# Patient Record
Sex: Female | Born: 1935
Health system: Southern US, Community
[De-identification: ages and names within clinical notes are randomized; demographics above are authoritative.]

## PROBLEM LIST (undated history)

## (undated) DIAGNOSIS — Z923 Personal history of irradiation: Secondary | ICD-10-CM

## (undated) DIAGNOSIS — E7800 Pure hypercholesterolemia, unspecified: Secondary | ICD-10-CM

## (undated) DIAGNOSIS — I10 Essential (primary) hypertension: Secondary | ICD-10-CM

## (undated) DIAGNOSIS — D649 Anemia, unspecified: Secondary | ICD-10-CM

## (undated) DIAGNOSIS — I5022 Chronic systolic (congestive) heart failure: Secondary | ICD-10-CM

## (undated) DIAGNOSIS — N200 Calculus of kidney: Secondary | ICD-10-CM

## (undated) DIAGNOSIS — K219 Gastro-esophageal reflux disease without esophagitis: Secondary | ICD-10-CM

## (undated) DIAGNOSIS — F419 Anxiety disorder, unspecified: Secondary | ICD-10-CM

## (undated) DIAGNOSIS — I340 Nonrheumatic mitral (valve) insufficiency: Secondary | ICD-10-CM

## (undated) DIAGNOSIS — M810 Age-related osteoporosis without current pathological fracture: Secondary | ICD-10-CM

## (undated) DIAGNOSIS — Z9889 Other specified postprocedural states: Secondary | ICD-10-CM

## (undated) DIAGNOSIS — R112 Nausea with vomiting, unspecified: Secondary | ICD-10-CM

## (undated) DIAGNOSIS — I219 Acute myocardial infarction, unspecified: Secondary | ICD-10-CM

## (undated) DIAGNOSIS — C50512 Malignant neoplasm of lower-outer quadrant of left female breast: Secondary | ICD-10-CM

## (undated) DIAGNOSIS — K279 Peptic ulcer, site unspecified, unspecified as acute or chronic, without hemorrhage or perforation: Secondary | ICD-10-CM

## (undated) DIAGNOSIS — N189 Chronic kidney disease, unspecified: Secondary | ICD-10-CM

## (undated) DIAGNOSIS — I2510 Atherosclerotic heart disease of native coronary artery without angina pectoris: Secondary | ICD-10-CM

## (undated) DIAGNOSIS — I4891 Unspecified atrial fibrillation: Secondary | ICD-10-CM

## (undated) HISTORY — DX: Essential (primary) hypertension: I10

## (undated) HISTORY — DX: Acute myocardial infarction, unspecified: I21.9

## (undated) HISTORY — DX: Pure hypercholesterolemia, unspecified: E78.00

## (undated) HISTORY — DX: Atherosclerotic heart disease of native coronary artery without angina pectoris: I25.10

## (undated) HISTORY — DX: Malignant neoplasm of lower-outer quadrant of left female breast: C50.512

## (undated) HISTORY — PX: CARDIAC CATHETERIZATION: SHX172

## (undated) HISTORY — DX: Age-related osteoporosis without current pathological fracture: M81.0

## (undated) HISTORY — DX: Anxiety disorder, unspecified: F41.9

## (undated) HISTORY — DX: Peptic ulcer, site unspecified, unspecified as acute or chronic, without hemorrhage or perforation: K27.9

## (undated) HISTORY — DX: Gastro-esophageal reflux disease without esophagitis: K21.9

---

## 2001-06-19 ENCOUNTER — Inpatient Hospital Stay (HOSPITAL_COMMUNITY): Admission: EM | Admit: 2001-06-19 | Discharge: 2001-06-21 | Payer: Self-pay | Admitting: Emergency Medicine

## 2001-06-19 ENCOUNTER — Encounter (INDEPENDENT_AMBULATORY_CARE_PROVIDER_SITE_OTHER): Payer: Self-pay | Admitting: Specialist

## 2001-06-19 ENCOUNTER — Encounter: Payer: Self-pay | Admitting: Emergency Medicine

## 2001-07-28 ENCOUNTER — Encounter: Payer: Self-pay | Admitting: Family Medicine

## 2001-07-28 ENCOUNTER — Encounter: Admission: RE | Admit: 2001-07-28 | Discharge: 2001-07-28 | Payer: Self-pay | Admitting: Family Medicine

## 2007-03-12 ENCOUNTER — Encounter: Admission: RE | Admit: 2007-03-12 | Discharge: 2007-03-12 | Payer: Medicare Other | Admitting: Family Medicine

## 2007-08-21 ENCOUNTER — Encounter: Payer: Self-pay | Admitting: Family Medicine

## 2007-08-21 ENCOUNTER — Ambulatory Visit (HOSPITAL_COMMUNITY): Admission: RE | Admit: 2007-08-21 | Discharge: 2007-08-21 | Payer: Self-pay | Admitting: Family Medicine

## 2007-08-21 ENCOUNTER — Ambulatory Visit: Payer: Self-pay | Admitting: Surgery

## 2008-04-19 ENCOUNTER — Encounter: Admission: RE | Admit: 2008-04-19 | Discharge: 2008-04-19 | Payer: Self-pay | Admitting: Gastroenterology

## 2010-10-20 NOTE — Procedures (Signed)
[place]. [facility]  Patient:    [name], [name] Visit Number: [id number] MRN: [id number]          Service Type: MED Location: [phone] Attending Physician:  Suszanne Conners Dictated by:   Jeryl Columbia, M.D. Proc. Date: 06/20/01 Admit Date:  06/19/2001 Discharge Date: 06/21/2001   CC:         Suszanne Conners, M.D.                           Procedure Report  PROCEDURE PERFORMED:  Esophagogastroduodenoscopy with biopsies.  ENDOSCOPIST:  Jeryl Columbia, M.D.  INDICATIONS FOR PROCEDURE:  Gastrointestinal bleeding, nondiagnostic colonoscopy.  Consent was signed prior to any premeds given after risks, benefits, methods, and options were thoroughly discussed with the patient and multiple family members.  ADDITIONAL MEDICINES FOR THIS PROCEDURE:  2 mg of Versed only.  DESCRIPTION OF PROCEDURE:  Video endoscope inserted by direct vision.  The esophagus was normal.  The esophagus was normal.  In the distal esophagus was a small hiatal hernia.  The scope passed into the stomach and advanced through a normal pylorus into the duodenum, all of which was deformed.  There was scarring from previous ulcer diseases and one linear active ulcer and possibly a small shallow one as well.  The scope passed easily around the C-loop to a normal second portion of the duodenum.  No blood was seen distally.  The scope was withdrawn back to the bulb and a good look there confirmed the above findings.  The scope was withdrawn back to the stomach and retroflexed.  High in the cardia, a hiatal hernia was confirmed.  The fundus, angularis, lesser and greater curve were all normal except for some mild gastritis.  The scope was straightened and straight visualization of the stomach did not reveal any additional findings except for some minimal antritis as well.  One biopsy of the antrum or the CLO test was obtained to rule out Helicobacter pylori.  The scope was then  slowly withdrawn again confirming the normal esophagus.  The scope was removed, the patient tolerated the procedure well.  There was no obvious immediate complication.  ENDOSCOPIC DIAGNOSIS: 1. Small hiatal hernia. 2. Minimal antritis, status post CLO biopsy, rule out Helicobacter pylori. 3. Deformed pylorus and bulb from previous ulcer disease with questionable    shallow active and linear and ulcers currently. 4. No blood seen on exam to a normal second portion of the duodenum.  PLAN: 1. Treat CLO if positive. 2. No aspirin or nonsteroidals long term. 3. Three months of pump inhibitors.  Happy to see back p.r.n. to recheck    CBCs, guaiacs and determine if small bowel follow-through is next which    I would do.  Would slowly advance diet.  Dr. Wynetta Emery is on call for me    this week.  Call for any questions or problems but hopefully the patient    can be discharged soon if CBC is stable and no signs of active bleeding. Dictated by:   Jeryl Columbia, M.D. Attending Physician:  Suszanne Conners DD:  06/20/01 TD:  06/23/01 Job: [id number]

## 2010-10-20 NOTE — Consult Note (Signed)
[place]. [facility]  Patient:    [name], [name] Visit Number: [id number] MRN: [id number]          Service Type: MED Location: [phone] Attending Physician:  Suszanne Conners Dictated by:   Jeryl Columbia, M.D. Proc. Date: 06/19/01 Admit Date:  06/19/2001 Discharge Date: 06/21/2001   CC:         Jeryl Columbia, M.D.  Suszanne Conners, M.D.   Consultation Report  REASON FOR CONSULTATION:  The patient was seen at the request of Dr. Suszanne Conners for guaiac positivity anemia.  She has seen some dark stools for about a week and has had increased weakness and dizziness.  She presented to the emergency room and found not only the blood but to have a hemoglobin of 6.7 and was admitted for transfusion and further workup and plan.  Other than some chronic constipation, she has really no GI symptoms.  She has not had any previous ulcers or any previous GI workup.  She will take an occasional aspirin but no other nonsteroidals.  PAST MEDICAL HISTORY:  Pertinent for some history of increased blood pressure in the past but no abdominal surgeries or previous problems.  MEDICATIONS:  None.  SOCIAL HISTORY:  Does not smoke or drink.  Does use some over-the-counter natural products as well as some periodic aspirins.  FAMILY HISTORY:  Negative for any ulcers, colon cancer, colon polyps.  ALLERGIES:  No medical allergies.  REVIEW OF SYSTEMS:  Pertinent for her having a long history of anemia.  No sickle cel or other causes of anemia in the family.  Initially thought possibly due to heavy periods thought possibly she had before but no recent blood donations or CBCs.  PHYSICAL EXAMINATION:  GENERAL:  No acute distress.  ABDOMEN:  Exam pertinent for abdomen being nontender and stool guaiac positive.  LABORATORY DATA:  BUN of 20.  Coagulases were okay.  MCV was normal.  Albumin 3.0.  Liver tests normal.  ASSESSMENT:  Gastrointestinal bleed and  anemia of questionable etiology.  PLAN:  Risks, benefits, methods, and options of colonoscopy and endoscopy versus upper GI and lower GI were discussed with the patient and the family. We will proceed tomorrow unless needed sooner p.r.n.  In the meantime, we will continue clear liquids.  Prep as written.  Agree with Protonix in the meantime.  Obviously, if nothing is found on these exams, we will need a small-bowel follow-through to be sure.Dictated by:   Jeryl Columbia, M.D.  Attending Physician:  Suszanne Conners DD:  06/19/01 TD:  06/21/01 Job: [id number]

## 2010-10-20 NOTE — Procedures (Signed)
[place]. [facility]  Patient:    DANETTA, ROXBURGH Visit Number: [id number] MRN: [id number]          Service Type: MED Location: [phone] Attending Physician:  Suszanne Conners Dictated by:   Jeryl Columbia, M.D. Proc. Date: 06/20/01 Admit Date:  06/19/2001 Discharge Date: 06/21/2001   CC:         Suszanne Conners, M.D.   Procedure Report  PROCEDURE: Colonoscopy with biopsy.  INDICATION:  Anemia, guaiac positivity.  Consent was signed after risks, benefits, methods, and options thoroughly discussed with the patient and most of the family members.  MEDICATIONS:  Demerol 60 mg, Versed 6 mg.  DESCRIPTION OF PROCEDURE:  Rectal inspection was pertinent for external hemorrhoids, small.  Digital exam was negative.  The pediatric video adjustable colonoscope was inserted and with moderate difficulty due to a tortuous, looping colon was able to be advanced to the cecum.  This required rolling her on her back and multiple abdominal pressures.  The cecum was identified by the appendiceal orifice and the ileocecal valve.  No signs of bleeding were seen on insertion.  The scope was inserted a short way into the terminal ileum, which was normal.  Photo documentation was obtained, and no blood was seen coming from above.  On insertion no abnormality was seen.  The scope was slowly withdrawn.  The prep was adequate.  There was some liquid stool that required washing and suctioning.  On slow withdrawal through the colon, the cecum, ascending, and transverse were normal.  In the mid-descending, a tiny questionable polyp was seen and was cold biopsied x 2. The scope was slowly withdrawn.  There were some rare early left-sided diverticula.  In the distal sigmoid, two tiny hyperplastic-appearing polyps were seen and were also cold biopsied x 1 and put in the same container.  The scope was withdrawn back to the rectum and retroflexed, pertinent for some small  internal hemorrhoids.  The scope was straightened, air was suctioned, and the scope removed.  The patient tolerated the procedure adequately.  There was no obvious immediate complication.  ENDOSCOPIC DIAGNOSES: 1. Internal-external hemorrhoids. 2. Rare early left-sided diverticula. 3. Questionable hyperplastic-appearing distal sigmoid polyps, cold biopsied. 4. Tiny to small mid-descending polyp, cold biopsied. 5. Otherwise within normal limits to the cecum, a quick look in the terminal    ileum without any blood seen.  PLAN:  Await pathology to determine future colonic screening and otherwise continue workup with an EGD. Dictated by:   Jeryl Columbia, M.D. Attending Physician:  Suszanne Conners DD:  06/20/01 TD:  06/23/01 Job: [id number]

## 2010-10-20 NOTE — Discharge Summary (Signed)
[place]. [facility]  Patient:    [name], [name] Visit Number: [id number] MRN: [id number]          Service Type: MED Location: [phone] Attending Physician:  Suszanne Conners Dictated by:   Sharene Butters, M.D. Admit Date:  06/19/2001 Discharge Date: 06/21/2001   CC:         Jeryl Columbia, M.D.  Suszanne Conners, M.D.  Don Broach. Carlis Abbott, M.D.   Discharge Summary  DISCHARGE DIAGNOSES: 1. Gastrointestinal bleed. 2. Endoscopy demonstrated old peptic ulcer disease. 3. Colonic polyps. 4. History of high blood pressure. 5. History of hormone replacement therapy.  HISTORY OF PRESENT ILLNESS:  [name] is a 76 year old black married female former patient of Dr. Jeanann Lewandowsky admitted for spells of rapid heartbeat and weakness in the chest for four days occurring with movement and chest sensation with lifting.  Denied shortness of breath, dizziness, headache. Stools were darker on the week of admission.  Patient attributing to restart of multivitamins.  Has been more constipated for months but no melena until the week of admission.  No nausea, reflux symptoms.  Seen in Urgent Care January 14 with allegedly normal EKG.  No other work-up done.  PAST MEDICAL HISTORY: 1. Hypertension in the past.  Briefly on ______ through Dr. Carlis Abbott.  Not seen    by Dr. Carlis Abbott in three years. 2. G4, P4, menopausal x eight years.  Took HRT briefly.  Discontinued five    years prior to admission. 3. Benign breast biopsy.  MEDICATIONS:  Low dose ASA recommended by Urgent Care.  Multiple natural products.  Denies NSAIDs.  MEDICATION ALLERGIES:  None.  FAMILY HISTORY:  Father deceased of CVA age 72.  Mother died of cancer of the pancreas.  SOCIAL HISTORY:  Former [employer].  Does not drink alcohol or smoke cigarettes.  Lives with husband.  Grown children.  Minimal caffeine.  REVIEW OF SYSTEMS:  No recent weight or appetite change, night  sweats, abdominal or chest pain.  PHYSICAL EXAMINATION:  See admission History and Physical.  LABORATORY DATA:  Admission hemoglobin 6.7, white count 7.  ABG stable.  TSH 5, then 1.9.  H. pylori pending.  CPK with MB negative x 3.  Glucose 134, then 120.  Clotting function normal.  Folic 13 which is normal.  B-12 normal, 367.  RADIOGRAPHIC DATA:  Chest x-ray suggested COPD.  EKG:  Normal sinus rhythm.  T wave abnormality consistent with ischemia (later thought secondary to anemia).  HOSPITAL COURSE:  [name] was admitted for evaluation and treatment of her cardiac symptoms and anemia thought secondary to a GI bleed.  She received two units of packed cells on admission and was begun on Protonix.  She underwent two procedures:  EGD, showing old bulb ulcer by Dr. Watt Climes, and colonoscopy, demonstrating polyps.  Diet was advanced after procedures and she was able to eat and ambulate without dizziness.  PROCEDURES: 1. Endoscopy. 2. Colonoscopy.  DISPOSITION:  She was discharged in satisfactory condition to the care of her husband and grown children on Protonix 40 and Chromagen.  Pending at the time of discharge was H. pylori.  If positive, ATB would be added for the last two weeks of her Protonix therapy.  FOLLOW-UP:  With Dr. Shelbie Proctor seven to 10 days after admission, with Dr. Watt Climes one month after admission.  The family was also concerned about findings on chest x-ray with relatively minimal symptoms.  Will need repeat.  DISCHARGE MEDICATIONS:  Protonix  and Chromagen. Dictated by:   Sharene Butters, M.D. Attending Physician:  Suszanne Conners DD:  06/21/01 TD:  06/23/01 Job: [id number]

## 2011-02-01 ENCOUNTER — Emergency Department (HOSPITAL_COMMUNITY): Payer: Medicare Other

## 2011-02-01 ENCOUNTER — Encounter: Payer: Self-pay | Admitting: Family Medicine

## 2011-02-01 ENCOUNTER — Observation Stay (HOSPITAL_COMMUNITY)
Admission: EM | Admit: 2011-02-01 | Discharge: 2011-02-02 | Disposition: A | Discharge status: Home / Self Care | Payer: Medicare Other | Source: Ambulatory Visit | Attending: Family Medicine | Admitting: Family Medicine

## 2011-02-01 DIAGNOSIS — K219 Gastro-esophageal reflux disease without esophagitis: Secondary | ICD-10-CM | POA: Insufficient documentation

## 2011-02-01 DIAGNOSIS — R0789 Other chest pain: Secondary | ICD-10-CM

## 2011-02-01 DIAGNOSIS — I10 Essential (primary) hypertension: Secondary | ICD-10-CM | POA: Insufficient documentation

## 2011-02-01 DIAGNOSIS — D649 Anemia, unspecified: Secondary | ICD-10-CM | POA: Insufficient documentation

## 2011-02-01 LAB — LIPID PANEL
Cholesterol: 251 mg/dL — ABNORMAL HIGH (ref 0–200)
HDL: 65 mg/dL (ref >39)
Total CHOL/HDL Ratio: 3.9 RATIO
Triglycerides: 94 mg/dL (ref <150)

## 2011-02-01 LAB — POCT I-STAT TROPONIN I: Troponin i, poc: 0.01 ng/mL (ref 0.00–0.08)

## 2011-02-01 LAB — CARDIAC PANEL(CRET KIN+CKTOT+MB+TROPI)
CK, MB: 3.2 ng/mL (ref 0.3–4.0)
Relative Index: 1.9 (ref 0.0–2.5)
Total CK: 171 U/L (ref 7–177)

## 2011-02-01 LAB — CBC
HCT: 34.2 % — ABNORMAL LOW (ref 36.0–46.0)
MCH: 29.2 pg (ref 26.0–34.0)
MCV: 88.4 fL (ref 78.0–100.0)
Platelets: 190 10*3/uL (ref 150–400)
RBC: 3.87 MIL/uL (ref 3.87–5.11)
WBC: 9.5 10*3/uL (ref 4.0–10.5)

## 2011-02-01 LAB — URINALYSIS, ROUTINE W REFLEX MICROSCOPIC
Bilirubin Urine: NEGATIVE
Glucose, UA: NEGATIVE mg/dL
Hgb urine dipstick: NEGATIVE
Specific Gravity, Urine: 1.008 (ref 1.005–1.030)
Urobilinogen, UA: 0.2 mg/dL (ref 0.0–1.0)
pH: 8 (ref 5.0–8.0)

## 2011-02-01 LAB — BASIC METABOLIC PANEL
BUN: 11 mg/dL (ref 6–23)
CO2: 26 mEq/L (ref 19–32)
Glucose, Bld: 93 mg/dL (ref 70–99)
Potassium: 3.5 mEq/L (ref 3.5–5.1)
Sodium: 142 mEq/L (ref 135–145)

## 2011-02-01 LAB — DIFFERENTIAL
Eosinophils Absolute: 0.1 10*3/uL (ref 0.0–0.7)
Eosinophils Relative: 1 % (ref 0–5)
Lymphocytes Relative: 16 % (ref 12–46)
Lymphs Abs: 1.6 10*3/uL (ref 0.7–4.0)
Monocytes Relative: 10 % (ref 3–12)
Neutrophils Relative %: 72 % (ref 43–77)

## 2011-02-01 LAB — CK TOTAL AND CKMB (NOT AT ARMC): Total CK: 161 U/L (ref 7–177)

## 2011-02-01 LAB — TROPONIN I: Troponin I: <0.3 ng/mL (ref <0.30)

## 2011-02-01 NOTE — H&P (Signed)
[facility] Admission History and Physical  Patient name: [name] record number: [id number] Date of birth: [birth year] Age: 76 y.o. Gender: female  Primary Care Provider: Hayden Rasmussen., MD  Chief Complaint: chest pain History of Present Illness: [name] is a 76 y.o. year old female with h/o hypertension who presents with complaint of chest pain that occurred yesterday at [facility]. She experienced some "gas pain" in the epigastric region after eating. Pain radiated to her throat. In addition to this, she also describes having some chest pain with breathing. This all lasted until 11pm. She took four aspirins (81mg ), gas X and prilosec, with relief of symptoms. She describes waking up this morning and feeling soreness in the chest area. She went to [place] to see Dr. Darron Doom, where an EKG showed T wave depression, new compared to an old EKG.  She denies any nausea or vomiting associated with the chest pain. No shortness of breath or difficulty breathing. Denies any diaphoresis, fevers or chills. Denies any trauma to the chest area.   Past Medical History: - Hypertension - hyperlipidemia Past Surgical History: - none Social History: Pt is retired. Has four children who live in the area.  Denies any tobacco use, alcohol or illicit drug use  Family History: Father: died of stroke Hypertension in family Allergies: NKDA  Medications:  - fish oil - garlic tabs - multivitamins  Review Of Systems: Per HPI with the following additions: none Otherwise 12 point review of systems was performed and was unremarkable.  Physical Exam: Pulse: 74  Blood Pressure: 177/76 RR: 16   O2:100  on RA Temp: 98.6  General: alert, cooperative and no distress HEENT: PERRLA, extra ocular movement intact and oropharynx clear, no lesions Heart: S1, S2 normal, no murmur, rub or gallop, regular rate and rhythm, no tenderness to palpation of chest. Lungs: clear to  auscultation, no wheezes or rales and unlabored breathing Abdomen: abdomen is soft without significant tenderness, masses, organomegaly or guarding. No epigastric tenderness to palpation.  Extremities: extremities normal, atraumatic, no cyanosis or edema Skin:no rashes Neurology: normal without focal findings, mental status, speech normal, alert and oriented x3 and PERLA  Labs and Imaging: Lab Results  Component Value Date/Time   NA 142 02/01/2011 12:15 PM   K 3.5 02/01/2011 12:15 PM   CL 107 02/01/2011 12:15 PM   CO2 26 02/01/2011 12:15 PM   BUN 11 02/01/2011 12:15 PM   CREATININE 0.86 02/01/2011 12:15 PM   GLUCOSE 93 02/01/2011 12:15 PM  Ca: 10.3 Lab Results  Component Value Date   WBC 9.5 02/01/2011   HGB 11.3* 02/01/2011   HCT 34.2* 02/01/2011   MCV 88.4 02/01/2011   PLT 190 02/01/2011   EKG: LVH, T wave depression in V3, V4, V5 CXR: no acute pulmonary process. Coarse calcifications over liver unchanged from previous CT scan.  POC troponin: 0.03 and 0.01 2 hours apart. UA: sg: 1.008, wnl.    Assessment and Plan: [name] is a 76 y.o. year old female presenting with chest pain. 1. Chest pain: atypical in nature. Will rule out ACS with three sets of cardiac enzymes and a repeat EKG in the morning. Start aspirin 81mg  daily. Pleuritic nature of chest pain could have suggested pneumonia or pericarditis, but EKG changes are not consistent with pericarditis and chest X-ray did not show any acute process such as pneumonia. There could also be a component of GERD with the chest pain and we will start protonix.  Will get risk stratification labs: lipid profile, HbA1C and TSH. 2. Hypertension: patient denies taking any hypertensive medications. BP's in the [id number] systolic. Will put patient on metoprolol 2.5mg  IV [id number] for a systolic above [id number] for now.  2. FEN/GI: low sodium diet 3. Prophylaxis: heparin 5000u tid 4. Disposition: will admit to tele floor and await further improvement.

## 2011-02-01 NOTE — H&P (Signed)
[name] is an 76 y.o. female.   Chief Complaint: chest pain HPI: 76 yo F with PMH of hypertension and questionable hyperlipidemia who presents with 1 day history of chest pain.  Patient states she began having midsternal burning the evening prior to admission.  This lasted until she went to bed, was able to sleep well.  Took 4 ASA, Gax-X, and Prilosec prior to retiring for the night.  Awoke this AM without any further pain, went to PCP to be evaluated where EKG showed new ST depression and flipped T waves in several leads.  Sent to ED for further workup and possible admission.  She denies any chest pain since yesterday evening.  No shortness of breath, nausea, vomiting, dyspnea, fevers or chills.  Does not smoke nor have diabetes.  No family history of cardiac issues.  For PMH, Social, Family, Medications please see excellent R1 note.       No current facility-administered medications on file as of 02/01/2011.   No current outpatient prescriptions on file as of 02/01/2011.    Results for orders placed during the hospital encounter of 02/01/11 (from the past 48 hour(s))  URINALYSIS, ROUTINE W REFLEX MICROSCOPIC     Status: Normal   Collection Time   02/01/11 11:58 AM      Component Value Range Comment   Color, Urine YELLOW  YELLOW     Appearance CLEAR  CLEAR     Specific Gravity, Urine 1.008  1.005 - 1.030     pH 8.0  5.0 - 8.0     Glucose, UA NEGATIVE  NEGATIVE (mg/dL)    Hgb urine dipstick NEGATIVE  NEGATIVE     Bilirubin Urine NEGATIVE  NEGATIVE     Ketones, ur NEGATIVE  NEGATIVE (mg/dL)    Protein, ur NEGATIVE  NEGATIVE (mg/dL)    Urobilinogen, UA 0.2  0.0 - 1.0 (mg/dL)    Nitrite NEGATIVE  NEGATIVE     Leukocytes, UA NEGATIVE  NEGATIVE  MICROSCOPIC NOT DONE ON URINES WITH NEGATIVE PROTEIN, BLOOD, LEUKOCYTES, NITRITE, OR GLUCOSE <1000 mg/dL.  DIFFERENTIAL     Status: Normal   Collection Time   02/01/11 12:15 PM      Component Value Range Comment   Neutrophils Relative 72  43 -  77 (%)    Neutro Abs 6.8  1.7 - 7.7 (K/uL)    Lymphocytes Relative 16  12 - 46 (%)    Lymphs Abs 1.6  0.7 - 4.0 (K/uL)    Monocytes Relative 10  3 - 12 (%)    Monocytes Absolute 1.0  0.1 - 1.0 (K/uL)    Eosinophils Relative 1  0 - 5 (%)    Eosinophils Absolute 0.1  0.0 - 0.7 (K/uL)    Basophils Relative 0  0 - 1 (%)    Basophils Absolute 0.0  0.0 - 0.1 (K/uL)   CBC     Status: Abnormal   Collection Time   02/01/11 12:15 PM      Component Value Range Comment   WBC 9.5  4.0 - 10.5 (K/uL)    RBC 3.87  3.87 - 5.11 (MIL/uL)    Hemoglobin 11.3 (*) 12.0 - 15.0 (g/dL)    HCT 34.2 (*) 36.0 - 46.0 (%)    MCV 88.4  78.0 - 100.0 (fL)    MCH 29.2  26.0 - 34.0 (pg)    MCHC 33.0  30.0 - 36.0 (g/dL)    RDW 13.0  11.5 - 15.5 (%)  Platelets 190  150 - 400 (K/uL)   BASIC METABOLIC PANEL     Status: Normal   Collection Time   02/01/11 12:15 PM      Component Value Range Comment   Sodium 142  135 - 145 (mEq/L)    Potassium 3.5  3.5 - 5.1 (mEq/L)    Chloride 107  96 - 112 (mEq/L)    CO2 26  19 - 32 (mEq/L)    Glucose, Bld 93  70 - 99 (mg/dL)    BUN 11  6 - 23 (mg/dL)    Creatinine, Ser 0.86  0.50 - 1.10 (mg/dL)    Calcium 10.3  8.4 - 10.5 (mg/dL)    GFR calc non Af Amer >60  >60 (mL/min)    GFR calc Af Amer >60  >60 (mL/min)   POCT I-STAT TROPONIN I     Status: Normal   Collection Time   02/01/11 12:25 PM      Component Value Range Comment   Troponin i, poc 0.03  0.00 - 0.08 (ng/mL)    Comment 3            POCT I-STAT TROPONIN I     Status: Normal   Collection Time   02/01/11  2:34 PM      Component Value Range Comment   Troponin i, poc 0.01  0.00 - 0.08 (ng/mL)    Comment 3            CK TOTAL AND CKMB     Status: Normal   Collection Time   02/01/11  4:04 PM      Component Value Range Comment   Total CK 161  7 - 177 (U/L)    CK, MB 3.6  0.3 - 4.0 (ng/mL)    Relative Index 2.2  0.0 - 2.5    TROPONIN I     Status: Normal   Collection Time   02/01/11  4:04 PM      Component Value Range  Comment   Troponin I <0.30  <0.30 (ng/mL)   LIPID PANEL     Status: Abnormal   Collection Time   02/01/11  4:09 PM      Component Value Range Comment   Cholesterol 251 (*) 0 - 200 (mg/dL)    Triglycerides 94  <150 (mg/dL)    HDL 65  >39 (mg/dL)    Total CHOL/HDL Ratio 3.9      VLDL 19  0 - 40 (mg/dL)    LDL Cholesterol 167 (*) 0 - 99 (mg/dL)     POC troponin 0.03 and 0.01  ROS 12 point ROS completed and negative except per HPI  There were no vitals taken for this visit. Physical Exam  T98.6, HR 74, BP 177/76, RR 16, O2 100% on RA Gen:  Alert, cooperative patient who appears younger than stated age in no acute distress.  Vital signs reviewed. HEENT:  [place]/AT.  EOMI, PERRL.  MMM, tonsils non-erythematous, non-edematous.  External ears WNL, Bilateral TM's normal without retraction, redness or bulging.  Neck: No masses or thyromegaly or limitation in range of motion.  No cervical lymphadenopathy. Cardiac:  Regular rate and rhythm without murmur auscultated.  Good S1/S2. Pulm:  Clear to auscultation bilaterally with good air movement.  No wheezes or rales noted.   Abd:  Soft/nondistended/nontender.  Good bowel sounds throughout all four quadrants.  No masses noted.  Ext:  No clubbing/cyanosis/erythema.  No edema noted bilateral lower extremities.   Skin - no sores  or suspicious lesions or rashes or color changes Neuro Alert and oriented to person, place, and date.  CN II-XII intact.  No focal deficits noted.     Assessment/Plan 76 yo F with chest pain, admitted to rule out ACS: 1.  Chest pain:  Atypical.   Less likely cardiac chest pain, though with history of HTN and questionable HLD (she states she takes red yeast rice) will obtain several sets of cardiac enzymes.  Will repeat EKG in AM.  CXR completely within normal limits, less likely PNA (no fevers, WBC WNL).  Plan to obtain fasting lipid panel to fully risk stratify patient. Obtain A1C.  Start Protonix for suspected GERD. 2.  HTN:   Start IV metoprolol currently for any SPB > 170.  Plan to switch to oral agent in AM after getting better feel for baseline blood pressure. 3.  HLD:  Questionable history.  Will obtain fasting lipid panel. 4.  FEN/GI:  Heart healthy diet 5.  PPX:  Heparin 5000 units 6.  Dispo:  Hopeful for short hospital stay  Annabell Sabal MD [phone] 02/01/2011, 5:33 PM

## 2011-02-02 LAB — COMPREHENSIVE METABOLIC PANEL
AST: 15 U/L (ref 0–37)
Albumin: 3.3 g/dL — ABNORMAL LOW (ref 3.5–5.2)
Alkaline Phosphatase: 56 U/L (ref 39–117)
BUN: 14 mg/dL (ref 6–23)
CO2: 22 mEq/L (ref 19–32)
Chloride: 106 mEq/L (ref 96–112)
Potassium: 3.7 mEq/L (ref 3.5–5.1)
Total Bilirubin: 0.5 mg/dL (ref 0.3–1.2)

## 2011-02-02 LAB — CBC
HCT: 35.3 % — ABNORMAL LOW (ref 36.0–46.0)
MCH: 29.7 pg (ref 26.0–34.0)
MCV: 88.9 fL (ref 78.0–100.0)
RDW: 12.9 % (ref 11.5–15.5)
WBC: 9.9 10*3/uL (ref 4.0–10.5)

## 2011-02-02 LAB — CARDIAC PANEL(CRET KIN+CKTOT+MB+TROPI)
Relative Index: 2.2 (ref 0.0–2.5)
Troponin I: <0.3 ng/mL (ref <0.30)

## 2011-02-03 LAB — URINE CULTURE: Colony Count: 25000

## 2011-02-08 NOTE — Discharge Summary (Signed)
NAMEJAYMES, [name] NO.:  [id number]  MEDICAL RECORD NO.:  [id number]  LOCATION:  [facility]                         FACILITY:  [facility]  PHYSICIAN:  Talbert Cage, M.D.DATE OF BIRTH:  19-Oct-1934  DATE OF ADMISSION:  02/01/2011 DATE OF DISCHARGE:  02/02/2011                              DISCHARGE SUMMARY   PRIMARY CARE PROVIDER:  Santiago Glad L. Darron Doom, MD at [facility].  DISCHARGE DIAGNOSES: 1. Chest pain atypical. 2. Hypertension. 3. Anemia. 4. Gastroesophageal reflux disease.  DISCHARGE MEDICATIONS: 1. Aspirin 81 mg tab by mouth daily. 2. Hydrochlorothiazide 25 mg by mouth daily. 3. Nitroglycerin 0.4 mg sublingual to use in case of chest pain. 4. Omeprazole 20 mg daily. 5. Fish oil 1 capsule by mouth daily. 6. Multivitamin 1 tablet by mouth daily.  CONSULTS:  None.  PROCEDURES: 1. EKG showing LVH and T-wave depression in leads V3, V4, V5. 2. Chest x-ray, no acute findings.  LABORATORY DATA:  Lipid profile, cholesterol 251, triglyceride 94, HDL 65, LDL 167, VLDL 19.  Cardiac enzymes and troponins negative x3.  CK-MB #1 is 3.6, #2 is 3.2, and #3 is 3.3.  Urinalysis within normal limits. HbA1c 6.0.  LABORATORY DATA ON DISCHARGE:  CBC, WBC 9.9, hemoglobin 11.8, hematocrit 35.3, platelet count 218.  Complete metabolic panel, sodium [id number], potassium 3.7, chloride 106, CO2 of 22, glucose 100, BUN 14, creatinine 0.78, total bilirubin 0.5, alk phos 56, AST 15, ALT 17, total protein 7.1, albumin 3.3, calcium 10.3.  BRIEF HOSPITAL COURSE:  A 77 year old female with history of hypertension who presented with chest pain and high blood pressure. Chest pain started the night prior to admission and was epigastric relieved by a combination of aspirin, Gas-X, and Prilosec.  There was also a component of pleuritic chest pain. 1. Chest pain.  The patient was sent to hospital by primary care     provider due to T-wave inversion in leads V3, V4, V5, which were     not present  on previous EKG.  The patient had three sets of     negative cardiac enzymes.  T-wave inversions were still present on     repeat EKG.  The patient did not have any pain during     hospitalization.  The patient's chest x-ray did not show any acute     process, such as pneumonia.  The patient did not complain of any     shortness of breath.  The patient's vitals were stable during     hospitalization except for elevated blood pressures in the A999333     systolic.  Given persistent T-wave inversions and presence of left     ventricular hypertrophy on EKG, the patient was scheduled to have     outpatient nuclear stress test and echo with [facility].  The patient was discharged with aspirin and     nitroglycerin. 2. Hypertension.  The patient's blood pressure was elevated to 203/84.     Given hydralazine and metoprolol 2.5 mg IV, was decreased to 99991111     systolic.  The patient was discharged on hydrochlorothiazide 25 mg     p.o. daily, and close  followup with primary care provider. 3. Anemia.  The patient was found to have hemoglobin at 11.3 on     admission.  The patient to follow up with PCP. 4. GERD.  Chest pain could have a component of gastric reflux given     description of pain after eating and relieved with Prilosec.  The     patient was discharged on omeprazole 20 mg daily.  DISCHARGE INSTRUCTIONS: 1. Heart healthy, low-sodium diet. 2. Return to hospital or clinic if any more chest pain, difficulty     breathing.  FOLLOWUP APPOINTMENTS: 1. [place] [facility] consult for     nuclear stress test and 2D echo. 2. Maebelle Munroe. Darron Doom, MD at [facility]. 3. To followup HbA1c. 4. Hypertension. 5. EKG.  DISCHARGE CONDITION:  The patient was discharged home in stable medical condition.    ______________________________ Liam Graham, MD   ______________________________ Talbert Cage, M.D.    SL/MEDQ  D:   02/03/2011  T:  02/03/2011  Job:  [id number]  cc:   [facility] Maebelle Munroe. Darron Doom, M.D.  Electronically Signed by Liam Graham MD on 02/04/2011 04:23:49 PM Electronically Signed by Talbert Cage M.D. on 02/08/2011 02:58:46 PM

## 2011-02-08 NOTE — H&P (Signed)
NAMENIEVE, STITELY NO.:  [id number]  MEDICAL RECORD NO.:  [id number]  LOCATION:  [facility]                         FACILITY:  [facility]  PHYSICIAN:  [name], M.D.DATE OF BIRTH:  [birth year]  DATE OF ADMISSION:  02/01/2011 DATE OF DISCHARGE:                             HISTORY & PHYSICAL   PRIMARY CARE PROVIDER:  Santiago Glad L. Darron Doom, MD  CHIEF COMPLAINT:  Chest pain.  HISTORY OF PRESENT ILLNESS:  [name] is 76 year old female with history of hypertension who presents with complaint of chest pain that occurred yesterday at 7 p.m.  She experienced some gas pains in the epigastric region after eating, pain radiated to her throat.  In addition to this, she also describes having some chest pain with breathing.  This all lasted until about 11 p.m.  She took four aspirin 81 mg each, Gas-X, and Prilosec with relief of symptoms.  She describes waking up this morning and feeling soreness in the chest area.  She went to [place] to see Dr. Darron [name] where an EKG showed T-wave depression, new compared to an old EKG.  She denies any nausea or vomiting associated with the chest pain.  No shortness of breath or difficulty breathing. Denies any diaphoresis, fevers, or chills.  Denies any trauma to the chest area.  PAST MEDICAL HISTORY: 1. Hypertension. 2. Hyperlipidemia.  PAST SURGICAL HISTORY:  None.  SOCIAL HISTORY:  The patient is retired, has 4 children who live in the area.  Denies any tobacco use, alcohol, or illicit drug use.  FAMILY HISTORY:  Father died of stroke.  Hypertension in the family.  ALLERGIES:  No known drug allergies.  MEDICATIONS: 1. Fish oil. 2. Garlic tabs. 3. Multivitamins.  REVIEW OF SYSTEMS:  Negative except per HPI.  PHYSICAL EXAMINATION:  VITAL SIGNS:  Heart rate 74, blood pressure 177/76, respiratory rate 16, O2 100% on room air, temperature 98.6. GENERAL:  Alert, cooperative and in no distress. HEENT:  Pupils equal, round, and  reactive to light and accommodation. Extraocular movement intact and oropharynx clear.  No lesions. HEART:  S1, S2 normal.  No murmur, rub, or gallop.  Regular rate and rhythm.  No tenderness to palpation of the chest wall. LUNGS:  Clear to auscultation.  No wheezes or rales and unlabored breathing. ABDOMEN:  Positive bowel sounds.  Abdomen, soft and nontender.  No masses, no organomegaly or guarding.  No epigastric tenderness to palpation. EXTREMITIES:  Normal, atraumatic.  No cyanosis or edema. SKIN:  No rashes. NEUROLOGY:  Normal without focal findings. MENTAL STATUS:  Speech normal and alert and oriented x3.  LABS AND IMAGING:  Sodium 142, potassium 3.5, chloride 107, CO2 of 26, BUN 11, creatinine 0.86, glucose 93, calcium 10.3.  WBC 9.5, hemoglobin 11.3, hematocrit 34.2, MCV 88.4, platelets 190.  EKG showed LVH, T-wave depression in V3, V4, V5.  Chest x-ray, no acute pulmonary process, coarse calcifications over liver unchanged from previous CT scan.  Point of care troponin 0.03 and 0.01 two hours apart.  Urinalysis, specific gravity 1.008 and within normal limits.  ASSESSMENT AND PLAN:  [name] is a 76 year old female presenting with chest pain. 1. Chest pain, atypical in nature, we will  rule out acute coronary     syndrome with three sets of cardiac enzymes and a repeat EKG in the     morning.  Start aspirin 81 mg daily, pleuritic nature of chest pain     could have suggested pneumonia or pericarditis, but EKG changes     were not consistent with pericarditis and  the chest x-ray did not     show any acute process such as pneumonia.  There could also be a     component of gastroesophageal reflux disease with the chest pain     and we will start Protonix.  We will get risk stratification labs     such as a lipid profile, hemoglobin A1c, and TSH.  We will also     check CBC and CMET in the morning. 2. Hypertension.  The patient denies taking any hypertensive      medications.  Blood pressure is in the 123XX123 systolic in the     emergency department.  We will put patient on metoprolol 2.5 mg IV     to titrate for systolic above 123XX123 for now. 3. Fluids, electrolytes, and nutrition and Gastrointestinal.  Low-     sodium diet. 4. Prophylaxis.  Heparin 5000 units t.i.d.  DISPOSITION:  We will admit to the tele floor and await further improvement.    ______________________________ Mary Graham, MD   ______________________________ [name], M.D.    SL/MEDQ  D:  02/01/2011  T:  02/01/2011  Job:  [id number]  Electronically Signed by Mary Graham MD on 02/03/2011 02:07:10 PM Electronically Signed by [name] M.D. on 02/08/2011 02:37:43 PM

## 2011-06-29 ENCOUNTER — Encounter: Payer: Self-pay | Admitting: Family Medicine

## 2011-06-29 DIAGNOSIS — K219 Gastro-esophageal reflux disease without esophagitis: Secondary | ICD-10-CM

## 2011-06-29 DIAGNOSIS — I10 Essential (primary) hypertension: Secondary | ICD-10-CM | POA: Insufficient documentation

## 2011-07-06 ENCOUNTER — Telehealth: Payer: Self-pay

## 2011-07-06 NOTE — Telephone Encounter (Signed)
Advised pt that no phone message of why she was called and did know why.  She does have an appt on Monday and that's what it probably a reminder call.

## 2011-07-06 NOTE — Telephone Encounter (Signed)
.  [facility] PT RECEIVED A CALL YESTERDAY AND SHE ISN'T SURE WHY HAVE AN APPT WITH DR Darron Doom ON Monday AND NOT SURE IF IT HAD ANYTHING TO DO WITH IT PLEASE CALL PT AT [phone]

## 2011-07-09 ENCOUNTER — Encounter: Payer: Self-pay | Admitting: Family Medicine

## 2011-07-09 ENCOUNTER — Ambulatory Visit (INDEPENDENT_AMBULATORY_CARE_PROVIDER_SITE_OTHER): Payer: Medicare Other | Admitting: Family Medicine

## 2011-07-09 VITALS — BP 187/78 | HR 64 | Temp 97.0°F | Resp 18 | Ht 64.0 in | Wt 184.0 lb

## 2011-07-09 DIAGNOSIS — F411 Generalized anxiety disorder: Secondary | ICD-10-CM

## 2011-07-09 DIAGNOSIS — F419 Anxiety disorder, unspecified: Secondary | ICD-10-CM | POA: Insufficient documentation

## 2011-07-09 DIAGNOSIS — I10 Essential (primary) hypertension: Secondary | ICD-10-CM

## 2011-07-09 DIAGNOSIS — H6120 Impacted cerumen, unspecified ear: Secondary | ICD-10-CM

## 2011-07-09 DIAGNOSIS — K279 Peptic ulcer, site unspecified, unspecified as acute or chronic, without hemorrhage or perforation: Secondary | ICD-10-CM | POA: Insufficient documentation

## 2011-07-09 LAB — LIPID PANEL
HDL: 59 mg/dL (ref >39)
Triglycerides: 113 mg/dL (ref <150)

## 2011-07-09 LAB — COMPREHENSIVE METABOLIC PANEL
BUN: 15 mg/dL (ref 6–23)
CO2: 25 mEq/L (ref 19–32)
Creat: 0.92 mg/dL (ref 0.50–1.10)
Glucose, Bld: 78 mg/dL (ref 70–99)
Total Bilirubin: 0.7 mg/dL (ref 0.3–1.2)

## 2011-07-09 LAB — TSH: TSH: 1.615 u[IU]/mL (ref 0.350–4.500)

## 2011-07-09 MED ORDER — NEOMYCIN-POLYMYXIN-HC 3.5-10000-1 OT SOLN: 10.0000 [drp] | Freq: Two times a day (BID) | OTIC | Status: DC

## 2011-07-09 MED ORDER — HYDROCHLOROTHIAZIDE 25 MG PO TABS: 25.0000 mg | ORAL_TABLET | Freq: Every day | ORAL | Status: DC

## 2011-07-09 NOTE — Progress Notes (Signed)
  Subjective:    Patient ID: [name], female    DOB: 04/14/1935, 77 y.o.   MRN: [id number]  HPI Presents for follow up of hypertension.  Ran out of medications last Wenesday and needs refills.  Patient denies concerns with her anxiety.  Also requests for ears to be rinsed out.  Review of Systems  Constitutional: Negative.   HENT:       Ear fullness  Eyes: Negative.   Respiratory: Negative.   Cardiovascular: Negative.        Objective:   Physical Exam  Constitutional: She appears well-developed and well-nourished. Distressed: anxious appearing.  HENT:  Right Ear: Right ear foreign body: IAC with cerumen impaction.  Left Ear: Left ear foreign body: IAC with cerumen impaction.  Cardiovascular: Normal rate, regular rhythm and normal heart sounds.   Pulmonary/Chest: Effort normal and breath sounds normal.  Neurological: She is alert.  Skin: Skin is warm.          Assessment & Plan:   1. HTN (hypertension)  Lipid panel, Comprehensive metabolic panel, TSH; refill HCTZ 25mg  daily.  Follow up 6 weeks for BP check  2. Anxiety  Pt declines medication at this time  3. Cerumen impaction  neomycin-polymyxin-hydrocortisone (CORTISPORIN) otic solution; s/p partial H2) and manual disimpaction   Follow up 6 weeks.

## 2011-07-09 NOTE — Patient Instructions (Signed)
I'll call you when your lab work is back.  Take Hydrochorathiazide1 tablet daily.  I would like to see you back in 1 month to recheck your blood pressure.  Try to check your blood pressure once weekly at a local pharmacy.  Please call with any questions.  Thank you!

## 2011-07-10 ENCOUNTER — Telehealth: Payer: Self-pay | Admitting: *Deleted

## 2011-07-10 DIAGNOSIS — H6120 Impacted cerumen, unspecified ear: Secondary | ICD-10-CM

## 2011-07-10 MED ORDER — NEOMYCIN-POLYMYXIN-HC 3.5-10000-1 OT SOLN: 10.0000 [drp] | Freq: Two times a day (BID) | OTIC | Status: AC

## 2011-07-10 NOTE — Telephone Encounter (Signed)
.  [facility]     PT ASKING IF DR RICHTER MEANT TO CALL HER IN AN EAR DROP AT PHARMACY OR IS SHE SUPPOSE TO DO OVER THE COUNTER DROP,SHE WAS GIVEN INSTRUCTIONS OF HOW TO USE DROP BUT NO RX.  BEST PHONE [phone]

## 2011-07-11 NOTE — Telephone Encounter (Signed)
Pt called back questioning whether Dr Darron Doom had meant to call in Rx drops for ears or something OTC bc pharmacy did not have it. Told pt per OV notes that was Rx and that I will call in Rx now since pharm did not receive. Pt verbalized understanding. Called in Rx drops.

## 2011-07-13 ENCOUNTER — Telehealth: Payer: Self-pay

## 2011-07-13 NOTE — Telephone Encounter (Signed)
WOULD LIKE CALL BACK FROM [facility] RE: LAB RESULTS

## 2011-07-13 NOTE — Telephone Encounter (Signed)
Spoke with patient about labs.  Patient states understanding and I will fax results to patient.

## 2011-08-20 ENCOUNTER — Ambulatory Visit: Payer: Medicare Other | Admitting: Family Medicine

## 2011-08-27 ENCOUNTER — Encounter: Payer: Self-pay | Admitting: Family Medicine

## 2011-08-27 ENCOUNTER — Ambulatory Visit (INDEPENDENT_AMBULATORY_CARE_PROVIDER_SITE_OTHER): Payer: Medicare Other | Admitting: Family Medicine

## 2011-08-27 VITALS — BP 178/81 | HR 66 | Temp 97.3°F | Resp 16 | Ht 67.5 in | Wt 186.2 lb

## 2011-08-27 DIAGNOSIS — I10 Essential (primary) hypertension: Secondary | ICD-10-CM

## 2011-08-27 DIAGNOSIS — K219 Gastro-esophageal reflux disease without esophagitis: Secondary | ICD-10-CM

## 2011-08-27 DIAGNOSIS — F419 Anxiety disorder, unspecified: Secondary | ICD-10-CM

## 2011-08-27 MED ORDER — HYDROCHLOROTHIAZIDE 25 MG PO TABS: 25.0000 mg | ORAL_TABLET | Freq: Every day | ORAL | Status: DC

## 2011-08-27 MED ORDER — OMEPRAZOLE 20 MG PO CPDR: 20.0000 mg | DELAYED_RELEASE_CAPSULE | Freq: Every day | ORAL | Status: DC | PRN

## 2011-08-27 NOTE — Progress Notes (Signed)
  Subjective:    Patient ID: [name], female    DOB: 09-24-1934, 77 y.o.   MRN: [id number]  HPI Patient presents in follow up of hypertension.  Has had extensive evaluation at [facility] with a negative work up for secondary causes. Compliant with medications accept for an occasional missed pill.  Denies side effects.  Significant anxiety- patient states if she's stays busy less of an issue. Currently caring for 64 yo granddaughter. Has not been open to taking medications in the past.  Review of Systems  Respiratory: Negative for chest tightness and shortness of breath.   Cardiovascular: Negative for chest pain and leg swelling.       Objective:   Physical Exam  Constitutional: She appears well-developed and well-nourished.  Neck: Neck supple. No JVD present. No thyromegaly present.  Cardiovascular: Normal rate, regular rhythm and normal heart sounds.   Pulmonary/Chest: Effort normal and breath sounds normal.  Abdominal: Soft. Bowel sounds are normal. There is no hepatomegaly.  Neurological: She is alert.  Skin: Skin is warm.  Psychiatric: She has a normal mood and affect.          Assessment & Plan:   1. HTN (hypertension), benign   2. GERD (gastroesophageal reflux disease)   3. Anxiety    Given degree of  patient's anxiety she is to uncomfortable to change antihypertensive regimen. Will track BP and call in 3 weeks Elevated Ca+ likely secondary to diuretic and will recheck on follow up.

## 2012-07-16 ENCOUNTER — Telehealth: Payer: Self-pay

## 2012-07-16 NOTE — Telephone Encounter (Signed)
PT NEEDS A REFILL OF HER BP BILLS. SHE HAS SCHEDULED AN APPT 4/3 FOR HER CPE. SHE USES THE [facility] [address] # [id number]

## 2012-07-16 NOTE — Telephone Encounter (Signed)
Pended 30 day with refill, please advise if okay.

## 2012-07-17 MED ORDER — HYDROCHLOROTHIAZIDE 25 MG PO TABS: 25.0000 mg | ORAL_TABLET | Freq: Every day | ORAL | Status: DC

## 2012-07-17 NOTE — Telephone Encounter (Signed)
Rx sent 

## 2012-07-18 ENCOUNTER — Ambulatory Visit: Payer: Medicare Other | Admitting: Family Medicine

## 2012-07-18 ENCOUNTER — Other Ambulatory Visit: Payer: Self-pay | Admitting: *Deleted

## 2012-07-18 MED ORDER — HYDROCHLOROTHIAZIDE 25 MG PO TABS: 25.0000 mg | ORAL_TABLET | Freq: Every day | ORAL | Status: DC

## 2012-09-04 ENCOUNTER — Ambulatory Visit (INDEPENDENT_AMBULATORY_CARE_PROVIDER_SITE_OTHER): Payer: Medicare Other | Admitting: Family Medicine

## 2012-09-04 ENCOUNTER — Encounter: Payer: Self-pay | Admitting: Family Medicine

## 2012-09-04 VITALS — BP 168/72 | HR 63 | Temp 96.6°F | Resp 16 | Ht 68.0 in | Wt 182.0 lb

## 2012-09-04 DIAGNOSIS — E7800 Pure hypercholesterolemia, unspecified: Secondary | ICD-10-CM

## 2012-09-04 DIAGNOSIS — Z1329 Encounter for screening for other suspected endocrine disorder: Secondary | ICD-10-CM

## 2012-09-04 DIAGNOSIS — H6120 Impacted cerumen, unspecified ear: Secondary | ICD-10-CM

## 2012-09-04 DIAGNOSIS — E785 Hyperlipidemia, unspecified: Secondary | ICD-10-CM

## 2012-09-04 DIAGNOSIS — I10 Essential (primary) hypertension: Secondary | ICD-10-CM

## 2012-09-04 DIAGNOSIS — Z130 Encounter for screening for diseases of the blood and blood-forming organs and certain disorders involving the immune mechanism: Secondary | ICD-10-CM

## 2012-09-04 DIAGNOSIS — Z0000 Encounter for general adult medical examination without abnormal findings: Secondary | ICD-10-CM

## 2012-09-04 DIAGNOSIS — L989 Disorder of the skin and subcutaneous tissue, unspecified: Secondary | ICD-10-CM

## 2012-09-04 DIAGNOSIS — H6121 Impacted cerumen, right ear: Secondary | ICD-10-CM

## 2012-09-04 LAB — COMPREHENSIVE METABOLIC PANEL
AST: 23 U/L (ref 0–37)
Alkaline Phosphatase: 56 U/L (ref 39–117)
BUN: 23 mg/dL (ref 6–23)
Creat: 0.94 mg/dL (ref 0.50–1.10)
Glucose, Bld: 91 mg/dL (ref 70–99)
Potassium: 3.5 mEq/L (ref 3.5–5.3)
Total Bilirubin: 0.6 mg/dL (ref 0.3–1.2)

## 2012-09-04 LAB — LIPID PANEL
HDL: 59 mg/dL (ref >39)
LDL Cholesterol: 140 mg/dL — ABNORMAL HIGH (ref 0–99)
Total CHOL/HDL Ratio: 3.7 Ratio
Triglycerides: 109 mg/dL (ref <150)
VLDL: 22 mg/dL (ref 0–40)

## 2012-09-04 LAB — CBC WITH DIFFERENTIAL/PLATELET
Basophils Absolute: 0 10*3/uL (ref 0.0–0.1)
Basophils Relative: 0 % (ref 0–1)
Lymphocytes Relative: 24 % (ref 12–46)
MCHC: 33.4 g/dL (ref 30.0–36.0)
Neutro Abs: 3.4 10*3/uL (ref 1.7–7.7)
Platelets: 269 10*3/uL (ref 150–400)
RDW: 13.1 % (ref 11.5–15.5)
WBC: 5.2 10*3/uL (ref 4.0–10.5)

## 2012-09-04 LAB — IFOBT (OCCULT BLOOD): IFOBT: NEGATIVE

## 2012-09-04 MED ORDER — OMEPRAZOLE 20 MG PO CPDR: 20.0000 mg | DELAYED_RELEASE_CAPSULE | Freq: Every day | ORAL | Status: DC | PRN

## 2012-09-04 MED ORDER — HYDROCHLOROTHIAZIDE 25 MG PO TABS: 25.0000 mg | ORAL_TABLET | Freq: Every day | ORAL | Status: DC

## 2012-09-04 NOTE — Patient Instructions (Addendum)
Keeping You Healthy  Get These Tests  Blood Pressure- Have your blood pressure checked by your healthcare provider at least once a year.  Normal blood pressure is 120/80.  Weight- Have your body mass index (BMI) calculated to screen for obesity.  BMI is a measure of body fat based on height and weight.  You can calculate your own BMI at [employer]  Cholesterol- Have your cholesterol checked every year.  Diabetes- Have your blood sugar checked every year if you have high blood pressure, high cholesterol, a family history of diabetes or if you are overweight.  Pap Smear- Have a pap smear every 1 to 3 years if you have been sexually active.  If you are older than 65 and recent pap smears have been normal you may not need additional pap smears.  In addition, if you have had a hysterectomy  For benign disease additional pap smears are not necessary.  Mammogram-Yearly mammograms are essential for early detection of breast cancer  Screening for Colon Cancer- Colonoscopy starting at age 52. Screening may begin sooner depending on your family history and other health conditions.  Follow up colonoscopy as directed by your Gastroenterologist.  Screening for Osteoporosis- Screening begins at age 46 with bone density scanning, sooner if you are at higher risk for developing Osteoporosis.  Get these medicines  Calcium with Vitamin D- Your body requires 1200-1500 mg of Calcium a day and [phone] IU of Vitamin D a day.  You can only absorb 500 mg of Calcium at a time therefore Calcium must be taken in 2 or 3 separate doses throughout the day.  Hormones- Hormone therapy has been associated with increased risk for certain cancers and heart disease.  Talk to your healthcare provider about if you need relief from menopausal symptoms.  Aspirin- Ask your healthcare provider about taking Aspirin to prevent Heart Disease and Stroke.  Get these Immuniztions- You declined these vaccines.  Flu  shot- Every fall  Pneumonia shot- Once after the age of 39; if you are younger ask your healthcare provider if you need a pneumonia shot.  Tetanus- Every ten years.  Zostavax- Once after the age of 25 to prevent shingles.  Take these steps  Don't smoke- Your healthcare provider can help you quit. For tips on how to quit, ask your healthcare provider or go to www.smokefree.gov or call 1-800 QUIT-NOW.  Be physically active- Exercise 5 days a week for a minimum of 30 minutes.  If you are not already physically active, start slow and gradually work up to 30 minutes of moderate physical activity.  Try walking, dancing, bike riding, swimming, etc.  Eat a healthy diet- Eat a variety of healthy foods such as fruits, vegetables, whole grains, low fat milk, low fat cheeses, yogurt, lean meats, chicken, fish, eggs, dried beans, tofu, etc.  For more information go to www.thenutritionsource.org  Dental visit- Brush and floss teeth twice daily; visit your dentist twice a year.  Eye exam- Visit your Optometrist or Ophthalmologist yearly.  Drink alcohol in moderation- Limit alcohol intake to one drink or less a day.  Never drink and drive.  Depression- Your emotional health is as important as your physical health.  If you're feeling down or losing interest in things you normally enjoy, please talk to your healthcare provider.  Seat Belts- can save your life; always wear one  Smoke/Carbon Monoxide detectors- These detectors need to be installed on the appropriate level of your home.  Replace batteries at least once a year.  Violence- If anyone is threatening or hurting you, please tell your healthcare provider.  Living Will/ Health care power of attorney- Discuss with your healthcare provider and family.  You have taken care of this issue.   You  Have ear wax blockage in right ear; get Debrox or urine ear wax removal kit at your pharmacy and use it for 4-5 days. Return to the office on Tuesday for  right ear irrigation.

## 2012-09-04 NOTE — Progress Notes (Signed)
Subjective:    Patient ID: [name], female    DOB: [birth year], 78 y.o.   MRN: [id number]  HPI  This 78 y.o. AA female is here for [facility] annual subsequent physical. She has HTN and   GERD, effectively treated with HCTZ and Omperazole respectively. Pt does not report adverse  medication reactions.   Review of Systems  HENT: Positive for tinnitus. Negative for hearing loss, congestion, postnasal drip and sinus pressure.   Eyes: Negative.   Respiratory: Negative for cough, chest tightness and shortness of breath.   Cardiovascular: Negative for chest pain, palpitations and leg swelling.  Gastrointestinal: Negative for nausea, vomiting, abdominal pain, constipation and blood in stool.  Skin:       Multiple skin lesions/ moles and skin tags.  Neurological: Negative for dizziness, syncope, weakness, light-headedness, numbness and headaches.  Psychiatric/Behavioral: Negative.   All other systems reviewed and are negative.       Objective:   Physical Exam  Vitals reviewed. Constitutional: She is oriented to person, place, and time. Vital signs are normal. She appears well-developed and well-nourished. No distress.  HENT:  Head: Normocephalic and atraumatic.  Right Ear: Hearing and external ear normal.  Left Ear: Hearing, tympanic membrane, external ear and ear canal normal.  Nose: Nose normal. No nasal deformity or septal deviation.  Mouth/Throat: Uvula is midline, oropharynx is clear and moist and mucous membranes are normal. No oral lesions.  R EAC occluded w/ cerumen.  Eyes: Conjunctivae, EOM and lids are normal. Pupils are equal, round, and reactive to light. No scleral icterus.  Pt has regular vision evaluation by  eyecare specialist.  Neck: Normal range of motion. Neck supple. No JVD present. No thyromegaly present.  Cardiovascular: Normal rate, regular rhythm, normal heart sounds and intact distal pulses.  Exam reveals no gallop and no friction rub.   No murmur  heard. Pulmonary/Chest: Effort normal and breath sounds normal. No respiratory distress. She has no rales. Right breast exhibits no inverted nipple, no mass, no nipple discharge, no skin change and no tenderness. Left breast exhibits no inverted nipple, no mass, no nipple discharge, no skin change and no tenderness.  Abdominal: Soft. Normal appearance and bowel sounds are normal. She exhibits no distension, no abdominal bruit, no pulsatile midline mass and no mass. There is no hepatosplenomegaly. There is no tenderness. There is no guarding and no CVA tenderness. No hernia.  Genitourinary: Rectum normal and vagina normal. Rectal exam shows no fissure, no mass, no tenderness and anal tone normal. Guaiac negative stool.  NEFG; no GYN exam performed.  Musculoskeletal: Normal range of motion. She exhibits no edema and no tenderness.  Lymphadenopathy:    She has no cervical adenopathy.  Neurological: She is alert and oriented to person, place, and time. She has normal reflexes. No cranial nerve deficit. She exhibits normal muscle tone. Coordination normal.  Skin: Skin is warm and dry.  Face- L cheek lesion is < 1 cm, flat and has mildly homogeneous pigmentation. Multiple hyperpigmented skin tags and nevi on trunk and neck- appearance is benign and c/w Seb Ks.  Psychiatric: She has a normal mood and affect. Her behavior is normal. Judgment and thought content normal.        Assessment & Plan:  Routine general medical examination at a health care facility - Plan: IFOBT POC (occult bld, rslt in office)  HTN, goal below 140/90 - stable; continue current medication.  Plan: Comprehensive metabolic panel  Cerumen impaction, right- use  OTC wax  removal drops; RTC on Tuesday for irrigation.   Elevated LDL cholesterol level - Plan: Lipid panel  Screening for other and unspecified endocrine, nutritional, metabolic, and immunity disorders - Plan: Vitamin D, 25-hydroxy, CBC with Differential  Skin lesion of  back - Plan: Ambulatory referral to Dermatology  Skin lesion of face - Plan: Ambulatory referral to Dermatology

## 2012-09-07 NOTE — Progress Notes (Signed)
Quick Note:  Please contact pt and advise that the following labs are abnormal... Vitamin D is in the low normal range; get an OTC supplement 1000 IU and take 1 capsule daily. Blood counts are normal. Chemistries/ kidney and liver tests are normal. Lipid panel shows Total cholesterol and LDL ("bad") cholesterol are the same as 1 year ago. HDL ("good") cholesterol is high which is good. Try to eat as healthy as possible and maintain an active lifestyle. Continue Fish Oil supplement. Heart disease risk is about average based on these numbers.  Copy to pt. ______

## 2012-09-08 ENCOUNTER — Telehealth: Payer: Self-pay

## 2012-09-08 NOTE — Telephone Encounter (Signed)
PATIENT OF [facility]. CALLING TO GET THE NAME AND ADDRESS OF THE DERMATOLOGIST THAT SHE WAS REFERRED TO

## 2012-09-09 ENCOUNTER — Ambulatory Visit (INDEPENDENT_AMBULATORY_CARE_PROVIDER_SITE_OTHER): Payer: Medicare Other | Admitting: Family Medicine

## 2012-09-09 VITALS — BP 190/90 | HR 58 | Temp 98.0°F | Resp 16 | Ht 68.0 in | Wt 186.0 lb

## 2012-09-09 DIAGNOSIS — H6121 Impacted cerumen, right ear: Secondary | ICD-10-CM

## 2012-09-09 DIAGNOSIS — H6120 Impacted cerumen, unspecified ear: Secondary | ICD-10-CM

## 2012-09-09 NOTE — Progress Notes (Signed)
S:  This 79 y.o. AA female returns for R ear irrigation after using cerumen removal drops; pt states R ear feels better and hearing is a little better.  O:  Filed Vitals:   09/09/12 1141  BP: 190/90  Pulse: 58  Temp: 98 F (36.7 C)  Resp: 16   GEN: In NAD; WN,WD. HENT: [place]/AT; EOMI w/ clear conj/ sclerae. Oroph clear and moist.  R EAC is occluded- procedure note: irrigation partially effective. Remainder of cerumen removed w/ curette.  After removal: EAC is mildly erythematous.  A/P: Cerumen impaction, right- impaction removed.

## 2013-01-07 ENCOUNTER — Other Ambulatory Visit: Payer: Self-pay

## 2013-04-09 ENCOUNTER — Other Ambulatory Visit: Payer: Self-pay

## 2013-09-04 ENCOUNTER — Ambulatory Visit (INDEPENDENT_AMBULATORY_CARE_PROVIDER_SITE_OTHER): Payer: Medicare Other | Admitting: Family Medicine

## 2013-09-04 ENCOUNTER — Encounter: Payer: Self-pay | Admitting: Family Medicine

## 2013-09-04 VITALS — BP 174/94 | HR 73 | Temp 97.8°F | Resp 16 | Ht 67.5 in | Wt 183.0 lb

## 2013-09-04 DIAGNOSIS — N6489 Other specified disorders of breast: Secondary | ICD-10-CM

## 2013-09-04 DIAGNOSIS — N63 Unspecified lump in unspecified breast: Secondary | ICD-10-CM

## 2013-09-04 DIAGNOSIS — N6310 Unspecified lump in the right breast, unspecified quadrant: Secondary | ICD-10-CM

## 2013-09-04 DIAGNOSIS — E785 Hyperlipidemia, unspecified: Secondary | ICD-10-CM

## 2013-09-04 DIAGNOSIS — Z139 Encounter for screening, unspecified: Secondary | ICD-10-CM

## 2013-09-04 DIAGNOSIS — Z0000 Encounter for general adult medical examination without abnormal findings: Secondary | ICD-10-CM

## 2013-09-04 DIAGNOSIS — I10 Essential (primary) hypertension: Secondary | ICD-10-CM

## 2013-09-04 LAB — COMPLETE METABOLIC PANEL WITH GFR
ALT: 17 U/L (ref 0–35)
AST: 21 U/L (ref 0–37)
Albumin: 4.1 g/dL (ref 3.5–5.2)
Alkaline Phosphatase: 49 U/L (ref 39–117)
BILIRUBIN TOTAL: 0.8 mg/dL (ref 0.2–1.2)
BUN: 16 mg/dL (ref 6–23)
CO2: 25 mEq/L (ref 19–32)
Calcium: 10.4 mg/dL (ref 8.4–10.5)
Chloride: 106 mEq/L (ref 96–112)
Creat: 0.9 mg/dL (ref 0.50–1.10)
GFR, EST AFRICAN AMERICAN: 72 mL/min
GFR, Est Non African American: 62 mL/min
Glucose, Bld: 95 mg/dL (ref 70–99)
Potassium: 3.8 mEq/L (ref 3.5–5.3)
Sodium: 141 mEq/L (ref 135–145)
Total Protein: 6.8 g/dL (ref 6.0–8.3)

## 2013-09-04 LAB — POCT GLYCOSYLATED HEMOGLOBIN (HGB A1C): Hemoglobin A1C: 5.7

## 2013-09-04 LAB — THYROID PANEL WITH TSH
Free Thyroxine Index: 2.3 (ref 1.0–3.9)
T3 Uptake: 33.1 % (ref 22.5–37.0)
T4, Total: 7 ug/dL (ref 5.0–12.5)
TSH: 1.783 u[IU]/mL (ref 0.350–4.500)

## 2013-09-04 LAB — LIPID PANEL
CHOLESTEROL: 249 mg/dL — AB (ref 0–200)
HDL: 54 mg/dL (ref >39)
LDL CALC: 172 mg/dL — AB (ref 0–99)
TRIGLYCERIDES: 116 mg/dL (ref <150)
Total CHOL/HDL Ratio: 4.6 Ratio
VLDL: 23 mg/dL (ref 0–40)

## 2013-09-04 LAB — IFOBT (OCCULT BLOOD): IMMUNOLOGICAL FECAL OCCULT BLOOD TEST: NEGATIVE

## 2013-09-04 MED ORDER — HYDROCHLOROTHIAZIDE 25 MG PO TABS: 25.0000 mg | ORAL_TABLET | Freq: Every day | ORAL | Status: DC

## 2013-09-04 MED ORDER — OMEPRAZOLE 20 MG PO CPDR: 20.0000 mg | DELAYED_RELEASE_CAPSULE | Freq: Every day | ORAL | Status: DC | PRN

## 2013-09-04 NOTE — Patient Instructions (Signed)

## 2013-09-04 NOTE — Progress Notes (Signed)
Subjective:    Patient ID: [name], female    DOB: 1934-11-03, 79 y.o.   MRN: [id number]  HPI  This 79 y.o. AA female is here for [facility] Subsequent Annual CPE. She has HTN and has high readings at times of stress. She is asymptomatic w/ these elevations; current medication is well tolerated. Her son is taking a BP medication that the pt is interested in trying.  Pt is married and retired. She stays active w/ church and community activities.   HCM: MMG- 2008 (negative).           DEXA- ?           CRS- Current (negative).           IMM- Pt declines all recommended vaccines.           Vision- Every 1-2 years; wears corrective lenses.  Patient Active Problem List   Diagnosis Date Noted  . Anxiety   . PUD (peptic ulcer disease)   . HTN (hypertension), benign 06/29/2011  . GERD (gastroesophageal reflux disease) 06/29/2011   Prior to Admission medications   Medication Sig Start Date End Date Taking? Authorizing Provider  aspirin 81 MG tablet Take 81 mg by mouth daily.   Yes Historical Provider, MD  fish oil-omega-3 fatty acids 1000 MG capsule Take 1 g by mouth daily.   Yes Historical Provider, MD  hydrochlorothiazide (HYDRODIURIL) 25 MG tablet Take 1 tablet (25 mg total) by mouth daily.   Yes Barton Fanny, MD  Multiple Vitamin (MULTIVITAMIN) tablet Take 1 tablet by mouth daily.   Yes Historical Provider, MD  nitroGLYCERIN (NITROSTAT) 0.4 MG SL tablet Place 0.4 mg under the tongue as needed.   Yes Historical Provider, MD  omeprazole (PRILOSEC) 20 MG capsule Take 1 capsule (20 mg total) by mouth daily as needed.    Barton Fanny, MD   PMHx, Surg Hx, Soc and Fam Hx reviewed.   Review of Systems  Constitutional: Negative.   HENT: Positive for tinnitus.   Eyes: Negative.   Respiratory: Negative.   Cardiovascular: Negative.   Gastrointestinal: Negative.   Endocrine: Negative.   Genitourinary: Negative.   Musculoskeletal: Negative.   Skin: Negative.     Allergic/Immunologic: Negative.   Neurological: Negative.   Hematological: Negative.   Psychiatric/Behavioral: Negative.       Objective:   Physical Exam  Nursing note and vitals reviewed. Constitutional: She is oriented to person, place, and time. She appears well-developed and well-nourished. No distress.  BP elevated.  HENT:  Head: Normocephalic and atraumatic.  Right Ear: Hearing, tympanic membrane, external ear and ear canal normal.  Left Ear: Hearing, tympanic membrane, external ear and ear canal normal.  Nose: Nose normal. No nasal deformity or septal deviation.  Mouth/Throat: Uvula is midline, oropharynx is clear and moist and mucous membranes are normal. No oral lesions. Normal dentition.  Eyes: Conjunctivae, EOM and lids are normal. Pupils are equal, round, and reactive to light. No scleral icterus.  Wearing corrective lenses.  Neck: Trachea normal, normal range of motion and full passive range of motion without pain. Neck supple. No JVD present. No spinous process tenderness and no muscular tenderness present. Carotid bruit is not present. No mass and no thyromegaly present.  Cardiovascular: Normal rate, regular rhythm, S1 normal, S2 normal, normal heart sounds and normal pulses.   No extrasystoles are present. PMI is not displaced.  Exam reveals no gallop and no friction rub.   No murmur heard. Pulmonary/Chest: Effort  normal and breath sounds normal. No respiratory distress. Right breast exhibits mass. Right breast exhibits no inverted nipple, no nipple discharge, no skin change and no tenderness. Left breast exhibits mass. Left breast exhibits no inverted nipple, no nipple discharge, no skin change and no tenderness. Breasts are asymmetrical.    Abdominal: Soft. Normal appearance and bowel sounds are normal. She exhibits no distension, no pulsatile midline mass and no mass. There is no hepatosplenomegaly. There is no tenderness. There is no guarding and no CVA tenderness.   Genitourinary: Rectal exam shows anal tone abnormal. Rectal exam shows no external hemorrhoid, no fissure, no mass and no tenderness.  NEFG; pelvic deferred (pt asymptomatic).  Musculoskeletal:       Right shoulder: Normal.       Left shoulder: Normal.       Right hip: Normal.       Left hip: Normal.       Right knee: Normal.       Left knee: Normal.       Cervical back: Normal.       Thoracic back: Normal.       Lumbar back: Normal.  Remainder of exam unremarkable.  Lymphadenopathy:       Head (right side): No submental, no submandibular, no tonsillar, no preauricular, no posterior auricular and no occipital adenopathy present.       Head (left side): No submental, no submandibular, no tonsillar, no preauricular, no posterior auricular and no occipital adenopathy present.    She has no cervical adenopathy.    She has no axillary adenopathy.       Right: No inguinal and no supraclavicular adenopathy present.       Left: No inguinal and no supraclavicular adenopathy present.  Neurological: She is alert and oriented to person, place, and time. She has normal strength and normal reflexes. She displays no atrophy and no tremor. No cranial nerve deficit or sensory deficit. She exhibits normal muscle tone. She displays a negative Romberg sign. Coordination and gait normal.  Skin: Skin is warm, dry and intact. No bruising, no lesion and no rash noted. She is not diaphoretic. No cyanosis or erythema. Nails show no clubbing.  Psychiatric: She has a normal mood and affect. Her speech is normal and behavior is normal. Judgment and thought content normal. Cognition and memory are normal.    Results for orders placed in visit on 09/04/13  IFOBT (OCCULT BLOOD)      Result Value Ref Range   IFOBT Negative    POCT GLYCOSYLATED HEMOGLOBIN (HGB A1C)      Result Value Ref Range   Hemoglobin A1C 5.7        Assessment & Plan:  Routine general medical examination at a health care facility - Plan:  IFOBT POC (occult bld, rslt in office), POCT glycosylated hemoglobin (Hb A1C), Thyroid Panel With TSH, Lipid panel, COMPLETE METABOLIC PANEL WITH GFR, MM Digital Diagnostic Bilat  HTN (hypertension), benign -  Continue HCTZ; pt interested on taking medication recommended by her son. She will call back to clinic with name of medication. Plan: Thyroid Panel With TSH, COMPLETE METABOLIC PANEL WITH GFR  Other and unspecified hyperlipidemia - Recommend TLCs pending results. Plan: Thyroid Panel With TSH, Lipid panel  Mass of breast, right - Plan: MM Digital Diagnostic Bilat  Meds ordered this encounter  Medications  . DISCONTD: hydrochlorothiazide (HYDRODIURIL) 25 MG tablet    Sig: Take 1 tablet (25 mg total) by mouth daily.    Dispense:  90 tablet    Refill:  3  . omeprazole (PRILOSEC) 20 MG capsule    Sig: Take 1 capsule (20 mg total) by mouth daily as needed.    Dispense:  90 capsule    Refill:  1

## 2013-09-05 ENCOUNTER — Telehealth: Payer: Self-pay

## 2013-09-05 MED ORDER — LISINOPRIL-HYDROCHLOROTHIAZIDE 10-12.5 MG PO TABS: 1.0000 | ORAL_TABLET | Freq: Every day | ORAL | Status: DC

## 2013-09-05 NOTE — Telephone Encounter (Signed)
I spoke w/ pt about BP medication. She would prefer to try Lisinopril- HCTZ combination pill. I have e-prescribed 10-12.5 mg tablet  1 tab daily. Pt aware and will pick it up tomorrow from [facility].

## 2013-09-05 NOTE — Telephone Encounter (Signed)
Pt was told to call back to let dr Leward Quan know what bp med she was taking   Lisinopril hctz   Best number [phone]

## 2013-09-06 DIAGNOSIS — N6489 Other specified disorders of breast: Secondary | ICD-10-CM | POA: Insufficient documentation

## 2013-09-08 ENCOUNTER — Encounter: Payer: Self-pay | Admitting: Family Medicine

## 2013-09-09 ENCOUNTER — Telehealth: Payer: Self-pay

## 2013-09-09 NOTE — Telephone Encounter (Signed)
Patient called, wanted copy of labs mailed.

## 2013-11-10 ENCOUNTER — Telehealth: Payer: Self-pay

## 2013-11-10 MED ORDER — LOSARTAN POTASSIUM-HCTZ 50-12.5 MG PO TABS: 1.0000 | ORAL_TABLET | Freq: Every day | ORAL | Status: DC

## 2013-11-10 NOTE — Telephone Encounter (Signed)
Butch Penny ,will you check on the mammogram, order in , what is the hold up?  Dr Leward Quan, please advise on the BP meds.

## 2013-11-10 NOTE — Telephone Encounter (Signed)
PT STATES THE LISINOPRIL DR [place]HERSON PUT HER ON IS CAUSING HER TO COUGH ALL THE TIME. WOULD LIKE TO GO BACK TO THE OLD BP MEDS SHE USE TO TAKE PLEASE CALL PT AT [phone] ALSO SHE WAS TO BE REFERRED TO HAVE A MAMMOGRAM AND STILL HASN'T HEARD FROM ANYONE AND IT'S BEEN OVER 2 MONTHS NOW       [place] ON [address]

## 2013-11-10 NOTE — Telephone Encounter (Signed)
Pt previously on HCTZ. I can switch her to an ARB (Hyzaar- generic) and see if cough subsides.

## 2013-11-11 NOTE — Telephone Encounter (Signed)
Thanks,

## 2013-11-11 NOTE — Telephone Encounter (Signed)
I have called patient

## 2014-01-06 ENCOUNTER — Encounter: Payer: Self-pay | Admitting: Family Medicine

## 2014-01-06 ENCOUNTER — Ambulatory Visit (INDEPENDENT_AMBULATORY_CARE_PROVIDER_SITE_OTHER): Payer: Medicare Other | Admitting: Family Medicine

## 2014-01-06 VITALS — BP 210/88 | HR 80 | Temp 98.5°F | Resp 16 | Ht 67.75 in | Wt 178.4 lb

## 2014-01-06 DIAGNOSIS — I10 Essential (primary) hypertension: Secondary | ICD-10-CM

## 2014-01-06 MED ORDER — LISINOPRIL-HYDROCHLOROTHIAZIDE 10-12.5 MG PO TABS: 1.0000 | ORAL_TABLET | Freq: Every day | ORAL | Status: DC

## 2014-01-06 NOTE — Patient Instructions (Signed)
Mediterranean Diet  Why follow it? Research shows.   Those who follow the Mediterranean diet have a reduced risk of heart disease    The diet is associated with a reduced incidence of Parkinson's and Alzheimer's diseases   People following the diet may have longer life expectancies and lower rates of chronic diseases    The Dietary Guidelines for Americans recommends the Mediterranean diet as an eating plan to promote health and prevent disease  What Is the Mediterranean Diet?    Healthy eating plan based on typical foods and recipes of Mediterranean-style cooking   The diet is primarily a plant based diet; these foods should make up a majority of meals   Starches - Plant based foods should make up a majority of meals - They are an important sources of vitamins, minerals, energy, antioxidants, and fiber - Choose whole grains, foods high in fiber and minimally processed items  - Typical grain sources include wheat, oats, barley, corn, brown rice, bulgar, farro, millet, polenta, couscous  - Various types of beans include chickpeas, lentils, fava beans, black beans, white beans   Fruits  Veggies - Large quantities of antioxidant rich fruits & veggies; 6 or more servings  - Vegetables can be eaten raw or lightly drizzled with oil and cooked  - Vegetables common to the traditional Mediterranean Diet include: artichokes, arugula, beets, broccoli, brussel sprouts, cabbage, carrots, celery, collard greens, cucumbers, eggplant, kale, leeks, lemons, lettuce, mushrooms, okra, onions, peas, peppers, potatoes, pumpkin, radishes, rutabaga, shallots, spinach, sweet potatoes, turnips, zucchini - Fruits common to the Mediterranean Diet include: apples, apricots, avocados, cherries, clementines, dates, figs, grapefruits, grapes, melons, nectarines, oranges, peaches, pears, pomegranates, strawberries, tangerines  Fats - Replace butter and margarine with healthy oils, such as olive oil, canola oil, and tahini   - Limit nuts to no more than a handful a day  - Nuts include walnuts, almonds, pecans, pistachios, pine nuts  - Limit or avoid candied, honey roasted or heavily salted nuts - Olives are central to the Mediterranean diet - can be eaten whole or used in a variety of dishes   Meats Protein - Limiting red meat: no more than a few times a month - When eating red meat: choose lean cuts and keep the portion to the size of deck of cards - Eggs: approx. 0 to 4 times a week  - Fish and lean poultry: at least 2 a week  - Healthy protein sources include, chicken, turkey, lean beef, lamb - Increase intake of seafood such as tuna, salmon, trout, mackerel, shrimp, scallops - Avoid or limit high fat processed meats such as sausage and bacon  Dairy - Include moderate amounts of low fat dairy products  - Focus on healthy dairy such as fat free yogurt, skim milk, low or reduced fat cheese - Limit dairy products higher in fat such as whole or 2% milk, cheese, ice cream  Alcohol - Moderate amounts of red wine is ok  - No more than 5 oz daily for women (all ages) and men older than age 65  - No more than 10 oz of wine daily for men younger than 65  Other - Limit sweets and other desserts  - Use herbs and spices instead of salt to flavor foods  - Herbs and spices common to the traditional Mediterranean Diet include: basil, bay leaves, chives, cloves, cumin, fennel, garlic, lavender, marjoram, mint, oregano, parsley, pepper, rosemary, sage, savory, sumac, tarragon, thyme   It's not just a diet,   it's a lifestyle:    The Mediterranean diet includes lifestyle factors typical of those in the region    Foods, drinks and meals are best eaten with others and savored   Daily physical activity is important for overall good health   This could be strenuous exercise like running and aerobics   This could also be more leisurely activities such as walking, housework, yard-work, or taking the stairs   Moderation is the key;  a balanced and healthy diet accommodates most foods and drinks   Consider portion sizes and frequency of consumption of certain foods   Meal Ideas & Options:    Breakfast:  o Whole wheat toast or whole wheat English muffins with peanut butter & hard boiled egg o Steel cut oats topped with apples & cinnamon and skim milk  o Fresh fruit: banana, strawberries, melon, berries, peaches  o Smoothies: strawberries, bananas, greek yogurt, peanut butter o Low fat greek yogurt with blueberries and granola  o Egg white omelet with spinach and mushrooms o Breakfast couscous: whole wheat couscous, apricots, skim milk, cranberries    Sandwiches:  o Hummus and grilled vegetables (peppers, zucchini, squash) on whole wheat bread   o Grilled chicken on whole wheat pita with lettuce, tomatoes, cucumbers or tzatziki  o Tuna salad on whole wheat bread: tuna salad made with greek yogurt, olives, red peppers, capers, green onions o Garlic rosemary lamb pita: lamb sauted with garlic, rosemary, salt & pepper; add lettuce, cucumber, greek yogurt to pita - flavor with lemon juice and black pepper    Seafood:  o Mediterranean grilled salmon, seasoned with garlic, basil, parsley, lemon juice and black pepper o Shrimp, lemon, and spinach whole-grain pasta salad made with low fat greek yogurt  o Seared scallops with lemon orzo  o Seared tuna steaks seasoned salt, pepper, coriander topped with tomato mixture of olives, tomatoes, olive oil, minced garlic, parsley, green onions and cappers    Meats:  o Herbed greek chicken salad with kalamata olives, cucumber, feta  o Red bell peppers stuffed with spinach, bulgur, lean ground beef (or lentils) & topped with feta   o Kebabs: skewers of chicken, tomatoes, onions, zucchini, squash  o Turkey burgers: made with red onions, mint, dill, lemon juice, feta cheese topped with roasted red peppers   Vegetarian o Cucumber salad: cucumbers, artichoke hearts, celery, red onion, feta  cheese, tossed in olive oil & lemon juice  o Hummus and whole grain pita points with a greek salad (lettuce, tomato, feta, olives, cucumbers, red onion) o Lentil soup with celery, carrots made with vegetable broth, garlic, salt and pepper  o Tabouli salad: parsley, bulgur, mint, scallions, cucumbers, tomato, radishes, lemon juice, olive oil, salt and pepper. o  

## 2014-01-06 NOTE — Progress Notes (Signed)
S:  This 79 y.o. AA female has HTN; she checks BP at home w/ readings 140-150/70-85. She denies vision disturbances, CP or tightness, palpitations, dizziness, HA, numbness or weakness. Nocturnal cough was attributed to Lisinopril; pt takes medication in the mornings and cough has disappeared. She would prefer to continue this medication.   Patient Active Problem List   Diagnosis Date Noted  . Breast asymmetry 09/06/2013  . Anxiety   . PUD (peptic ulcer disease)   . HTN (hypertension), benign 06/29/2011  . GERD (gastroesophageal reflux disease) 06/29/2011    Outpatient Encounter Prescriptions as of 01/06/2014  Medication Sig  . aspirin 81 MG tablet Take 81 mg by mouth daily.  . fish oil-omega-3 fatty acids 1000 MG capsule Take 1 g by mouth daily.  Marland Kitchen lisinopril-hydrochlorothiazide (PRINZIDE,ZESTORETIC) 10-12.5 MG per tablet Take 1 tablet by mouth daily.  . Multiple Vitamin (MULTIVITAMIN) tablet Take 1 tablet by mouth daily.  . nitroGLYCERIN (NITROSTAT) 0.4 MG SL tablet Place 0.4 mg under the tongue as needed.  Marland Kitchen omeprazole (PRILOSEC) 20 MG capsule Take 1 capsule (20 mg total) by mouth daily as needed.        No Active Allergies   History   Social History  . Marital Status: Married    Spouse Name: N/A    Number of Children: N/A  . Years of Education: N/A   Occupational History  . retired    Social History Main Topics  . Smoking status: Never Smoker   . Smokeless tobacco: Not on file  . Alcohol Use: No  . Drug Use: No  . Sexual Activity: Not on file   Other Topics Concern  . Not on file   ROS: As per HPI.   O: Filed Vitals:   01/06/14 0850  BP: 210/88             BP recheck by MD: 160/90  Pulse: 80  Temp: 98.5 F (36.9 C)  Resp: 16   GEN: In NAD: WN,WD. HENT: [place]/AT. EOMI w/ clear conj/sclerae. Otherwise unremarkable. COR: RRR w/ occasional extra-systole. No edema. LUNGS: CTA. SKIN: W&D; intact. NEURO: A&O x 3; CNs intact. Nonfocal.  A/P: HTN (hypertension),  benign- Pt may have [place] HTN; continue current medication. RTC in 3 months.  Meds ordered this encounter  Medications  . lisinopril-hydrochlorothiazide (PRINZIDE,ZESTORETIC) 10-12.5 MG per tablet    Sig: Take 1 tablet by mouth daily.    Dispense:  90 tablet    Refill:  1

## 2014-04-08 ENCOUNTER — Encounter: Payer: Self-pay | Admitting: Family Medicine

## 2014-04-08 ENCOUNTER — Ambulatory Visit (INDEPENDENT_AMBULATORY_CARE_PROVIDER_SITE_OTHER): Payer: Medicare Other | Admitting: Family Medicine

## 2014-04-08 VITALS — BP 184/80 | HR 65 | Temp 98.0°F | Resp 16 | Ht 68.0 in | Wt 180.0 lb

## 2014-04-08 DIAGNOSIS — E7800 Pure hypercholesterolemia, unspecified: Secondary | ICD-10-CM

## 2014-04-08 DIAGNOSIS — I10 Essential (primary) hypertension: Secondary | ICD-10-CM

## 2014-04-08 HISTORY — DX: Pure hypercholesterolemia, unspecified: E78.00

## 2014-04-08 MED ORDER — LISINOPRIL-HYDROCHLOROTHIAZIDE 10-12.5 MG PO TABS: 1.0000 | ORAL_TABLET | Freq: Every day | ORAL | Status: DC

## 2014-04-08 NOTE — Patient Instructions (Signed)
Mediterranean Diet  Why follow it? Research shows.   Those who follow the Mediterranean diet have a reduced risk of heart disease    The diet is associated with a reduced incidence of Parkinson's and Alzheimer's diseases   People following the diet may have longer life expectancies and lower rates of chronic diseases    The Dietary Guidelines for Americans recommends the Mediterranean diet as an eating plan to promote health and prevent disease  What Is the Mediterranean Diet?    Healthy eating plan based on typical foods and recipes of Mediterranean-style cooking   The diet is primarily a plant based diet; these foods should make up a majority of meals   Starches - Plant based foods should make up a majority of meals - They are an important sources of vitamins, minerals, energy, antioxidants, and fiber - Choose whole grains, foods high in fiber and minimally processed items  - Typical grain sources include wheat, oats, barley, corn, brown rice, bulgar, farro, millet, polenta, couscous  - Various types of beans include chickpeas, lentils, fava beans, black beans, white beans   Fruits  Veggies - Large quantities of antioxidant rich fruits & veggies; 6 or more servings  - Vegetables can be eaten raw or lightly drizzled with oil and cooked  - Vegetables common to the traditional Mediterranean Diet include: artichokes, arugula, beets, broccoli, brussel sprouts, cabbage, carrots, celery, collard greens, cucumbers, eggplant, kale, leeks, lemons, lettuce, mushrooms, okra, onions, peas, peppers, potatoes, pumpkin, radishes, rutabaga, shallots, spinach, sweet potatoes, turnips, zucchini - Fruits common to the Mediterranean Diet include: apples, apricots, avocados, cherries, clementines, dates, figs, grapefruits, grapes, melons, nectarines, oranges, peaches, pears, pomegranates, strawberries, tangerines  Fats - Replace butter and margarine with healthy oils, such as olive oil, canola oil, and tahini   - Limit nuts to no more than a handful a day  - Nuts include walnuts, almonds, pecans, pistachios, pine nuts  - Limit or avoid candied, honey roasted or heavily salted nuts - Olives are central to the Mediterranean diet - can be eaten whole or used in a variety of dishes   Meats Protein - Limiting red meat: no more than a few times a month - When eating red meat: choose lean cuts and keep the portion to the size of deck of cards - Eggs: approx. 0 to 4 times a week  - Fish and lean poultry: at least 2 a week  - Healthy protein sources include, chicken, turkey, lean beef, lamb - Increase intake of seafood such as tuna, salmon, trout, mackerel, shrimp, scallops - Avoid or limit high fat processed meats such as sausage and bacon  Dairy - Include moderate amounts of low fat dairy products  - Focus on healthy dairy such as fat free yogurt, skim milk, low or reduced fat cheese - Limit dairy products higher in fat such as whole or 2% milk, cheese, ice cream  Alcohol - Moderate amounts of red wine is ok  - No more than 5 oz daily for women (all ages) and men older than age 65  - No more than 10 oz of wine daily for men younger than 65  Other - Limit sweets and other desserts  - Use herbs and spices instead of salt to flavor foods  - Herbs and spices common to the traditional Mediterranean Diet include: basil, bay leaves, chives, cloves, cumin, fennel, garlic, lavender, marjoram, mint, oregano, parsley, pepper, rosemary, sage, savory, sumac, tarragon, thyme   It's not just a diet,   it's a lifestyle:    The Mediterranean diet includes lifestyle factors typical of those in the region    Foods, drinks and meals are best eaten with others and savored   Daily physical activity is important for overall good health   This could be strenuous exercise like running and aerobics   This could also be more leisurely activities such as walking, housework, yard-work, or taking the stairs   Moderation is the key;  a balanced and healthy diet accommodates most foods and drinks   Consider portion sizes and frequency of consumption of certain foods   Meal Ideas & Options:    Breakfast:  o Whole wheat toast or whole wheat English muffins with peanut butter & hard boiled egg o Steel cut oats topped with apples & cinnamon and skim milk  o Fresh fruit: banana, strawberries, melon, berries, peaches  o Smoothies: strawberries, bananas, greek yogurt, peanut butter o Low fat greek yogurt with blueberries and granola  o Egg white omelet with spinach and mushrooms o Breakfast couscous: whole wheat couscous, apricots, skim milk, cranberries    Sandwiches:  o Hummus and grilled vegetables (peppers, zucchini, squash) on whole wheat bread   o Grilled chicken on whole wheat pita with lettuce, tomatoes, cucumbers or tzatziki  o Tuna salad on whole wheat bread: tuna salad made with greek yogurt, olives, red peppers, capers, green onions o Garlic rosemary lamb pita: lamb sauted with garlic, rosemary, salt & pepper; add lettuce, cucumber, greek yogurt to pita - flavor with lemon juice and black pepper    Seafood:  o Mediterranean grilled salmon, seasoned with garlic, basil, parsley, lemon juice and black pepper o Shrimp, lemon, and spinach whole-grain pasta salad made with low fat greek yogurt  o Seared scallops with lemon orzo  o Seared tuna steaks seasoned salt, pepper, coriander topped with tomato mixture of olives, tomatoes, olive oil, minced garlic, parsley, green onions and cappers    Meats:  o Herbed greek chicken salad with kalamata olives, cucumber, feta  o Red bell peppers stuffed with spinach, bulgur, lean ground beef (or lentils) & topped with feta   o Kebabs: skewers of chicken, tomatoes, onions, zucchini, squash  o Turkey burgers: made with red onions, mint, dill, lemon juice, feta cheese topped with roasted red peppers   Vegetarian o Cucumber salad: cucumbers, artichoke hearts, celery, red onion, feta  cheese, tossed in olive oil & lemon juice  o Hummus and whole grain pita points with a greek salad (lettuce, tomato, feta, olives, cucumbers, red onion) o Lentil soup with celery, carrots made with vegetable broth, garlic, salt and pepper  o Tabouli salad: parsley, bulgur, mint, scallions, cucumbers, tomato, radishes, lemon juice, olive oil, salt and pepper. o  

## 2014-04-08 NOTE — Progress Notes (Addendum)
S:  This 79 y.o. AA female has HTN and is compliant w/ medications and practices a healthy lifestyle. BP readings at home= 150-160/ 70-90. She also has elevated total and LDL cholesterol and declines use of statins. She is taking fish oil 1 capsule daily; she is considering resuming Red Yeast Rice Extract. She denies fatigue, vision changes, CP or tightness, palpitations, SOB or DOE, cough, edema, HA, dizziness, lightheadedness, numbness, weakness or syncope.  She declines pneumonia, influenza and shingles vaccines.  Patient Active Problem List   Diagnosis Date Noted  . Pure hypercholesterolemia 04/08/2014  . Breast asymmetry 09/06/2013  . Anxiety   . PUD (peptic ulcer disease)   . HTN (hypertension), benign 06/29/2011  . GERD (gastroesophageal reflux disease) 06/29/2011    Prior to Admission medications   Medication Sig Start Date End Date Taking? Authorizing Provider  aspirin 81 MG tablet Take 81 mg by mouth daily.   Yes Historical Provider, MD  fish oil-omega-3 fatty acids 1000 MG capsule Take 1 g by mouth daily.   Yes Historical Provider, MD  lisinopril-hydrochlorothiazide (PRINZIDE,ZESTORETIC) 10-12.5 MG per tablet Take 1 tablet by mouth daily. 04/08/14  Yes Barton Fanny, MD  Multiple Vitamin (MULTIVITAMIN) tablet Take 1 tablet by mouth daily.   Yes Historical Provider, MD  nitroGLYCERIN (NITROSTAT) 0.4 MG SL tablet Place 0.4 mg under the tongue as needed.    Historical Provider, MD  omeprazole (PRILOSEC) 20 MG capsule Take 1 capsule (20 mg total) by mouth daily as needed. 09/04/13   Barton Fanny, MD    ROS: As per HPI.  Jenetta DownerDanley Danker Vitals:   04/08/14 0837  BP: 184/80                BP recheck: 156/80  Pulse: 65  Temp: 98 F (36.7 C)  Resp: 16    GEN: In NAD; WN,WD. Appears much younger than stated age. HENT: [place]/AT; EOMI w/ clear conj/sclerae. Wears corrective lenses. Otherwise unremarkable. COR: RRR. No edema. LUNGS: Unlabored resp. SKIN: W&D; intact w/o  diaphoresis or erythema. NEURO: A&O x 3; CNs intact. Nonfocal.  A/P: HTN (hypertension), benign- Stable and controlled. Pt asymptomatic. No medication change at this time.  Pure hypercholesterolemia- Advised increase Fsih Oil to 2 caps daily. Mediterranean Diet suggested.  Meds ordered this encounter  Medications  . lisinopril-hydrochlorothiazide (PRINZIDE,ZESTORETIC) 10-12.5 MG per tablet    Sig: Take 1 tablet by mouth daily.    Dispense:  90 tablet    Refill:  3

## 2014-06-04 DIAGNOSIS — Z923 Personal history of irradiation: Secondary | ICD-10-CM

## 2014-06-04 HISTORY — DX: Personal history of irradiation: Z92.3

## 2014-06-04 HISTORY — PX: BREAST LUMPECTOMY: SHX2

## 2014-09-09 ENCOUNTER — Ambulatory Visit (INDEPENDENT_AMBULATORY_CARE_PROVIDER_SITE_OTHER): Payer: Medicare Other | Admitting: Family Medicine

## 2014-09-09 ENCOUNTER — Encounter: Payer: Self-pay | Admitting: Family Medicine

## 2014-09-09 VITALS — BP 160/88 | HR 62 | Temp 98.0°F | Resp 16 | Ht 68.0 in | Wt 184.2 lb

## 2014-09-09 DIAGNOSIS — Z23 Encounter for immunization: Secondary | ICD-10-CM

## 2014-09-09 DIAGNOSIS — Z780 Asymptomatic menopausal state: Secondary | ICD-10-CM | POA: Diagnosis not present

## 2014-09-09 DIAGNOSIS — Z0000 Encounter for general adult medical examination without abnormal findings: Secondary | ICD-10-CM | POA: Diagnosis not present

## 2014-09-09 DIAGNOSIS — E7800 Pure hypercholesterolemia, unspecified: Secondary | ICD-10-CM

## 2014-09-09 DIAGNOSIS — R6889 Other general symptoms and signs: Secondary | ICD-10-CM

## 2014-09-09 DIAGNOSIS — I10 Essential (primary) hypertension: Secondary | ICD-10-CM

## 2014-09-09 DIAGNOSIS — Z1211 Encounter for screening for malignant neoplasm of colon: Secondary | ICD-10-CM | POA: Diagnosis not present

## 2014-09-09 DIAGNOSIS — R7309 Other abnormal glucose: Secondary | ICD-10-CM | POA: Diagnosis not present

## 2014-09-09 LAB — LIPID PANEL
CHOL/HDL RATIO: 4.3 ratio
Cholesterol: 257 mg/dL — ABNORMAL HIGH (ref 0–200)
HDL: 60 mg/dL (ref >=46)
LDL Cholesterol: 172 mg/dL — ABNORMAL HIGH (ref 0–99)
Triglycerides: 124 mg/dL (ref <150)
VLDL: 25 mg/dL (ref 0–40)

## 2014-09-09 LAB — CBC WITH DIFFERENTIAL/PLATELET
BASOS PCT: 1 % (ref 0–1)
Basophils Absolute: 0 10*3/uL (ref 0.0–0.1)
EOS ABS: 0.1 10*3/uL (ref 0.0–0.7)
Eosinophils Relative: 2 % (ref 0–5)
HEMATOCRIT: 39.1 % (ref 36.0–46.0)
Hemoglobin: 12.9 g/dL (ref 12.0–15.0)
Lymphocytes Relative: 20 % (ref 12–46)
Lymphs Abs: 1 10*3/uL (ref 0.7–4.0)
MCH: 29.4 pg (ref 26.0–34.0)
MCHC: 33 g/dL (ref 30.0–36.0)
MCV: 89.1 fL (ref 78.0–100.0)
MONOS PCT: 7 % (ref 3–12)
MPV: 12.2 fL (ref 8.6–12.4)
Monocytes Absolute: 0.3 10*3/uL (ref 0.1–1.0)
Neutro Abs: 3.4 10*3/uL (ref 1.7–7.7)
Neutrophils Relative %: 70 % (ref 43–77)
Platelets: 260 10*3/uL (ref 150–400)
RBC: 4.39 MIL/uL (ref 3.87–5.11)
RDW: 13.2 % (ref 11.5–15.5)
WBC: 4.8 10*3/uL (ref 4.0–10.5)

## 2014-09-09 LAB — COMPLETE METABOLIC PANEL WITH GFR
ALK PHOS: 55 U/L (ref 39–117)
ALT: 18 U/L (ref 0–35)
AST: 18 U/L (ref 0–37)
Albumin: 4.3 g/dL (ref 3.5–5.2)
BUN: 15 mg/dL (ref 6–23)
CALCIUM: 10.5 mg/dL (ref 8.4–10.5)
CO2: 25 meq/L (ref 19–32)
CREATININE: 0.89 mg/dL (ref 0.50–1.10)
Chloride: 105 mEq/L (ref 96–112)
GFR, Est African American: 72 mL/min
GFR, Est Non African American: 63 mL/min
GLUCOSE: 98 mg/dL (ref 70–99)
POTASSIUM: 4.2 meq/L (ref 3.5–5.3)
Sodium: 140 mEq/L (ref 135–145)
Total Bilirubin: 0.8 mg/dL (ref 0.2–1.2)
Total Protein: 7.3 g/dL (ref 6.0–8.3)

## 2014-09-09 LAB — HEMOGLOBIN A1C
Hgb A1c MFr Bld: 6.2 % — ABNORMAL HIGH (ref <5.7)
Mean Plasma Glucose: 131 mg/dL — ABNORMAL HIGH (ref <117)

## 2014-09-09 LAB — TSH: TSH: 1.636 u[IU]/mL (ref 0.350–4.500)

## 2014-09-09 MED ORDER — LOSARTAN POTASSIUM 25 MG PO TABS: 25.0000 mg | ORAL_TABLET | Freq: Every day | ORAL | Status: DC

## 2014-09-09 NOTE — Patient Instructions (Signed)
Keeping You Healthy  Get These Tests  Blood Pressure- Have your blood pressure checked by your healthcare provider at least once a year.  Normal blood pressure is 120/80.  Weight- Have your body mass index (BMI) calculated to screen for obesity.  BMI is a measure of body fat based on height and weight.  You can calculate your own BMI at www.nhlbisupport.com/bmi/  Cholesterol- Have your cholesterol checked every year.  Diabetes- Have your blood sugar checked every year if you have high blood pressure, high cholesterol, a family history of diabetes or if you are overweight.  Pap Smear- Have a pap smear every 1 to 3 years if you have been sexually active.  If you are older than 65 and recent pap smears have been normal you may not need additional pap smears.  In addition, if you have had a hysterectomy  For benign disease additional pap smears are not necessary.  Mammogram-Yearly mammograms are essential for early detection of breast cancer  Screening for Colon Cancer- Colonoscopy starting at age 50. Screening may begin sooner depending on your family history and other health conditions.  Follow up colonoscopy as directed by your Gastroenterologist.  Screening for Osteoporosis- Screening begins at age 65 with bone density scanning, sooner if you are at higher risk for developing Osteoporosis.  Get these medicines  Calcium with Vitamin D- Your body requires 1200-1500 mg of Calcium a day and 800-1000 IU of Vitamin D a day.  You can only absorb 500 mg of Calcium at a time therefore Calcium must be taken in 2 or 3 separate doses throughout the day.  Hormones- Hormone therapy has been associated with increased risk for certain cancers and heart disease.  Talk to your healthcare provider about if you need relief from menopausal symptoms.  Aspirin- Ask your healthcare provider about taking Aspirin to prevent Heart Disease and Stroke.  Get these Immuniztions  Flu shot- Every fall  Pneumonia  shot- Once after the age of 65; if you are younger ask your healthcare provider if you need a pneumonia shot.  Tetanus- Every ten years.  Zostavax- Once after the age of 60 to prevent shingles.  Take these steps  Don't smoke- Your healthcare provider can help you quit. For tips on how to quit, ask your healthcare provider or go to www.smokefree.gov or call 1-800 QUIT-NOW.  Be physically active- Exercise 5 days a week for a minimum of 30 minutes.  If you are not already physically active, start slow and gradually work up to 30 minutes of moderate physical activity.  Try walking, dancing, bike riding, swimming, etc.  Eat a healthy diet- Eat a variety of healthy foods such as fruits, vegetables, whole grains, low fat milk, low fat cheeses, yogurt, lean meats, chicken, fish, eggs, dried beans, tofu, etc.  For more information go to www.thenutritionsource.org  Dental visit- Brush and floss teeth twice daily; visit your dentist twice a year.  Eye exam- Visit your Optometrist or Ophthalmologist yearly.  Drink alcohol in moderation- Limit alcohol intake to one drink or less a day.  Never drink and drive.  Depression- Your emotional health is as important as your physical health.  If you're feeling down or losing interest in things you normally enjoy, please talk to your healthcare provider.  Seat Belts- can save your life; always wear one  Smoke/Carbon Monoxide detectors- These detectors need to be installed on the appropriate level of your home.  Replace batteries at least once a year.  Violence- If anyone   is threatening or hurting you, please tell your healthcare provider.  Living Will/ Health care power of attorney- Discuss with your healthcare provider and family.       Mediterranean Diet  Why follow it? Research shows.   Those who follow the Mediterranean diet have a reduced risk of heart disease    The diet is associated with a reduced incidence of Parkinson's and Alzheimer's  diseases   People following the diet may have longer life expectancies and lower rates of chronic diseases    The Dietary Guidelines for Americans recommends the Mediterranean diet as an eating plan to promote health and prevent disease  What Is the Mediterranean Diet?    Healthy eating plan based on typical foods and recipes of Mediterranean-style cooking   The diet is primarily a plant based diet; these foods should make up a majority of meals   Starches - Plant based foods should make up a majority of meals - They are an important sources of vitamins, minerals, energy, antioxidants, and fiber - Choose whole grains, foods high in fiber and minimally processed items  - Typical grain sources include wheat, oats, barley, corn, brown rice, bulgar, farro, millet, polenta, couscous  - Various types of beans include chickpeas, lentils, fava beans, black beans, white beans   Fruits  Veggies - Large quantities of antioxidant rich fruits & veggies; 6 or more servings  - Vegetables can be eaten raw or lightly drizzled with oil and cooked  - Vegetables common to the traditional Mediterranean Diet include: artichokes, arugula, beets, broccoli, brussel sprouts, cabbage, carrots, celery, collard greens, cucumbers, eggplant, kale, leeks, lemons, lettuce, mushrooms, okra, onions, peas, peppers, potatoes, pumpkin, radishes, rutabaga, shallots, spinach, sweet potatoes, turnips, zucchini - Fruits common to the Mediterranean Diet include: apples, apricots, avocados, cherries, clementines, dates, figs, grapefruits, grapes, melons, nectarines, oranges, peaches, pears, pomegranates, strawberries, tangerines  Fats - Replace butter and margarine with healthy oils, such as olive oil, canola oil, and tahini  - Limit nuts to no more than a handful a day  - Nuts include walnuts, almonds, pecans, pistachios, pine nuts  - Limit or avoid candied, honey roasted or heavily salted nuts - Olives are central to the Mediterranean  diet - can be eaten whole or used in a variety of dishes   Meats Protein - Limiting red meat: no more than a few times a month - When eating red meat: choose lean cuts and keep the portion to the size of deck of cards - Eggs: approx. 0 to 4 times a week  - Fish and lean poultry: at least 2 a week  - Healthy protein sources include, chicken, turkey, lean beef, lamb - Increase intake of seafood such as tuna, salmon, trout, mackerel, shrimp, scallops - Avoid or limit high fat processed meats such as sausage and bacon  Dairy - Include moderate amounts of low fat dairy products  - Focus on healthy dairy such as fat free yogurt, skim milk, low or reduced fat cheese - Limit dairy products higher in fat such as whole or 2% milk, cheese, ice cream  Alcohol - Moderate amounts of red wine is ok  - No more than 5 oz daily for women (all ages) and men older than age 65  - No more than 10 oz of wine daily for men younger than 65  Other - Limit sweets and other desserts  - Use herbs and spices instead of salt to flavor foods  - Herbs and spices common to   the traditional Mediterranean Diet include: basil, bay leaves, chives, cloves, cumin, fennel, garlic, lavender, marjoram, mint, oregano, parsley, pepper, rosemary, sage, savory, sumac, tarragon, thyme   It's not just a diet, it's a lifestyle:    The Mediterranean diet includes lifestyle factors typical of those in the region    Foods, drinks and meals are best eaten with others and savored   Daily physical activity is important for overall good health   This could be strenuous exercise like running and aerobics   This could also be more leisurely activities such as walking, housework, yard-work, or taking the stairs   Moderation is the key; a balanced and healthy diet accommodates most foods and drinks   Consider portion sizes and frequency of consumption of certain foods   Meal Ideas & Options:    Breakfast:  o Whole wheat toast or whole wheat English  muffins with peanut butter & hard boiled egg o Steel cut oats topped with apples & cinnamon and skim milk  o Fresh fruit: banana, strawberries, melon, berries, peaches  o Smoothies: strawberries, bananas, greek yogurt, peanut butter o Low fat greek yogurt with blueberries and granola  o Egg white omelet with spinach and mushrooms o Breakfast couscous: whole wheat couscous, apricots, skim milk, cranberries    Sandwiches:  o Hummus and grilled vegetables (peppers, zucchini, squash) on whole wheat bread   o Grilled chicken on whole wheat pita with lettuce, tomatoes, cucumbers or tzatziki  o Tuna salad on whole wheat bread: tuna salad made with greek yogurt, olives, red peppers, capers, green onions o Garlic rosemary lamb pita: lamb sauted with garlic, rosemary, salt & pepper; add lettuce, cucumber, greek yogurt to pita - flavor with lemon juice and black pepper    Seafood:  o Mediterranean grilled salmon, seasoned with garlic, basil, parsley, lemon juice and black pepper o Shrimp, lemon, and spinach whole-grain pasta salad made with low fat greek yogurt  o Seared scallops with lemon orzo  o Seared tuna steaks seasoned salt, pepper, coriander topped with tomato mixture of olives, tomatoes, olive oil, minced garlic, parsley, green onions and cappers    Meats:  o Herbed greek chicken salad with kalamata olives, cucumber, feta  o Red bell peppers stuffed with spinach, bulgur, lean ground beef (or lentils) & topped with feta   o Kebabs: skewers of chicken, tomatoes, onions, zucchini, squash  o Turkey burgers: made with red onions, mint, dill, lemon juice, feta cheese topped with roasted red peppers   Vegetarian o Cucumber salad: cucumbers, artichoke hearts, celery, red onion, feta cheese, tossed in olive oil & lemon juice  o Hummus and whole grain pita points with a greek salad (lettuce, tomato, feta, olives, cucumbers, red onion) o Lentil soup with celery, carrots made with vegetable broth,  garlic, salt and pepper  o Tabouli salad: parsley, bulgur, mint, scallions, cucumbers, tomato, radishes, lemon juice, olive oil, salt and pepper. o   

## 2014-09-09 NOTE — Progress Notes (Signed)
Subjective:    Patient ID: [name], female    DOB: 1935-04-26, 80 y.o.   MRN: [id number]  HPI  This 80 y.o. Female is here for [facility] Subsequent CPE w/ labs. She has HTN but thinks ACEI is causing nocturnal NP cough. Otherwise , pt feels well. She is retired and she and her husband garden; pt cans vegetables. Pt c/o cough, thought to be due to ACEI BP medication.  HCM: Current except Immnunizations- pt declines Flu and pneumonia vaccines.  Patient Active Problem List   Diagnosis Date Noted  . Pure hypercholesterolemia 04/08/2014  . Breast asymmetry 09/06/2013  . Anxiety   . PUD (peptic ulcer disease)   . HTN (hypertension), benign 06/29/2011  . GERD (gastroesophageal reflux disease) 06/29/2011    Prior to Admission medications   Medication Sig Start Date End Date Taking? Authorizing Provider  Ascorbic Acid (VITAMIN C) 1000 MG tablet Take 1,000 mg by mouth daily.   Yes Historical Provider, MD  aspirin 81 MG tablet Take 81 mg by mouth daily.   Yes Historical Provider, MD  fish oil-omega-3 fatty acids 1000 MG capsule Take 1 g by mouth daily.   Yes Historical Provider, MD  GARLIC PO Take by mouth daily.   Yes Historical Provider, MD  Multiple Vitamin (MULTIVITAMIN) tablet Take 1 tablet by mouth daily.   Yes Historical Provider, MD  Lisinopril- hydrochlorothiazide (PRINIZIDE, ZESTORETIC) 10- 12.5 MG tablet Take 1 tablet (25 mg total) by mouth daily. 09/05/2013  Yes Barton Fanny, MD    History reviewed. No pertinent past surgical history.  History   Social History  . Marital Status: Married    Spouse Name: N/A  . Number of Children: N/A  . Years of Education: N/A   Occupational History  . retired    Social History Main Topics  . Smoking status: Never Smoker   . Smokeless tobacco: Not on file  . Alcohol Use: No  . Drug Use: No  . Sexual Activity: Not on file   Other Topics Concern  . Not on file   Social History Narrative    Family History  Problem  Relation Age of Onset  . Cancer Mother     pancreastic  . Stroke Father   . Hypertension Brother   . Hypertension Sister   . Hypertension Brother   . Heart disease Brother   . Hypertension Sister     Review of Systems  Constitutional: Negative.   HENT: Positive for tinnitus.   Eyes: Negative.   Respiratory: Positive for cough.        NP cough, increased at night w/ scratchy throat.  Cardiovascular: Negative.   Gastrointestinal: Negative.   Endocrine: Positive for cold intolerance.  Genitourinary: Negative.   Musculoskeletal: Negative.   Skin: Negative.   Allergic/Immunologic: Negative.   Neurological: Negative.   Hematological: Negative.   Psychiatric/Behavioral: Negative.       Objective:   Physical Exam  Constitutional: She is oriented to person, place, and time. Vital signs are normal. She appears well-developed and well-nourished. No distress.  Blood pressure 160/88, pulse 62, temperature 98 F (36.7 C), temperature source Oral, resp. rate 16, height 5\' 8"  (1.727 m), weight 184 lb 3.2 oz (83.553 kg), SpO2 99 %.   HENT:  Head: Normocephalic and atraumatic.  Right Ear: Hearing, tympanic membrane, external ear and ear canal normal.  Left Ear: Hearing, tympanic membrane, external ear and ear canal normal.  Nose: Nose normal. No nasal deformity or septal deviation.  Mouth/Throat: Uvula is midline, oropharynx is clear and moist and mucous membranes are normal. No oral lesions. Normal dentition.  Eyes: Conjunctivae, EOM and lids are normal. Pupils are equal, round, and reactive to light. No scleral icterus.  Wears corrective lenses.  Neck: Trachea normal, normal range of motion, full passive range of motion without pain and phonation normal. Neck supple. No JVD present. No spinous process tenderness and no muscular tenderness present. Carotid bruit is not present. No thyroid mass and no thyromegaly present.  Cardiovascular: Normal rate, regular rhythm, S2 normal, normal heart  sounds, intact distal pulses and normal pulses.   No extrasystoles are present. PMI is not displaced.  Exam reveals no gallop and no friction rub.   No murmur heard. Pulmonary/Chest: Effort normal and breath sounds normal. No respiratory distress. Right breast exhibits mass. Right breast exhibits no inverted nipple, no nipple discharge, no skin change and no tenderness. Left breast exhibits mass. Left breast exhibits no inverted nipple, no nipple discharge, no skin change and no tenderness. Breasts are asymmetrical.  R breast larger than L (this is stable according to pt); dense tissue in R breast and small 1 cm mobile cystic lesion in medial L breast.  Abdominal: Soft. Normal appearance and bowel sounds are normal. She exhibits no distension, no abdominal bruit, no pulsatile midline mass and no mass. There is no hepatosplenomegaly. There is no tenderness. There is no guarding and no CVA tenderness.  Genitourinary:  Deferred.  Musculoskeletal:       Cervical back: She exhibits deformity. She exhibits normal range of motion, no tenderness, no bony tenderness, no pain and no spasm.       Thoracic back: Normal.       Lumbar back: Normal.  Mild kyphosis noted. Remainder of exam unremarkable; mild DJD changes in hands and feet.  Lymphadenopathy:       Head (right side): No submental, no submandibular, no tonsillar, no preauricular, no posterior auricular and no occipital adenopathy present.       Head (left side): No submental, no submandibular, no tonsillar, no preauricular, no posterior auricular and no occipital adenopathy present.    She has no cervical adenopathy.    She has no axillary adenopathy.       Right: No inguinal and no supraclavicular adenopathy present.       Left: No inguinal and no supraclavicular adenopathy present.  Neurological: She is alert and oriented to person, place, and time. She has normal strength and normal reflexes. She displays no atrophy and no tremor. No cranial nerve  deficit or sensory deficit. She exhibits normal muscle tone. She displays a negative Romberg sign. Coordination and gait normal.  GET UP and GO test: time= 7 seconds.  Skin: Skin is warm, dry and intact. No ecchymosis and no rash noted. She is not diaphoretic. No cyanosis or erythema. Nails show no clubbing.  Psychiatric: She has a normal mood and affect. Her speech is normal and behavior is normal. Judgment and thought content normal. Cognition and memory are normal.  Nursing note and vitals reviewed.   A1c= 6.2%.     Assessment & Plan:  Medicare annual wellness visit, subsequent - Plan: POCT urinalysis dipstick, CBC with Differential/Platelet  HTN (hypertension), benign -  Change medication to losartan 50 mg 1 tablet daily to eradicate cough.  Plan: POCT urinalysis dipstick, COMPLETE METABOLIC PANEL WITH GFR, CBC with Differential/Platelet  Pure hypercholesterolemia - Plan: Hemoglobin A1c, Lipid panel, CBC with Differential/Platelet  Cold intolerance - Plan: TSH,  CBC with Differential/Platelet  Screening for colon cancer - Plan: IFOBT POC (occult bld, rslt in office)  Need for TD vaccine - Plan: Td vaccine greater than or equal to 7yo preservative free IM  Postmenopausal estrogen deficiency - Plan: DG Bone Density   Meds ordered this encounter  Medications  . losartan (COZAAR) 25 MG tablet    Sig: Take 1 tablet (25 mg total) by mouth daily.    Dispense:  30 tablet    Refill:  3

## 2014-09-12 ENCOUNTER — Encounter: Payer: Self-pay | Admitting: Family Medicine

## 2014-09-13 ENCOUNTER — Other Ambulatory Visit: Payer: Self-pay | Admitting: Family Medicine

## 2014-09-13 DIAGNOSIS — E2839 Other primary ovarian failure: Secondary | ICD-10-CM

## 2014-09-13 LAB — IFOBT (OCCULT BLOOD): IMMUNOLOGICAL FECAL OCCULT BLOOD TEST: NEGATIVE

## 2014-09-14 ENCOUNTER — Encounter: Payer: Self-pay | Admitting: Family Medicine

## 2014-09-14 ENCOUNTER — Telehealth: Payer: Self-pay | Admitting: *Deleted

## 2014-09-14 ENCOUNTER — Telehealth: Payer: Self-pay

## 2014-09-14 DIAGNOSIS — C50512 Malignant neoplasm of lower-outer quadrant of left female breast: Secondary | ICD-10-CM

## 2014-09-14 LAB — POCT UA - MICROSCOPIC ONLY
Casts, Ur, LPF, POC: NEGATIVE
Crystals, Ur, HPF, POC: NEGATIVE
Mucus, UA: POSITIVE
Yeast, UA: NEGATIVE

## 2014-09-14 LAB — POCT URINALYSIS DIPSTICK
BILIRUBIN UA: NEGATIVE
Blood, UA: NEGATIVE
Glucose, UA: NEGATIVE
KETONES UA: NEGATIVE
Nitrite, UA: NEGATIVE
PH UA: 8.5
Protein, UA: NEGATIVE
Spec Grav, UA: 1.015
Urobilinogen, UA: 0.2

## 2014-09-14 NOTE — Telephone Encounter (Signed)
Pt calling about labs.  She is on MyChart

## 2014-09-14 NOTE — Addendum Note (Signed)
Addended by: Kem Boroughs D on: 09/14/2014 01:17 PM   Modules accepted: Orders

## 2014-09-14 NOTE — Telephone Encounter (Signed)
Pts urine was not put in computer the day she was here.  It was on paper and results were put into computer today.

## 2014-09-14 NOTE — Telephone Encounter (Signed)
Pt is on MyChart so I will send her a message that she needs to come in and give [place] another CC urine for repeat UA , micro and culture.

## 2014-09-17 ENCOUNTER — Telehealth: Payer: Self-pay

## 2014-09-17 DIAGNOSIS — N6002 Solitary cyst of left breast: Secondary | ICD-10-CM

## 2014-09-17 NOTE — Telephone Encounter (Signed)
Patient says she has a lump in her left breast, but the order says right breast. Please advise

## 2014-09-18 NOTE — Telephone Encounter (Signed)
Changed order.

## 2014-09-20 ENCOUNTER — Telehealth: Payer: Self-pay

## 2014-09-20 DIAGNOSIS — N6002 Solitary cyst of left breast: Secondary | ICD-10-CM

## 2014-09-20 NOTE — Telephone Encounter (Signed)
Roberta at [facility] requesting patients order for a mmg to be changed to a 3D Diagnostic ([facility] [phone]). She also request the order to indicate lump on left breast and th exact location. Her call back number is [phone]

## 2014-09-20 NOTE — Telephone Encounter (Signed)
Done

## 2014-09-21 ENCOUNTER — Other Ambulatory Visit: Payer: Self-pay | Admitting: Family Medicine

## 2014-09-21 DIAGNOSIS — N6002 Solitary cyst of left breast: Secondary | ICD-10-CM

## 2014-09-24 ENCOUNTER — Other Ambulatory Visit: Payer: Self-pay

## 2014-09-24 ENCOUNTER — Other Ambulatory Visit: Payer: Self-pay | Admitting: Family Medicine

## 2014-09-24 DIAGNOSIS — N6002 Solitary cyst of left breast: Secondary | ICD-10-CM

## 2014-09-27 ENCOUNTER — Ambulatory Visit
Admission: RE | Admit: 2014-09-27 | Discharge: 2014-09-27 | Disposition: A | Discharge status: Home / Self Care | Payer: Medicare Other | Source: Ambulatory Visit | Attending: Family Medicine | Admitting: Family Medicine

## 2014-09-27 ENCOUNTER — Other Ambulatory Visit: Payer: Self-pay | Admitting: Family Medicine

## 2014-09-27 DIAGNOSIS — N6002 Solitary cyst of left breast: Secondary | ICD-10-CM

## 2014-09-27 DIAGNOSIS — M810 Age-related osteoporosis without current pathological fracture: Secondary | ICD-10-CM | POA: Diagnosis not present

## 2014-09-27 DIAGNOSIS — E2839 Other primary ovarian failure: Secondary | ICD-10-CM

## 2014-09-27 DIAGNOSIS — N63 Unspecified lump in breast: Secondary | ICD-10-CM | POA: Diagnosis not present

## 2014-09-27 DIAGNOSIS — N6320 Unspecified lump in the left breast, unspecified quadrant: Secondary | ICD-10-CM

## 2014-10-05 ENCOUNTER — Ambulatory Visit
Admission: RE | Admit: 2014-10-05 | Discharge: 2014-10-05 | Disposition: A | Discharge status: Home / Self Care | Payer: Medicare Other | Source: Ambulatory Visit | Attending: Family Medicine | Admitting: Family Medicine

## 2014-10-05 DIAGNOSIS — D0512 Intraductal carcinoma in situ of left breast: Secondary | ICD-10-CM | POA: Diagnosis not present

## 2014-10-05 DIAGNOSIS — N6320 Unspecified lump in the left breast, unspecified quadrant: Secondary | ICD-10-CM

## 2014-10-05 DIAGNOSIS — N6002 Solitary cyst of left breast: Secondary | ICD-10-CM

## 2014-10-05 DIAGNOSIS — C50412 Malignant neoplasm of upper-outer quadrant of left female breast: Secondary | ICD-10-CM | POA: Diagnosis not present

## 2014-10-05 DIAGNOSIS — N63 Unspecified lump in breast: Secondary | ICD-10-CM | POA: Diagnosis not present

## 2014-10-11 ENCOUNTER — Telehealth: Payer: Self-pay | Admitting: *Deleted

## 2014-10-11 ENCOUNTER — Encounter: Payer: Self-pay | Admitting: Family Medicine

## 2014-10-11 DIAGNOSIS — C50512 Malignant neoplasm of lower-outer quadrant of left female breast: Secondary | ICD-10-CM

## 2014-10-11 DIAGNOSIS — Z170 Estrogen receptor positive status [ER+]: Secondary | ICD-10-CM

## 2014-10-11 HISTORY — DX: Malignant neoplasm of lower-outer quadrant of left female breast: C50.512

## 2014-10-11 NOTE — Telephone Encounter (Signed)
Confirmed [facility] for 10/13/14 at 1200 .  Instructions and contact information given.

## 2014-10-13 ENCOUNTER — Encounter: Payer: Self-pay | Admitting: Physical Therapy

## 2014-10-13 ENCOUNTER — Other Ambulatory Visit (HOSPITAL_BASED_OUTPATIENT_CLINIC_OR_DEPARTMENT_OTHER): Payer: Medicare Other

## 2014-10-13 ENCOUNTER — Ambulatory Visit: Payer: Medicare Other

## 2014-10-13 ENCOUNTER — Encounter: Payer: Self-pay | Admitting: Oncology

## 2014-10-13 ENCOUNTER — Ambulatory Visit: Payer: Medicare Other | Attending: General Surgery | Admitting: Physical Therapy

## 2014-10-13 ENCOUNTER — Telehealth: Payer: Self-pay | Admitting: Oncology

## 2014-10-13 ENCOUNTER — Ambulatory Visit
Admission: RE | Admit: 2014-10-13 | Discharge: 2014-10-13 | Disposition: A | Discharge status: Home / Self Care | Payer: Medicare Other | Source: Ambulatory Visit | Attending: Radiation Oncology | Admitting: Radiation Oncology

## 2014-10-13 ENCOUNTER — Other Ambulatory Visit: Payer: Self-pay | Admitting: General Surgery

## 2014-10-13 ENCOUNTER — Other Ambulatory Visit: Payer: Self-pay | Admitting: *Deleted

## 2014-10-13 ENCOUNTER — Ambulatory Visit (HOSPITAL_BASED_OUTPATIENT_CLINIC_OR_DEPARTMENT_OTHER): Payer: Medicare Other | Admitting: Oncology

## 2014-10-13 VITALS — BP 200/71 | HR 72 | Temp 97.8°F | Resp 18 | Ht 68.0 in | Wt 182.2 lb

## 2014-10-13 DIAGNOSIS — Z808 Family history of malignant neoplasm of other organs or systems: Secondary | ICD-10-CM | POA: Diagnosis not present

## 2014-10-13 DIAGNOSIS — C50412 Malignant neoplasm of upper-outer quadrant of left female breast: Secondary | ICD-10-CM

## 2014-10-13 DIAGNOSIS — C50512 Malignant neoplasm of lower-outer quadrant of left female breast: Secondary | ICD-10-CM

## 2014-10-13 DIAGNOSIS — G4700 Insomnia, unspecified: Secondary | ICD-10-CM

## 2014-10-13 DIAGNOSIS — Z170 Estrogen receptor positive status [ER+]: Secondary | ICD-10-CM

## 2014-10-13 DIAGNOSIS — C50912 Malignant neoplasm of unspecified site of left female breast: Secondary | ICD-10-CM | POA: Insufficient documentation

## 2014-10-13 DIAGNOSIS — M4004 Postural kyphosis, thoracic region: Secondary | ICD-10-CM | POA: Diagnosis not present

## 2014-10-13 LAB — CBC WITH DIFFERENTIAL/PLATELET
BASO%: 0.7 % (ref 0.0–2.0)
Basophils Absolute: 0 10*3/uL (ref 0.0–0.1)
EOS%: 2.4 % (ref 0.0–7.0)
Eosinophils Absolute: 0.1 10*3/uL (ref 0.0–0.5)
HEMATOCRIT: 37 % (ref 34.8–46.6)
HGB: 12 g/dL (ref 11.6–15.9)
LYMPH%: 21.7 % (ref 14.0–49.7)
MCH: 29.6 pg (ref 25.1–34.0)
MCHC: 32.4 g/dL (ref 31.5–36.0)
MCV: 91.4 fL (ref 79.5–101.0)
MONO#: 0.4 10*3/uL (ref 0.1–0.9)
MONO%: 7.6 % (ref 0.0–14.0)
NEUT#: 3.6 10*3/uL (ref 1.5–6.5)
NEUT%: 67.6 % (ref 38.4–76.8)
PLATELETS: 218 10*3/uL (ref 145–400)
RBC: 4.05 10*6/uL (ref 3.70–5.45)
RDW: 12.9 % (ref 11.2–14.5)
WBC: 5.4 10*3/uL (ref 3.9–10.3)
lymph#: 1.2 10*3/uL (ref 0.9–3.3)

## 2014-10-13 LAB — COMPREHENSIVE METABOLIC PANEL (CC13)
ALT: 15 U/L (ref 0–55)
ANION GAP: 11 meq/L (ref 3–11)
AST: 19 U/L (ref 5–34)
Albumin: 3.8 g/dL (ref 3.5–5.0)
Alkaline Phosphatase: 64 U/L (ref 40–150)
BUN: 21.8 mg/dL (ref 7.0–26.0)
CALCIUM: 10.6 mg/dL — AB (ref 8.4–10.4)
CHLORIDE: 109 meq/L (ref 98–109)
CO2: 24 meq/L (ref 22–29)
CREATININE: 1.2 mg/dL — AB (ref 0.6–1.1)
EGFR: 51 mL/min/{1.73_m2} — ABNORMAL LOW (ref >90)
Glucose: 111 mg/dl (ref 70–140)
POTASSIUM: 3.9 meq/L (ref 3.5–5.1)
Sodium: 144 mEq/L (ref 136–145)
Total Bilirubin: 0.49 mg/dL (ref 0.20–1.20)
Total Protein: 7.1 g/dL (ref 6.4–8.3)

## 2014-10-13 NOTE — Progress Notes (Signed)
[place]  Telephone:(336) [phone] Fax:([phone]) [phone]     ID: JONAI WEYLAND DOB: 1934-09-27  MR#: [id number]  FVC#:[id number]  Patient Care Team: Barton Fanny, MD as PCP - General (Family Medicine) Clarene Essex, MD as Consulting Physician (Gastroenterology) Autumn Messing III, MD as Consulting Physician (General Surgery) Chauncey Cruel, MD as Consulting Physician (Oncology) Eppie Gibson, MD as Attending Physician (Radiation Oncology) Mauro Kaufmann, RN as Registered Nurse Rockwell Germany, RN as Registered Nurse Holley Bouche, NP as Nurse Practitioner (Nurse Practitioner) PCP: Ellsworth Lennox, MD OTHER MD:  CHIEF COMPLAINT: Estrogen receptor positive breast cancer  CURRENT TREATMENT: Awaiting definitive surgery   BREAST CANCER HISTORY: [place] noted a palpable mass in the left breast and brought it to her primary care physician's attention. On 09/27/2014 the patient underwent bilateral diagnostic mammography with tomosynthesis and left breast ultrasonography. The breast density was category B. In the outer left breast area there was an irregularly shaped mass not associated with suspicious microcalcifications or distortion. The previously described large of fibroadenolipoma in the right breast was unchanged. On physical exam there was a firm palpable mass at the 3:30 position in the left breast 2 cm prone the nipple which, on targeted ultrasound, was irregular, hypoechoic, and measured 2.0 cm. Ultrasound of the left axilla was negative.  Biopsy of this mass 10/05/2014 showed ([id number]) an invasive ductal carcinoma him a grade 1, estrogen receptor 90% positive, progesterone receptor 90% positive, both with strong staining intensity, with an MIB-1 of 5%, and no HER-2 amplification, the signals ratio being 1.06 and the number per cell 1.70.  The patient's subsequent history is as detailed below  INTERVAL HISTORY: [name] was evaluated in the multidisciplinary  breast cancer clinic 10/13/2014 accompanied by her son [name]. Her case was also presented at the multidisciplinary breast cancer conference that same morning. At that time a preliminary plan was suggested including breast conserving surgery followed by radiation and then hormones. It was felt no chemotherapy would be called for.  REVIEW OF SYSTEMS: Aside from the mass itself, there were no specific symptoms leading to the original mammogram, which was routinely scheduled. The patient denies unusual headaches, visual changes, nausea, vomiting, stiff neck, dizziness, or gait imbalance. There has been no cough, phlegm production, or pleurisy, no chest pain or pressure, and no change in bowel or bladder habits. The patient denies fever, rash, bleeding, unexplained fatigue or unexplained weight loss. The patient does complain of mild insomnia and some ringing in her ears at times. A detailed review of systems was otherwise entirely negative.     Marland Kitchen PAST MEDICAL HISTORY: Past Medical History  Diagnosis Date  . Hypertension   . Anxiety   . PUD (peptic ulcer disease)   . GERD (gastroesophageal reflux disease)   . Breast cancer of lower-outer quadrant of left female breast 10/11/2014  . Breast cancer     PAST SURGICAL HISTORY: History reviewed. No pertinent past surgical history.  FAMILY HISTORY Family History  Problem Relation Age of Onset  . Cancer Mother     pancreastic  . Stroke Father   . Hypertension Brother   . Hypertension Sister   . Hypertension Brother   . Heart disease Brother   . Hypertension Sister    the patient's father died following a stroke at age 30. The patient's mother died from pancreatic cancer the age of 44. The patient had 5 brothers, 2 sisters. There is no history of breast or ovarian cancer in the  family to the patient's knowledge   GYNECOLOGIC HISTORY:  No LMP recorded. Patient is postmenopausal.  menarche age 17, first live birth age 35, the patient is [employer] P4. She  went through the change of life in her late 17s. She took hormone replacement for approximately 3 years. She also took oral contraceptives for a few years remotely, with no complications.   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 Her husband [name] is a Horticulturist, commercial. Daughter [name] lives in  [place], near [place], where she Biomedical engineer. Son [name] lives in [place] and works in Building control surveyor. Son [name] also lives in [place]. He is a Environmental education officer with [employer]. Daughter [name] lives in [place] and teaches in [place] ([place] The patient has 11 grandchildren. She attends a local [place] DIRECTIVES:  the patient has named her son [name] as healthcare power of attorney. He can be reached at [phone].    HEALTH MAINTENANCE: History  Substance Use Topics  . Smoking status: Never Smoker   . Smokeless tobacco: Not on file  . Alcohol Use: No     Colonoscopy:  PAP:  Bone density:  Lipid panel:  No Known Allergies  Current Outpatient Prescriptions  Medication Sig Dispense Refill  . Ascorbic Acid (VITAMIN C) 1000 MG tablet Take 1,000 mg by mouth daily.    Marland Kitchen aspirin 81 MG tablet Take 81 mg by mouth daily.    . fish oil-omega-3 fatty acids 1000 MG capsule Take 1 g by mouth daily.    Marland Kitchen GARLIC PO Take by mouth daily.    Marland Kitchen losartan (COZAAR) 25 MG tablet Take 1 tablet (25 mg total) by mouth daily. 30 tablet 3  . Multiple Vitamin (MULTIVITAMIN) tablet Take 1 tablet by mouth daily.     No current facility-administered medications for this visit.    OBJECTIVE: older African-American woman who appears well  Filed Vitals:   10/13/14 1255  BP: 200/71  Pulse:   Temp:   Resp:      Body mass index is 27.71 kg/(m^2).    ECOG FS:0 - Asymptomatic  Ocular: Sclerae unicterEOMs intac Oropharynx cleand moist  No cervical or  supraclavicular adenopathy Lungs no rales or rhonchi, good excursion bilaterally Heart regular rate and rhythm, no murmur appreciated Abd soft, nontender, positive bowel sounds MSKmild kyphosis but no focal spinal tenderness, no joint edema Neuro: non-focal, well-oriented, appropriate affect Breasts:  the right breast is unremarkable. The left breast is status post recent biopsy. There is a mass associated with the biopsy measuring perhaps 1/2 cm. There are no other skin or nipple changes of concern. The left axilla is benign.    LAB RESULTS:  CMP     Component Value Date/Time   NA 144 10/13/2014 1231   NA 140 09/09/2014 1150   K 3.9 10/13/2014 1231   K 4.2 09/09/2014 1150   CL 105 09/09/2014 1150   CO2 24 10/13/2014 1231   CO2 25 09/09/2014 1150   GLUCOSE 111 10/13/2014 1231   GLUCOSE 98 09/09/2014 1150   BUN 21.8 10/13/2014 1231   BUN 15 09/09/2014 1150   CREATININE 1.2* 10/13/2014 1231   CREATININE 0.89 09/09/2014 1150   CREATININE 0.78 02/02/2011 0700   CALCIUM 10.6* 10/13/2014 1231   CALCIUM 10.5 09/09/2014 1150   PROT 7.1 10/13/2014 1231   PROT 7.3 09/09/2014 1150   ALBUMIN 3.8 10/13/2014  1231   ALBUMIN 4.3 09/09/2014 1150   AST 19 10/13/2014 1231   AST 18 09/09/2014 1150   ALT 15 10/13/2014 1231   ALT 18 09/09/2014 1150   ALKPHOS 64 10/13/2014 1231   ALKPHOS 55 09/09/2014 1150   BILITOT 0.49 10/13/2014 1231   BILITOT 0.8 09/09/2014 1150   GFRNONAA 63 09/09/2014 1150   GFRNONAA >60 02/02/2011 0700   GFRAA 72 09/09/2014 1150   GFRAA >60 02/02/2011 0700    INo results found for: SPEP, UPEP  Lab Results  Component Value Date   WBC 5.4 10/13/2014   NEUTROABS 3.6 10/13/2014   HGB 12.0 10/13/2014   HCT 37.0 10/13/2014   MCV 91.4 10/13/2014   PLT 218 10/13/2014      Chemistry      Component Value Date/Time   NA 144 10/13/2014 1231   NA 140 09/09/2014 1150   K 3.9 10/13/2014 1231   K 4.2 09/09/2014 1150   CL 105 09/09/2014 1150   CO2 24 10/13/2014  1231   CO2 25 09/09/2014 1150   BUN 21.8 10/13/2014 1231   BUN 15 09/09/2014 1150   CREATININE 1.2* 10/13/2014 1231   CREATININE 0.89 09/09/2014 1150   CREATININE 0.78 02/02/2011 0700      Component Value Date/Time   CALCIUM 10.6* 10/13/2014 1231   CALCIUM 10.5 09/09/2014 1150   ALKPHOS 64 10/13/2014 1231   ALKPHOS 55 09/09/2014 1150   AST 19 10/13/2014 1231   AST 18 09/09/2014 1150   ALT 15 10/13/2014 1231   ALT 18 09/09/2014 1150   BILITOT 0.49 10/13/2014 1231   BILITOT 0.8 09/09/2014 1150       No results found for: LABCA2  No components found for: LABCA125  No results for input(s): INR in the last 168 hours.  Urinalysis    Component Value Date/Time   COLORURINE YELLOW 02/01/2011 1158   APPEARANCEUR CLEAR 02/01/2011 1158   LABSPEC 1.008 02/01/2011 1158   PHURINE 8.0 02/01/2011 1158   GLUCOSEU NEGATIVE 02/01/2011 1158   HGBUR NEGATIVE 02/01/2011 1158   BILIRUBINUR neg 09/14/2014 1308   BILIRUBINUR NEGATIVE 02/01/2011 1158   KETONESUR NEGATIVE 02/01/2011 1158   PROTEINUR neg 09/14/2014 1308   PROTEINUR NEGATIVE 02/01/2011 1158   UROBILINOGEN 0.2 09/14/2014 1308   UROBILINOGEN 0.2 02/01/2011 1158   NITRITE neg 09/14/2014 1308   NITRITE NEGATIVE 02/01/2011 1158   LEUKOCYTESUR moderate (2+) 09/14/2014 1308    STUDIES: Dg Bone Density  09/27/2014   CLINICAL DATA:  Postmenopausal. Baseline exam. The patient takes vitamin-D and calcium supplements. History of estrogen replacement therapy in the past.  EXAM: DUAL X-RAY ABSORPTIOMETRY (DXA) FOR BONE MINERAL DENSITY  FINDINGS: AP LUMBAR SPINE L3, L4  Bone Mineral Density (BMD):  0.541 g/cm2  Young Adult T-Score:  -5.1  Z-Score:  -3.1  Left FEMUR neck  Bone Mineral Density (BMD):  0.569 g/cm2  Young Adult T-Score: -2.5  Z-Score:  -1.0  ASSESSMENT: Patient's diagnostic category is osteoporosis by WHO Criteria.  FRACTURE RISK: Increased  FRAX: World Health Organization FRAX assessment of absolute fracture risk is not  calculated for this patient because the patient has T-scores below -2.5.  COMPARISON: None  Effective therapies are available in the form of bisphosphonates, selective estrogen receptor modulators, biologic agents, and hormone replacement therapy (for women). All patients should ensure an adequate intake of dietary calcium (1200 mg daily) and vitamin D (800 IU daily) unless contraindicated.  All treatment decisions require clinical judgment and consideration of individual patient factors, including  patient preferences, co-morbidities, previous drug use, risk factors not captured in the FRAX model (e.g., frailty, falls, vitamin D deficiency, increased bone turnover, interval significant decline in bone density) and possible under- or over-estimation of fracture risk by FRAX.  The National Osteoporosis Foundation recommends that FDA-approved medical therapies be considered in postmenopausal women and men age 26 or older with a:  1. Hip or vertebral (clinical or morphometric) fracture.  2. T-score of -2.5 or lower at the spine or hip.  3. Ten-year fracture probability by FRAX of 3% or greater for hip fracture or 20% or greater for major osteoporotic fracture.  People with diagnosed cases of osteoporosis or at high risk for fracture should have regular bone mineral density tests. For patients eligible for Medicare, routine testing is allowed once every 2 years. The testing frequency can be increased to one year for patients who have rapidly progressing disease, those who are receiving or discontinuing medical therapy to restore bone mass, or have additional risk factors.  World Pharmacologist [facility]) Criteria:  Normal: T-scores from +1.0 to -1.0  Low Bone Mass (Osteopenia): T-scores between -1.0 and -2.5  Osteoporosis: T-scores -2.5 and below  Comparison to Reference Population:  T-score is the key measure used in the diagnosis of osteoporosis and relative risk determination for fracture. It provides a value for bone  mass relative to the mean bone mass of a young adult reference population expressed in terms of standard deviation (SD).  Z-score is the age-matched score showing the patient's values compared to a population matched for age, sex, and race. This is also expressed in terms of standard deviation. The patient may have values that compare favorably to the age-matched values and still be at increased risk for fracture.   Electronically Signed   By: Nolon Nations M.D.   On: 09/27/2014 17:16   Mm Digital Diagnostic Unilat L  10/05/2014   CLINICAL DATA:  Status post ultrasound-guided core biopsy of left breast mass.  EXAM: DIAGNOSTIC LEFT MAMMOGRAM POST ULTRASOUND BIOPSY  COMPARISON:  Previous exam(s).  FINDINGS: Mammographic images were obtained following ultrasound guided biopsy of mass in the 2:30 o'clock location of the left breast. A coil shaped clip is identified in the upper outer quadrant of the left breast as expected.  IMPRESSION: Tissue marker clip is in expected location following biopsy.  Final Assessment: Post Procedure Mammograms for Marker Placement   Electronically Signed   By: Nolon Nations M.D.   On: 10/05/2014 16:31   US Breast Ltd Uni Left Inc Axilla  09/27/2014   CLINICAL DATA:  80 year old with palpable mass in the left breast.  EXAM: DIGITAL DIAGNOSTIC BILATERAL MAMMOGRAM WITH 3D TOMOSYNTHESIS WITH CAD  ULTRASOUND LEFT BREAST  COMPARISON:  With prior  ACR Breast Density Category b: There are scattered areas of fibroglandular density.  FINDINGS: There is a new irregularly-shaped mass in the outer left breast that corresponds to the palpable area of concern. No suspicious microcalcification or distortion is identified in the left breast.  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 There are no suspicious findings on the right.  Mammographic images were processed with CAD.  On physical exam, there is a firm palpable mass in the 3:30 position of the left  breast approximately 2 cm from the nipple.  Targeted ultrasound is performed, showing an irregular hypoechoic mass with prominent internal vascular flow at 3:30 position 2 cm from the nipple that measures 2.0 x 1.9 x 1.2 cm. Ultrasound of the left  axilla demonstrates no enlarged lymph nodes. There is an equivocal 4 mm lymph node without a discrete fatty hilum.  IMPRESSION: Suspicious 2.0 cm mass 3:30 position left breast.  RECOMMENDATION: Ultrasound-guided core needle biopsy of the left breast is recommended and is being scheduled for the patient.  I have discussed the findings and recommendations with the patient. Results were also provided in writing at the conclusion of the visit. If applicable, a reminder letter will be sent to the patient regarding the next appointment.  BI-RADS CATEGORY  5: Highly suggestive of malignancy.   Electronically Signed   By: Curlene Dolphin M.D.   On: 09/27/2014 17:18   Mm Diag Breast Tomo Bilateral  09/27/2014   CLINICAL DATA:  80 year old with palpable mass in the left breast.  EXAM: DIGITAL DIAGNOSTIC BILATERAL MAMMOGRAM WITH 3D TOMOSYNTHESIS WITH CAD  ULTRASOUND LEFT BREAST  COMPARISON:  With prior  ACR Breast Density Category b: There are scattered areas of fibroglandular density.  FINDINGS: There is a new irregularly-shaped mass in the outer left breast that corresponds to the palpable area of concern. No suspicious microcalcification or distortion is identified in the left breast.  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 There are no suspicious findings on the right.  Mammographic images were processed with CAD.  On physical exam, there is a firm palpable mass in the 3:30 position of the left breast approximately 2 cm from the nipple.  Targeted ultrasound is performed, showing an irregular hypoechoic mass with prominent internal vascular flow at 3:30 position 2 cm from the nipple that measures 2.0 x 1.9 x 1.2 cm. Ultrasound of the left  axilla demonstrates no enlarged lymph nodes. There is an equivocal 4 mm lymph node without a discrete fatty hilum.  IMPRESSION: Suspicious 2.0 cm mass 3:30 position left breast.  RECOMMENDATION: Ultrasound-guided core needle biopsy of the left breast is recommended and is being scheduled for the patient.  I have discussed the findings and recommendations with the patient. Results were also provided in writing at the conclusion of the visit. If applicable, a reminder letter will be sent to the patient regarding the next appointment.  BI-RADS CATEGORY  5: Highly suggestive of malignancy.   Electronically Signed   By: Curlene Dolphin M.D.   On: 09/27/2014 17:18   [place] Lt Breast Bx W Loc Dev 1st Lesion Img Bx Spec US Guide  10/07/2014   ADDENDUM REPORT: 10/06/2014 14:48  ADDENDUM: Pathology reveals Left Breast invasive mammary carcinoma with extravasated mucin and ductal carcinoma in situ. This was found to be concordant by Dr. Nolon Nations. Pathology results were discussed with the patient via telephone. The patient reported tenderness at the biopsy site. Post biopsy instructions were reviewed and questions were answered. The patient was encouraged to call [facility] with any additional questions and or concerns. The patient was referred to the [facility] on Oct 13, 2014.  Pathology results reported by Terie Purser RN on Oct 06, 2014.   Electronically Signed   By: Nolon Nations M.D.   On: 10/06/2014 14:48   10/07/2014   CLINICAL DATA:  Patient presents for ultrasound-guided core biopsy of left breast mass.  EXAM: ULTRASOUND GUIDED LEFT BREAST CORE NEEDLE BIOPSY  COMPARISON:  09/27/2014  PROCEDURE: I met with the patient and we discussed the procedure of ultrasound-guided biopsy, including benefits and alternatives. We discussed the high likelihood of a successful procedure. We discussed the risks of the procedure  including infection, bleeding, tissue  injury, clip migration, and inadequate sampling. Informed written consent was given. The usual time-out protocol was performed immediately prior to the procedure.  Using sterile technique and 2% Lidocaine as local anesthetic, under direct ultrasound visualization, a 12 gauge vacuum-assisted device was used to perform biopsy of mass in the 2:30 o'clock location of the left breastusing a lateral approach. At the conclusion of the procedure, a coil shaped tissue marker clip was deployed into the biopsy cavity. Follow-up 2-view mammogram was performed and dictated separately.  IMPRESSION: Ultrasound-guided biopsy of left breast mass. No apparent complications.  Electronically Signed: By: Nolon Nations M.D. On: 10/05/2014 16:31    ASSESSMENT: 80 y.o.  [place] woman status post left breast biopsy 10/05/2014 for a clinical T1 N0, stage IA invasive ductal breast cancer, grade 1, estrogen and progesterone receptor positive, HER-2 not amplified, with an MIB-1 of 5%   (1) breast conserving surgery pending  (2) radiation to follow definitive surgery  (3) anti-estrogens to follow radiation  PLAN: We spent the better part of today's 50 minute-long appointment discussing the biology of breast cancer in general, and the specifics of the patient's tumor in particular. This weaning understands she has a slow growing tumor on the borderline between stages 1 and 2 of breast cancer. The cancer does not appear aggressive on microscopy. For those reasons and because of her age, I do not believe she would benefit enough from chemotherapy to warrant that treatment  On the other hand she will clearly benefit from breast conserving surgery. Radiation can be omitted in women over 64 who have estrogen receptor positive tumors that are node-negative and not high-grade and who taken antiestrogen for 5 years. The decision whether or not to proceed with adjuvant radiation in this setting has a lot to do with the patient's  overall health, which appears to be quite good in Mrs. Flahive's case. At this point it appears likely that she would receive radiation treatments.  Accordingly I will see her again in approximately 2 months. By then she should be done with her postoperative radiation and we can discuss anti-estrogens. Today I explained that the choice chiefly will be between tamoxifen and anastrozole and that I will probably recommend anastrozole to her when we do meet next time.  She has some questions regarding Essiac tea and some other supplements. I do not see any obvious contraindication in these agents. Of course these are not considered "medicines" so we do not have a clear sense of possible interactions. Nevertheless alone as the ages do not include estrogen I am not uncomfortable with her proceeding with these at her own discretion.  The patient has a good understanding of the overall plan. She agrees with it. She knows the goal of treatment in her case is cure. She will call with any problems that may develop before her next visit here.  Chauncey Cruel, MD   10/13/2014 3:55 PM Medical Oncology and Hematology [facility] [address] [place], [place] [address] Tel. [phone]    Fax. [phone]

## 2014-10-13 NOTE — Progress Notes (Signed)
[name] is a very pleasant 80 y.o. female from [place], [place] with newly diagnosed grade 1-2 invasive ductal carcinoma & DCIS of the left breast.  Biopsy results revealed the tumor's prognostic profile is ER positive, PR positive, and HER2/neu negative. Ki67 is 5%.  She presents today with her son to the [facility]) for treatment consideration and recommendations from the breast surgeon, radiation oncologist, and medical oncologist.     I briefly met with [name] and her son during her [facility] visit today. We discussed the purpose of the Survivorship Clinic, which will include monitoring for recurrence, coordinating completion of age and gender-appropriate cancer screenings, promotion of overall wellness, as well as managing potential late/long-term side effects of anti-cancer treatments.    The treatment plan for [name] will likely include surgery, radiation therapy, and anti-estrogen therapy.  As of today, the intent of treatment for [name] is cure, therefore she will be eligible for the Survivorship Clinic upon her completion of treatment.  Her survivorship care plan (SCP) document will be drafted and updated throughout the course of her treatment trajectory. She will receive the SCP in an office visit with myself in the Survivorship Clinic once she has completed treatment.   [name] was encouraged to ask questions and all questions were answered to her satisfaction.  She was given my business card and encouraged to contact me with any concerns regarding survivorship.  I look forward to participating in her care.   Mike Craze, NP [place] [phone]

## 2014-10-13 NOTE — Therapy (Signed)
[place], [place], [address] Phone: [phone]   Fax:  [phone]  Physical Therapy Evaluation  Patient Details  Name: [name] MRN: [id number] Date of Birth: 04/16/1935 Referring Provider:  Jovita Kussmaul, MD  Encounter Date: 10/13/2014      PT End of Session - 10/13/14 1555    Visit Number 1   Number of Visits 1   PT Start Time 1320   PT Stop Time 1344   PT Time Calculation (min) 24 min   Activity Tolerance Patient tolerated treatment well   Behavior During Therapy [facility] for tasks assessed/performed      Past Medical History  Diagnosis Date  . Hypertension   . Anxiety   . PUD (peptic ulcer disease)   . GERD (gastroesophageal reflux disease)   . Breast cancer of lower-outer quadrant of left female breast 10/11/2014  . Breast cancer     History reviewed. No pertinent past surgical history.  There were no vitals filed for this visit.  Visit Diagnosis:  Breast cancer, left breast - Plan: PT plan of care cert/re-cert  Postural kyphosis of thoracic region - Plan: PT plan of care cert/re-cert      Subjective Assessment - 10/13/14 1547    Subjective Patient was seen today for a baseline assessment related to her newly diagnosed left breast cancer.   Patient is accompained by: Family member   Pertinent History Patient was diagnosed on 10/05/14 with left ER/PR positive, HER2 negative breast cancer.  It has a Ki67 of 5% and measures 2 cm in size on ultrasound.  It is invasive ductal carcinoma and is Grade 1-2.   Patient Stated Goals Learn post op shoulder ROM HEP and lymphedema risk reduction   Currently in Pain? No/denies            [facility] PT Assessment - 10/13/14 0001    Assessment   Medical Diagnosis Left breast cancer   Onset Date 10/05/14   Precautions   Precautions Other (comment)  Active breast cancer   Restrictions   Weight Bearing Restrictions No   Balance Screen   Has  the patient fallen in the past 6 months No   Has the patient had a decrease in activity level because of a fear of falling?  No   Is the patient reluctant to leave their home because of a fear of falling?  No   Home Environment   Living Enviornment Private residence   Living Arrangements Spouse/significant other;Children  Husband and adult brother   Available Help at Discharge Family   Prior Function   Level of Independence Independent with basic ADLs   Vocation Retired   Leisure She walks 30 minutes once a week and does gardening and housework.   Cognition   Overall Cognitive Status Within Functional Limits for tasks assessed   Posture/Postural Control   Posture/Postural Control Postural limitations   Postural Limitations Rounded Shoulders;Forward head;Increased thoracic kyphosis   ROM / Strength   AROM / PROM / Strength AROM;Strength   AROM   AROM Assessment Site Shoulder   Right/Left Shoulder Right;Left   Right Shoulder Extension 55 Degrees   Right Shoulder Flexion 125 Degrees   Right Shoulder ABduction 151 Degrees   Right Shoulder Internal Rotation 50 Degrees   Right Shoulder External Rotation 88 Degrees   Left Shoulder Extension 59 Degrees   Left Shoulder Flexion 124 Degrees   Left Shoulder ABduction 125 Degrees   Left Shoulder Internal Rotation  75 Degrees   Left Shoulder External Rotation 83 Degrees   Strength   Overall Strength Within functional limits for tasks performed           LYMPHEDEMA/ONCOLOGY QUESTIONNAIRE - 10/13/14 1553    Type   Cancer Type Left breast   Lymphedema Assessments   Lymphedema Assessments Upper extremities   Right Upper Extremity Lymphedema   10 cm Proximal to Olecranon Process 28.6 cm   Olecranon Process 26.6 cm   10 cm Proximal to Ulnar Styloid Process 21.5 cm   Just Proximal to Ulnar Styloid Process 15.7 cm   Across Hand at [employer] 20.8 cm   At [place] of 2nd Digit 6 cm   Left Upper Extremity Lymphedema   10 cm Proximal to  Olecranon Process 28.5 cm   Olecranon Process 25.7 cm   10 cm Proximal to Ulnar Styloid Process 19.8 cm   Just Proximal to Ulnar Styloid Process 15.1 cm   Across Hand at [employer] 20.3 cm   At [place] of 2nd Digit 5.9 cm       Patient was instructed today in a home exercise program today for post op shoulder range of motion. These included active assist shoulder flexion in sitting, scapular retraction, wall walking with shoulder abduction, and hands behind head external rotation.  She was encouraged to do these twice a day, holding 3 seconds and repeating 5 times when permitted by her physician.         PT Education - 10/13/14 1555    Education provided Yes   Education Details Post op shoulder ROM HEP and lymphedema risk reduction   Person(s) Educated Patient;Spouse;Child(ren)   Methods Explanation;Demonstration;Handout   Comprehension Verbalized understanding;Returned demonstration              Breast Clinic Goals - 10/13/14 1601    Patient will be able to verbalize understanding of pertinent lymphedema risk reduction practices relevant to her diagnosis specifically related to skin care.   Time 1   Period Days   Status Achieved   Patient will be able to return demonstrate and/or verbalize understanding of the post-op home exercise program related to regaining shoulder range of motion.   Time 1   Period Days   Status Achieved   Patient will be able to verbalize understanding of the importance of attending the postoperative After Breast Cancer Class for further lymphedema risk reduction education and therapeutic exercise.   Time 1   Period Days   Status Achieved              Plan - 10/13/14 1556    Clinical Impression Statement Patient was diagnosed on 10/05/14 with left ER/PR positive, HER2 negative breast cancer.  It has a Ki67 of 5% and measures 2 cm in size on ultrasound.  It is invasive ductal carcinoma and is Grade 1-2.  She is planning to have a left  lumpectomy with a sentinel node biopsy followed by radiation and anti-estrogen therapy.  She will likely benefit from post op PT to regain shoulder ROM and strength and reduce lymphedema risk.   Pt will benefit from skilled therapeutic intervention in order to improve on the following deficits Decreased range of motion;Increased edema;Pain;Impaired UE functional use;Decreased strength;Decreased knowledge of precautions   Rehab Potential Excellent   Clinical Impairments Affecting Rehab Potential none   PT Frequency One time visit   PT Treatment/Interventions Patient/family education;Therapeutic exercise   Consulted and Agree with Plan of Care Patient;Family member/caregiver  Family Member Consulted Husband and son     Patient will follow up at outpatient cancer rehab if needed following surgery.  If the patient requires physical therapy at that time, a specific plan will be dictated and sent to the referring physician for approval. The patient was educated today on appropriate basic range of motion exercises to begin post operatively and the importance of attending the After Breast Cancer class following surgery.  Patient was educated today on lymphedema risk reduction practices as it pertains to recommendations that will benefit the patient immediately following surgery.  She verbalized good understanding.  No additional physical therapy is indicated at this time.         G-Codes - 08-Nov-2014 1601    Functional Assessment Tool Used Clinical Judgement   Functional Limitation Self care   Self Care Current Status [phone]) At least 1 percent but less than 20 percent impaired, limited or restricted   Self Care Goal Status (H4035) At least 1 percent but less than 20 percent impaired, limited or restricted   Self Care Discharge Status [phone]) At least 1 percent but less than 20 percent impaired, limited or restricted       Problem List Patient Active Problem List   Diagnosis Date Noted  . Breast  cancer of lower-outer quadrant of left female breast 10/11/2014  . Pure hypercholesterolemia 04/08/2014  . Breast asymmetry 09/06/2013  . Anxiety   . PUD (peptic ulcer disease)   . HTN (hypertension), benign 06/29/2011  . GERD (gastroesophageal reflux disease) 06/29/2011    Annia Friendly, PT 08-Nov-2014, 4:05 PM  [place], [place], [address] Phone: [phone]   Fax:  [phone]

## 2014-10-13 NOTE — Progress Notes (Signed)
Radiation Oncology         [phone] ________________________________  Initial outpatient Consultation  Name: [name] MRN: [id number]  Date: 10/13/2014  DOB: 04/19/1935  OJ:[id number],XHBZJIR, MD  Jovita Kussmaul, MD   REFERRING PHYSICIAN: Jovita Kussmaul, MD  DIAGNOSIS:    ICD-9-CM ICD-10-CM   1. Breast cancer of lower-outer quadrant of left female breast 174.5 C50.512    Stage I, T1c N0 M0, left breast Grade 1-2 invasive mammary carcinoma with extravasated mucin, ER PR positive, HER-2 negative, there was also DCIS in the specimen   HISTORY OF PRESENT ILLNESS::[name] is a 80 y.o. female who presented with a palpable left breast mass. Mammography and ultrasound were performed this was in the 3:30 o'clock position, measuring 2.0 cm. No MRI has been done. Biopsy revealed a Grade 1-2 invasive mammary carcinoma with extravasated mucin. ER PR positive, HER-2 negative, there was also DCIS in the specimen. She was on hormone replacement therapy for 3 years and at age 68. The hormone replacement therapy was given to help with menopause.  She went to have a physical and her doctor recommended she have a mammogram, this is where the cancer was first discovered. Her primary doctor is Dr. Ellsworth Lennox. She reports that she has never had cancer before. She lives in [place]. Her previous mammogram, before this past one, was about 10 years ago. Found she had high blood pressure. She denies headaches or blurry vision. Says she is experiencing some stress currently from taking care of her grandchildren.   PREVIOUS RADIATION THERAPY: No  PAST MEDICAL HISTORY:  has a past medical history of Hypertension; Anxiety; PUD (peptic ulcer disease); GERD (gastroesophageal reflux disease); Breast cancer of lower-outer quadrant of left female breast (10/11/2014); and Breast cancer.    PAST SURGICAL HISTORY:No past surgical history on file.  FAMILY HISTORY: family history includes Cancer in  her mother; Heart disease in her brother; Hypertension in her brother, brother, sister, and sister; Stroke in her father.  SOCIAL HISTORY:  reports that she has never smoked. She does not have any smokeless tobacco history on file. She reports that she does not drink alcohol or use illicit drugs.  ALLERGIES: Review of patient's allergies indicates no known allergies.  MEDICATIONS:  Current Outpatient Prescriptions  Medication Sig Dispense Refill  . Ascorbic Acid (VITAMIN C) 1000 MG tablet Take 1,000 mg by mouth daily.    Marland Kitchen aspirin 81 MG tablet Take 81 mg by mouth daily.    . fish oil-omega-3 fatty acids 1000 MG capsule Take 1 g by mouth daily.    Marland Kitchen GARLIC PO Take by mouth daily.    Marland Kitchen losartan (COZAAR) 25 MG tablet Take 1 tablet (25 mg total) by mouth daily. 30 tablet 3  . Multiple Vitamin (MULTIVITAMIN) tablet Take 1 tablet by mouth daily.     No current facility-administered medications for this encounter.    REVIEW OF SYSTEMS:  Notable for that above.   PHYSICAL EXAM:   Vitals - 1 value per visit 6/78/9381  SYSTOLIC 017  DIASTOLIC 71  Pulse 72  Temperature 97.8  Respirations 18  Weight (lb) 182.2  Height _0   BMI 27.71  VISIT REPORT    General: Alert and oriented, in no acute distress HEENT: Head is normocephalic. Extraocular movements are intact. Oropharynx is clear. Neck: Neck is supple, no palpable cervical or supraclavicular lymphadenopathy. Heart: Regular in rate and rhythm with no murmurs, rubs, or gallops. //High blood pressure. Chest: Clear to auscultation  bilaterally, with no rhonchi, wheezes, or rales. Abdomen: Soft, nontender, nondistended, with no rigidity or guarding. Extremities: No cyanosis or edema. Lymphatics: see Neck Exam Skin: No concerning lesions. Musculoskeletal: symmetric strength and muscle tone throughout. Neurologic: Cranial nerves II through XII are grossly intact. No obvious focalities. Speech is fluent. Coordination is intact. Psychiatric:  Judgment and insight are intact. Affect is appropriate. Breast: Nothing of concern on the right, no palpable lumps or axillary nodes. Left breast is smaller than the right. No palpable left axillary lymph nodes. Left breast lower outer quadrant bruising with post biopsy firmness around the 3 o'clock position. Post biopsy firmness is 3-4 cm in circumference.    ECOG = 0  0 - Asymptomatic (Fully active, able to carry on all predisease activities without restriction)  1 - Symptomatic but completely ambulatory (Restricted in physically strenuous activity but ambulatory and able to carry out work of a light or sedentary nature. For example, light housework, office work)  2 - Symptomatic, <50% in bed during the day (Ambulatory and capable of all self care but unable to carry out any work activities. Up and about more than 50% of waking hours)  3 - Symptomatic, >50% in bed, but not bedbound (Capable of only limited self-care, confined to bed or chair 50% or more of waking hours)  4 - Bedbound (Completely disabled. Cannot carry on any self-care. Totally confined to bed or chair)  5 - Death   Eustace Pen MM, Creech RH, Tormey DC, et al. [phone]). "Toxicity and response criteria of the [facility]". [place] Oncol. 5 (6): 649-55   LABORATORY DATA:  Lab Results  Component Value Date   WBC 5.4 10/13/2014   HGB 12.0 10/13/2014   HCT 37.0 10/13/2014   MCV 91.4 10/13/2014   PLT 218 10/13/2014   CMP     Component Value Date/Time   NA 144 10/13/2014 1231   NA 140 09/09/2014 1150   K 3.9 10/13/2014 1231   K 4.2 09/09/2014 1150   CL 105 09/09/2014 1150   CO2 24 10/13/2014 1231   CO2 25 09/09/2014 1150   GLUCOSE 111 10/13/2014 1231   GLUCOSE 98 09/09/2014 1150   BUN 21.8 10/13/2014 1231   BUN 15 09/09/2014 1150   CREATININE 1.2* 10/13/2014 1231   CREATININE 0.89 09/09/2014 1150   CREATININE 0.78 02/02/2011 0700   CALCIUM 10.6* 10/13/2014 1231   CALCIUM 10.5 09/09/2014  1150   PROT 7.1 10/13/2014 1231   PROT 7.3 09/09/2014 1150   ALBUMIN 3.8 10/13/2014 1231   ALBUMIN 4.3 09/09/2014 1150   AST 19 10/13/2014 1231   AST 18 09/09/2014 1150   ALT 15 10/13/2014 1231   ALT 18 09/09/2014 1150   ALKPHOS 64 10/13/2014 1231   ALKPHOS 55 09/09/2014 1150   BILITOT 0.49 10/13/2014 1231   BILITOT 0.8 09/09/2014 1150   GFRNONAA 63 09/09/2014 1150   GFRNONAA >60 02/02/2011 0700   GFRAA 72 09/09/2014 1150   GFRAA >60 02/02/2011 0700         RADIOGRAPHY: Dg Bone Density  09/27/2014   CLINICAL DATA:  Postmenopausal. Baseline exam. The patient takes vitamin-D and calcium supplements. History of estrogen replacement therapy in the past.  EXAM: DUAL X-RAY ABSORPTIOMETRY (DXA) FOR BONE MINERAL DENSITY  FINDINGS: AP LUMBAR SPINE L3, L4  Bone Mineral Density (BMD):  0.541 g/cm2  Young Adult T-Score:  -5.1  Z-Score:  -3.1  Left FEMUR neck  Bone Mineral Density (BMD):  0.569 g/cm2  Annamaria Boots  Adult T-Score: -2.5  Z-Score:  -1.0  ASSESSMENT: Patient's diagnostic category is osteoporosis by WHO Criteria.  FRACTURE RISK: Increased  FRAX: World Health Organization FRAX assessment of absolute fracture risk is not calculated for this patient because the patient has T-scores below -2.5.  COMPARISON: None  Effective therapies are available in the form of bisphosphonates, selective estrogen receptor modulators, biologic agents, and hormone replacement therapy (for women). All patients should ensure an adequate intake of dietary calcium (1200 mg daily) and vitamin D (800 IU daily) unless contraindicated.  All treatment decisions require clinical judgment and consideration of individual patient factors, including patient preferences, co-morbidities, previous drug use, risk factors not captured in the FRAX model (e.g., frailty, falls, vitamin D deficiency, increased bone turnover, interval significant decline in bone density) and possible under- or over-estimation of fracture risk by FRAX.  The National  Osteoporosis Foundation recommends that FDA-approved medical therapies be considered in postmenopausal women and men age 62 or older with a:  1. Hip or vertebral (clinical or morphometric) fracture.  2. T-score of -2.5 or lower at the spine or hip.  3. Ten-year fracture probability by FRAX of 3% or greater for hip fracture or 20% or greater for major osteoporotic fracture.  People with diagnosed cases of osteoporosis or at high risk for fracture should have regular bone mineral density tests. For patients eligible for Medicare, routine testing is allowed once every 2 years. The testing frequency can be increased to one year for patients who have rapidly progressing disease, those who are receiving or discontinuing medical therapy to restore bone mass, or have additional risk factors.  World Pharmacologist [facility]) Criteria:  Normal: T-scores from +1.0 to -1.0  Low Bone Mass (Osteopenia): T-scores between -1.0 and -2.5  Osteoporosis: T-scores -2.5 and below  Comparison to Reference Population:  T-score is the key measure used in the diagnosis of osteoporosis and relative risk determination for fracture. It provides a value for bone mass relative to the mean bone mass of a young adult reference population expressed in terms of standard deviation (SD).  Z-score is the age-matched score showing the patient's values compared to a population matched for age, sex, and race. This is also expressed in terms of standard deviation. The patient may have values that compare favorably to the age-matched values and still be at increased risk for fracture.   Electronically Signed   By: Nolon Nations M.D.   On: 09/27/2014 17:16   Mm Digital Diagnostic Unilat L  10/05/2014   CLINICAL DATA:  Status post ultrasound-guided core biopsy of left breast mass.  EXAM: DIAGNOSTIC LEFT MAMMOGRAM POST ULTRASOUND BIOPSY  COMPARISON:  Previous exam(s).  FINDINGS: Mammographic images were obtained following ultrasound guided biopsy of mass  in the 2:30 o'clock location of the left breast. A coil shaped clip is identified in the upper outer quadrant of the left breast as expected.  IMPRESSION: Tissue marker clip is in expected location following biopsy.  Final Assessment: Post Procedure Mammograms for Marker Placement   Electronically Signed   By: Nolon Nations M.D.   On: 10/05/2014 16:31   US Breast Ltd Uni Left Inc Axilla  09/27/2014   CLINICAL DATA:  80 year old with palpable mass in the left breast.  EXAM: DIGITAL DIAGNOSTIC BILATERAL MAMMOGRAM WITH 3D TOMOSYNTHESIS WITH CAD  ULTRASOUND LEFT BREAST  COMPARISON:  With prior  ACR Breast Density Category b: There are scattered areas of fibroglandular density.  FINDINGS: There is a new irregularly-shaped mass in the outer left  breast that corresponds to the palpable area of concern. No suspicious microcalcification or distortion is identified in the left breast.  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 There are no suspicious findings on the right.  Mammographic images were processed with CAD.  On physical exam, there is a firm palpable mass in the 3:30 position of the left breast approximately 2 cm from the nipple.  Targeted ultrasound is performed, showing an irregular hypoechoic mass with prominent internal vascular flow at 3:30 position 2 cm from the nipple that measures 2.0 x 1.9 x 1.2 cm. Ultrasound of the left axilla demonstrates no enlarged lymph nodes. There is an equivocal 4 mm lymph node without a discrete fatty hilum.  IMPRESSION: Suspicious 2.0 cm mass 3:30 position left breast.  RECOMMENDATION: Ultrasound-guided core needle biopsy of the left breast is recommended and is being scheduled for the patient.  I have discussed the findings and recommendations with the patient. Results were also provided in writing at the conclusion of the visit. If applicable, a reminder letter will be sent to the patient regarding the next appointment.  BI-RADS  CATEGORY  5: Highly suggestive of malignancy.   Electronically Signed   By: Curlene Dolphin M.D.   On: 09/27/2014 17:18   Mm Diag Breast Tomo Bilateral  09/27/2014   CLINICAL DATA:  80 year old with palpable mass in the left breast.  EXAM: DIGITAL DIAGNOSTIC BILATERAL MAMMOGRAM WITH 3D TOMOSYNTHESIS WITH CAD  ULTRASOUND LEFT BREAST  COMPARISON:  With prior  ACR Breast Density Category b: There are scattered areas of fibroglandular density.  FINDINGS: There is a new irregularly-shaped mass in the outer left breast that corresponds to the palpable area of concern. No suspicious microcalcification or distortion is identified in the left breast.  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 There are no suspicious findings on the right.  Mammographic images were processed with CAD.  On physical exam, there is a firm palpable mass in the 3:30 position of the left breast approximately 2 cm from the nipple.  Targeted ultrasound is performed, showing an irregular hypoechoic mass with prominent internal vascular flow at 3:30 position 2 cm from the nipple that measures 2.0 x 1.9 x 1.2 cm. Ultrasound of the left axilla demonstrates no enlarged lymph nodes. There is an equivocal 4 mm lymph node without a discrete fatty hilum.  IMPRESSION: Suspicious 2.0 cm mass 3:30 position left breast.  RECOMMENDATION: Ultrasound-guided core needle biopsy of the left breast is recommended and is being scheduled for the patient.  I have discussed the findings and recommendations with the patient. Results were also provided in writing at the conclusion of the visit. If applicable, a reminder letter will be sent to the patient regarding the next appointment.  BI-RADS CATEGORY  5: Highly suggestive of malignancy.   Electronically Signed   By: Curlene Dolphin M.D.   On: 09/27/2014 17:18   [place] Lt Breast Bx W Loc Dev 1st Lesion Img Bx Spec US Guide  10/07/2014   ADDENDUM REPORT: 10/06/2014 14:48  ADDENDUM: Pathology  reveals Left Breast invasive mammary carcinoma with extravasated mucin and ductal carcinoma in situ. This was found to be concordant by Dr. Nolon Nations. Pathology results were discussed with the patient via telephone. The patient reported tenderness at the biopsy site. Post biopsy instructions were reviewed and questions were answered. The patient was encouraged to call [facility] with any additional questions and or concerns. The patient was  referred to the [facility] on Oct 13, 2014.  Pathology results reported by Terie Purser RN on Oct 06, 2014.   Electronically Signed   By: Nolon Nations M.D.   On: 10/06/2014 14:48   10/07/2014   CLINICAL DATA:  Patient presents for ultrasound-guided core biopsy of left breast mass.  EXAM: ULTRASOUND GUIDED LEFT BREAST CORE NEEDLE BIOPSY  COMPARISON:  09/27/2014  PROCEDURE: I met with the patient and we discussed the procedure of ultrasound-guided biopsy, including benefits and alternatives. We discussed the high likelihood of a successful procedure. We discussed the risks of the procedure including infection, bleeding, tissue injury, clip migration, and inadequate sampling. Informed written consent was given. The usual time-out protocol was performed immediately prior to the procedure.  Using sterile technique and 2% Lidocaine as local anesthetic, under direct ultrasound visualization, a 12 gauge vacuum-assisted device was used to perform biopsy of mass in the 2:30 o'clock location of the left breastusing a lateral approach. At the conclusion of the procedure, a coil shaped tissue marker clip was deployed into the biopsy cavity. Follow-up 2-view mammogram was performed and dictated separately.  IMPRESSION: Ultrasound-guided biopsy of left breast mass. No apparent complications.  Electronically Signed: By: Nolon Nations M.D. On: 10/05/2014 16:31      IMPRESSION/PLAN:  She has been discussed at our  multidisciplinary tumor board.  The consensus is that she would be a good candidate for breast conservation. I talked to her about the option of a mastectomy and informed her that her expected overall survival would be equivalent between mastectomy and breast conservation, based upon randomized controlled data. She is enthusiastic about breast conservation.  Multidisciplinary plan is for lumpectomy and sentinel lymph node biopsy. Medical oncology recommends hormonal therapy, probably not chemotherapy.  Radiation therapy would probably take 4 weeks rather than 6, by her chest measurement.   Advised to stay hydrated and keep her weight stable through treatment.   Patient appears to have a borderline stage I/II tumor and is in good health for her age. For these reasons, she will likely benefit from RT for local control. It was a pleasure meeting the patient today. We discussed the risks, benefits, and side effects of radiotherapy. We discussed that radiation would take approximately 4 weeks to complete and that I would give the patient a few weeks to heal following surgery before starting treatment planning. We spoke about acute effects including skin irritation and fatigue as well as much less common late effects including lung and heart irritation. We spoke about the latest technology that is used to minimize the risk of late effects for breast cancer patients undergoing radiotherapy. No guarantees of treatment were given. The patient is enthusiastic about proceeding with treatment. I look forward to participating in the patient's care.     This document serves as a record of services personally performed by Eppie Gibson, MD. It was created on her behalf by Arlyce Harman, a trained medical scribe. The creation of this record is based on the scribe's personal observations and the provider's statements to them. This document has been checked and approved by the attending  provider.  __________________________________________   Eppie Gibson, MD

## 2014-10-13 NOTE — Patient Instructions (Signed)

## 2014-10-13 NOTE — Progress Notes (Signed)
Checked in new pt with no financial concerns prior to seeing the dr.  Informed pt if chemo is part of her treatment we will call her ins to see if Josem Kaufmann is required and will obtain it if it is as well as contact foundations that offer copay assistance for chemo if needed.  She has my card for any billing questions or concerns.

## 2014-10-13 NOTE — Telephone Encounter (Signed)
Left message to confirm appointment for July. Mailed calendar.

## 2014-10-15 ENCOUNTER — Encounter: Payer: Self-pay | Admitting: General Practice

## 2014-10-15 NOTE — Progress Notes (Signed)
[facility] Psychosocial Distress Screening Clinical Social Work  Met with [name] and her son [name] at [facility] to introduce [facility] team/resources, reviewing distress screen per protocol.  The patient scored a 8 on the Psychosocial Distress Thermometer which indicates severe distress. Also assessed for distress and other psychosocial needs.   ONCBCN DISTRESS SCREENING 10/15/2014  Screening Type Initial Screening  Distress experienced in past week (1-10) 8  Practical problem type Housing;Work/school;Transportation;Food;Childcare  Family Problem type Other (comment)  Referral to support programs Yes  Other [name] clarified that her "practical problems" list is short-term:  She is staying with and caring for her grandkids (12, 10, 6) while her daughter and son-in-law are out of town.  She describes herself as a private person with strong faith and prayer practice, both of which are significant coping tools and sources of meaning.  We discussed how being able to rest after such a busy schedule and to share/debrief dx/tx details with her daughter will help reduce her distress.    Follow up needed: No.  Pt has Support Center team/resource info packet, including contact info, and is aware of ongoing chaplain availability for support.  Please also page as needs arise.  Thank you.  [place], [place]

## 2014-10-18 ENCOUNTER — Telehealth: Payer: Self-pay | Admitting: *Deleted

## 2014-10-18 NOTE — Telephone Encounter (Signed)
Spoke to pt concerning [name] from 10/13/14. Pt denies questions or concerns at this time. Confirmed surgery date and f/u appt with Dr. [name]. Informed pt she will be scheduled to see Dr. Isidore Moos 2 wks after surgery. Encourage pt to call with needs. Received verbal understanding. Referral placed for Dr. Isidore Moos.

## 2014-10-20 ENCOUNTER — Encounter: Payer: Self-pay | Admitting: Family Medicine

## 2014-10-20 ENCOUNTER — Encounter (HOSPITAL_BASED_OUTPATIENT_CLINIC_OR_DEPARTMENT_OTHER): Payer: Self-pay | Admitting: *Deleted

## 2014-10-20 ENCOUNTER — Ambulatory Visit (INDEPENDENT_AMBULATORY_CARE_PROVIDER_SITE_OTHER): Payer: Medicare Other | Admitting: Family Medicine

## 2014-10-20 VITALS — BP 174/82 | HR 72 | Temp 98.1°F | Resp 16 | Ht 68.0 in | Wt 185.2 lb

## 2014-10-20 DIAGNOSIS — M810 Age-related osteoporosis without current pathological fracture: Secondary | ICD-10-CM | POA: Insufficient documentation

## 2014-10-20 DIAGNOSIS — I10 Essential (primary) hypertension: Secondary | ICD-10-CM

## 2014-10-20 HISTORY — DX: Age-related osteoporosis without current pathological fracture: M81.0

## 2014-10-20 MED ORDER — LOSARTAN POTASSIUM 50 MG PO TABS: 50.0000 mg | ORAL_TABLET | Freq: Every day | ORAL | Status: DC

## 2014-10-20 NOTE — Patient Instructions (Signed)
Osteoporosis Osteoporosis happens when your bones become weak because of bone loss. Weak bones can break (fracture) more easily with slips or falls. You are more likely to develop osteoporosis if:  You are a woman.  You are older than 50 years.  You are white or Asian.  You are very thin.  Someone in your family has had osteoporosis.  You smoke or use nicotine. CAUSES   Smoking.  Too much drinking.  Being a weight below normal.  Not   being active.  Not going outside in the sun enough.  Certain medical conditions, such as diabetes or Crohn disease.  Certain medicines, such as steroids or antiseizure medicines. TREATMENT  The goal of treatment is to strengthen bones. There are different types of medicines that help your bones. Some medicines make your bones more solid. Some medicines help to slow down how much bone you lose. Your doctor may check to see if you are getting enough calcium and vitamin D in your diet. PREVENTION   Make sure you get enough calcium and vitamin D.  Make sure you exercise often.  If you smoke, quit. MAKE SURE YOU:  Understand these instructions.  Will watch your condition.  Will get help right away if you are not doing well or get worse. Document Released: 08/13/2011 Document Reviewed: 08/13/2011 [facility] Patient Information 2015 [place]. This information is not intended to replace advice given to you by your health care provider. Make sure you discuss any questions you have with your health care provider.   There are medications that are specific for osteoporosis; I want to consult Dr. [name] and ask about using one of these medication along with the medication that will be prescribed after your breast surgery.

## 2014-10-20 NOTE — Progress Notes (Signed)
Pt may come tomorrow for EKG. Bring all medications.

## 2014-10-21 ENCOUNTER — Encounter (HOSPITAL_BASED_OUTPATIENT_CLINIC_OR_DEPARTMENT_OTHER)
Admission: RE | Admit: 2014-10-21 | Discharge: 2014-10-21 | Disposition: A | Discharge status: Home / Self Care | Payer: Medicare Other | Source: Ambulatory Visit | Attending: General Surgery | Admitting: General Surgery

## 2014-10-21 DIAGNOSIS — C50912 Malignant neoplasm of unspecified site of left female breast: Secondary | ICD-10-CM | POA: Diagnosis not present

## 2014-10-21 DIAGNOSIS — Z87442 Personal history of urinary calculi: Secondary | ICD-10-CM | POA: Diagnosis not present

## 2014-10-21 DIAGNOSIS — F419 Anxiety disorder, unspecified: Secondary | ICD-10-CM | POA: Diagnosis not present

## 2014-10-21 DIAGNOSIS — E780 Pure hypercholesterolemia: Secondary | ICD-10-CM | POA: Diagnosis not present

## 2014-10-21 DIAGNOSIS — K219 Gastro-esophageal reflux disease without esophagitis: Secondary | ICD-10-CM | POA: Diagnosis not present

## 2014-10-21 DIAGNOSIS — I10 Essential (primary) hypertension: Secondary | ICD-10-CM | POA: Diagnosis not present

## 2014-10-21 NOTE — Progress Notes (Signed)
Dr. Al Corpus reviewed EKG and compared with 2013 EKG- ok for surgery.

## 2014-10-22 ENCOUNTER — Encounter (HOSPITAL_BASED_OUTPATIENT_CLINIC_OR_DEPARTMENT_OTHER): Payer: Self-pay | Admitting: *Deleted

## 2014-10-22 ENCOUNTER — Encounter (HOSPITAL_COMMUNITY)
Admission: RE | Admit: 2014-10-22 | Discharge: 2014-10-22 | Disposition: A | Discharge status: Home / Self Care | Payer: Medicare Other | Source: Ambulatory Visit | Attending: General Surgery | Admitting: General Surgery

## 2014-10-22 ENCOUNTER — Ambulatory Visit (HOSPITAL_BASED_OUTPATIENT_CLINIC_OR_DEPARTMENT_OTHER): Payer: Medicare Other | Admitting: Certified Registered"

## 2014-10-22 ENCOUNTER — Ambulatory Visit (HOSPITAL_BASED_OUTPATIENT_CLINIC_OR_DEPARTMENT_OTHER)
Admission: RE | Admit: 2014-10-22 | Discharge: 2014-10-22 | Disposition: A | Discharge status: Home / Self Care | Payer: Medicare Other | Source: Ambulatory Visit | Attending: General Surgery | Admitting: General Surgery

## 2014-10-22 ENCOUNTER — Encounter (HOSPITAL_BASED_OUTPATIENT_CLINIC_OR_DEPARTMENT_OTHER)
Admission: RE | Disposition: A | Discharge status: Home / Self Care | Payer: Self-pay | Source: Ambulatory Visit | Attending: General Surgery

## 2014-10-22 DIAGNOSIS — F419 Anxiety disorder, unspecified: Secondary | ICD-10-CM | POA: Insufficient documentation

## 2014-10-22 DIAGNOSIS — I10 Essential (primary) hypertension: Secondary | ICD-10-CM | POA: Diagnosis not present

## 2014-10-22 DIAGNOSIS — Z87442 Personal history of urinary calculi: Secondary | ICD-10-CM | POA: Insufficient documentation

## 2014-10-22 DIAGNOSIS — K219 Gastro-esophageal reflux disease without esophagitis: Secondary | ICD-10-CM | POA: Diagnosis not present

## 2014-10-22 DIAGNOSIS — R079 Chest pain, unspecified: Secondary | ICD-10-CM | POA: Diagnosis not present

## 2014-10-22 DIAGNOSIS — E780 Pure hypercholesterolemia: Secondary | ICD-10-CM | POA: Diagnosis not present

## 2014-10-22 DIAGNOSIS — G8918 Other acute postprocedural pain: Secondary | ICD-10-CM | POA: Diagnosis not present

## 2014-10-22 DIAGNOSIS — C50912 Malignant neoplasm of unspecified site of left female breast: Secondary | ICD-10-CM | POA: Insufficient documentation

## 2014-10-22 DIAGNOSIS — C50412 Malignant neoplasm of upper-outer quadrant of left female breast: Secondary | ICD-10-CM

## 2014-10-22 HISTORY — PX: BREAST LUMPECTOMY WITH SENTINEL LYMPH NODE BIOPSY: SHX5597

## 2014-10-22 SURGERY — BREAST LUMPECTOMY WITH SENTINEL LYMPH NODE BX
Anesthesia: General | Site: Breast | Laterality: Left

## 2014-10-22 MED ORDER — METHYLENE BLUE 1 % INJ SOLN
INTRAMUSCULAR | Status: AC
  Filled 2014-10-22: qty 10

## 2014-10-22 MED ORDER — ONDANSETRON HCL 4 MG/2ML IJ SOLN
INTRAMUSCULAR | Status: DC | PRN
  Administered 2014-10-22: 4 mg via INTRAVENOUS

## 2014-10-22 MED ORDER — FENTANYL CITRATE (PF) 100 MCG/2ML IJ SOLN
INTRAMUSCULAR | Status: AC
  Filled 2014-10-22: qty 2

## 2014-10-22 MED ORDER — HYDROMORPHONE HCL 1 MG/ML IJ SOLN
INTRAMUSCULAR | Status: AC
Start: 2014-10-22 — End: 2014-10-22
  Filled 2014-10-22: qty 1

## 2014-10-22 MED ORDER — ONDANSETRON HCL 4 MG/2ML IJ SOLN: 4.0000 mg | Freq: Once | INTRAMUSCULAR | Status: DC | PRN

## 2014-10-22 MED ORDER — MEPERIDINE HCL 25 MG/ML IJ SOLN: 6.2500 mg | INTRAMUSCULAR | Status: DC | PRN

## 2014-10-22 MED ORDER — OXYCODONE-ACETAMINOPHEN 5-325 MG PO TABS: 1.0000 | ORAL_TABLET | ORAL | Status: DC | PRN

## 2014-10-22 MED ORDER — BUPIVACAINE-EPINEPHRINE (PF) 0.5% -1:200000 IJ SOLN
INTRAMUSCULAR | Status: DC | PRN
  Administered 2014-10-22: 30 mL

## 2014-10-22 MED ORDER — PROPOFOL 10 MG/ML IV BOLUS
INTRAVENOUS | Status: DC | PRN
  Administered 2014-10-22: 150 mg via INTRAVENOUS

## 2014-10-22 MED ORDER — LIDOCAINE HCL (CARDIAC) 20 MG/ML IV SOLN
INTRAVENOUS | Status: DC | PRN
  Administered 2014-10-22: 30 mg via INTRAVENOUS

## 2014-10-22 MED ORDER — MIDAZOLAM HCL 2 MG/2ML IJ SOLN
INTRAMUSCULAR | Status: AC
  Filled 2014-10-22: qty 2

## 2014-10-22 MED ORDER — CHLORHEXIDINE GLUCONATE 4 % EX LIQD: 1.0000 "application " | Freq: Once | CUTANEOUS | Status: DC

## 2014-10-22 MED ORDER — CEFAZOLIN SODIUM-DEXTROSE 2-3 GM-% IV SOLR
INTRAVENOUS | Status: AC
  Filled 2014-10-22: qty 50

## 2014-10-22 MED ORDER — FENTANYL CITRATE (PF) 100 MCG/2ML IJ SOLN
INTRAMUSCULAR | Status: DC | PRN
  Administered 2014-10-22: 25 ug via INTRAVENOUS
  Administered 2014-10-22: 50 ug via INTRAVENOUS

## 2014-10-22 MED ORDER — OXYCODONE HCL 5 MG PO TABS
ORAL_TABLET | ORAL | Status: AC
Start: 2014-10-22 — End: 2014-10-22
  Filled 2014-10-22: qty 1

## 2014-10-22 MED ORDER — MIDAZOLAM HCL 2 MG/2ML IJ SOLN
1.0000 mg | INTRAMUSCULAR | Status: DC | PRN
  Administered 2014-10-22: 2 mg via INTRAVENOUS

## 2014-10-22 MED ORDER — HYDROMORPHONE HCL 1 MG/ML IJ SOLN
0.2500 mg | INTRAMUSCULAR | Status: DC | PRN
  Administered 2014-10-22 (×3): 0.5 mg via INTRAVENOUS

## 2014-10-22 MED ORDER — BUPIVACAINE HCL (PF) 0.25 % IJ SOLN
INTRAMUSCULAR | Status: DC | PRN
  Administered 2014-10-22: 20 mL

## 2014-10-22 MED ORDER — HYDROMORPHONE HCL 1 MG/ML IJ SOLN
INTRAMUSCULAR | Status: AC
  Filled 2014-10-22: qty 1

## 2014-10-22 MED ORDER — TECHNETIUM TC 99M SULFUR COLLOID FILTERED
1.0000 | Freq: Once | INTRAVENOUS | Status: AC | PRN
  Administered 2014-10-22: 1 via INTRADERMAL

## 2014-10-22 MED ORDER — FENTANYL CITRATE (PF) 100 MCG/2ML IJ SOLN
50.0000 ug | INTRAMUSCULAR | Status: DC | PRN
  Administered 2014-10-22: 100 ug via INTRAVENOUS

## 2014-10-22 MED ORDER — CEFAZOLIN SODIUM-DEXTROSE 2-3 GM-% IV SOLR
2.0000 g | INTRAVENOUS | Status: AC
  Administered 2014-10-22: 2 g via INTRAVENOUS

## 2014-10-22 MED ORDER — LACTATED RINGERS IV SOLN
INTRAVENOUS | Status: DC
  Administered 2014-10-22 (×2): via INTRAVENOUS

## 2014-10-22 MED ORDER — DEXAMETHASONE SODIUM PHOSPHATE 4 MG/ML IJ SOLN
INTRAMUSCULAR | Status: DC | PRN
  Administered 2014-10-22: 10 mg via INTRAVENOUS

## 2014-10-22 MED ORDER — OXYCODONE HCL 5 MG PO TABS
5.0000 mg | ORAL_TABLET | Freq: Once | ORAL | Status: AC | PRN
  Administered 2014-10-22: 5 mg via ORAL

## 2014-10-22 MED ORDER — GLYCOPYRROLATE 0.2 MG/ML IJ SOLN: 0.2000 mg | Freq: Once | INTRAMUSCULAR | Status: DC | PRN

## 2014-10-22 MED ORDER — EPHEDRINE SULFATE 50 MG/ML IJ SOLN
INTRAMUSCULAR | Status: DC | PRN
  Administered 2014-10-22: 12.5 mg via INTRAVENOUS

## 2014-10-22 MED ORDER — BUPIVACAINE HCL (PF) 0.25 % IJ SOLN
INTRAMUSCULAR | Status: AC
  Filled 2014-10-22: qty 30

## 2014-10-22 SURGICAL SUPPLY — 48 items
APPLIER CLIP 11 MED OPEN (CLIP) ×3
APR CLP MED 11 20 MLT OPN (CLIP) ×1
BLADE SURG 15 STRL LF DISP TIS (BLADE) ×1 IMPLANT
BLADE SURG 15 STRL SS (BLADE) ×3
CANISTER SUCT 1200ML W/VALVE (MISCELLANEOUS) ×3 IMPLANT
CHLORAPREP W/TINT 26ML (MISCELLANEOUS) ×3 IMPLANT
CLIP APPLIE 11 MED OPEN (CLIP) IMPLANT
COVER BACK TABLE 60X90IN (DRAPES) ×3 IMPLANT
COVER MAYO STAND STRL (DRAPES) ×3 IMPLANT
COVER PROBE W GEL 5X96 (DRAPES) ×3 IMPLANT
DECANTER SPIKE VIAL GLASS SM (MISCELLANEOUS) IMPLANT
DEVICE DUBIN W/COMP PLATE 8390 (MISCELLANEOUS) ×2 IMPLANT
DRAPE LAPAROSCOPIC ABDOMINAL (DRAPES) ×3 IMPLANT
DRAPE UTILITY XL STRL (DRAPES) ×3 IMPLANT
ELECT COATED BLADE 2.86 ST (ELECTRODE) ×3 IMPLANT
ELECT REM PT RETURN 9FT ADLT (ELECTROSURGICAL) ×3
ELECTRODE REM PT RTRN 9FT ADLT (ELECTROSURGICAL) ×1 IMPLANT
GLOVE BIO SURGEON STRL SZ7.5 (GLOVE) ×5 IMPLANT
GLOVE BIOGEL PI IND STRL 7.0 (GLOVE) IMPLANT
GLOVE BIOGEL PI IND STRL 7.5 (GLOVE) IMPLANT
GLOVE BIOGEL PI INDICATOR 7.0 (GLOVE) ×4
GLOVE BIOGEL PI INDICATOR 7.5 (GLOVE) ×2
GLOVE ECLIPSE 6.5 STRL STRAW (GLOVE) ×2 IMPLANT
GLOVE EXAM NITRILE MD LF STRL (GLOVE) ×2 IMPLANT
GLOVE SURG SS PI 7.0 STRL IVOR (GLOVE) ×2 IMPLANT
GLOVE SURG SS PI 7.5 STRL IVOR (GLOVE) ×2 IMPLANT
GOWN STRL REUS W/ TWL LRG LVL3 (GOWN DISPOSABLE) ×2 IMPLANT
GOWN STRL REUS W/TWL LRG LVL3 (GOWN DISPOSABLE) ×12
KIT MARKER MARGIN INK (KITS) ×2 IMPLANT
LIQUID BAND (GAUZE/BANDAGES/DRESSINGS) ×3 IMPLANT
NDL HYPO 25X1 1.5 SAFETY (NEEDLE) ×1 IMPLANT
NDL SAFETY ECLIPSE 18X1.5 (NEEDLE) IMPLANT
NEEDLE HYPO 18GX1.5 SHARP (NEEDLE)
NEEDLE HYPO 25X1 1.5 SAFETY (NEEDLE) ×3 IMPLANT
NS IRRIG 1000ML POUR BTL (IV SOLUTION) ×3 IMPLANT
PACK BASIN DAY SURGERY FS (CUSTOM PROCEDURE TRAY) ×3 IMPLANT
PENCIL BUTTON HOLSTER BLD 10FT (ELECTRODE) ×3 IMPLANT
SLEEVE SCD COMPRESS KNEE MED (MISCELLANEOUS) ×3 IMPLANT
SPONGE LAP 18X18 X RAY DECT (DISPOSABLE) ×3 IMPLANT
SUT MON AB 4-0 PC3 18 (SUTURE) ×5 IMPLANT
SUT SILK 3 0 PS 1 (SUTURE) IMPLANT
SUT VICRYL 3-0 CR8 SH (SUTURE) ×5 IMPLANT
SYR CONTROL 10ML LL (SYRINGE) ×3 IMPLANT
TOWEL OR 17X24 6PK STRL BLUE (TOWEL DISPOSABLE) ×3 IMPLANT
TOWEL OR NON WOVEN STRL DISP B (DISPOSABLE) ×3 IMPLANT
TUBE CONNECTING 20'X1/4 (TUBING) ×1
TUBE CONNECTING 20X1/4 (TUBING) ×2 IMPLANT
YANKAUER SUCT BULB TIP NO VENT (SUCTIONS) ×3 IMPLANT

## 2014-10-22 NOTE — Op Note (Signed)
10/22/2014  12:27 PM  PATIENT:  [name]  80 y.o. female  PRE-OPERATIVE DIAGNOSIS:  Left Breast Cancer  POST-OPERATIVE DIAGNOSIS:  Left Breast Cancer  PROCEDURE:  Procedure(s): BREAST LUMPECTOMY WITH SENTINEL LYMPH NODE BIOPSY (Left)  SURGEON:  Surgeon(s) and Role:    * Jovita Kussmaul, MD - Primary  PHYSICIAN ASSISTANT:   ASSISTANTS: none   ANESTHESIA:   general  EBL:  Total I/O In: 1100 [I.V.:1100] Out: -   BLOOD ADMINISTERED:none  DRAINS: none   LOCAL MEDICATIONS USED:  MARCAINE     SPECIMEN:  Source of Specimen:  left breast tissue with additional superior and medial margins and sentinel node  DISPOSITION OF SPECIMEN:  PATHOLOGY  COUNTS:  YES  TOURNIQUET:  * No tourniquets in log *  DICTATION: .Dragon Dictation  After informed consent was obtained the patient was brought to the operating room and placed in the supine position on the operating room table. After adequate induction of general anesthesia the patient's left chest, breasts, and axilla were prepped with ChloraPrep, allowed to dry, and draped in usual sterile manner. Earlier in the day the patient underwent injection of 1 mCi of technetium sulfur colloid in the subareolar position on the left. The neoprobe was used to identify a hot spot in the left axilla. A small transversely oriented incision was made overlying the hot spot in the left axilla. This incision was carried through the skin and subcutaneous tissue sharply with the electrocautery until the axilla was entered. A [place] retractor was deployed. Using the neoprobe to direct blunt hemostat dissection we were able to identify a hot lymph node. The lymph node was excised by sharp dissection with the electrocautery and the lymphatics were controlled with clips. Ex vivo counts on sentinel node #1 were approximately 100. No other hot or palpable lymph nodes were identified in the left axilla. The area was infiltrated with quarter percent Marcaine. The  deep layer of the wound was closed with interrupted 3-0 Vicryl stitches. The skin was then closed with a running 4-0 Monocryl subcuticular stitch. Attention was then turned to the left breast. There was a palpable mass in the lateral aspect of the left breast. An elliptical incision was made in the skin overlying the palpable mass with a 15 blade knife. This incision was carried through the skin and subcutaneous tissue sharply with the electrocautery. While palpating the mass a circular portion of breast tissue was excised sharply around the mass. The dissection was carried all the way to the chest wall. Once the mass was removed it was oriented with the appropriate paint colors. A specimen radiograph showed that the clip was in the specimen. On palpating the specimen felt like we were close on the superior and medial margins and additional margins in these 2 areas were taken sharply with the electrocautery. Each specimen was marked with a stitch on the new margin. These were sent separately to pathology. The wound was then infiltrated with quarter percent Marcaine and irrigated with saline. Hemostasis was achieved using the Bovie electrocautery. The wound was then closed with deep layers of interrupted 3-0 Vicryl stitches and the skin was closed with interrupted 4-0 Monocryl subcuticular stitches. Dermabond dressings were applied. The patient tolerated the procedure well. At the end of the case all needle sponge and instrument counts were correct. The patient was then awakened and taken to recovery in stable condition.  PLAN OF CARE: Discharge to home after PACU  PATIENT DISPOSITION:  PACU - hemodynamically stable.  Delay start of Pharmacological VTE agent (>24hrs) due to surgical blood loss or risk of bleeding: not applicable  

## 2014-10-22 NOTE — Progress Notes (Signed)
Emotional support during breast injections °

## 2014-10-22 NOTE — H&P (Signed)
Coleen T. Gravelle 10/13/2014 10:16 AM Location: [facility] Patient #: [id number] DOB: [birth year] Undefined / Language: Suszanne Conners / Race: Undefined Female  History of Present Illness Sammuel Hines. Marlou Starks MD; 10/13/2014 4:23 PM) The patient is a 80 year old female who presents with breast cancer. We're asked to see the patient in consultation by Dr. Luberta Robertson to evaluate her for a left-sided breast cancer. The patient is a 80 year old black female who recently went for a annual physical. At that time her medical doctor found a lump in the lateral aspect of her left breast. She denies any breast pain or discharge from the nipple. She states that it has been 8-10 years since her last mammogram. The mass measured 2 cm by ultrasound. It was biopsied and came back as an invasive ductal cancer. It was ER and PR positive and HER-2 negative with a Ki-67 of 5%. She does not take any hormone replacement.   Other Problems Anderson Malta [place], RMA; 10/13/2014 10:16 AM) Breast Cancer Gastric Ulcer Gastroesophageal Reflux Disease Hemorrhoids High blood pressure Hypercholesterolemia Kidney Stone Lump In Breast Transfusion history  Past Surgical History Anderson Malta [place], RMA; 10/13/2014 10:16 AM) Breast Biopsy Bilateral. Oral Surgery  Diagnostic Studies History Anderson Malta [place], [place]; 10/13/2014 10:16 AM) Colonoscopy 5-10 years ago Mammogram within last year Pap Smear >5 years ago  Social History Anderson Malta [place], RMA; 10/13/2014 10:16 AM) Caffeine use Tea. No alcohol use No drug use Tobacco use Never smoker.  Family History Anderson Malta [place], [place]; 10/13/2014 10:16 AM) Cerebrovascular Accident Father. Diabetes Mellitus Sister. Heart Disease Father. Hypertension Mother. Malignant Neoplasm Of Pancreas Mother. Migraine Headache Mother.  Pregnancy / Birth History Anderson Malta [place], [place]; 10/13/2014 10:16 AM) Age at menarche 31 years. Age of menopause 6-50 Contraceptive  History Oral contraceptives. Gravida 5 Irregular periods Maternal age 34-25 Para 4  Review of Systems Anderson Malta [place] RMA; 10/13/2014 10:16 AM) General Not Present- Appetite Loss, Chills, Fatigue, Fever, Night Sweats, Weight Gain and Weight Loss. Skin Not Present- Change in Wart/Mole, Dryness, Hives, Jaundice, New Lesions, Non-Healing Wounds, Rash and Ulcer. HEENT Present- Ringing in the Ears and Wears glasses/contact lenses. Not Present- Earache, Hearing Loss, Hoarseness, Nose Bleed, Oral Ulcers, Seasonal Allergies, Sinus Pain, Sore Throat, Visual Disturbances and Yellow Eyes. Respiratory Not Present- Bloody sputum, Chronic Cough, Difficulty Breathing, Snoring and Wheezing. Breast Present- Breast Mass. Not Present- Breast Pain, Nipple Discharge and Skin Changes. Cardiovascular Not Present- Chest Pain, Difficulty Breathing Lying Down, Leg Cramps, Palpitations, Rapid Heart Rate, Shortness of Breath and Swelling of Extremities. Gastrointestinal Not Present- Abdominal Pain, Bloating, Bloody Stool, Change in Bowel Habits, Chronic diarrhea, Constipation, Difficulty Swallowing, Excessive gas, Gets full quickly at meals, Hemorrhoids, Indigestion, Nausea, Rectal Pain and Vomiting. Female Genitourinary Not Present- Frequency, Nocturia, Painful Urination, Pelvic Pain and Urgency. Musculoskeletal Not Present- Back Pain, Joint Pain, Joint Stiffness, Muscle Pain, Muscle Weakness and Swelling of Extremities. Neurological Not Present- Decreased Memory, Fainting, Headaches, Numbness, Seizures, Tingling, Tremor, Trouble walking and Weakness. Psychiatric Not Present- Anxiety, Bipolar, Change in Sleep Pattern, Depression, Fearful and Frequent crying. Endocrine Not Present- Cold Intolerance, Excessive Hunger, Hair Changes, Heat Intolerance, Hot flashes and New Diabetes. Hematology Not Present- Easy Bruising, Excessive bleeding, Gland problems, HIV and Persistent Infections.   Physical Exam Eddie Dibbles S. Marlou Starks MD;  10/13/2014 4:23 PM) General Mental Status-Alert. General Appearance-Consistent with stated age. Hydration-Well hydrated. Voice-Normal.  Head and Neck Head-normocephalic, atraumatic with no lesions or palpable masses. Trachea-midline. Thyroid Gland Characteristics - normal size and consistency.  Eye Eyeball - Bilateral-Extraocular movements intact. Sclera/Conjunctiva -  Bilateral-No scleral icterus.  Chest and Lung Exam Chest and lung exam reveals -quiet, even and easy respiratory effort with no use of accessory muscles and on auscultation, normal breath sounds, no adventitious sounds and normal vocal resonance. Inspection Chest Wall - Normal. Back - normal.  Breast Note: There is a palpable 3 cm mass in the 3:00 position of the left breast. There is no palpable mass in the right breast. There is no palpable axillary, supraclavicular, or cervical lymphadenopathy.   Cardiovascular Cardiovascular examination reveals -normal heart sounds, regular rate and rhythm with no murmurs and normal pedal pulses bilaterally.  Abdomen Inspection Inspection of the abdomen reveals - No Hernias. Skin - Scar - no surgical scars. Palpation/Percussion Palpation and Percussion of the abdomen reveal - Soft, Non Tender, No Rebound tenderness, No Rigidity (guarding) and No hepatosplenomegaly. Auscultation Auscultation of the abdomen reveals - Bowel sounds normal.  Neurologic Neurologic evaluation reveals -alert and oriented x 3 with no impairment of recent or remote memory. Mental Status-Normal.  Musculoskeletal Normal Exam - Left-Upper Extremity Strength Normal and Lower Extremity Strength Normal. Normal Exam - Right-Upper Extremity Strength Normal and Lower Extremity Strength Normal.  Lymphatic Head & Neck  General Head & Neck Lymphatics: Bilateral - Description - Normal. Axillary  General Axillary Region: Bilateral - Description - Normal. Tenderness - Non  Tender. Femoral & Inguinal  Generalized Femoral & Inguinal Lymphatics: Bilateral - Description - Normal. Tenderness - Non Tender.    Assessment & Plan Eddie Dibbles S. Marlou Starks MD; 10/13/2014 4:25 PM) PRIMARY CANCER OF UPPER OUTER QUADRANT OF LEFT FEMALE BREAST (174.4  C50.412) Impression: The patient appears to have a 2 cm cancer in the outer aspect of the left breast. I have talked to her in detail about the different options for treatment and at this point she favors breast conservation. I think this is a very reasonable way to treat her cancer. Since her nodes are clinically negative she is also a good candidate for sentinel node mapping. I have discussed with her in detail the risks and benefits of the operation to do this as well as some of the technical aspects and she understands and wishes to proceed     Signed by Luella Cook, MD (10/13/2014 4:25 PM)

## 2014-10-22 NOTE — Anesthesia Preprocedure Evaluation (Signed)
Anesthesia Evaluation  Patient identified by MRN, date of birth, ID band Patient awake    Reviewed: Allergy & Precautions, NPO status , Patient's Chart, lab work & pertinent test results  Airway Mallampati: I  TM Distance: >3 FB Neck ROM: Full    Dental   Pulmonary    Pulmonary exam normal       Cardiovascular hypertension, Pt. on medications Normal cardiovascular exam    Neuro/Psych Anxiety    GI/Hepatic GERD-  Medicated and Controlled,  Endo/Other    Renal/GU      Musculoskeletal   Abdominal   Peds  Hematology   Anesthesia Other Findings   Reproductive/Obstetrics                             Anesthesia Physical Anesthesia Plan  ASA: II  Anesthesia Plan: General   Post-op Pain Management:    Induction: Intravenous  Airway Management Planned: LMA  Additional Equipment:   Intra-op Plan:   Post-operative Plan: Extubation in OR  Informed Consent: I have reviewed the patients History and Physical, chart, labs and discussed the procedure including the risks, benefits and alternatives for the proposed anesthesia with the patient or authorized representative who has indicated his/her understanding and acceptance.     Plan Discussed with: CRNA and Surgeon  Anesthesia Plan Comments:         Anesthesia Quick Evaluation

## 2014-10-22 NOTE — Interval H&P Note (Signed)
History and Physical Interval Note:  10/22/2014 10:59 AM  [name]  has presented today for surgery, with the diagnosis of Left Breat Cancer  The various methods of treatment have been discussed with the patient and family. After consideration of risks, benefits and other options for treatment, the patient has consented to  Procedure(s): BREAST LUMPECTOMY WITH SENTINEL LYMPH NODE BX (Left) as a surgical intervention .  The patient's history has been reviewed, patient examined, no change in status, stable for surgery.  I have reviewed the patient's chart and labs.  Questions were answered to the patient's satisfaction.     TOTH III,PAUL S

## 2014-10-22 NOTE — Transfer of Care (Signed)
Immediate Anesthesia Transfer of Care Note  Patient: [name]  Procedure(s) Performed: Procedure(s): BREAST LUMPECTOMY WITH SENTINEL LYMPH NODE BIOPSY (Left)  Patient Location: PACU  Anesthesia Type:GA combined with regional for post-op pain  Level of Consciousness: awake and patient cooperative  Airway & Oxygen Therapy: Patient Spontanous Breathing and Patient connected to face mask oxygen  Post-op Assessment: Report given to RN and Post -op Vital signs reviewed and stable  Post vital signs: Reviewed and stable  Last Vitals:  Filed Vitals:   10/22/14 1234  BP:   Pulse: 84  Temp:   Resp: 26    Complications: No apparent anesthesia complications

## 2014-10-22 NOTE — Discharge Instructions (Signed)

## 2014-10-22 NOTE — Anesthesia Postprocedure Evaluation (Signed)
Anesthesia Post Note  Patient: [name]  Procedure(s) Performed: Procedure(s) (LRB): BREAST LUMPECTOMY WITH SENTINEL LYMPH NODE BIOPSY (Left)  Anesthesia type: general  Patient location: PACU  Post pain: Pain level controlled  Post assessment: Patient's Cardiovascular Status Stable  Last Vitals:  Filed Vitals:   10/22/14 1530  BP: 175/72  Pulse: 55  Temp: 36.6 C  Resp: 16    Post vital signs: Reviewed and stable  Level of consciousness: sedated  Complications: No apparent anesthesia complications

## 2014-10-22 NOTE — Progress Notes (Signed)
Assisted Dr. Ossey with left, ultrasound guided, pectoralis block. Side rails up, monitors on throughout procedure. See vital signs in flow sheet. Tolerated Procedure well. 

## 2014-10-22 NOTE — Anesthesia Procedure Notes (Addendum)
Anesthesia Regional Block:  Pectoralis block  Pre-Anesthetic Checklist: ,, timeout performed, Correct Patient, Correct Site, Correct Laterality, Correct Procedure, Correct Position, site marked, Risks and benefits discussed,  Surgical consent,  Pre-op evaluation,  At surgeon's request and post-op pain management  Laterality: Left  Prep: chloraprep       Needles:  Injection technique: Single-shot     Needle Length: 9cm 9 cm Needle Gauge: 21 and 21 G    Additional Needles:  Procedures: ultrasound guided (picture in chart) Pectoralis block Narrative:  Start time: 10/22/2014 10:10 AM End time: 10/22/2014 10:20 AM Injection made incrementally with aspirations every 5 mL.  Performed by: Personally  Anesthesiologist: Lillia Abed  Additional Notes: Monitors applied. Patient sedated. Sterile prep and drape,hand hygiene and sterile gloves were used. Relevant anatomy identified.Needle position confirmed.Local anesthetic injected incrementally after negative aspiration. Local anesthetic spread visualized. Vascular puncture avoided. No complications. Image printed for medical record.The patient tolerated the procedure well.       Procedure Name: LMA Insertion Date/Time: 10/22/2014 11:14 AM Performed by: Allis Quirarte D Pre-anesthesia Checklist: Patient identified, Emergency Drugs available, Suction available and Patient being monitored Patient Re-evaluated:Patient Re-evaluated prior to inductionOxygen Delivery Method: Circle System Utilized Preoxygenation: Pre-oxygenation with 100% oxygen Intubation Type: IV induction Ventilation: Mask ventilation without difficulty LMA: LMA inserted LMA Size: 4.0 Number of attempts: 1 Airway Equipment and Method: Bite block Placement Confirmation: positive ETCO2 Tube secured with: Tape Dental Injury: Teeth and Oropharynx as per pre-operative assessment

## 2014-10-24 NOTE — Progress Notes (Signed)
Subjective:    Patient ID: [name], female    DOB: 04-28-1935, 80 y.o.   MRN: [id number]  HPI  This 80 y.o female has HTN, stable on current medications but having elevated readings at recent encounters elsewhere in the system (pre-op for breast biopsy procedure). BP on 10/22/14 range from 148-169/63-84. Pt reports home readings- 150-160/75-90.  Medication compliance is excellent. Pt denies fatigue, diaphoresis, vision disturbances, CP or tightness, palpitations, edema, SOB or DOE, cough, HA, dizziness, numbness or syncope. Her weight is stable.  Pt was recently diagnosed w/ L breast cancer (in staging process). She has meet w/ her oncology team and mentions that after scheduled procedure, she will be taking a medication that starts with "A". (DEXA imaging performed September 27, 2014 demonstrated osteoporosis; T-scores below -2.5. Her fracture risk is high). Currently takes OTC Vitamin D and a multivitamin. Pt took HRT in the past but not in the last 5 years.   Patient Active Problem List   Diagnosis Date Noted  . Osteoporosis 10/20/2014  . Breast cancer of lower-outer quadrant of left female breast 10/11/2014  . Pure hypercholesterolemia 04/08/2014  . Anxiety   . PUD (peptic ulcer disease)   . HTN (hypertension), benign 06/29/2011  . GERD (gastroesophageal reflux disease) 06/29/2011    Prior to Admission medications   Medication Sig Start Date End Date Taking? Authorizing Provider  Ascorbic Acid (VITAMIN C) 1000 MG tablet Take 1,000 mg by mouth daily.   Yes Historical Provider, MD  aspirin 81 MG tablet Take 81 mg by mouth daily.   Yes Historical Provider, MD  fish oil-omega-3 fatty acids 1000 MG capsule Take 1 g by mouth daily.   Yes Historical Provider, MD  GARLIC PO Take by mouth daily.   Yes Historical Provider, MD  Multiple Vitamin (MULTIVITAMIN) tablet Take 1 tablet by mouth daily.   Yes Historical Provider, MD  OVER THE COUNTER MEDICATION OTC Vitamin D 1000 units taking  everyday   Yes Historical Provider, MD  co-enzyme Q-10 30 MG capsule Take 30 mg by mouth 3 (three) times daily.    Historical Provider, MD  losartan (COZAAR) 50 MG tablet Take 1 tablet (50 mg total) by mouth daily.    Barton Fanny, MD  oxyCODONE-acetaminophen (ROXICET) 5-325 MG per tablet Take 1-2 tablets by mouth every 4 (four) hours as needed. 10/22/14   Jovita Kussmaul, MD   History   Social History  . Marital Status: Married    Spouse Name: N/A  . Number of Children: N/A  . Years of Education: N/A   Occupational History  . retired    Social History Main Topics  . Smoking status: Never Smoker   . Smokeless tobacco: Not on file  . Alcohol Use: No  . Drug Use: No  . Sexual Activity: Not on file   Other Topics Concern  . Not on file   Social History Narrative    Family History  Problem Relation Age of Onset  . Cancer Mother     pancreastic  . Stroke Father   . Hypertension Brother   . Hypertension Sister   . Hypertension Brother   . Heart disease Brother   . Hypertension Sister     Review of Systems  Constitutional: Negative for diaphoresis, appetite change, fatigue and unexpected weight change.  Eyes: Negative for visual disturbance.  Respiratory: Negative for cough, chest tightness and shortness of breath.   Cardiovascular: Negative for chest pain, palpitations and leg swelling.  Skin:  Negative.   Neurological: Negative for dizziness, syncope, speech difficulty, weakness, light-headedness, numbness and headaches.  Psychiatric/Behavioral: Negative.       Objective:   Physical Exam  Constitutional: She is oriented to person, place, and time. She appears well-developed and well-nourished. No distress.  Blood pressure 174/82, pulse 72, temperature 98.1 F (36.7 C), temperature source Oral, resp. rate 16, height 5\' 8"  (1.727 m), weight 185 lb 3.2 oz (84.006 kg), SpO2 98 %.   HENT:  Head: Normocephalic and atraumatic.  Right Ear: External ear normal.  Left  Ear: External ear normal.  Nose: Nose normal.  Mouth/Throat: Oropharynx is clear and moist.  Eyes: Conjunctivae and EOM are normal. No scleral icterus.  Neck: Normal range of motion. Neck supple.  Cardiovascular: Normal rate, regular rhythm and normal heart sounds.  Exam reveals no gallop.   No murmur heard. Pulmonary/Chest: Effort normal and breath sounds normal.  Musculoskeletal: Normal range of motion. She exhibits no edema or tenderness.  Neurological: She is alert and oriented to person, place, and time. No cranial nerve deficit. Coordination normal.  Skin: Skin is warm and dry. No rash noted. She is not diaphoretic. No erythema.  Psychiatric: She has a normal mood and affect. Her behavior is normal. Judgment and thought content normal.  Nursing note and vitals reviewed.     Assessment & Plan:  HTN (hypertension), benign-  Stable on current medication; pt will continue to monitor and report any new symptoms. RTC in 3 months to f/u w/ Dr. Lorelei Pont.  Osteoporosis- Pt currently in treatment for breast cancer and is having staging procedure in 2 days. I will send a message to her oncologist regarding recommendation for treatment of this condition.

## 2014-10-25 ENCOUNTER — Encounter (HOSPITAL_BASED_OUTPATIENT_CLINIC_OR_DEPARTMENT_OTHER): Payer: Self-pay | Admitting: General Surgery

## 2014-10-27 ENCOUNTER — Other Ambulatory Visit: Payer: Self-pay | Admitting: General Surgery

## 2014-10-29 ENCOUNTER — Other Ambulatory Visit: Payer: Self-pay | Admitting: Oncology

## 2014-10-29 NOTE — Progress Notes (Unsigned)
Discussed osteoporosis w Dr Leward Quan, will address in July visit    Patient Demographics     Patient Name Sex DOB SSN Address Phone    [name], [name] Female Apr 10, 1935 [id number] [place] [address] [phone] (Home) *Preferred* [phone] (Mobile)      RE: Osteoporosis medication  Due: 3 days ago  Received: 3 days ago    Barton Fanny, MD  Chauncey Cruel, MD            Dear Dr. [name];   Thank you so much for responding so promptly. It would be such a help if you would take care of this issue. I am optimistic that Ms. Richards will have a great outcome; she is so healthy for her age.   I will be retiring as of November 03, 2014 so it is helpful that this issue will not "fall through the cracks"!   Again, many thanks for all the wonderful care you have provided to our mutual patients.   Take care...  Ellsworth Lennox       Previous Messages     ----- Message -----   From: Chauncey Cruel, MD   Sent: 10/25/2014  9:13 PM    To: Barton Fanny, MD  Subject: RE: Osteoporosis medication            This is a common problem w our patients, as you may imagine. We usually offer them a choice of  (a) oral bisphosphonates (faslodex, boniva)-- they don't like all the restrictions on taking it on empty stomach, etc, or the reflux problem, so most opt for  (b) zometa (Reclast) IV yearly or  (c) Prolia sQ twice a year.   I always discuss the rare occurrence of osteonecrosis of the jaw and the possibility of bone pain 1-3 days after a treatment and transient hypocalcemia   If you would prefer we take care of these at our end, that would not be a problem! Sometimes I think we're more an osteopenia clinic than anything else!   gus  ----- Message -----   From: Barton Fanny, MD   Sent: 10/20/2014  1:37 PM    To: Chauncey Cruel, MD  Subject: Osteoporosis medication              Good  Afternoon, Dr. [name];   I am writing about our mutual patient, [name]. You are treating her for DCIS breast cancer. She was in today for HTN follow-up and reports that she will be taking a medication (she says " it starts with an A") after her lumpectomy on Oct 22, 2014. I am thinking she will be taking Arimidex. Recent DEXA shows osteoporosis; I was seeking your thoughts regarding medication choice to treat this problem. Ms. Comi is taking low dose Vitamin D but not calcium, if I understand her correctly.   I would really appreciate your input for a recommendation about what medication she should take to treat the bone disease.   Thank you and your team for your excellent care of our mutual patients.

## 2014-11-03 ENCOUNTER — Encounter (HOSPITAL_COMMUNITY): Payer: Self-pay | Admitting: *Deleted

## 2014-11-03 MED ORDER — CEFAZOLIN SODIUM-DEXTROSE 2-3 GM-% IV SOLR
2.0000 g | INTRAVENOUS | Status: AC
  Administered 2014-11-04: 2 g via INTRAVENOUS
  Filled 2014-11-03: qty 50

## 2014-11-03 NOTE — Progress Notes (Signed)
Pt denies SOB, chest pain, and being under the care of a cardiologist. Pt stated that a stress test was completed more than 6 years ago and denies having a cardiac cath. Pt made aware to stop taking Aspirin, otc vitamins and herbal medications ( Q 10, fish oil and garlic). Do not take any NSAIDs ie: Ibuprofen, Advil, Naproxen or any medication containing Aspirin. Pt verbalized understanding of all pre-op instructions.

## 2014-11-04 ENCOUNTER — Encounter (HOSPITAL_COMMUNITY)
Admission: RE | Disposition: A | Discharge status: Home / Self Care | Payer: Self-pay | Source: Ambulatory Visit | Attending: General Surgery

## 2014-11-04 ENCOUNTER — Ambulatory Visit (HOSPITAL_COMMUNITY): Payer: Medicare Other | Admitting: Anesthesiology

## 2014-11-04 ENCOUNTER — Ambulatory Visit (HOSPITAL_COMMUNITY)
Admission: RE | Admit: 2014-11-04 | Discharge: 2014-11-04 | Disposition: A | Discharge status: Home / Self Care | Payer: Medicare Other | Source: Ambulatory Visit | Attending: General Surgery | Admitting: General Surgery

## 2014-11-04 DIAGNOSIS — I10 Essential (primary) hypertension: Secondary | ICD-10-CM | POA: Diagnosis not present

## 2014-11-04 DIAGNOSIS — Z87442 Personal history of urinary calculi: Secondary | ICD-10-CM | POA: Diagnosis not present

## 2014-11-04 DIAGNOSIS — E780 Pure hypercholesterolemia: Secondary | ICD-10-CM | POA: Insufficient documentation

## 2014-11-04 DIAGNOSIS — K219 Gastro-esophageal reflux disease without esophagitis: Secondary | ICD-10-CM | POA: Insufficient documentation

## 2014-11-04 DIAGNOSIS — Z7982 Long term (current) use of aspirin: Secondary | ICD-10-CM | POA: Diagnosis not present

## 2014-11-04 DIAGNOSIS — C50912 Malignant neoplasm of unspecified site of left female breast: Secondary | ICD-10-CM | POA: Diagnosis not present

## 2014-11-04 DIAGNOSIS — K649 Unspecified hemorrhoids: Secondary | ICD-10-CM | POA: Insufficient documentation

## 2014-11-04 DIAGNOSIS — Z8711 Personal history of peptic ulcer disease: Secondary | ICD-10-CM | POA: Insufficient documentation

## 2014-11-04 HISTORY — PX: RE-EXCISION OF BREAST CANCER,SUPERIOR MARGINS: SHX6047

## 2014-11-04 HISTORY — DX: Other specified postprocedural states: R11.2

## 2014-11-04 HISTORY — DX: Calculus of kidney: N20.0

## 2014-11-04 HISTORY — DX: Other specified postprocedural states: Z98.890

## 2014-11-04 LAB — CBC
HCT: 36.2 % (ref 36.0–46.0)
HEMOGLOBIN: 11.6 g/dL — AB (ref 12.0–15.0)
MCH: 28.8 pg (ref 26.0–34.0)
MCHC: 32 g/dL (ref 30.0–36.0)
MCV: 89.8 fL (ref 78.0–100.0)
Platelets: 220 10*3/uL (ref 150–400)
RBC: 4.03 MIL/uL (ref 3.87–5.11)
RDW: 12.7 % (ref 11.5–15.5)
WBC: 5.6 10*3/uL (ref 4.0–10.5)

## 2014-11-04 LAB — BASIC METABOLIC PANEL
ANION GAP: 9 (ref 5–15)
BUN: 15 mg/dL (ref 6–20)
CHLORIDE: 108 mmol/L (ref 101–111)
CO2: 24 mmol/L (ref 22–32)
Calcium: 9.9 mg/dL (ref 8.9–10.3)
Creatinine, Ser: 0.97 mg/dL (ref 0.44–1.00)
GFR, EST NON AFRICAN AMERICAN: 55 mL/min — AB (ref >60)
GLUCOSE: 103 mg/dL — AB (ref 65–99)
POTASSIUM: 3.5 mmol/L (ref 3.5–5.1)
Sodium: 141 mmol/L (ref 135–145)

## 2014-11-04 SURGERY — RE-EXCISION OF BREAST CANCER,SUPERIOR MARGINS
Anesthesia: General | Site: Breast | Laterality: Left

## 2014-11-04 MED ORDER — PROMETHAZINE HCL 25 MG/ML IJ SOLN: 6.2500 mg | INTRAMUSCULAR | Status: DC | PRN

## 2014-11-04 MED ORDER — ONDANSETRON HCL 4 MG/2ML IJ SOLN
INTRAMUSCULAR | Status: DC | PRN
  Administered 2014-11-04: 4 mg via INTRAVENOUS

## 2014-11-04 MED ORDER — LACTATED RINGERS IV SOLN
INTRAVENOUS | Status: DC | PRN
  Administered 2014-11-04 (×2): via INTRAVENOUS

## 2014-11-04 MED ORDER — FENTANYL CITRATE (PF) 250 MCG/5ML IJ SOLN
INTRAMUSCULAR | Status: AC
  Filled 2014-11-04: qty 5

## 2014-11-04 MED ORDER — FENTANYL CITRATE (PF) 100 MCG/2ML IJ SOLN
INTRAMUSCULAR | Status: AC
  Filled 2014-11-04: qty 2

## 2014-11-04 MED ORDER — LIDOCAINE HCL (CARDIAC) 20 MG/ML IV SOLN
INTRAVENOUS | Status: AC
  Filled 2014-11-04: qty 25

## 2014-11-04 MED ORDER — CHLORHEXIDINE GLUCONATE 4 % EX LIQD: 1.0000 "application " | Freq: Once | CUTANEOUS | Status: DC

## 2014-11-04 MED ORDER — KETOROLAC TROMETHAMINE 30 MG/ML IJ SOLN
INTRAMUSCULAR | Status: AC
  Filled 2014-11-04: qty 1

## 2014-11-04 MED ORDER — BUPIVACAINE HCL (PF) 0.25 % IJ SOLN
INTRAMUSCULAR | Status: DC | PRN
  Administered 2014-11-04: 10 mL

## 2014-11-04 MED ORDER — 0.9 % SODIUM CHLORIDE (POUR BTL) OPTIME
TOPICAL | Status: DC | PRN
  Administered 2014-11-04: 1000 mL

## 2014-11-04 MED ORDER — DEXAMETHASONE SODIUM PHOSPHATE 4 MG/ML IJ SOLN
INTRAMUSCULAR | Status: DC | PRN
  Administered 2014-11-04: 4 mg via INTRAVENOUS

## 2014-11-04 MED ORDER — ARTIFICIAL TEARS OP OINT
TOPICAL_OINTMENT | OPHTHALMIC | Status: AC
  Filled 2014-11-04: qty 3.5

## 2014-11-04 MED ORDER — DEXAMETHASONE SODIUM PHOSPHATE 4 MG/ML IJ SOLN
INTRAMUSCULAR | Status: AC
  Filled 2014-11-04: qty 1

## 2014-11-04 MED ORDER — KETOROLAC TROMETHAMINE 30 MG/ML IJ SOLN
30.0000 mg | Freq: Once | INTRAMUSCULAR | Status: AC | PRN
  Administered 2014-11-04: 30 mg via INTRAVENOUS

## 2014-11-04 MED ORDER — ROCURONIUM BROMIDE 50 MG/5ML IV SOLN
INTRAVENOUS | Status: AC
  Filled 2014-11-04: qty 1

## 2014-11-04 MED ORDER — BUPIVACAINE HCL (PF) 0.25 % IJ SOLN
INTRAMUSCULAR | Status: AC
  Filled 2014-11-04: qty 30

## 2014-11-04 MED ORDER — LIDOCAINE HCL (CARDIAC) 20 MG/ML IV SOLN
INTRAVENOUS | Status: AC
  Filled 2014-11-04: qty 5

## 2014-11-04 MED ORDER — FENTANYL CITRATE (PF) 100 MCG/2ML IJ SOLN
INTRAMUSCULAR | Status: DC | PRN
  Administered 2014-11-04 (×2): 50 ug via INTRAVENOUS
  Administered 2014-11-04: 25 ug via INTRAVENOUS

## 2014-11-04 MED ORDER — ARTIFICIAL TEARS OP OINT
TOPICAL_OINTMENT | OPHTHALMIC | Status: DC | PRN
  Administered 2014-11-04: 1 via OPHTHALMIC

## 2014-11-04 MED ORDER — MIDAZOLAM HCL 2 MG/2ML IJ SOLN
INTRAMUSCULAR | Status: AC
  Filled 2014-11-04: qty 2

## 2014-11-04 MED ORDER — PHENYLEPHRINE 40 MCG/ML (10ML) SYRINGE FOR IV PUSH (FOR BLOOD PRESSURE SUPPORT)
PREFILLED_SYRINGE | INTRAVENOUS | Status: AC
  Filled 2014-11-04: qty 20

## 2014-11-04 MED ORDER — PROPOFOL 10 MG/ML IV BOLUS
INTRAVENOUS | Status: DC | PRN
  Administered 2014-11-04: 180 mg via INTRAVENOUS

## 2014-11-04 MED ORDER — GLYCOPYRROLATE 0.2 MG/ML IJ SOLN
INTRAMUSCULAR | Status: AC
Start: 2014-11-04 — End: 2014-11-04
  Filled 2014-11-04: qty 3

## 2014-11-04 MED ORDER — ONDANSETRON HCL 4 MG/2ML IJ SOLN
INTRAMUSCULAR | Status: AC
  Filled 2014-11-04: qty 2

## 2014-11-04 MED ORDER — LIDOCAINE HCL 1 % IJ SOLN
INTRAMUSCULAR | Status: DC | PRN
  Administered 2014-11-04: 80 mg via INTRADERMAL

## 2014-11-04 MED ORDER — NEOSTIGMINE METHYLSULFATE 10 MG/10ML IV SOLN
INTRAVENOUS | Status: AC
  Filled 2014-11-04: qty 1

## 2014-11-04 MED ORDER — PROPOFOL 10 MG/ML IV BOLUS
INTRAVENOUS | Status: AC
  Filled 2014-11-04: qty 20

## 2014-11-04 MED ORDER — FENTANYL CITRATE (PF) 100 MCG/2ML IJ SOLN
25.0000 ug | INTRAMUSCULAR | Status: DC | PRN
  Administered 2014-11-04: 25 ug via INTRAVENOUS

## 2014-11-04 MED ORDER — PHENYLEPHRINE HCL 10 MG/ML IJ SOLN
INTRAMUSCULAR | Status: DC | PRN
  Administered 2014-11-04 (×2): 80 ug via INTRAVENOUS

## 2014-11-04 SURGICAL SUPPLY — 41 items
ADH SKN CLS APL DERMABOND .7 (GAUZE/BANDAGES/DRESSINGS) ×1
APPLIER CLIP 9.375 MED OPEN (MISCELLANEOUS) ×3
APR CLP MED 9.3 20 MLT OPN (MISCELLANEOUS) ×1
BINDER BREAST LRG (GAUZE/BANDAGES/DRESSINGS) IMPLANT
BINDER BREAST XLRG (GAUZE/BANDAGES/DRESSINGS) IMPLANT
BLADE SURG 15 STRL LF DISP TIS (BLADE) ×1 IMPLANT
BLADE SURG 15 STRL SS (BLADE) ×3
CHLORAPREP W/TINT 26ML (MISCELLANEOUS) ×3 IMPLANT
CLIP APPLIE 9.375 MED OPEN (MISCELLANEOUS) IMPLANT
CONT SPEC 4OZ CLIKSEAL STRL BL (MISCELLANEOUS) ×2 IMPLANT
COVER SURGICAL LIGHT HANDLE (MISCELLANEOUS) ×3 IMPLANT
DERMABOND ADVANCED (GAUZE/BANDAGES/DRESSINGS) ×2
DERMABOND ADVANCED .7 DNX12 (GAUZE/BANDAGES/DRESSINGS) IMPLANT
DRAPE CHEST BREAST 15X10 FENES (DRAPES) ×3 IMPLANT
DRAPE UTILITY XL STRL (DRAPES) ×3 IMPLANT
ELECT COATED BLADE 2.86 ST (ELECTRODE) ×3 IMPLANT
ELECT REM PT RETURN 9FT ADLT (ELECTROSURGICAL) ×3
ELECTRODE REM PT RTRN 9FT ADLT (ELECTROSURGICAL) ×1 IMPLANT
GLOVE BIO SURGEON STRL SZ7.5 (GLOVE) ×3 IMPLANT
GOWN STRL REUS W/ TWL LRG LVL3 (GOWN DISPOSABLE) ×2 IMPLANT
GOWN STRL REUS W/TWL LRG LVL3 (GOWN DISPOSABLE) ×6
KIT BASIN OR (CUSTOM PROCEDURE TRAY) ×3 IMPLANT
KIT ROOM TURNOVER OR (KITS) ×3 IMPLANT
LIQUID BAND (GAUZE/BANDAGES/DRESSINGS) ×1 IMPLANT
NDL HYPO 25GX1X1/2 BEV (NEEDLE) ×1 IMPLANT
NEEDLE HYPO 25GX1X1/2 BEV (NEEDLE) ×3 IMPLANT
NS IRRIG 1000ML POUR BTL (IV SOLUTION) ×3 IMPLANT
PACK SURGICAL SETUP 50X90 (CUSTOM PROCEDURE TRAY) ×3 IMPLANT
PAD ARMBOARD 7.5X6 YLW CONV (MISCELLANEOUS) ×3 IMPLANT
PENCIL BUTTON HOLSTER BLD 10FT (ELECTRODE) ×3 IMPLANT
SOL PREP POV-IOD 4OZ 10% (MISCELLANEOUS) ×2 IMPLANT
SPONGE LAP 18X18 X RAY DECT (DISPOSABLE) ×3 IMPLANT
SUT MNCRL AB 4-0 PS2 18 (SUTURE) ×3 IMPLANT
SUT VIC AB 3-0 SH 18 (SUTURE) ×3 IMPLANT
SYR BULB 3OZ (MISCELLANEOUS) ×3 IMPLANT
SYR CONTROL 10ML LL (SYRINGE) ×3 IMPLANT
TOWEL OR 17X24 6PK STRL BLUE (TOWEL DISPOSABLE) ×3 IMPLANT
TOWEL OR 17X26 10 PK STRL BLUE (TOWEL DISPOSABLE) ×3 IMPLANT
TUBE CONNECTING 12'X1/4 (SUCTIONS) ×1
TUBE CONNECTING 12X1/4 (SUCTIONS) ×2 IMPLANT
YANKAUER SUCT BULB TIP NO VENT (SUCTIONS) ×3 IMPLANT

## 2014-11-04 NOTE — Op Note (Signed)
11/04/2014  8:29 AM  PATIENT:  [name]  80 y.o. female  PRE-OPERATIVE DIAGNOSIS:  Left Breast Cancer  POST-OPERATIVE DIAGNOSIS:  Left Breast Cancer  PROCEDURE:  Procedure(s): RE-EXCISION OF LEFT BREAST INFERIOR MARGINS (Left)  SURGEON:  Surgeon(s) and Role:    * Jovita Kussmaul, MD - Primary  PHYSICIAN ASSISTANT:   ASSISTANTS: none   ANESTHESIA:   general  EBL:  Total I/O In: 1000 [I.V.:1000] Out: -   BLOOD ADMINISTERED:none  DRAINS: none   LOCAL MEDICATIONS USED:  MARCAINE     SPECIMEN:  Source of Specimen:  left breast inferior margin  DISPOSITION OF SPECIMEN:  PATHOLOGY  COUNTS:  YES  TOURNIQUET:  * No tourniquets in log *  DICTATION: .Dragon Dictation  After informed consent was obtained the patient was brought to the operating room and placed in the supine position on the operating room table. After adequate induction of general anesthesia the patient's left breast was prepped with ChloraPrep, allowed to dry, and draped in usual sterile manner. The area around the old incision was infiltrated with quarter percent Marcaine. The incision was then reopened with a 15 blade knife until the seroma cavity was encountered. There was minimal fluid. This was evacuated. The inferior margin was excised sharply with electrocautery. The margin was then marked with a short double stitch on the new true inferior margin and a long double stitch on the lateral edge of the specimen. The specimen was then sent to pathology for further evaluation. Hemostasis was achieved using the Bovie electrocautery. The inferior margin was remarked with a clip in the cavity. The deep layer of the wound was then closed with interrupted 3-0 Vicryl stitches. The skin was then closed with interrupted 4-0 Monocryl subcuticular stitches. Dermabond dressings were applied. The patient tolerated the procedure well. The end of the case all needle sponge and instrument counts were correct. The patient was then  awakened and taken to recovery in stable condition.  PLAN OF CARE: Discharge to home after PACU  PATIENT DISPOSITION:  PACU - hemodynamically stable.   Delay start of Pharmacological VTE agent (>24hrs) due to surgical blood loss or risk of bleeding: not applicable

## 2014-11-04 NOTE — Transfer of Care (Signed)
Immediate Anesthesia Transfer of Care Note  Patient: [name]  Procedure(s) Performed: Procedure(s): RE-EXCISION OF LEFT BREAST INFERIOR MARGINS (Left)  Patient Location: PACU  Anesthesia Type:General  Level of Consciousness: awake, patient cooperative and lethargic  Airway & Oxygen Therapy: Patient Spontanous Breathing and Patient connected to nasal cannula oxygen  Post-op Assessment: Report given to RN, Post -op Vital signs reviewed and stable and Patient moving all extremities  Post vital signs: Reviewed and stable  Complications: No apparent anesthesia complications

## 2014-11-04 NOTE — H&P (View-Only) (Signed)
Coleen T. Gravelle 10/13/2014 10:16 AM Location: [facility] Patient #: [id number] DOB: [birth year] Undefined / Language: Suszanne Conners / Race: Undefined Female  History of Present Illness Sammuel Hines. Marlou Starks MD; 10/13/2014 4:23 PM) The patient is a 80 year old female who presents with breast cancer. We're asked to see the patient in consultation by Dr. Luberta Robertson to evaluate her for a left-sided breast cancer. The patient is a 80 year old black female who recently went for a annual physical. At that time her medical doctor found a lump in the lateral aspect of her left breast. She denies any breast pain or discharge from the nipple. She states that it has been 8-10 years since her last mammogram. The mass measured 2 cm by ultrasound. It was biopsied and came back as an invasive ductal cancer. It was ER and PR positive and HER-2 negative with a Ki-67 of 5%. She does not take any hormone replacement.   Other Problems Anderson Malta [place], RMA; 10/13/2014 10:16 AM) Breast Cancer Gastric Ulcer Gastroesophageal Reflux Disease Hemorrhoids High blood pressure Hypercholesterolemia Kidney Stone Lump In Breast Transfusion history  Past Surgical History Anderson Malta [place], RMA; 10/13/2014 10:16 AM) Breast Biopsy Bilateral. Oral Surgery  Diagnostic Studies History Anderson Malta [place], [place]; 10/13/2014 10:16 AM) Colonoscopy 5-10 years ago Mammogram within last year Pap Smear >5 years ago  Social History Anderson Malta [place], RMA; 10/13/2014 10:16 AM) Caffeine use Tea. No alcohol use No drug use Tobacco use Never smoker.  Family History Anderson Malta [place], [place]; 10/13/2014 10:16 AM) Cerebrovascular Accident Father. Diabetes Mellitus Sister. Heart Disease Father. Hypertension Mother. Malignant Neoplasm Of Pancreas Mother. Migraine Headache Mother.  Pregnancy / Birth History Anderson Malta [place], [place]; 10/13/2014 10:16 AM) Age at menarche 31 years. Age of menopause 6-50 Contraceptive  History Oral contraceptives. Gravida 5 Irregular periods Maternal age 34-25 Para 4  Review of Systems Anderson Malta [place] RMA; 10/13/2014 10:16 AM) General Not Present- Appetite Loss, Chills, Fatigue, Fever, Night Sweats, Weight Gain and Weight Loss. Skin Not Present- Change in Wart/Mole, Dryness, Hives, Jaundice, New Lesions, Non-Healing Wounds, Rash and Ulcer. HEENT Present- Ringing in the Ears and Wears glasses/contact lenses. Not Present- Earache, Hearing Loss, Hoarseness, Nose Bleed, Oral Ulcers, Seasonal Allergies, Sinus Pain, Sore Throat, Visual Disturbances and Yellow Eyes. Respiratory Not Present- Bloody sputum, Chronic Cough, Difficulty Breathing, Snoring and Wheezing. Breast Present- Breast Mass. Not Present- Breast Pain, Nipple Discharge and Skin Changes. Cardiovascular Not Present- Chest Pain, Difficulty Breathing Lying Down, Leg Cramps, Palpitations, Rapid Heart Rate, Shortness of Breath and Swelling of Extremities. Gastrointestinal Not Present- Abdominal Pain, Bloating, Bloody Stool, Change in Bowel Habits, Chronic diarrhea, Constipation, Difficulty Swallowing, Excessive gas, Gets full quickly at meals, Hemorrhoids, Indigestion, Nausea, Rectal Pain and Vomiting. Female Genitourinary Not Present- Frequency, Nocturia, Painful Urination, Pelvic Pain and Urgency. Musculoskeletal Not Present- Back Pain, Joint Pain, Joint Stiffness, Muscle Pain, Muscle Weakness and Swelling of Extremities. Neurological Not Present- Decreased Memory, Fainting, Headaches, Numbness, Seizures, Tingling, Tremor, Trouble walking and Weakness. Psychiatric Not Present- Anxiety, Bipolar, Change in Sleep Pattern, Depression, Fearful and Frequent crying. Endocrine Not Present- Cold Intolerance, Excessive Hunger, Hair Changes, Heat Intolerance, Hot flashes and New Diabetes. Hematology Not Present- Easy Bruising, Excessive bleeding, Gland problems, HIV and Persistent Infections.   Physical Exam Eddie Dibbles S. Marlou Starks MD;  10/13/2014 4:23 PM) General Mental Status-Alert. General Appearance-Consistent with stated age. Hydration-Well hydrated. Voice-Normal.  Head and Neck Head-normocephalic, atraumatic with no lesions or palpable masses. Trachea-midline. Thyroid Gland Characteristics - normal size and consistency.  Eye Eyeball - Bilateral-Extraocular movements intact. Sclera/Conjunctiva -  Bilateral-No scleral icterus.  Chest and Lung Exam Chest and lung exam reveals -quiet, even and easy respiratory effort with no use of accessory muscles and on auscultation, normal breath sounds, no adventitious sounds and normal vocal resonance. Inspection Chest Wall - Normal. Back - normal.  Breast Note: There is a palpable 3 cm mass in the 3:00 position of the left breast. There is no palpable mass in the right breast. There is no palpable axillary, supraclavicular, or cervical lymphadenopathy.   Cardiovascular Cardiovascular examination reveals -normal heart sounds, regular rate and rhythm with no murmurs and normal pedal pulses bilaterally.  Abdomen Inspection Inspection of the abdomen reveals - No Hernias. Skin - Scar - no surgical scars. Palpation/Percussion Palpation and Percussion of the abdomen reveal - Soft, Non Tender, No Rebound tenderness, No Rigidity (guarding) and No hepatosplenomegaly. Auscultation Auscultation of the abdomen reveals - Bowel sounds normal.  Neurologic Neurologic evaluation reveals -alert and oriented x 3 with no impairment of recent or remote memory. Mental Status-Normal.  Musculoskeletal Normal Exam - Left-Upper Extremity Strength Normal and Lower Extremity Strength Normal. Normal Exam - Right-Upper Extremity Strength Normal and Lower Extremity Strength Normal.  Lymphatic Head & Neck  General Head & Neck Lymphatics: Bilateral - Description - Normal. Axillary  General Axillary Region: Bilateral - Description - Normal. Tenderness - Non  Tender. Femoral & Inguinal  Generalized Femoral & Inguinal Lymphatics: Bilateral - Description - Normal. Tenderness - Non Tender.    Assessment & Plan Eddie Dibbles S. Marlou Starks MD; 10/13/2014 4:25 PM) PRIMARY CANCER OF UPPER OUTER QUADRANT OF LEFT FEMALE BREAST (174.4  C50.412) Impression: The patient appears to have a 2 cm cancer in the outer aspect of the left breast. I have talked to her in detail about the different options for treatment and at this point she favors breast conservation. I think this is a very reasonable way to treat her cancer. Since her nodes are clinically negative she is also a good candidate for sentinel node mapping. I have discussed with her in detail the risks and benefits of the operation to do this as well as some of the technical aspects and she understands and wishes to proceed     Signed by Luella Cook, MD (10/13/2014 4:25 PM)

## 2014-11-04 NOTE — Progress Notes (Signed)
Spoke with Dr Kalman Shan and informed him of pts elevated blood pressure

## 2014-11-04 NOTE — Anesthesia Procedure Notes (Signed)
Procedure Name: LMA Insertion Date/Time: 11/04/2014 7:40 AM Performed by: Willeen Cass P Pre-anesthesia Checklist: Patient identified, Timeout performed, Emergency Drugs available, Suction available and Patient being monitored Patient Re-evaluated:Patient Re-evaluated prior to inductionOxygen Delivery Method: Circle system utilized Preoxygenation: Pre-oxygenation with 100% oxygen Intubation Type: IV induction Ventilation: Mask ventilation without difficulty LMA: LMA inserted LMA Size: 4.0 Number of attempts: 1 Placement Confirmation: breath sounds checked- equal and bilateral and positive ETCO2 Tube secured with: Tape Dental Injury: Teeth and Oropharynx as per pre-operative assessment

## 2014-11-04 NOTE — Anesthesia Preprocedure Evaluation (Signed)
Anesthesia Evaluation  Patient identified by MRN, date of birth, ID band Patient awake    Reviewed: Allergy & Precautions, NPO status , Patient's Chart, lab work & pertinent test results  History of Anesthesia Complications (+) PONV  Airway Mallampati: II  TM Distance: >3 FB Neck ROM: Full    Dental no notable dental hx.    Pulmonary neg pulmonary ROS,  breath sounds clear to auscultation  Pulmonary exam normal       Cardiovascular hypertension, Pt. on medications Normal cardiovascular examRhythm:Regular Rate:Normal     Neuro/Psych negative neurological ROS  negative psych ROS   GI/Hepatic Neg liver ROS, PUD, GERD-  Medicated,  Endo/Other  negative endocrine ROS  Renal/GU negative Renal ROS  negative genitourinary   Musculoskeletal negative musculoskeletal ROS (+)   Abdominal   Peds negative pediatric ROS (+)  Hematology negative hematology ROS (+)   Anesthesia Other Findings   Reproductive/Obstetrics negative OB ROS                             Anesthesia Physical Anesthesia Plan  ASA: III  Anesthesia Plan: General   Post-op Pain Management:    Induction: Intravenous  Airway Management Planned: LMA  Additional Equipment:   Intra-op Plan:   Post-operative Plan: Extubation in OR  Informed Consent: I have reviewed the patients History and Physical, chart, labs and discussed the procedure including the risks, benefits and alternatives for the proposed anesthesia with the patient or authorized representative who has indicated his/her understanding and acceptance.   Dental advisory given  Plan Discussed with: CRNA and Surgeon  Anesthesia Plan Comments:         Anesthesia Quick Evaluation

## 2014-11-04 NOTE — Anesthesia Postprocedure Evaluation (Signed)
  Anesthesia Post-op Note  Patient: [name]  Procedure(s) Performed: Procedure(s) (LRB): RE-EXCISION OF LEFT BREAST INFERIOR MARGINS (Left)  Patient Location: PACU  Anesthesia Type: General  Level of Consciousness: awake and alert   Airway and Oxygen Therapy: Patient Spontanous Breathing  Post-op Pain: mild  Post-op Assessment: Post-op Vital signs reviewed, Patient's Cardiovascular Status Stable, Respiratory Function Stable, Patent Airway and No signs of Nausea or vomiting  Last Vitals:  Filed Vitals:   11/04/14 0909  BP: 168/77  Pulse: 57  Temp:   Resp: 11    Post-op Vital Signs: stable   Complications: No apparent anesthesia complications

## 2014-11-04 NOTE — Interval H&P Note (Signed)
History and Physical Interval Note:  11/04/2014 7:01 AM  [name]  has presented today for surgery, with the diagnosis of Left Breast Cancer  The various methods of treatment have been discussed with the patient and family. After consideration of risks, benefits and other options for treatment, the patient has consented to  Procedure(s): RE-EXCISION OF LEFT BREAST INFERIOR MARGINS (Left) as a surgical intervention .  The patient's history has been reviewed, patient examined, no change in status, stable for surgery.  I have reviewed the patient's chart and labs.  Questions were answered to the patient's satisfaction.     TOTH III,Billyjack Trompeter S

## 2014-11-05 ENCOUNTER — Encounter (HOSPITAL_COMMUNITY): Payer: Self-pay | Admitting: General Surgery

## 2014-11-10 ENCOUNTER — Institutional Professional Consult (permissible substitution): Payer: Self-pay | Admitting: Radiation Oncology

## 2014-11-29 ENCOUNTER — Other Ambulatory Visit: Payer: Self-pay

## 2014-11-29 NOTE — Progress Notes (Addendum)
Location of Breast Cancer:left Breast  Histology per Pathology Report:10/22/14 Diagnosis 1. Lymph node, sentinel, biopsy, left axillary - ONE LYMPH NODE, NEGATIVE FOR TUMOR (0/1). 2. Breast, lumpectomy, left - INVASIVE MAMMARY CARCINOMA, MIXED MUCINOUS AND DUCTAL VARIANT, SEE COMMENT. - NEGATIVE FOR LYMPH VASCULAR INVASION. - TUMOR BROADLY INVOLVES THE SUPERIOR MARGIN (FINAL MARGIN PART 3). - TUMOR BROADLY INVOLVES THE MEDIAL MARGIN (FINAL MARGIN PART 4). - TUMOR BROADLY INVOLVES THE INFERIOR MARGIN - PREVIOUS BIOPSY SITE. - SEE TUMOR SYNOPTIC TEMPLATE BELOW. 3. Breast, excision, left superior margin - BENIGN BREAST TISSUE, SEE COMMENT. - NEGATIVE FOR ATYPIA OR MALIGNANCY. - SURGICAL MARGIN, NEGATIVE FOR ATYPIA OR MALIGNANCY. 4. Breast, excision, left additional medial margin - BENIGN BREAST TISSUE. - NEGATIVE FOR ATYPIA OR MALIGNANCY. - SURGICAL MARGIN, NEGATIVE FOR ATYPIA OR MALIGNANCY. - SEE COMMENT.  Receptor Status: ER(90%), PR (90%), Her2-neu (-), Ki-67(5%)  Did patient present with symptoms (if so, please note symptoms) or was this found on screening mammography?:   Past/Anticipated interventions by surgeon, if any:11/04/14 RE-EXCISION OF BREAST CANCER,SUPERIOR MARGINS  Past/Anticipated interventions by medical oncology, if any: Chemotherapy   Lymphedema issues, if any: None  Pain issues, if any: None   SAFETY ISSUES:  Prior radiation? No  Pacemaker/ICD? No  Possible current pregnancy? No  Is the patient on methotrexate? NO  Current Complaints / other details:   Menses age 6, G5, P4,BC x 2-3 years, Menopause age 27 or 80 years old, HRT~ 3years    [name], Mary Drown, RN 11/29/2014,10:14 AM

## 2014-12-01 ENCOUNTER — Encounter: Payer: Self-pay | Admitting: Radiation Oncology

## 2014-12-01 ENCOUNTER — Ambulatory Visit
Admission: RE | Admit: 2014-12-01 | Discharge: 2014-12-01 | Disposition: A | Discharge status: Home / Self Care | Payer: Medicare Other | Source: Ambulatory Visit | Attending: Radiation Oncology | Admitting: Radiation Oncology

## 2014-12-01 VITALS — BP 177/79 | HR 74 | Temp 97.9°F | Ht 68.0 in | Wt 184.3 lb

## 2014-12-01 DIAGNOSIS — C50512 Malignant neoplasm of lower-outer quadrant of left female breast: Secondary | ICD-10-CM | POA: Diagnosis present

## 2014-12-01 NOTE — Progress Notes (Addendum)
Radiation Oncology         (336) [phone] ________________________________  Name: [name] MRN: [id number]  Date: 12/01/2014  DOB: [birth year]  Follow-Up Visit Note  Outpatient  CC: Dow Adolph, MD  Griselda Miner, MD  Diagnosis and Prior Radiotherapy:    ICD-9-CM ICD-10-CM   1. Breast cancer of lower-outer quadrant of left female breast 174.5 C50.512     Left breast stage I, pT1c N0 clinical M0 invasive mammary carcinoma ER 90% PR 90% Her-2 (-)  Narrative:  The patient returns today for routine follow-up.  She underwent lumpectomy on 10/04/14 with sentinel lymph node biopsy. This revealed a grade 2 1.8 cm tumor and one negative node. Her margins were positive so she underwent re-excision on 11/04/14 and no residual tumor was identified. She denies any pain or lymphedema. She has an appointment on July 6th with medical oncology, Dr. Darnelle Catalan.   Discussed radiotherapy benefits and potential side effects.   She was curious whether her insurance, Medicare with [employer], would cover the cost. She would like to speak with a Artist.                          ALLERGIES:  has No Known Allergies.  Meds: Current Outpatient Prescriptions  Medication Sig Dispense Refill  . Ascorbic Acid (VITAMIN C) 1000 MG tablet Take 1,000 mg by mouth daily.    Marland Kitchen aspirin 81 MG tablet Take 81 mg by mouth 3 (three) times a week.     . cholecalciferol (VITAMIN D) 1000 UNITS tablet Take 1,000 Units by mouth daily. Over the counter    . co-enzyme Q-10 30 MG capsule Take 30 mg by mouth 3 (three) times daily.    . fish oil-omega-3 fatty acids 1000 MG capsule Take 1 g by mouth daily.    Marland Kitchen GARLIC PO Take by mouth daily.    Marland Kitchen losartan (COZAAR) 50 MG tablet Take 1 tablet (50 mg total) by mouth daily. 30 tablet 5  . Multiple Vitamin (MULTIVITAMIN) tablet Take 1 tablet by mouth daily.     No current facility-administered medications for this encounter.    Physical Findings: The patient is in no  acute distress. Patient is alert and oriented.  height is 5\' 8"  (1.727 m) and weight is 184 lb 4.8 oz (83.598 kg). Her temperature is 97.9 F (36.6 C). Her blood pressure is 177/79 and her pulse is 74. .    Skin: see Breast Exam Musculoskeletal: good range of motion in left arm Breast: Lateral left breast scar with significant tissue deficit from partial mastectomy with no active bleeding or drainage.   Lab Findings: Lab Results  Component Value Date   WBC 5.6 11/04/2014   HGB 11.6* 11/04/2014   HCT 36.2 11/04/2014   MCV 89.8 11/04/2014   PLT 220 11/04/2014    Radiographic Findings: No results found.  Impression/Plan:  80 yo with ER+ Stage T1cN0 breast cancer.  We discussed the 78 al NEJM data re: RT vs RT+ antiestrogen therapy for adjuvant treatment in patients similar to herself.  I recommended radiotherapy for her given her good performance status, good life expectancy, and tumor size close to 2cm. Radiotherapy would not improve her life expectancy, which should not be compromised by her cancer.  RT would reduce her risk of local recurrence in the breast from about 10 to 2% in the next decade.  Discussed simulation planning day and radiation treatment, with 16 treatments total.  She would like some time to decide whether she'd like to begin radiotherapy.   Given contact information to call back with her decision on whether she'd like to receive radiotherapy or not. She plans to call back by Friday, 7/1.  Referral to a financial counselor to discuss insurance questions.   _____________________________________   Eppie Gibson, MD

## 2014-12-02 ENCOUNTER — Telehealth: Payer: Self-pay | Admitting: *Deleted

## 2014-12-02 NOTE — Telephone Encounter (Signed)
Called pt to make her aware of Dr. Virgie Dad schedule change and due to her not needing chemo that Dr. Doris Cheadle would wait to see her after radiation.  She has more questions about Radiation and did not want to move the appt w/ Dr. [name] yet.  She requested for me to call her tomorrow about it.  I transferred her to [place] so the questions could be addressed.

## 2014-12-02 NOTE — Telephone Encounter (Signed)
Patient called and asked to speak with Dr. Isidore Moos  , her question is "is the radiation supposed to cover  The rest of the cancer 100% "she hasn't decided yet whether to go ahead with radiation, informed her Dr. Isidore Moos was out of the office but I would in basket her ashe will be back in the office tomorrow, patient stated she would call back tomorrow and speak with her then 4:17 PM'

## 2014-12-03 ENCOUNTER — Encounter: Payer: Self-pay | Admitting: *Deleted

## 2014-12-03 ENCOUNTER — Telehealth: Payer: Self-pay | Admitting: Oncology

## 2014-12-03 NOTE — Telephone Encounter (Signed)
Confirmed appointment for September. Mailed calendar

## 2014-12-03 NOTE — Progress Notes (Signed)
Spoke to pt concerning Dr. [name] appt and that he will see her after xrt has completed Pt wishes to have xrt. Dr Isidore Moos has been notified. Denies further questions or concerns. Encourage pt to call with needs. Received verbal understanding.

## 2014-12-08 ENCOUNTER — Ambulatory Visit: Payer: Medicare Other | Admitting: Oncology

## 2014-12-13 ENCOUNTER — Ambulatory Visit
Admission: RE | Admit: 2014-12-13 | Discharge: 2014-12-13 | Disposition: A | Discharge status: Home / Self Care | Payer: Medicare Other | Source: Ambulatory Visit | Attending: Radiation Oncology | Admitting: Radiation Oncology

## 2014-12-13 DIAGNOSIS — C50512 Malignant neoplasm of lower-outer quadrant of left female breast: Secondary | ICD-10-CM

## 2014-12-13 NOTE — Progress Notes (Signed)
  Radiation Oncology         [phone]) [phone] ________________________________  Name: [name] MRN: [id number]  Date: 12/13/2014  DOB: 1935/01/08  SIMULATION AND TREATMENT PLANNING NOTE    outpatient  DIAGNOSIS:     ICD-9-CM ICD-10-CM   1. Breast cancer of lower-outer quadrant of left female breast 174.5 C50.512     NARRATIVE:  The patient was brought to the [facility].  Identity was confirmed.  All relevant records and images related to the planned course of therapy were reviewed.  The patient freely provided informed written consent to proceed with treatment after reviewing the details related to the planned course of therapy. The consent form was witnessed and verified by the simulation staff.    Then, the patient was set-up in a stable reproducible supine position for radiation therapy with her ipsilateral arm over her head, and her upper body secured in a custom-made Vac-lok device.  CT images were obtained.  Surface markings were placed.  The CT images were loaded into the planning software.    TREATMENT PLANNING NOTE: Treatment planning then occurred.  The radiation prescription was entered and confirmed.     A total of 3 medically necessary complex treatment devices were fabricated and supervised by me: 2 fields with MLCs for custom blocks to protect heart, and lungs;  and, a Vac-lok. I have requested : 3D Simulation  I have requested a DVH of the following structures: lungs, heart, lumpectomy cavity.    The patient will receive 42.56 Gy in 16 fractions to the Left breast with 2 tangential fields.    This will not be followed by a boost.  Optical Surface Tracking Plan:  Since intensity modulated radiotherapy (IMRT) and 3D conformal radiation treatment methods are predicated on accurate and precise positioning for treatment, intrafraction motion monitoring is medically necessary to ensure accurate and safe treatment delivery. The ability to quantify intrafraction  motion without excessive ionizing radiation dose can only be performed with optical surface tracking. Accordingly, surface imaging offers the opportunity to obtain 3D measurements of patient position throughout IMRT and 3D treatments without excessive radiation exposure. I am ordering optical surface tracking for this patient's upcoming course of radiotherapy.  ________________________________   Reference:  Ursula Alert, J, et al. Surface imaging-based analysis of intrafraction motion for breast radiotherapy patients.Journal of [facility], n. 6, nov. 2014. ISSN [id number].  Available at: <http://www.jacmp.org/index.php/jacmp/article/view/4957>.    -----------------------------------  Eppie Gibson, MD

## 2014-12-17 DIAGNOSIS — C50512 Malignant neoplasm of lower-outer quadrant of left female breast: Secondary | ICD-10-CM | POA: Diagnosis not present

## 2014-12-20 ENCOUNTER — Ambulatory Visit
Admission: RE | Admit: 2014-12-20 | Discharge: 2014-12-20 | Disposition: A | Discharge status: Home / Self Care | Payer: Medicare Other | Source: Ambulatory Visit | Attending: Radiation Oncology | Admitting: Radiation Oncology

## 2014-12-20 DIAGNOSIS — C50512 Malignant neoplasm of lower-outer quadrant of left female breast: Secondary | ICD-10-CM | POA: Diagnosis not present

## 2014-12-21 ENCOUNTER — Ambulatory Visit
Admission: RE | Admit: 2014-12-21 | Discharge: 2014-12-21 | Disposition: A | Discharge status: Home / Self Care | Payer: Medicare Other | Source: Ambulatory Visit | Attending: Radiation Oncology | Admitting: Radiation Oncology

## 2014-12-21 ENCOUNTER — Encounter: Payer: Self-pay | Admitting: *Deleted

## 2014-12-21 DIAGNOSIS — C50512 Malignant neoplasm of lower-outer quadrant of left female breast: Secondary | ICD-10-CM | POA: Diagnosis not present

## 2014-12-22 ENCOUNTER — Ambulatory Visit
Admission: RE | Admit: 2014-12-22 | Discharge: 2014-12-22 | Disposition: A | Discharge status: Home / Self Care | Payer: Medicare Other | Source: Ambulatory Visit | Attending: Radiation Oncology | Admitting: Radiation Oncology

## 2014-12-22 DIAGNOSIS — C50512 Malignant neoplasm of lower-outer quadrant of left female breast: Secondary | ICD-10-CM | POA: Diagnosis not present

## 2014-12-23 ENCOUNTER — Ambulatory Visit
Admission: RE | Admit: 2014-12-23 | Discharge: 2014-12-23 | Disposition: A | Discharge status: Home / Self Care | Payer: Medicare Other | Source: Ambulatory Visit | Attending: Radiation Oncology | Admitting: Radiation Oncology

## 2014-12-23 DIAGNOSIS — C50512 Malignant neoplasm of lower-outer quadrant of left female breast: Secondary | ICD-10-CM | POA: Diagnosis not present

## 2014-12-24 ENCOUNTER — Ambulatory Visit
Admission: RE | Admit: 2014-12-24 | Discharge: 2014-12-24 | Disposition: A | Discharge status: Home / Self Care | Payer: Medicare Other | Source: Ambulatory Visit | Attending: Radiation Oncology | Admitting: Radiation Oncology

## 2014-12-24 DIAGNOSIS — C50512 Malignant neoplasm of lower-outer quadrant of left female breast: Secondary | ICD-10-CM | POA: Diagnosis not present

## 2014-12-27 ENCOUNTER — Ambulatory Visit
Admission: RE | Admit: 2014-12-27 | Discharge: 2014-12-27 | Disposition: A | Discharge status: Home / Self Care | Payer: Medicare Other | Source: Ambulatory Visit | Attending: Radiation Oncology | Admitting: Radiation Oncology

## 2014-12-27 VITALS — BP 197/72 | HR 76 | Temp 97.6°F | Resp 12 | Wt 187.9 lb

## 2014-12-27 DIAGNOSIS — C50512 Malignant neoplasm of lower-outer quadrant of left female breast: Secondary | ICD-10-CM | POA: Diagnosis present

## 2014-12-27 MED ORDER — ALRA NON-METALLIC DEODORANT (RAD-ONC)
1.0000 "application " | Freq: Once | TOPICAL | Status: AC
  Administered 2014-12-27: 1 via TOPICAL

## 2014-12-27 MED ORDER — RADIAPLEXRX EX GEL
Freq: Once | CUTANEOUS | Status: AC
  Administered 2014-12-27: 17:00:00 via TOPICAL

## 2014-12-27 NOTE — Progress Notes (Signed)
Weekly Management Note:  Site: left breast Current Dose:   1330  cGy Projected Dose:  4256  cGy  Narrative: The patient is seen today for routine under treatment assessment. CBCT/MVCT images/port films were reviewed. The chart was reviewed.    She is without complaints today. She will undergo patient education today and begin her Radioplex gel.  Physical Examination:  Filed Vitals:   12/27/14 1605  BP: 197/72  Pulse: 76  Temp: 97.6 F (36.4 C)  Resp: 12  .  Weight: 187 lb 14.4 oz (85.231 kg).  No significant skin changes.  Impression: Tolerating radiation therapy well.  Plan: Continue radiation therapy as planned.

## 2014-12-27 NOTE — Progress Notes (Signed)
PAIN: She is currently in no pain.  SKIN: Pt left breast- positive for Dryness along surgical line.  Pt denies edema.   OTHER: Pt complains of fatigue. Pt for education following PUT with DR.  BP 197/72 mmHg  Pulse 76  Temp(Src) 97.6 F (36.4 C) (Oral)  Resp 12  Wt 187 lb 14.4 oz (85.231 kg)  SpO2 100% Wt Readings from Last 3 Encounters:  12/27/14 187 lb 14.4 oz (85.231 kg)  12/01/14 184 lb 4.8 oz (83.598 kg)  11/04/14 183 lb 4 oz (83.122 kg)

## 2014-12-27 NOTE — Progress Notes (Signed)
Pt here for patient teaching.  Pt given Radiation and You booklet, skin care instructions, Alra deodorant and Radiaplex gel. Reviewed areas of pertinence such as fatigue, hair loss, skin changes, breast tenderness and breast swelling . Pt able to give teach back of to pat skin and use unscented/gentle soap,apply Radiaplex bid, avoid applying anything to skin within 4 hours of treatment, avoid wearing an under wire bra and to use an electric razor if they must shave. Pt demonstrated understanding of information given and will contact nursing with any questions or concerns.

## 2014-12-27 NOTE — Progress Notes (Signed)
RADIATION TREATMENT  SKIN CARE-BREAST    RECOMMENDATIONS: ? Use unscented soap (Dove) ? When showering it is fine for water to touch the area, but please avoid direct spray on the treatment field if skin becomes irritated.  Also, wash inside and around the marked area ? When drying gently blot the area  ? PLEASE DO NOT APPLY ANY OTHER CREAMS, LOTIONS, POWDERS, PERFUMES, OILS OR ALCOHOL PRODUCTS (OTHER THAN WHAT IS GIVEN TO YOU BY THE RADIATION TEAM) TO THE TREATMENT AREA DURING RADIATION THERAPY   SKIN CARE: ? Moisturizer o You will be given (Radiaplex Gel) to use. Apply twice daily, once after treatment and then again prior to bedtime o Your Radiation Oncologist may suggest other skin care products as needed  DEODORANT:  ? Nursing will provide deodorant Jethro Poling) to be used during your radiation treatment  Deodorant Alternatives: ? A combination of equal amounts of baking soda and corn starch.  Mix together and powder puff on ? [place] of [place]-available at [employer] ([facility] [place]) ? [place] Crystal Deodorant Mist-100% Natural Mineral Salts and Purified water ([facility])   PLEASE DO NOT APPLY THE RADIAPLEX GEL OR ALRA DEODORANT WITHIN 4 HOURS PRIOR TO RADIATION TREATMENT

## 2014-12-28 ENCOUNTER — Ambulatory Visit
Admission: RE | Admit: 2014-12-28 | Discharge: 2014-12-28 | Disposition: A | Discharge status: Home / Self Care | Payer: Medicare Other | Source: Ambulatory Visit | Attending: Radiation Oncology | Admitting: Radiation Oncology

## 2014-12-28 DIAGNOSIS — C50512 Malignant neoplasm of lower-outer quadrant of left female breast: Secondary | ICD-10-CM | POA: Diagnosis not present

## 2014-12-29 ENCOUNTER — Ambulatory Visit
Admission: RE | Admit: 2014-12-29 | Discharge: 2014-12-29 | Disposition: A | Discharge status: Home / Self Care | Payer: Medicare Other | Source: Ambulatory Visit | Attending: Radiation Oncology | Admitting: Radiation Oncology

## 2014-12-29 DIAGNOSIS — C50512 Malignant neoplasm of lower-outer quadrant of left female breast: Secondary | ICD-10-CM | POA: Diagnosis not present

## 2014-12-29 NOTE — Addendum Note (Signed)
Encounter addended by: Jenene Slicker, RN on: 12/29/2014 11:18 AM<BR>     Documentation filed: Inpatient Patient Education

## 2014-12-30 ENCOUNTER — Ambulatory Visit
Admission: RE | Admit: 2014-12-30 | Discharge: 2014-12-30 | Disposition: A | Discharge status: Home / Self Care | Payer: Medicare Other | Source: Ambulatory Visit | Attending: Radiation Oncology | Admitting: Radiation Oncology

## 2014-12-30 DIAGNOSIS — C50512 Malignant neoplasm of lower-outer quadrant of left female breast: Secondary | ICD-10-CM | POA: Diagnosis not present

## 2014-12-31 ENCOUNTER — Ambulatory Visit
Admission: RE | Admit: 2014-12-31 | Discharge: 2014-12-31 | Disposition: A | Discharge status: Home / Self Care | Payer: Medicare Other | Source: Ambulatory Visit | Attending: Radiation Oncology | Admitting: Radiation Oncology

## 2014-12-31 DIAGNOSIS — C50512 Malignant neoplasm of lower-outer quadrant of left female breast: Secondary | ICD-10-CM | POA: Diagnosis not present

## 2015-01-03 ENCOUNTER — Encounter: Payer: Self-pay | Admitting: *Deleted

## 2015-01-03 ENCOUNTER — Ambulatory Visit
Admission: RE | Admit: 2015-01-03 | Discharge: 2015-01-03 | Disposition: A | Discharge status: Home / Self Care | Payer: Medicare Other | Source: Ambulatory Visit | Attending: Radiation Oncology | Admitting: Radiation Oncology

## 2015-01-03 DIAGNOSIS — C50512 Malignant neoplasm of lower-outer quadrant of left female breast: Secondary | ICD-10-CM | POA: Diagnosis not present

## 2015-01-03 NOTE — Progress Notes (Signed)
Spoke to patient to ask if they could come in at 9:30am to have a PUT appointment with Dr. Valere Dross prior to radiation treatment.  Pt will be here.

## 2015-01-04 ENCOUNTER — Encounter: Payer: Self-pay | Admitting: Radiation Oncology

## 2015-01-04 ENCOUNTER — Ambulatory Visit
Admission: RE | Admit: 2015-01-04 | Discharge: 2015-01-04 | Disposition: A | Discharge status: Home / Self Care | Payer: Medicare Other | Source: Ambulatory Visit | Attending: Radiation Oncology | Admitting: Radiation Oncology

## 2015-01-04 VITALS — BP 173/82 | HR 71 | Resp 16 | Wt 186.8 lb

## 2015-01-04 DIAGNOSIS — C50512 Malignant neoplasm of lower-outer quadrant of left female breast: Secondary | ICD-10-CM

## 2015-01-04 NOTE — Progress Notes (Signed)
Weight stable. BP elevated. Denies pain. Denies skin changes to left/treated breast. No lymphedema noted. Denies fatigue.  BP 173/82 mmHg  Pulse 71  Resp 16  Wt 186 lb 12.8 oz (84.732 kg) Wt Readings from Last 3 Encounters:  01/04/15 186 lb 12.8 oz (84.732 kg)  12/27/14 187 lb 14.4 oz (85.231 kg)  12/01/14 184 lb 4.8 oz (83.598 kg)

## 2015-01-04 NOTE — Progress Notes (Signed)
Weekly Management Note:  Site: Left breast Current Dose:  2600 in 10 sessions cGy Projected Dose: 4256 in 16 sessions  cGy  Narrative: The patient is seen today for routine under treatment assessment. CBCT/MVCT images/port films were reviewed. The chart was reviewed.   She seen today before beginning her third week of radiation therapy for a weekly management visit.  She is without complaints today.  She uses Radioplex gel.  Physical Examination:  Filed Vitals:   01/04/15 0944  BP: 173/82  Pulse: 71  Resp: 16  .  Weight: 186 lb 12.8 oz (84.732 kg).  There is mild hyperpigmentation the skin along the left breast with no areas of desquamation.  Impression: Tolerating radiation therapy well.  Plan: Continue radiation therapy as planned.

## 2015-01-05 ENCOUNTER — Ambulatory Visit
Admission: RE | Admit: 2015-01-05 | Discharge: 2015-01-05 | Disposition: A | Discharge status: Home / Self Care | Payer: Medicare Other | Source: Ambulatory Visit | Attending: Radiation Oncology | Admitting: Radiation Oncology

## 2015-01-05 ENCOUNTER — Ambulatory Visit: Payer: Medicare Other | Admitting: Radiation Oncology

## 2015-01-05 DIAGNOSIS — C50512 Malignant neoplasm of lower-outer quadrant of left female breast: Secondary | ICD-10-CM | POA: Diagnosis not present

## 2015-01-06 ENCOUNTER — Ambulatory Visit
Admission: RE | Admit: 2015-01-06 | Discharge: 2015-01-06 | Disposition: A | Discharge status: Home / Self Care | Payer: Medicare Other | Source: Ambulatory Visit | Attending: Radiation Oncology | Admitting: Radiation Oncology

## 2015-01-06 DIAGNOSIS — C50512 Malignant neoplasm of lower-outer quadrant of left female breast: Secondary | ICD-10-CM | POA: Diagnosis not present

## 2015-01-07 ENCOUNTER — Ambulatory Visit
Admission: RE | Admit: 2015-01-07 | Discharge: 2015-01-07 | Disposition: A | Discharge status: Home / Self Care | Payer: Medicare Other | Source: Ambulatory Visit | Attending: Radiation Oncology | Admitting: Radiation Oncology

## 2015-01-07 DIAGNOSIS — C50512 Malignant neoplasm of lower-outer quadrant of left female breast: Secondary | ICD-10-CM | POA: Diagnosis not present

## 2015-01-10 ENCOUNTER — Encounter: Payer: Self-pay | Admitting: Radiation Oncology

## 2015-01-10 ENCOUNTER — Ambulatory Visit
Admission: RE | Admit: 2015-01-10 | Discharge: 2015-01-10 | Disposition: A | Discharge status: Home / Self Care | Payer: Medicare Other | Source: Ambulatory Visit | Attending: Radiation Oncology | Admitting: Radiation Oncology

## 2015-01-10 VITALS — BP 183/77 | HR 66 | Temp 98.0°F | Ht 68.0 in | Wt 185.6 lb

## 2015-01-10 DIAGNOSIS — C50512 Malignant neoplasm of lower-outer quadrant of left female breast: Secondary | ICD-10-CM | POA: Diagnosis not present

## 2015-01-10 NOTE — Progress Notes (Signed)
   Weekly Management Note:  outpatient    ICD-9-CM ICD-10-CM   1. Breast cancer of lower-outer quadrant of left female breast 174.5 C50.512     Current Dose:  39.9 Gy  Projected Dose: 42.56 Gy   Narrative:  The patient presents for routine under treatment assessment.  CBCT/MVCT images/Port film x-rays were reviewed.  The chart was checked. Doing well  Physical Findings:  height is 5\' 8"  (1.727 m) and weight is 185 lb 9.6 oz (84.188 kg). Her temperature is 98 F (36.7 C). Her blood pressure is 183/77 and her pulse is 66.   Wt Readings from Last 3 Encounters:  01/10/15 185 lb 9.6 oz (84.188 kg)  01/04/15 186 lb 12.8 oz (84.732 kg)  12/27/14 187 lb 14.4 oz (85.231 kg)   NAD, mild hyperpigmentation of left breast  Impression:  The patient is tolerating radiotherapy.  Plan:  Continue radiotherapy as planned. F/u 1 mo  ________________________________   Eppie Gibson, M.D.

## 2015-01-10 NOTE — Progress Notes (Signed)
[name] has reveived 15 fractions to her left breast.  Reports "occassional" shooting pains in her treated breast. Mild hyperpigmentation.  Note elevated BP today.

## 2015-01-11 ENCOUNTER — Encounter: Payer: Self-pay | Admitting: Radiation Oncology

## 2015-01-11 ENCOUNTER — Ambulatory Visit
Admission: RE | Admit: 2015-01-11 | Discharge: 2015-01-11 | Disposition: A | Discharge status: Home / Self Care | Payer: Medicare Other | Source: Ambulatory Visit | Attending: Radiation Oncology | Admitting: Radiation Oncology

## 2015-01-11 DIAGNOSIS — C50512 Malignant neoplasm of lower-outer quadrant of left female breast: Secondary | ICD-10-CM | POA: Diagnosis not present

## 2015-01-16 NOTE — Progress Notes (Signed)
  Radiation Oncology         [phone] ________________________________  Name: [name] MRN: [id number]  Date: 01/11/2015  DOB: 1934-08-04  End of Treatment Note  Diagnosis:    Left breast stage I, pT1c N0 clinical M0 invasive mammary carcinoma ER 90% PR 90% Her-2 (-)  Indication for treatment:  curative      Radiation treatment dates:   12/21/2014-01/11/2015  Site/dose:   Left Breast / 42.56 Gy in 16 fractions  Beams/energy:   3D-Conformal / 6X  Narrative: The patient tolerated radiation treatment relatively well.      Plan: The patient has completed radiation treatment. The patient will return to radiation oncology clinic for routine followup in one month. I advised them to call or return sooner if they have any questions or concerns related to their recovery or treatment.  -----------------------------------  Eppie Gibson, MD

## 2015-01-24 ENCOUNTER — Ambulatory Visit (INDEPENDENT_AMBULATORY_CARE_PROVIDER_SITE_OTHER): Payer: Medicare Other | Admitting: Family Medicine

## 2015-01-24 ENCOUNTER — Telehealth: Payer: Self-pay | Admitting: *Deleted

## 2015-01-24 ENCOUNTER — Encounter: Payer: Self-pay | Admitting: Family Medicine

## 2015-01-24 VITALS — BP 169/71 | HR 69 | Temp 98.3°F | Resp 16 | Ht 68.0 in | Wt 182.0 lb

## 2015-01-24 DIAGNOSIS — I10 Essential (primary) hypertension: Secondary | ICD-10-CM

## 2015-01-24 MED ORDER — LOSARTAN POTASSIUM 50 MG PO TABS: ORAL_TABLET | ORAL | Status: DC

## 2015-01-24 NOTE — Progress Notes (Signed)
[facility] [address], [place] [address] [phone]  Date:  01/24/2015   Name:  [name]   DOB:  Jul 02, 1934   MRN:  [id number]  PCP:  Ellsworth Lennox, MD    Chief Complaint: Follow-up and Hypertension   History of Present Illness:  [name] is a 80 y.o. very pleasant female patient who presents with the following:  Here today to follow-up on her HTN.  She just finished up radiation treatment for left breast cancer, last seen here in May of this year.    She will take an oral chemo she thinks, but she is not quite sure what she will be taking.   She is compliant with her losartan at home, and gets numbers like what we have gotten here. There is a family history of HTN.   She was noted to have osteopenia on a dexa scan.  She is taking magnesium and calcium  BP Readings from Last 3 Encounters:  01/24/15 169/71  01/10/15 183/77  01/04/15 173/82     Patient Active Problem List   Diagnosis Date Noted  . Osteoporosis 10/20/2014  . Breast cancer of lower-outer quadrant of left female breast 10/11/2014  . Pure hypercholesterolemia 04/08/2014  . Anxiety   . PUD (peptic ulcer disease)   . HTN (hypertension), benign 06/29/2011  . GERD (gastroesophageal reflux disease) 06/29/2011    Past Medical History  Diagnosis Date  . Hypertension   . Anxiety   . PUD (peptic ulcer disease)   . GERD (gastroesophageal reflux disease)   . Breast cancer of lower-outer quadrant of left female breast 10/11/2014  . Breast cancer   . PONV (postoperative nausea and vomiting)   . Stone, kidney     Past Surgical History  Procedure Laterality Date  . Breast lumpectomy with sentinel lymph node biopsy Left 10/22/2014    Procedure: BREAST LUMPECTOMY WITH SENTINEL LYMPH NODE BIOPSY;  Surgeon: Autumn Messing III, MD;  Location: [place];  Service: General;  Laterality: Left;  . Re-excision of breast cancer,superior margins Left 11/04/2014   Procedure: RE-EXCISION OF LEFT BREAST INFERIOR MARGINS;  Surgeon: Autumn Messing III, MD;  Location: [facility];  Service: General;  Laterality: Left;    Social History  Substance Use Topics  . Smoking status: Never Smoker   . Smokeless tobacco: Never Used  . Alcohol Use: No    Family History  Problem Relation Age of Onset  . Pancreatic cancer Mother     pancreastic  . Stroke Father   . Hypertension Brother   . Hypertension Sister   . Hypertension Brother   . Heart disease Brother   . Hypertension Sister     Allergies  Allergen Reactions  . Lisinopril     D/c lisinopril due to cough    Medication list has been reviewed and updated.  Current Outpatient Prescriptions on File Prior to Visit  Medication Sig Dispense Refill  . Ascorbic Acid (VITAMIN C) 1000 MG tablet Take 1,000 mg by mouth daily.    Marland Kitchen aspirin 81 MG tablet Take 81 mg by mouth 3 (three) times a week.     . cholecalciferol (VITAMIN D) 1000 UNITS tablet Take 1,000 Units by mouth daily. Over the counter    . co-enzyme Q-10 30 MG capsule Take 30 mg by mouth 3 (three) times daily.    . fish oil-omega-3 fatty acids 1000 MG capsule Take 1 g by mouth daily.    Marland Kitchen GARLIC PO  Take by mouth daily.    Marland Kitchen losartan (COZAAR) 50 MG tablet Take 1 tablet (50 mg total) by mouth daily. 30 tablet 5  . Multiple Vitamin (MULTIVITAMIN) tablet Take 1 tablet by mouth daily.     No current facility-administered medications on file prior to visit.    Review of Systems:  As per HPI- otherwise negative.   Physical Examination: Filed Vitals:   01/24/15 0903  BP: 169/71  Pulse: 69  Temp: 98.3 F (36.8 C)  Resp: 16   Filed Vitals:   01/24/15 0903  Height: 5\' 8"  (1.727 m)  Weight: 182 lb (82.555 kg)   Body mass index is 27.68 kg/(m^2). Ideal Body Weight: Weight in (lb) to have BMI = 25: 164.1  GEN: WDWN, NAD, Non-toxic, A & O x 3, looks well HEENT: Atraumatic, Normocephalic. Neck supple. No masses, No LAD. Ears and Nose: No external  deformity. CV: RRR, No M/G/R. No JVD. No thrill. No extra heart sounds. PULM: CTA B, no wheezes, crackles, rhonchi. No retractions. No resp. distress. No accessory muscle use. EXTR: No c/c/e NEURO Normal gait.  PSYCH: Normally interactive. Conversant. Not depressed or anxious appearing.  Calm demeanor.    Assessment and Plan: Essential hypertension - Plan: losartan (COZAAR) 50 MG tablet, DISCONTINUED: losartan (COZAAR) 50 MG tablet  SBP is uncontrolled; however her diastolic numbers are good. Will increase her losartan to 75- go slowly so as not to drop her DBP She is able to check her BP at home and will be in touch with her with readings- may increase to 100 mg if needed   Signed Lamar Blinks, MD

## 2015-01-24 NOTE — Telephone Encounter (Signed)
Spoke to pt concerning needs post-xrt. Relate doing well and without complaints. Encourage pt to call with needs or questions. Received verbal understanding. Contact information given.

## 2015-01-24 NOTE — Patient Instructions (Signed)
We are going to increase your losartan to 75 mg (1.5 tablets) daily to better control your BP Please check your BP at home a few times a week and send me an email with some readings.   We can go up to 100 mg if needed Let me know if you have any concerns such as dizziness.

## 2015-02-01 ENCOUNTER — Other Ambulatory Visit: Payer: Self-pay | Admitting: Adult Health

## 2015-02-01 DIAGNOSIS — C50512 Malignant neoplasm of lower-outer quadrant of left female breast: Secondary | ICD-10-CM

## 2015-02-04 ENCOUNTER — Telehealth: Payer: Self-pay | Admitting: Oncology

## 2015-02-04 NOTE — Telephone Encounter (Signed)
Called patient to schedule survivorship and she declines at this time

## 2015-02-10 ENCOUNTER — Ambulatory Visit (HOSPITAL_BASED_OUTPATIENT_CLINIC_OR_DEPARTMENT_OTHER): Payer: Medicare Other | Admitting: Oncology

## 2015-02-10 ENCOUNTER — Telehealth: Payer: Self-pay | Admitting: Oncology

## 2015-02-10 VITALS — BP 177/77 | HR 74 | Temp 98.2°F | Resp 18 | Ht 68.0 in | Wt 185.2 lb

## 2015-02-10 DIAGNOSIS — M810 Age-related osteoporosis without current pathological fracture: Secondary | ICD-10-CM | POA: Diagnosis not present

## 2015-02-10 DIAGNOSIS — C50412 Malignant neoplasm of upper-outer quadrant of left female breast: Secondary | ICD-10-CM | POA: Diagnosis not present

## 2015-02-10 DIAGNOSIS — C50512 Malignant neoplasm of lower-outer quadrant of left female breast: Secondary | ICD-10-CM

## 2015-02-10 MED ORDER — ANASTROZOLE 1 MG PO TABS: 1.0000 mg | ORAL_TABLET | Freq: Every day | ORAL | Status: DC

## 2015-02-10 NOTE — Progress Notes (Signed)
[facility]  [phone] [phone]     ID: [name] DOB: Aug 28, 1934  MR#: [id number]  RCB#:[id number]  Patient Care Team: Barton Fanny, MD as PCP - General (Family Medicine) Clarene Essex, MD as Consulting Physician (Gastroenterology) Autumn Messing III, MD as Consulting Physician (General Surgery) Chauncey Cruel, MD as Consulting Physician (Oncology) Eppie Gibson, MD as Attending Physician (Radiation Oncology) Mauro Kaufmann, RN as Registered Nurse Rockwell Germany, RN as Registered Nurse Holley Bouche, NP as Nurse Practitioner (Nurse Practitioner) PCP: Ellsworth Lennox, MD OTHER MD:  CHIEF COMPLAINT: Estrogen receptor positive breast cancer  CURRENT TREATMENT:    BREAST CANCER HISTORY: From the original intake note:  [name] noted a palpable mass in the left breast and brought it to her primary care physician's attention. On 09/27/2014 the patient underwent bilateral diagnostic mammography with tomosynthesis and left breast ultrasonography. The breast density was category B. In the outer left breast area there was an irregularly shaped mass not associated with suspicious microcalcifications or distortion. The previously described large of fibroadenolipoma in the right breast was unchanged. On physical exam there was a firm palpable mass at the 3:30 position in the left breast 2 cm prone the nipple which, on targeted ultrasound, was irregular, hypoechoic, and measured 2.0 cm. Ultrasound of the left axilla was negative.  Biopsy of this mass 10/05/2014 showed ([id number]) an invasive ductal carcinoma him a grade 1, estrogen receptor 90% positive, progesterone receptor 90% positive, both with strong staining intensity, with an MIB-1 of 5%, and no HER-2 amplification, the signals ratio being 1.06 and the number per cell 1.70.  The patient's subsequent history is as detailed below  INTERVAL HISTORY: [name] returns today for follow-up of her  breast cancer. Since her last visit here she underwent left lumpectomy and sentinel lymph node sampling 10/22/2014. The final pathology ([id number]) showed a mixed mucinous and ductal invasive ductal carcinoma measuring 1.8 cm. This was grade 2. The single sentinel lymph node was clear. Margins were positive, but cleared with subsequent surgery 11/04/2014 ([id number]). Subsequently she received adjuvant radiation, which she completed in early August. She is here now to discuss antiestrogen therapy.   REVIEW OF SYSTEMS: She did well with her surgery and radiation. Nothing "slowed her down". She feels she is right back to her original functional status. Aside from ringing in her ears, which is a chronic problem, a detailed review of systems today was noncontributory    . PAST MEDICAL HISTORY: Past Medical History  Diagnosis Date  . Hypertension   . Anxiety   . PUD (peptic ulcer disease)   . GERD (gastroesophageal reflux disease)   . Breast cancer of lower-outer quadrant of left female breast 10/11/2014  . Breast cancer   . PONV (postoperative nausea and vomiting)   . Stone, kidney     PAST SURGICAL HISTORY: Past Surgical History  Procedure Laterality Date  . Breast lumpectomy with sentinel lymph node biopsy Left 10/22/2014    Procedure: BREAST LUMPECTOMY WITH SENTINEL LYMPH NODE BIOPSY;  Surgeon: Autumn Messing III, MD;  Location: [facility];  Service: General;  Laterality: Left;  . Re-excision of breast cancer,superior margins Left 11/04/2014    Procedure: RE-EXCISION OF LEFT BREAST INFERIOR MARGINS;  Surgeon: Autumn Messing III, MD;  Location: [facility];  Service: General;  Laterality: Left;    FAMILY HISTORY Family History  Problem Relation Age of Onset  . Pancreatic cancer Mother     pancreastic  .  Stroke Father   . Hypertension Brother   . Hypertension Sister   . Hypertension Brother   . Heart disease Brother   . Hypertension Sister    the patient's father died following a  stroke at age 41. The patient's mother died from pancreatic cancer the age of 42. The patient had 5 brothers, 2 sisters. There is no history of breast or ovarian cancer in the family to the patient's knowledge   GYNECOLOGIC HISTORY:  No LMP recorded. Patient is postmenopausal.  menarche age 60, first live birth age 89, the patient is [place] P4. She went through the change of life in her late 51s. She took hormone replacement for approximately 3 years. She also took oral contraceptives for a few years remotely, with no complications.   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 Her husband [name] is a Horticulturist, commercial. Daughter [name] lives in  [place], near [place], where she Biomedical engineer. Son [name] lives in [place] and works in Building control surveyor. Son [name] also lives in [place]. He is a Environmental education officer with [employer]. Daughter [name] lives in [place] and teaches in [place] The patient has 11 grandchildren. She attends a local [place] DIRECTIVES:  the patient has named her son [name] as healthcare power of attorney. He can be reached at [phone].    HEALTH MAINTENANCE: Social History  Substance Use Topics  . Smoking status: Never Smoker   . Smokeless tobacco: Never Used  . Alcohol Use: No     Colonoscopy:  PAP:  Bone density:  Lipid panel:  Allergies  Allergen Reactions  . Lisinopril     D/c lisinopril due to cough    Current Outpatient Prescriptions  Medication Sig Dispense Refill  . Ascorbic Acid (VITAMIN C) 1000 MG tablet Take 1,000 mg by mouth daily.    Marland Kitchen aspirin 81 MG tablet Take 81 mg by mouth 3 (three) times a week.     Marland Kitchen CALCIUM & MAGNESIUM CARBONATES PO Take by mouth.    . cholecalciferol (VITAMIN D) 1000 UNITS tablet Take 1,000 Units by mouth daily. Over the counter    . co-enzyme Q-10 30 MG capsule Take 30 mg by  mouth 3 (three) times daily.    . fish oil-omega-3 fatty acids 1000 MG capsule Take 1 g by mouth daily.    Marland Kitchen GARLIC PO Take by mouth daily.    Marland Kitchen losartan (COZAAR) 50 MG tablet Take 75 mg (1.5 tablets) daily. Increase to 100 mg as needed 180 tablet 3  . Multiple Vitamin (MULTIVITAMIN) tablet Take 1 tablet by mouth daily.     No current facility-administered medications for this visit.    OBJECTIVE: older African-American woman in no acute distress Filed Vitals:   02/10/15 1039  BP: 177/77  Pulse: 74  Temp: 98.2 F (36.8 C)  Resp: 18     Body mass index is 28.17 kg/(m^2).    ECOG FS:0 - Asymptomatic  Sclerae unicteric, EOMs intact Oropharynx clear, dentition in good repair No cervical or supraclavicular adenopathy Lungs no rales or rhonchi Heart regular rate and rhythm Abd soft, nontender, positive bowel sounds MSK no focal spinal tenderness, no upper extremity lymphedema Neuro: nonfocal, well oriented, appropriate affect Breasts: The right breast is unremarkable. The left breast is status post lumpectomy and radiation. There is residual hyperpigmentation over the radiation port. The left breast is slightly  smaller, perhaps 5%, than the right. There is no evidence of recurrent disease. The left axilla is benign.   LAB RESULTS:  CMP     Component Value Date/Time   NA 141 11/04/2014 0652   NA 144 10/13/2014 1231   K 3.5 11/04/2014 0652   K 3.9 10/13/2014 1231   CL 108 11/04/2014 0652   CO2 24 11/04/2014 0652   CO2 24 10/13/2014 1231   GLUCOSE 103* 11/04/2014 0652   GLUCOSE 111 10/13/2014 1231   BUN 15 11/04/2014 0652   BUN 21.8 10/13/2014 1231   CREATININE 0.97 11/04/2014 0652   CREATININE 1.2* 10/13/2014 1231   CREATININE 0.89 09/09/2014 1150   CALCIUM 9.9 11/04/2014 0652   CALCIUM 10.6* 10/13/2014 1231   PROT 7.1 10/13/2014 1231   PROT 7.3 09/09/2014 1150   ALBUMIN 3.8 10/13/2014 1231   ALBUMIN 4.3 09/09/2014 1150   AST 19 10/13/2014 1231   AST 18 09/09/2014 1150    ALT 15 10/13/2014 1231   ALT 18 09/09/2014 1150   ALKPHOS 64 10/13/2014 1231   ALKPHOS 55 09/09/2014 1150   BILITOT 0.49 10/13/2014 1231   BILITOT 0.8 09/09/2014 1150   GFRNONAA 55* 11/04/2014 0652   GFRNONAA 63 09/09/2014 1150   GFRAA >60 11/04/2014 0652   GFRAA 72 09/09/2014 1150    INo results found for: SPEP, UPEP  Lab Results  Component Value Date   WBC 5.6 11/04/2014   NEUTROABS 3.6 10/13/2014   HGB 11.6* 11/04/2014   HCT 36.2 11/04/2014   MCV 89.8 11/04/2014   PLT 220 11/04/2014      Chemistry      Component Value Date/Time   NA 141 11/04/2014 0652   NA 144 10/13/2014 1231   K 3.5 11/04/2014 0652   K 3.9 10/13/2014 1231   CL 108 11/04/2014 0652   CO2 24 11/04/2014 0652   CO2 24 10/13/2014 1231   BUN 15 11/04/2014 0652   BUN 21.8 10/13/2014 1231   CREATININE 0.97 11/04/2014 0652   CREATININE 1.2* 10/13/2014 1231   CREATININE 0.89 09/09/2014 1150      Component Value Date/Time   CALCIUM 9.9 11/04/2014 0652   CALCIUM 10.6* 10/13/2014 1231   ALKPHOS 64 10/13/2014 1231   ALKPHOS 55 09/09/2014 1150   AST 19 10/13/2014 1231   AST 18 09/09/2014 1150   ALT 15 10/13/2014 1231   ALT 18 09/09/2014 1150   BILITOT 0.49 10/13/2014 1231   BILITOT 0.8 09/09/2014 1150       No results found for: LABCA2  No components found for: LABCA125  No results for input(s): INR in the last 168 hours.  Urinalysis    Component Value Date/Time   COLORURINE YELLOW 02/01/2011 1158   APPEARANCEUR CLEAR 02/01/2011 1158   LABSPEC 1.008 02/01/2011 1158   PHURINE 8.0 02/01/2011 1158   GLUCOSEU NEGATIVE 02/01/2011 1158   HGBUR NEGATIVE 02/01/2011 1158   BILIRUBINUR neg 09/14/2014 1308   BILIRUBINUR NEGATIVE 02/01/2011 1158   KETONESUR NEGATIVE 02/01/2011 1158   PROTEINUR neg 09/14/2014 1308   PROTEINUR NEGATIVE 02/01/2011 1158   UROBILINOGEN 0.2 09/14/2014 1308   UROBILINOGEN 0.2 02/01/2011 1158   NITRITE neg 09/14/2014 1308   NITRITE NEGATIVE 02/01/2011 1158    LEUKOCYTESUR moderate (2+) 09/14/2014 1308    STUDIES: No results found.  ASSESSMENT: 80 y.o.  [place] woman status post left breast biopsy 10/05/2014 for a clinical T1 N0, stage IA invasive ductal breast cancer, grade 1, estrogen and progesterone receptor positive, HER-2 not amplified, with  an MIB-1 of 5%   (1) left lumpectomy and sentinel lymph node sampling 10/22/2014 showed a pT1c pN0, stage IA mixed ductal and mucinous invasive carcinoma, grade 2, repeat HER-2 again negative  (a) positive margins were cleared with subsequent surgery 11/04/2014.  (2) radiation 12/21/2014-01/11/2015  Site/dose: Left Breast / 42.56 Gy in 16 fractions   (3) anti-estrogens to follow radiation  PLAN: Nicha has now completed her local treatment for breast cancer. She has a very good chance of this cancer not recurring in her breast. She is now ready to start systemic therapy.  She understands that if some cancer cells escaped the breast before she had surgery they are still somewhere in her body and they will eventually cause problems on this. They'll with. We have 2 choices for systemic therapy, one of them is chemotherapy, which we generally prefer not to use in cases like hers, and anti-estrogens.  As far as the anti-estrogens go we have a big choice between tamoxifen and anastrozole. We discussed the possible toxicities, side effects and complications of these agents in detail today and she was given this information in writing.  Because she already has osteoporosis she understands that if we go with the anastrozole she will need something to counteract that. That would be in her case denosumab given as Prolia twice a year for 5 years, while she is on the anastrozole.  After much discussion despite the osteoporosis concern she would prefer to start the anastrozole and I have put that prescription in for her. I have discussed cost issues as well and if the cost is going to be excessive she will let us  know. She will also call with any side effects that she experiences between now and her return visit here which will be in December.  At the December visit, if she is tolerating the tamoxifen well, we will also start her per Denny Peon which will be repeated every 6 months.  She has a good understanding of this plan. She agrees with it appears she knows a goal of treatment in her case is cure.  Chauncey Cruel, MD   02/10/2015 10:58 AM Medical Oncology and Hematology [facility] [address] [place], [place] [address] Tel. [phone]    Fax. [phone]

## 2015-02-10 NOTE — Telephone Encounter (Signed)
Gave avs & calendar for December. °

## 2015-02-11 ENCOUNTER — Ambulatory Visit: Payer: Medicare Other | Admitting: Radiation Oncology

## 2015-03-01 DIAGNOSIS — C50412 Malignant neoplasm of upper-outer quadrant of left female breast: Secondary | ICD-10-CM | POA: Diagnosis not present

## 2015-03-11 ENCOUNTER — Encounter: Payer: Self-pay | Admitting: Radiation Oncology

## 2015-03-11 ENCOUNTER — Ambulatory Visit
Admission: RE | Admit: 2015-03-11 | Discharge: 2015-03-11 | Disposition: A | Discharge status: Home / Self Care | Payer: Medicare Other | Source: Ambulatory Visit | Attending: Radiation Oncology | Admitting: Radiation Oncology

## 2015-03-11 VITALS — BP 170/90 | HR 65 | Temp 97.5°F | Ht 68.0 in | Wt 184.9 lb

## 2015-03-11 DIAGNOSIS — C50512 Malignant neoplasm of lower-outer quadrant of left female breast: Secondary | ICD-10-CM

## 2015-03-11 NOTE — Progress Notes (Signed)
Radiation Oncology         [phone] ________________________________  Name: [name] MRN: [id number]  Date: 03/11/2015  DOB: 24-Nov-1934  Follow-Up Visit Note  Outpatient  CC: Ellsworth Lennox, MD  Autumn Messing III, MD  Diagnosis:  Left breast stage I, pT1c N0 clinical M0 invasive mammary carcinoma ER 90% PR 90% Her-2 (-)   ICD-9-CM ICD-10-CM   1. Breast cancer of lower-outer quadrant of left female breast ([place]) 174.5 C50.512     Indication for treatment:  curative    Radiation treatment dates:   12/21/2014-01/11/2015 Site/dose:   Left Breast / 42.56 Gy in 16 fractions Beams/energy:   3D-Conformal / 6X  Narrative: Ms. Maleny Candy presents for follow-up of radiation to her left breast completed on 01/11/2015. She denies any pain at this time. She does report fatigue "when she is doing a lot", but it able to complete her normal activities. Her skin is intact, with a dark discoloration to her lateral left breast. She denies any tenderness or itching to her left breast and is still using the radiaplex cream as needed.                              She is on a anti-estrogen pill taking under the prescription of Dr.Magrinant   ALLERGIES:  is allergic to lisinopril.  Meds: Current Outpatient Prescriptions  Medication Sig Dispense Refill  . anastrozole (ARIMIDEX) 1 MG tablet Take 1 tablet (1 mg total) by mouth daily. 90 tablet 4  . Ascorbic Acid (VITAMIN C) 1000 MG tablet Take 1,000 mg by mouth daily.    Marland Kitchen aspirin 81 MG tablet Take 81 mg by mouth 3 (three) times a week.     . cholecalciferol (VITAMIN D) 1000 UNITS tablet Take 1,000 Units by mouth daily. Over the counter    . co-enzyme Q-10 30 MG capsule Take 30 mg by mouth 3 (three) times daily.    . fish oil-omega-3 fatty acids 1000 MG capsule Take 1 g by mouth daily.    Marland Kitchen GARLIC PO Take by mouth daily.    Marland Kitchen losartan (COZAAR) 50 MG tablet Take 75 mg (1.5 tablets) daily. Increase to 100 mg as needed 180 tablet 3  . Multiple  Vitamin (MULTIVITAMIN) tablet Take 1 tablet by mouth daily.    Marland Kitchen CALCIUM & MAGNESIUM CARBONATES PO Take by mouth.     No current facility-administered medications for this encounter.    Physical Findings: The patient is in no acute distress. Patient is alert and oriented.  height is $RemoveB'5\' 8"'hKyXxlju$  (1.727 m) and weight is 184 lb 14.4 oz (83.87 kg). Her temperature is 97.5 F (36.4 C). Her blood pressure is 170/90 and her pulse is 65. Marland Kitchen    Residual hyperpigmentation over the left breast particularly in the areola.   Lab Findings: Lab Results  Component Value Date   WBC 5.6 11/04/2014   HGB 11.6* 11/04/2014   HCT 36.2 11/04/2014   MCV 89.8 11/04/2014   PLT 220 11/04/2014    Radiographic Findings: No results found.  Impression/Plan:  Healing well from RT I advised to use Vitamin E cream/oil on skin for further healing. I encouraged her to continue with yearly mammography and followup with medical oncology. I will see her back on an as-needed basis. I have encouraged her to call if she has any issues or concerns in the future. I wished her the very best.  _____________________________________   Judson Roch  Isidore Moos, MD  This document serves as a record of services personally performed by Eppie Gibson, MD. It was created on her behalf by Derek Mound, a trained medical scribe. The creation of this record is based on the scribe's personal observations and the provider's statements to them. This document has been checked and approved by the attending provider.

## 2015-03-11 NOTE — Progress Notes (Signed)
Ms. Omaira Kohut presents for follow-up of radiation to her left breast completed on 01/11/2015. She denies any pain at this time. She does report fatigue "when she is doing a lot", but it able to complete her normal activities. Her skin is intact, with a dark discoloration to her lateral left breast. She denies any tenderness or itching to her left breast and is still using the radiaplex cream as needed.  BP 170/90 mmHg  Pulse 65  Temp(Src) 97.5 F (36.4 C)  Ht 5\' 8"  (1.727 m)  Wt 184 lb 14.4 oz (83.87 kg)  BMI 28.12 kg/m2  Wt Readings from Last 3 Encounters:  03/11/15 184 lb 14.4 oz (83.87 kg)  02/10/15 185 lb 3.2 oz (84.006 kg)  01/24/15 182 lb (82.555 kg)

## 2015-03-24 ENCOUNTER — Encounter: Payer: Self-pay | Admitting: Nurse Practitioner

## 2015-03-24 NOTE — Progress Notes (Signed)
The Survivorship Care Plan was mailed to [name] as she reported not being able to come in to the Survivorship Clinic for an in-person visit at this time. A letter was mailed to her outlining the purpose of the content of the care plan, as well as encouraging her to reach out to me with any questions or concerns.  My business card was included in the correspondence to the patient as well.  A copy of the care plan was also routed/faxed/mailed to [facility] [place], MD, the patient's PCP.  I will not be placing any follow-up appointments to the Survivorship Clinic for [name], but I am happy to see her at any time in the future for any survivorship concerns that may arise. Thank you for allowing me to participate in her care!  Kenn File, [place] [phone]

## 2015-05-04 ENCOUNTER — Encounter: Payer: Self-pay | Admitting: Adult Health

## 2015-05-04 NOTE — Progress Notes (Signed)
A birthday card was mailed to the patient today on behalf of the Survivorship Program at [facility].   Gretchen Dawson, NP Survivorship Program [facility] [phone]  

## 2015-05-16 ENCOUNTER — Ambulatory Visit: Payer: Medicare Other

## 2015-05-16 ENCOUNTER — Ambulatory Visit (HOSPITAL_BASED_OUTPATIENT_CLINIC_OR_DEPARTMENT_OTHER): Payer: Medicare Other | Admitting: Oncology

## 2015-05-16 ENCOUNTER — Other Ambulatory Visit (HOSPITAL_BASED_OUTPATIENT_CLINIC_OR_DEPARTMENT_OTHER): Payer: Medicare Other

## 2015-05-16 ENCOUNTER — Telehealth: Payer: Self-pay | Admitting: Oncology

## 2015-05-16 VITALS — BP 175/79 | HR 74 | Temp 97.7°F | Resp 18 | Ht 68.0 in | Wt 180.2 lb

## 2015-05-16 DIAGNOSIS — Z170 Estrogen receptor positive status [ER+]: Secondary | ICD-10-CM | POA: Diagnosis not present

## 2015-05-16 DIAGNOSIS — M810 Age-related osteoporosis without current pathological fracture: Secondary | ICD-10-CM | POA: Diagnosis not present

## 2015-05-16 DIAGNOSIS — C50911 Malignant neoplasm of unspecified site of right female breast: Secondary | ICD-10-CM

## 2015-05-16 DIAGNOSIS — C50912 Malignant neoplasm of unspecified site of left female breast: Secondary | ICD-10-CM

## 2015-05-16 DIAGNOSIS — C50512 Malignant neoplasm of lower-outer quadrant of left female breast: Secondary | ICD-10-CM

## 2015-05-16 LAB — CBC WITH DIFFERENTIAL/PLATELET
BASO%: 1.2 % (ref 0.0–2.0)
BASOS ABS: 0.1 10*3/uL (ref 0.0–0.1)
EOS%: 2 % (ref 0.0–7.0)
Eosinophils Absolute: 0.1 10*3/uL (ref 0.0–0.5)
HEMATOCRIT: 38.4 % (ref 34.8–46.6)
HGB: 12.1 g/dL (ref 11.6–15.9)
LYMPH#: 0.7 10*3/uL — AB (ref 0.9–3.3)
LYMPH%: 14.9 % (ref 14.0–49.7)
MCH: 29 pg (ref 25.1–34.0)
MCHC: 31.5 g/dL (ref 31.5–36.0)
MCV: 91.8 fL (ref 79.5–101.0)
MONO#: 0.4 10*3/uL (ref 0.1–0.9)
MONO%: 7.8 % (ref 0.0–14.0)
NEUT#: 3.6 10*3/uL (ref 1.5–6.5)
NEUT%: 74.1 % (ref 38.4–76.8)
Platelets: 207 10*3/uL (ref 145–400)
RBC: 4.18 10*6/uL (ref 3.70–5.45)
RDW: 13.3 % (ref 11.2–14.5)
WBC: 4.9 10*3/uL (ref 3.9–10.3)

## 2015-05-16 LAB — COMPREHENSIVE METABOLIC PANEL
ALT: 14 U/L (ref 0–55)
ANION GAP: 7 meq/L (ref 3–11)
AST: 17 U/L (ref 5–34)
Albumin: 3.8 g/dL (ref 3.5–5.0)
Alkaline Phosphatase: 62 U/L (ref 40–150)
BUN: 18.1 mg/dL (ref 7.0–26.0)
CALCIUM: 10.7 mg/dL — AB (ref 8.4–10.4)
CHLORIDE: 112 meq/L — AB (ref 98–109)
CO2: 23 mEq/L (ref 22–29)
Creatinine: 1 mg/dL (ref 0.6–1.1)
EGFR: 61 mL/min/{1.73_m2} — AB (ref >90)
Glucose: 106 mg/dl (ref 70–140)
POTASSIUM: 4.1 meq/L (ref 3.5–5.1)
Sodium: 142 mEq/L (ref 136–145)
Total Bilirubin: 0.7 mg/dL (ref 0.20–1.20)
Total Protein: 7.2 g/dL (ref 6.4–8.3)

## 2015-05-16 NOTE — Telephone Encounter (Signed)
Called patient and she is aware of her appointments,she states that she did not get her injection today as her ins had not been precertified.  Dr [name] advised her to come in on thursday

## 2015-05-16 NOTE — Progress Notes (Signed)
[name]  [phone] Fax:(336) [phone]     ID: [name] DOB: June 14, 1934  MR#: [id number]  VQX#:[id number]  Patient Care Team: Barton Fanny, MD as PCP - General (Family Medicine) Clarene Essex, MD as Consulting Physician (Gastroenterology) Autumn Messing III, MD as Consulting Physician (General Surgery) Chauncey Cruel, MD as Consulting Physician (Oncology) Eppie Gibson, MD as Attending Physician (Radiation Oncology) Mauro Kaufmann, RN as Registered Nurse Rockwell Germany, RN as Registered Nurse Holley Bouche, NP as Nurse Practitioner (Nurse Practitioner) Sylvan Cheese, NP as Nurse Practitioner (Hematology and Oncology) PCP: Ellsworth Lennox, MD OTHER MD:  CHIEF COMPLAINT: Estrogen receptor positive breast cancer  CURRENT TREATMENT: Anastrozole   BREAST CANCER HISTORY: From the original intake note:  [name] noted a palpable mass in the left breast and brought it to her primary care physician's attention. On 09/27/2014 the patient underwent bilateral diagnostic mammography with tomosynthesis and left breast ultrasonography. The breast density was category B. In the outer left breast area there was an irregularly shaped mass not associated with suspicious microcalcifications or distortion. The previously described large of fibroadenolipoma in the right breast was unchanged. On physical exam there was a firm palpable mass at the 3:30 position in the left breast 2 cm prone the nipple which, on targeted ultrasound, was irregular, hypoechoic, and measured 2.0 cm. Ultrasound of the left axilla was negative.  Biopsy of this mass 10/05/2014 showed ([id number]) an invasive ductal carcinoma him a grade 1, estrogen receptor 90% positive, progesterone receptor 90% positive, both with strong staining intensity, with an MIB-1 of 5%, and no HER-2 amplification, the signals ratio being 1.06 and the number per cell 1.70.  The patient's subsequent  history is as detailed below  INTERVAL HISTORY: [name] returns today for follow-up of her estrogen receptor positive breast cancer. She continues on anastrozole, which she is tolerating well. Hot flashes are not a significant problem, they are "rare". She does not have vaginal dryness issues. She does not report any arthralgias or myalgias. She is obtaining the drug at $4 per month.  REVIEW OF SYSTEMS: She is not exercising regularly except by walking. [facility]. She has some seasonal allergies and some ringing in her years but otherwise a detailed review of systems today was entirely stable    . PAST MEDICAL HISTORY: Past Medical History  Diagnosis Date  . Hypertension   . Anxiety   . PUD (peptic ulcer disease)   . GERD (gastroesophageal reflux disease)   . Breast cancer of lower-outer quadrant of left female breast ([place]) 10/11/2014  . Breast cancer ([place])   . PONV (postoperative nausea and vomiting)   . Stone, kidney     PAST SURGICAL HISTORY: Past Surgical History  Procedure Laterality Date  . Breast lumpectomy with sentinel lymph node biopsy Left 10/22/2014    Procedure: BREAST LUMPECTOMY WITH SENTINEL LYMPH NODE BIOPSY;  Surgeon: Autumn Messing III, MD;  Location: [facility];  Service: General;  Laterality: Left;  . Re-excision of breast cancer,superior margins Left 11/04/2014    Procedure: RE-EXCISION OF LEFT BREAST INFERIOR MARGINS;  Surgeon: Autumn Messing III, MD;  Location: [facility];  Service: General;  Laterality: Left;    FAMILY HISTORY Family History  Problem Relation Age of Onset  . Pancreatic cancer Mother     pancreastic  . Stroke Father   . Hypertension Brother   . Hypertension Sister   .  Hypertension Brother   . Heart disease Brother   . Hypertension Sister    the patient's father died following a stroke at age 52. The patient's mother died from pancreatic cancer the age of 46. The patient had 5 brothers, 2  sisters. There is no history of breast or ovarian cancer in the family to the patient's knowledge   GYNECOLOGIC HISTORY:  No LMP recorded. Patient is postmenopausal.  menarche age 31, first live birth age 89, the patient is [place] P4. She went through the change of life in her late 61s. She took hormone replacement for approximately 3 years. She also took oral contraceptives for a few years remotely, with no complications.   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 Her husband [name] is a Horticulturist, commercial. Daughter [name] lives in  [place], near [place], where she Biomedical engineer. Son [name] lives in [place] and works in Building control surveyor. Son [name] also lives in [place]. He is a Environmental education officer with [employer]. Daughter [name] lives in [place] and teaches in [place] ([place] The patient has 11 grandchildren. She attends a local [employer] DIRECTIVES:  the patient has named her son [name] as healthcare power of attorney. He can be reached at [phone].    HEALTH MAINTENANCE: Social History  Substance Use Topics  . Smoking status: Never Smoker   . Smokeless tobacco: Never Used  . Alcohol Use: No     Colonoscopy:  PAP:  Bone density:  Lipid panel:  Allergies  Allergen Reactions  . Lisinopril     D/c lisinopril due to cough    Current Outpatient Prescriptions  Medication Sig Dispense Refill  . anastrozole (ARIMIDEX) 1 MG tablet Take 1 tablet (1 mg total) by mouth daily. 90 tablet 4  . Ascorbic Acid (VITAMIN C) 1000 MG tablet Take 1,000 mg by mouth daily.    Marland Kitchen aspirin 81 MG tablet Take 81 mg by mouth 3 (three) times a week.     Marland Kitchen CALCIUM & MAGNESIUM CARBONATES PO Take by mouth.    . cholecalciferol (VITAMIN D) 1000 UNITS tablet Take 1,000 Units by mouth daily. Over the counter    . co-enzyme Q-10 30 MG capsule Take 30 mg by mouth 3 (three)  times daily.    . fish oil-omega-3 fatty acids 1000 MG capsule Take 1 g by mouth daily.    Marland Kitchen GARLIC PO Take by mouth daily.    Marland Kitchen losartan (COZAAR) 50 MG tablet Take 75 mg (1.5 tablets) daily. Increase to 100 mg as needed 180 tablet 3  . Multiple Vitamin (MULTIVITAMIN) tablet Take 1 tablet by mouth daily.     No current facility-administered medications for this visit.    OBJECTIVE: older African-American woman who appears stated age 80 Vitals:   05/16/15 1104  BP: 175/79  Pulse: 74  Temp: 97.7 F (36.5 C)  Resp: 18     Body mass index is 27.41 kg/(m^2).    ECOG FS:0 - Asymptomatic  Sclerae unicteric, pupils round and equal Oropharynx clear, slightly dry No cervical or supraclavicular adenopathy Lungs no rales or rhonchi Heart regular rate and rhythm Abd soft, nontender, positive bowel sounds MSK mild kyphosis but no focal spinal tenderness, no upper extremity lymphedema Neuro: nonfocal, well oriented, appropriate affect Breasts: Deferred    LAB RESULTS:  CMP     Component Value Date/Time   NA 141 11/04/2014  0652   NA 144 10/13/2014 1231   K 3.5 11/04/2014 0652   K 3.9 10/13/2014 1231   CL 108 11/04/2014 0652   CO2 24 11/04/2014 0652   CO2 24 10/13/2014 1231   GLUCOSE 103* 11/04/2014 0652   GLUCOSE 111 10/13/2014 1231   BUN 15 11/04/2014 0652   BUN 21.8 10/13/2014 1231   CREATININE 0.97 11/04/2014 0652   CREATININE 1.2* 10/13/2014 1231   CREATININE 0.89 09/09/2014 1150   CALCIUM 9.9 11/04/2014 0652   CALCIUM 10.6* 10/13/2014 1231   PROT 7.1 10/13/2014 1231   PROT 7.3 09/09/2014 1150   ALBUMIN 3.8 10/13/2014 1231   ALBUMIN 4.3 09/09/2014 1150   AST 19 10/13/2014 1231   AST 18 09/09/2014 1150   ALT 15 10/13/2014 1231   ALT 18 09/09/2014 1150   ALKPHOS 64 10/13/2014 1231   ALKPHOS 55 09/09/2014 1150   BILITOT 0.49 10/13/2014 1231   BILITOT 0.8 09/09/2014 1150   GFRNONAA 55* 11/04/2014 0652   GFRNONAA 63 09/09/2014 1150   GFRAA >60 11/04/2014 0652    GFRAA 72 09/09/2014 1150    INo results found for: SPEP, UPEP  Lab Results  Component Value Date   WBC 4.9 05/16/2015   NEUTROABS 3.6 05/16/2015   HGB 12.1 05/16/2015   HCT 38.4 05/16/2015   MCV 91.8 05/16/2015   PLT 207 05/16/2015      Chemistry      Component Value Date/Time   NA 141 11/04/2014 0652   NA 144 10/13/2014 1231   K 3.5 11/04/2014 0652   K 3.9 10/13/2014 1231   CL 108 11/04/2014 0652   CO2 24 11/04/2014 0652   CO2 24 10/13/2014 1231   BUN 15 11/04/2014 0652   BUN 21.8 10/13/2014 1231   CREATININE 0.97 11/04/2014 0652   CREATININE 1.2* 10/13/2014 1231   CREATININE 0.89 09/09/2014 1150      Component Value Date/Time   CALCIUM 9.9 11/04/2014 0652   CALCIUM 10.6* 10/13/2014 1231   ALKPHOS 64 10/13/2014 1231   ALKPHOS 55 09/09/2014 1150   AST 19 10/13/2014 1231   AST 18 09/09/2014 1150   ALT 15 10/13/2014 1231   ALT 18 09/09/2014 1150   BILITOT 0.49 10/13/2014 1231   BILITOT 0.8 09/09/2014 1150       No results found for: LABCA2  No components found for: LABCA125  No results for input(s): INR in the last 168 hours.  Urinalysis    Component Value Date/Time   COLORURINE YELLOW 02/01/2011 1158   APPEARANCEUR CLEAR 02/01/2011 1158   LABSPEC 1.008 02/01/2011 1158   PHURINE 8.0 02/01/2011 1158   GLUCOSEU NEGATIVE 02/01/2011 1158   HGBUR NEGATIVE 02/01/2011 1158   BILIRUBINUR neg 09/14/2014 1308   BILIRUBINUR NEGATIVE 02/01/2011 1158   KETONESUR NEGATIVE 02/01/2011 1158   PROTEINUR neg 09/14/2014 1308   PROTEINUR NEGATIVE 02/01/2011 1158   UROBILINOGEN 0.2 09/14/2014 1308   UROBILINOGEN 0.2 02/01/2011 1158   NITRITE neg 09/14/2014 1308   NITRITE NEGATIVE 02/01/2011 1158   LEUKOCYTESUR moderate (2+) 09/14/2014 1308    STUDIES: CLINICAL DATA: Postmenopausal. Baseline exam. The patient takes vitamin-D and calcium supplements. History of estrogen replacement therapy in the past.  EXAM: DUAL X-RAY ABSORPTIOMETRY (DXA) FOR BONE MINERAL  DENSITY  FINDINGS: AP LUMBAR SPINE L3, L4  Bone Mineral Density (BMD): 0.541 g/cm2  Young Adult T-Score: -5.1  Z-Score: -3.1  Left FEMUR neck  Bone Mineral Density (BMD): 0.569 g/cm2  Young Adult T-Score: -2.5  Z-Score: -1.0  ASSESSMENT: Patient's  diagnostic category is osteoporosis by WHO Criteria.  FRACTURE RISK: Increased  ASSESSMENT: 80 y.o.  [place] woman status post left breast biopsy 10/05/2014 for a clinical T1 N0, stage IA invasive ductal breast cancer, grade 1, estrogen and progesterone receptor positive, HER-2 not amplified, with an MIB-1 of 5%   (1) left lumpectomy and sentinel lymph node sampling 10/22/2014 showed a pT1c pN0, stage IA mixed ductal and mucinous invasive carcinoma, grade 2, repeat HER-2 again negative  (a) positive margins were cleared with subsequent surgery 11/04/2014.  (2) radiation 12/21/2014-01/11/2015: Left Breast / 42.56 Gy in 16 fractions   (3) anastrozole started September 2016  (a) bone density 09/27/2014 shows osteoporosis  PLAN: [name] is tolerating anastrozole well, and the plan will be to continue that for a total of 5 years.  The big concern of courses osteoporosis. She already has some kyphosis. We discussed the various options to try to strengthen her bones including alendronate or ibandronate, Denosumab, or zolendronate. She has been alarmed because some of these medications are advertised on TV.  Today we discussed Denosumab in detail. She understands the concern regarding low calcium blood levels, bone aches for a few days, and the remote risk of osteonecrosis of the jaw. She is up-to-date on her dental prophylaxis. I have asked her to get some Tums, since she does not like to take the calcium/magnesium preparation she was on previously, and take Tums 4 times a day on the day of the prolia shot. We are going to start that later this week, and then she will see [place] again in 6 months with a next shot. The plan is to  continue prolia for a total of 2 years and then reassess with a repeat bone density scan  She has a good understanding of this plan. She agrees with it. She will let us know if there are any problems before her next visit. Chauncey Cruel, MD   05/16/2015 11:11 AM Medical Oncology and Hematology [facility] [address] [place], [place] [address] Tel. [phone]    Fax. [phone]

## 2015-05-17 LAB — VITAMIN D 25 HYDROXY (VIT D DEFICIENCY, FRACTURES): Vit D, 25-Hydroxy: 33 ng/mL (ref 30–100)

## 2015-05-19 ENCOUNTER — Ambulatory Visit (HOSPITAL_BASED_OUTPATIENT_CLINIC_OR_DEPARTMENT_OTHER): Payer: Medicare Other

## 2015-05-19 VITALS — BP 171/78 | HR 70 | Temp 98.2°F

## 2015-05-19 DIAGNOSIS — M810 Age-related osteoporosis without current pathological fracture: Secondary | ICD-10-CM | POA: Diagnosis not present

## 2015-05-19 MED ORDER — DENOSUMAB 60 MG/ML ~~LOC~~ SOLN
60.0000 mg | Freq: Once | SUBCUTANEOUS | Status: AC
  Administered 2015-05-19: 60 mg via SUBCUTANEOUS
  Filled 2015-05-19: qty 1

## 2015-08-17 DIAGNOSIS — C50412 Malignant neoplasm of upper-outer quadrant of left female breast: Secondary | ICD-10-CM | POA: Diagnosis not present

## 2015-09-08 ENCOUNTER — Other Ambulatory Visit: Payer: Self-pay | Admitting: General Surgery

## 2015-09-08 ENCOUNTER — Other Ambulatory Visit: Payer: Self-pay | Admitting: Family Medicine

## 2015-09-08 DIAGNOSIS — Z853 Personal history of malignant neoplasm of breast: Secondary | ICD-10-CM

## 2015-09-28 ENCOUNTER — Ambulatory Visit
Admission: RE | Admit: 2015-09-28 | Discharge: 2015-09-28 | Disposition: A | Discharge status: Home / Self Care | Payer: Medicare Other | Source: Ambulatory Visit | Attending: General Surgery | Admitting: General Surgery

## 2015-09-28 DIAGNOSIS — Z853 Personal history of malignant neoplasm of breast: Secondary | ICD-10-CM

## 2015-09-28 DIAGNOSIS — R928 Other abnormal and inconclusive findings on diagnostic imaging of breast: Secondary | ICD-10-CM | POA: Diagnosis not present

## 2015-10-06 ENCOUNTER — Other Ambulatory Visit: Payer: Self-pay | Admitting: Oncology

## 2015-10-11 ENCOUNTER — Other Ambulatory Visit: Payer: Self-pay | Admitting: Nurse Practitioner

## 2015-11-17 ENCOUNTER — Ambulatory Visit (INDEPENDENT_AMBULATORY_CARE_PROVIDER_SITE_OTHER): Payer: Medicare Other | Admitting: Family Medicine

## 2015-11-17 ENCOUNTER — Encounter: Payer: Self-pay | Admitting: Family Medicine

## 2015-11-17 VITALS — BP 202/84 | HR 84 | Temp 98.4°F | Resp 16 | Ht 68.0 in | Wt 182.0 lb

## 2015-11-17 DIAGNOSIS — C50512 Malignant neoplasm of lower-outer quadrant of left female breast: Secondary | ICD-10-CM

## 2015-11-17 DIAGNOSIS — Z1211 Encounter for screening for malignant neoplasm of colon: Secondary | ICD-10-CM | POA: Diagnosis not present

## 2015-11-17 DIAGNOSIS — R7303 Prediabetes: Secondary | ICD-10-CM | POA: Diagnosis not present

## 2015-11-17 DIAGNOSIS — K219 Gastro-esophageal reflux disease without esophagitis: Secondary | ICD-10-CM

## 2015-11-17 DIAGNOSIS — Z1383 Encounter for screening for respiratory disorder NEC: Secondary | ICD-10-CM | POA: Diagnosis not present

## 2015-11-17 DIAGNOSIS — Z130 Encounter for screening for diseases of the blood and blood-forming organs and certain disorders involving the immune mechanism: Secondary | ICD-10-CM

## 2015-11-17 DIAGNOSIS — M810 Age-related osteoporosis without current pathological fracture: Secondary | ICD-10-CM | POA: Diagnosis not present

## 2015-11-17 DIAGNOSIS — F419 Anxiety disorder, unspecified: Secondary | ICD-10-CM

## 2015-11-17 DIAGNOSIS — Z1329 Encounter for screening for other suspected endocrine disorder: Secondary | ICD-10-CM

## 2015-11-17 DIAGNOSIS — Z1389 Encounter for screening for other disorder: Secondary | ICD-10-CM | POA: Diagnosis not present

## 2015-11-17 DIAGNOSIS — E7800 Pure hypercholesterolemia, unspecified: Secondary | ICD-10-CM

## 2015-11-17 DIAGNOSIS — Z136 Encounter for screening for cardiovascular disorders: Secondary | ICD-10-CM

## 2015-11-17 DIAGNOSIS — I10 Essential (primary) hypertension: Secondary | ICD-10-CM | POA: Diagnosis not present

## 2015-11-17 DIAGNOSIS — Z1239 Encounter for other screening for malignant neoplasm of breast: Secondary | ICD-10-CM | POA: Diagnosis not present

## 2015-11-17 DIAGNOSIS — Z0000 Encounter for general adult medical examination without abnormal findings: Secondary | ICD-10-CM

## 2015-11-17 DIAGNOSIS — Z1212 Encounter for screening for malignant neoplasm of rectum: Secondary | ICD-10-CM

## 2015-11-17 LAB — POCT GLYCOSYLATED HEMOGLOBIN (HGB A1C): Hemoglobin A1C: 6

## 2015-11-17 LAB — POCT URINALYSIS DIP (MANUAL ENTRY)
BILIRUBIN UA: NEGATIVE
Glucose, UA: NEGATIVE
Ketones, POC UA: NEGATIVE
Nitrite, UA: NEGATIVE
PH UA: 6
Spec Grav, UA: 1.02
UROBILINOGEN UA: 0.2

## 2015-11-17 LAB — CBC
HEMATOCRIT: 36.5 % (ref 35.0–45.0)
Hemoglobin: 12.1 g/dL (ref 11.7–15.5)
MCH: 29.7 pg (ref 27.0–33.0)
MCHC: 33.2 g/dL (ref 32.0–36.0)
MCV: 89.5 fL (ref 80.0–100.0)
MPV: 11.4 fL (ref 7.5–12.5)
Platelets: 251 10*3/uL (ref 140–400)
RBC: 4.08 MIL/uL (ref 3.80–5.10)
RDW: 13.5 % (ref 11.0–15.0)
WBC: 4.5 10*3/uL (ref 3.8–10.8)

## 2015-11-17 NOTE — Progress Notes (Signed)
Subjective:    [name] is a 81 y.o. female who presents for Medicare Annual/Subsequent preventive examination.  She was a patient of a colleague who has retired. This is my first time meeting [name].  Last CPE was 1 yr prior 09/09/14.  Preventive Screening-Counseling & Management  Tobacco History  Smoking status  . Never Smoker   Smokeless tobacco  . Never Used     Problems Prior to Visit 1. Breast cancer - left lower, found during her AWV last yr. Mammogram 2 mos ago was normal. 2. HTN - lisinopril caused nocturnal cough so changed to losartan 25 and has now increased that to losartan 100. Checks BP at home 150-160/50-70.  Her son is on a BP med that he thinks is working very well and pt might want to switch to that - requests to wait on BP med refill until she can call up with the name of her son's med to see if she can change. 3. Osteoporosis - DEXA 09/2014 showed osteoporosis with T score of -5.1 at lumbar and -2.5 at hip, being managed by pt's oncologist Dr. [name] being treated by prolia inj every 6 mos. Repeat dexa in 2 years (after Dec 2018) to see if need to cont. She was instructed to start chewing 4 tums/d for calcium.  Vit D level was normal at 33 6 mos prior. 4. HPL - Lipids last yr showed LDL 172 with non-hdl of 197 so she was encouraged to further increase her fish oil though she has been off of it for a while 5. Pre-DM - hgba1c was 6.0 -> 5.7 -> 6.2 -> 6.0  Current Problems (verified) Patient Active Problem List   Diagnosis Date Noted  . Osteoporosis 10/20/2014  . Breast cancer of lower-outer quadrant of left female breast ([place]) 10/11/2014  . Pure hypercholesterolemia 04/08/2014  . Anxiety   . PUD (peptic ulcer disease)   . HTN (hypertension), benign 06/29/2011  . GERD (gastroesophageal reflux disease) 06/29/2011    Medications Prior to Visit Current Outpatient Prescriptions on File Prior to Visit  Medication Sig Dispense Refill  . anastrozole  (ARIMIDEX) 1 MG tablet Take 1 tablet (1 mg total) by mouth daily. 90 tablet 4  . Ascorbic Acid (VITAMIN C) 1000 MG tablet Take 1,000 mg by mouth daily.    Marland Kitchen aspirin 81 MG tablet Take 81 mg by mouth 3 (three) times a week.     . cholecalciferol (VITAMIN D) 1000 UNITS tablet Take 1,000 Units by mouth daily. Over the counter    . co-enzyme Q-10 30 MG capsule Take 30 mg by mouth 3 (three) times daily.    . fish oil-omega-3 fatty acids 1000 MG capsule Take 1 g by mouth daily.    Marland Kitchen GARLIC PO Take by mouth daily.    Marland Kitchen losartan (COZAAR) 50 MG tablet Take 75 mg (1.5 tablets) daily. Increase to 100 mg as needed 180 tablet 3  . Multiple Vitamin (MULTIVITAMIN) tablet Take 1 tablet by mouth daily.     No current facility-administered medications on file prior to visit.    Current Medications (verified) Current Outpatient Prescriptions  Medication Sig Dispense Refill  . anastrozole (ARIMIDEX) 1 MG tablet Take 1 tablet (1 mg total) by mouth daily. 90 tablet 4  . Ascorbic Acid (VITAMIN C) 1000 MG tablet Take 1,000 mg by mouth daily.    Marland Kitchen aspirin 81 MG tablet Take 81 mg by mouth 3 (three) times a week.     . cholecalciferol (  VITAMIN D) 1000 UNITS tablet Take 1,000 Units by mouth daily. Over the counter    . co-enzyme Q-10 30 MG capsule Take 30 mg by mouth 3 (three) times daily.    . fish oil-omega-3 fatty acids 1000 MG capsule Take 1 g by mouth daily.    Marland Kitchen GARLIC PO Take by mouth daily.    Marland Kitchen losartan (COZAAR) 50 MG tablet Take 75 mg (1.5 tablets) daily. Increase to 100 mg as needed 180 tablet 3  . Multiple Vitamin (MULTIVITAMIN) tablet Take 1 tablet by mouth daily.     No current facility-administered medications for this visit.     Allergies (verified) Lisinopril   PAST HISTORY  Family History Family History  Problem Relation Age of Onset  . Pancreatic cancer Mother     pancreastic  . Stroke Father   . Hypertension Brother   . Hypertension Sister   . Hypertension Brother   . Heart disease  Brother   . Hypertension Sister     Social History Social History  Substance Use Topics  . Smoking status: Never Smoker   . Smokeless tobacco: Never Used  . Alcohol Use: No     Are there smokers in your home (other than you)? No  Risk Factors Current exercise habits: Home exercise routine includes walking, gardening, work around the house. She is not exercising regularly except by walking. [facility] there. Dietary issues discussed: tries to avoid meat and greasy foods and increase diet in veggies and fruits  Cardiac risk factors: advanced age (older than 43 for men, 99 for women), dyslipidemia, hypertension and sedentary lifestyle.  Depression Screen Depression screen [facility] 2/9 11/17/2015 12/01/2014 09/09/2014 04/08/2014 09/04/2013  Decreased Interest 0 0 0 0 0  Down, Depressed, Hopeless 0 0 0 0 0  PHQ - 2 Score 0 0 0 0 0     (Note: if answer to either of the following is "Yes", a more complete depression screening is indicated)   Over the past two weeks, have you felt down, depressed or hopeless? No  Over the past two weeks, have you felt little interest or pleasure in doing things? No  Have you lost interest or pleasure in daily life? No  Do you often feel hopeless? No  Do you cry easily over simple problems? No  Activities of Daily Living In your present state of health, do you have any difficulty performing the following activities?:  Driving? No Managing money?  No Feeding yourself? No Getting from bed to chair? No Climbing a flight of stairs? No Preparing food and eating?: No Bathing or showering? No - has a walk-in shower but is planning to get bars instead Getting dressed: No Getting to the toilet? No Using the toilet:No Moving around from place to place: No In the past year have you fallen or had a near fall?:No   Are you sexually active?  Yes - married - rare  Do you have more than one partner?  No  Hearing  Difficulties: Yes Do you often ask people to speak up or repeat themselves? Yes Do you experience ringing or noises in your ears? Yes Do you have difficulty understanding soft or whispered voices? Yes - has not needed hearing aides   Do you feel that you have a problem with memory? No  Do you often misplace items? No  Do you feel safe at home?  Yes  Cognitive Testing  Alert?Yes  Normal Appearance?Yes  Oriented to person? Yes  Place? Yes   Time? Yes  Recall of three objects?  Yes  Can perform simple calculations? Yes  Displays appropriate judgment?Yes  Can read the correct time from a watch face?Yes   Advanced Directives have been discussed with the patient? Yes  ADVANCED DIRECTIVES: the patient has named her son [name] as healthcare power of attorney. He is not the oldest but works indirectly with health care.  He can be reached at [phone].    List the Names of Other Physician/Practitioners you currently use: 1.  Clarene Essex, MD as Consulting Physician (Gastroenterology) 2.  Jovita Kussmaul, MD as Consulting Physician (General Surgery) 3.  Chauncey Cruel, MD as Consulting Physician (Oncology) 4.  Eppie Gibson, MD as Attending Physician (Radiation Oncology) 5.  Eye doctor - not seen in 2-3 yrs. 6.  Dentist - sees regularly   Indicate any recent Medical Services you may have received from other than [facility] providers in the past year (date may be approximate).  Immunization History  Administered Date(s) Administered  . Hepatitis B 07/15/2004, 08/19/2004, 01/12/2005  . Td 07/15/2004, 09/09/2014    Screening Tests Health Maintenance  Topic Date Due  . PNA vac Low Risk Adult (1 of 2 - PCV13) 05/14/2002  . ZOSTAVAX  02/02/2016 (Originally 05/14/1997)  . INFLUENZA VACCINE  09/02/2019 (Originally 01/03/2016)  . TETANUS/TDAP  09/08/2024  . DEXA SCAN  Completed    All answers were reviewed with the patient and necessary referrals were made:  Mariella Blackwelder, MD   11/17/2015    History reviewed: allergies, current medications, past family history, past medical history, past social history, past surgical history and problem list  Review of Systems Pertinent items are noted in HPI.    Objective:  BP 197/77 mmHg  Pulse 84  Temp(Src) 98.4 F (36.9 C) (Oral)  Resp 16  Ht 5\' 8"  (1.727 m)  Wt 182 lb (82.555 kg)  BMI 27.68 kg/m2  There is no weight on file to calculate BMI. There were no vitals taken for this visit.  BP 202/84 mmHg  Pulse 84  Temp(Src) 98.4 F (36.9 C) (Oral)  Resp 16  Ht 5\' 8"  (1.727 m)  Wt 182 lb (82.555 kg)  BMI 27.68 kg/m2  General Appearance:    Alert, cooperative, no distress, appears stated age  Head:    Normocephalic, without obvious abnormality, atraumatic  Eyes:    PERRL, conjunctiva/corneas clear, EOM's intact, fundi    benign, both eyes  Ears:    Normal TM's and external ear canals, both ears  Nose:   Nares normal, septum midline, mucosa normal, no drainage    or sinus tenderness  Throat:   Lips, mucosa, and tongue normal; teeth and gums normal  Neck:   Supple, symmetrical, trachea midline, no adenopathy;    thyroid:  no enlargement/tenderness/nodules; no carotid   bruit or JVD  Back:     Symmetric, no curvature, ROM normal, no CVA tenderness  Lungs:     Clear to auscultation bilaterally, respirations unlabored  Chest Wall:    No tenderness or deformity   Heart:    Regular rate and rhythm, S1 and S2 normal, no murmur, rub   or gallop  Breast Exam:    No tenderness, masses, or nipple abnormality  Abdomen:     Soft, non-tender, bowel sounds active all four quadrants,    no masses, no organomegaly        Extremities:   Extremities normal, atraumatic, no cyanosis or edema  Pulses:   2+ and symmetric all extremities  Skin:   Skin color, texture, turgor normal, no rashes or lesions  Lymph nodes:   Cervical, supraclavicular, and axillary nodes normal  Neurologic:   CNII-XII intact, normal strength, sensation and reflexes     throughout       Assessment:     1. Medicare annual wellness visit, subsequent   2. Screening for breast cancer   3. Screening for cardiovascular, respiratory, and genitourinary diseases   4. Screening for colorectal cancer   5. Screening for deficiency anemia   6. Screening for thyroid disorder   7. HTN (hypertension), benign   8. Osteoporosis   9. Pure hypercholesterolemia   10. Breast cancer of lower-outer quadrant of left female breast ([place])   11. Anxiety   12. Gastroesophageal reflux disease, esophagitis presence not specified   13. Prediabetes         Plan:     During the course of the visit the patient was educated and counseled about appropriate screening and preventive services including:     Immunizations: Td done 2016, needs prevnar, needs zostavax, needs flu Osteoporosis: Dexa done 09/27/14 CRS: did HOC last yr - negative, aged out of need for colonoscopy.  HPL: was supposed to be on fish oil but went off, LDL continues to worsen to 190s, ASCVD risk 30% with baseline 14% so rec starting pravastatin 40 - recheck in 2 mos for lfts (and recheck bp)  Diet review for nutrition referral? Yes ____  Not Indicated ____  HTN: recommended to pt that we change losartan 100 to losartan-hctz 100-12.5. Pt was considering wanting to switch to valsartan as her son is doing very well on it which I think is a good idea since a little stronger but she would only consent to be put on 80s - rec that she would likely need to be on the 320 mg dose and she refuses as the mg amount sounds to high despite repeated explanations that the mg dose of medications are not related nor relatively important but she still refuses.  Pt will only stay on losartan 100 for now.  Recheck and readdress at OV in 2 mos, cont to monitor BP at home.  Patient Instructions (the written plan) was given to the patient.  Medicare Attestation I have personally reviewed: The patient's medical and social  history Their use of alcohol, tobacco or illicit drugs Their current medications and supplements The patient's functional ability including ADLs,fall risks, home safety risks, cognitive, and hearing and visual impairment Diet and physical activities Evidence for depression or mood disorders  The patient's weight, height, BMI, and visual acuity have been recorded in the chart.  I have made referrals, counseling, and provided education to the patient based on review of the above and I have provided the patient with a written personalized care plan for preventive services.     Delman Cheadle, MD   11/17/2015    Orders Placed This Encounter  Procedures  . CBC  . Comprehensive metabolic panel    Order Specific Question:  Has the patient fasted?    Answer:  Yes  . Lipid panel    Order Specific Question:  Has the patient fasted?    Answer:  Yes  . TSH  . POCT urinalysis dipstick  . POCT glycosylated hemoglobin (Hb A1C)    Meds ordered this encounter  Medications  . magnesium gluconate (MAGONATE) 500 MG tablet    Sig: Take 500 mg by mouth 2 (  two) times daily.  . Calcium Carb-Cholecalciferol (CALCIUM + D3) 600-800 MG-UNIT TABS    Sig: Take by mouth.    Delman Cheadle, M.D.  [facility] [address] [place], [place] [address] [phone] phone [phone] fax  11/18/2015 5:58 PM

## 2015-11-17 NOTE — Patient Instructions (Addendum)
IF you received an x-ray today, you will receive an invoice from [facility]. Please contact [facility] at [phone] with questions or concerns regarding your invoice.   IF you received labwork today, you will receive an invoice from [employer]. Please contact [facility] at [phone] with questions or concerns regarding your invoice.   Our billing staff will not be able to assist you with questions regarding bills from these companies.  You will be contacted with the lab results as soon as they are available. The fastest way to get your results is to activate your My Chart account. Instructions are located on the last page of this paperwork. If you have not heard from [place] regarding the results in 2 weeks, please contact this office.    Managing Your High Blood Pressure Blood pressure is a measurement of how forceful your blood is pressing against the walls of the arteries. Arteries are muscular tubes within the circulatory system. Blood pressure does not stay the same. Blood pressure rises when you are active, excited, or nervous; and it lowers during sleep and relaxation. If the numbers measuring your blood pressure stay above normal most of the time, you are at risk for health problems. High blood pressure (hypertension) is a long-term (chronic) condition in which blood pressure is elevated. A blood pressure reading is recorded as two numbers, such as 120 over 80 (or 120/80). The first, higher number is called the systolic pressure. It is a measure of the pressure in your arteries as the heart beats. The second, lower number is called the diastolic pressure. It is a measure of the pressure in your arteries as the heart relaxes between beats.  Keeping your blood pressure in a normal range is important to your overall health and prevention of health problems, such as heart disease and stroke. When your blood pressure is uncontrolled, your heart has  to work harder than normal. High blood pressure is a very common condition in adults because blood pressure tends to rise with age. Men and women are equally likely to have hypertension but at different times in life. Before age 14, men are more likely to have hypertension. After 79 years of age, women are more likely to have it. Hypertension is especially common in African Americans. This condition often has no signs or symptoms. The cause of the condition is usually not known. Your caregiver can help you come up with a plan to keep your blood pressure in a normal, healthy range. BLOOD PRESSURE STAGES Blood pressure is classified into four stages: normal, prehypertension, stage 1, and stage 2. Your blood pressure reading will be used to determine what type of treatment, if any, is necessary. Appropriate treatment options are tied to these four stages:  Normal  Systolic pressure (mm Hg): below 120.  Diastolic pressure (mm Hg): below 80. Prehypertension  Systolic pressure (mm Hg): 120 to 139.  Diastolic pressure (mm Hg): 80 to 89. Stage1  Systolic pressure (mm Hg): 140 to 159.  Diastolic pressure (mm Hg): 90 to 99. Stage2  Systolic pressure (mm Hg): 160 or above.  Diastolic pressure (mm Hg): 100 or above. RISKS RELATED TO HIGH BLOOD PRESSURE Managing your blood pressure is an important responsibility. Uncontrolled high blood pressure can lead to:  A heart attack.  A stroke.  A weakened blood vessel (aneurysm).  Heart failure.  Kidney damage.  Eye damage.  Metabolic syndrome.  Memory and concentration problems. HOW TO MANAGE YOUR BLOOD PRESSURE Blood  pressure can be managed effectively with lifestyle changes and medicines (if needed). Your caregiver will help you come up with a plan to bring your blood pressure within a normal range. Your plan should include the following: Education  Read all information provided by your caregivers about how to control blood  pressure.  Educate yourself on the latest guidelines and treatment recommendations. New research is always being done to further define the risks and treatments for high blood pressure. Lifestylechanges  Control your weight.  Avoid smoking.  Stay physically active.  Reduce the amount of salt in your diet.  Reduce stress.  Control any chronic conditions, such as high cholesterol or diabetes.  Reduce your alcohol intake. Medicines  Several medicines (antihypertensive medicines) are available, if needed, to bring blood pressure within a normal range. Communication  Review all the medicines you take with your caregiver because there may be side effects or interactions.  Talk with your caregiver about your diet, exercise habits, and other lifestyle factors that may be contributing to high blood pressure.  See your caregiver regularly. Your caregiver can help you create and adjust your plan for managing high blood pressure. RECOMMENDATIONS FOR TREATMENT AND FOLLOW-UP  The following recommendations are based on current guidelines for managing high blood pressure in nonpregnant adults. Use these recommendations to identify the proper follow-up period or treatment option based on your blood pressure reading. You can discuss these options with your caregiver.  Systolic pressure of 123456 to [id number] or diastolic pressure of 80 to 89: Follow up with your caregiver as directed.  Systolic pressure of [id number] to 0000000 or diastolic pressure of 90 to 100: Follow up with your caregiver within 2 months.  Systolic pressure above 0000000 or diastolic pressure above [id number]: Follow up with your caregiver within 1 month.  Systolic pressure above [id number] or diastolic pressure above [id number]: Consider antihypertensive therapy; follow up with your caregiver within 1 week.  Systolic pressure above [id number] or diastolic pressure above 123456: Begin antihypertensive therapy; follow up with your caregiver within 1 week.   This information is  not intended to replace advice given to you by your health care provider. Make sure you discuss any questions you have with your health care provider.   Document Released: 02/13/2012 Document Reviewed: 02/13/2012 Elsevier Interactive Patient Education 2016 [place] for Metabolic Syndrome Metabolic syndrome is a disorder that includes at least three of these conditions:  Abdominal obesity.  Too much sugar in your blood.  High blood pressure.  Higher than normal amount of fat (lipids) in your blood.  Lower than normal level of "good" cholesterol (HDL). Following a healthy diet can help to keep metabolic syndrome under control. It can also help to prevent the development of conditions that are associated with metabolic syndrome, such as diabetes, heart disease, and stroke. Along with exercise, a healthy diet:  Helps to improve the way that the body uses insulin.  Promotes weight loss. A common goal for people with this condition is to lose at least 7 to 10 percent of their starting weight. WHAT DO I NEED TO KNOW ABOUT THIS DIET?  Use the glycemic index (GI) to plan your meals. The index tells you how quickly a food will raise your blood sugar. Choose foods that have low GI values. These foods take a longer time to raise blood sugar.  Keep track of how many calories you take in. Eating the right amount of calories will help your achieve a healthy weight.  You  may want to follow a Mediterranean diet. This diet includes lots of vegetables, lean meats or fish, whole grains, fruits, and healthy oils and fats. WHAT FOODS CAN I EAT? Grains Stone-ground whole wheat. Pumpernickel bread. Whole-grain bread, crackers, tortillas, cereal, and pasta. Unsweetened oatmeal.Bulgur.Barley.Quinoa.Brown rice or wild rice. Vegetables Lettuce. Spinach. Peas. Beets. Cauliflower. Cabbage. Broccoli. Carrots. Tomatoes. Squash. Eggplant. Herbs. Peppers. Onions. Cucumbers. Brussels sprouts. Sweet  potatoes. Yams. Beans. Lentils. Fruits Berries. Apples. Oranges. Grapes. Mango. Pomegranate. Kiwi. Cherries. Meats and Other Protein Sources Seafood and shellfish. Lean meats.Poultry. Tofu. Dairy Low-fat or fat-free dairy products, such as milk, yogurt, and cheese. Beverages Water. Low-fat milk. Milk alternatives, like soy milk or almond milk. Real fruit juice. Condiments Low-sugar or sugar-free ketchup, barbecue sauce, and mayonnaise. Mustard. Relish. Fats and Oils Avocado. Canola or olive oil. Nuts and nut butters.Seeds. The items listed above may not be a complete list of recommended foods or beverages. Contact your dietitian for more options.  WHAT FOODS ARE NOT RECOMMENDED? Red meat. Palm oil and coconut oil. Processed foods. Fried foods. Alcohol. Sweetened drinks, such as iced tea and soda. Sweets. Salty foods. The items listed above may not be a complete list of foods and beverages to avoid. Contact your dietitian for more information.   This information is not intended to replace advice given to you by your health care provider. Make sure you discuss any questions you have with your health care provider.   Document Released: 10/05/2014 Document Reviewed: 10/05/2014 Elsevier Interactive Patient Education [employer].

## 2015-11-18 ENCOUNTER — Telehealth: Payer: Self-pay

## 2015-11-18 LAB — LIPID PANEL
CHOL/HDL RATIO: 4.4 ratio (ref <=5.0)
Cholesterol: 285 mg/dL — ABNORMAL HIGH (ref 125–200)
HDL: 65 mg/dL (ref >=46)
LDL CALC: 195 mg/dL — AB (ref <130)
Triglycerides: 125 mg/dL (ref <150)
VLDL: 25 mg/dL (ref <30)

## 2015-11-18 LAB — COMPREHENSIVE METABOLIC PANEL
ALT: 13 U/L (ref 6–29)
AST: 16 U/L (ref 10–35)
Albumin: 4.2 g/dL (ref 3.6–5.1)
Alkaline Phosphatase: 44 U/L (ref 33–130)
BUN: 21 mg/dL (ref 7–25)
CALCIUM: 10.2 mg/dL (ref 8.6–10.4)
CHLORIDE: 106 mmol/L (ref 98–110)
CO2: 24 mmol/L (ref 20–31)
Creat: 1.09 mg/dL — ABNORMAL HIGH (ref 0.60–0.93)
GLUCOSE: 98 mg/dL (ref 65–99)
POTASSIUM: 4.1 mmol/L (ref 3.5–5.3)
Sodium: 141 mmol/L (ref 135–146)
Total Bilirubin: 0.7 mg/dL (ref 0.2–1.2)
Total Protein: 7 g/dL (ref 6.1–8.1)

## 2015-11-18 LAB — TSH: TSH: 1.36 m[IU]/L

## 2015-11-18 NOTE — Telephone Encounter (Signed)
Pt is needing to talk to dr Brigitte Pulse about which is best valsartan or what she is taking now and what are the different side effects  Best number [phone]

## 2015-11-18 NOTE — Telephone Encounter (Signed)
Called pt and discussed. Her son was on valsartan and it was working very well for him so she was wondering if she should be changed to valsartan from losartan 100- I advised that this would be a good idea since little more effective and her BP is above goal but she is unwilling to try the 320mg  dose - would only want to try 80.  Pt elects to cont on losartan 100 for now and work on tlc. Will call with bp update in sev mos and she may be willing to change to losartan-hctz 100-12.5 if still uncontrolled.

## 2015-11-18 NOTE — Telephone Encounter (Signed)
Patient is currently taking the Losartan and would like to know if it would be better for her to take the Valsartan and what the side effects are.

## 2015-11-21 ENCOUNTER — Ambulatory Visit (HOSPITAL_BASED_OUTPATIENT_CLINIC_OR_DEPARTMENT_OTHER): Payer: Medicare Other

## 2015-11-21 ENCOUNTER — Encounter: Payer: Self-pay | Admitting: Oncology

## 2015-11-21 ENCOUNTER — Ambulatory Visit (HOSPITAL_BASED_OUTPATIENT_CLINIC_OR_DEPARTMENT_OTHER): Payer: Medicare Other | Admitting: Oncology

## 2015-11-21 ENCOUNTER — Telehealth: Payer: Self-pay | Admitting: Oncology

## 2015-11-21 ENCOUNTER — Other Ambulatory Visit (HOSPITAL_BASED_OUTPATIENT_CLINIC_OR_DEPARTMENT_OTHER): Payer: Medicare Other

## 2015-11-21 VITALS — BP 170/81 | HR 66 | Temp 97.8°F | Resp 18 | Ht 68.0 in | Wt 183.3 lb

## 2015-11-21 DIAGNOSIS — C50512 Malignant neoplasm of lower-outer quadrant of left female breast: Secondary | ICD-10-CM | POA: Diagnosis not present

## 2015-11-21 DIAGNOSIS — M810 Age-related osteoporosis without current pathological fracture: Secondary | ICD-10-CM

## 2015-11-21 DIAGNOSIS — I10 Essential (primary) hypertension: Secondary | ICD-10-CM | POA: Diagnosis not present

## 2015-11-21 LAB — CBC WITH DIFFERENTIAL/PLATELET
BASO%: 1.2 % (ref 0.0–2.0)
Basophils Absolute: 0.1 10*3/uL (ref 0.0–0.1)
EOS%: 3.1 % (ref 0.0–7.0)
Eosinophils Absolute: 0.1 10*3/uL (ref 0.0–0.5)
HCT: 38 % (ref 34.8–46.6)
HGB: 12.2 g/dL (ref 11.6–15.9)
LYMPH%: 24.7 % (ref 14.0–49.7)
MCH: 29.3 pg (ref 25.1–34.0)
MCHC: 32.2 g/dL (ref 31.5–36.0)
MCV: 91.2 fL (ref 79.5–101.0)
MONO#: 0.4 10*3/uL (ref 0.1–0.9)
MONO%: 8.4 % (ref 0.0–14.0)
NEUT#: 2.9 10*3/uL (ref 1.5–6.5)
NEUT%: 62.6 % (ref 38.4–76.8)
Platelets: 214 10*3/uL (ref 145–400)
RBC: 4.17 10*6/uL (ref 3.70–5.45)
RDW: 13.3 % (ref 11.2–14.5)
WBC: 4.6 10*3/uL (ref 3.9–10.3)
lymph#: 1.1 10*3/uL (ref 0.9–3.3)

## 2015-11-21 LAB — COMPREHENSIVE METABOLIC PANEL
ALT: 17 U/L (ref 0–55)
AST: 18 U/L (ref 5–34)
Albumin: 4 g/dL (ref 3.5–5.0)
Alkaline Phosphatase: 47 U/L (ref 40–150)
Anion Gap: 8 mEq/L (ref 3–11)
BUN: 15.7 mg/dL (ref 7.0–26.0)
CO2: 24 mEq/L (ref 22–29)
Calcium: 10.2 mg/dL (ref 8.4–10.4)
Chloride: 110 mEq/L — ABNORMAL HIGH (ref 98–109)
Creatinine: 1 mg/dL (ref 0.6–1.1)
EGFR: 66 mL/min/{1.73_m2} — ABNORMAL LOW (ref >90)
Glucose: 85 mg/dl (ref 70–140)
Potassium: 3.9 mEq/L (ref 3.5–5.1)
Sodium: 142 mEq/L (ref 136–145)
Total Bilirubin: 0.65 mg/dL (ref 0.20–1.20)
Total Protein: 7.7 g/dL (ref 6.4–8.3)

## 2015-11-21 MED ORDER — DENOSUMAB 60 MG/ML ~~LOC~~ SOLN
60.0000 mg | Freq: Once | SUBCUTANEOUS | Status: AC
  Administered 2015-11-21: 60 mg via SUBCUTANEOUS
  Filled 2015-11-21: qty 1

## 2015-11-21 NOTE — Telephone Encounter (Signed)
Gave and printed appt sched and avs for pt for DEc °

## 2015-11-21 NOTE — Patient Instructions (Signed)
Denosumab injection  What is this medicine?  DENOSUMAB (den oh sue mab) slows bone breakdown. Prolia is used to treat osteoporosis in women after menopause and in men. Xgeva is used to prevent bone fractures and other bone problems caused by cancer bone metastases. Xgeva is also used to treat giant cell tumor of the bone.  This medicine may be used for other purposes; ask your health care provider or pharmacist if you have questions.  What should I tell my health care provider before I take this medicine?  They need to know if you have any of these conditions:  -dental disease  -eczema  -infection or history of infections  -kidney disease or on dialysis  -low blood calcium or vitamin D  -malabsorption syndrome  -scheduled to have surgery or tooth extraction  -taking medicine that contains denosumab  -thyroid or parathyroid disease  -an unusual reaction to denosumab, other medicines, foods, dyes, or preservatives  -pregnant or trying to get pregnant  -breast-feeding  How should I use this medicine?  This medicine is for injection under the skin. It is given by a health care professional in a hospital or clinic setting.  If you are getting Prolia, a special MedGuide will be given to you by the pharmacist with each prescription and refill. Be sure to read this information carefully each time.  For Prolia, talk to your pediatrician regarding the use of this medicine in children. Special care may be needed. For Xgeva, talk to your pediatrician regarding the use of this medicine in children. While this drug may be prescribed for children as young as 13 years for selected conditions, precautions do apply.  Overdosage: If you think you have taken too much of this medicine contact a poison control center or emergency room at once.  NOTE: This medicine is only for you. Do not share this medicine with others.  What if I miss a dose?  It is important not to miss your dose. Call your doctor or health care professional if you are  unable to keep an appointment.  What may interact with this medicine?  Do not take this medicine with any of the following medications:  -other medicines containing denosumab  This medicine may also interact with the following medications:  -medicines that suppress the immune system  -medicines that treat cancer  -steroid medicines like prednisone or cortisone  This list may not describe all possible interactions. Give your health care provider a list of all the medicines, herbs, non-prescription drugs, or dietary supplements you use. Also tell them if you smoke, drink alcohol, or use illegal drugs. Some items may interact with your medicine.  What should I watch for while using this medicine?  Visit your doctor or health care professional for regular checks on your progress. Your doctor or health care professional may order blood tests and other tests to see how you are doing.  Call your doctor or health care professional if you get a cold or other infection while receiving this medicine. Do not treat yourself. This medicine may decrease your body's ability to fight infection.  You should make sure you get enough calcium and vitamin D while you are taking this medicine, unless your doctor tells you not to. Discuss the foods you eat and the vitamins you take with your health care professional.  See your dentist regularly. Brush and floss your teeth as directed. Before you have any dental work done, tell your dentist you are receiving this medicine.  Do   not become pregnant while taking this medicine or for 5 months after stopping it. Women should inform their doctor if they wish to become pregnant or think they might be pregnant. There is a potential for serious side effects to an unborn child. Talk to your health care professional or pharmacist for more information.  What side effects may I notice from receiving this medicine?  Side effects that you should report to your doctor or health care professional as soon as  possible:  -allergic reactions like skin rash, itching or hives, swelling of the face, lips, or tongue  -breathing problems  -chest pain  -fast, irregular heartbeat  -feeling faint or lightheaded, falls  -fever, chills, or any other sign of infection  -muscle spasms, tightening, or twitches  -numbness or tingling  -skin blisters or bumps, or is dry, peels, or red  -slow healing or unexplained pain in the mouth or jaw  -unusual bleeding or bruising  Side effects that usually do not require medical attention (Report these to your doctor or health care professional if they continue or are bothersome.):  -muscle pain  -stomach upset, gas  This list may not describe all possible side effects. Call your doctor for medical advice about side effects. You may report side effects to FDA at 1-800-FDA-1088.  Where should I keep my medicine?  This medicine is only given in a clinic, doctor's office, or other health care setting and will not be stored at home.  NOTE: This sheet is a summary. It may not cover all possible information. If you have questions about this medicine, talk to your doctor, pharmacist, or health care provider.      2016, Elsevier/Gold Standard. (2011-11-19 12:37:47)

## 2015-11-21 NOTE — Progress Notes (Signed)
[facility]  Telephone:([phone]) [phone] Fax:(336) [phone]     ID: ETOY MCDONNELL DOB: 1934/07/12  MR#: [id number]  HWE#:[id number]  Patient Care Team: Barton Fanny, MD as PCP - General (Family Medicine) Clarene Essex, MD as Consulting Physician (Gastroenterology) Autumn Messing III, MD as Consulting Physician (General Surgery) Chauncey Cruel, MD as Consulting Physician (Oncology) Eppie Gibson, MD as Attending Physician (Radiation Oncology) Mauro Kaufmann, RN as Registered Nurse Rockwell Germany, RN as Registered Nurse Holley Bouche, NP as Nurse Practitioner (Nurse Practitioner) Sylvan Cheese, NP as Nurse Practitioner (Hematology and Oncology) PCP: Ellsworth Lennox, MD OTHER MD:  CHIEF COMPLAINT: Estrogen receptor positive breast cancer  CURRENT TREATMENT: Anastrozole   BREAST CANCER HISTORY: From the original intake note:  Adora noted a palpable mass in the left breast and brought it to her primary care physician's attention. On 09/27/2014 the patient underwent bilateral diagnostic mammography with tomosynthesis and left breast ultrasonography. The breast density was category B. In the outer left breast area there was an irregularly shaped mass not associated with suspicious microcalcifications or distortion. The previously described large of fibroadenolipoma in the right breast was unchanged. On physical exam there was a firm palpable mass at the 3:30 position in the left breast 2 cm prone the nipple which, on targeted ultrasound, was irregular, hypoechoic, and measured 2.0 cm. Ultrasound of the left axilla was negative.  Biopsy of this mass 10/05/2014 showed ([id number]) an invasive ductal carcinoma him a grade 1, estrogen receptor 90% positive, progesterone receptor 90% positive, both with strong staining intensity, with an MIB-1 of 5%, and no HER-2 amplification, the signals ratio being 1.06 and the number per cell 1.70.  The patient's subsequent  history is as detailed below  INTERVAL HISTORY: [place] returns today for follow-up of her estrogen receptor positive breast cancer. She continues on anastrozole, which she is tolerating well. Denies hot flashes. She does not have vaginal dryness issues. She does not report any arthralgias or myalgias. She is obtaining the drug at $4 per month.  REVIEW OF SYSTEMS: She is not exercising regularly except by walking. [facility] A detailed review of systems today was entirely stable    . PAST MEDICAL HISTORY: Past Medical History  Diagnosis Date  . Hypertension   . Anxiety   . PUD (peptic ulcer disease)   . GERD (gastroesophageal reflux disease)   . Breast cancer of lower-outer quadrant of left female breast ([facility]) 10/11/2014  . Breast cancer ([facility])   . PONV (postoperative nausea and vomiting)   . Stone, kidney     PAST SURGICAL HISTORY: Past Surgical History  Procedure Laterality Date  . Breast lumpectomy with sentinel lymph node biopsy Left 10/22/2014    Procedure: BREAST LUMPECTOMY WITH SENTINEL LYMPH NODE BIOPSY;  Surgeon: Autumn Messing III, MD;  Location: [facility];  Service: General;  Laterality: Left;  . Re-excision of breast cancer,superior margins Left 11/04/2014    Procedure: RE-EXCISION OF LEFT BREAST INFERIOR MARGINS;  Surgeon: Autumn Messing III, MD;  Location: [facility];  Service: General;  Laterality: Left;    FAMILY HISTORY Family History  Problem Relation Age of Onset  . Pancreatic cancer Mother     pancreastic  . Stroke Father   . Hypertension Brother   . Hypertension Sister   . Hypertension Brother   . Heart disease Brother   . Hypertension Sister    the patient's father died  following a stroke at age 61. The patient's mother died from pancreatic cancer the age of 42. The patient had 5 brothers, 2 sisters. There is no history of breast or ovarian cancer in the family to the patient's knowledge   GYNECOLOGIC  HISTORY:  No LMP recorded. Patient is postmenopausal.  menarche age 84, first live birth age 67, the patient is [place] P4. She went through the change of life in her late 33s. She took hormone replacement for approximately 3 years. She also took oral contraceptives for a few years remotely, with no complications.   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 Her husband Jenny Reichmann is a Horticulturist, commercial. Daughter [name] lives in  [place], near [place], where she Biomedical engineer. Son [name] lives in [place] and works in Building control surveyor. Son [name] also lives in [place]. He is a Environmental education officer with [employer]. Daughter [name] lives in [place] and teaches in [place] The patient has 11 grandchildren. She attends a local [employer] DIRECTIVES:  the patient has named her son [name] as healthcare power of attorney. He can be reached at [phone].    HEALTH MAINTENANCE: Social History  Substance Use Topics  . Smoking status: Never Smoker   . Smokeless tobacco: Never Used  . Alcohol Use: No     Colonoscopy:  PAP:  Bone density:  Lipid panel:  Allergies  Allergen Reactions  . Lisinopril     D/c lisinopril due to cough    Current Outpatient Prescriptions  Medication Sig Dispense Refill  . anastrozole (ARIMIDEX) 1 MG tablet Take 1 tablet (1 mg total) by mouth daily. 90 tablet 4  . Ascorbic Acid (VITAMIN C) 1000 MG tablet Take 1,000 mg by mouth daily.    Marland Kitchen aspirin 81 MG tablet Take 81 mg by mouth 3 (three) times a week.     . Calcium Carb-Cholecalciferol (CALCIUM + D3) 600-800 MG-UNIT TABS Take by mouth.    . cholecalciferol (VITAMIN D) 1000 UNITS tablet Take 1,000 Units by mouth daily. Over the counter    . co-enzyme Q-10 30 MG capsule Take 30 mg by mouth 3 (three) times daily.    . fish oil-omega-3 fatty acids 1000 MG capsule Take 1 g by mouth  daily.    Marland Kitchen GARLIC PO Take by mouth daily.    Marland Kitchen losartan (COZAAR) 50 MG tablet     . magnesium gluconate (MAGONATE) 500 MG tablet Take 500 mg by mouth 2 (two) times daily.    . Multiple Vitamin (MULTIVITAMIN) tablet Take 1 tablet by mouth daily.     No current facility-administered medications for this visit.    OBJECTIVE: older African-American woman who appears stated age 18 Vitals:   11/21/15 1321 11/21/15 1322  BP: 203/87 170/81  Pulse: 66   Temp: 97.8 F (36.6 C)   Resp: 18      Body mass index is 27.88 kg/(m^2).    ECOG FS:0 - Asymptomatic  Sclerae unicteric, pupils round and equal Oropharynx clear, slightly dry No cervical or supraclavicular adenopathy Lungs no rales or rhonchi Heart regular rate and rhythm Abd soft, nontender, positive bowel sounds MSK mild kyphosis but no focal spinal tenderness, no upper extremity lymphedema Neuro: nonfocal, well oriented, appropriate affect Breasts: Bilateral breasts without palpable masses or nipple discharge.    LAB RESULTS:  CMP     Component Value Date/Time  NA 142 11/21/2015 1253   NA 141 11/17/2015 1346   K 3.9 11/21/2015 1253   K 4.1 11/17/2015 1346   CL 106 11/17/2015 1346   CO2 24 11/21/2015 1253   CO2 24 11/17/2015 1346   GLUCOSE 85 11/21/2015 1253   GLUCOSE 98 11/17/2015 1346   BUN 15.7 11/21/2015 1253   BUN 21 11/17/2015 1346   CREATININE 1.0 11/21/2015 1253   CREATININE 1.09* 11/17/2015 1346   CREATININE 0.97 11/04/2014 0652   CALCIUM 10.2 11/21/2015 1253   CALCIUM 10.2 11/17/2015 1346   PROT 7.7 11/21/2015 1253   PROT 7.0 11/17/2015 1346   ALBUMIN 4.0 11/21/2015 1253   ALBUMIN 4.2 11/17/2015 1346   AST 18 11/21/2015 1253   AST 16 11/17/2015 1346   ALT 17 11/21/2015 1253   ALT 13 11/17/2015 1346   ALKPHOS 47 11/21/2015 1253   ALKPHOS 44 11/17/2015 1346   BILITOT 0.65 11/21/2015 1253   BILITOT 0.7 11/17/2015 1346   GFRNONAA 55* 11/04/2014 0652   GFRNONAA 63 09/09/2014 1150   GFRAA >60  11/04/2014 0652   GFRAA 72 09/09/2014 1150    INo results found for: SPEP, UPEP  Lab Results  Component Value Date   WBC 4.6 11/21/2015   NEUTROABS 2.9 11/21/2015   HGB 12.2 11/21/2015   HCT 38.0 11/21/2015   MCV 91.2 11/21/2015   PLT 214 11/21/2015      Chemistry      Component Value Date/Time   NA 142 11/21/2015 1253   NA 141 11/17/2015 1346   K 3.9 11/21/2015 1253   K 4.1 11/17/2015 1346   CL 106 11/17/2015 1346   CO2 24 11/21/2015 1253   CO2 24 11/17/2015 1346   BUN 15.7 11/21/2015 1253   BUN 21 11/17/2015 1346   CREATININE 1.0 11/21/2015 1253   CREATININE 1.09* 11/17/2015 1346   CREATININE 0.97 11/04/2014 0652      Component Value Date/Time   CALCIUM 10.2 11/21/2015 1253   CALCIUM 10.2 11/17/2015 1346   ALKPHOS 47 11/21/2015 1253   ALKPHOS 44 11/17/2015 1346   AST 18 11/21/2015 1253   AST 16 11/17/2015 1346   ALT 17 11/21/2015 1253   ALT 13 11/17/2015 1346   BILITOT 0.65 11/21/2015 1253   BILITOT 0.7 11/17/2015 1346       No results found for: LABCA2  No components found for: LABCA125  No results for input(s): INR in the last 168 hours.  Urinalysis    Component Value Date/Time   COLORURINE YELLOW 02/01/2011 1158   APPEARANCEUR CLEAR 02/01/2011 1158   LABSPEC 1.008 02/01/2011 1158   PHURINE 8.0 02/01/2011 1158   GLUCOSEU NEGATIVE 02/01/2011 1158   HGBUR NEGATIVE 02/01/2011 1158   BILIRUBINUR negative 11/17/2015 1520   BILIRUBINUR neg 09/14/2014 1308   BILIRUBINUR NEGATIVE 02/01/2011 1158   KETONESUR negative 11/17/2015 1520   KETONESUR NEGATIVE 02/01/2011 1158   PROTEINUR trace* 11/17/2015 1520   PROTEINUR neg 09/14/2014 1308   PROTEINUR NEGATIVE 02/01/2011 1158   UROBILINOGEN 0.2 11/17/2015 1520   UROBILINOGEN 0.2 02/01/2011 1158   NITRITE Negative 11/17/2015 1520   NITRITE neg 09/14/2014 1308   NITRITE NEGATIVE 02/01/2011 1158   LEUKOCYTESUR small (1+)* 11/17/2015 1520    STUDIES: CLINICAL DATA: Postmenopausal. Baseline exam. The  patient takes vitamin-D and calcium supplements. History of estrogen replacement therapy in the past.  EXAM: DUAL X-RAY ABSORPTIOMETRY (DXA) FOR BONE MINERAL DENSITY  FINDINGS: AP LUMBAR SPINE L3, L4  Bone Mineral Density (BMD): 0.541 g/cm2  Young Adult  T-Score: -5.1  Z-Score: -3.1  Left FEMUR neck  Bone Mineral Density (BMD): 0.569 g/cm2  Young Adult T-Score: -2.5  Z-Score: -1.0  ASSESSMENT: Patient's diagnostic category is osteoporosis by WHO Criteria.  FRACTURE RISK: Increased  ASSESSMENT: 80 y.o.  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  (2) radiation 12/21/2014-01/11/2015: Left Breast / 42.56 Gy in 16 fractions   (3) anastrozole started September 2016  (a) bone density 09/27/2014 shows osteoporosis  PLAN: Shaquna is tolerating anastrozole well, and the plan will be to continue that for a total of 5 years. Mammogram in April 2017 showed no evidence of malignancy. Repeat mammogram in one year.  For her osteoporosis she will continue on to know Denosumab every 6 months. Injection given today. The plan is to continue prolia for a total of 2 years and then reassess with a repeat bone density scan.  Of note, the patient's blood pressure is elevated today. Patient states that her blood pressure is normally elevated when she comes to the doctor's office. She checks her blood pressure home and normally her systolic is in the 174J over 70s. She is currently on losartan 50 mg daily. I have encouraged her to follow-up with her primary care provider  regarding her hypertension.  The patient will have a return visit with [place] in 6 months.  She has a good understanding of this plan. She agrees with it. She will let us know if there are any problems before her next visit.  Mikey Bussing, NP   11/21/2015 4:06 PM Medical Oncology and Hematology [facility] [address] [place], [place] [address] Tel. [phone]    Fax. [phone]

## 2015-11-22 ENCOUNTER — Telehealth: Payer: Self-pay

## 2015-11-22 NOTE — Telephone Encounter (Signed)
Pt is looking for lab results and would like for [place] to put the results on my chart  Best number [phone]

## 2015-11-22 NOTE — Telephone Encounter (Signed)
Results released

## 2015-11-25 MED ORDER — PRAVASTATIN SODIUM 40 MG PO TABS: 40.0000 mg | ORAL_TABLET | Freq: Every day | ORAL | Status: DC

## 2016-02-20 DIAGNOSIS — C50412 Malignant neoplasm of upper-outer quadrant of left female breast: Secondary | ICD-10-CM | POA: Diagnosis not present

## 2016-03-18 ENCOUNTER — Other Ambulatory Visit: Payer: Self-pay | Admitting: Oncology

## 2016-05-21 ENCOUNTER — Other Ambulatory Visit (HOSPITAL_BASED_OUTPATIENT_CLINIC_OR_DEPARTMENT_OTHER): Payer: Medicare Other

## 2016-05-21 ENCOUNTER — Ambulatory Visit (HOSPITAL_BASED_OUTPATIENT_CLINIC_OR_DEPARTMENT_OTHER): Payer: Medicare Other

## 2016-05-21 ENCOUNTER — Ambulatory Visit (HOSPITAL_BASED_OUTPATIENT_CLINIC_OR_DEPARTMENT_OTHER): Payer: Medicare Other | Admitting: Oncology

## 2016-05-21 VITALS — BP 199/86 | HR 66 | Temp 97.8°F | Resp 18 | Ht 65.0 in | Wt 179.8 lb

## 2016-05-21 DIAGNOSIS — C50512 Malignant neoplasm of lower-outer quadrant of left female breast: Secondary | ICD-10-CM

## 2016-05-21 DIAGNOSIS — M818 Other osteoporosis without current pathological fracture: Secondary | ICD-10-CM

## 2016-05-21 DIAGNOSIS — I10 Essential (primary) hypertension: Secondary | ICD-10-CM

## 2016-05-21 DIAGNOSIS — Z170 Estrogen receptor positive status [ER+]: Secondary | ICD-10-CM | POA: Diagnosis not present

## 2016-05-21 DIAGNOSIS — M810 Age-related osteoporosis without current pathological fracture: Secondary | ICD-10-CM

## 2016-05-21 LAB — COMPREHENSIVE METABOLIC PANEL
ALT: 18 U/L (ref 0–55)
AST: 20 U/L (ref 5–34)
Albumin: 3.7 g/dL (ref 3.5–5.0)
Alkaline Phosphatase: 51 U/L (ref 40–150)
Anion Gap: 9 mEq/L (ref 3–11)
BUN: 14.7 mg/dL (ref 7.0–26.0)
CO2: 24 meq/L (ref 22–29)
Calcium: 10.2 mg/dL (ref 8.4–10.4)
Chloride: 110 mEq/L — ABNORMAL HIGH (ref 98–109)
Creatinine: 1 mg/dL (ref 0.6–1.1)
EGFR: 62 mL/min/{1.73_m2} — AB (ref >90)
GLUCOSE: 89 mg/dL (ref 70–140)
POTASSIUM: 3.8 meq/L (ref 3.5–5.1)
SODIUM: 143 meq/L (ref 136–145)
Total Bilirubin: 0.85 mg/dL (ref 0.20–1.20)
Total Protein: 7.6 g/dL (ref 6.4–8.3)

## 2016-05-21 LAB — CBC WITH DIFFERENTIAL/PLATELET
BASO%: 1.3 % (ref 0.0–2.0)
Basophils Absolute: 0.1 10*3/uL (ref 0.0–0.1)
EOS%: 2 % (ref 0.0–7.0)
Eosinophils Absolute: 0.1 10*3/uL (ref 0.0–0.5)
HCT: 37.9 % (ref 34.8–46.6)
HGB: 12.1 g/dL (ref 11.6–15.9)
LYMPH#: 1 10*3/uL (ref 0.9–3.3)
LYMPH%: 20.6 % (ref 14.0–49.7)
MCH: 29.2 pg (ref 25.1–34.0)
MCHC: 32 g/dL (ref 31.5–36.0)
MCV: 91.4 fL (ref 79.5–101.0)
MONO#: 0.4 10*3/uL (ref 0.1–0.9)
MONO%: 8.3 % (ref 0.0–14.0)
NEUT%: 67.8 % (ref 38.4–76.8)
NEUTROS ABS: 3.4 10*3/uL (ref 1.5–6.5)
Platelets: 213 10*3/uL (ref 145–400)
RBC: 4.15 10*6/uL (ref 3.70–5.45)
RDW: 13.3 % (ref 11.2–14.5)
WBC: 5 10*3/uL (ref 3.9–10.3)

## 2016-05-21 MED ORDER — DENOSUMAB 60 MG/ML ~~LOC~~ SOLN
60.0000 mg | Freq: Once | SUBCUTANEOUS | Status: AC
  Administered 2016-05-21: 60 mg via SUBCUTANEOUS
  Filled 2016-05-21: qty 1

## 2016-05-21 NOTE — Progress Notes (Signed)
[name]  [phone] [phone]     ID: [name] DOB: [birth year]  MR#: [id number]  KLK#:[id number]  Patient Care Team: Barton Fanny, MD as PCP - General (Family Medicine) Clarene Essex, MD as Consulting Physician (Gastroenterology) Autumn Messing III, MD as Consulting Physician (General Surgery) Chauncey Cruel, MD as Consulting Physician (Oncology) Eppie Gibson, MD as Attending Physician (Radiation Oncology) Mauro Kaufmann, RN as Registered Nurse Rockwell Germany, RN as Registered Nurse Holley Bouche, NP as Nurse Practitioner (Nurse Practitioner) Sylvan Cheese, NP as Nurse Practitioner (Hematology and Oncology) PCP: Ellsworth Lennox, MD OTHER MD:  CHIEF COMPLAINT: Estrogen receptor positive breast cancer  CURRENT TREATMENT: Anastrozole, denosumab/Prolia   BREAST CANCER HISTORY: From the original intake note:  Shelvie noted a palpable mass in the left breast and brought it to her primary care physician's attention. On 09/27/2014 the patient underwent bilateral diagnostic mammography with tomosynthesis and left breast ultrasonography. The breast density was category B. In the outer left breast area there was an irregularly shaped mass not associated with suspicious microcalcifications or distortion. The previously described large of fibroadenolipoma in the right breast was unchanged. On physical exam there was a firm palpable mass at the 3:30 position in the left breast 2 cm prone the nipple which, on targeted ultrasound, was irregular, hypoechoic, and measured 2.0 cm. Ultrasound of the left axilla was negative.  Biopsy of this mass 10/05/2014 showed ([id number]) an invasive ductal carcinoma him a grade 1, estrogen receptor 90% positive, progesterone receptor 90% positive, both with strong staining intensity, with an MIB-1 of 5%, and no HER-2 amplification, the signals ratio being 1.06 and the number per cell 1.70.  The  patient's subsequent history is as detailed below  INTERVAL HISTORY: [name] returns today for follow-up of her early stage breast cancer. She is tolerating anastrozole well.  Hot flashes and vaginal dryness are not a major issue. She never developed the arthralgias or myalgias that many patients can experience on this medication. She obtains it at a good price.  Marland Kitchen REVIEW OF SYSTEMS: She is BC doing some tanning for Christmas and is planning a trip to [place] to visit family. She has occasional problems with heartburn. Otherwise a detailed review of systems today was stable    . PAST MEDICAL HISTORY: Past Medical History:  Diagnosis Date  . Anxiety   . Breast cancer ([place])   . Breast cancer of lower-outer quadrant of left female breast ([place]) 10/11/2014  . GERD (gastroesophageal reflux disease)   . Hypertension   . PONV (postoperative nausea and vomiting)   . PUD (peptic ulcer disease)   . Stone, kidney     PAST SURGICAL HISTORY: Past Surgical History:  Procedure Laterality Date  . BREAST LUMPECTOMY WITH SENTINEL LYMPH NODE BIOPSY Left 10/22/2014   Procedure: BREAST LUMPECTOMY WITH SENTINEL LYMPH NODE BIOPSY;  Surgeon: Autumn Messing III, MD;  Location: [facility];  Service: General;  Laterality: Left;  . RE-EXCISION OF BREAST CANCER,SUPERIOR MARGINS Left 11/04/2014   Procedure: RE-EXCISION OF LEFT BREAST INFERIOR MARGINS;  Surgeon: Autumn Messing III, MD;  Location: [facility];  Service: General;  Laterality: Left;    FAMILY HISTORY Family History  Problem Relation Age of Onset  . Pancreatic cancer Mother     pancreastic  . Stroke Father   . Hypertension Brother   . Hypertension Sister   . Hypertension Brother   . Heart disease Brother   . Hypertension Sister  the patient's father died following a stroke at age 18. The patient's mother died from pancreatic cancer the age of 75. The patient had 5 brothers, 2 sisters. There is no history of breast or ovarian cancer in the family to  the patient's knowledge   GYNECOLOGIC HISTORY:  No LMP recorded. Patient is postmenopausal.  menarche age 81, first live birth age 63, the patient is [place] P4. She went through the change of life in her late 34s. She took hormone replacement for approximately 3 years. She also took oral contraceptives for a few years remotely, with no complications.   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 Her husband Jenny Reichmann is a Horticulturist, commercial. Daughter [name] lives in  [place], near [place], where she Biomedical engineer. Son [name] lives in [place] and works in Building control surveyor. Son [name] also lives in [place]. He is a Environmental education officer with [employer]. Daughter [name] lives in [place] and teaches in [place] ([place]). The patient has 11 grandchildren. She attends a local [employer] DIRECTIVES:  the patient has named her son [name] as healthcare power of attorney. He can be reached at [phone].    HEALTH MAINTENANCE: Social History  Substance Use Topics  . Smoking status: Never Smoker  . Smokeless tobacco: Never Used  . Alcohol use No     Colonoscopy:  PAP:  Bone density:  Lipid panel:  Allergies  Allergen Reactions  . Lisinopril     D/c lisinopril due to cough    Current Outpatient Prescriptions  Medication Sig Dispense Refill  . anastrozole (ARIMIDEX) 1 MG tablet TAKE ONE TABLET BY MOUTH ONCE DAILY 90 tablet 4  . Ascorbic Acid (VITAMIN C) 1000 MG tablet Take 1,000 mg by mouth daily.    Marland Kitchen aspirin 81 MG tablet Take 81 mg by mouth 3 (three) times a week.     . Calcium Carb-Cholecalciferol (CALCIUM + D3) 600-800 MG-UNIT TABS Take by mouth.    . cholecalciferol (VITAMIN D) 1000 UNITS tablet Take 1,000 Units by mouth daily. Over the counter    . co-enzyme Q-10 30 MG capsule Take 30 mg by mouth 3 (three) times daily.    . fish oil-omega-3 fatty acids 1000  MG capsule Take 1 g by mouth daily.    Marland Kitchen GARLIC PO Take by mouth daily.    Marland Kitchen losartan (COZAAR) 50 MG tablet     . magnesium gluconate (MAGONATE) 500 MG tablet Take 500 mg by mouth 2 (two) times daily.    . Multiple Vitamin (MULTIVITAMIN) tablet Take 1 tablet by mouth daily.    . pravastatin (PRAVACHOL) 40 MG tablet Take 1 tablet (40 mg total) by mouth daily. 90 tablet 0   No current facility-administered medications for this visit.     OBJECTIVE: older African-American woman In no acute distress Vitals:   05/21/16 1047  BP: (!) 199/86  Pulse: 66  Resp: 18  Temp: 97.8 F (36.6 C)     Body mass index is 29.92 kg/m.    ECOG FS:0 - Asymptomatic  Sclerae unicteric, EOMs intact Oropharynx clear and moist No cervical or supraclavicular adenopathy Lungs no rales or rhonchi Heart regular rate and rhythm Abd soft, nontender, positive bowel sounds MSK no focal spinal tenderness, no upper extremity lymphedema Neuro: nonfocal, well oriented, appropriate affect Breasts: I do not palpate any suspicious masses in either breast. Both axillae are benign.  LAB RESULTS:  CMP     Component Value Date/Time   NA 142 11/21/2015 1253   K 3.9 11/21/2015 1253   CL 106 11/17/2015 1346   CO2 24 11/21/2015 1253   GLUCOSE 85 11/21/2015 1253   BUN 15.7 11/21/2015 1253   CREATININE 1.0 11/21/2015 1253   CALCIUM 10.2 11/21/2015 1253   PROT 7.7 11/21/2015 1253   ALBUMIN 4.0 11/21/2015 1253   AST 18 11/21/2015 1253   ALT 17 11/21/2015 1253   ALKPHOS 47 11/21/2015 1253   BILITOT 0.65 11/21/2015 1253   GFRNONAA 55 (L) 11/04/2014 0652   GFRNONAA 63 09/09/2014 1150   GFRAA >60 11/04/2014 0652   GFRAA 72 09/09/2014 1150    INo results found for: SPEP, UPEP  Lab Results  Component Value Date   WBC 5.0 05/21/2016   NEUTROABS 3.4 05/21/2016   HGB 12.1 05/21/2016   HCT 37.9 05/21/2016   MCV 91.4 05/21/2016   PLT 213 05/21/2016      Chemistry      Component Value Date/Time   NA 142  11/21/2015 1253   K 3.9 11/21/2015 1253   CL 106 11/17/2015 1346   CO2 24 11/21/2015 1253   BUN 15.7 11/21/2015 1253   CREATININE 1.0 11/21/2015 1253      Component Value Date/Time   CALCIUM 10.2 11/21/2015 1253   ALKPHOS 47 11/21/2015 1253   AST 18 11/21/2015 1253   ALT 17 11/21/2015 1253   BILITOT 0.65 11/21/2015 1253       No results found for: LABCA2  No components found for: LABCA125  No results for input(s): INR in the last 168 hours.  Urinalysis    Component Value Date/Time   COLORURINE YELLOW 02/01/2011 1158   APPEARANCEUR CLEAR 02/01/2011 1158   LABSPEC 1.008 02/01/2011 1158   PHURINE 8.0 02/01/2011 1158   GLUCOSEU NEGATIVE 02/01/2011 1158   HGBUR NEGATIVE 02/01/2011 1158   BILIRUBINUR negative 11/17/2015 1520   BILIRUBINUR neg 09/14/2014 1308   KETONESUR negative 11/17/2015 1520   KETONESUR NEGATIVE 02/01/2011 1158   PROTEINUR trace (A) 11/17/2015 1520   PROTEINUR neg 09/14/2014 1308   PROTEINUR NEGATIVE 02/01/2011 1158   UROBILINOGEN 0.2 11/17/2015 1520   UROBILINOGEN 0.2 02/01/2011 1158   NITRITE Negative 11/17/2015 1520   NITRITE neg 09/14/2014 1308   NITRITE NEGATIVE 02/01/2011 1158   LEUKOCYTESUR small (1+) (A) 11/17/2015 1520    STUDIES: No results found.   ASSESSMENT: 81 y.o.   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  (2) radiation 12/21/2014-01/11/2015: Left Breast / 42.56 Gy in 16 fractions   (3) anastrozole started September 2016  (a) bone density 09/27/2014 shows osteoporosis  (b) denosumab/Prolia  started 05/19/2015, repeated every 6 months.  PLAN: Brennen is now a year and a half out from definitive surgery for her breast cancer with no evidence of disease recurrence. This is very favorable.  Her blood pressure was a bit on the high side today. She tells me it usually is normal when she sees her surgeon or her primary care physician. She wanted to know if taking certain several supplements which  she showed me it would lower her blood pressure and I suggested that was very unlikely. She is going to continue to work on this issue with her primary care physician.  She is tolerating the denosumab/Prolia well. She will receive a shot today, and the next one 6 months from now. She will be due for a repeat bone density April 2018.  She will see me again next June. From that point we will start seeing her on a once a year basis.  Chauncey Cruel, MD   05/21/2016 11:02 AM Medical Oncology and Hematology [facility] [address] [place], [place] [address] Tel. [phone]    Fax. [phone] is receiving erythropoietin on the right 33

## 2016-05-21 NOTE — Patient Instructions (Signed)
Denosumab injection  What is this medicine?  DENOSUMAB (den oh sue mab) slows bone breakdown. Prolia is used to treat osteoporosis in women after menopause and in men. Xgeva is used to prevent bone fractures and other bone problems caused by cancer bone metastases. Xgeva is also used to treat giant cell tumor of the bone.  This medicine may be used for other purposes; ask your health care provider or pharmacist if you have questions.  What should I tell my health care provider before I take this medicine?  They need to know if you have any of these conditions:  -dental disease  -eczema  -infection or history of infections  -kidney disease or on dialysis  -low blood calcium or vitamin D  -malabsorption syndrome  -scheduled to have surgery or tooth extraction  -taking medicine that contains denosumab  -thyroid or parathyroid disease  -an unusual reaction to denosumab, other medicines, foods, dyes, or preservatives  -pregnant or trying to get pregnant  -breast-feeding  How should I use this medicine?  This medicine is for injection under the skin. It is given by a health care professional in a hospital or clinic setting.  If you are getting Prolia, a special MedGuide will be given to you by the pharmacist with each prescription and refill. Be sure to read this information carefully each time.  For Prolia, talk to your pediatrician regarding the use of this medicine in children. Special care may be needed. For Xgeva, talk to your pediatrician regarding the use of this medicine in children. While this drug may be prescribed for children as young as 13 years for selected conditions, precautions do apply.  Overdosage: If you think you have taken too much of this medicine contact a poison control center or emergency room at once.  NOTE: This medicine is only for you. Do not share this medicine with others.  What if I miss a dose?  It is important not to miss your dose. Call your doctor or health care professional if you are  unable to keep an appointment.  What may interact with this medicine?  Do not take this medicine with any of the following medications:  -other medicines containing denosumab  This medicine may also interact with the following medications:  -medicines that suppress the immune system  -medicines that treat cancer  -steroid medicines like prednisone or cortisone  This list may not describe all possible interactions. Give your health care provider a list of all the medicines, herbs, non-prescription drugs, or dietary supplements you use. Also tell them if you smoke, drink alcohol, or use illegal drugs. Some items may interact with your medicine.  What should I watch for while using this medicine?  Visit your doctor or health care professional for regular checks on your progress. Your doctor or health care professional may order blood tests and other tests to see how you are doing.  Call your doctor or health care professional if you get a cold or other infection while receiving this medicine. Do not treat yourself. This medicine may decrease your body's ability to fight infection.  You should make sure you get enough calcium and vitamin D while you are taking this medicine, unless your doctor tells you not to. Discuss the foods you eat and the vitamins you take with your health care professional.  See your dentist regularly. Brush and floss your teeth as directed. Before you have any dental work done, tell your dentist you are receiving this medicine.  Do   not become pregnant while taking this medicine or for 5 months after stopping it. Women should inform their doctor if they wish to become pregnant or think they might be pregnant. There is a potential for serious side effects to an unborn child. Talk to your health care professional or pharmacist for more information.  What side effects may I notice from receiving this medicine?  Side effects that you should report to your doctor or health care professional as soon as  possible:  -allergic reactions like skin rash, itching or hives, swelling of the face, lips, or tongue  -breathing problems  -chest pain  -fast, irregular heartbeat  -feeling faint or lightheaded, falls  -fever, chills, or any other sign of infection  -muscle spasms, tightening, or twitches  -numbness or tingling  -skin blisters or bumps, or is dry, peels, or red  -slow healing or unexplained pain in the mouth or jaw  -unusual bleeding or bruising  Side effects that usually do not require medical attention (Report these to your doctor or health care professional if they continue or are bothersome.):  -muscle pain  -stomach upset, gas  This list may not describe all possible side effects. Call your doctor for medical advice about side effects. You may report side effects to FDA at 1-800-FDA-1088.  Where should I keep my medicine?  This medicine is only given in a clinic, doctor's office, or other health care setting and will not be stored at home.  NOTE: This sheet is a summary. It may not cover all possible information. If you have questions about this medicine, talk to your doctor, pharmacist, or health care provider.      2016, Elsevier/Gold Standard. (2011-11-19 12:37:47)

## 2016-06-19 ENCOUNTER — Telehealth: Payer: Self-pay | Admitting: Family Medicine

## 2016-06-19 ENCOUNTER — Other Ambulatory Visit: Payer: Self-pay | Admitting: Emergency Medicine

## 2016-06-19 ENCOUNTER — Other Ambulatory Visit: Payer: Self-pay | Admitting: *Deleted

## 2016-06-19 MED ORDER — LOSARTAN POTASSIUM 50 MG PO TABS: 50.0000 mg | ORAL_TABLET | Freq: Every day | ORAL | 0 refills | Status: DC

## 2016-06-19 MED ORDER — LOSARTAN POTASSIUM 50 MG PO TABS
50.0000 mg | ORAL_TABLET | Freq: Every day | ORAL | 0 refills | Status: DC
Start: 2016-06-19 — End: 2016-06-19

## 2016-06-19 NOTE — Telephone Encounter (Signed)
Pt calling stating that the pharmacy said that they sent over 3 request for pt losartan they haven't heard anything the pt called earlier today also and she has been out for two days now please respond

## 2016-06-19 NOTE — Telephone Encounter (Signed)
Patient called requesting a refill on losartan 50mg .Patient stated Dr Brigitte Pulse is now her doctor and she needs her on her prescriptions patient stated Dr Edilia Bo is still showing on her precriptions Please advise [phone]

## 2016-06-19 NOTE — Telephone Encounter (Signed)
Pt advised to schedule return visit in the next month for f/u of BP control. Advised no further refills will be given until scheduled appt.  Medication e-scribed to pharmacy

## 2016-06-19 NOTE — Addendum Note (Signed)
Addended by: Virgia Land on: 06/19/2016 06:41 PM   Modules accepted: Orders

## 2016-07-01 NOTE — Progress Notes (Signed)
Subjective:    Patient ID: [name], female    DOB: [birth year], 82 y.o.   MRN: [id number] Chief Complaint  Patient presents with  . Medication Problem    to disscus bp medications     HPI  [name] is an 82 yo woman here for a review of her HTN regimen.  I met her for the first time 6 mos prior at her AWV after which she was asked to RTC in 2 mos as BP uncontrolled and started on pravastatin.  1. Breast cancer - left lower, found during her AWV last yr. Mammogram 2 mos ago was normal.  2. HTN - lisinopril caused nocturnal cough so changed to losartan 25 which was titrated up to 75 mg. Checks BP at home 140-160/70-80.  At last visit I advised we add in hctz or change to valsartan. Pt preferred the latter but refused to be put on the equivalent dose of 325m - would only consent to take 878mof valsartan (= likely more equivalent to losartan 2590mso no changes were made.    3. Osteoporosis - DEXA 09/2014 showed osteoporosis with T score of -5.1 at lumbar and -2.5 at hip, being managed by pt's oncologist Dr. MagJana Hakiming treated by prolia inj every 6 mos. Repeat dexa in 2 years (after Dec 2018) to see if need to cont. She was instructed to start chewing 4 tums/d for calcium.  Vit D level was normal at 33 6 mos prior. [facility], keeps active at home.  Lives with her husband and brother as well.  4. HPL - LDL 172 -> 190s after she went off her fish oil rather than increase it as requested. ASCVD risk was 30% with baseline of 14% so I recommended pt start on pravastatin 40.  She did a diet with lemonade and maple syrup and cayenne pepper and get her #s down to 120s.  Has been doing fish oil and red yeast rice most of the time and is fasting today.  5. Pre-DM - hgba1c was 6.0 -> 5.7 -> 6.2 -> 6.0  Past Medical History:  Diagnosis Date  . Anxiety   . [place] [place] [place]  .  GERD (gastroesophageal reflux disease)   . Hypertension   . PONV (postoperative nausea and vomiting)   . PUD (peptic ulcer disease)   . Stone, kidney    Past Surgical History:  Procedure Laterality Date  . BREAST LUMPECTOMY WITH SENTINEL LYMPH NODE BIOPSY Left 10/22/2014   Procedure: BREAST LUMPECTOMY WITH SENTINEL LYMPH NODE BIOPSY;  Surgeon: PauAutumn MessingI, MD;  [facility];  Laterality: Left;  . RE-EXCISION OF BREAST CANCER,SUPERIOR MARGINS Left 11/04/2014   Procedure: RE-EXCISION OF LEFT BREAST INFERIOR MARGINS;  Surgeon: PauAutumn MessingI, MD;  [facility];  Laterality: Left;   Current Outpatient Prescriptions on File Prior to Visit  Medication Sig Dispense Refill  . anastrozole (ARIMIDEX) 1 MG tablet TAKE ONE TABLET BY MOUTH ONCE DAILY 90 tablet 4  . Ascorbic Acid (VITAMIN C) 1000 MG tablet Take 1,000 mg by mouth daily.    . aMarland Kitchenpirin 81 MG tablet Take 81 mg by mouth 3 (three) times a week.     . Calcium Carb-Cholecalciferol (CALCIUM + D3) 600-800 MG-UNIT TABS Take by mouth.    . cholecalciferol (VITAMIN D) 1000 UNITS  tablet Take 1,000 Units by mouth daily. Over the counter    . co-enzyme Q-10 30 MG capsule Take 30 mg by mouth 3 (three) times daily.    . fish oil-omega-3 fatty acids 1000 MG capsule Take 1 g by mouth daily.    Marland Kitchen GARLIC PO Take by mouth daily.    . magnesium gluconate (MAGONATE) 500 MG tablet Take 500 mg by mouth 2 (two) times daily.    . Multiple Vitamin (MULTIVITAMIN) tablet Take 1 tablet by mouth daily.     No current facility-administered medications on file prior to visit.    Allergies  Allergen Reactions  . Lisinopril     D/c lisinopril due to cough   Family History  Problem Relation Age of Onset  . Pancreatic cancer Mother     pancreastic  . Stroke Father   . Hypertension Brother   . Hypertension Sister   . Hypertension Brother   . Heart disease Brother   . Hypertension Sister    Social  History   Social History  . Marital status: Married    Spouse name: N/A  . Number of children: N/A  . Years of education: N/A   Occupational History  . retired    Social History Main Topics  . Smoking status: Never Smoker  . Smokeless tobacco: Never Used  . Alcohol use No  . Drug use: No  . Sexual activity: Not Asked   Other Topics Concern  . None   Social History Narrative  . None   Depression screen [facility] 2/9 07/02/2016 11/17/2015 12/01/2014 09/09/2014 04/08/2014  Decreased Interest 0 0 0 0 0  Down, Depressed, Hopeless 0 0 0 0 0  PHQ - 2 Score 0 0 0 0 0    Review of Systems  Constitutional: Positive for fatigue. Negative for appetite change, chills, diaphoresis and fever.  Eyes: Negative for visual disturbance.  Respiratory: Negative for cough and shortness of breath.   Cardiovascular: Negative for chest pain, palpitations and leg swelling.  Genitourinary: Negative for decreased urine volume.  Neurological: Negative for syncope and headaches.  Hematological: Does not bruise/bleed easily.   See hpi    Objective:   Physical Exam  Constitutional: She is oriented to person, place, and time. She appears well-developed and well-nourished. No distress.  HENT:  Head: Normocephalic and atraumatic.  Right Ear: External ear normal.  Left Ear: External ear normal.  Eyes: Conjunctivae are normal. No scleral icterus.  Neck: Normal range of motion. Neck supple. No thyromegaly present.  Cardiovascular: Normal rate, regular rhythm, normal heart sounds and intact distal pulses.   Pulmonary/Chest: Effort normal and breath sounds normal. No respiratory distress.  Musculoskeletal: She exhibits no edema.  Lymphadenopathy:    She has no cervical adenopathy.  Neurological: She is alert and oriented to person, place, and time.  Skin: Skin is warm and dry. She is not diaphoretic. No erythema.  Psychiatric: She has a normal mood and affect. Her behavior is normal.   BP 140/80 (BP Location:  Right Arm, Patient Position: Sitting, Cuff Size: Small)   Pulse 76   Temp 97.6 F (36.4 C) (Oral)   Resp 16   Ht _0  (1.651 m)   Wt 180 lb (81.6 kg)   SpO2 100%   BMI 29.95 kg/m   eGFR 56    Assessment & Plan:  needs prevnar, needs zostavax, needs flu - pt refuses today  1. HTN (hypertension), benign - eGFR 56; still above goal so increase losartan from  75 to 100  2. PUD (peptic ulcer disease)   3. Other osteoporosis without current pathological fracture   4. Pure hypercholesterolemia   5. Malignant neoplasm of lower-outer quadrant of left breast of female, estrogen receptor positive ([place])   6. Prediabetes   7. Other hyperlipidemia - rec trying Mediterranean diet. Reviewed that current ASCVD risk profile recommend statin but pt refuses    Orders Placed This Encounter  Procedures  . Lipid panel    Order Specific Question:   Has the patient fasted?    Answer:   Yes  . Comprehensive metabolic panel    Order Specific Question:   Has the patient fasted?    Answer:   Yes  . Hemoglobin A1c  . High sensitivity CRP  . High sensitivity CRP  . Specimen status report  . Care order/instruction:    AVS printed - let patient go!  . Care order/instruction:    Scheduling Instructions:     Recheck BP    Meds ordered this encounter  Medications  . losartan (COZAAR) 100 MG tablet    Sig: Take 1 tablet (100 mg total) by mouth daily.    Dispense:  90 tablet    Refill:  1     Delman Cheadle, M.D.  Primary Care at [facility] [address] [place], [place] [address] [phone] phone [phone] fax  07/22/16 2:41 AM

## 2016-07-02 ENCOUNTER — Encounter: Payer: Self-pay | Admitting: Family Medicine

## 2016-07-02 ENCOUNTER — Ambulatory Visit (INDEPENDENT_AMBULATORY_CARE_PROVIDER_SITE_OTHER): Payer: Medicare Other | Admitting: Family Medicine

## 2016-07-02 VITALS — BP 140/80 | HR 76 | Temp 97.6°F | Resp 16 | Ht 65.0 in | Wt 180.0 lb

## 2016-07-02 DIAGNOSIS — E784 Other hyperlipidemia: Secondary | ICD-10-CM

## 2016-07-02 DIAGNOSIS — K279 Peptic ulcer, site unspecified, unspecified as acute or chronic, without hemorrhage or perforation: Secondary | ICD-10-CM

## 2016-07-02 DIAGNOSIS — E7800 Pure hypercholesterolemia, unspecified: Secondary | ICD-10-CM | POA: Diagnosis not present

## 2016-07-02 DIAGNOSIS — I10 Essential (primary) hypertension: Secondary | ICD-10-CM | POA: Diagnosis not present

## 2016-07-02 DIAGNOSIS — R7303 Prediabetes: Secondary | ICD-10-CM | POA: Diagnosis not present

## 2016-07-02 DIAGNOSIS — Z170 Estrogen receptor positive status [ER+]: Secondary | ICD-10-CM

## 2016-07-02 DIAGNOSIS — C50512 Malignant neoplasm of lower-outer quadrant of left female breast: Secondary | ICD-10-CM | POA: Diagnosis not present

## 2016-07-02 DIAGNOSIS — E7849 Other hyperlipidemia: Secondary | ICD-10-CM

## 2016-07-02 DIAGNOSIS — M818 Other osteoporosis without current pathological fracture: Secondary | ICD-10-CM | POA: Diagnosis not present

## 2016-07-02 MED ORDER — LOSARTAN POTASSIUM 100 MG PO TABS: 100.0000 mg | ORAL_TABLET | Freq: Every day | ORAL | 1 refills | Status: DC

## 2016-07-02 NOTE — Patient Instructions (Addendum)
IF you received an x-ray today, you will receive an invoice from [facility]. Please contact [facility] at [phone] with questions or concerns regarding your invoice.   IF you received labwork today, you will receive an invoice from [facility]. Please contact [facility] at [phone] with questions or concerns regarding your invoice.   Our billing staff will not be able to assist you with questions regarding bills from these companies.  You will be contacted with the lab results as soon as they are available. The fastest way to get your results is to activate your My Chart account. Instructions are located on the last page of this paperwork. If you have not heard from [place] regarding the results in 2 weeks, please contact this office.        Why follow it? Research shows. . Those who follow the Mediterranean diet have a reduced risk of heart disease  . The diet is associated with a reduced incidence of Parkinson's and Alzheimer's diseases . People following the diet may have longer life expectancies and lower rates of chronic diseases  . The Dietary Guidelines for Americans recommends the Mediterranean diet as an eating plan to promote health and prevent disease  What Is the Mediterranean Diet?  . Healthy eating plan based on typical foods and recipes of Mediterranean-style cooking . The diet is primarily a plant based diet; these foods should make up a majority of meals   Starches - Plant based foods should make up a majority of meals - They are an important sources of vitamins, minerals, energy, antioxidants, and fiber - Choose whole grains, foods high in fiber and minimally processed items  - Typical grain sources include wheat, oats, barley, corn, brown rice, bulgar, farro, millet, polenta, couscous  - Various types of beans include chickpeas, lentils, fava beans, black beans, white beans   Fruits  Veggies - Large quantities of antioxidant rich fruits & veggies;  6 or more servings  - Vegetables can be eaten raw or lightly drizzled with oil and cooked  - Vegetables common to the traditional Mediterranean Diet include: artichokes, arugula, beets, broccoli, brussel sprouts, cabbage, carrots, celery, collard greens, cucumbers, eggplant, kale, leeks, lemons, lettuce, mushrooms, okra, onions, peas, peppers, potatoes, pumpkin, radishes, rutabaga, shallots, spinach, sweet potatoes, turnips, zucchini - Fruits common to the Mediterranean Diet include: apples, apricots, avocados, cherries, clementines, dates, figs, grapefruits, grapes, melons, nectarines, oranges, peaches, pears, pomegranates, strawberries, tangerines  Fats - Replace butter and margarine with healthy oils, such as olive oil, canola oil, and tahini  - Limit nuts to no more than a handful a day  - Nuts include walnuts, almonds, pecans, pistachios, pine nuts  - Limit or avoid candied, honey roasted or heavily salted nuts - Olives are central to the [employer] - can be eaten whole or used in a variety of dishes   Meats Protein - Limiting red meat: no more than a few times a month - When eating red meat: choose lean cuts and keep the portion to the size of deck of cards - Eggs: approx. 0 to 4 times a week  - Fish and lean poultry: at least 2 a week  - Healthy protein sources include, chicken, [place], lean beef, lamb - Increase intake of seafood such as tuna, salmon, trout, mackerel, shrimp, scallops - Avoid or limit high fat processed meats such as sausage and bacon  Dairy - Include moderate amounts of low fat dairy products  - Focus on healthy dairy such  as fat free yogurt, skim milk, low or reduced fat cheese - Limit dairy products higher in fat such as whole or 2% milk, cheese, ice cream  Alcohol - Moderate amounts of red wine is ok  - No more than 5 oz daily for women (all ages) and men older than age 64  - No more than 10 oz of wine daily for men younger than 84  Other - Limit sweets and  other desserts  - Use herbs and spices instead of salt to flavor foods  - Herbs and spices common to the traditional Mediterranean Diet include: basil, bay leaves, chives, cloves, cumin, fennel, garlic, lavender, marjoram, mint, oregano, parsley, pepper, rosemary, sage, savory, sumac, tarragon, thyme   It's not just a diet, it's a lifestyle:  . The Mediterranean diet includes lifestyle factors typical of those in the region  . Foods, drinks and meals are best eaten with others and savored . Daily physical activity is important for overall good health . This could be strenuous exercise like running and aerobics . This could also be more leisurely activities such as walking, housework, yard-work, or taking the stairs . Moderation is the key; a balanced and healthy diet accommodates most foods and drinks . Consider portion sizes and frequency of consumption of certain foods   Meal Ideas & Options:  . Breakfast:  o Whole wheat toast or whole wheat English muffins with peanut butter & hard boiled egg o Steel cut oats topped with apples & cinnamon and skim milk  o Fresh fruit: banana, strawberries, melon, berries, peaches  o Smoothies: strawberries, bananas, greek yogurt, peanut butter o Low fat greek yogurt with blueberries and granola  o Egg white omelet with spinach and mushrooms o Breakfast couscous: whole wheat couscous, apricots, skim milk, cranberries  . Sandwiches:  o Hummus and grilled vegetables (peppers, zucchini, squash) on whole wheat bread   o Grilled chicken on whole wheat pita with lettuce, tomatoes, cucumbers or tzatziki  o Tuna salad on whole wheat bread: tuna salad made with greek yogurt, olives, red peppers, capers, green onions o Garlic rosemary lamb pita: lamb sauted with garlic, rosemary, salt & pepper; add lettuce, cucumber, greek yogurt to pita - flavor with lemon juice and black pepper  . Seafood:  o Mediterranean grilled salmon, seasoned with garlic, basil, parsley,  lemon juice and black pepper o Shrimp, lemon, and spinach whole-grain pasta salad made with low fat greek yogurt  o Seared scallops with lemon orzo  o Seared tuna steaks seasoned salt, pepper, coriander topped with tomato mixture of olives, tomatoes, olive oil, minced garlic, parsley, green onions and cappers  . Meats:  o Herbed greek chicken salad with kalamata olives, cucumber, feta  o Red bell peppers stuffed with spinach, bulgur, lean ground beef (or lentils) & topped with feta   o Kebabs: skewers of chicken, tomatoes, onions, zucchini, squash  o [place] burgers: made with red onions, mint, dill, lemon juice, feta cheese topped with roasted red peppers . Vegetarian o Cucumber salad: cucumbers, artichoke hearts, celery, red onion, feta cheese, tossed in olive oil & lemon juice  o Hummus and whole grain pita points with a greek salad (lettuce, tomato, feta, olives, cucumbers, red onion) o Lentil soup with celery, carrots made with vegetable broth, garlic, salt and pepper  o Tabouli salad: parsley, bulgur, mint, scallions, cucumbers, tomato, radishes, lemon juice, olive oil, salt and pepper.

## 2016-07-03 LAB — HEMOGLOBIN A1C
ESTIMATED AVERAGE GLUCOSE: 120 mg/dL
Hgb A1c MFr Bld: 5.8 % — ABNORMAL HIGH (ref 4.8–5.6)

## 2016-07-03 LAB — COMPREHENSIVE METABOLIC PANEL
ALT: 14 IU/L (ref 0–32)
AST: 16 IU/L (ref 0–40)
Albumin/Globulin Ratio: 1.7 (ref 1.2–2.2)
Albumin: 4.5 g/dL (ref 3.5–4.8)
Alkaline Phosphatase: 43 IU/L (ref 39–117)
BILIRUBIN TOTAL: 0.6 mg/dL (ref 0.0–1.2)
BUN/Creatinine Ratio: 17 (ref 12–28)
BUN: 17 mg/dL (ref 8–27)
CHLORIDE: 105 mmol/L (ref 96–106)
CO2: 22 mmol/L (ref 18–29)
CREATININE: 0.99 mg/dL (ref 0.57–1.00)
Calcium: 10 mg/dL (ref 8.7–10.3)
GFR calc Af Amer: 63 mL/min/{1.73_m2} (ref >59)
GFR calc non Af Amer: 54 mL/min/{1.73_m2} — ABNORMAL LOW (ref >59)
GLUCOSE: 96 mg/dL (ref 65–99)
Globulin, Total: 2.7 g/dL (ref 1.5–4.5)
Potassium: 4.5 mmol/L (ref 3.5–5.2)
Sodium: 142 mmol/L (ref 134–144)
Total Protein: 7.2 g/dL (ref 6.0–8.5)

## 2016-07-03 LAB — LIPID PANEL
CHOL/HDL RATIO: 4.7 ratio — AB (ref 0.0–4.4)
Cholesterol, Total: 288 mg/dL — ABNORMAL HIGH (ref 100–199)
HDL: 61 mg/dL (ref >39)
LDL Calculated: 200 mg/dL — ABNORMAL HIGH (ref 0–99)
Triglycerides: 135 mg/dL (ref 0–149)
VLDL Cholesterol Cal: 27 mg/dL (ref 5–40)

## 2016-07-10 LAB — SPECIMEN STATUS REPORT

## 2016-07-10 LAB — HIGH SENSITIVITY CRP: CRP, High Sensitivity: 1.71 mg/L (ref 0.00–3.00)

## 2016-07-11 ENCOUNTER — Telehealth: Payer: Self-pay

## 2016-07-11 NOTE — Telephone Encounter (Signed)
PATIENT SAW DR. SHAW A COUPLE OF WEEKS AGO AND SHE WAS ABLE TO GET HER LAB RESULTS FROM MY-CHART. DR. Brigitte Pulse ASKED HER IF SHE WOULD LIKE TO START ON CHOLESTEROL MEDICINE. SHE SAID SHE HAS A FULL BOTTLE OF PRAVASTATIN 40 MG FROM LAST YEAR THAT SHE NEVER USED. SHE HAS A 3 MONTH SUPPLY. DOES DR. SHAW WANT HER TO START USING THAT? BEST PHONE [phone] (HOME) [place]

## 2016-07-12 NOTE — Telephone Encounter (Signed)
Yes please. Thanks. 

## 2016-07-12 NOTE — Telephone Encounter (Signed)
Please advise 

## 2016-07-13 NOTE — Telephone Encounter (Signed)
Message delivered

## 2016-07-30 ENCOUNTER — Other Ambulatory Visit: Payer: Self-pay | Admitting: Family Medicine

## 2016-07-30 DIAGNOSIS — Z853 Personal history of malignant neoplasm of breast: Secondary | ICD-10-CM

## 2016-09-27 ENCOUNTER — Other Ambulatory Visit: Payer: Self-pay

## 2016-09-28 ENCOUNTER — Ambulatory Visit
Admission: RE | Admit: 2016-09-28 | Discharge: 2016-09-28 | Disposition: A | Discharge status: Home / Self Care | Payer: Medicare Other | Source: Ambulatory Visit | Attending: Oncology | Admitting: Oncology

## 2016-09-28 ENCOUNTER — Ambulatory Visit
Admission: RE | Admit: 2016-09-28 | Discharge: 2016-09-28 | Disposition: A | Discharge status: Home / Self Care | Payer: Medicare Other | Source: Ambulatory Visit | Attending: Family Medicine | Admitting: Family Medicine

## 2016-09-28 DIAGNOSIS — C50512 Malignant neoplasm of lower-outer quadrant of left female breast: Secondary | ICD-10-CM

## 2016-09-28 DIAGNOSIS — M810 Age-related osteoporosis without current pathological fracture: Secondary | ICD-10-CM | POA: Diagnosis not present

## 2016-09-28 DIAGNOSIS — Z170 Estrogen receptor positive status [ER+]: Principal | ICD-10-CM

## 2016-09-28 DIAGNOSIS — Z780 Asymptomatic menopausal state: Secondary | ICD-10-CM | POA: Diagnosis not present

## 2016-09-28 DIAGNOSIS — R928 Other abnormal and inconclusive findings on diagnostic imaging of breast: Secondary | ICD-10-CM | POA: Diagnosis not present

## 2016-09-28 DIAGNOSIS — Z853 Personal history of malignant neoplasm of breast: Secondary | ICD-10-CM

## 2016-09-28 HISTORY — DX: Personal history of irradiation: Z92.3

## 2016-11-05 ENCOUNTER — Ambulatory Visit (INDEPENDENT_AMBULATORY_CARE_PROVIDER_SITE_OTHER): Payer: Medicare Other | Admitting: Family Medicine

## 2016-11-05 ENCOUNTER — Encounter: Payer: Self-pay | Admitting: Family Medicine

## 2016-11-05 VITALS — BP 201/87 | HR 69 | Temp 97.7°F | Resp 18 | Ht 65.0 in | Wt 176.6 lb

## 2016-11-05 DIAGNOSIS — E7800 Pure hypercholesterolemia, unspecified: Secondary | ICD-10-CM | POA: Diagnosis not present

## 2016-11-05 DIAGNOSIS — Z0000 Encounter for general adult medical examination without abnormal findings: Secondary | ICD-10-CM | POA: Diagnosis not present

## 2016-11-05 DIAGNOSIS — Z23 Encounter for immunization: Secondary | ICD-10-CM

## 2016-11-05 DIAGNOSIS — M818 Other osteoporosis without current pathological fracture: Secondary | ICD-10-CM

## 2016-11-05 DIAGNOSIS — I10 Essential (primary) hypertension: Secondary | ICD-10-CM

## 2016-11-05 DIAGNOSIS — R7303 Prediabetes: Secondary | ICD-10-CM | POA: Diagnosis not present

## 2016-11-05 DIAGNOSIS — Z1211 Encounter for screening for malignant neoplasm of colon: Secondary | ICD-10-CM

## 2016-11-05 DIAGNOSIS — Z1212 Encounter for screening for malignant neoplasm of rectum: Secondary | ICD-10-CM

## 2016-11-05 DIAGNOSIS — H6123 Impacted cerumen, bilateral: Secondary | ICD-10-CM | POA: Diagnosis not present

## 2016-11-05 LAB — POCT URINALYSIS DIP (MANUAL ENTRY)
Bilirubin, UA: NEGATIVE
Blood, UA: NEGATIVE
GLUCOSE UA: NEGATIVE mg/dL
Ketones, POC UA: NEGATIVE mg/dL
LEUKOCYTES UA: NEGATIVE
NITRITE UA: NEGATIVE
PROTEIN UA: NEGATIVE mg/dL
Spec Grav, UA: 1.015 (ref 1.010–1.025)
UROBILINOGEN UA: 0.2 U/dL
pH, UA: 7 (ref 5.0–8.0)

## 2016-11-05 NOTE — Progress Notes (Signed)
Subjective:    [name] is a 82 y.o. female who presents for Medicare Annual/Subsequent preventive examination. Last CPE was 1 yr prior 11/17/15 done by myself.  Preventive Screening-Counseling & Management  Tobacco History  Smoking Status  . Never Smoker  Smokeless Tobacco  . Never Used     Problems Prior to Visit 1. Breast cancer - left lower outer, estrogen receptor positive found during her AWV last yr. Mammogram 2 mos ago was normal. 2. HTN - lisinopril caused nocturnal cough so changed to losartan - increased dose 100 mg at last visit. Checks BP at home 140-150s/70s.  eGFR was 56 3. Osteoporosis - DEXA 09/2014 showed osteoporosis with T score of -5.1 at lumbar and -2.5 at hip, being managed by pt's oncologist Dr. [name] being treated by prolia inj every 6 mos. Repeat dexa in 2 years (after Dec 2018) to see if need to cont. She was instructed to start chewing 1-2 tums/d for calcium which she does sometimes.  Vit D level was normal at 33 18 mos prior - taking 5000u/d and her leg stiffness has improved. She is also taking magnesium sometimes which produces loose bm.  We talked and she is going to restart a daily calcium supp for 400-600 supp and increase yogurt to daily. 4. HPL - Lipids 4 mos prior showed LDL 172 -> 200 with non-hdl of 197 -> 227 so she was encouraged to further increase her fish oil though she has been off of it for a while. Current ASCVD risk profile recommend statin but pt refuses so rec Mediterranean diet.  High-sensitivity C-reactive protein should patient was at average risk. THere is no way pt would consider starting on a cholesterol medicine though has been on a supp she orders which has bergomot fruit from [place] and has been on the [employer] for the past >4 mos. Advised to pt to change her daily products to no fat and minimize cheese.   5. Pre-DM -  Lab Results  Component Value Date   HGBA1C 5.8 (H) 07/02/2016   HGBA1C 6.0 11/17/2015   HGBA1C 6.2  (H) 09/09/2014   HGBA1C 5.7 09/04/2013   HGBA1C 6.0 (H) 02/01/2011    Current Problems (verified) Patient Active Problem List   Diagnosis Date Noted  . Osteoporosis 10/20/2014  . Malignant neoplasm of lower-outer quadrant of left breast of female, estrogen receptor positive ([place]) 10/11/2014  . Pure hypercholesterolemia 04/08/2014  . Anxiety   . PUD (peptic ulcer disease)   . HTN (hypertension), benign 06/29/2011  . GERD (gastroesophageal reflux disease) 06/29/2011    Medications Prior to Visit Current Outpatient Prescriptions on File Prior to Visit  Medication Sig Dispense Refill  . anastrozole (ARIMIDEX) 1 MG tablet TAKE ONE TABLET BY MOUTH ONCE DAILY 90 tablet 4  . Ascorbic Acid (VITAMIN C) 1000 MG tablet Take 1,000 mg by mouth daily.    Marland Kitchen aspirin 81 MG tablet Take 81 mg by mouth 3 (three) times a week.     . Calcium Carb-Cholecalciferol (CALCIUM + D3) 600-800 MG-UNIT TABS Take by mouth.    . cholecalciferol (VITAMIN D) 1000 UNITS tablet Take 1,000 Units by mouth daily. Over the counter    . co-enzyme Q-10 30 MG capsule Take 30 mg by mouth 3 (three) times daily.    . fish oil-omega-3 fatty acids 1000 MG capsule Take 1 g by mouth daily.    Marland Kitchen GARLIC PO Take by mouth daily.    Marland Kitchen losartan (COZAAR) 100 MG tablet Take  1 tablet (100 mg total) by mouth daily. 90 tablet 1  . magnesium gluconate (MAGONATE) 500 MG tablet Take 500 mg by mouth 2 (two) times daily.    . Multiple Vitamin (MULTIVITAMIN) tablet Take 1 tablet by mouth daily.     No current facility-administered medications on file prior to visit.     Current Medications (verified) Current Outpatient Prescriptions  Medication Sig Dispense Refill  . anastrozole (ARIMIDEX) 1 MG tablet TAKE ONE TABLET BY MOUTH ONCE DAILY 90 tablet 4  . Ascorbic Acid (VITAMIN C) 1000 MG tablet Take 1,000 mg by mouth daily.    Marland Kitchen aspirin 81 MG tablet Take 81 mg by mouth 3 (three) times a week.     . Calcium Carb-Cholecalciferol (CALCIUM + D3)  600-800 MG-UNIT TABS Take by mouth.    . cholecalciferol (VITAMIN D) 1000 UNITS tablet Take 1,000 Units by mouth daily. Over the counter    . co-enzyme Q-10 30 MG capsule Take 30 mg by mouth 3 (three) times daily.    . fish oil-omega-3 fatty acids 1000 MG capsule Take 1 g by mouth daily.    Marland Kitchen GARLIC PO Take by mouth daily.    Marland Kitchen losartan (COZAAR) 100 MG tablet Take 1 tablet (100 mg total) by mouth daily. 90 tablet 1  . magnesium gluconate (MAGONATE) 500 MG tablet Take 500 mg by mouth 2 (two) times daily.    . Multiple Vitamin (MULTIVITAMIN) tablet Take 1 tablet by mouth daily.     No current facility-administered medications for this visit.      Allergies (verified) Lisinopril   PAST HISTORY  Family History Family History  Problem Relation Age of Onset  . Pancreatic cancer Mother        pancreastic  . Stroke Father   . Hypertension Brother   . Hypertension Sister   . Hypertension Brother   . Heart disease Brother   . Hypertension Sister     Social History Social History  Substance Use Topics  . Smoking status: Never Smoker  . Smokeless tobacco: Never Used  . Alcohol use No     Are there smokers in your home (other than you)? No  Risk Factors Current exercise habits: Home exercise routine includes walking, gardening, work around the house. She is not exercising regularly except by walking and exercise bike at [facility] which she does twice week.  Dietary issues discussed: tries to avoid meat and greasy foods and increase diet in veggies and fruits  Cardiac risk factors: advanced age (older than 47 for men, 29 for women), dyslipidemia, hypertension and sedentary lifestyle.  Depression Screen Depression screen [facility] 2/9 11/05/2016 07/02/2016 11/17/2015 12/01/2014 09/09/2014  Decreased Interest 0 0 0 0 0  Down, Depressed, Hopeless 0 0 0 0 0  PHQ - 2 Score 0 0 0 0 0     Activities of Daily Living In your present state of health, do you have any difficulty performing the following  activities?:  Driving? No Managing money?  No Feeding yourself? No Getting from bed to chair? No Climbing a flight of stairs? No Preparing food and eating?: No Bathing or showering? No - has a walk-in shower but is planning to get bars instead and in the pro Getting dressed: No Getting to the toilet? No Using the toilet:No Moving around from place to place: No In the past year have you fallen or had a near fall?:No   Are you sexually active?  Yes - married - rare  Do you have  more than one partner?  No Her brother lives with her and he has some balance problems.   Hearing Difficulties: Yes - some words she can't hear. Feels like her husband mumbles.  Do you often ask people to speak up or repeat themselves? Yes Do you experience ringing or noises in your ears? Yes - for years, occ gets dizzy when ear wax accumulates  Do you have difficulty understanding soft or whispered voices? Yes - has not needed hearing aides prior but main problem is when the pronunciation is not clear.  Advised pt to call for screening referral to audiology if needed - will often be of no cost to get screening, encouraged pt to purse this but she declines for now and agrees to change if preference change.   Do you feel that you have a problem with memory? No  Do you often misplace items? Yes  Do you feel safe at home?  Yes  Cognitive Testing  Alert?Yes  Normal Appearance?Yes  Oriented to person? Yes  Place? Yes   Time? Yes  Recall of three objects?  Yes  Can perform simple calculations? Yes  Displays appropriate judgment?Yes  Can read the correct time from a watch face?Yes   Advanced Directives have been discussed with the patient? Yes   ADVANCED DIRECTIVES: the patient has named her son [name] as healthcare power of attorney. He is not the oldest but works indirectly with health care.  He can be reached at [phone].    List the Names of Other Physician/Practitioners you currently use: 1.  Clarene Essex, MD as Consulting Physician (Gastroenterology) 2.  Jovita Kussmaul, MD as Consulting Physician (General Surgery) 3.  Chauncey Cruel, MD as Consulting Physician (Oncology) 4.  Eppie Gibson, MD as Attending Physician (Radiation Oncology) 5.  Eye doctor - not seen in 2-3 yrs. 6.  Dentist - sees regularly   Indicate any recent Medical Services you may have received from other than [facility] providers in the past year (date may be approximate).  Immunization History  Administered Date(s) Administered  . Hepatitis B 07/15/2004, 08/19/2004, 01/12/2005  . Td 07/15/2004, 09/09/2014    Screening Tests Health Maintenance  Topic Date Due  . PNA vac Low Risk Adult (1 of 2 - PCV13) 05/14/2002  . INFLUENZA VACCINE  09/02/2019 (Originally 01/02/2017)  . TETANUS/TDAP  09/08/2024  . DEXA SCAN  Completed    All answers were reviewed with the patient and necessary referrals were made:  SHAW,EVA, MD   11/05/2016   History reviewed: allergies, current medications, past family history, past medical history, past social history, past surgical history and problem list  Review of Systems Pertinent items are noted in HPI.    Objective:  There were no vitals taken for this visit.  There is no height or weight on file to calculate BMI. There were no vitals taken for this visit.  There were no vitals taken for this visit.  General Appearance:    Alert, cooperative, no distress, appears stated age  Head:    Normocephalic, without obvious abnormality, atraumatic  Eyes:    PERRL, conjunctiva/corneas clear, EOM's intact, fundi    benign, both eyes  Ears:    Normal TM's and external ear canals, both ears  Nose:   Nares normal, septum midline, mucosa normal, no drainage    or sinus tenderness  Throat:   Lips, mucosa, and tongue normal; teeth and gums normal  Neck:   Supple, symmetrical, trachea midline, no adenopathy;  thyroid:  no enlargement/tenderness/nodules; no carotid   bruit or JVD  Back:      Symmetric, no curvature, ROM normal, no CVA tenderness  Lungs:     Clear to auscultation bilaterally, respirations unlabored  Chest Wall:    No tenderness or deformity   Heart:    Regular rate and rhythm, S1 and S2 normal, no murmur, rub   or gallop  Breast Exam:    No tenderness, masses, or nipple abnormality  Abdomen:     Soft, non-tender, bowel sounds active all four quadrants,    no masses, no organomegaly        Extremities:   Extremities normal, atraumatic, no cyanosis or edema  Pulses:   2+ and symmetric all extremities  Skin:   Skin color, texture, turgor normal, no rashes or lesions  Lymph nodes:   Cervical, supraclavicular, and axillary nodes normal  Neurologic:   CNII-XII intact, normal strength, sensation and reflexes    throughout       Assessment:          Plan:  A1c, ua, cmp, vit D, lipid   needs prevnar, needs zostavax - declines latter. Had dexa and mammogram last mo - review Refer back to [facility] prior to 82 yo - done Check liipids to see what 2 mos of supp and 4 mos of diet have done. If this failes, she would cons lipid-lowering therapy.  During the course of the visit the patient was educated and counseled about appropriate screening and preventive services including:     Immunizations: Td done 2016, needs prevnar, needs zostavax, needs flu Osteoporosis: Dexa done 09/27/14 - repeated 09/2016 CRS: did HOC last yr - negative, aged out of need for colonoscopy - per pt Dr. Ewing Schlein wanted to repeat one before she turned 82  HPL: was supposed to be on fish oil but went off, LDL continues to worsen to 190s, ASCVD risk 30% with baseline 14% so rec starting pravastatin 40 - recheck in 2 mos for lfts (and recheck bp)  Diet review for nutrition referral? Yes ____  Not Indicated ____  HTN: recommended to pt that we change losartan 100 to losartan-hctz 100-12.5. Pt was considering wanting to switch to valsartan as her son is doing very well on it which I think is a good  idea since a little stronger but she would only consent to be put on 80s - rec that she would likely need to be on the 320 mg dose and she refuses as the mg amount sounds to high despite repeated explanations that the mg dose of medications are not related nor relatively important but she still refuses.  Pt will only stay on losartan 100 for now.  Recheck and readdress at OV in 2 mos, cont to monitor BP at home.  Patient Instructions (the written plan) was given to the patient.  Medicare Attestation I have personally reviewed: The patient's medical and social history Their use of alcohol, tobacco or illicit drugs Their current medications and supplements The patient's functional ability including ADLs,fall risks, home safety risks, cognitive, and hearing and visual impairment Diet and physical activities Evidence for depression or mood disorders  The patient's weight, height, BMI, and visual acuity have been recorded in the chart.  I have made referrals, counseling, and provided education to the patient based on review of the above and I have provided the patient with a written personalized care plan for preventive services.     Norberto Sorenson, MD   11/05/2016

## 2016-11-05 NOTE — Patient Instructions (Addendum)
Your DEXA bone scan last month was looking good. Your hip was stable since April 2016 and your spine measurements even got a little bit better. Continue to follow up with Dr. Legrand Como not for the Prolia.   Add in a calcium 400-600 mg supplement once a day. Try stopping the magnesium.  Please call for referral for hearing testing whenever you would like and certainly if you noticed any further problems.  IF you received an x-ray today, you will receive an invoice from [facility]. Please contact [facility] at [phone] with questions or concerns regarding your invoice.   IF you received labwork today, you will receive an invoice from [facility]. Please contact [facility] at [phone] with questions or concerns regarding your invoice.   Our billing staff will not be able to assist you with questions regarding bills from these companies.  You will be contacted with the lab results as soon as they are available. The fastest way to get your results is to activate your My Chart account. Instructions are located on the last page of this paperwork. If you have not heard from [place] regarding the results in 2 weeks, please contact this office.      Bone Health Bones protect organs, store calcium, and anchor muscles. Good health habits, such as eating nutritious foods and exercising regularly, are important for maintaining healthy bones. They can also help to prevent a condition that causes bones to lose density and become weak and brittle (osteoporosis). Why is bone mass important? Bone mass refers to the amount of bone tissue that you have. The higher your bone mass, the stronger your bones. An important step toward having healthy bones throughout life is to have strong and dense bones during childhood. A young adult who has a high bone mass is more likely to have a high bone mass later in life. Bone mass at its greatest it is called peak bone mass. A large decline in bone mass occurs in  older adults. In women, it occurs about the time of menopause. During this time, it is important to practice good health habits, because if more bone is lost than what is replaced, the bones will become less healthy and more likely to break (fracture). If you find that you have a low bone mass, you may be able to prevent osteoporosis or further bone loss by changing your diet and lifestyle. How can I find out if my bone mass is low? Bone mass can be measured with an X-ray test that is called a bone mineral density (BMD) test. This test is recommended for all women who are age 40 or older. It may also be recommended for men who are age 63 or older, or for people who are more likely to develop osteoporosis due to:  Having bones that break easily.  Having a long-term disease that weakens bones, such as kidney disease or rheumatoid arthritis.  Having menopause earlier than normal.  Taking medicine that weakens bones, such as steroids, thyroid hormones, or hormone treatment for breast cancer or prostate cancer.  Smoking.  Drinking three or more alcoholic drinks each day.  What are the nutritional recommendations for healthy bones? To have healthy bones, you need to get enough of the right minerals and vitamins. Most nutrition experts recommend getting these nutrients from the foods that you eat. Nutritional recommendations vary from person to person. Ask your health care provider what is healthy for you. Here are some general guidelines. Calcium Recommendations Calcium is the most  important (essential) mineral for bone health. Most people can get enough calcium from their diet, but supplements may be recommended for people who are at risk for osteoporosis. Good sources of calcium include:  Dairy products, such as low-fat or nonfat milk, cheese, and yogurt.  Dark green leafy vegetables, such as bok choy and broccoli.  Calcium-fortified foods, such as orange juice, cereal, bread, soy beverages, and  tofu products.  Nuts, such as almonds.  Follow these recommended amounts for daily calcium intake:  Children, age 50?3: 700 mg.  Children, age 72?8: 1,000 mg.  Children, age 80?13: 1,300 mg.  Teens, age 76?18: 1,300 mg.  Adults, age 30?50: 1,000 mg.  Adults, age 69?70: ? Men: 1,000 mg. ? Women: 1,200 mg.  Adults, age 65 or older: 1,200 mg.  Pregnant and breastfeeding females: ? Teens: 1,300 mg. ? Adults: 1,000 mg.  Vitamin D Recommendations Vitamin D is the most essential vitamin for bone health. It helps the body to absorb calcium. Sunlight stimulates the skin to make vitamin D, so be sure to get enough sunlight. If you live in a cold climate or you do not get outside often, your health care provider may recommend that you take vitamin D supplements. Good sources of vitamin D in your diet include:  Egg yolks.  Saltwater fish.  Milk and cereal fortified with vitamin D.  Follow these recommended amounts for daily vitamin D intake:  Children and teens, age 77?18: 70 international units.  Adults, age 80 or younger: 400-800 international units.  Adults, age 721 or older: 800-1,000 international units.  Other Nutrients Other nutrients for bone health include:  Phosphorus. This mineral is found in meat, poultry, dairy foods, nuts, and legumes. The recommended daily intake for adult men and adult women is 700 mg.  Magnesium. This mineral is found in seeds, nuts, dark green vegetables, and legumes. The recommended daily intake for adult men is 400?420 mg. For adult women, it is 310?320 mg.  Vitamin K. This vitamin is found in green leafy vegetables. The recommended daily intake is 120 mg for adult men and 90 mg for adult women.  What type of physical activity is best for building and maintaining healthy bones? Weight-bearing and strength-building activities are important for building and maintaining peak bone mass. Weight-bearing activities cause muscles and bones to work  against gravity. Strength-building activities increases muscle strength that supports bones. Weight-bearing and muscle-building activities include:  Walking and hiking.  Jogging and running.  Dancing.  Gym exercises.  Lifting weights.  Tennis and racquetball.  Climbing stairs.  Aerobics.  Adults should get at least 30 minutes of moderate physical activity on most days. Children should get at least 60 minutes of moderate physical activity on most days. Ask your health care provide what type of exercise is best for you. Where can I find more information? For more information, check out the following websites:  [place]: YardHomes.se  [employer] of Health: http://www.niams.AnonymousEar.fr.asp  This information is not intended to replace advice given to you by your health care provider. Make sure you discuss any questions you have with your health care provider. Document Released: 08/11/2003 Document Revised: 12/09/2015 Document Reviewed: 05/26/2014 Elsevier Interactive Patient Education  2018 [place].  Calcium Intake Recommendations Calcium is a mineral that affects many functions in the body, including:  Blood clotting.  Blood vessel function.  Nerve impulse conduction.  Hormone secretion.  Muscle contraction.  Bone and teeth functions.  Most of your body's calcium supply is  stored in your bones and teeth. When your calcium stores are low, you may be at risk for low bone mass, bone loss, and bone fractures. Consuming enough calcium helps to grow healthy bones and teeth and to prevent breakdown over time. It is very important that you get enough calcium if you are:  A child undergoing rapid growth.  An adolescent girl.  A pre- or post-menopausal woman.  A woman whose menstrual cycle has stopped due to anorexia nervosa or regular intense exercise.  An individual with  lactose intolerance or a milk allergy.  A vegetarian.  What is my plan? Try to consume the recommended amount of calcium daily based on your age. Depending on your overall health, your health care provider may recommend increased calcium intake.General daily calcium intake recommendations by age are:  Birth to 6 months: 200 mg.  Infants 7 to 12 months: 260 mg.  Children 1 to 3 years: 700 mg.  Children 4 to 8 years: 1,000 mg.  Children 9 to 13 years: 1,300 mg.  Teens 14 to 18 years: 1,300 mg.  Adults 19 to 50 years: 1,000 mg.  Adult women 51 to 70 years: 1,200 mg.  Adult men 51 to 70 years: 1,000 mg.  Adults 71 years and older: 1,200 mg.  Pregnant and breastfeeding teens: 1,300 mg.  Pregnant and breastfeeding adults: 1,000 mg.  What do I need to know about calcium intake?  In order for the body to absorb calcium, it needs vitamin D. You can get vitamin D through: ? Direct exposure of the skin to sunlight. ? Foods, such as egg yolks, liver, saltwater fish, and fortified milk. ? Supplements.  Consuming too much calcium may cause: ? Constipation. ? Decreased absorption of iron and zinc. ? Kidney stones.  Calcium supplements may interact with certain medicines. Check with your health care provider before starting any calcium supplements.  Try to get most of your calcium from food. What foods can I eat? Grains  Fortified oatmeal. Fortified ready-to-eat cereals. Fortified frozen waffles. Vegetables Turnip greens. Broccoli. Fruits Fortified orange juice. Meats and Other Protein Sources Canned sardines with bones. Canned salmon with bones. Soy beans. Tofu. Baked beans. Almonds. [place] nuts. Sunflower seeds. Dairy Milk. Yogurt. Cheese. Cottage cheese. Beverages Fortified soy milk. Fortified rice milk. Sweets/Desserts Pudding. Ice Cream. Milkshakes. Blackstrap molasses. The items listed above may not be a complete list of recommended foods or beverages. Contact  your dietitian for more options. What foods can affect my calcium intake? It may be more difficult for your body to use calcium or calcium may leave your body more quickly if you consume large amounts of:  Sodium.  Protein.  Caffeine.  Alcohol.  This information is not intended to replace advice given to you by your health care provider. Make sure you discuss any questions you have with your health care provider. Document Released: 01/03/2004 Document Revised: 12/09/2015 Document Reviewed: 10/27/2013 Elsevier Interactive Patient Education  2018 [employer].

## 2016-11-06 LAB — COMPREHENSIVE METABOLIC PANEL
ALBUMIN: 4.6 g/dL (ref 3.5–4.8)
ALT: 16 IU/L (ref 0–32)
AST: 24 IU/L (ref 0–40)
Albumin/Globulin Ratio: 1.8 (ref 1.2–2.2)
Alkaline Phosphatase: 43 IU/L (ref 39–117)
BILIRUBIN TOTAL: 0.7 mg/dL (ref 0.0–1.2)
BUN/Creatinine Ratio: 17 (ref 12–28)
BUN: 16 mg/dL (ref 8–27)
CALCIUM: 11.3 mg/dL — AB (ref 8.7–10.3)
CHLORIDE: 104 mmol/L (ref 96–106)
CO2: 23 mmol/L (ref 18–29)
Creatinine, Ser: 0.96 mg/dL (ref 0.57–1.00)
GFR, EST AFRICAN AMERICAN: 65 mL/min/{1.73_m2} (ref >59)
GFR, EST NON AFRICAN AMERICAN: 56 mL/min/{1.73_m2} — AB (ref >59)
GLUCOSE: 101 mg/dL — AB (ref 65–99)
Globulin, Total: 2.6 g/dL (ref 1.5–4.5)
Potassium: 4.1 mmol/L (ref 3.5–5.2)
Sodium: 143 mmol/L (ref 134–144)
TOTAL PROTEIN: 7.2 g/dL (ref 6.0–8.5)

## 2016-11-06 LAB — LIPID PANEL
CHOL/HDL RATIO: 4.3 ratio (ref 0.0–4.4)
Cholesterol, Total: 279 mg/dL — ABNORMAL HIGH (ref 100–199)
HDL: 65 mg/dL (ref >39)
LDL Calculated: 189 mg/dL — ABNORMAL HIGH (ref 0–99)
TRIGLYCERIDES: 126 mg/dL (ref 0–149)
VLDL Cholesterol Cal: 25 mg/dL (ref 5–40)

## 2016-11-06 LAB — VITAMIN D 25 HYDROXY (VIT D DEFICIENCY, FRACTURES): VIT D 25 HYDROXY: 36.3 ng/mL (ref 30.0–100.0)

## 2016-11-06 LAB — HEMOGLOBIN A1C
Est. average glucose Bld gHb Est-mCnc: 117 mg/dL
Hgb A1c MFr Bld: 5.7 % — ABNORMAL HIGH (ref 4.8–5.6)

## 2016-11-16 ENCOUNTER — Other Ambulatory Visit: Payer: Self-pay | Admitting: *Deleted

## 2016-11-16 DIAGNOSIS — C50512 Malignant neoplasm of lower-outer quadrant of left female breast: Secondary | ICD-10-CM

## 2016-11-19 ENCOUNTER — Ambulatory Visit (HOSPITAL_BASED_OUTPATIENT_CLINIC_OR_DEPARTMENT_OTHER): Payer: Medicare Other | Admitting: Oncology

## 2016-11-19 ENCOUNTER — Other Ambulatory Visit (HOSPITAL_BASED_OUTPATIENT_CLINIC_OR_DEPARTMENT_OTHER): Payer: Medicare Other

## 2016-11-19 ENCOUNTER — Ambulatory Visit (HOSPITAL_BASED_OUTPATIENT_CLINIC_OR_DEPARTMENT_OTHER): Payer: Medicare Other

## 2016-11-19 VITALS — BP 200/73 | HR 71 | Temp 98.1°F | Resp 18 | Ht 65.0 in | Wt 177.8 lb

## 2016-11-19 DIAGNOSIS — C50512 Malignant neoplasm of lower-outer quadrant of left female breast: Secondary | ICD-10-CM | POA: Diagnosis not present

## 2016-11-19 DIAGNOSIS — M810 Age-related osteoporosis without current pathological fracture: Secondary | ICD-10-CM

## 2016-11-19 DIAGNOSIS — Z170 Estrogen receptor positive status [ER+]: Secondary | ICD-10-CM | POA: Diagnosis not present

## 2016-11-19 DIAGNOSIS — M818 Other osteoporosis without current pathological fracture: Secondary | ICD-10-CM

## 2016-11-19 LAB — CBC WITH DIFFERENTIAL/PLATELET
BASO%: 0.7 % (ref 0.0–2.0)
Basophils Absolute: 0 10*3/uL (ref 0.0–0.1)
EOS%: 3.5 % (ref 0.0–7.0)
Eosinophils Absolute: 0.2 10*3/uL (ref 0.0–0.5)
HCT: 36.8 % (ref 34.8–46.6)
HGB: 11.9 g/dL (ref 11.6–15.9)
LYMPH#: 1 10*3/uL (ref 0.9–3.3)
LYMPH%: 21.5 % (ref 14.0–49.7)
MCH: 29.8 pg (ref 25.1–34.0)
MCHC: 32.3 g/dL (ref 31.5–36.0)
MCV: 92.2 fL (ref 79.5–101.0)
MONO#: 0.3 10*3/uL (ref 0.1–0.9)
MONO%: 7 % (ref 0.0–14.0)
NEUT%: 67.3 % (ref 38.4–76.8)
NEUTROS ABS: 3.1 10*3/uL (ref 1.5–6.5)
PLATELETS: 204 10*3/uL (ref 145–400)
RBC: 3.99 10*6/uL (ref 3.70–5.45)
RDW: 13.1 % (ref 11.2–14.5)
WBC: 4.6 10*3/uL (ref 3.9–10.3)

## 2016-11-19 LAB — COMPREHENSIVE METABOLIC PANEL
ALT: 16 U/L (ref 0–55)
ANION GAP: 7 meq/L (ref 3–11)
AST: 18 U/L (ref 5–34)
Albumin: 3.7 g/dL (ref 3.5–5.0)
Alkaline Phosphatase: 37 U/L — ABNORMAL LOW (ref 40–150)
BILIRUBIN TOTAL: 0.61 mg/dL (ref 0.20–1.20)
BUN: 13.4 mg/dL (ref 7.0–26.0)
CO2: 23 meq/L (ref 22–29)
CREATININE: 1 mg/dL (ref 0.6–1.1)
Calcium: 10.3 mg/dL (ref 8.4–10.4)
Chloride: 112 mEq/L — ABNORMAL HIGH (ref 98–109)
EGFR: 64 mL/min/{1.73_m2} — ABNORMAL LOW (ref >90)
GLUCOSE: 95 mg/dL (ref 70–140)
Potassium: 4 mEq/L (ref 3.5–5.1)
Sodium: 142 mEq/L (ref 136–145)
TOTAL PROTEIN: 7 g/dL (ref 6.4–8.3)

## 2016-11-19 MED ORDER — DENOSUMAB 60 MG/ML ~~LOC~~ SOLN
60.0000 mg | Freq: Once | SUBCUTANEOUS | Status: AC
  Administered 2016-11-19: 60 mg via SUBCUTANEOUS
  Filled 2016-11-19: qty 1

## 2016-11-19 NOTE — Progress Notes (Signed)
[facility]  [phone] [phone]     ID: [name] DOB: 1935/01/31  MR#: [id number]  UYQ#:[id number]  Patient Care Team: Sherren Mocha, MD as PCP - General (Family Medicine) [name], MD as Consulting Physician (Gastroenterology) Griselda Miner, MD as Consulting Physician (General Surgery) Gionni Freese, [name], MD as Consulting Physician (Oncology) Lonie Peak, MD as Attending Physician (Radiation Oncology) Pershing Proud, RN as Registered Nurse Donnelly Angelica, RN as Registered Nurse PCP: Sherren Mocha, MD OTHER MD:  CHIEF COMPLAINT: Estrogen receptor positive breast cancer  CURRENT TREATMENT: Anastrozole, denosumab/Prolia   BREAST CANCER HISTORY: From the original intake note:  [name] noted a palpable mass in the left breast and brought it to her primary care physician's attention. On 09/27/2014 the patient underwent bilateral diagnostic mammography with tomosynthesis and left breast ultrasonography. The breast density was category B. In the outer left breast area there was an irregularly shaped mass not associated with suspicious microcalcifications or distortion. The previously described large of fibroadenolipoma in the right breast was unchanged. On physical exam there was a firm palpable mass at the 3:30 position in the left breast 2 cm prone the nipple which, on targeted ultrasound, was irregular, hypoechoic, and measured 2.0 cm. Ultrasound of the left axilla was negative.  Biopsy of this mass 10/05/2014 showed ([id number]) an invasive ductal carcinoma him a grade 1, estrogen receptor 90% positive, progesterone receptor 90% positive, both with strong staining intensity, with an MIB-1 of 5%, and no HER-2 amplification, the signals ratio being 1.06 and the number per cell 1.70.  The patient's subsequent history is as detailed below  INTERVAL HISTORY: [name] returns today for follow-up and treatment of her estrogen receptor positive  breast cancer. She continues on anastrozole, with good tolerance. Hot flashes and vaginal dryness are not a major issue. She never developed the arthralgias or myalgias that many patients can experience on this medication. She obtains it at a good price.  She also receives denosumab/Prolia every 6 months, with a dose due today. She has no side effects from that but she is aware . REVIEW OF SYSTEMS: She enjoys family events and gardening. She goes to the gym regularly where she walks and does a little bit of biking. She complains of sinus problems. Otherwise a detailed review of systems today was stable  PAST MEDICAL HISTORY: Past Medical History:  Diagnosis Date  . Anxiety   . Breast cancer (HCC)   . Breast cancer of lower-outer quadrant of left female breast (HCC) 10/11/2014  . GERD (gastroesophageal reflux disease)   . Hypertension   . Personal history of radiation therapy    2016  . PONV (postoperative nausea and vomiting)   . PUD (peptic ulcer disease)   . Stone, kidney     PAST SURGICAL HISTORY: Past Surgical History:  Procedure Laterality Date  . BREAST LUMPECTOMY WITH SENTINEL LYMPH NODE BIOPSY Left 10/22/2014   Procedure: BREAST LUMPECTOMY WITH SENTINEL LYMPH NODE BIOPSY;  Surgeon: Chevis Pretty III, MD;  Location: [facility];  Service: General;  Laterality: Left;  . RE-EXCISION OF BREAST CANCER,SUPERIOR MARGINS Left 11/04/2014   Procedure: RE-EXCISION OF LEFT BREAST INFERIOR MARGINS;  Surgeon: Chevis Pretty III, MD;  Location: [facility] OR;  Service: General;  Laterality: Left;    FAMILY HISTORY Family History  Problem Relation Age of Onset  . Pancreatic cancer Mother        pancreastic  . Stroke Father   . Hypertension  Brother   . Hypertension Sister   . Hypertension Brother   . Heart disease Brother   . Hypertension Sister    the patient's father died following a stroke at age 36. The patient's mother died from pancreatic cancer the age of 43. The patient had 5  brothers, 2 sisters. There is no history of breast or ovarian cancer in the family to the patient's knowledge   GYNECOLOGIC HISTORY:  No LMP recorded. Patient is postmenopausal.  menarche age 52, first live birth age 52, the patient is GX P4. She went through the change of life in her late 44s. She took hormone replacement for approximately 3 years. She also took oral contraceptives for a few years remotely, with no complications.   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 Her husband [name] Ruiz is a Scientist, water quality. Daughter [name] lives in  [place], near [place], where she Museum/gallery exhibitions officer. Son [name] lives in [place] and works in Sales promotion account executive. Son [name] also lives in [place]. He is a Armed forces technical officer with [employer]. Daughter [name] lives in [place] and teaches in [place] [address] ([place] and [place]). The patient has 11 grandchildren. She attends a local [employer]     ADVANCED DIRECTIVES:  the patient has named her son [name] as healthcare power of attorney. He can be reached at [phone].    HEALTH MAINTENANCE: Social History  Substance Use Topics  . Smoking status: Never Smoker  . Smokeless tobacco: Never Used  . Alcohol use No     Colonoscopy: Due 2019?  PAP:  Bone density:April 2018   Allergies  Allergen Reactions  . Lisinopril     D/c lisinopril due to cough    Current Outpatient Prescriptions  Medication Sig Dispense Refill  . anastrozole (ARIMIDEX) 1 MG tablet TAKE ONE TABLET BY MOUTH ONCE DAILY 90 tablet 4  . Ascorbic Acid (VITAMIN C) 1000 MG tablet Take 1,000 mg by mouth daily.    Marland Kitchen aspirin 81 MG tablet Take 81 mg by mouth 3 (three) times a week.     . Calcium Carb-Cholecalciferol (CALCIUM + D3) 600-800 MG-UNIT TABS Take by mouth.    . cholecalciferol (VITAMIN D) 1000 UNITS tablet Take 1,000 Units by mouth daily. Over the counter    . co-enzyme Q-10 30  MG capsule Take 30 mg by mouth 3 (three) times daily.    . fish oil-omega-3 fatty acids 1000 MG capsule Take 1 g by mouth daily.    Marland Kitchen GARLIC PO Take by mouth daily.    Marland Kitchen losartan (COZAAR) 100 MG tablet Take 1 tablet (100 mg total) by mouth daily. 90 tablet 1  . magnesium gluconate (MAGONATE) 500 MG tablet Take 500 mg by mouth 2 (two) times daily.    . Multiple Vitamin (MULTIVITAMIN) tablet Take 1 tablet by mouth daily.     No current facility-administered medications for this visit.     OBJECTIVE: older African-American womanIn no acute distress  Vitals:   11/19/16 1004  BP: (!) 200/73  Pulse: 71  Resp: 18  Temp: 98.1 F (36.7 C)     Body mass index is 29.59 kg/m.    ECOG FS:0 - Asymptomatic  Sclerae unicteric, pupils round and equal Oropharynx clear and moist No cervical or supraclavicular adenopathy Lungs no rales or rhonchi Heart regular rate and rhythm Abd soft, nontender, positive bowel sounds MSK kyphosis but no focal spinal tenderness, no upper extremity lymphedema Neuro:  nonfocal, well oriented, appropriate affect Breasts: The right breast is dense feeling, but otherwise benign. There are no skin or nipple changes of concern. The left breast is status post lumpectomy and radiation. There is no evidence of local recurrence. Both axillae are benign.  LAB RESULTS:  CMP     Component Value Date/Time   NA 143 11/05/2016 1045   NA 143 05/21/2016 1029   K 4.1 11/05/2016 1045   K 3.8 05/21/2016 1029   CL 104 11/05/2016 1045   CO2 23 11/05/2016 1045   CO2 24 05/21/2016 1029   GLUCOSE 101 (H) 11/05/2016 1045   GLUCOSE 89 05/21/2016 1029   BUN 16 11/05/2016 1045   BUN 14.7 05/21/2016 1029   CREATININE 0.96 11/05/2016 1045   CREATININE 1.0 05/21/2016 1029   CALCIUM 11.3 (H) 11/05/2016 1045   CALCIUM 10.2 05/21/2016 1029   PROT 7.2 11/05/2016 1045   PROT 7.6 05/21/2016 1029   ALBUMIN 4.6 11/05/2016 1045   ALBUMIN 3.7 05/21/2016 1029   AST 24 11/05/2016 1045   AST  20 05/21/2016 1029   ALT 16 11/05/2016 1045   ALT 18 05/21/2016 1029   ALKPHOS 43 11/05/2016 1045   ALKPHOS 51 05/21/2016 1029   BILITOT 0.7 11/05/2016 1045   BILITOT 0.85 05/21/2016 1029   GFRNONAA 56 (L) 11/05/2016 1045   GFRNONAA 63 09/09/2014 1150   GFRAA 65 11/05/2016 1045   GFRAA 72 09/09/2014 1150    INo results found for: SPEP, UPEP  Lab Results  Component Value Date   WBC 4.6 11/19/2016   NEUTROABS 3.1 11/19/2016   HGB 11.9 11/19/2016   HCT 36.8 11/19/2016   MCV 92.2 11/19/2016   PLT 204 11/19/2016      Chemistry      Component Value Date/Time   NA 143 11/05/2016 1045   NA 143 05/21/2016 1029   K 4.1 11/05/2016 1045   K 3.8 05/21/2016 1029   CL 104 11/05/2016 1045   CO2 23 11/05/2016 1045   CO2 24 05/21/2016 1029   BUN 16 11/05/2016 1045   BUN 14.7 05/21/2016 1029   CREATININE 0.96 11/05/2016 1045   CREATININE 1.0 05/21/2016 1029      Component Value Date/Time   CALCIUM 11.3 (H) 11/05/2016 1045   CALCIUM 10.2 05/21/2016 1029   ALKPHOS 43 11/05/2016 1045   ALKPHOS 51 05/21/2016 1029   AST 24 11/05/2016 1045   AST 20 05/21/2016 1029   ALT 16 11/05/2016 1045   ALT 18 05/21/2016 1029   BILITOT 0.7 11/05/2016 1045   BILITOT 0.85 05/21/2016 1029       No results found for: LABCA2  No components found for: LABCA125  No results for input(s): INR in the last 168 hours.  Urinalysis    Component Value Date/Time   COLORURINE YELLOW 02/01/2011 1158   APPEARANCEUR CLEAR 02/01/2011 1158   LABSPEC 1.008 02/01/2011 1158   PHURINE 8.0 02/01/2011 1158   GLUCOSEU NEGATIVE 02/01/2011 1158   HGBUR NEGATIVE 02/01/2011 1158   BILIRUBINUR negative 11/05/2016 1038   BILIRUBINUR neg 09/14/2014 1308   KETONESUR negative 11/05/2016 1038   KETONESUR NEGATIVE 02/01/2011 1158   PROTEINUR negative 11/05/2016 1038   PROTEINUR neg 09/14/2014 1308   PROTEINUR NEGATIVE 02/01/2011 1158   UROBILINOGEN 0.2 11/05/2016 1038   UROBILINOGEN 0.2 02/01/2011 1158   NITRITE  Negative 11/05/2016 1038   NITRITE neg 09/14/2014 1308   NITRITE NEGATIVE 02/01/2011 1158   LEUKOCYTESUR Negative 11/05/2016 1038    STUDIES: Mammography at the Breast  Center 09/28/2016 found the breast density to be category B. There was no evidence of malignancy.  ASSESSMENT: 82 y.o.  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  (2) radiation 12/21/2014-01/11/2015: Left Breast / 42.56 Gy in 16 fractions   (3) anastrozole started September 2016  (a) bone density 09/27/2014 shows osteoporosis  (b) denosumab/Prolia started 05/19/2015, repeated every 6 months.  (c) repeat bone density 09/28/2016 showed a T score of -3.3; this was stable  PLAN: Katilin is now 2 years out from definitive surgery for her breast cancer with no evidence of disease recurrence. This is very favorable.  She is tolerating anastrozole well, and the plan is to continue that for a total of 5 years.  The big concern of course is her osteoporosis. She is receiving denosumab/Prolia every 6 months including a dose today. She is doing a good job with vitamin D supplementation. We will repeat a bone density in April 2020.  She will receive denosumab again in 6 months with lab work and then see me again a year from now with lab work and a visit. She will have her next mammogram in April of next year.  She knows to call for any problems that may develop before her next visit here.  Lowella Dell, MD   11/19/2016 10:18 AM Medical Oncology and Hematology [facility] [address] [place], [place] [address] Tel. [phone]    Fax. [phone]

## 2016-11-19 NOTE — Patient Instructions (Signed)
Denosumab injection  What is this medicine?  DENOSUMAB (den oh sue mab) slows bone breakdown. Prolia is used to treat osteoporosis in women after menopause and in men. Xgeva is used to prevent bone fractures and other bone problems caused by cancer bone metastases. Xgeva is also used to treat giant cell tumor of the bone.  This medicine may be used for other purposes; ask your health care provider or pharmacist if you have questions.  What should I tell my health care provider before I take this medicine?  They need to know if you have any of these conditions:  -dental disease  -eczema  -infection or history of infections  -kidney disease or on dialysis  -low blood calcium or vitamin D  -malabsorption syndrome  -scheduled to have surgery or tooth extraction  -taking medicine that contains denosumab  -thyroid or parathyroid disease  -an unusual reaction to denosumab, other medicines, foods, dyes, or preservatives  -pregnant or trying to get pregnant  -breast-feeding  How should I use this medicine?  This medicine is for injection under the skin. It is given by a health care professional in a hospital or clinic setting.  If you are getting Prolia, a special MedGuide will be given to you by the pharmacist with each prescription and refill. Be sure to read this information carefully each time.  For Prolia, talk to your pediatrician regarding the use of this medicine in children. Special care may be needed. For Xgeva, talk to your pediatrician regarding the use of this medicine in children. While this drug may be prescribed for children as young as 13 years for selected conditions, precautions do apply.  Overdosage: If you think you have taken too much of this medicine contact a poison control center or emergency room at once.  NOTE: This medicine is only for you. Do not share this medicine with others.  What if I miss a dose?  It is important not to miss your dose. Call your doctor or health care professional if you are  unable to keep an appointment.  What may interact with this medicine?  Do not take this medicine with any of the following medications:  -other medicines containing denosumab  This medicine may also interact with the following medications:  -medicines that suppress the immune system  -medicines that treat cancer  -steroid medicines like prednisone or cortisone  This list may not describe all possible interactions. Give your health care provider a list of all the medicines, herbs, non-prescription drugs, or dietary supplements you use. Also tell them if you smoke, drink alcohol, or use illegal drugs. Some items may interact with your medicine.  What should I watch for while using this medicine?  Visit your doctor or health care professional for regular checks on your progress. Your doctor or health care professional may order blood tests and other tests to see how you are doing.  Call your doctor or health care professional if you get a cold or other infection while receiving this medicine. Do not treat yourself. This medicine may decrease your body's ability to fight infection.  You should make sure you get enough calcium and vitamin D while you are taking this medicine, unless your doctor tells you not to. Discuss the foods you eat and the vitamins you take with your health care professional.  See your dentist regularly. Brush and floss your teeth as directed. Before you have any dental work done, tell your dentist you are receiving this medicine.  Do   not become pregnant while taking this medicine or for 5 months after stopping it. Women should inform their doctor if they wish to become pregnant or think they might be pregnant. There is a potential for serious side effects to an unborn child. Talk to your health care professional or pharmacist for more information.  What side effects may I notice from receiving this medicine?  Side effects that you should report to your doctor or health care professional as soon as  possible:  -allergic reactions like skin rash, itching or hives, swelling of the face, lips, or tongue  -breathing problems  -chest pain  -fast, irregular heartbeat  -feeling faint or lightheaded, falls  -fever, chills, or any other sign of infection  -muscle spasms, tightening, or twitches  -numbness or tingling  -skin blisters or bumps, or is dry, peels, or red  -slow healing or unexplained pain in the mouth or jaw  -unusual bleeding or bruising  Side effects that usually do not require medical attention (Report these to your doctor or health care professional if they continue or are bothersome.):  -muscle pain  -stomach upset, gas  This list may not describe all possible side effects. Call your doctor for medical advice about side effects. You may report side effects to FDA at 1-800-FDA-1088.  Where should I keep my medicine?  This medicine is only given in a clinic, doctor's office, or other health care setting and will not be stored at home.  NOTE: This sheet is a summary. It may not cover all possible information. If you have questions about this medicine, talk to your doctor, pharmacist, or health care provider.      2016, Elsevier/Gold Standard. (2011-11-19 12:37:47)

## 2016-12-11 IMAGING — US US BREAST BX W LOC DEV 1ST LESION IMG BX SPEC US GUIDE*L*
1 series · 13 of 15 positions shown · non-contrast
Comparison: 09/27/2014

ADDENDUM:
Pathology reveals Left Breast invasive mammary carcinoma with
extravasated mucin and ductal carcinoma in situ. This was found to
be concordant by Dr. Nomasibulele Moatshe. Pathology results were
discussed with the patient via telephone. The patient reported
tenderness at the biopsy site. Post biopsy instructions were
reviewed and questions were answered. The patient was encouraged to
call The [REDACTED] with any additional
questions and or concerns. The patient was referred to the Breast

Pathology results reported by Uwe Tessema RN on October 06, 2014.
CLINICAL DATA: Patient presents for ultrasound-guided core biopsy
of left breast mass.
EXAM:
ULTRASOUND GUIDED LEFT BREAST CORE NEEDLE BIOPSY

[Series 1: us breast bx w loc dev 1st lesion img bx spec us g · 0.06mm/px · 13 of 15 slices shown]
[im 1/15]
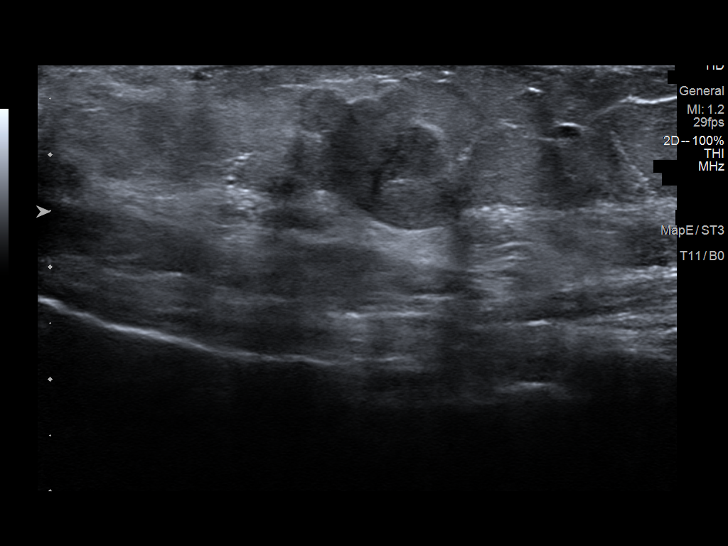
[im 2/15]
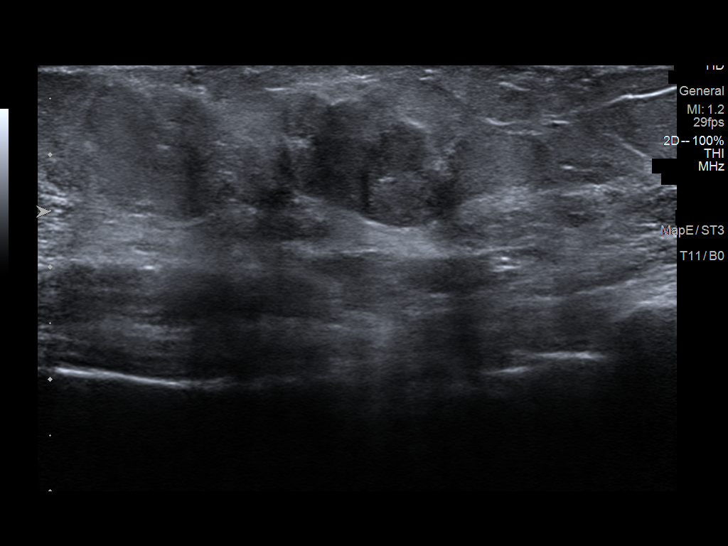
[im 3/15]
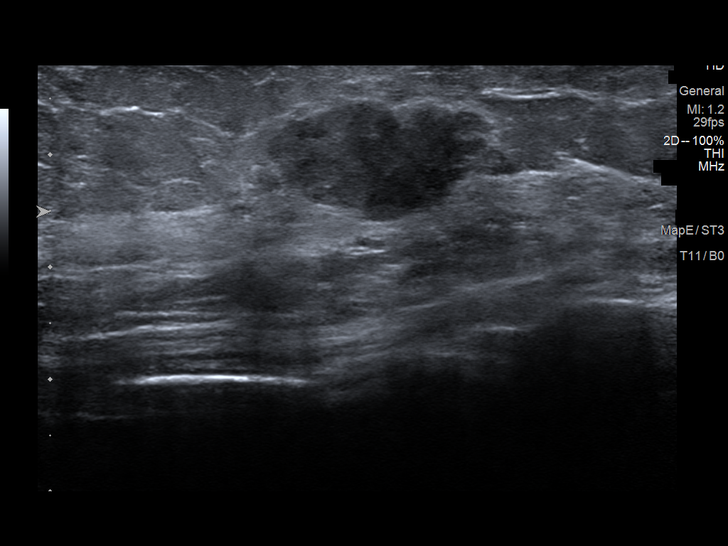
[im 5/15]
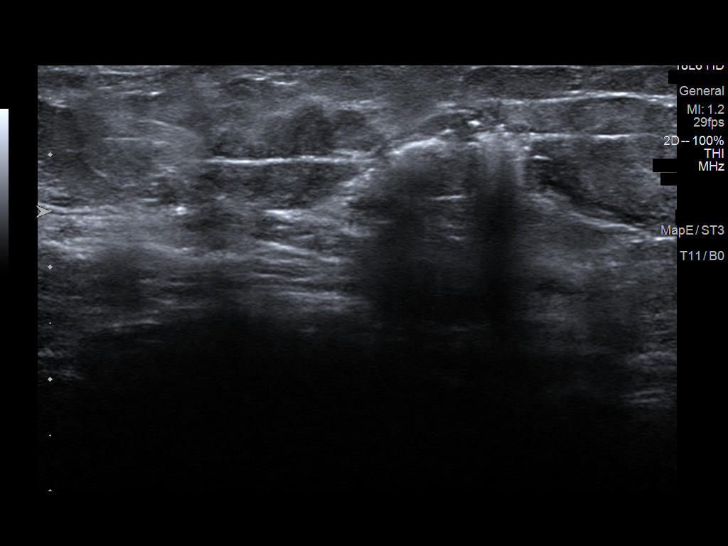
[im 6/15]
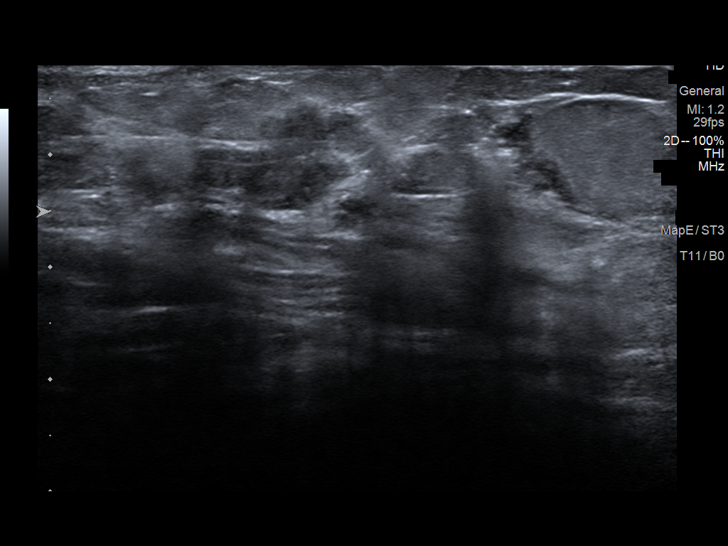
[im 7/15]
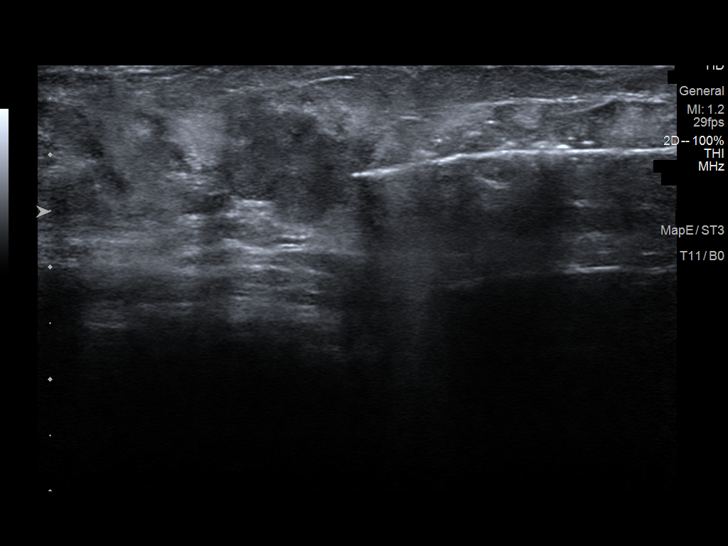
[im 8/15]
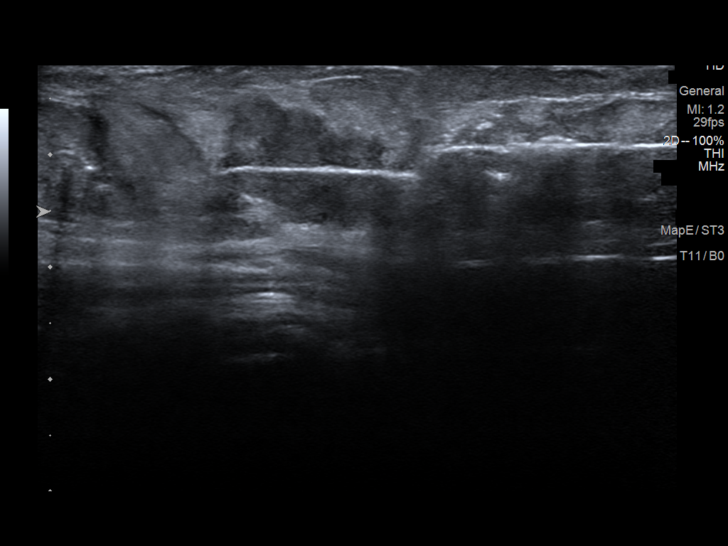
[im 9/15]
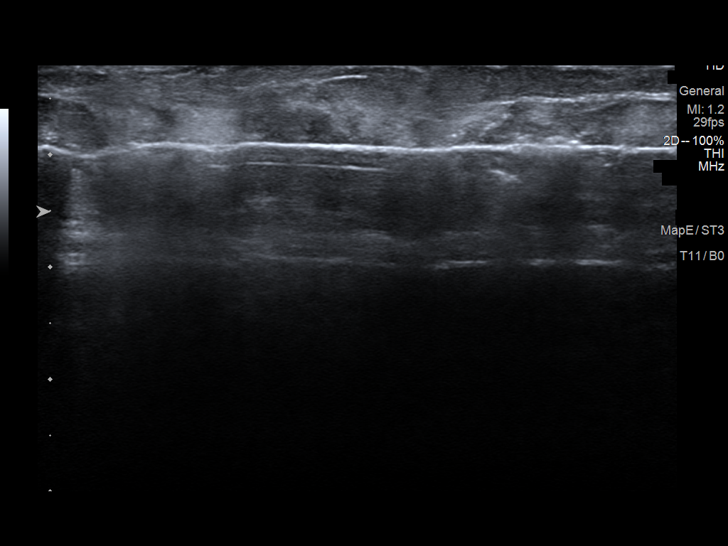
[im 10/15]
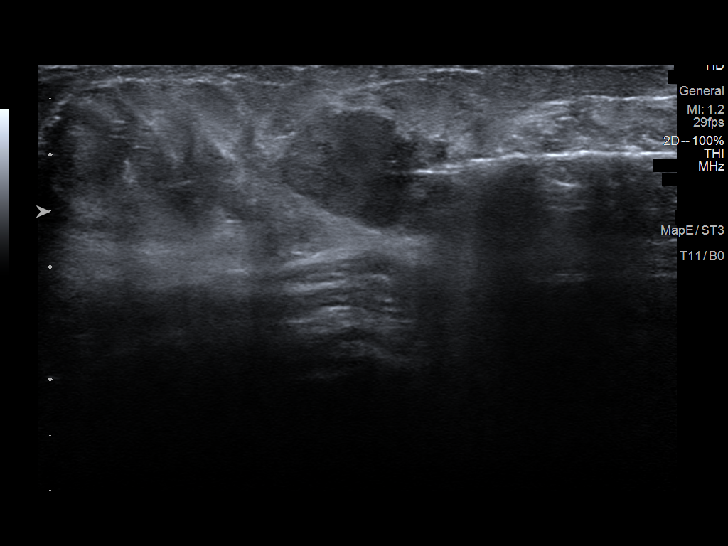
[im 11/15]
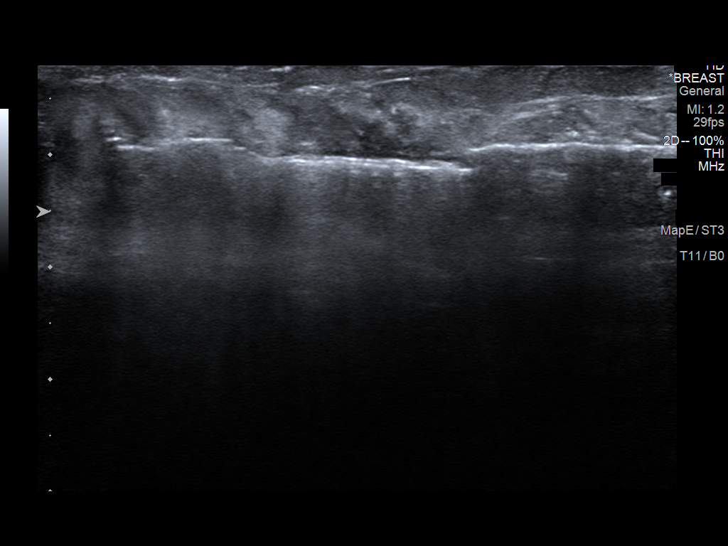
[im 13/15]
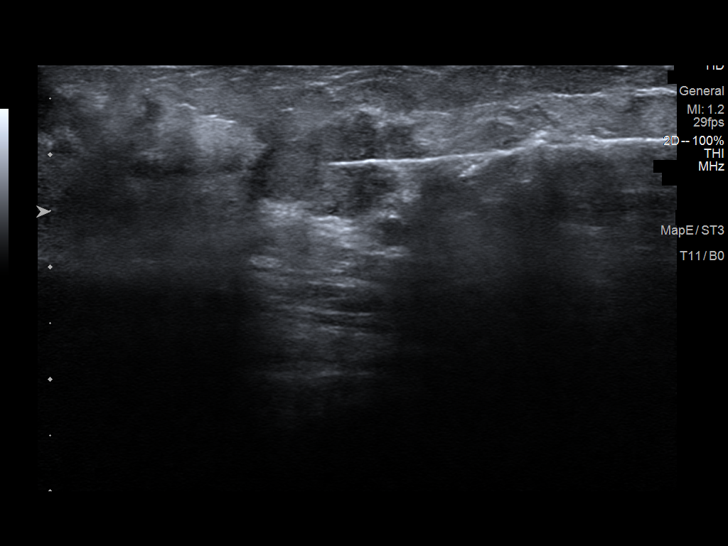
[im 14/15]
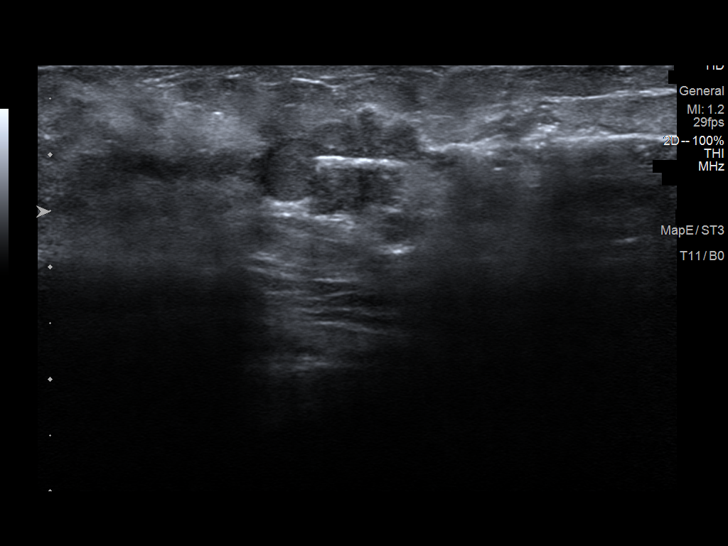
[im 15/15]
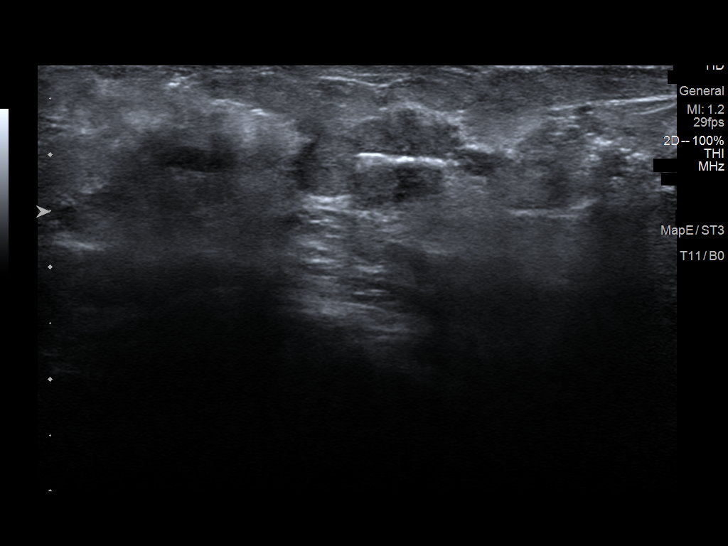

[13 of 15 positions shown; findings below may reference images not displayed]

PROCEDURE:
I met with the patient and we discussed the procedure of
ultrasound-guided biopsy, including benefits and alternatives. We
discussed the high likelihood of a successful procedure. We
discussed the risks of the procedure including infection, bleeding,
tissue injury, clip migration, and inadequate sampling. Informed
written consent was given. The usual time-out protocol was performed
immediately prior to the procedure.

Using sterile technique and 2% Lidocaine as local anesthetic, under
direct ultrasound visualization, a 12 gauge vacuum-assisted device
was used to perform biopsy of mass in the 2:30 o'clock location of
the left breastusing a lateral approach. At the conclusion of the
procedure, a coil shaped tissue marker clip was deployed into the
biopsy cavity. Follow-up 2-view mammogram was performed and dictated
separately.
IMPRESSION: Ultrasound-guided biopsy of left breast mass. No apparent
complications.

## 2017-01-25 ENCOUNTER — Other Ambulatory Visit: Payer: Self-pay | Admitting: Family Medicine

## 2017-01-27 NOTE — Telephone Encounter (Signed)
Pt is overdue for a f/u OV, not fasting, take medicines the day of visit. Please have her sched an appt within the next month so we can continue to make sure the medicines are keeping her healthy and safe before we refill them further. Thanks.

## 2017-01-28 ENCOUNTER — Other Ambulatory Visit: Payer: Self-pay | Admitting: Family Medicine

## 2017-01-28 NOTE — Telephone Encounter (Signed)
mychart message sent to pt about making an apt for refills. °

## 2017-02-28 ENCOUNTER — Ambulatory Visit (INDEPENDENT_AMBULATORY_CARE_PROVIDER_SITE_OTHER): Payer: Medicare Other | Admitting: Family Medicine

## 2017-02-28 ENCOUNTER — Encounter: Payer: Self-pay | Admitting: Family Medicine

## 2017-02-28 VITALS — BP 170/72 | HR 71 | Temp 97.8°F | Resp 16 | Ht 67.72 in | Wt 176.0 lb

## 2017-02-28 DIAGNOSIS — M818 Other osteoporosis without current pathological fracture: Secondary | ICD-10-CM | POA: Diagnosis not present

## 2017-02-28 DIAGNOSIS — I10 Essential (primary) hypertension: Secondary | ICD-10-CM

## 2017-02-28 MED ORDER — LOSARTAN POTASSIUM 100 MG PO TABS: 100.0000 mg | ORAL_TABLET | Freq: Every day | ORAL | 1 refills | Status: DC

## 2017-02-28 NOTE — Progress Notes (Signed)
Subjective:    Patient ID: [name], female    DOB: May 29, 1935, 82 y.o.   MRN: [id number]   Chief Complaint  Patient presents with  . Hypertension    follow-up   . Medication Refill    losartan    HPI  HTN: At AWV 3 mos prior,  BP was 200/73 and 201/87 the week prior. eGFR 64, cmp nml 3 mos prior.  1. Breast cancer - left lower outer, estrogen receptor positive found during her AWV last yr. Mammogram 2 mos ago was normal. 2. HTN - lisinopril caused nocturnal cough so changed to losartan - increased to 100 mg 06/2016. Checks BP at home 140-150s/70s with occ 481-856D systolic.  baseline eGFR 14-97 3. Osteoporosis - DEXA 09/2014 showed osteoporosis with T score of -5.1 at lumbar and -2.5 at hip, being managed by pt's oncologist Dr. [name] being treated by prolia inj every 6 mos. Repeat dexa in 2 years (after Dec 2018) to see if need to cont. She was instructed to start chewing  occ tums for calcium which she does sometimes but does a calcium 500/magnesium/zinc mixture once a day.  Vit D level was normal at 33 18 mos prior - taking 5000u/d and her leg stiffness resolved. She is also taking magnesium sometimes which produces loose bm.  4. HPL - Lipids 4 mos piror LDL 172 -> 200 ->189 with non-hdl of 197 -> 227 ->214.  Pt refuses statin ("no way I would every consider it") despite my strong recomendation so taking fish oil and states trying to follow a Mediterranean diet in addition to a "bergomot fruit" supplement from [place].  High-sensitivity C-reactive protein 07/03/15 was 1.71 show patient was at average risk.  Also doing fruit juice with berries and almond milk, kale/spinach with smoothies or a juicer with carrots and celery.   5. Pre-DM -  Lab Results  Component Value Date   HGBA1C 5.7 (H) 11/05/2016   HGBA1C 5.8 (H) 07/02/2016   HGBA1C 6.0 11/17/2015   Past Medical History:  Diagnosis Date  . Anxiety   . Breast cancer ([place])   . Breast cancer of lower-outer quadrant of  left female breast ([place]) 10/11/2014  . GERD (gastroesophageal reflux disease)   . Hypertension   . Personal history of radiation therapy    2016  . PONV (postoperative nausea and vomiting)   . PUD (peptic ulcer disease)   . Stone, kidney    Past Surgical History:  Procedure Laterality Date  . BREAST LUMPECTOMY WITH SENTINEL LYMPH NODE BIOPSY Left 10/22/2014   Procedure: BREAST LUMPECTOMY WITH SENTINEL LYMPH NODE BIOPSY;  Surgeon: Autumn Messing III, MD;  Location: [facility];  Service: General;  Laterality: Left;  . RE-EXCISION OF BREAST CANCER,SUPERIOR MARGINS Left 11/04/2014   Procedure: RE-EXCISION OF LEFT BREAST INFERIOR MARGINS;  Surgeon: Autumn Messing III, MD;  Location: [facility];  Service: General;  Laterality: Left;   Current Outpatient Prescriptions on File Prior to Visit  Medication Sig Dispense Refill  . anastrozole (ARIMIDEX) 1 MG tablet TAKE ONE TABLET BY MOUTH ONCE DAILY 90 tablet 4  . Ascorbic Acid (VITAMIN C) 1000 MG tablet Take 1,000 mg by mouth daily.    Marland Kitchen aspirin 81 MG tablet Take 81 mg by mouth 3 (three) times a week.     . Calcium Carb-Cholecalciferol (CALCIUM + D3) 600-800 MG-UNIT TABS Take by mouth.    . cholecalciferol (VITAMIN D) 1000 UNITS tablet Take 1,000 Units by mouth daily. Over the counter    .  co-enzyme Q-10 30 MG capsule Take 30 mg by mouth 3 (three) times daily.    . fish oil-omega-3 fatty acids 1000 MG capsule Take 1 g by mouth daily.    Marland Kitchen GARLIC PO Take by mouth daily.    . magnesium gluconate (MAGONATE) 500 MG tablet Take 500 mg by mouth 2 (two) times daily.    . Multiple Vitamin (MULTIVITAMIN) tablet Take 1 tablet by mouth daily.     No current facility-administered medications on file prior to visit.    Allergies  Allergen Reactions  . Lisinopril     D/c lisinopril due to cough   Family History  Problem Relation Age of Onset  . Pancreatic cancer Mother        pancreastic  . Stroke Father   . Hypertension Brother   . Hypertension Sister     . Hypertension Brother   . Heart disease Brother   . Hypertension Sister    Social History   Social History  . Marital status: Married    Spouse name: N/A  . Number of children: N/A  . Years of education: N/A   Occupational History  . retired    Social History Main Topics  . Smoking status: Never Smoker  . Smokeless tobacco: Never Used  . Alcohol use No  . Drug use: No  . Sexual activity: Not Asked   Other Topics Concern  . None   Social History Narrative  . None   Depression screen [facility] 2/9 02/28/2017 11/05/2016 07/02/2016 11/17/2015 12/01/2014  Decreased Interest 0 0 0 0 0  Down, Depressed, Hopeless 0 0 0 0 0  PHQ - 2 Score 0 0 0 0 0    Review of Systems  Constitutional: Positive for fatigue. Negative for appetite change, chills, diaphoresis and fever.  Eyes: Negative for visual disturbance.  Respiratory: Negative for cough and shortness of breath.   Cardiovascular: Negative for chest pain, palpitations and leg swelling.  Genitourinary: Negative for decreased urine volume.  Neurological: Negative for syncope and headaches.  Hematological: Does not bruise/bleed easily.       Objective:   Physical Exam  Constitutional: She is oriented to person, place, and time. She appears well-developed and well-nourished. No distress.  HENT:  Head: Normocephalic and atraumatic.  Right Ear: External ear normal.  Left Ear: External ear normal.  Eyes: Conjunctivae are normal. No scleral icterus.  Neck: Normal range of motion. Neck supple. No thyromegaly present.  Cardiovascular: Normal rate, regular rhythm, normal heart sounds and intact distal pulses.   Pulmonary/Chest: Effort normal and breath sounds normal. No respiratory distress.  Musculoskeletal: She exhibits no edema.  Lymphadenopathy:    She has no cervical adenopathy.  Neurological: She is alert and oriented to person, place, and time.  Skin: Skin is warm and dry. She is not diaphoretic. No erythema.  Psychiatric: She  has a normal mood and affect. Her behavior is normal.      BP (!) 170/72   Pulse 71   Temp 97.8 F (36.6 C) (Oral)   Resp 16   Ht 5' 7.72" (1.72 m)   Wt 176 lb (79.8 kg)   SpO2 97%   BMI 26.98 kg/m      Assessment & Plan:   1. HTN (hypertension), benign   Rec changing losartan 100 to candesartan 32 mg as latter may be more effective but pt very resistant to any med change (and won't take anything that has a high mg # - despite repeated explanations, she  thinks the mg is comparable between different meds). Pt declines for now but agrees to re-address at f/u. She will cont to monitor her BP at home. Excellent diet  2. Osteoporosis - increase calcium supp to 500 bid.  Has appts w/ me for 2 mos (12/6) and 4 mo (1/31) both state for fasting labs and BP check. AWV due June 2019.  Meds ordered this encounter  Medications  . losartan (COZAAR) 100 MG tablet    Sig: Take 1 tablet (100 mg total) by mouth daily.    Dispense:  90 tablet    Refill:  1    Delman Cheadle, M.D.  Primary Care at [facility] [address] [place], [place] [address] [phone] phone [phone] fax  03/02/17 12:58 PM

## 2017-02-28 NOTE — Patient Instructions (Addendum)
Consider changing losartan 100mg  to its sister medication candasartan 32 mg once a day which I am hoping would be slightly more effective for blood pressure lowering.  Increase your calcium 500mg  supplement to twice daily.    IF you received an x-ray today, you will receive an invoice from [facility]. Please contact [facility] at [phone] with questions or concerns regarding your invoice.   IF you received labwork today, you will receive an invoice from [facility]. Please contact [facility] at [phone] with questions or concerns regarding your invoice.   Our billing staff will not be able to assist you with questions regarding bills from these companies.  You will be contacted with the lab results as soon as they are available. The fastest way to get your results is to activate your My Chart account. Instructions are located on the last page of this paperwork. If you have not heard from [place] regarding the results in 2 weeks, please contact this office.     Calcium Intake Recommendations Calcium is a mineral that affects many functions in the body, including:  Blood clotting.  Blood vessel function.  Nerve impulse conduction.  Hormone secretion.  Muscle contraction.  Bone and teeth functions.  Most of your body's calcium supply is stored in your bones and teeth. When your calcium stores are low, you may be at risk for low bone mass, bone loss, and bone fractures. Consuming enough calcium helps to grow healthy bones and teeth and to prevent breakdown over time. It is very important that you get enough calcium if you are:  A child undergoing rapid growth.  An adolescent girl.  A pre- or post-menopausal woman.  A woman whose menstrual cycle has stopped due to anorexia nervosa or regular intense exercise.  An individual with lactose intolerance or a milk allergy.  A vegetarian.  What is my plan? Try to consume the recommended amount of calcium daily  based on your age. Depending on your overall health, your health care provider may recommend increased calcium intake.General daily calcium intake recommendations by age are:  Birth to 6 months: 200 mg.  Infants 7 to 12 months: 260 mg.  Children 1 to 3 years: 700 mg.  Children 4 to 8 years: 1,000 mg.  Children 9 to 13 years: 1,300 mg.  Teens 14 to 18 years: 1,300 mg.  Adults 19 to 50 years: 1,000 mg.  Adult women 51 to 70 years: 1,200 mg.  Adult men 51 to 70 years: 1,000 mg.  Adults 71 years and older: 1,200 mg.  Pregnant and breastfeeding teens: 1,300 mg.  Pregnant and breastfeeding adults: 1,000 mg.  What do I need to know about calcium intake?  In order for the body to absorb calcium, it needs vitamin D. You can get vitamin D through: ? Direct exposure of the skin to sunlight. ? Foods, such as egg yolks, liver, saltwater fish, and fortified milk. ? Supplements.  Consuming too much calcium may cause: ? Constipation. ? Decreased absorption of iron and zinc. ? Kidney stones.  Calcium supplements may interact with certain medicines. Check with your health care provider before starting any calcium supplements.  Try to get most of your calcium from food. What foods can I eat? Grains  Fortified oatmeal. Fortified ready-to-eat cereals. Fortified frozen waffles. Vegetables Turnip greens. Broccoli. Fruits Fortified orange juice. Meats and Other Protein Sources Canned sardines with bones. Canned salmon with bones. Soy beans. Tofu. Baked beans. Almonds. [place] nuts. Sunflower seeds. Dairy Milk. Yogurt. Cheese. [employer]  cheese. Beverages Fortified soy milk. Fortified rice milk. Sweets/Desserts Pudding. Ice Cream. Milkshakes. Blackstrap molasses. The items listed above may not be a complete list of recommended foods or beverages. Contact your dietitian for more options. What foods can affect my calcium intake? It may be more difficult for your body to use calcium or  calcium may leave your body more quickly if you consume large amounts of:  Sodium.  Protein.  Caffeine.  Alcohol.  This information is not intended to replace advice given to you by your health care provider. Make sure you discuss any questions you have with your health care provider. Document Released: 01/03/2004 Document Revised: 12/09/2015 Document Reviewed: 10/27/2013 Elsevier Interactive Patient Education  2018 [place] protect organs, store calcium, and anchor muscles. Good health habits, such as eating nutritious foods and exercising regularly, are important for maintaining healthy bones. They can also help to prevent a condition that causes bones to lose density and become weak and brittle (osteoporosis). Why is bone mass important? Bone mass refers to the amount of bone tissue that you have. The higher your bone mass, the stronger your bones. An important step toward having healthy bones throughout life is to have strong and dense bones during childhood. A young adult who has a high bone mass is more likely to have a high bone mass later in life. Bone mass at its greatest it is called peak bone mass. A large decline in bone mass occurs in older adults. In women, it occurs about the time of menopause. During this time, it is important to practice good health habits, because if more bone is lost than what is replaced, the bones will become less healthy and more likely to break (fracture). If you find that you have a low bone mass, you may be able to prevent osteoporosis or further bone loss by changing your diet and lifestyle. How can I find out if my bone mass is low? Bone mass can be measured with an X-ray test that is called a bone mineral density (BMD) test. This test is recommended for all women who are age 29 or older. It may also be recommended for men who are age 56 or older, or for people who are more likely to develop osteoporosis due to:  Having bones  that break easily.  Having a long-term disease that weakens bones, such as kidney disease or rheumatoid arthritis.  Having menopause earlier than normal.  Taking medicine that weakens bones, such as steroids, thyroid hormones, or hormone treatment for breast cancer or prostate cancer.  Smoking.  Drinking three or more alcoholic drinks each day.  What are the nutritional recommendations for healthy bones? To have healthy bones, you need to get enough of the right minerals and vitamins. Most nutrition experts recommend getting these nutrients from the foods that you eat. Nutritional recommendations vary from person to person. Ask your health care provider what is healthy for you. Here are some general guidelines. Calcium Recommendations Calcium is the most important (essential) mineral for bone health. Most people can get enough calcium from their diet, but supplements may be recommended for people who are at risk for osteoporosis. Good sources of calcium include:  Dairy products, such as low-fat or nonfat milk, cheese, and yogurt.  Dark green leafy vegetables, such as bok choy and broccoli.  Calcium-fortified foods, such as orange juice, cereal, bread, soy beverages, and tofu products.  Nuts, such as almonds.  Follow these recommended amounts for daily  calcium intake:  Children, age 9?3: 700 mg.  Children, age 67?8: 1,000 mg.  Children, age 66?13: 1,300 mg.  Teens, age 71?18: 1,300 mg.  Adults, age 77?50: 1,000 mg.  Adults, age 73?70: ? Men: 1,000 mg. ? Women: 1,200 mg.  Adults, age 92 or older: 1,200 mg.  Pregnant and breastfeeding females: ? Teens: 1,300 mg. ? Adults: 1,000 mg.  Vitamin D Recommendations Vitamin D is the most essential vitamin for bone health. It helps the body to absorb calcium. Sunlight stimulates the skin to make vitamin D, so be sure to get enough sunlight. If you live in a cold climate or you do not get outside often, your health care provider may  recommend that you take vitamin D supplements. Good sources of vitamin D in your diet include:  Egg yolks.  Saltwater fish.  Milk and cereal fortified with vitamin D.  Follow these recommended amounts for daily vitamin D intake:  Children and teens, age 63?18: 32 international units.  Adults, age 2 or younger: 400-800 international units.  Adults, age 38 or older: 800-1,000 international units.  Other Nutrients Other nutrients for bone health include:  Phosphorus. This mineral is found in meat, poultry, dairy foods, nuts, and legumes. The recommended daily intake for adult men and adult women is 700 mg.  Magnesium. This mineral is found in seeds, nuts, dark green vegetables, and legumes. The recommended daily intake for adult men is 400?420 mg. For adult women, it is 310?320 mg.  Vitamin K. This vitamin is found in green leafy vegetables. The recommended daily intake is 120 mg for adult men and 90 mg for adult women.  What type of physical activity is best for building and maintaining healthy bones? Weight-bearing and strength-building activities are important for building and maintaining peak bone mass. Weight-bearing activities cause muscles and bones to work against gravity. Strength-building activities increases muscle strength that supports bones. Weight-bearing and muscle-building activities include:  Walking and hiking.  Jogging and running.  Dancing.  Gym exercises.  Lifting weights.  Tennis and racquetball.  Climbing stairs.  Aerobics.  Adults should get at least 30 minutes of moderate physical activity on most days. Children should get at least 60 minutes of moderate physical activity on most days. Ask your health care provide what type of exercise is best for you. Where can I find more information? For more information, check out the following websites:  [place]: YardHomes.se  [employer] of Health:  http://www.niams.AnonymousEar.fr.asp  This information is not intended to replace advice given to you by your health care provider. Make sure you discuss any questions you have with your health care provider. Document Released: 08/11/2003 Document Revised: 12/09/2015 Document Reviewed: 05/26/2014 Elsevier Interactive Patient Education  [employer].

## 2017-04-04 ENCOUNTER — Other Ambulatory Visit: Payer: Self-pay | Admitting: Oncology

## 2017-04-18 ENCOUNTER — Telehealth: Payer: Self-pay | Admitting: Family Medicine

## 2017-04-18 NOTE — Telephone Encounter (Signed)
See attached message re: Losartan

## 2017-04-18 NOTE — Telephone Encounter (Signed)
Copied from [place] [id number]. Topic: Quick Communication - See Telephone Encounter >> Apr 18, 2017  9:53 AM Ahmed Prima L wrote: CRM for notification. See Telephone encounter for:  Patient said she is currently taking losartan, she wants to know if she needs to stop taking it due to the recall & can something else be called in for this replacement, if so. Call her home phone [phone] 04/18/17.

## 2017-04-19 NOTE — Telephone Encounter (Signed)
Spoke to pt. Advised to call pharmacy, they know NDC and lot numbers and manufacturer. Pt agreed

## 2017-05-09 ENCOUNTER — Ambulatory Visit: Payer: Medicare Other | Admitting: Family Medicine

## 2017-05-21 ENCOUNTER — Other Ambulatory Visit (HOSPITAL_BASED_OUTPATIENT_CLINIC_OR_DEPARTMENT_OTHER): Payer: Medicare Other

## 2017-05-21 ENCOUNTER — Ambulatory Visit (HOSPITAL_BASED_OUTPATIENT_CLINIC_OR_DEPARTMENT_OTHER): Payer: Medicare Other

## 2017-05-21 VITALS — BP 170/88 | HR 72 | Temp 98.3°F | Resp 20

## 2017-05-21 DIAGNOSIS — M818 Other osteoporosis without current pathological fracture: Secondary | ICD-10-CM

## 2017-05-21 DIAGNOSIS — M810 Age-related osteoporosis without current pathological fracture: Secondary | ICD-10-CM | POA: Diagnosis not present

## 2017-05-21 DIAGNOSIS — C50512 Malignant neoplasm of lower-outer quadrant of left female breast: Secondary | ICD-10-CM

## 2017-05-21 DIAGNOSIS — Z170 Estrogen receptor positive status [ER+]: Principal | ICD-10-CM

## 2017-05-21 LAB — CBC WITH DIFFERENTIAL/PLATELET
BASO%: 0.9 % (ref 0.0–2.0)
BASOS ABS: 0 10*3/uL (ref 0.0–0.1)
EOS ABS: 0.2 10*3/uL (ref 0.0–0.5)
EOS%: 3.5 % (ref 0.0–7.0)
HEMATOCRIT: 35.7 % (ref 34.8–46.6)
HGB: 11.4 g/dL — ABNORMAL LOW (ref 11.6–15.9)
LYMPH#: 1 10*3/uL (ref 0.9–3.3)
LYMPH%: 21.8 % (ref 14.0–49.7)
MCH: 29.9 pg (ref 25.1–34.0)
MCHC: 31.9 g/dL (ref 31.5–36.0)
MCV: 93.7 fL (ref 79.5–101.0)
MONO#: 0.4 10*3/uL (ref 0.1–0.9)
MONO%: 8.2 % (ref 0.0–14.0)
NEUT#: 3 10*3/uL (ref 1.5–6.5)
NEUT%: 65.6 % (ref 38.4–76.8)
PLATELETS: 200 10*3/uL (ref 145–400)
RBC: 3.81 10*6/uL (ref 3.70–5.45)
RDW: 12.4 % (ref 11.2–14.5)
WBC: 4.6 10*3/uL (ref 3.9–10.3)

## 2017-05-21 LAB — COMPREHENSIVE METABOLIC PANEL
ALT: 14 U/L (ref 0–55)
AST: 18 U/L (ref 5–34)
Albumin: 3.7 g/dL (ref 3.5–5.0)
Alkaline Phosphatase: 42 U/L (ref 40–150)
Anion Gap: 7 mEq/L (ref 3–11)
BUN: 16.7 mg/dL (ref 7.0–26.0)
CHLORIDE: 110 meq/L — AB (ref 98–109)
CO2: 26 meq/L (ref 22–29)
Calcium: 10.3 mg/dL (ref 8.4–10.4)
Creatinine: 1.1 mg/dL (ref 0.6–1.1)
EGFR: 56 mL/min/{1.73_m2} — AB (ref >60)
GLUCOSE: 86 mg/dL (ref 70–140)
POTASSIUM: 4 meq/L (ref 3.5–5.1)
SODIUM: 143 meq/L (ref 136–145)
Total Bilirubin: 0.61 mg/dL (ref 0.20–1.20)
Total Protein: 6.9 g/dL (ref 6.4–8.3)

## 2017-05-21 MED ORDER — DENOSUMAB 60 MG/ML ~~LOC~~ SOLN
60.0000 mg | Freq: Once | SUBCUTANEOUS | Status: AC
  Administered 2017-05-21: 60 mg via SUBCUTANEOUS

## 2017-05-21 NOTE — Patient Instructions (Signed)
Denosumab injection  What is this medicine?  DENOSUMAB (den oh sue mab) slows bone breakdown. Prolia is used to treat osteoporosis in women after menopause and in men. Xgeva is used to prevent bone fractures and other bone problems caused by cancer bone metastases. Xgeva is also used to treat giant cell tumor of the bone.  This medicine may be used for other purposes; ask your health care provider or pharmacist if you have questions.  What should I tell my health care provider before I take this medicine?  They need to know if you have any of these conditions:  -dental disease  -eczema  -infection or history of infections  -kidney disease or on dialysis  -low blood calcium or vitamin D  -malabsorption syndrome  -scheduled to have surgery or tooth extraction  -taking medicine that contains denosumab  -thyroid or parathyroid disease  -an unusual reaction to denosumab, other medicines, foods, dyes, or preservatives  -pregnant or trying to get pregnant  -breast-feeding  How should I use this medicine?  This medicine is for injection under the skin. It is given by a health care professional in a hospital or clinic setting.  If you are getting Prolia, a special MedGuide will be given to you by the pharmacist with each prescription and refill. Be sure to read this information carefully each time.  For Prolia, talk to your pediatrician regarding the use of this medicine in children. Special care may be needed. For Xgeva, talk to your pediatrician regarding the use of this medicine in children. While this drug may be prescribed for children as young as 13 years for selected conditions, precautions do apply.  Overdosage: If you think you have taken too much of this medicine contact a poison control center or emergency room at once.  NOTE: This medicine is only for you. Do not share this medicine with others.  What if I miss a dose?  It is important not to miss your dose. Call your doctor or health care professional if you are  unable to keep an appointment.  What may interact with this medicine?  Do not take this medicine with any of the following medications:  -other medicines containing denosumab  This medicine may also interact with the following medications:  -medicines that suppress the immune system  -medicines that treat cancer  -steroid medicines like prednisone or cortisone  This list may not describe all possible interactions. Give your health care provider a list of all the medicines, herbs, non-prescription drugs, or dietary supplements you use. Also tell them if you smoke, drink alcohol, or use illegal drugs. Some items may interact with your medicine.  What should I watch for while using this medicine?  Visit your doctor or health care professional for regular checks on your progress. Your doctor or health care professional may order blood tests and other tests to see how you are doing.  Call your doctor or health care professional if you get a cold or other infection while receiving this medicine. Do not treat yourself. This medicine may decrease your body's ability to fight infection.  You should make sure you get enough calcium and vitamin D while you are taking this medicine, unless your doctor tells you not to. Discuss the foods you eat and the vitamins you take with your health care professional.  See your dentist regularly. Brush and floss your teeth as directed. Before you have any dental work done, tell your dentist you are receiving this medicine.  Do   not become pregnant while taking this medicine or for 5 months after stopping it. Women should inform their doctor if they wish to become pregnant or think they might be pregnant. There is a potential for serious side effects to an unborn child. Talk to your health care professional or pharmacist for more information.  What side effects may I notice from receiving this medicine?  Side effects that you should report to your doctor or health care professional as soon as  possible:  -allergic reactions like skin rash, itching or hives, swelling of the face, lips, or tongue  -breathing problems  -chest pain  -fast, irregular heartbeat  -feeling faint or lightheaded, falls  -fever, chills, or any other sign of infection  -muscle spasms, tightening, or twitches  -numbness or tingling  -skin blisters or bumps, or is dry, peels, or red  -slow healing or unexplained pain in the mouth or jaw  -unusual bleeding or bruising  Side effects that usually do not require medical attention (Report these to your doctor or health care professional if they continue or are bothersome.):  -muscle pain  -stomach upset, gas  This list may not describe all possible side effects. Call your doctor for medical advice about side effects. You may report side effects to FDA at 1-800-FDA-1088.  Where should I keep my medicine?  This medicine is only given in a clinic, doctor's office, or other health care setting and will not be stored at home.  NOTE: This sheet is a summary. It may not cover all possible information. If you have questions about this medicine, talk to your doctor, pharmacist, or health care provider.      2016, Elsevier/Gold Standard. (2011-11-19 12:37:47)

## 2017-07-04 ENCOUNTER — Ambulatory Visit: Payer: Medicare Other | Admitting: Family Medicine

## 2017-07-10 ENCOUNTER — Telehealth: Payer: Self-pay | Admitting: Family Medicine

## 2017-07-10 NOTE — Telephone Encounter (Signed)
No DPR on file. Tried to call and remind pt of their apt tomorrow. If they call back, please advise them of their apt time, building and 15 minutes early as well as the 10 minute late policy.

## 2017-07-11 ENCOUNTER — Ambulatory Visit (INDEPENDENT_AMBULATORY_CARE_PROVIDER_SITE_OTHER): Payer: Medicare Other | Admitting: Family Medicine

## 2017-07-11 ENCOUNTER — Encounter: Payer: Self-pay | Admitting: Family Medicine

## 2017-07-11 ENCOUNTER — Other Ambulatory Visit: Payer: Self-pay

## 2017-07-11 VITALS — BP 140/90 | HR 74 | Temp 98.0°F | Resp 18 | Ht 67.72 in | Wt 177.8 lb

## 2017-07-11 DIAGNOSIS — I10 Essential (primary) hypertension: Secondary | ICD-10-CM

## 2017-07-11 DIAGNOSIS — C50512 Malignant neoplasm of lower-outer quadrant of left female breast: Secondary | ICD-10-CM

## 2017-07-11 DIAGNOSIS — K279 Peptic ulcer, site unspecified, unspecified as acute or chronic, without hemorrhage or perforation: Secondary | ICD-10-CM

## 2017-07-11 DIAGNOSIS — M818 Other osteoporosis without current pathological fracture: Secondary | ICD-10-CM

## 2017-07-11 DIAGNOSIS — Z170 Estrogen receptor positive status [ER+]: Secondary | ICD-10-CM

## 2017-07-11 DIAGNOSIS — E7800 Pure hypercholesterolemia, unspecified: Secondary | ICD-10-CM

## 2017-07-11 DIAGNOSIS — R7303 Prediabetes: Secondary | ICD-10-CM

## 2017-07-11 NOTE — Patient Instructions (Addendum)
IF you received an x-ray today, you will receive an invoice from [facility]. Please contact [facility] at [phone] with questions or concerns regarding your invoice.   IF you received labwork today, you will receive an invoice from [facility]. Please contact [facility] at [phone] with questions or concerns regarding your invoice.   Our billing staff will not be able to assist you with questions regarding bills from these companies.  You will be contacted with the lab results as soon as they are available. The fastest way to get your results is to activate your My Chart account. Instructions are located on the last page of this paperwork. If you have not heard from [place] regarding the results in 2 weeks, please contact this office.    Mediterranean Diet A Mediterranean diet refers to food and lifestyle choices that are based on the traditions of countries located on the [employer]. This way of eating has been shown to help prevent certain conditions and improve outcomes for people who have chronic diseases, like kidney disease and heart disease. What are tips for following this plan? Lifestyle Cook and eat meals together with your family, when possible. Drink enough fluid to keep your urine clear or pale yellow. Be physically active every day. This includes: Aerobic exercise like running or swimming. Leisure activities like gardening, walking, or housework. Get 7-8 hours of sleep each night. If recommended by your health care provider, drink red wine in moderation. This means 1 glass a day for nonpregnant women and 2 glasses a day for men. A glass of wine equals 5 oz (150 mL). Reading food labels Check the serving size of packaged foods. For foods such as rice and pasta, the serving size refers to the amount of cooked product, not dry. Check the total fat in packaged foods. Avoid foods that have saturated fat or trans fats. Check the ingredients list for  added sugars, such as corn syrup. Shopping At the grocery store, buy most of your food from the areas near the walls of the store. This includes: Fresh fruits and vegetables (produce). Grains, beans, nuts, and seeds. Some of these may be available in unpackaged forms or large amounts (in bulk). Fresh seafood. Poultry and eggs. Low-fat dairy products. Buy whole ingredients instead of prepackaged foods. Buy fresh fruits and vegetables in-season from local farmers markets. Buy frozen fruits and vegetables in resealable bags. If you do not have access to quality fresh seafood, buy precooked frozen shrimp or canned fish, such as tuna, salmon, or sardines. Buy small amounts of raw or cooked vegetables, salads, or olives from the deli or salad bar at your store. Stock your pantry so you always have certain foods on hand, such as olive oil, canned tuna, canned tomatoes, rice, pasta, and beans. Cooking Cook foods with extra-virgin olive oil instead of using butter or other vegetable oils. Have meat as a side dish, and have vegetables or grains as your main dish. This means having meat in small portions or adding small amounts of meat to foods like pasta or stew. Use beans or vegetables instead of meat in common dishes like chili or lasagna. Experiment with different cooking methods. Try roasting or broiling vegetables instead of steaming or sauteing them. Add frozen vegetables to soups, stews, pasta, or rice. Add nuts or seeds for added healthy fat at each meal. You can add these to yogurt, salads, or vegetable dishes. Marinate fish or vegetables using olive oil, lemon juice, garlic, and fresh  herbs. Meal planning Plan to eat 1 vegetarian meal one day each week. Try to work up to 2 vegetarian meals, if possible. Eat seafood 2 or more times a week. Have healthy snacks readily available, such as: Vegetable sticks with hummus. Greek yogurt. Fruit and nut trail mix. Eat balanced meals throughout the  week. This includes: Fruit: 2-3 servings a day Vegetables: 4-5 servings a day Low-fat dairy: 2 servings a day Fish, poultry, or lean meat: 1 serving a day Beans and legumes: 2 or more servings a week Nuts and seeds: 1-2 servings a day Whole grains: 6-8 servings a day Extra-virgin olive oil: 3-4 servings a day Limit red meat and sweets to only a few servings a month What are my food choices? Mediterranean diet Recommended Grains: Whole-grain pasta. Brown rice. Bulgar wheat. Polenta. Couscous. Whole-wheat bread. [place]. Vegetables: Artichokes. Beets. Broccoli. Cabbage. Carrots. Eggplant. Green beans. Chard. Kale. Spinach. Onions. Leeks. Peas. Squash. Tomatoes. Peppers. Radishes. Fruits: Apples. Apricots. Avocado. Berries. Bananas. Cherries. Dates. Figs. Grapes. Lemons. Melon. Oranges. Peaches. Plums. Pomegranate. Meats and other protein foods: Beans. Almonds. Sunflower seeds. Pine nuts. Peanuts. [place]. Salmon. Scallops. Shrimp. [place]. Tilapia. Clams. Oysters. Eggs. Dairy: Low-fat milk. Cheese. Greek yogurt. Beverages: Water. Red wine. Herbal tea. Fats and oils: Extra virgin olive oil. Avocado oil. Grape seed oil. Sweets and desserts: [place] yogurt with honey. Baked apples. Poached pears. Trail mix. Seasoning and other foods: Basil. Cilantro. Coriander. Cumin. Mint. Parsley. Sage. Rosemary. Tarragon. Garlic. Oregano. Thyme. Pepper. Balsalmic vinegar. Tahini. Hummus. Tomato sauce. Olives. Mushrooms. Limit these Grains: Prepackaged pasta or rice dishes. Prepackaged cereal with added sugar. Vegetables: Deep fried potatoes (french fries). Fruits: Fruit canned in syrup. Meats and other protein foods: Beef. Pork. Lamb. Poultry with skin. Hot dogs. Berniece Salines. Dairy: Ice cream. Sour cream. Whole milk. Beverages: Juice. Sugar-sweetened soft drinks. Beer. Liquor and spirits. Fats and oils: Butter. Canola oil. Vegetable oil. Beef fat (tallow). Lard. Sweets and desserts: Cookies. Cakes. Pies.  Candy. Seasoning and other foods: Mayonnaise. Premade sauces and marinades. The items listed may not be a complete list. Talk with your dietitian about what dietary choices are right for you. Summary The Mediterranean diet includes both food and lifestyle choices. Eat a variety of fresh fruits and vegetables, beans, nuts, seeds, and whole grains. Limit the amount of red meat and sweets that you eat. Talk with your health care provider about whether it is safe for you to drink red wine in moderation. This means 1 glass a day for nonpregnant women and 2 glasses a day for men. A glass of wine equals 5 oz (150 mL). This information is not intended to replace advice given to you by your health care provider. Make sure you discuss any questions you have with your health care provider. Document Released: 01/12/2016 Document Revised: 02/14/2016 Document Reviewed: 01/12/2016 Elsevier Interactive Patient Education  [employer].     Why follow it? Research shows. . Those who follow the Mediterranean diet have a reduced risk of heart disease  . The diet is associated with a reduced incidence of Parkinson's and Alzheimer's diseases . People following the diet may have longer life expectancies and lower rates of chronic diseases  . The Dietary Guidelines for Americans recommends the Mediterranean diet as an eating plan to promote health and prevent disease  What Is the Mediterranean Diet?  . Healthy eating plan based on typical foods and recipes of Mediterranean-style cooking . The diet is primarily a plant based diet; these foods should make  up a majority of meals   Starches - Plant based foods should make up a majority of meals - They are an important sources of vitamins, minerals, energy, antioxidants, and fiber - Choose whole grains, foods high in fiber and minimally processed items  - Typical grain sources include wheat, oats, barley, corn, brown rice, bulgar, farro, millet, polenta, couscous   - Various types of beans include chickpeas, lentils, fava beans, black beans, white beans   Fruits  Veggies - Large quantities of antioxidant rich fruits & veggies; 6 or more servings  - Vegetables can be eaten raw or lightly drizzled with oil and cooked  - Vegetables common to the traditional Mediterranean Diet include: artichokes, arugula, beets, broccoli, brussel sprouts, cabbage, carrots, celery, collard greens, cucumbers, eggplant, kale, leeks, lemons, lettuce, mushrooms, okra, onions, peas, peppers, potatoes, pumpkin, radishes, rutabaga, shallots, spinach, sweet potatoes, turnips, zucchini - Fruits common to the Mediterranean Diet include: apples, apricots, avocados, cherries, clementines, dates, figs, grapefruits, grapes, melons, nectarines, oranges, peaches, pears, pomegranates, strawberries, tangerines  Fats - Replace butter and margarine with healthy oils, such as olive oil, canola oil, and tahini  - Limit nuts to no more than a handful a day  - Nuts include walnuts, almonds, pecans, pistachios, pine nuts  - Limit or avoid candied, honey roasted or heavily salted nuts - Olives are central to the [employer] - can be eaten whole or used in a variety of dishes   Meats Protein - Limiting red meat: no more than a few times a month - When eating red meat: choose lean cuts and keep the portion to the size of deck of cards - Eggs: approx. 0 to 4 times a week  - Fish and lean poultry: at least 2 a week  - Healthy protein sources include, chicken, [place], lean beef, lamb - Increase intake of seafood such as tuna, salmon, trout, mackerel, shrimp, scallops - Avoid or limit high fat processed meats such as sausage and bacon  Dairy - Include moderate amounts of low fat dairy products  - Focus on healthy dairy such as fat free yogurt, skim milk, low or reduced fat cheese - Limit dairy products higher in fat such as whole or 2% milk, cheese, ice cream  Alcohol - Moderate amounts of red wine  is ok  - No more than 5 oz daily for women (all ages) and men older than age 13  - No more than 10 oz of wine daily for men younger than 71  Other - Limit sweets and other desserts  - Use herbs and spices instead of salt to flavor foods  - Herbs and spices common to the traditional Mediterranean Diet include: basil, bay leaves, chives, cloves, cumin, fennel, garlic, lavender, marjoram, mint, oregano, parsley, pepper, rosemary, sage, savory, sumac, tarragon, thyme   It's not just a diet, it's a lifestyle:  . The Mediterranean diet includes lifestyle factors typical of those in the region  . Foods, drinks and meals are best eaten with others and savored . Daily physical activity is important for overall good health . This could be strenuous exercise like running and aerobics . This could also be more leisurely activities such as walking, housework, yard-work, or taking the stairs . Moderation is the key; a balanced and healthy diet accommodates most foods and drinks . Consider portion sizes and frequency of consumption of certain foods   Meal Ideas & Options:  . Breakfast:  o Whole wheat toast or whole wheat English muffins  with peanut butter & hard boiled egg o Steel cut oats topped with apples & cinnamon and skim milk  o Fresh fruit: banana, strawberries, melon, berries, peaches  o Smoothies: strawberries, bananas, greek yogurt, peanut butter o Low fat greek yogurt with blueberries and granola  o Egg white omelet with spinach and mushrooms o Breakfast couscous: whole wheat couscous, apricots, skim milk, cranberries  . Sandwiches:  o Hummus and grilled vegetables (peppers, zucchini, squash) on whole wheat bread   o Grilled chicken on whole wheat pita with lettuce, tomatoes, cucumbers or tzatziki  o Tuna salad on whole wheat bread: tuna salad made with greek yogurt, olives, red peppers, capers, green onions o Garlic rosemary lamb pita: lamb sauted with garlic, rosemary, salt & pepper; add  lettuce, cucumber, greek yogurt to pita - flavor with lemon juice and black pepper  . Seafood:  o Mediterranean grilled salmon, seasoned with garlic, basil, parsley, lemon juice and black pepper o Shrimp, lemon, and spinach whole-grain pasta salad made with low fat greek yogurt  o Seared scallops with lemon orzo  o Seared tuna steaks seasoned salt, pepper, coriander topped with tomato mixture of olives, tomatoes, olive oil, minced garlic, parsley, green onions and cappers  . Meats:  o Herbed greek chicken salad with kalamata olives, cucumber, feta  o Red bell peppers stuffed with spinach, bulgur, lean ground beef (or lentils) & topped with feta   o Kebabs: skewers of chicken, tomatoes, onions, zucchini, squash  o [place] burgers: made with red onions, mint, dill, lemon juice, feta cheese topped with roasted red peppers . Vegetarian o Cucumber salad: cucumbers, artichoke hearts, celery, red onion, feta cheese, tossed in olive oil & lemon juice  o Hummus and whole grain pita points with a greek salad (lettuce, tomato, feta, olives, cucumbers, red onion) o Lentil soup with celery, carrots made with vegetable broth, garlic, salt and pepper  o Tabouli salad: parsley, bulgur, mint, scallions, cucumbers, tomato, radishes, lemon juice, olive oil, salt and pepper.

## 2017-07-11 NOTE — Progress Notes (Signed)
Subjective:  By signing my name below, I, [name], attest that this documentation has been prepared under the direction and in the presence of Delman Cheadle, MD Electronically Signed: Ladene Artist, ED Scribe 07/11/2017 at 9:05 AM.   Patient ID: [name], female    DOB: 1935/04/10, 83 y.o.   MRN: [id number]  Chief Complaint  Patient presents with  . Fasting Labs  . Hypertension  . Follow-up   HPI [name] is a 83 y.o. female who presents to Primary Care at [facility] for f/u. Pt is fasting at this visit.  1. Breast cancer - left lower outer, estrogen receptor positivefound during her AWV last yr. Mammogram 2 mos ago was normal. 2. HTN - lisinopril caused nocturnal cough so changed to losartan -increased to100 mg 06/2016. Checks BP at home 120-140s/70s with occ spikes.Baseline eGFR 56-64. 4 months prior Losartan DCd. Reccommended starting candesartan. Pt refused. Continued monitoring BP at home.  3. Osteoporosis - DEXA 09/2014 showed osteoporosis with T score of -5.1 at lumbar and -2.5 at hip, being managed by pt's oncologist Dr. [name] being treated by prolia inj every 6 mos. Repeat dexa in 2 years (after Dec 2018) to see if need to cont. She was instructed to start chewing  occ tums for calcium which she does sometimes but does a calcium 500/magnesium/zinc mixture once a day. Vit D level was normal at 33 18 mos prior - taking 5000u/d and her leg stiffness resolved. She is also taking magnesium sometimes which produces loose bm. Takes Vit D and calcium once/day, occassionally twice. Eats greens most days and drinks almond milk. Takes baby asa when she feels tired. 4. HPL - Lipids 4 mos piror LDL 172 ->200 ->189 with non-hdl of 197 ->227 ->214.  Pt refuses statin ("no way I would every consider it") despite my strong recomendation so taking fish oil and states trying to follow a Mediterranean diet in addition to a "bergomot fruit" supplement from [place]. High-sensitivity  C-reactive protein 07/03/15 was 1.71 show patient was at average risk.  Also doing fruit juice with berries and almond milk, kale/spinach with smoothies or a juicer with carrots and celery.  5. Pre-DM -walks 45 minutes 2-3 days/wk for exercise.   Immunization History  Administered Date(s) Administered  . Hepatitis B 07/15/2004, 08/19/2004, 01/12/2005  . Pneumococcal Conjugate-13 11/05/2016  . Td 07/15/2004, 09/09/2014  Influenza: declines  Past Medical History:  Diagnosis Date  . Anxiety   . Breast cancer ([place])   . Breast cancer of lower-outer quadrant of left female breast ([place]) 10/11/2014  . GERD (gastroesophageal reflux disease)   . Hypertension   . Personal history of radiation therapy    2016  . PONV (postoperative nausea and vomiting)   . PUD (peptic ulcer disease)   . Stone, kidney    Past Surgical History:  Procedure Laterality Date  . BREAST LUMPECTOMY WITH SENTINEL LYMPH NODE BIOPSY Left 10/22/2014   Procedure: BREAST LUMPECTOMY WITH SENTINEL LYMPH NODE BIOPSY;  Surgeon: Autumn Messing III, MD;  Location: [facility];  Service: General;  Laterality: Left;  . RE-EXCISION OF BREAST CANCER,SUPERIOR MARGINS Left 11/04/2014   Procedure: RE-EXCISION OF LEFT BREAST INFERIOR MARGINS;  Surgeon: Autumn Messing III, MD;  Location: [facility];  Service: General;  Laterality: Left;   Current Outpatient Medications on File Prior to Visit  Medication Sig Dispense Refill  . anastrozole (ARIMIDEX) 1 MG tablet TAKE ONE TABLET BY MOUTH ONCE DAILY 90 tablet 4  . Ascorbic Acid (  VITAMIN C) 1000 MG tablet Take 1,000 mg by mouth daily.    Marland Kitchen aspirin 81 MG tablet Take 81 mg by mouth 3 (three) times a week.     . Calcium Carb-Cholecalciferol (CALCIUM + D3) 600-800 MG-UNIT TABS Take by mouth.    . cholecalciferol (VITAMIN D) 1000 UNITS tablet Take 1,000 Units by mouth daily. Over the counter    . co-enzyme Q-10 30 MG capsule Take 30 mg by mouth 3 (three) times daily.    . fish oil-omega-3 fatty acids  1000 MG capsule Take 1 g by mouth daily.    Marland Kitchen GARLIC PO Take by mouth daily.    . magnesium gluconate (MAGONATE) 500 MG tablet Take 500 mg by mouth 2 (two) times daily.    . Multiple Vitamin (MULTIVITAMIN) tablet Take 1 tablet by mouth daily.     No current facility-administered medications on file prior to visit.    Allergies  Allergen Reactions  . Lisinopril     D/c lisinopril due to cough   Family History  Problem Relation Age of Onset  . Pancreatic cancer Mother        pancreastic  . Stroke Father   . Hypertension Brother   . Hypertension Sister   . Hypertension Brother   . Heart disease Brother   . Hypertension Sister    Social History   Socioeconomic History  . Marital status: Married    Spouse name: None  . Number of children: None  . Years of education: None  . Highest education level: None  Social Needs  . Financial resource strain: None  . Food insecurity - worry: None  . Food insecurity - inability: None  . Transportation needs - medical: None  . Transportation needs - non-medical: None  Occupational History  . Occupation: retired  Tobacco Use  . Smoking status: Never Smoker  . Smokeless tobacco: Never Used  Substance and Sexual Activity  . Alcohol use: No  . Drug use: No  . Sexual activity: None  Other Topics Concern  . None  Social History Narrative  . None   Depression screen [facility] 2/9 07/11/2017 02/28/2017 11/05/2016 07/02/2016 11/17/2015  Decreased Interest 0 0 0 0 0  Down, Depressed, Hopeless 0 0 0 0 0  PHQ - 2 Score 0 0 0 0 0    Review of Systems  Constitutional: Positive for fatigue. Negative for appetite change, chills, diaphoresis and fever.  Eyes: Negative for visual disturbance.  Respiratory: Negative for cough and shortness of breath.   Cardiovascular: Negative for chest pain, palpitations and leg swelling.  Genitourinary: Negative for decreased urine volume.  Neurological: Negative for syncope and headaches.  Hematological: Does not  bruise/bleed easily.      Objective:   Physical Exam  Constitutional: She is oriented to person, place, and time. She appears well-developed and well-nourished. No distress.  HENT:  Head: Normocephalic and atraumatic.  Eyes: Conjunctivae and EOM are normal.  Neck: Neck supple. No tracheal deviation present.  Cardiovascular: Normal rate.  Pulmonary/Chest: Effort normal. No respiratory distress.  Musculoskeletal: Normal range of motion.  Neurological: She is alert and oriented to person, place, and time.  Skin: Skin is warm and dry.  Psychiatric: She has a normal mood and affect. Her behavior is normal.  Nursing note and vitals reviewed.  BP 140/90 (BP Location: Right Arm, Patient Position: Sitting, Cuff Size: Normal)   Pulse 74   Temp 98 F (36.7 C) (Oral)   Resp 18   Ht 5'  7.72" (1.72 m)   Wt 177 lb 12.8 oz (80.6 kg)   SpO2 100%   BMI 27.26 kg/m     Assessment & Plan:   1. HTN (hypertension), benign   2. Malignant neoplasm of lower-outer quadrant of left breast of female, estrogen receptor positive ([place])   3. PUD (peptic ulcer disease)   4. Other osteoporosis without current pathological fracture   5. Pure hypercholesterolemia   6. Prediabetes     Orders Placed This Encounter  Procedures  . Comprehensive metabolic panel    Order Specific Question:   Has the patient fasted?    Answer:   Yes  . Lipid panel    Order Specific Question:   Has the patient fasted?    Answer:   Yes  . Hemoglobin A1c   Meds ordered this encounter  Medications  . losartan (COZAAR) 100 MG tablet    Sig: Take 1 tablet (100 mg total) by mouth daily.    Dispense:  90 tablet    Refill:  3     I personally performed the services described in this documentation, which was scribed in my presence. The recorded information has been reviewed and considered, and addended by me as needed.   Delman Cheadle, M.D.  Primary Care at [facility] [address] [place], [place] [address] [phone] phone [phone] fax  07/13/17 3:46 PM

## 2017-07-12 LAB — COMPREHENSIVE METABOLIC PANEL
ALK PHOS: 43 IU/L (ref 39–117)
ALT: 16 IU/L (ref 0–32)
AST: 18 IU/L (ref 0–40)
Albumin/Globulin Ratio: 1.5 (ref 1.2–2.2)
Albumin: 4.1 g/dL (ref 3.5–4.7)
BILIRUBIN TOTAL: 0.4 mg/dL (ref 0.0–1.2)
BUN/Creatinine Ratio: 17 (ref 12–28)
BUN: 17 mg/dL (ref 8–27)
CHLORIDE: 107 mmol/L — AB (ref 96–106)
CO2: 22 mmol/L (ref 20–29)
Calcium: 9.9 mg/dL (ref 8.7–10.3)
Creatinine, Ser: 0.99 mg/dL (ref 0.57–1.00)
GFR calc Af Amer: 62 mL/min/{1.73_m2} (ref >59)
GFR calc non Af Amer: 54 mL/min/{1.73_m2} — ABNORMAL LOW (ref >59)
GLUCOSE: 95 mg/dL (ref 65–99)
Globulin, Total: 2.7 g/dL (ref 1.5–4.5)
Potassium: 4.6 mmol/L (ref 3.5–5.2)
Sodium: 142 mmol/L (ref 134–144)
TOTAL PROTEIN: 6.8 g/dL (ref 6.0–8.5)

## 2017-07-12 LAB — LIPID PANEL
Chol/HDL Ratio: 4 ratio (ref 0.0–4.4)
Cholesterol, Total: 240 mg/dL — ABNORMAL HIGH (ref 100–199)
HDL: 60 mg/dL (ref >39)
LDL Calculated: 162 mg/dL — ABNORMAL HIGH (ref 0–99)
TRIGLYCERIDES: 92 mg/dL (ref 0–149)
VLDL CHOLESTEROL CAL: 18 mg/dL (ref 5–40)

## 2017-07-12 LAB — HEMOGLOBIN A1C
Est. average glucose Bld gHb Est-mCnc: 123 mg/dL
Hgb A1c MFr Bld: 5.9 % — ABNORMAL HIGH (ref 4.8–5.6)

## 2017-07-13 MED ORDER — LOSARTAN POTASSIUM 100 MG PO TABS: 100.0000 mg | ORAL_TABLET | Freq: Every day | ORAL | 3 refills | Status: DC

## 2017-07-25 DIAGNOSIS — I110 Hypertensive heart disease with heart failure: Secondary | ICD-10-CM | POA: Diagnosis not present

## 2017-07-25 DIAGNOSIS — I4891 Unspecified atrial fibrillation: Secondary | ICD-10-CM | POA: Diagnosis not present

## 2017-07-25 DIAGNOSIS — Z9911 Dependence on respirator [ventilator] status: Secondary | ICD-10-CM | POA: Diagnosis not present

## 2017-07-25 DIAGNOSIS — I501 Left ventricular failure: Secondary | ICD-10-CM | POA: Diagnosis not present

## 2017-07-27 ENCOUNTER — Other Ambulatory Visit: Payer: Self-pay

## 2017-07-27 ENCOUNTER — Inpatient Hospital Stay (HOSPITAL_COMMUNITY)
Admission: EM | Admit: 2017-07-27 | Discharge: 2017-08-07 | DRG: 246 | Disposition: A | Discharge status: Home Health | Payer: Medicare Other | Attending: Cardiology | Admitting: Cardiology

## 2017-07-27 ENCOUNTER — Inpatient Hospital Stay (HOSPITAL_COMMUNITY)
Admission: EM | Disposition: A | Discharge status: Home Health | Payer: Self-pay | Source: Home / Self Care | Attending: Cardiology

## 2017-07-27 ENCOUNTER — Inpatient Hospital Stay (HOSPITAL_COMMUNITY): Payer: Medicare Other | Admitting: Anesthesiology

## 2017-07-27 ENCOUNTER — Inpatient Hospital Stay (HOSPITAL_COMMUNITY): Payer: Medicare Other

## 2017-07-27 DIAGNOSIS — M810 Age-related osteoporosis without current pathological fracture: Secondary | ICD-10-CM | POA: Diagnosis present

## 2017-07-27 DIAGNOSIS — I2109 ST elevation (STEMI) myocardial infarction involving other coronary artery of anterior wall: Secondary | ICD-10-CM | POA: Diagnosis not present

## 2017-07-27 DIAGNOSIS — I4891 Unspecified atrial fibrillation: Secondary | ICD-10-CM | POA: Diagnosis not present

## 2017-07-27 DIAGNOSIS — E7800 Pure hypercholesterolemia, unspecified: Secondary | ICD-10-CM | POA: Diagnosis present

## 2017-07-27 DIAGNOSIS — N390 Urinary tract infection, site not specified: Secondary | ICD-10-CM | POA: Diagnosis not present

## 2017-07-27 DIAGNOSIS — K219 Gastro-esophageal reflux disease without esophagitis: Secondary | ICD-10-CM | POA: Diagnosis present

## 2017-07-27 DIAGNOSIS — R319 Hematuria, unspecified: Secondary | ICD-10-CM | POA: Diagnosis not present

## 2017-07-27 DIAGNOSIS — N179 Acute kidney failure, unspecified: Secondary | ICD-10-CM | POA: Diagnosis not present

## 2017-07-27 DIAGNOSIS — I219 Acute myocardial infarction, unspecified: Secondary | ICD-10-CM

## 2017-07-27 DIAGNOSIS — Z955 Presence of coronary angioplasty implant and graft: Secondary | ICD-10-CM

## 2017-07-27 DIAGNOSIS — I255 Ischemic cardiomyopathy: Secondary | ICD-10-CM | POA: Diagnosis not present

## 2017-07-27 DIAGNOSIS — I472 Ventricular tachycardia: Secondary | ICD-10-CM | POA: Diagnosis not present

## 2017-07-27 DIAGNOSIS — F329 Major depressive disorder, single episode, unspecified: Secondary | ICD-10-CM | POA: Diagnosis present

## 2017-07-27 DIAGNOSIS — I361 Nonrheumatic tricuspid (valve) insufficiency: Secondary | ICD-10-CM | POA: Diagnosis not present

## 2017-07-27 DIAGNOSIS — Z4682 Encounter for fitting and adjustment of non-vascular catheter: Secondary | ICD-10-CM | POA: Diagnosis not present

## 2017-07-27 DIAGNOSIS — I5023 Acute on chronic systolic (congestive) heart failure: Secondary | ICD-10-CM | POA: Diagnosis not present

## 2017-07-27 DIAGNOSIS — Z79899 Other long term (current) drug therapy: Secondary | ICD-10-CM

## 2017-07-27 DIAGNOSIS — I2510 Atherosclerotic heart disease of native coronary artery without angina pectoris: Secondary | ICD-10-CM | POA: Diagnosis not present

## 2017-07-27 DIAGNOSIS — Z0189 Encounter for other specified special examinations: Secondary | ICD-10-CM

## 2017-07-27 DIAGNOSIS — E785 Hyperlipidemia, unspecified: Secondary | ICD-10-CM | POA: Diagnosis present

## 2017-07-27 DIAGNOSIS — I10 Essential (primary) hypertension: Secondary | ICD-10-CM | POA: Diagnosis present

## 2017-07-27 DIAGNOSIS — J90 Pleural effusion, not elsewhere classified: Secondary | ICD-10-CM | POA: Diagnosis not present

## 2017-07-27 DIAGNOSIS — E876 Hypokalemia: Secondary | ICD-10-CM | POA: Diagnosis not present

## 2017-07-27 DIAGNOSIS — J9601 Acute respiratory failure with hypoxia: Secondary | ICD-10-CM | POA: Diagnosis present

## 2017-07-27 DIAGNOSIS — I110 Hypertensive heart disease with heart failure: Secondary | ICD-10-CM | POA: Diagnosis present

## 2017-07-27 DIAGNOSIS — R079 Chest pain, unspecified: Secondary | ICD-10-CM | POA: Diagnosis not present

## 2017-07-27 DIAGNOSIS — R7303 Prediabetes: Secondary | ICD-10-CM | POA: Diagnosis present

## 2017-07-27 DIAGNOSIS — Z7982 Long term (current) use of aspirin: Secondary | ICD-10-CM

## 2017-07-27 DIAGNOSIS — I509 Heart failure, unspecified: Secondary | ICD-10-CM | POA: Diagnosis not present

## 2017-07-27 DIAGNOSIS — I5021 Acute systolic (congestive) heart failure: Secondary | ICD-10-CM | POA: Diagnosis not present

## 2017-07-27 DIAGNOSIS — Z853 Personal history of malignant neoplasm of breast: Secondary | ICD-10-CM

## 2017-07-27 DIAGNOSIS — J811 Chronic pulmonary edema: Secondary | ICD-10-CM | POA: Diagnosis not present

## 2017-07-27 DIAGNOSIS — J9690 Respiratory failure, unspecified, unspecified whether with hypoxia or hypercapnia: Secondary | ICD-10-CM

## 2017-07-27 DIAGNOSIS — I480 Paroxysmal atrial fibrillation: Secondary | ICD-10-CM | POA: Diagnosis not present

## 2017-07-27 DIAGNOSIS — R570 Cardiogenic shock: Secondary | ICD-10-CM | POA: Diagnosis present

## 2017-07-27 DIAGNOSIS — Z79811 Long term (current) use of aromatase inhibitors: Secondary | ICD-10-CM | POA: Diagnosis not present

## 2017-07-27 DIAGNOSIS — Z923 Personal history of irradiation: Secondary | ICD-10-CM

## 2017-07-27 DIAGNOSIS — R918 Other nonspecific abnormal finding of lung field: Secondary | ICD-10-CM | POA: Diagnosis not present

## 2017-07-27 DIAGNOSIS — R0602 Shortness of breath: Secondary | ICD-10-CM | POA: Diagnosis not present

## 2017-07-27 DIAGNOSIS — L8990 Pressure ulcer of unspecified site, unspecified stage: Secondary | ICD-10-CM

## 2017-07-27 DIAGNOSIS — I469 Cardiac arrest, cause unspecified: Secondary | ICD-10-CM | POA: Diagnosis not present

## 2017-07-27 DIAGNOSIS — J810 Acute pulmonary edema: Secondary | ICD-10-CM | POA: Diagnosis not present

## 2017-07-27 DIAGNOSIS — Z9911 Dependence on respirator [ventilator] status: Secondary | ICD-10-CM

## 2017-07-27 DIAGNOSIS — J9600 Acute respiratory failure, unspecified whether with hypoxia or hypercapnia: Secondary | ICD-10-CM | POA: Diagnosis not present

## 2017-07-27 DIAGNOSIS — Z452 Encounter for adjustment and management of vascular access device: Secondary | ICD-10-CM | POA: Diagnosis not present

## 2017-07-27 DIAGNOSIS — R0989 Other specified symptoms and signs involving the circulatory and respiratory systems: Secondary | ICD-10-CM

## 2017-07-27 HISTORY — DX: Acute myocardial infarction, unspecified: I21.9

## 2017-07-27 HISTORY — DX: Atherosclerotic heart disease of native coronary artery without angina pectoris: I25.10

## 2017-07-27 HISTORY — PX: CORONARY STENT INTERVENTION: CATH118234

## 2017-07-27 HISTORY — PX: CORONARY/GRAFT ACUTE MI REVASCULARIZATION: CATH118305

## 2017-07-27 HISTORY — PX: LEFT HEART CATH AND CORONARY ANGIOGRAPHY: CATH118249

## 2017-07-27 LAB — CBC WITH DIFFERENTIAL/PLATELET
BASOS ABS: 0 10*3/uL (ref 0.0–0.1)
Basophils Relative: 0 %
EOS PCT: 3 %
Eosinophils Absolute: 0.2 10*3/uL (ref 0.0–0.7)
HEMATOCRIT: 34.6 % — AB (ref 36.0–46.0)
Hemoglobin: 11.1 g/dL — ABNORMAL LOW (ref 12.0–15.0)
Lymphocytes Relative: 32 %
Lymphs Abs: 2.8 10*3/uL (ref 0.7–4.0)
MCH: 29.7 pg (ref 26.0–34.0)
MCHC: 32.1 g/dL (ref 30.0–36.0)
MCV: 92.5 fL (ref 78.0–100.0)
Monocytes Absolute: 0.6 10*3/uL (ref 0.1–1.0)
Monocytes Relative: 7 %
NEUTROS ABS: 5.1 10*3/uL (ref 1.7–7.7)
NEUTROS PCT: 58 %
PLATELETS: 251 10*3/uL (ref 150–400)
RBC: 3.74 MIL/uL — AB (ref 3.87–5.11)
RDW: 13.2 % (ref 11.5–15.5)
WBC: 8.7 10*3/uL (ref 4.0–10.5)

## 2017-07-27 LAB — POCT I-STAT, CHEM 8
BUN: 27 mg/dL — ABNORMAL HIGH (ref 6–20)
BUN: 28 mg/dL — ABNORMAL HIGH (ref 6–20)
CALCIUM ION: 1.26 mmol/L (ref 1.15–1.40)
CHLORIDE: 106 mmol/L (ref 101–111)
CREATININE: 1.3 mg/dL — AB (ref 0.44–1.00)
Calcium, Ion: 1.33 mmol/L (ref 1.15–1.40)
Chloride: 109 mmol/L (ref 101–111)
Creatinine, Ser: 1.2 mg/dL — ABNORMAL HIGH (ref 0.44–1.00)
GLUCOSE: 132 mg/dL — AB (ref 65–99)
Glucose, Bld: 230 mg/dL — ABNORMAL HIGH (ref 65–99)
HCT: 35 % — ABNORMAL LOW (ref 36.0–46.0)
HEMATOCRIT: 44 % (ref 36.0–46.0)
HEMOGLOBIN: 11.9 g/dL — AB (ref 12.0–15.0)
Hemoglobin: 15 g/dL (ref 12.0–15.0)
POTASSIUM: 3.4 mmol/L — AB (ref 3.5–5.1)
Potassium: 3.9 mmol/L (ref 3.5–5.1)
SODIUM: 142 mmol/L (ref 135–145)
Sodium: 141 mmol/L (ref 135–145)
TCO2: 22 mmol/L (ref 22–32)
TCO2: 24 mmol/L (ref 22–32)

## 2017-07-27 LAB — COMPREHENSIVE METABOLIC PANEL
ALK PHOS: 43 U/L (ref 38–126)
ALT: 19 U/L (ref 14–54)
ALT: 222 U/L — AB (ref 14–54)
AST: 29 U/L (ref 15–41)
AST: 431 U/L — AB (ref 15–41)
Albumin: 3.7 g/dL (ref 3.5–5.0)
Albumin: 3.5 g/dL (ref 3.5–5.0)
Alkaline Phosphatase: 38 U/L (ref 38–126)
Anion gap: 20 — ABNORMAL HIGH (ref 5–15)
Anion gap: 12 (ref 5–15)
BUN: 27 mg/dL — ABNORMAL HIGH (ref 6–20)
BUN: 27 mg/dL — AB (ref 6–20)
CALCIUM: 9.3 mg/dL (ref 8.9–10.3)
CHLORIDE: 102 mmol/L (ref 101–111)
CHLORIDE: 107 mmol/L (ref 101–111)
CO2: 19 mmol/L — AB (ref 22–32)
CO2: 20 mmol/L — AB (ref 22–32)
CREATININE: 1.28 mg/dL — AB (ref 0.44–1.00)
CREATININE: 1.27 mg/dL — AB (ref 0.44–1.00)
Calcium: 9.6 mg/dL (ref 8.9–10.3)
GFR calc Af Amer: 45 mL/min — ABNORMAL LOW (ref >60)
GFR calc non Af Amer: 38 mL/min — ABNORMAL LOW (ref >60)
GFR, EST AFRICAN AMERICAN: 45 mL/min — AB (ref >60)
GFR, EST NON AFRICAN AMERICAN: 39 mL/min — AB (ref >60)
Glucose, Bld: 132 mg/dL — ABNORMAL HIGH (ref 65–99)
Glucose, Bld: 229 mg/dL — ABNORMAL HIGH (ref 65–99)
Potassium: 3.5 mmol/L (ref 3.5–5.1)
Potassium: 3.4 mmol/L — ABNORMAL LOW (ref 3.5–5.1)
SODIUM: 141 mmol/L (ref 135–145)
SODIUM: 139 mmol/L (ref 135–145)
Total Bilirubin: 0.7 mg/dL (ref 0.3–1.2)
Total Bilirubin: 0.6 mg/dL (ref 0.3–1.2)
Total Protein: 6.5 g/dL (ref 6.5–8.1)
Total Protein: 6.3 g/dL — ABNORMAL LOW (ref 6.5–8.1)

## 2017-07-27 LAB — POCT I-STAT 3, ART BLOOD GAS (G3+)
ACID-BASE DEFICIT: 6 mmol/L — AB (ref 0.0–2.0)
Acid-base deficit: 6 mmol/L — ABNORMAL HIGH (ref 0.0–2.0)
BICARBONATE: 22.3 mmol/L (ref 20.0–28.0)
BICARBONATE: 19.9 mmol/L — AB (ref 20.0–28.0)
O2 SAT: 97 %
O2 Saturation: 98 %
PCO2 ART: 42 mmHg (ref 32.0–48.0)
PH ART: 7.285 — AB (ref 7.350–7.450)
PO2 ART: 107 mmHg (ref 83.0–108.0)
PO2 ART: 108 mmHg (ref 83.0–108.0)
TCO2: 24 mmol/L (ref 22–32)
TCO2: 21 mmol/L — ABNORMAL LOW (ref 22–32)
pCO2 arterial: 56.7 mmHg — ABNORMAL HIGH (ref 32.0–48.0)
pH, Arterial: 7.203 — ABNORMAL LOW (ref 7.350–7.450)

## 2017-07-27 LAB — CBC
HCT: 38.6 % (ref 36.0–46.0)
Hemoglobin: 12.4 g/dL (ref 12.0–15.0)
MCH: 30 pg (ref 26.0–34.0)
MCHC: 32.1 g/dL (ref 30.0–36.0)
MCV: 93.5 fL (ref 78.0–100.0)
PLATELETS: 267 10*3/uL (ref 150–400)
RBC: 4.13 MIL/uL (ref 3.87–5.11)
RDW: 13.3 % (ref 11.5–15.5)
WBC: 19.9 10*3/uL — ABNORMAL HIGH (ref 4.0–10.5)

## 2017-07-27 LAB — APTT: APTT: 29 s (ref 24–36)

## 2017-07-27 LAB — PROTIME-INR
INR: 1.04
Prothrombin Time: 13.5 seconds (ref 11.4–15.2)

## 2017-07-27 LAB — GLUCOSE, CAPILLARY: GLUCOSE-CAPILLARY: 145 mg/dL — AB (ref 65–99)

## 2017-07-27 LAB — MRSA PCR SCREENING: MRSA by PCR: NEGATIVE

## 2017-07-27 LAB — COOXEMETRY PANEL
Carboxyhemoglobin: 0.4 % — ABNORMAL LOW (ref 0.5–1.5)
Methemoglobin: 1.3 % (ref 0.0–1.5)
O2 Saturation: 59.1 %
TOTAL HEMOGLOBIN: 12.2 g/dL (ref 12.0–16.0)

## 2017-07-27 LAB — MAGNESIUM: MAGNESIUM: 2.1 mg/dL (ref 1.7–2.4)

## 2017-07-27 LAB — LIPID PANEL
CHOL/HDL RATIO: 4.6 ratio
CHOLESTEROL: 254 mg/dL — AB (ref 0–200)
HDL: 55 mg/dL (ref >40)
LDL Cholesterol: 158 mg/dL — ABNORMAL HIGH (ref 0–99)
TRIGLYCERIDES: 203 mg/dL — AB (ref <150)
VLDL: 41 mg/dL — AB (ref 0–40)

## 2017-07-27 LAB — POCT I-STAT TROPONIN I: Troponin i, poc: 0.03 ng/mL (ref 0.00–0.08)

## 2017-07-27 LAB — TSH: TSH: 1.936 u[IU]/mL (ref 0.350–4.500)

## 2017-07-27 LAB — TROPONIN I
Troponin I: 0.03 ng/mL (ref <0.03)
Troponin I: 56.25 ng/mL (ref <0.03)

## 2017-07-27 LAB — LACTIC ACID, PLASMA: LACTIC ACID, VENOUS: 1.4 mmol/L (ref 0.5–1.9)

## 2017-07-27 SURGERY — CORONARY/GRAFT ACUTE MI REVASCULARIZATION
Anesthesia: LOCAL

## 2017-07-27 MED ORDER — HEPARIN (PORCINE) IN NACL 2-0.9 UNIT/ML-% IJ SOLN
INTRAMUSCULAR | Status: AC | PRN
  Administered 2017-07-27 (×2): 500 mL

## 2017-07-27 MED ORDER — TICAGRELOR 90 MG PO TABS
90.0000 mg | ORAL_TABLET | Freq: Two times a day (BID) | ORAL | Status: DC
Start: 2017-07-28 — End: 2017-07-29
  Administered 2017-07-28 – 2017-07-29 (×3): 90 mg
  Filled 2017-07-27 (×5): qty 1

## 2017-07-27 MED ORDER — POTASSIUM CHLORIDE 20 MEQ/15ML (10%) PO SOLN: 20.0000 meq | Freq: Once | ORAL | Status: DC

## 2017-07-27 MED ORDER — ADULT MULTIVITAMIN W/MINERALS CH: 1.0000 | ORAL_TABLET | Freq: Every day | ORAL | Status: DC

## 2017-07-27 MED ORDER — TICAGRELOR 90 MG PO TABS: 90.0000 mg | ORAL_TABLET | Freq: Two times a day (BID) | ORAL | Status: DC

## 2017-07-27 MED ORDER — ASPIRIN 81 MG PO CHEW: 81.0000 mg | CHEWABLE_TABLET | Freq: Every day | ORAL | Status: DC

## 2017-07-27 MED ORDER — FENTANYL 2500MCG IN NS 250ML (10MCG/ML) PREMIX INFUSION
0.0000 ug/h | INTRAVENOUS | Status: DC
Start: 2017-07-27 — End: 2017-07-28
  Administered 2017-07-27 – 2017-07-28 (×2): 100 ug/h via INTRAVENOUS
  Filled 2017-07-27 (×2): qty 250

## 2017-07-27 MED ORDER — TICAGRELOR 90 MG PO TABS
ORAL_TABLET | ORAL | Status: DC | PRN
  Administered 2017-07-27: 180 mg via ORAL

## 2017-07-27 MED ORDER — ACETAMINOPHEN 325 MG PO TABS: 650.0000 mg | ORAL_TABLET | ORAL | Status: DC | PRN

## 2017-07-27 MED ORDER — NITROGLYCERIN 1 MG/10 ML FOR IR/CATH LAB
INTRA_ARTERIAL | Status: DC | PRN
Start: 2017-07-27 — End: 2017-07-27
  Administered 2017-07-27: 200 ug via INTRACORONARY

## 2017-07-27 MED ORDER — NITROGLYCERIN 0.4 MG SL SUBL: 0.4000 mg | SUBLINGUAL_TABLET | SUBLINGUAL | Status: DC | PRN

## 2017-07-27 MED ORDER — CHLORHEXIDINE GLUCONATE 0.12% ORAL RINSE (MEDLINE KIT)
15.0000 mL | Freq: Two times a day (BID) | OROMUCOSAL | Status: DC
  Administered 2017-07-27: 15 mL via OROMUCOSAL

## 2017-07-27 MED ORDER — NITROGLYCERIN IN D5W 200-5 MCG/ML-% IV SOLN
0.0000 ug/min | INTRAVENOUS | Status: DC
Start: 2017-07-27 — End: 2017-07-29
  Filled 2017-07-27: qty 250

## 2017-07-27 MED ORDER — INSULIN ASPART 100 UNIT/ML ~~LOC~~ SOLN
2.0000 [IU] | SUBCUTANEOUS | Status: DC
  Administered 2017-07-27: 6 [IU] via SUBCUTANEOUS
  Administered 2017-07-28 (×4): 2 [IU] via SUBCUTANEOUS
  Administered 2017-07-29 (×3): 4 [IU] via SUBCUTANEOUS
  Administered 2017-07-30: 2 [IU] via SUBCUTANEOUS
  Administered 2017-07-30: 4 [IU] via SUBCUTANEOUS
  Administered 2017-07-30 – 2017-07-31 (×2): 2 [IU] via SUBCUTANEOUS
  Administered 2017-07-31: 4 [IU] via SUBCUTANEOUS
  Administered 2017-07-31 – 2017-08-01 (×2): 2 [IU] via SUBCUTANEOUS
  Administered 2017-08-01: 4 [IU] via SUBCUTANEOUS
  Administered 2017-08-01 (×2): 2 [IU] via SUBCUTANEOUS
  Administered 2017-08-02: 4 [IU] via SUBCUTANEOUS
  Administered 2017-08-02 (×2): 2 [IU] via SUBCUTANEOUS
  Administered 2017-08-03 – 2017-08-04 (×2): 4 [IU] via SUBCUTANEOUS
  Administered 2017-08-04: 2 [IU] via SUBCUTANEOUS

## 2017-07-27 MED ORDER — HEPARIN SODIUM (PORCINE) 1000 UNIT/ML IJ SOLN
INTRAMUSCULAR | Status: DC | PRN
  Administered 2017-07-27: 8000 [IU] via INTRAVENOUS

## 2017-07-27 MED ORDER — ORAL CARE MOUTH RINSE
15.0000 mL | OROMUCOSAL | Status: DC
  Administered 2017-07-28 – 2017-07-29 (×10): 15 mL via OROMUCOSAL

## 2017-07-27 MED ORDER — FENTANYL CITRATE (PF) 100 MCG/2ML IJ SOLN: 100.0000 ug | Freq: Once | INTRAMUSCULAR | Status: AC

## 2017-07-27 MED ORDER — ANASTROZOLE 1 MG PO TABS
1.0000 mg | ORAL_TABLET | Freq: Every day | ORAL | Status: DC
  Administered 2017-07-28 – 2017-08-07 (×11): 1 mg via ORAL
  Filled 2017-07-27 (×11): qty 1

## 2017-07-27 MED ORDER — SODIUM CHLORIDE 0.9% FLUSH
3.0000 mL | Freq: Two times a day (BID) | INTRAVENOUS | Status: DC
  Administered 2017-07-28 – 2017-08-04 (×5): 3 mL via INTRAVENOUS

## 2017-07-27 MED ORDER — HEPARIN SODIUM (PORCINE) 5000 UNIT/ML IJ SOLN: 4000.0000 [IU] | Freq: Once | INTRAMUSCULAR | Status: DC

## 2017-07-27 MED ORDER — SODIUM CHLORIDE 0.9 % IV SOLN: 250.0000 mL | INTRAVENOUS | Status: DC | PRN

## 2017-07-27 MED ORDER — LIDOCAINE HCL (PF) 1 % IJ SOLN
INTRAMUSCULAR | Status: DC | PRN
  Administered 2017-07-27: 2 mL

## 2017-07-27 MED ORDER — ADULT MULTIVITAMIN LIQUID CH
15.0000 mL | Freq: Every day | ORAL | Status: DC
  Administered 2017-07-28: 15 mL
  Filled 2017-07-27: qty 15

## 2017-07-27 MED ORDER — SODIUM CHLORIDE 0.9% FLUSH: 3.0000 mL | INTRAVENOUS | Status: DC | PRN

## 2017-07-27 MED ORDER — SODIUM CHLORIDE 0.9 % IV SOLN
INTRAVENOUS | Status: DC
  Administered 2017-07-27: 10 mL/h via INTRAVENOUS

## 2017-07-27 MED ORDER — ORAL CARE MOUTH RINSE: 15.0000 mL | Freq: Four times a day (QID) | OROMUCOSAL | Status: DC

## 2017-07-27 MED ORDER — ASPIRIN 81 MG PO CHEW: 324.0000 mg | CHEWABLE_TABLET | Freq: Once | ORAL | Status: DC

## 2017-07-27 MED ORDER — IOPAMIDOL (ISOVUE-370) INJECTION 76%
INTRAVENOUS | Status: DC | PRN
  Administered 2017-07-27: 170 mL via INTRA_ARTERIAL

## 2017-07-27 MED ORDER — SODIUM CHLORIDE 0.9 % IV SOLN
INTRAVENOUS | Status: AC | PRN
  Administered 2017-07-27: 999 mL/h via INTRAVENOUS

## 2017-07-27 MED ORDER — SODIUM CHLORIDE 0.9 % IV SOLN
250.0000 mL | INTRAVENOUS | Status: DC | PRN
  Administered 2017-07-30: 10 mL via INTRAVENOUS

## 2017-07-27 MED ORDER — INSULIN ASPART 100 UNIT/ML ~~LOC~~ SOLN: 0.0000 [IU] | Freq: Three times a day (TID) | SUBCUTANEOUS | Status: DC

## 2017-07-27 MED ORDER — ONDANSETRON HCL 4 MG/2ML IJ SOLN: 4.0000 mg | Freq: Four times a day (QID) | INTRAMUSCULAR | Status: DC | PRN

## 2017-07-27 MED ORDER — CHLORHEXIDINE GLUCONATE 0.12% ORAL RINSE (MEDLINE KIT)
15.0000 mL | Freq: Two times a day (BID) | OROMUCOSAL | Status: DC
  Administered 2017-07-28 – 2017-07-29 (×3): 15 mL via OROMUCOSAL

## 2017-07-27 MED ORDER — OMEGA-3-ACID ETHYL ESTERS 1 G PO CAPS
1.0000 g | ORAL_CAPSULE | Freq: Every day | ORAL | Status: DC
  Administered 2017-07-28 – 2017-07-29 (×2): 1 g
  Filled 2017-07-27 (×2): qty 1

## 2017-07-27 MED ORDER — OMEGA-3-ACID ETHYL ESTERS 1 G PO CAPS: 1.0000 g | ORAL_CAPSULE | Freq: Every day | ORAL | Status: DC

## 2017-07-27 MED ORDER — VITAMIN C 500 MG PO TABS
1000.0000 mg | ORAL_TABLET | Freq: Every day | ORAL | Status: DC
  Administered 2017-07-28 – 2017-07-29 (×2): 1000 mg
  Filled 2017-07-27 (×2): qty 2

## 2017-07-27 MED ORDER — TICAGRELOR 90 MG PO TABS
ORAL_TABLET | ORAL | Status: AC
  Filled 2017-07-27: qty 2

## 2017-07-27 MED ORDER — VERAPAMIL HCL 2.5 MG/ML IV SOLN
INTRAVENOUS | Status: AC
  Filled 2017-07-27: qty 2

## 2017-07-27 MED ORDER — MIDAZOLAM HCL 2 MG/2ML IJ SOLN: 1.0000 mg | INTRAMUSCULAR | Status: DC | PRN

## 2017-07-27 MED ORDER — VERAPAMIL HCL 2.5 MG/ML IV SOLN
INTRAVENOUS | Status: DC | PRN
  Administered 2017-07-27: 10 mL via INTRA_ARTERIAL

## 2017-07-27 MED ORDER — HEPARIN (PORCINE) IN NACL 2-0.9 UNIT/ML-% IJ SOLN
INTRAMUSCULAR | Status: AC
  Filled 2017-07-27: qty 1000

## 2017-07-27 MED ORDER — VITAMIN C 500 MG PO TABS: 1000.0000 mg | ORAL_TABLET | Freq: Every day | ORAL | Status: DC

## 2017-07-27 MED ORDER — FUROSEMIDE 10 MG/ML IJ SOLN
40.0000 mg | Freq: Two times a day (BID) | INTRAMUSCULAR | Status: DC
  Administered 2017-07-27 – 2017-07-29 (×4): 40 mg via INTRAVENOUS
  Filled 2017-07-27 (×3): qty 4

## 2017-07-27 MED ORDER — FENTANYL CITRATE (PF) 100 MCG/2ML IJ SOLN
50.0000 ug | Freq: Once | INTRAMUSCULAR | Status: AC
  Administered 2017-07-27: 100 ug via INTRAVENOUS
  Filled 2017-07-27: qty 2

## 2017-07-27 MED ORDER — ENOXAPARIN SODIUM 40 MG/0.4ML ~~LOC~~ SOLN
40.0000 mg | SUBCUTANEOUS | Status: DC
  Administered 2017-07-28 – 2017-07-29 (×2): 40 mg via SUBCUTANEOUS
  Filled 2017-07-27 (×2): qty 0.4

## 2017-07-27 MED ORDER — CARVEDILOL 6.25 MG PO TABS: 6.2500 mg | ORAL_TABLET | Freq: Two times a day (BID) | ORAL | Status: DC

## 2017-07-27 MED ORDER — MAGNESIUM GLUCONATE 500 MG PO TABS
500.0000 mg | ORAL_TABLET | Freq: Two times a day (BID) | ORAL | Status: DC
  Administered 2017-07-27 – 2017-07-29 (×4): 500 mg
  Filled 2017-07-27 (×5): qty 1

## 2017-07-27 MED ORDER — IOPAMIDOL (ISOVUE-370) INJECTION 76%
INTRAVENOUS | Status: AC
  Filled 2017-07-27: qty 50

## 2017-07-27 MED ORDER — LIDOCAINE HCL (PF) 1 % IJ SOLN
INTRAMUSCULAR | Status: AC
  Filled 2017-07-27: qty 30

## 2017-07-27 MED ORDER — ACETAMINOPHEN 160 MG/5ML PO SOLN: 650.0000 mg | ORAL | Status: DC | PRN

## 2017-07-27 MED ORDER — ENOXAPARIN SODIUM 40 MG/0.4ML ~~LOC~~ SOLN: 40.0000 mg | SUBCUTANEOUS | Status: DC

## 2017-07-27 MED ORDER — POTASSIUM CHLORIDE 20 MEQ/15ML (10%) PO SOLN
20.0000 meq | Freq: Once | ORAL | Status: AC
  Administered 2017-07-27: 20 meq
  Filled 2017-07-27: qty 15

## 2017-07-27 MED ORDER — MIDAZOLAM HCL 2 MG/2ML IJ SOLN
INTRAMUSCULAR | Status: AC
  Filled 2017-07-27: qty 2

## 2017-07-27 MED ORDER — FENTANYL CITRATE (PF) 100 MCG/2ML IJ SOLN: 50.0000 ug | Freq: Once | INTRAMUSCULAR | Status: DC

## 2017-07-27 MED ORDER — ASPIRIN 81 MG PO CHEW
81.0000 mg | CHEWABLE_TABLET | Freq: Every day | ORAL | Status: DC
  Administered 2017-07-28 – 2017-07-29 (×2): 81 mg
  Filled 2017-07-27 (×2): qty 1

## 2017-07-27 MED ORDER — NITROGLYCERIN 1 MG/10 ML FOR IR/CATH LAB
INTRA_ARTERIAL | Status: AC
  Filled 2017-07-27: qty 10

## 2017-07-27 MED ORDER — FENTANYL BOLUS VIA INFUSION
25.0000 ug | INTRAVENOUS | Status: DC | PRN
  Filled 2017-07-27: qty 25

## 2017-07-27 MED ORDER — HEPARIN SODIUM (PORCINE) 1000 UNIT/ML IJ SOLN
INTRAMUSCULAR | Status: AC
  Filled 2017-07-27: qty 1

## 2017-07-27 MED ORDER — FENTANYL CITRATE (PF) 100 MCG/2ML IJ SOLN
INTRAMUSCULAR | Status: AC
  Filled 2017-07-27: qty 2

## 2017-07-27 MED ORDER — CARVEDILOL 6.25 MG PO TABS
6.2500 mg | ORAL_TABLET | Freq: Two times a day (BID) | ORAL | Status: DC
  Administered 2017-07-28 – 2017-07-29 (×3): 6.25 mg
  Filled 2017-07-27 (×3): qty 1

## 2017-07-27 MED ORDER — SODIUM CHLORIDE 0.9 % IV SOLN
INTRAVENOUS | Status: AC | PRN
  Administered 2017-07-27: 100 mL/h via INTRAVENOUS

## 2017-07-27 MED ORDER — IOPAMIDOL (ISOVUE-370) INJECTION 76%
INTRAVENOUS | Status: AC
  Filled 2017-07-27: qty 150

## 2017-07-27 MED ORDER — SODIUM CHLORIDE 0.9% FLUSH
3.0000 mL | Freq: Two times a day (BID) | INTRAVENOUS | Status: DC
  Administered 2017-07-27: 10 mL via INTRAVENOUS
  Administered 2017-07-28 (×2): 3 mL via INTRAVENOUS
  Administered 2017-07-29: 10 mL via INTRAVENOUS
  Administered 2017-07-29: 3 mL via INTRAVENOUS
  Administered 2017-07-30: 10 mL via INTRAVENOUS
  Administered 2017-07-31 – 2017-08-04 (×3): 3 mL via INTRAVENOUS

## 2017-07-27 MED ORDER — PANTOPRAZOLE SODIUM 40 MG IV SOLR
40.0000 mg | INTRAVENOUS | Status: DC
  Administered 2017-07-27 – 2017-08-01 (×6): 40 mg via INTRAVENOUS
  Filled 2017-07-27 (×6): qty 40

## 2017-07-27 MED ORDER — FUROSEMIDE 10 MG/ML IJ SOLN
40.0000 mg | Freq: Once | INTRAMUSCULAR | Status: AC
  Administered 2017-07-27: 40 mg via INTRAVENOUS
  Filled 2017-07-27: qty 4

## 2017-07-27 MED ORDER — MIDAZOLAM HCL 2 MG/2ML IJ SOLN
1.0000 mg | INTRAMUSCULAR | Status: DC | PRN
  Administered 2017-07-27 (×2): 1 mg via INTRAVENOUS
  Filled 2017-07-27 (×2): qty 2

## 2017-07-27 MED ORDER — ATORVASTATIN CALCIUM 80 MG PO TABS
80.0000 mg | ORAL_TABLET | Freq: Every day | ORAL | Status: DC
  Administered 2017-07-28 – 2017-07-29 (×2): 80 mg
  Filled 2017-07-27 (×2): qty 1

## 2017-07-27 MED ORDER — POTASSIUM CHLORIDE 10 MEQ/50ML IV SOLN
10.0000 meq | INTRAVENOUS | Status: AC
  Administered 2017-07-27 – 2017-07-28 (×4): 10 meq via INTRAVENOUS
  Filled 2017-07-27 (×4): qty 50

## 2017-07-27 MED ORDER — ONDANSETRON HCL 4 MG/2ML IJ SOLN
INTRAMUSCULAR | Status: AC
  Filled 2017-07-27: qty 2

## 2017-07-27 MED ORDER — ATORVASTATIN CALCIUM 80 MG PO TABS: 80.0000 mg | ORAL_TABLET | Freq: Every day | ORAL | Status: DC

## 2017-07-27 MED ORDER — MAGNESIUM GLUCONATE 500 MG PO TABS
500.0000 mg | ORAL_TABLET | Freq: Two times a day (BID) | ORAL | Status: DC
  Filled 2017-07-27: qty 1

## 2017-07-27 MED ORDER — HEPARIN SODIUM (PORCINE) 5000 UNIT/ML IJ SOLN
5000.0000 [IU] | Freq: Three times a day (TID) | INTRAMUSCULAR | Status: DC
Start: 2017-07-27 — End: 2017-07-27

## 2017-07-27 MED ORDER — MIDAZOLAM HCL 2 MG/2ML IJ SOLN
INTRAMUSCULAR | Status: DC | PRN
  Administered 2017-07-27: 2 mg via INTRAVENOUS

## 2017-07-27 SURGICAL SUPPLY — 18 items
BALLN SAPPHIRE 2.5X15 (BALLOONS) ×2
BALLN SAPPHIRE ~~LOC~~ 3.25X15 (BALLOONS) ×1 IMPLANT
BALLOON SAPPHIRE 2.5X15 (BALLOONS) IMPLANT
CATH 5FR JL3.5 JR4 ANG PIG MP (CATHETERS) ×1 IMPLANT
CATH LAUNCHER 6FR EBU3.5 (CATHETERS) ×1 IMPLANT
DEVICE RAD COMP TR BAND LRG (VASCULAR PRODUCTS) ×1 IMPLANT
GLIDESHEATH SLEND SS 6F .021 (SHEATH) ×1 IMPLANT
GUIDEWIRE INQWIRE 1.5J.035X260 (WIRE) IMPLANT
INQWIRE 1.5J .035X260CM (WIRE) ×2
KIT ENCORE 26 ADVANTAGE (KITS) ×2 IMPLANT
KIT HEART LEFT (KITS) ×2 IMPLANT
PACK CARDIAC CATHETERIZATION (CUSTOM PROCEDURE TRAY) ×2 IMPLANT
STENT SYNERGY DES 2.5X12 (Permanent Stent) ×1 IMPLANT
STENT SYNERGY DES 2.75X28 (Permanent Stent) ×1 IMPLANT
SYR MEDRAD MARK V 150ML (SYRINGE) ×2 IMPLANT
TRANSDUCER W/STOPCOCK (MISCELLANEOUS) ×2 IMPLANT
TUBING CIL FLEX 10 FLL-RA (TUBING) ×2 IMPLANT
WIRE ASAHI PROWATER 180CM (WIRE) ×1 IMPLANT

## 2017-07-27 NOTE — Progress Notes (Signed)
Code Blue note:  After the patient was transferred to the ICU post Cardiac cath/PCI she became more lethargic and dyspneic. She stopped breathing and developed severe bradycardia. CPR initiated. She was bagged for respiration. She was given 0.5 amp of IV atropine and one amp of IV epinephrine. She was intubated per anesthesia. There was copious frothy secretions c/w pulmonary edema. Patient then developed sustained ventricular tachycardia and required DC cardioversion x 1 with return to NSR and stable BP/pulse.   CXR portable shows diffuse pulmonary edema.  Plan: IV lasix  CCM consult for vent management.  IV sedation Patient's family updated on her condition.  Miliano Cotten [place] MD, [facility]  07/27/2017 6:18 PM

## 2017-07-27 NOTE — Anesthesia Procedure Notes (Signed)
Procedure Name: Intubation Date/Time: 07/27/2017 5:51 PM Performed by: Eligha Bridegroom, CRNA Pre-anesthesia Checklist: Patient identified, Emergency Drugs available, Suction available, Patient being monitored and Timeout performed Patient Re-evaluated:Patient Re-evaluated prior to induction Oxygen Delivery Method: Ambu bag Preoxygenation: Pre-oxygenation with 100% oxygen Induction Type: Rapid sequence and Cricoid Pressure applied Ventilation: Mask ventilation without difficulty Laryngoscope Size: Glidescope and 3 Grade View: Grade I Tube type: Subglottic suction tube Tube size: 7.0 mm Number of attempts: 1 Airway Equipment and Method: Stylet and Video-laryngoscopy Placement Confirmation: ETT inserted through vocal cords under direct vision,  breath sounds checked- equal and bilateral and CO2 detector Secured at: 21 cm Tube secured with: Tape Dental Injury: Teeth and Oropharynx as per pre-operative assessment

## 2017-07-27 NOTE — Procedures (Signed)
Central venous catheter placement  A central venous catheter was placed in order to sample central venous gases and in anticipation of the need for the reliable infusion of either dobutamine or milrinone in a patient and possible cardiogenic shock.  A timeout was performed and then the area over the left subclavian was extensively prepped with chlorhexidine.  Sterile garb was donned and the area widely draped.  The vein was easily cannulated and a wire gently passed.  The tract was dilated and a 20 cm 7 French triple-lumen catheter gently placed over the wire.  There was good flow from all ports.  The catheter was sutured in place at 20 cm.  A chest x-ray is pending.

## 2017-07-27 NOTE — Progress Notes (Signed)
PCCM Interval Note   ABG 7.203/56.7/107 currently on 8 cc/15/100 Decrease Fi02 and increase rate.  Repeat ABG ordered   Hayden Pedro, AGACNP-BC [facility]  Pgr: [phone]  PCCM Pgr: [phone]

## 2017-07-27 NOTE — Consult Note (Signed)
PULMONARY / CRITICAL CARE MEDICINE   Name: [name] MRN: [id number] DOB: [birth year]    ADMISSION DATE:  07/27/2017 CONSULTATION DATE:  07/27/2017  REFERRING MD:  [place], P  CHIEF COMPLAINT:  S/P bradycardic arrest  HISTORY OF PRESENT ILLNESS:        Unfortunately has very little history on this 83 year old who presented to the emergency room with chest pain and acute anterior lateral MI.  She was taken to the cardiac catheterization laboratory where she was found to have 3 vessels coronary disease.  A 100% ramus occlusion was felt to be the culprit vessel this was opened and 2 drug-eluting stents were placed.  On arrival in the coronary intensive care unit the patient suffered from a bradycardic arrest requiring atropine epinephrine and brief CPR for resuscitation.  Following resuscitation she is purposely moving all fours.  PAST MEDICAL HISTORY :  She  has a past medical history of Anxiety, Breast cancer ([place]), Breast cancer of lower-outer quadrant of left female breast ([place]) (10/11/2014), GERD (gastroesophageal reflux disease), Hypertension, Personal history of radiation therapy, PONV (postoperative nausea and vomiting), PUD (peptic ulcer disease), and Stone, kidney.  PAST SURGICAL HISTORY: She  has a past surgical history that includes Breast lumpectomy with sentinel lymph node bx (Left, 10/22/2014) and Re-excision of breast cancer,superior margins (Left, 11/04/2014).  Allergies  Allergen Reactions  . Lisinopril     D/c lisinopril due to cough    No current facility-administered medications on file prior to encounter.    Current Outpatient Medications on File Prior to Encounter  Medication Sig  . anastrozole (ARIMIDEX) 1 MG tablet TAKE ONE TABLET BY MOUTH ONCE DAILY  . Ascorbic Acid (VITAMIN C) 1000 MG tablet Take 1,000 mg by mouth daily.  Marland Kitchen aspirin 81 MG tablet Take 81 mg by mouth 3 (three) times a week.   . Calcium Carb-Cholecalciferol (CALCIUM + D3) 600-800 MG-UNIT TABS Take  by mouth.  . cholecalciferol (VITAMIN D) 1000 UNITS tablet Take 1,000 Units by mouth daily. Over the counter  . co-enzyme Q-10 30 MG capsule Take 30 mg by mouth 3 (three) times daily.  . fish oil-omega-3 fatty acids 1000 MG capsule Take 1 g by mouth daily.  Marland Kitchen GARLIC PO Take by mouth daily.  Marland Kitchen losartan (COZAAR) 100 MG tablet Take 1 tablet (100 mg total) by mouth daily.  . magnesium gluconate (MAGONATE) 500 MG tablet Take 500 mg by mouth 2 (two) times daily.  . Multiple Vitamin (MULTIVITAMIN) tablet Take 1 tablet by mouth daily.    FAMILY HISTORY:  Her indicated that her mother is deceased. She indicated that her father is deceased. She indicated that both of her sisters are alive. She indicated that two of her five brothers are alive.   SOCIAL HISTORY: She  reports that  has never smoked. she has never used smokeless tobacco. She reports that she does not drink alcohol or use drugs.  REVIEW OF SYSTEMS:   Unobtainable  SUBJECTIVE:  Unobtainable  VITAL SIGNS: BP (!) 156/88   Pulse (!) 126   Resp (!) 33   Ht 5\' 8"  (1.727 m)   Wt 169 lb 5 oz (76.8 kg)   SpO2 97%   BMI 25.74 kg/m   HEMODYNAMICS:    VENTILATOR SETTINGS: Vent Mode: PRVC FiO2 (%):  [100 %] 100 % Set Rate:  [15 bmp] 15 bmp Vt Set:  [510 mL] 510 mL PEEP:  [5 cmH20] 5 cmH20  INTAKE / OUTPUT: No intake/output data recorded.  PHYSICAL EXAMINATION:  General: She is orally intubated and mechanically ventilated.  She is intermittently agitated.   Neuro: Prior to sedation with fentanyl she was not following instructions however she was purposely moving all fours very vigorously.  She had pupils are equal and reactive. Cardiovascular: S1 and S2 are regular without murmur rub or gallop.  There is JVD to the angle of the jaw with a head elevated to 30 degrees. Lungs: Operations are unlabored, she is orally intubated.  There is symmetric air movement, lots of scattered rhonchi and wheezes. Abdomen: The abdomen is soft  without any organomegaly masses tenderness guarding or rebound.  Musculoskeletal: Catheter access was via the right radial artery, capillary refill is symmetric in the right and left hands.  All of the extremities are cool but pink.   LABS:  BMET Recent Labs  Lab 07/27/17 1626 07/27/17 1649  NA 141 141  K 3.5 3.9  CL 102 109  CO2 19*  --   BUN 27* 27*  CREATININE 1.28* 1.30*  GLUCOSE 132* 132*    Electrolytes Recent Labs  Lab 07/27/17 1626  CALCIUM 9.6    CBC Recent Labs  Lab 07/27/17 1626 07/27/17 1649  WBC 8.7  --   HGB 11.1* 11.9*  HCT 34.6* 35.0*  PLT 251  --     Coag's Recent Labs  Lab 07/27/17 1626  APTT 29  INR 1.04    Sepsis Markers No results for input(s): LATICACIDVEN, PROCALCITON, O2SATVEN in the last 168 hours.  ABG No results for input(s): PHART, PCO2ART, PO2ART in the last 168 hours.  Liver Enzymes Recent Labs  Lab 07/27/17 1626  AST 29  ALT 19  ALKPHOS 38  BILITOT 0.7  ALBUMIN 3.7    Cardiac Enzymes Recent Labs  Lab 07/27/17 1626  TROPONINI 0.03*    Glucose No results for input(s): GLUCAP in the last 168 hours.  Imaging Dg Chest Port 1v Same Day  Result Date: 07/27/2017 CLINICAL DATA:  Pulmonary edema and shortness of breath EXAM: PORTABLE CHEST 1 VIEW COMPARISON:  02/01/2011 chest radiograph FINDINGS: An endotracheal tube with tip 4.5 cm above the carina noted. Cardiomegaly identified. Diffuse bilateral airspace opacities likely represents edema. There may be trace bilateral pleural effusions present. There is no evidence of pneumothorax. IMPRESSION: Diffuse bilateral airspace opacities likely representing pulmonary edema. Cardiomegaly. Electronically Signed   By: Margarette Canada M.D.   On: 07/27/2017 18:21     DISCUSSION:      This is an 83 year old with "prediabetes", who presented with chest pain and acute anterior lateral MI.  She underwent placement of 2 drug-eluting stents in the ramus.  He subsequently suffered from a  bradycardic arrest and has a chest x-ray consistent with florid pulmonary edema.  The periphery is cool.  ASSESSMENT / PLAN:  PULMONARY A: Intubation in this case is both to improve oxygenation and to minimize the work of breathing in a patient who may have very limited cardiac output.  I have no plan to transfer any work of breathing back to the patient until she clearly demonstrates hemodynamic stability.  Blood gas is pending to make ventilator adjustments.  CARDIOVASCULAR A: I am concerned with her florid CHF on chest x-ray and cool periphery that we may have very limited cardiac output.  I placed a central line both to allow sampling of central venous saturations as a surrogate for cardiac output and to allow the reliable infusion of vasoactive's including potentially dobutamine or milrinone if indicated.  She has received 2 drug-eluting  stents and will require dual antiplatelet therapy.   A history of "prediabetes", is given and I placed her on subcu sliding scale insulin.  GI prophylaxis is with Protonix and DVT prophylaxis with subcu insulin.  Rater than 32 minutes has been spent in the care of this patient who has life-threatening hemodynamic instability and was just suffered from a bradycardic arrest.  Lars Masson, MD Pulmonary and [place] Pager: [phone]  07/27/2017, 7:00 PM

## 2017-07-27 NOTE — Procedures (Signed)
Arterial line placement  An arterial line was placed immediately following a bradycardic arrest for frequent blood gas sampling in a patient who appears to be in florid congestive heart failure and potentially cardiogenic shock.  The area of the left groin was extensively placed with chlorhexidine and then sterilely draped.  Using sterile technique and anatomic landmarks the femoral artery was easily cannulated.  A wire was gently passed and then a 20-gauge arterial catheter placed over the wire.  There was good tracing and flow and no immediate complication.  The catheter was sutured in place and a sterile dressing applied.

## 2017-07-27 NOTE — ED Triage Notes (Signed)
Pt arrives to ED as activated code Stemi from home pain started sudden;y while on commode 1 hour PTA, pt was given 4 81 mg ASA 4 mg morphine and 3 sl nitro PTA. Pt arrives in 9/10 chest pain MD [place] at bedside pt taken to Cath lab with this RN, emt and MD [place].

## 2017-07-27 NOTE — Progress Notes (Signed)
Pt arrived from cath lab able to speak 1 word responses. Moaning intermittently. Placed on unit monitor, sats 33%, HR 120s. RT paged. Pt began coughing up white, frothy sputum and brady'd. Code Blue called. See code sheet for further details.  Pt started on Fent gtt at 117mcg/hr per Dr Pearline Cables who is inserting a CVC and A-line at this time.

## 2017-07-27 NOTE — Progress Notes (Signed)
Code Blue called 5:45 pm Code team worked with patient and she did well.  Fluid still an issue in lungs but they are working on it.  Met with her family after code was over and Dr. was giving them information about her. They-her husband and son are waiting and want to see her once the team is finished working with her. We had prayer together and they requested prayer with her when they are able to go back to see her. Conard Novak, Chaplain    07/27/17 1800  Clinical Encounter Type  Visited With Family  Visit Type Initial;Code  Referral From Nurse  Consult/Referral To Chaplain  Spiritual Encounters  Spiritual Needs Prayer;Emotional  Stress Factors  Patient Stress Factors Other (Comment) (code blue)  Family Stress Factors Not reviewed

## 2017-07-27 NOTE — H&P (Signed)
Cardiology Admission History and Physical:   Patient ID: [name]; MRN: [id number]; DOB: 16-Jul-1934   Admission date: 07/27/2017  Primary Care Provider: Sherren Mocha, MD Primary Cardiologist: No primary care provider on file. new   Chief Complaint:  Chest pain  Patient Profile:   [name] is a 83 y.o. female with a history of breast CA, HTN, HLD presents with acute onset of chest pain today. Ecg c/w lateral STEMI  History of Present Illness:   [name] is an 83 yo BF. Generally has been in good health. History of HTN, HLD, osteoporosis and left breast CA s/p lumpectomy, RT and chronic chemo Rx with Arimidex. No prior cardiac disease. Was admitted in 2012 with chest pain thought to be GERD. Was supposed to have outpatient Echo/stress test but unclear if this was ever done.  Today complained of severe midsternal chest pain. Duration of pain unclear. Husband was told to call EMS. No N/V. Positive SOB. Ecg showed acute ST elevation in leads 1, AVL with reciprocal ST depression inferiorly. Code STEMI activated.    Past Medical History:  Diagnosis Date  . Anxiety   . Breast cancer ([facility])   . Breast cancer of lower-outer quadrant of left female breast ([facility]) 10/11/2014  . GERD (gastroesophageal reflux disease)   . Hypertension   . Personal history of radiation therapy    2016  . PONV (postoperative nausea and vomiting)   . PUD (peptic ulcer disease)   . Stone, kidney     Past Surgical History:  Procedure Laterality Date  . BREAST LUMPECTOMY WITH SENTINEL LYMPH NODE BIOPSY Left 10/22/2014   Procedure: BREAST LUMPECTOMY WITH SENTINEL LYMPH NODE BIOPSY;  Surgeon: Chevis Pretty III, MD;  Location: [facility];  Service: General;  Laterality: Left;  . RE-EXCISION OF BREAST CANCER,SUPERIOR MARGINS Left 11/04/2014   Procedure: RE-EXCISION OF LEFT BREAST INFERIOR MARGINS;  Surgeon: Chevis Pretty III, MD;  Location: [facility] OR;  Service: General;  Laterality: Left;      Medications Prior to Admission: Prior to Admission medications   Medication Sig Start Date End Date Taking? Authorizing Provider  anastrozole (ARIMIDEX) 1 MG tablet TAKE ONE TABLET BY MOUTH ONCE DAILY 04/05/17   Magrinat, [name], MD  Ascorbic Acid (VITAMIN C) 1000 MG tablet Take 1,000 mg by mouth daily.    [provider]  aspirin 81 MG tablet Take 81 mg by mouth 3 (three) times a week.     [provider]  Calcium Carb-Cholecalciferol (CALCIUM + D3) 600-800 MG-UNIT TABS Take by mouth.    [provider]  cholecalciferol (VITAMIN D) 1000 UNITS tablet Take 1,000 Units by mouth daily. Over the counter    [provider]  co-enzyme Q-10 30 MG capsule Take 30 mg by mouth 3 (three) times daily.    [provider]  fish oil-omega-3 fatty acids 1000 MG capsule Take 1 g by mouth daily.    [provider]  GARLIC PO Take by mouth daily.    [provider]  losartan (COZAAR) 100 MG tablet Take 1 tablet (100 mg total) by mouth daily. 07/13/17   Sherren Mocha, MD  magnesium gluconate (MAGONATE) 500 MG tablet Take 500 mg by mouth 2 (two) times daily.    [provider]  Multiple Vitamin (MULTIVITAMIN) tablet Take 1 tablet by mouth daily.    [provider]     Allergies:    Allergies  Allergen Reactions  . Lisinopril  D/c lisinopril due to cough    Social History:   Social History   Socioeconomic History  . Marital status: Married    Spouse name: Not on file  . Number of children: Not on file  . Years of education: Not on file  . Highest education level: Not on file  Social Needs  . Financial resource strain: Not on file  . Food insecurity - worry: Not on file  . Food insecurity - inability: Not on file  . Transportation needs - medical: Not on file  . Transportation needs - non-medical: Not on file  Occupational History  . Occupation: retired  Tobacco Use  . Smoking status: Never Smoker  . Smokeless  tobacco: Never Used  Substance and Sexual Activity  . Alcohol use: No  . Drug use: No  . Sexual activity: Not on file  Other Topics Concern  . Not on file  Social History Narrative  . Not on file    Family History:   The patient's family history includes Heart disease in her brother; Hypertension in her brother, brother, sister, and sister; Pancreatic cancer in her mother; Stroke in her father.    ROS:  Please see the history of present illness.  All other ROS reviewed and negative.     Physical Exam/Data:   Vitals:   07/27/17 1708 07/27/17 1713 07/27/17 1718 07/27/17 1759  BP: 139/84 (!) 138/91 (!) 151/92 (!) 175/97  Pulse: 90 79 80 (!) 131  Resp: 19 (!) 33 (!) 112 (!) 21  SpO2: 93% 93% 96% 100%  Height:    5\' 8"  (1.727 m)   No intake or output data in the 24 hours ending 07/27/17 1819 There were no vitals filed for this visit. Body mass index is 27.03 kg/m.  General:  Well nourished, elderly, in moderate acute distress. Appears ashy HEENT: normal Lymph: no adenopathy Neck:  JVD elevated Endocrine:  No thryomegaly Vascular: No carotid bruits; FA pulses 2+ bilaterally without bruits  Cardiac:  normal S1, S2; RRR; no murmur  Lungs:  bibasilar rales  Abd: soft, nontender, no hepatomegaly  Ext: no edema Musculoskeletal:  No deformities, BUE and BLE strength normal and equal Skin: warm and dry  Neuro:  CNs 2-12 intact, no focal abnormalities noted Psych:  Normal affect    EKG:  The ECG that was done today was personally reviewed and demonstrates sinus brady with ST elevation 1 and AVL. Reciprocal ST depression in 2,3,AVF and leads V3-4. I have personally reviewed and interpreted this study.   Relevant CV Studies: none  Laboratory Data:  Chemistry Recent Labs  Lab 07/27/17 1626 07/27/17 1649  NA 141 141  K 3.5 3.9  CL 102 109  CO2 19*  --   GLUCOSE 132* 132*  BUN 27* 27*  CREATININE 1.28* 1.30*  CALCIUM 9.6  --   GFRNONAA 38*  --   GFRAA 45*  --     ANIONGAP 20*  --     Recent Labs  Lab 07/27/17 1626  PROT 6.5  ALBUMIN 3.7  AST 29  ALT 19  ALKPHOS 38  BILITOT 0.7   Hematology Recent Labs  Lab 07/27/17 1626 07/27/17 1649  WBC 8.7  --   RBC 3.74*  --   HGB 11.1* 11.9*  HCT 34.6* 35.0*  MCV 92.5  --   MCH 29.7  --   MCHC 32.1  --   RDW 13.2  --   PLT 251  --    Cardiac Enzymes  Recent Labs  Lab 07/27/17 1626  TROPONINI 0.03*    Recent Labs  Lab 07/27/17 1648  TROPIPOC 0.03    BNPNo results for input(s): BNP, PROBNP in the last 168 hours.  DDimer No results for input(s): DDIMER in the last 168 hours.  Radiology/Studies:  No results found.  Assessment and Plan:   1. Acute lateral STEMI. Duration of symptoms unclear. Risk factors of age, HTN, HLD. Given ASA chewable en route. Will proceed directly to cardiac cath lab for emergent cardiac cath/PCI.  2. Hypoxia. Increase oxygen therapy. Suspect some degree of pulmonary edema 3. HTN 4. Hypercholesterolemia. Will need high dose statin.  5. History of breast CA. Estrogen receptor positive. On Arimidex.   Severity of Illness: The appropriate patient status for this patient is INPATIENT. Inpatient status is judged to be reasonable and necessary in order to provide the required intensity of service to ensure the patient's safety. The patient's presenting symptoms, physical exam findings, and initial radiographic and laboratory data in the context of their chronic comorbidities is felt to place them at high risk for further clinical deterioration. Furthermore, it is not anticipated that the patient will be medically stable for discharge from the hospital within 2 midnights of admission. The following factors support the patient status of inpatient.   " The patient's presenting symptoms include acute chest pain. " The worrisome physical exam findings include distress, hypoxia. " The initial radiographic and laboratory data are worrisome because of ST elevation on Ecg-  lateral leads. " The chronic co-morbidities include HTN, HLD, age.   * I certify that at the point of admission it is my clinical judgment that the patient will require inpatient hospital care spanning beyond 2 midnights from the point of admission due to high intensity of service, high risk for further deterioration and high frequency of surveillance required.*    For questions or updates, please contact [facility] Please consult [email or web address] for contact info under Cardiology/STEMI.    Signed, Greogry Goodwyn [place], MD  07/27/2017 6:19 PM

## 2017-07-28 ENCOUNTER — Inpatient Hospital Stay (HOSPITAL_COMMUNITY): Payer: Medicare Other

## 2017-07-28 ENCOUNTER — Encounter (HOSPITAL_COMMUNITY): Payer: Self-pay | Admitting: Emergency Medicine

## 2017-07-28 DIAGNOSIS — R570 Cardiogenic shock: Secondary | ICD-10-CM

## 2017-07-28 DIAGNOSIS — N179 Acute kidney failure, unspecified: Secondary | ICD-10-CM

## 2017-07-28 DIAGNOSIS — I472 Ventricular tachycardia: Secondary | ICD-10-CM

## 2017-07-28 DIAGNOSIS — I361 Nonrheumatic tricuspid (valve) insufficiency: Secondary | ICD-10-CM

## 2017-07-28 DIAGNOSIS — J9601 Acute respiratory failure with hypoxia: Secondary | ICD-10-CM

## 2017-07-28 DIAGNOSIS — I469 Cardiac arrest, cause unspecified: Secondary | ICD-10-CM

## 2017-07-28 DIAGNOSIS — L8990 Pressure ulcer of unspecified site, unspecified stage: Secondary | ICD-10-CM

## 2017-07-28 LAB — CBC WITH DIFFERENTIAL/PLATELET
Basophils Absolute: 0 10*3/uL (ref 0.0–0.1)
Basophils Relative: 0 %
EOS ABS: 0 10*3/uL (ref 0.0–0.7)
EOS PCT: 0 %
HCT: 38.8 % (ref 36.0–46.0)
Hemoglobin: 12.6 g/dL (ref 12.0–15.0)
LYMPHS ABS: 0.6 10*3/uL — AB (ref 0.7–4.0)
Lymphocytes Relative: 4 %
MCH: 29.9 pg (ref 26.0–34.0)
MCHC: 32.5 g/dL (ref 30.0–36.0)
MCV: 91.9 fL (ref 78.0–100.0)
MONO ABS: 0.8 10*3/uL (ref 0.1–1.0)
MONOS PCT: 6 %
Neutro Abs: 12.6 10*3/uL — ABNORMAL HIGH (ref 1.7–7.7)
Neutrophils Relative %: 90 %
Platelets: 247 10*3/uL (ref 150–400)
RBC: 4.22 MIL/uL (ref 3.87–5.11)
RDW: 13 % (ref 11.5–15.5)
WBC: 14 10*3/uL — ABNORMAL HIGH (ref 4.0–10.5)

## 2017-07-28 LAB — LIPID PANEL
Cholesterol: 274 mg/dL — ABNORMAL HIGH (ref 0–200)
HDL: 62 mg/dL (ref >40)
LDL Cholesterol: 191 mg/dL — ABNORMAL HIGH (ref 0–99)
Total CHOL/HDL Ratio: 4.4 RATIO
Triglycerides: 106 mg/dL (ref <150)
VLDL: 21 mg/dL (ref 0–40)

## 2017-07-28 LAB — ECHOCARDIOGRAM COMPLETE
CHL CUP DOP CALC LVOT VTI: 10.7 cm
CHL CUP MV DEC (S): 250
E/e' ratio: 6.37
EWDT: 250 ms
FS: 24 % — AB (ref 28–44)
Height: 68 in
IV/PV OW: 0.97
LA ID, A-P, ES: 42 mm
LA diam index: 2.15 cm/m2
LA vol A4C: 88.4 ml
LAVOL: 86.1 mL
LAVOLIN: 44.1 mL/m2
LDCA: 3.8 cm2
LEFT ATRIUM END SYS DIAM: 42 mm
LV E/e'average: 6.37
LV PW d: 12.1 mm — AB (ref 0.6–1.1)
LV e' LATERAL: 11.8 cm/s
LVEEMED: 6.37
LVOT SV: 41 mL
LVOT peak vel: 65 cm/s
LVOTD: 22 mm
Lateral S' vel: 16.6 cm/s
MV Peak grad: 2 mmHg
MV pk E vel: 75.2 m/s
MVPKAVEL: 45.6 m/s
PISA EROA: 0.07 cm2
RV sys press: 65 mmHg
Reg peak vel: 394 cm/s
TAPSE: 20.8 mm
TDI e' lateral: 11.8
TDI e' medial: 5.8
TR max vel: 394 cm/s
VTI: 161 cm
Weight: 2765.45 oz

## 2017-07-28 LAB — COMPREHENSIVE METABOLIC PANEL
ALBUMIN: 3.2 g/dL — AB (ref 3.5–5.0)
ALK PHOS: 38 U/L (ref 38–126)
ALT: 211 U/L — AB (ref 14–54)
AST: 521 U/L — AB (ref 15–41)
Anion gap: 11 (ref 5–15)
BUN: 28 mg/dL — AB (ref 6–20)
CALCIUM: 9 mg/dL (ref 8.9–10.3)
CHLORIDE: 110 mmol/L (ref 101–111)
CO2: 18 mmol/L — AB (ref 22–32)
CREATININE: 1.35 mg/dL — AB (ref 0.44–1.00)
GFR calc Af Amer: 42 mL/min — ABNORMAL LOW (ref >60)
GFR calc non Af Amer: 36 mL/min — ABNORMAL LOW (ref >60)
GLUCOSE: 114 mg/dL — AB (ref 65–99)
Potassium: 4.8 mmol/L (ref 3.5–5.1)
SODIUM: 139 mmol/L (ref 135–145)
Total Bilirubin: 0.7 mg/dL (ref 0.3–1.2)
Total Protein: 6.1 g/dL — ABNORMAL LOW (ref 6.5–8.1)

## 2017-07-28 LAB — GLUCOSE, CAPILLARY
GLUCOSE-CAPILLARY: 98 mg/dL (ref 65–99)
GLUCOSE-CAPILLARY: 126 mg/dL — AB (ref 65–99)
GLUCOSE-CAPILLARY: 135 mg/dL — AB (ref 65–99)
Glucose-Capillary: 129 mg/dL — ABNORMAL HIGH (ref 65–99)
Glucose-Capillary: 80 mg/dL (ref 65–99)
Glucose-Capillary: 103 mg/dL — ABNORMAL HIGH (ref 65–99)

## 2017-07-28 LAB — POCT I-STAT 3, ART BLOOD GAS (G3+)
ACID-BASE DEFICIT: 4 mmol/L — AB (ref 0.0–2.0)
Bicarbonate: 20.5 mmol/L (ref 20.0–28.0)
O2 Saturation: 98 %
PH ART: 7.38 (ref 7.350–7.450)
TCO2: 21 mmol/L — ABNORMAL LOW (ref 22–32)
pCO2 arterial: 34.7 mmHg (ref 32.0–48.0)
pO2, Arterial: 102 mmHg (ref 83.0–108.0)

## 2017-07-28 LAB — TROPONIN I: Troponin I: >65 ng/mL (ref <0.03)

## 2017-07-28 LAB — MAGNESIUM: MAGNESIUM: 2.3 mg/dL (ref 1.7–2.4)

## 2017-07-28 LAB — PHOSPHORUS: Phosphorus: 4.7 mg/dL — ABNORMAL HIGH (ref 2.5–4.6)

## 2017-07-28 MED FILL — Medication: Qty: 1 | Status: AC

## 2017-07-28 NOTE — Consult Note (Signed)
PULMONARY / CRITICAL CARE MEDICINE   Name: [name] MRN: [id number] DOB: 05/12/1935    ADMISSION DATE:  07/27/2017 CONSULTATION DATE:  07/27/2017  REFERRING MD:  [place], P  CHIEF COMPLAINT:  S/P bradycardic arrest  HISTORY OF PRESENT ILLNESS:        Unfortunately has very little history on this 83 year old who presented to the emergency room with chest pain and acute anterior lateral MI.  She was taken to the cardiac catheterization laboratory where she was found to have 3 vessels coronary disease.  A 100% ramus occlusion was felt to be the culprit vessel this was opened and 2 drug-eluting stents were placed.  On arrival in the coronary intensive care unit the patient suffered from a bradycardic arrest requiring atropine epinephrine and brief CPR for resuscitation.  Following resuscitation she is purposely moving all fours.  SUBJECTIVE:   No acute issues overnight.  This morning denies chest pain.  Was able to be weaned down on ventilator and now doing well on spontaneous breathing trial.  Following all commands.  Not diuresed significantly.   PAST MEDICAL HISTORY :  She  has a past medical history of Anxiety, Breast cancer (HCC), Breast cancer of lower-outer quadrant of left female breast (HCC) (10/11/2014), GERD (gastroesophageal reflux disease), Hypertension, Personal history of radiation therapy, PONV (postoperative nausea and vomiting), PUD (peptic ulcer disease), and Stone, kidney.  PAST SURGICAL HISTORY: She  has a past surgical history that includes Breast lumpectomy with sentinel lymph node bx (Left, 10/22/2014) and Re-excision of breast cancer,superior margins (Left, 11/04/2014).  Allergies  Allergen Reactions  . Lisinopril     D/c lisinopril due to cough    No current facility-administered medications on file prior to encounter.    Current Outpatient Medications on File Prior to Encounter  Medication Sig  . anastrozole (ARIMIDEX) 1 MG tablet TAKE ONE TABLET BY MOUTH ONCE  DAILY  . losartan (COZAAR) 100 MG tablet Take 1 tablet (100 mg total) by mouth daily.  . Ascorbic Acid (VITAMIN C) 1000 MG tablet Take 1,000 mg by mouth daily.  Marland Kitchen aspirin 81 MG tablet Take 81 mg by mouth 3 (three) times a week.   . Calcium Carb-Cholecalciferol (CALCIUM + D3) 600-800 MG-UNIT TABS Take by mouth.  . cholecalciferol (VITAMIN D) 1000 UNITS tablet Take 1,000 Units by mouth daily. Over the counter  . co-enzyme Q-10 30 MG capsule Take 30 mg by mouth 3 (three) times daily.  . fish oil-omega-3 fatty acids 1000 MG capsule Take 1 g by mouth daily.  Marland Kitchen GARLIC PO Take by mouth daily.  . magnesium gluconate (MAGONATE) 500 MG tablet Take 500 mg by mouth 2 (two) times daily.  . Multiple Vitamin (MULTIVITAMIN) tablet Take 1 tablet by mouth daily.    FAMILY HISTORY:  Her indicated that her mother is deceased. She indicated that her father is deceased. She indicated that both of her sisters are alive. She indicated that two of her five brothers are alive.   SOCIAL HISTORY: She  reports that  has never smoked. she has never used smokeless tobacco. She reports that she does not drink alcohol or use drugs.  REVIEW OF SYSTEMS:   Unobtainable  SUBJECTIVE:  Unobtainable  VITAL SIGNS: BP (!) 142/76   Pulse 87   Temp 99 F (37.2 C) (Oral)   Resp (!) 24   Ht 5\' 8"  (1.727 m)   Wt 172 lb 13.5 oz (78.4 kg)   SpO2 93%   BMI 26.28 kg/m  HEMODYNAMICS: CVP:  [4 mmHg-5 mmHg] 4 mmHg  VENTILATOR SETTINGS: Vent Mode: PSV;CPAP FiO2 (%):  [40 %-100 %] 40 % Set Rate:  [15 bmp-24 bmp] 24 bmp Vt Set:  [510 mL] 510 mL PEEP:  [5 cmH20] 5 cmH20 Pressure Support:  [5 cmH20] 5 cmH20 Plateau Pressure:  [16 cmH20-21 cmH20] 20 cmH20  INTAKE / OUTPUT: I/O last 3 completed shifts: In: 898.3 [I.V.:698.3; IV Piggyback:200] Out: 1245 [Urine:1245]  PHYSICAL EXAMINATION: General: She is orally intubated and mechanically ventilated.  She is calm and non-distressed  Neuro: awake, alert on fentanyl 100  mcg infusion. Following commands in all 4 extremities. Nods yes/no to questions appropriately Cardiovascular: S1 and S2 are regular without murmur rub or gallop.  There is JVD to the angle of the jaw with a head elevated to 30 degrees. Lungs: Operations are unlabored, she is orally intubated.  There is symmetric air movement, clear anteriorly Abdomen: The abdomen is soft without any organomegaly masses tenderness guarding or rebound.   Musculoskeletal: Catheter access was via the right radial artery, capillary refill is symmetric in the right and left hands.  All of the extremities are warm  LABS:  BMET Recent Labs  Lab 07/27/17 1626  07/27/17 1939 07/27/17 2036 07/28/17 0107  NA 141   < > 139 142 139  K 3.5   < > 3.4* 3.4* 4.8  CL 102   < > 107 106 110  CO2 19*  --  20*  --  18*  BUN 27*   < > 27* 28* 28*  CREATININE 1.28*   < > 1.27* 1.20* 1.35*  GLUCOSE 132*   < > 229* 230* 114*   < > = values in this interval not displayed.    Electrolytes Recent Labs  Lab 07/27/17 1626 07/27/17 1939 07/28/17 0107  CALCIUM 9.6 9.3 9.0  MG  --  2.1  --     CBC Recent Labs  Lab 07/27/17 1626  07/27/17 1939 07/27/17 2036 07/28/17 0107  WBC 8.7  --  19.9*  --  14.0*  HGB 11.1*   < > 12.4 15.0 12.6  HCT 34.6*   < > 38.6 44.0 38.8  PLT 251  --  267  --  247   < > = values in this interval not displayed.    Coag's Recent Labs  Lab 07/27/17 1626  APTT 29  INR 1.04    Sepsis Markers Recent Labs  Lab 07/27/17 1850  LATICACIDVEN 1.4    ABG Recent Labs  Lab 07/27/17 2020 07/27/17 2214 07/28/17 0421  PHART 7.203* 7.285* 7.380  PCO2ART 56.7* 42.0 34.7  PO2ART 107.0 108.0 102.0    Liver Enzymes Recent Labs  Lab 07/27/17 1626 07/27/17 1939 07/28/17 0107  AST 29 431* 521*  ALT 19 222* 211*  ALKPHOS 38 43 38  BILITOT 0.7 0.6 0.7  ALBUMIN 3.7 3.5 3.2*    Cardiac Enzymes Recent Labs  Lab 07/27/17 1626 07/27/17 1907 07/28/17 0107  TROPONINI 0.03* 56.25*  >65.00*    Glucose Recent Labs  Lab 07/27/17 2317 07/28/17 0331  GLUCAP 145* 98    Imaging Dg Chest Port 1 View  Result Date: 07/28/2017 CLINICAL DATA:  Followup ventilator support. EXAM: PORTABLE CHEST 1 VIEW COMPARISON:  07/27/2017 FINDINGS: Endotracheal tube tip is 4 cm above the carina. Nasogastric tube enters the abdomen. Left subclavian central line tip in the SVC just above the right atrium. Widespread bilateral pulmonary opacity persists. There may be enlarging pleural effusions. Otherwise, very similar  appearance to yesterday. IMPRESSION: Lines and tubes well positioned. Persistent diffuse edema pattern. Question developing effusions. Electronically Signed   By: Paulina Fusi M.D.   On: 07/28/2017 06:37   Dg Chest Port 1 View  Result Date: 07/27/2017 CLINICAL DATA:  Congestive heart failure. On ventilator. Central line placement. EXAM: PORTABLE CHEST 1 VIEW COMPARISON:  Prior today FINDINGS: A new left subclavian central venous catheter seen with tip overlying the distal SVC. No evidence of pneumothorax. Endotracheal tube remains in appropriate position. Mild improvement in diffuse bilateral airspace disease is consistent with decreased pulmonary edema. Heart size remains stable. IMPRESSION: New left subclavian central venous catheter in appropriate position. No pneumothorax visualized. Improvement in bilateral airspace disease, consistent with decreased pulmonary edema. Electronically Signed   By: Myles Rosenthal M.D.   On: 07/27/2017 19:39   Dg Chest Port 1v Same Day  Result Date: 07/27/2017 CLINICAL DATA:  Pulmonary edema and shortness of breath EXAM: PORTABLE CHEST 1 VIEW COMPARISON:  02/01/2011 chest radiograph FINDINGS: An endotracheal tube with tip 4.5 cm above the carina noted. Cardiomegaly identified. Diffuse bilateral airspace opacities likely represents edema. There may be trace bilateral pleural effusions present. There is no evidence of pneumothorax. IMPRESSION: Diffuse  bilateral airspace opacities likely representing pulmonary edema. Cardiomegaly. Electronically Signed   By: Harmon Pier M.D.   On: 07/27/2017 18:21   Dg Abd Portable 1v  Result Date: 07/27/2017 CLINICAL DATA:  OG tube placement. EXAM: PORTABLE ABDOMEN - 1 VIEW COMPARISON:  None. FINDINGS: An OG tube is noted with tip overlying the proximal-mid stomach. Side hole lies at the EG junction. The bowel gas pattern is unremarkable. LOWER lung airspace disease/consolidation again noted. IMPRESSION: OG tube with tip overlying the proximal-mid stomach with side hole at the EG junction. Recommend advancement. Electronically Signed   By: Harmon Pier M.D.   On: 07/27/2017 22:40     DISCUSSION:      This is an 83 year old with "prediabetes", who presented with chest pain and acute anterior lateral MI.  She underwent placement of 2 drug-eluting stents in the ramus.  She subsequently suffered from a bradycardic arrest and has a chest x-ray consistent with florid pulmonary edema and cool extremities consistent with cardiogenic shock  ASSESSMENT / PLAN:  PULMONARY A: s/p intubation for assystole, hypoxemic respiratory failure due to pulmonary edema - ventilator adjusted overnight with improved ventilation - respiratory status significantly improved - weaned down on tolerating SBT, hemodynamically stable, warm and well perfused, mentating very clearly.  We can follow up on how well she does on her SBT and consider for extubation, however if she has any concerns that she would not tolerate this then we will hold off until tomorrow given her cardiac arrest yesterday  CARDIOVASCULAR A: Assystole Arrest Cardiogenic shock, now resolved - hemodyncamics now much improved.  The most likely explanation for her arrest/hypoxemia 2/23 seems to be flash pulmonary edema given the rapidity of her improvement.  May also be improved now that she has been revascularized   DM vs pre-diabetes - sliding scale insulin.    GI  prophylaxis is with Protonix and DVT prophylaxis with subcu insulin.   [place] Eliyana Pagliaro, MD [facility] Pulmonology/Critical Care Pager [phone] After hours pager: [phone]   07/28/2017, 8:11 AM

## 2017-07-28 NOTE — ED Provider Notes (Addendum)
[facility] Provider Note   CSN: [id number] Arrival date & time: 07/27/17  1617     History   Chief Complaint Chief Complaint  Patient presents with  . Code STEMI    HPI [name] is a 83 y.o. female.   Chest Pain   This is a new problem. The current episode started less than 1 hour ago. The problem occurs constantly. The problem has not changed since onset.The pain is present in the substernal region. The pain is mild. The quality of the pain is described as brief. The pain radiates to the left shoulder. Pertinent negatives include no abdominal pain. She has tried nothing for the symptoms. The treatment provided no relief.    Past Medical History:  Diagnosis Date  . Anxiety   . Breast cancer ([place])   . Breast cancer of lower-outer quadrant of left female breast ([place]) 10/11/2014  . GERD (gastroesophageal reflux disease)   . Hypertension   . Personal history of radiation therapy    2016  . PONV (postoperative nausea and vomiting)   . PUD (peptic ulcer disease)   . Stone, kidney     Patient Active Problem List   Diagnosis Date Noted  . Acute ST elevation myocardial infarction (STEMI) of anterolateral wall ([place]) 07/27/2017  . Heart attack ([place]) 07/27/2017  . Osteoporosis 10/20/2014  . Malignant neoplasm of lower-outer quadrant of left breast of female, estrogen receptor positive ([place]) 10/11/2014  . Pure hypercholesterolemia 04/08/2014  . Anxiety   . PUD (peptic ulcer disease)   . HTN (hypertension), benign 06/29/2011  . GERD (gastroesophageal reflux disease) 06/29/2011    Past Surgical History:  Procedure Laterality Date  . BREAST LUMPECTOMY WITH SENTINEL LYMPH NODE BIOPSY Left 10/22/2014   Procedure: BREAST LUMPECTOMY WITH SENTINEL LYMPH NODE BIOPSY;  Surgeon: Autumn Messing III, MD;  Location: [facility];  Service: General;  Laterality: Left;  . RE-EXCISION OF BREAST CANCER,SUPERIOR MARGINS Left 11/04/2014   Procedure: RE-EXCISION  OF LEFT BREAST INFERIOR MARGINS;  Surgeon: Autumn Messing III, MD;  Location: [facility];  Service: General;  Laterality: Left;    OB History    Gravida Para Term Preterm AB Living   5 4           SAB TAB Ectopic Multiple Live Births                  Obstetric Comments   Menses age 52, G5, P4,BC x 2-3 years, Menopause age 42 or 83 years old, HRT~ 3years           Home Medications    Prior to Admission medications   Medication Sig Start Date End Date Taking? Authorizing Provider  anastrozole (ARIMIDEX) 1 MG tablet TAKE ONE TABLET BY MOUTH ONCE DAILY 04/05/17  Yes Magrinat, Virgie Dad, MD  losartan (COZAAR) 100 MG tablet Take 1 tablet (100 mg total) by mouth daily. 07/13/17  Yes Shawnee Knapp, MD  Ascorbic Acid (VITAMIN C) 1000 MG tablet Take 1,000 mg by mouth daily.    [provider]  aspirin 81 MG tablet Take 81 mg by mouth 3 (three) times a week.     [provider]  Calcium Carb-Cholecalciferol (CALCIUM + D3) 600-800 MG-UNIT TABS Take by mouth.    [provider]  cholecalciferol (VITAMIN D) 1000 UNITS tablet Take 1,000 Units by mouth daily. Over the counter    [provider]  co-enzyme Q-10 30 MG capsule Take 30 mg by  mouth 3 (three) times daily.    [provider]  fish oil-omega-3 fatty acids 1000 MG capsule Take 1 g by mouth daily.    [provider]  GARLIC PO Take by mouth daily.    [provider]  magnesium gluconate (MAGONATE) 500 MG tablet Take 500 mg by mouth 2 (two) times daily.    [provider]  Multiple Vitamin (MULTIVITAMIN) tablet Take 1 tablet by mouth daily.    [provider]    Family History Family History  Problem Relation Age of Onset  . Pancreatic cancer Mother        pancreastic  . Stroke Father   . Hypertension Brother   . Hypertension Sister   . Hypertension Brother   . Heart disease Brother   . Hypertension Sister     Social History Social History   Tobacco Use  .  Smoking status: Never Smoker  . Smokeless tobacco: Never Used  Substance Use Topics  . Alcohol use: No  . Drug use: No     Allergies   Lisinopril   Review of Systems Review of Systems  Cardiovascular: Positive for chest pain.  Gastrointestinal: Negative for abdominal pain.  All other systems reviewed and are negative.    Physical Exam Updated Vital Signs BP 116/70   Pulse 65   Resp (!) 24   Ht _0  (1.727 m)   Wt 77.4 kg (170 lb 10.2 oz)   SpO2 100%   BMI 25.95 kg/m   Physical Exam  Constitutional: She is oriented to person, place, and time. She appears well-developed and well-nourished.  HENT:  Head: Normocephalic and atraumatic.  Eyes: Conjunctivae and EOM are normal.  Neck: Normal range of motion.  Cardiovascular: Normal rate and regular rhythm.  Pulmonary/Chest: No stridor. No respiratory distress.  Abdominal: Soft. She exhibits no distension.  Musculoskeletal: Normal range of motion. She exhibits no edema or deformity.  Neurological: She is alert and oriented to person, place, and time.  Skin: Skin is warm and dry.  Nursing note and vitals reviewed.    ED Treatments / Results  Labs (all labs ordered are listed, but only abnormal results are displayed) Labs Reviewed  CBC WITH DIFFERENTIAL/PLATELET - Abnormal; Notable for the following components:      Result Value   RBC 3.74 (*)    Hemoglobin 11.1 (*)    HCT 34.6 (*)    All other components within normal limits  COMPREHENSIVE METABOLIC PANEL - Abnormal; Notable for the following components:   CO2 19 (*)    Glucose, Bld 132 (*)    BUN 27 (*)    Creatinine, Ser 1.28 (*)    GFR calc non Af Amer 38 (*)    GFR calc Af Amer 45 (*)    Anion gap 20 (*)    All other components within normal limits  TROPONIN I - Abnormal; Notable for the following components:   Troponin I 0.03 (*)    All other components within normal limits  LIPID PANEL - Abnormal; Notable for the following components:   Cholesterol  254 (*)    Triglycerides 203 (*)    VLDL 41 (*)    LDL Cholesterol 158 (*)    All other components within normal limits  TROPONIN I - Abnormal; Notable for the following components:   Troponin I 56.25 (*)    All other components within normal limits  COMPREHENSIVE METABOLIC PANEL - Abnormal; Notable for the following components:   Potassium  3.4 (*)    CO2 20 (*)    Glucose, Bld 229 (*)    BUN 27 (*)    Creatinine, Ser 1.27 (*)    Total Protein 6.3 (*)    AST 431 (*)    ALT 222 (*)    GFR calc non Af Amer 39 (*)    GFR calc Af Amer 45 (*)    All other components within normal limits  CBC - Abnormal; Notable for the following components:   WBC 19.9 (*)    All other components within normal limits  COOXEMETRY PANEL - Abnormal; Notable for the following components:   Carboxyhemoglobin 0.4 (*)    All other components within normal limits  GLUCOSE, CAPILLARY - Abnormal; Notable for the following components:   Glucose-Capillary 145 (*)    All other components within normal limits  POCT I-STAT, CHEM 8 - Abnormal; Notable for the following components:   BUN 27 (*)    Creatinine, Ser 1.30 (*)    Glucose, Bld 132 (*)    Hemoglobin 11.9 (*)    HCT 35.0 (*)    All other components within normal limits  POCT I-STAT 3, ART BLOOD GAS (G3+) - Abnormal; Notable for the following components:   pH, Arterial 7.203 (*)    pCO2 arterial 56.7 (*)    Acid-base deficit 6.0 (*)    All other components within normal limits  POCT I-STAT, CHEM 8 - Abnormal; Notable for the following components:   Potassium 3.4 (*)    BUN 28 (*)    Creatinine, Ser 1.20 (*)    Glucose, Bld 230 (*)    All other components within normal limits  POCT I-STAT 3, ART BLOOD GAS (G3+) - Abnormal; Notable for the following components:   pH, Arterial 7.285 (*)    Bicarbonate 19.9 (*)    TCO2 21 (*)    Acid-base deficit 6.0 (*)    All other components within normal limits  MRSA PCR SCREENING  PROTIME-INR  APTT  LACTIC  ACID, PLASMA  TSH  MAGNESIUM  LACTIC ACID, PLASMA  COMPREHENSIVE METABOLIC PANEL  CBC WITH DIFFERENTIAL/PLATELET  BLOOD GAS, ARTERIAL  TROPONIN I  TROPONIN I  LIPID PANEL  HEMOGLOBIN A1C  MAGNESIUM  PHOSPHORUS  BLOOD GAS, ARTERIAL  POCT I-STAT TROPONIN I    EKG  EKG Interpretation  Date/Time:  Saturday July 27 2017 16:17:44 EST Ventricular Rate:  72 PR Interval:    QRS Duration: 120 QT Interval:  425 QTC Calculation: 466 R Axis:   18 Text Interpretation:  Sinus rhythm LVH with IVCD and secondary repol abnrm ST depr, consider ischemia, inferior leads ST elevation, consider lateral injury ** ** ACUTE MI / STEMI ** ** Confirmed by Merrily Pew [phone]) on 07/28/2017 12:41:03 AM       Radiology Dg Chest Port 1 View  Result Date: 07/27/2017 CLINICAL DATA:  Congestive heart failure. On ventilator. Central line placement. EXAM: PORTABLE CHEST 1 VIEW COMPARISON:  Prior today FINDINGS: A new left subclavian central venous catheter seen with tip overlying the distal SVC. No evidence of pneumothorax. Endotracheal tube remains in appropriate position. Mild improvement in diffuse bilateral airspace disease is consistent with decreased pulmonary edema. Heart size remains stable. IMPRESSION: New left subclavian central venous catheter in appropriate position. No pneumothorax visualized. Improvement in bilateral airspace disease, consistent with decreased pulmonary edema. Electronically Signed   By: Earle Gell M.D.   On: 07/27/2017 19:39   Dg Chest Port 1v Same Day  Result  Date: 07/27/2017 CLINICAL DATA:  Pulmonary edema and shortness of breath EXAM: PORTABLE CHEST 1 VIEW COMPARISON:  02/01/2011 chest radiograph FINDINGS: An endotracheal tube with tip 4.5 cm above the carina noted. Cardiomegaly identified. Diffuse bilateral airspace opacities likely represents edema. There may be trace bilateral pleural effusions present. There is no evidence of pneumothorax. IMPRESSION: Diffuse bilateral  airspace opacities likely representing pulmonary edema. Cardiomegaly. Electronically Signed   By: Margarette Canada M.D.   On: 07/27/2017 18:21   Dg Abd Portable 1v  Result Date: 07/27/2017 CLINICAL DATA:  OG tube placement. EXAM: PORTABLE ABDOMEN - 1 VIEW COMPARISON:  None. FINDINGS: An OG tube is noted with tip overlying the proximal-mid stomach. Side hole lies at the EG junction. The bowel gas pattern is unremarkable. LOWER lung airspace disease/consolidation again noted. IMPRESSION: OG tube with tip overlying the proximal-mid stomach with side hole at the EG junction. Recommend advancement. Electronically Signed   By: Margarette Canada M.D.   On: 07/27/2017 22:40    Procedures Procedures (including critical care time)  CRITICAL CARE Performed by: Merrily Pew Total critical care time: 35 minutes Critical care time was exclusive of separately billable procedures and treating other patients. Critical care was necessary to treat or prevent imminent or life-threatening deterioration. Critical care was time spent personally by me on the following activities: development of treatment plan with patient and/or surrogate as well as nursing, discussions with consultants, evaluation of patient's response to treatment, examination of patient, obtaining history from patient or surrogate, ordering and performing treatments and interventions, ordering and review of laboratory studies, ordering and review of radiographic studies, pulse oximetry and re-evaluation of patient's condition.  Medications Ordered in ED Medications  ondansetron (ZOFRAN) 4 MG/2ML injection (not administered)  anastrozole (ARIMIDEX) tablet 1 mg (not administered)  ondansetron (ZOFRAN) injection 4 mg (not administered)  enoxaparin (LOVENOX) injection 40 mg (not administered)  sodium chloride flush (NS) 0.9 % injection 3 mL (3 mLs Intravenous Not Given 07/27/17 2303)  sodium chloride flush (NS) 0.9 % injection 3 mL (not administered)  0.9 %   sodium chloride infusion (not administered)  sodium chloride flush (NS) 0.9 % injection 3 mL (10 mLs Intravenous Given 07/27/17 2237)  sodium chloride flush (NS) 0.9 % injection 3 mL (not administered)  0.9 %  sodium chloride infusion (not administered)  furosemide (LASIX) injection 40 mg (40 mg Intravenous Given 07/27/17 1821)  fentaNYL 2522mg in NS 2545m(1015mml) infusion-PREMIX (200 mcg/hr Intravenous Rate/Dose Change 07/27/17 2200)  pantoprazole (PROTONIX) injection 40 mg (40 mg Intravenous Given 07/27/17 2214)  fentaNYL (SUBLIMAZE) bolus via infusion 25 mcg (not administered)  midazolam (VERSED) injection 1 mg (1 mg Intravenous Given 07/27/17 2141)  midazolam (VERSED) injection 1 mg (not administered)  insulin aspart (novoLOG) injection 2-6 Units (2 Units Subcutaneous Given 07/28/17 0024)  nitroGLYCERIN 50 mg in dextrose 5 % 250 mL (0.2 mg/mL) infusion (0 mcg/min Intravenous Hold 07/27/17 2354)  potassium chloride 10 mEq in 50 mL *CENTRAL LINE* IVPB (10 mEq Intravenous New Bag/Given 07/28/17 0016)  acetaminophen (TYLENOL) solution 650 mg (not administered)  aspirin chewable tablet 81 mg (not administered)  atorvastatin (LIPITOR) tablet 80 mg (not administered)  carvedilol (COREG) tablet 6.25 mg (not administered)  magnesium gluconate (MAGONATE) tablet 500 mg (500 mg Per Tube Given 07/27/17 2327)  multivitamin liquid 15 mL (not administered)  omega-3 acid ethyl esters (LOVAZA) capsule 1 g (not administered)  ticagrelor (BRILINTA) tablet 90 mg (not administered)  vitamin C (ASCORBIC ACID) tablet 1,000 mg (not administered)  chlorhexidine  gluconate (MEDLINE KIT) (PERIDEX) 0.12 % solution 15 mL (15 mLs Mouth Rinse Not Given 07/27/17 2330)  MEDLINE mouth rinse (15 mLs Mouth Rinse Given 07/28/17 0025)  fentaNYL (SUBLIMAZE) injection 50 mcg (100 mcg Intravenous Given 07/27/17 1812)  0.9 %  sodium chloride infusion (100 mL/hr Intravenous New Bag/Given 07/27/17 1641)  0.9 %  sodium chloride infusion (999  mL/hr Intravenous New Bag/Given 07/27/17 1645)  heparin infusion 2 units/mL in 0.9 % sodium chloride (500 mLs Other New Bag/Given 07/27/17 1714)  furosemide (LASIX) injection 40 mg (40 mg Intravenous Given 07/27/17 2230)  fentaNYL (SUBLIMAZE) 100 MCG/2ML injection (  Duplicate 0/31/28 1188)  fentaNYL (SUBLIMAZE) injection 100 mcg (0 mcg Intravenous Duplicate 6/77/37 3668)  potassium chloride 20 MEQ/15ML (10%) solution 20 mEq (20 mEq Per Tube Given 07/27/17 2325)     Initial Impression / Assessment and Plan / ED Course  I have reviewed the triage vital signs and the nursing notes.  Pertinent labs & imaging results that were available during my care of the patient were reviewed by me and considered in my medical decision making (see chart for details).     STEMI. Stable. ASA given already. Cardiology at bedside. Will go to cath lab.  Final Clinical Impressions(s) / ED Diagnoses   Final diagnoses:  Pulmonary edema  CHF (congestive heart failure) ([place])  Ventilator dependent ([place])  Encounter for imaging study to confirm orogastric (OG) tube placement     Arjen Deringer, Corene Cornea, MD 07/28/17 [phone]

## 2017-07-28 NOTE — Progress Notes (Signed)
Progress Note  Patient Name: [name] Date of Encounter: 07/28/2017  Primary Cardiologist: No primary care provider on file. New  Subjective   Intubated, lateral STEMI post flash pulmonary edema asystolic cardiac arrest  Inpatient Medications    Scheduled Meds: . anastrozole  1 mg Oral Daily  . aspirin  81 mg Per Tube Daily  . atorvastatin  80 mg Per Tube q1800  . carvedilol  6.25 mg Per Tube BID WC  . chlorhexidine gluconate (MEDLINE KIT)  15 mL Mouth Rinse BID  . enoxaparin (LOVENOX) injection  40 mg Subcutaneous Q24H  . furosemide  40 mg Intravenous Q12H  . insulin aspart  2-6 Units Subcutaneous Q4H  . magnesium gluconate  500 mg Per Tube BID  . mouth rinse  15 mL Mouth Rinse 10 times per day  . multivitamin  15 mL Per Tube Daily  . omega-3 acid ethyl esters  1 g Per Tube Daily  . pantoprazole (PROTONIX) IV  40 mg Intravenous Q24H  . sodium chloride flush  3 mL Intravenous Q12H  . sodium chloride flush  3 mL Intravenous Q12H  . ticagrelor  90 mg Per Tube BID  . vitamin C  1,000 mg Per Tube Daily   Continuous Infusions: . sodium chloride 250 mL (07/27/17 2200)  . sodium chloride    . fentaNYL infusion INTRAVENOUS 100 mcg/hr (07/28/17 0833)  . nitroGLYCERIN Stopped (07/27/17 2354)   PRN Meds: sodium chloride, sodium chloride, acetaminophen, fentaNYL, midazolam, midazolam, ondansetron (ZOFRAN) IV, sodium chloride flush, sodium chloride flush   Vital Signs    Vitals:   07/28/17 0807 07/28/17 0821 07/28/17 0900 07/28/17 1000  BP:  (!) 149/75 (!) 155/78 (!) 152/80  Pulse:   83 84  Resp:   16 (!) 22  Temp:  98.3 F (36.8 C)    TempSrc:  Axillary    SpO2: 93%  95% 96%  Weight:      Height:        Intake/Output Summary (Last 24 hours) at 07/28/2017 1021 Last data filed at 07/28/2017 1000 Gross per 24 hour  Intake 958.34 ml  Output 1495 ml  Net -536.66 ml   Filed Weights   07/27/17 1800 07/27/17 2118 07/28/17 0500  Weight: 169 lb 5 oz (76.8 kg) 170 lb  10.2 oz (77.4 kg) 172 lb 13.5 oz (78.4 kg)    Telemetry    Multiple periods of ventricular tachycardia nonsustained, currently sinus rhythm- Personally Reviewed  ECG    07/28/17 at 7:15 AM -normal sinus rhythm, mild ST elevation in 1 and aVL, T wave inversion V4 through V6-ST segment have improved-personally Reviewed  Physical Exam   GEN: Intubated sedate Neck: No JVD Cardiac: RRR with occasional ectopy, no murmurs, rubs, or gallops.  Respiratory:  Vent noise bilaterally. GI: Soft, nontender, non-distended  MS: No edema; No deformity. Neuro:  Nonfocal  Psych: Normal affect   Labs    Chemistry Recent Labs  Lab 07/27/17 1626  07/27/17 1939 07/27/17 2036 07/28/17 0107  NA 141   < > 139 142 139  K 3.5   < > 3.4* 3.4* 4.8  CL 102   < > 107 106 110  CO2 19*  --  20*  --  18*  GLUCOSE 132*   < > 229* 230* 114*  BUN 27*   < > 27* 28* 28*  CREATININE 1.28*   < > 1.27* 1.20* 1.35*  CALCIUM 9.6  --  9.3  --  9.0  PROT 6.5  --  6.3*  --  6.1*  ALBUMIN 3.7  --  3.5  --  3.2*  AST 29  --  431*  --  521*  ALT 19  --  222*  --  211*  ALKPHOS 38  --  43  --  38  BILITOT 0.7  --  0.6  --  0.7  GFRNONAA 38*  --  39*  --  36*  GFRAA 45*  --  45*  --  42*  ANIONGAP 20*  --  12  --  11   < > = values in this interval not displayed.     Hematology Recent Labs  Lab 07/27/17 1626  07/27/17 1939 07/27/17 2036 07/28/17 0107  WBC 8.7  --  19.9*  --  14.0*  RBC 3.74*  --  4.13  --  4.22  HGB 11.1*   < > 12.4 15.0 12.6  HCT 34.6*   < > 38.6 44.0 38.8  MCV 92.5  --  93.5  --  91.9  MCH 29.7  --  30.0  --  29.9  MCHC 32.1  --  32.1  --  32.5  RDW 13.2  --  13.3  --  13.0  PLT 251  --  267  --  247   < > = values in this interval not displayed.    Cardiac Enzymes Recent Labs  Lab 07/27/17 1626 07/27/17 1907 07/28/17 0107  TROPONINI 0.03* 56.25* >65.00*    Recent Labs  Lab 07/27/17 1648  TROPIPOC 0.03     BNPNo results for input(s): BNP, PROBNP in the last 168 hours.     DDimer No results for input(s): DDIMER in the last 168 hours.   Radiology    Dg Chest Port 1 View  Result Date: 07/28/2017 CLINICAL DATA:  Followup ventilator support. EXAM: PORTABLE CHEST 1 VIEW COMPARISON:  07/27/2017 FINDINGS: Endotracheal tube tip is 4 cm above the carina. Nasogastric tube enters the abdomen. Left subclavian central line tip in the SVC just above the right atrium. Widespread bilateral pulmonary opacity persists. There may be enlarging pleural effusions. Otherwise, very similar appearance to yesterday. IMPRESSION: Lines and tubes well positioned. Persistent diffuse edema pattern. Question developing effusions. Electronically Signed   By: Nelson Chimes M.D.   On: 07/28/2017 06:37   Dg Chest Port 1 View  Result Date: 07/27/2017 CLINICAL DATA:  Congestive heart failure. On ventilator. Central line placement. EXAM: PORTABLE CHEST 1 VIEW COMPARISON:  Prior today FINDINGS: A new left subclavian central venous catheter seen with tip overlying the distal SVC. No evidence of pneumothorax. Endotracheal tube remains in appropriate position. Mild improvement in diffuse bilateral airspace disease is consistent with decreased pulmonary edema. Heart size remains stable. IMPRESSION: New left subclavian central venous catheter in appropriate position. No pneumothorax visualized. Improvement in bilateral airspace disease, consistent with decreased pulmonary edema. Electronically Signed   By: Earle Gell M.D.   On: 07/27/2017 19:39   Dg Chest Port 1v Same Day  Result Date: 07/27/2017 CLINICAL DATA:  Pulmonary edema and shortness of breath EXAM: PORTABLE CHEST 1 VIEW COMPARISON:  02/01/2011 chest radiograph FINDINGS: An endotracheal tube with tip 4.5 cm above the carina noted. Cardiomegaly identified. Diffuse bilateral airspace opacities likely represents edema. There may be trace bilateral pleural effusions present. There is no evidence of pneumothorax. IMPRESSION: Diffuse bilateral airspace  opacities likely representing pulmonary edema. Cardiomegaly. Electronically Signed   By: Margarette Canada M.D.   On: 07/27/2017 18:21   Dg Abd Portable 1v  Result Date: 07/27/2017 CLINICAL DATA:  OG tube placement. EXAM: PORTABLE ABDOMEN - 1 VIEW COMPARISON:  None. FINDINGS: An OG tube is noted with tip overlying the proximal-mid stomach. Side hole lies at the EG junction. The bowel gas pattern is unremarkable. LOWER lung airspace disease/consolidation again noted. IMPRESSION: OG tube with tip overlying the proximal-mid stomach with side hole at the EG junction. Recommend advancement. Electronically Signed   By: Margarette Canada M.D.   On: 07/27/2017 22:40    Cardiac Studies   Cardiac catheterization 07/27/17:  Post Atrio lesion is 90% stenosed.  Prox LAD to Dist LAD lesion is 50% stenosed.  Ost 1st Diag lesion is 95% stenosed.  Ost 2nd Diag to 2nd Diag lesion is 90% stenosed.  Mid Cx lesion is 30% stenosed.  Ost Ramus to Ramus lesion is 100% stenosed.  A drug-eluting stent was successfully placed using a STENT SYNERGY DES 2.75X28.  A drug-eluting stent was successfully placed using a STENT SYNERGY DES 2.5X12.  Post intervention, there is a 0% residual stenosis.  There is moderate to severe left ventricular systolic dysfunction.  LV end diastolic pressure is moderately elevated.  The left ventricular ejection fraction is 35-45% by visual estimate.   1. 3 vessel obstructive CAD    - 100% ramus intermediate artery. This is a large vessel and is the culprit lesion.    - 95% small first diagonal    - 90% small second diagonal     - diffusely diseased LAD    - 90% PLOM 2. Moderate to severe LV dysfunction. EF estimated at 35-40%. 3. Elevated LVEDP 4. Successful stenting of the ramus intermediate artery with DES x 2.   Plan; DAPT for one year. I would treat her residual disease medically. Will need IV lasix. Hold ARB and follow renal function closely. Resume if Renal function remains  stable. High dose statin therapy.  Patient Profile     83 y.o. female admitted with lateral ST elevation myocardial infarction, cardia genic shock, asystole, flash pulmonary edema resulting in hypoxic respiratory failure requiring acute intubation with three-vessel obstructive coronary artery disease (100% ramus intermedius-culprit lesion successfully stented with 2 DES), 95% small first diagonal branch, 90% small second diagonal branch, diffusely diseased LAD, 90% posterior lateral obtuse marginal branch with ischemic cardiomyopathy EF 35-40%, elevated left ventricular end-diastolic pressure.  Assessment & Plan    Lateral STEMI -Troponin 65, large infarct, successful ramus intermediate stenting x2 (second stent placed because of edge dissection).  Residual CAD as noted above and cardiac catheterization.  Dr. Martinique noted treating residual disease medically.  High intensity statin, beta-blocker, and ACE inhibitor or angiotensin receptor blocker when creatinine deemed stable, dual antiplatelet therapy.  Cardiogenic shock/ischemic cardia myopathy/acute systolic heart failure/ventricular tachycardia/transient asystole/ventricular tachycardia nonsustained continuing with - Flash pulmonary edema requiring intubation upon arrival to emergency department.  Appreciate PCCM. -Blood pressure currently 152/80 up from 80/50. -Giving her carvedilol 6.25 mg twice a day, Lasix 40 mg IV every 12 hours.  Output approximately 1 L thus far.  Holding ACE/arm with acute kidney injury.  Marked improvement in respiratory status since revascularization.  Beta-blocker, titrate as tolerated.  We went to decrease her volume further at this point. -Critical care, timing on extubation.  Perhaps later this afternoon.  Pre-diabetes - Sliding scale insulin.  Last hemoglobin A1c was 5.9 in early February  Critical care time 35 minutes spent with patient, data review, discussion with family, nursing care team.  Critical illness  with recent cardiogenic shock, STEMI, asystole,  respiratory failure.   For questions or updates, please contact [place] Please consult [email or web address] for contact info under Cardiology/STEMI.      Signed, Candee Furbish, MD  07/28/2017, 10:21 AM

## 2017-07-28 NOTE — Progress Notes (Signed)
Fentanyl drip (127ml) disposed of down the sink. Disposed by [facility] RN and witnessed by [employer].

## 2017-07-28 NOTE — Progress Notes (Signed)
EKG CRITICAL VALUE     12 lead EKG performed.  Critical value noted.  Marolyn Hammock, RN notified.   Genia Plants, CCT 07/28/2017 7:47 AM

## 2017-07-28 NOTE — Progress Notes (Signed)
  Echocardiogram 2D Echocardiogram has been performed.  [name] 07/28/2017, 3:38 PM

## 2017-07-28 NOTE — Procedures (Signed)
Extubation Procedure Note  Patient Details:   Name: [name] DOB: [birth year] MRN: [id number]   Airway Documentation:   + cuff leak prior to extubation.    Evaluation  O2 sats: stable throughout Complications: No apparent complications Patient did tolerate procedure well. BBSH w/ faint crackles on right.    Yes pt able to speak- whisper.  No stridor noted, no distress noted.   Initially extubated to 5 lpm [place], and discussed w/ pt and RN re: placing pt on bipap per order, however pt denies SOB, no increased WOB noted.  Pt states her breathing is "good".  BIPAP w/ mask standby in room and available if needed.   Lenna Sciara 07/28/2017, 4:25 PM

## 2017-07-29 ENCOUNTER — Encounter (HOSPITAL_COMMUNITY): Payer: Self-pay | Admitting: Cardiology

## 2017-07-29 ENCOUNTER — Inpatient Hospital Stay (HOSPITAL_COMMUNITY): Payer: Medicare Other

## 2017-07-29 DIAGNOSIS — I10 Essential (primary) hypertension: Secondary | ICD-10-CM

## 2017-07-29 DIAGNOSIS — J810 Acute pulmonary edema: Secondary | ICD-10-CM

## 2017-07-29 DIAGNOSIS — I5021 Acute systolic (congestive) heart failure: Secondary | ICD-10-CM

## 2017-07-29 DIAGNOSIS — I4891 Unspecified atrial fibrillation: Secondary | ICD-10-CM

## 2017-07-29 LAB — GLUCOSE, CAPILLARY
GLUCOSE-CAPILLARY: 208 mg/dL — AB (ref 65–99)
GLUCOSE-CAPILLARY: 153 mg/dL — AB (ref 65–99)
GLUCOSE-CAPILLARY: 119 mg/dL — AB (ref 65–99)
GLUCOSE-CAPILLARY: 159 mg/dL — AB (ref 65–99)
Glucose-Capillary: 102 mg/dL — ABNORMAL HIGH (ref 65–99)
Glucose-Capillary: 157 mg/dL — ABNORMAL HIGH (ref 65–99)

## 2017-07-29 LAB — COOXEMETRY PANEL
Carboxyhemoglobin: 1.7 % — ABNORMAL HIGH (ref 0.5–1.5)
Methemoglobin: 1 % (ref 0.0–1.5)
O2 SAT: 61.5 %
Total hemoglobin: 10.6 g/dL — ABNORMAL LOW (ref 12.0–16.0)

## 2017-07-29 LAB — BASIC METABOLIC PANEL
ANION GAP: 13 (ref 5–15)
BUN: 33 mg/dL — ABNORMAL HIGH (ref 6–20)
CHLORIDE: 104 mmol/L (ref 101–111)
CO2: 25 mmol/L (ref 22–32)
CREATININE: 1.53 mg/dL — AB (ref 0.44–1.00)
Calcium: 9.1 mg/dL (ref 8.9–10.3)
GFR calc non Af Amer: 31 mL/min — ABNORMAL LOW (ref >60)
GFR, EST AFRICAN AMERICAN: 36 mL/min — AB (ref >60)
Glucose, Bld: 139 mg/dL — ABNORMAL HIGH (ref 65–99)
Potassium: 3.4 mmol/L — ABNORMAL LOW (ref 3.5–5.1)
SODIUM: 142 mmol/L (ref 135–145)

## 2017-07-29 LAB — POCT ACTIVATED CLOTTING TIME: ACTIVATED CLOTTING TIME: 373 s

## 2017-07-29 LAB — POCT I-STAT, CHEM 8
BUN: 26 mg/dL — ABNORMAL HIGH (ref 6–20)
CALCIUM ION: 1.34 mmol/L (ref 1.15–1.40)
Chloride: 108 mmol/L (ref 101–111)
Creatinine, Ser: 1.2 mg/dL — ABNORMAL HIGH (ref 0.44–1.00)
GLUCOSE: 144 mg/dL — AB (ref 65–99)
HCT: 35 % — ABNORMAL LOW (ref 36.0–46.0)
HEMOGLOBIN: 11.9 g/dL — AB (ref 12.0–15.0)
POTASSIUM: 3.8 mmol/L (ref 3.5–5.1)
SODIUM: 143 mmol/L (ref 135–145)
TCO2: 22 mmol/L (ref 22–32)

## 2017-07-29 LAB — HEPARIN LEVEL (UNFRACTIONATED): HEPARIN UNFRACTIONATED: 0.61 [IU]/mL (ref 0.30–0.70)

## 2017-07-29 LAB — HEMOGLOBIN A1C
Hgb A1c MFr Bld: 5.8 % — ABNORMAL HIGH (ref 4.8–5.6)
MEAN PLASMA GLUCOSE: 120 mg/dL

## 2017-07-29 MED ORDER — FUROSEMIDE 10 MG/ML IJ SOLN
80.0000 mg | Freq: Two times a day (BID) | INTRAMUSCULAR | Status: DC
  Administered 2017-07-29: 80 mg via INTRAVENOUS
  Filled 2017-07-29: qty 8

## 2017-07-29 MED ORDER — HEPARIN (PORCINE) IN NACL 100-0.45 UNIT/ML-% IJ SOLN
950.0000 [IU]/h | INTRAMUSCULAR | Status: DC
  Administered 2017-07-29: 950 [IU]/h via INTRAVENOUS
  Filled 2017-07-29 (×2): qty 250

## 2017-07-29 MED ORDER — DILTIAZEM HCL-DEXTROSE 100-5 MG/100ML-% IV SOLN (PREMIX)
5.0000 mg/h | INTRAVENOUS | Status: DC
Start: 2017-07-29 — End: 2017-07-29
  Administered 2017-07-29: 5 mg/h via INTRAVENOUS
  Filled 2017-07-29: qty 100

## 2017-07-29 MED ORDER — ASPIRIN 81 MG PO CHEW
81.0000 mg | CHEWABLE_TABLET | Freq: Every day | ORAL | Status: DC
  Administered 2017-07-30 – 2017-08-07 (×9): 81 mg via ORAL
  Filled 2017-07-29 (×9): qty 1

## 2017-07-29 MED ORDER — ATORVASTATIN CALCIUM 80 MG PO TABS
80.0000 mg | ORAL_TABLET | Freq: Every day | ORAL | Status: DC
  Administered 2017-07-30 – 2017-08-06 (×8): 80 mg via ORAL
  Filled 2017-07-29 (×8): qty 1

## 2017-07-29 MED ORDER — AMIODARONE HCL IN DEXTROSE 360-4.14 MG/200ML-% IV SOLN
30.0000 mg/h | INTRAVENOUS | Status: AC
  Administered 2017-07-30 – 2017-08-05 (×11): 30 mg/h via INTRAVENOUS
  Filled 2017-07-29 (×14): qty 200

## 2017-07-29 MED ORDER — FUROSEMIDE 10 MG/ML IJ SOLN
80.0000 mg | Freq: Once | INTRAMUSCULAR | Status: AC
  Administered 2017-07-29: 80 mg via INTRAVENOUS
  Filled 2017-07-29: qty 8

## 2017-07-29 MED ORDER — DILTIAZEM HCL-DEXTROSE 100-5 MG/100ML-% IV SOLN (PREMIX)
5.0000 mg/h | INTRAVENOUS | Status: DC
  Administered 2017-07-29: 5 mg/h via INTRAVENOUS
  Filled 2017-07-29: qty 100

## 2017-07-29 MED ORDER — AMIODARONE HCL IN DEXTROSE 360-4.14 MG/200ML-% IV SOLN
60.0000 mg/h | INTRAVENOUS | Status: AC
  Administered 2017-07-29 (×2): 60 mg/h via INTRAVENOUS
  Filled 2017-07-29: qty 200

## 2017-07-29 MED ORDER — TICAGRELOR 90 MG PO TABS
90.0000 mg | ORAL_TABLET | Freq: Two times a day (BID) | ORAL | Status: DC
  Administered 2017-07-29 – 2017-07-31 (×4): 90 mg via ORAL
  Filled 2017-07-29 (×3): qty 1

## 2017-07-29 MED ORDER — AMIODARONE HCL IN DEXTROSE 360-4.14 MG/200ML-% IV SOLN: 30.0000 mg/h | INTRAVENOUS | Status: DC

## 2017-07-29 MED ORDER — ADULT MULTIVITAMIN LIQUID CH: 15.0000 mL | Freq: Every day | ORAL | Status: DC

## 2017-07-29 MED ORDER — POTASSIUM CHLORIDE CRYS ER 20 MEQ PO TBCR
40.0000 meq | EXTENDED_RELEASE_TABLET | Freq: Once | ORAL | Status: AC
  Administered 2017-07-29: 40 meq via ORAL
  Filled 2017-07-29: qty 2

## 2017-07-29 MED ORDER — AMIODARONE HCL IN DEXTROSE 360-4.14 MG/200ML-% IV SOLN
60.0000 mg/h | INTRAVENOUS | Status: DC
  Filled 2017-07-29: qty 200

## 2017-07-29 MED ORDER — ORAL CARE MOUTH RINSE
15.0000 mL | Freq: Two times a day (BID) | OROMUCOSAL | Status: DC
  Administered 2017-07-29 – 2017-08-01 (×5): 15 mL via OROMUCOSAL

## 2017-07-29 MED ORDER — MAGNESIUM GLUCONATE 500 MG PO TABS
500.0000 mg | ORAL_TABLET | Freq: Two times a day (BID) | ORAL | Status: DC
  Administered 2017-07-29 – 2017-08-07 (×18): 500 mg via ORAL
  Filled 2017-07-29 (×18): qty 1

## 2017-07-29 MED ORDER — AMIODARONE LOAD VIA INFUSION
150.0000 mg | Freq: Once | INTRAVENOUS | Status: DC
  Filled 2017-07-29: qty 83.34

## 2017-07-29 MED ORDER — OMEGA-3-ACID ETHYL ESTERS 1 G PO CAPS
1.0000 g | ORAL_CAPSULE | Freq: Every day | ORAL | Status: DC
  Administered 2017-07-30 – 2017-08-07 (×9): 1 g via ORAL
  Filled 2017-07-29 (×9): qty 1

## 2017-07-29 MED ORDER — METOLAZONE 2.5 MG PO TABS
2.5000 mg | ORAL_TABLET | Freq: Once | ORAL | Status: AC
  Administered 2017-07-29: 2.5 mg via ORAL
  Filled 2017-07-29: qty 1

## 2017-07-29 MED ORDER — VITAMIN C 500 MG PO TABS
1000.0000 mg | ORAL_TABLET | Freq: Every day | ORAL | Status: DC
  Administered 2017-07-30 – 2017-08-07 (×9): 1000 mg via ORAL
  Filled 2017-07-29 (×9): qty 2

## 2017-07-29 MED FILL — Heparin Sodium (Porcine) 2 Unit/ML in Sodium Chloride 0.9%: INTRAMUSCULAR | Qty: 1000 | Status: AC

## 2017-07-29 NOTE — Plan of Care (Signed)
Patient knowledge base in general as well as disease specific information is improving. She and her family are asking good questions as well as seeking ways to help improve general routines to enhance the recovery effort. Patient is tolerating her diet at this time and it will be advanced when appropriate.

## 2017-07-29 NOTE — Progress Notes (Signed)
EKG CRITICAL VALUE     12 lead EKG performed.  Critical value noted. Veneda Melter, RN notified.   Delfin Edis, [place] 07/29/2017 10:22 AM

## 2017-07-29 NOTE — Progress Notes (Signed)
Dr. Raiford Simmonds notified about patients EKG of A.Fib RVR at 133. Physician stated to notified oncoming shift because he only has 9 minutes remaining in his shift.

## 2017-07-29 NOTE — Progress Notes (Signed)
ANTICOAGULATION CONSULT NOTE - Follow-Up  Pharmacy Consult for heparin Indication: atrial fibrillation  Allergies  Allergen Reactions  . Lisinopril Cough    Patient Measurements: Height: 5\' 8"  (172.7 cm) Weight: 172 lb 6.4 oz (78.2 kg) IBW/kg (Calculated) : 63.9 Heparin Dosing Weight: 76kg  Vital Signs: Temp: 97.7 F (36.5 C) (02/25 1900) Temp Source: Oral (02/25 1900) BP: 109/67 (02/25 2000) Pulse Rate: 89 (02/25 2000)  Labs: Recent Labs    07/27/17 1626  07/27/17 1907 07/27/17 1939 07/27/17 2036 07/28/17 0107 07/28/17 1427 07/29/17 1609 07/29/17 2000  HGB 11.1*   < >  --  12.4 15.0 12.6  --   --   --   HCT 34.6*   < >  --  38.6 44.0 38.8  --   --   --   PLT 251  --   --  267  --  247  --   --   --   APTT 29  --   --   --   --   --   --   --   --   LABPROT 13.5  --   --   --   --   --   --   --   --   INR 1.04  --   --   --   --   --   --   --   --   HEPARINUNFRC  --   --   --   --   --   --   --   --  0.61  CREATININE 1.28*   < >  --  1.27* 1.20* 1.35*  --  1.53*  --   TROPONINI 0.03*  --  56.25*  --   --  >65.00* >65.00*  --   --    < > = values in this interval not displayed.    Estimated Creatinine Clearance: 32.2 mL/min (A) (by C-G formula based on SCr of 1.53 mg/dL (H)).   Medical History: Past Medical History:  Diagnosis Date  . Anxiety   . Breast cancer ([place])   . Breast cancer of lower-outer quadrant of left female breast ([place]) 10/11/2014  . GERD (gastroesophageal reflux disease)   . Hypertension   . Personal history of radiation therapy    2016  . PONV (postoperative nausea and vomiting)   . PUD (peptic ulcer disease)   . Stone, kidney    Assessment: 83 year old female admitted as a STEMI 07/27/17, now situation complicated by afib with rvr overnight. New orders to start heparin.   Patient received sq lovenox around 8am this morning and A-line was pull around 10am. Heparin start delayed until 1200 and started without a bolus.   Heparin level  this evening is therapeutic (Hl 0.61, goal of 0.3-0.7). No bleeding noted at this time  Goal of Therapy:  Heparin level 0.3-0.7 units/ml Monitor platelets by anticoagulation protocol: Yes   Plan:  1. Continue Heparin at 950 units/hr (9.5 ml/hr) 2. Will continue to monitor for any signs/symptoms of bleeding and will follow up with heparin level in the a.m to confirm therapeutic  Thank you for allowing pharmacy to be a part of this patient's care.  Alycia Rossetti, PharmD, BCPS Clinical Pharmacist Pager: [phone] Clinical phone for 07/29/2017: [phone] 07/29/2017 9:19 PM

## 2017-07-29 NOTE — Progress Notes (Signed)
ANTICOAGULATION CONSULT NOTE - Initial Consult  Pharmacy Consult for heparin Indication: atrial fibrillation  Allergies  Allergen Reactions  . Lisinopril Cough    Patient Measurements: Height: 5\' 8"  (172.7 cm) Weight: 172 lb 6.4 oz (78.2 kg) IBW/kg (Calculated) : 63.9 Heparin Dosing Weight: 76kg  Vital Signs: Temp: 97.7 F (36.5 C) (02/25 0900) Temp Source: Oral (02/25 0300) BP: 109/51 (02/25 0900) Pulse Rate: 91 (02/25 0900)  Labs: Recent Labs    07/27/17 1626  07/27/17 1907 07/27/17 1939 07/27/17 2036 07/28/17 0107 07/28/17 1427  HGB 11.1*   < >  --  12.4 15.0 12.6  --   HCT 34.6*   < >  --  38.6 44.0 38.8  --   PLT 251  --   --  267  --  247  --   APTT 29  --   --   --   --   --   --   LABPROT 13.5  --   --   --   --   --   --   INR 1.04  --   --   --   --   --   --   CREATININE 1.28*   < >  --  1.27* 1.20* 1.35*  --   TROPONINI 0.03*  --  56.25*  --   --  >65.00* >65.00*   < > = values in this interval not displayed.    Estimated Creatinine Clearance: 36.5 mL/min (A) (by C-G formula based on SCr of 1.35 mg/dL (H)).   Medical History: Past Medical History:  Diagnosis Date  . Anxiety   . Breast cancer ([place])   . Breast cancer of lower-outer quadrant of left female breast ([place]) 10/11/2014  . GERD (gastroesophageal reflux disease)   . Hypertension   . Personal history of radiation therapy    2016  . PONV (postoperative nausea and vomiting)   . PUD (peptic ulcer disease)   . Stone, kidney    Assessment: 83 year old female admitted as a STEMI 07/27/17, now situation complicated by afib with rvr overnight. New orders to start heparin.   Patient received sq lovenox around 8am this morning and A-line was pull around 10am. Will delay starting heparin until 12 noon, check level tonight. Will not bolus at this time.  May need to reconsider brilinta and switch to clopidogrel if triple therapy is needed.   Goal of Therapy:  Heparin level 0.3-0.7 units/ml Monitor  platelets by anticoagulation protocol: Yes   Plan:  Start heparin infusion at 950 units/hr Check anti-Xa level in 8 hours and daily while on heparin Continue to monitor H&H and platelets  Erin Hearing PharmD., BCPS Clinical Pharmacist 07/29/2017 10:21 AM

## 2017-07-29 NOTE — Progress Notes (Signed)
Per insurance check on Brilinta  # 5. S/W JESSICA @ [employer] [facility] # [phone]    BRILINTA 90 MG BID  COVER- YES  CO-PAY- $ 45.00  TIER- 3 DRUG  PRIOR APPROVAL- NO   PREFERRED PHARMACY : [facility] AND [facility]

## 2017-07-29 NOTE — Progress Notes (Signed)
Patient c/o being very tired, RR rate and effort have increased and lung sounds are now more coarse sounding. Rn called RT for assistance, also had RT student ask Dr. Elsworth Soho to come reassess patient. Placing patient back on bipap and Dr. Elsworth Soho is speaking with Dr. Irish Lack.

## 2017-07-29 NOTE — Progress Notes (Signed)
Patient requested to use bedside commode. Transfer to and from went well but patient became bradycardic immediately after. She has complaints of feeling very tired and worn out. Patient has returned to previous heart rate (60-80's). She is resting quietly, blood pressure is 120/56 (70).

## 2017-07-29 NOTE — Progress Notes (Signed)
PULMONARY / CRITICAL CARE MEDICINE   Name: [name] MRN: [id number] DOB: 1935-04-25    ADMISSION DATE:  07/27/2017 CONSULTATION DATE:  07/27/2017  REFERRING MD:  [place], P  CHIEF COMPLAINT:  S/P bradycardic arrest  HISTORY OF PRESENT ILLNESS:    83 year old  presented to the emergency room with chest pain and acute anterior lateral MI.  She was taken to the cardiac catheterization laboratory where she was found to have 3 vessels coronary disease.  A 100% ramus occlusion was felt to be the culprit vessel this was opened and 2 drug-eluting stents were placed.  On arrival in the coronary intensive care unit the patient suffered from a bradycardic arrest requiring atropine epinephrine and brief CPR for resuscitation.     SUBJECTIVE:   Extubated 2/24 Required bipap overnight & HFNC this am WOB ok, able to speak in full sentences Diuresing well with lasix Af-RVR overnight , started onc ardizem gtt  SUBJECTIVE:  Denies CP Dyspnea improving  VITAL SIGNS: BP (!) 109/51   Pulse 91   Temp 97.7 F (36.5 C)   Resp (!) 31   Ht 5\' 8"  (1.727 m)   Wt 172 lb 6.4 oz (78.2 kg)   SpO2 97%   BMI 26.21 kg/m   HEMODYNAMICS:    VENTILATOR SETTINGS: Vent Mode: BIPAP;PCV FiO2 (%):  [40 %] 40 % Set Rate:  [12 bmp] 12 bmp PEEP:  [5 cmH20-6 cmH20] 6 cmH20 Pressure Support:  [5 cmH20-6 cmH20] 6 cmH20  INTAKE / OUTPUT: I/O last 3 completed shifts: In: 1337.2 [P.O.:300; I.V.:837.2; IV Piggyback:200] Out: 3380 [Urine:3380]  PHYSICAL EXAMINATION: General: elderly, no acute distress  Neuro: awake, alert  Following commands in all 4 extremities.  Cardiovascular: S1 and S2 are regular without murmur rub or gallop.    Lungs: no acc muscles  There is symmetric air movement, clear anteriorly Abdomen: The abdomen is soft without any organomegaly masses tenderness guarding or rebound.   Musculoskeletal:good pulses  All of the extremities are warm  LABS:  BMET Recent Labs  Lab  07/27/17 1626  07/27/17 1939 07/27/17 2036 07/28/17 0107  NA 141   < > 139 142 139  K 3.5   < > 3.4* 3.4* 4.8  CL 102   < > 107 106 110  CO2 19*  --  20*  --  18*  BUN 27*   < > 27* 28* 28*  CREATININE 1.28*   < > 1.27* 1.20* 1.35*  GLUCOSE 132*   < > 229* 230* 114*   < > = values in this interval not displayed.    Electrolytes Recent Labs  Lab 07/27/17 1626 07/27/17 1939 07/28/17 0107 07/28/17 1427  CALCIUM 9.6 9.3 9.0  --   MG  --  2.1  --  2.3  PHOS  --   --   --  4.7*    CBC Recent Labs  Lab 07/27/17 1626  07/27/17 1939 07/27/17 2036 07/28/17 0107  WBC 8.7  --  19.9*  --  14.0*  HGB 11.1*   < > 12.4 15.0 12.6  HCT 34.6*   < > 38.6 44.0 38.8  PLT 251  --  267  --  247   < > = values in this interval not displayed.    Coag's Recent Labs  Lab 07/27/17 1626  APTT 29  INR 1.04    Sepsis Markers Recent Labs  Lab 07/27/17 1850  LATICACIDVEN 1.4    ABG Recent Labs  Lab 07/27/17 2020 07/27/17  2214 07/28/17 0421  PHART 7.203* 7.285* 7.380  PCO2ART 56.7* 42.0 34.7  PO2ART 107.0 108.0 102.0    Liver Enzymes Recent Labs  Lab 07/27/17 1626 07/27/17 1939 07/28/17 0107  AST 29 431* 521*  ALT 19 222* 211*  ALKPHOS 38 43 38  BILITOT 0.7 0.6 0.7  ALBUMIN 3.7 3.5 3.2*    Cardiac Enzymes Recent Labs  Lab 07/27/17 1907 07/28/17 0107 07/28/17 1427  TROPONINI 56.25* >65.00* >65.00*    Glucose Recent Labs  Lab 07/28/17 1240 07/28/17 1616 07/28/17 2027 07/28/17 2329 07/29/17 0324 07/29/17 0918  GLUCAP 135* 129* 80 103* 102* 157*    Imaging No results found.   DISCUSSION:      This is an 83 year old with "prediabetes", who presented with chest pain and acute anterior lateral MI.  She underwent placement of 2 drug-eluting stents in the ramus.  She subsequently suffered from a bradycardic arrest and has a chest x-ray consistent with flash pulmonary edema and cool extremities consistent with cardiogenic shock  ASSESSMENT /  PLAN:  PULMONARY A: Acute hypoxic resp failure - extubated -ct HFNC -BiPAP only if WOB increased  CARDIOVASCULAR A: Asystole Arrest Cardiogenic shock, now resolved - CAD, lateral STEMI -Acute flash pulmonary edema New AF-RVR  - per cards - cardizem gtt per parameters  DM vs pre-diabetes - SSI  GI prophylaxis is with Protonix and DVT prophylaxis with subcu insulin.   Updated daughter at bedside   Kara Mead MD. Shade Flood. [facility] Pager [phone] If no response call [phone]    07/29/2017, 10:56 AM

## 2017-07-29 NOTE — Progress Notes (Signed)
Notified Dr. Oletta Darter of patients new A.Fib RVR in the 130's.  I informed in that I notified cards fellow. Dr. Oletta Darter gave an order for Cardizem gtt at this time. Will continue to monitor.

## 2017-07-29 NOTE — Progress Notes (Signed)
I spoke with RT about patient possibly needing to be on the BIPAP for the night. Patient was sating 89-93% on 3L [place].  Patient placed on the BIPAP and is now sating around 96% on 40% FIO2.  Patient tolerating BIPAP well.  Patients daughter is at bedside. Will continue to monitor patient and follow physicians orders.

## 2017-07-29 NOTE — Consult Note (Addendum)
Advanced Heart Failure Team Consult Note   Primary Physician: Shawnee Knapp, MD PCP-Cardiologist:  No primary care provider on file.  Reason for Consultation: Cardiogenic shock/Flash pulmonary edema    HPI:    [name] is seen today for evaluation of Cardiogenic shock/flash pulmonary edema shock at the request of Dr.Varanasi.   [name] is a 83 y.o. female with h/o HTN, osteoporosis, L breast cancer s/p XRT and chemo and definitive surgery, HLD, and pre diabetes.    She presented to ED 07/27/17 with CP. EKG with acute anterior MI and taken to cath lab. [place] 07/27/17 as below with 3v CAD, LVEF 35-40%, elevated LVEDP. Underwent PCI with successful stenting of the ramus intermediate artery with DES x 2.   Pertinent labs on admission include Cr 1.28, K 3.5. Initial troponin 0.03 -> 56.25 -> 65 post intervention  On arrival to CVICU, patient suffered a bradycardic arrest requiring atropine and epi, with brief CPR prior to ROSC.   Pt extubated evening of 07/28/17. On BiPAP overnight, weaned to HFNC this am.  Per RN, was doing well until patient needed to have a BM.  Got to bedside commode fine, defecated, and got back in bed. Once sitting back in bed, became bradycardic into 30s and crackles noted on lung exam with worsening SOB.  Placed back on BiPAP.  Stat CXR showed widespread interstitial and alveolar opacity likely at least in part due to edema. (persistent from CXR 07/28/17)  Noted to also have new Afib with RVR overnight. Started on diltiazem by CCM for rate control.   Pt currently on BiPAP. Gestures that her breathing is getting better. Wants mask off. Denies CP.   Negative 1.5 L. Weight unchanged.  No labs performed this am.    Echo 07/28/17 LVEF 40-45%, Mild AI, Mod MR, Mod LAE, Mod TR, PA peak pressure 65 mm Hg.  Thick appearing ventricle   [facility] 07/27/17  -     Post Atrio lesion is 90% stenosed.  Prox LAD to Dist LAD lesion is 50% stenosed.  Ost 1st Diag lesion is 95%  stenosed.  Ost 2nd Diag to 2nd Diag lesion is 90% stenosed.  Mid Cx lesion is 30% stenosed.  Ost Ramus to Ramus lesion is 100% stenosed.  A drug-eluting stent was successfully placed using a STENT SYNERGY DES 2.75X28.  A drug-eluting stent was successfully placed using a STENT SYNERGY DES 2.5X12.  Post intervention, there is a 0% residual stenosis.  There is moderate to severe left ventricular systolic dysfunction.  LV end diastolic pressure is moderately elevated. The left ventricular ejection fraction is 35-45% by visual estimate.  Review of Systems: [y] = yes, '[ ]'$  = no   General: Weight gain '[ ]'$ ; Weight loss '[ ]'$ ; Anorexia '[ ]'$ ; Fatigue '[ ]'$ ; Fever '[ ]'$ ; Chills '[ ]'$ ; Weakness '[ ]'$   Cardiac: Chest pain/pressure '[ ]'$ ; Resting SOB [y]; Exertional SOB [y]; Orthopnea '[ ]'$ ; Pedal Edema [y]; Palpitations '[ ]'$ ; Syncope '[ ]'$ ; Presyncope '[ ]'$ ; Paroxysmal nocturnal dyspnea'[ ]'$   Pulmonary: Cough [y]; Wheezing'[ ]'$ ; Hemoptysis'[ ]'$ ; Sputum '[ ]'$ ; Snoring '[ ]'$   GI: Vomiting'[ ]'$ ; Dysphagia'[ ]'$ ; Melena'[ ]'$ ; Hematochezia '[ ]'$ ; Heartburn'[ ]'$ ; Abdominal pain '[ ]'$ ; Constipation '[ ]'$ ; Diarrhea '[ ]'$ ; BRBPR '[ ]'$   GU: Hematuria'[ ]'$ ; Dysuria '[ ]'$ ; Nocturia'[ ]'$   Vascular: Pain in legs with walking '[ ]'$ ; Pain in feet with lying flat '[ ]'$ ; Non-healing sores '[ ]'$ ; Stroke '[ ]'$ ; TIA '[ ]'$ ; Slurred speech '[ ]'$ ;  Neuro: Headaches'[ ]'$ ; Vertigo'[ ]'$ ; Seizures'[ ]'$ ; Paresthesias'[ ]'$ ;Blurred vision '[ ]'$ ; Diplopia '[ ]'$ ; Vision changes '[ ]'$   Ortho/Skin: Arthritis [y]; Joint pain [y]; Muscle pain '[ ]'$ ; Joint swelling '[ ]'$ ; Back Pain '[ ]'$ ; Rash '[ ]'$   Psych: Depression'[ ]'$ ; Anxiety'[ ]'$   Heme: Bleeding problems '[ ]'$ ; Clotting disorders '[ ]'$ ; Anemia '[ ]'$   Endocrine: Diabetes '[ ]'$ ; Thyroid dysfunction'[ ]'$   Home Medications Prior to Admission medications   Medication Sig Start Date End Date Taking? Authorizing Provider  anastrozole (ARIMIDEX) 1 MG tablet TAKE ONE TABLET BY MOUTH ONCE DAILY 04/05/17  Yes Magrinat, Virgie Dad, MD  losartan (COZAAR) 100 MG tablet Take 1 tablet (100 mg  total) by mouth daily. 07/13/17  Yes Shawnee Knapp, MD  Ascorbic Acid (VITAMIN C) 1000 MG tablet Take 1,000 mg by mouth daily.    [provider]  aspirin 81 MG tablet Take 81 mg by mouth 3 (three) times a week.     [provider]  Calcium Carb-Cholecalciferol (CALCIUM + D3) 600-800 MG-UNIT TABS Take by mouth.    [provider]  cholecalciferol (VITAMIN D) 1000 UNITS tablet Take 1,000 Units by mouth daily. Over the counter    [provider]  co-enzyme Q-10 30 MG capsule Take 30 mg by mouth 3 (three) times daily.    [provider]  fish oil-omega-3 fatty acids 1000 MG capsule Take 1 g by mouth daily.    [provider]  GARLIC PO Take by mouth daily.    [provider]  magnesium gluconate (MAGONATE) 500 MG tablet Take 500 mg by mouth 2 (two) times daily.    [provider]  Multiple Vitamin (MULTIVITAMIN) tablet Take 1 tablet by mouth daily.    [provider]    Past Medical History: Past Medical History:  Diagnosis Date  . Anxiety   . Breast cancer ([place])   . Breast cancer of lower-outer quadrant of left female breast ([place]) 10/11/2014  . GERD (gastroesophageal reflux disease)   . Hypertension   . Personal history of radiation therapy    2016  . PONV (postoperative nausea and vomiting)   . PUD (peptic ulcer disease)   . Stone, kidney     Past Surgical History: Past Surgical History:  Procedure Laterality Date  . BREAST LUMPECTOMY WITH SENTINEL LYMPH NODE BIOPSY Left 10/22/2014   Procedure: BREAST LUMPECTOMY WITH SENTINEL LYMPH NODE BIOPSY;  Surgeon: Autumn Messing III, MD;  Location: [facility];  Service: General;  Laterality: Left;  . CORONARY STENT INTERVENTION N/A 07/27/2017   Procedure: CORONARY STENT INTERVENTION;  Surgeon: Martinique, Peter M, MD;  Location: [facility] CV [facility];  Service: Cardiovascular;  Laterality: N/A;  . CORONARY/GRAFT ACUTE MI REVASCULARIZATION N/A 07/27/2017   Procedure:  Coronary/Graft Acute MI Revascularization;  Surgeon: Martinique, Peter M, MD;  Location: [facility];  Service: Cardiovascular;  Laterality: N/A;  . LEFT HEART CATH AND CORONARY ANGIOGRAPHY N/A 07/27/2017   Procedure: LEFT HEART CATH AND CORONARY ANGIOGRAPHY;  Surgeon: Martinique, Peter M, MD;  Location: [facility];  Service: Cardiovascular;  Laterality: N/A;  . RE-EXCISION OF BREAST CANCER,SUPERIOR MARGINS Left 11/04/2014   Procedure: RE-EXCISION OF LEFT BREAST INFERIOR MARGINS;  Surgeon: Autumn Messing III, MD;  Location: [facility];  Service: General;  Laterality: Left;    Family History: Family History  Problem Relation Age of Onset  . Pancreatic cancer Mother        pancreastic  . Stroke Father   .  Hypertension Brother   . Hypertension Sister   . Hypertension Brother   . Heart disease Brother   . Hypertension Sister     Social History: Social History   Socioeconomic History  . Marital status: Married    Spouse name: None  . Number of children: None  . Years of education: None  . Highest education level: None  Social Needs  . Financial resource strain: None  . Food insecurity - worry: None  . Food insecurity - inability: None  . Transportation needs - medical: None  . Transportation needs - non-medical: None  Occupational History  . Occupation: retired  Tobacco Use  . Smoking status: Never Smoker  . Smokeless tobacco: Never Used  Substance and Sexual Activity  . Alcohol use: No  . Drug use: No  . Sexual activity: None  Other Topics Concern  . None  Social History Narrative  . None    Allergies:  Allergies  Allergen Reactions  . Lisinopril Cough    Objective:    Vital Signs:   Temp:  [97.7 F (36.5 C)-98.3 F (36.8 C)] 98.2 F (36.8 C) (02/25 1124) Pulse Rate:  [63-162] 74 (02/25 1300) Resp:  [20-38] 31 (02/25 1300) BP: (90-152)/(51-114) 120/55 (02/25 1300) SpO2:  [91 %-100 %] 98 % (02/25 1300) Arterial Line BP: (111-172)/(46-86) 111/56 (02/25  0900) FiO2 (%):  [40 %] 40 % (02/25 1229) Weight:  [172 lb 6.4 oz (78.2 kg)] 172 lb 6.4 oz (78.2 kg) (02/25 0500) Last BM Date: 07/27/17(patient stated)  Weight change: Filed Weights   07/27/17 2118 07/28/17 0500 07/29/17 0500  Weight: 170 lb 10.2 oz (77.4 kg) 172 lb 13.5 oz (78.4 kg) 172 lb 6.4 oz (78.2 kg)    Intake/Output:   Intake/Output Summary (Last 24 hours) at 07/29/2017 1316 Last data filed at 07/29/2017 1300 Gross per 24 hour  Intake 484.5 ml  Output 2735 ml  Net -2250.5 ml      Physical Exam    General:  Fatigued. BiPAP in place.  HEENT: normal Neck: Supple. JVP 10-11 cm. Carotids 2+ bilat; no bruits. No lymphadenopathy or thyromegaly appreciated. Cor: PMI nondisplaced. Irregularly, irregular. No rubs, gallops or murmurs. Lungs: Diminished throughout Abdomen: soft, nontender, nondistended. No hepatosplenomegaly. No bruits or masses. Good bowel sounds. Extremities: no cyanosis, clubbing, or rash. Trace ankle edema. Slightly cool to the touch.  Neuro: alert & orientedx3, cranial nerves grossly intact. moves all 4 extremities w/o difficulty. BiPAP in place.  Affect flat but appropriate.   Telemetry   Remains in Afib 70-80s, personally reviewed.   EKG    EKG this am with Afib with competing junctional pacemakers. Personally reviewed.   Labs   Basic Metabolic Panel: Recent Labs  Lab 07/27/17 1626 07/27/17 1649 07/27/17 1939 07/27/17 2036 07/28/17 0107 07/28/17 1427  NA 141 141 139 142 139  --   K 3.5 3.9 3.4* 3.4* 4.8  --   CL 102 109 107 106 110  --   CO2 19*  --  20*  --  18*  --   GLUCOSE 132* 132* 229* 230* 114*  --   BUN 27* 27* 27* 28* 28*  --   CREATININE 1.28* 1.30* 1.27* 1.20* 1.35*  --   CALCIUM 9.6  --  9.3  --  9.0  --   MG  --   --  2.1  --   --  2.3  PHOS  --   --   --   --   --  4.7*    Liver Function Tests: Recent Labs  Lab 07/27/17 1626 07/27/17 1939 07/28/17 0107  AST 29 431* 521*  ALT 19 222* 211*  ALKPHOS 38 43 38   BILITOT 0.7 0.6 0.7  PROT 6.5 6.3* 6.1*  ALBUMIN 3.7 3.5 3.2*   No results for input(s): LIPASE, AMYLASE in the last 168 hours. No results for input(s): AMMONIA in the last 168 hours.  CBC: Recent Labs  Lab 07/27/17 1626 07/27/17 1649 07/27/17 1939 07/27/17 2036 07/28/17 0107  WBC 8.7  --  19.9*  --  14.0*  NEUTROABS 5.1  --   --   --  12.6*  HGB 11.1* 11.9* 12.4 15.0 12.6  HCT 34.6* 35.0* 38.6 44.0 38.8  MCV 92.5  --  93.5  --  91.9  PLT 251  --  267  --  247    Cardiac Enzymes: Recent Labs  Lab 07/27/17 1626 07/27/17 1907 07/28/17 0107 07/28/17 1427  TROPONINI 0.03* 56.25* >65.00* >65.00*    BNP: BNP (last 3 results) No results for input(s): BNP in the last 8760 hours.  ProBNP (last 3 results) No results for input(s): PROBNP in the last 8760 hours.   CBG: Recent Labs  Lab 07/28/17 2027 07/28/17 2329 07/29/17 0324 07/29/17 0918 07/29/17 1207  GLUCAP 80 103* 102* 157* 153*    Coagulation Studies: Recent Labs    07/27/17 1626  LABPROT 13.5  INR 1.04     Imaging   Dg Chest Port 1 View  Result Date: 07/29/2017 CLINICAL DATA:  Status postextubation. History of breast carcinoma. Atrial fibrillation EXAM: PORTABLE CHEST 1 VIEW COMPARISON:  July 28, 2017 FINDINGS: Endotracheal tube and nasogastric tube have been removed. Central catheter tip is in the superior vena cava. No pneumothorax. Widespread interstitial and alveolar opacity remain. There is a right pleural effusion. There is a presumed calcified granuloma in the right base. There is cardiomegaly with pulmonary venous hypertension. There is aortic atherosclerosis. No adenopathy. No bone lesions. IMPRESSION: No pneumothorax. Cardiomegaly with widespread interstitial and alveolar opacity likely at least in part due to edema. Small right pleural effusion. Suspect congestive heart failure. There may well be pneumonia superimposed, particularly the lung bases. Presumed granuloma right base.  There is  aortic atherosclerosis. Aortic Atherosclerosis (ICD10-I70.0). Electronically Signed   By: Lowella Grip III M.D.   On: 07/29/2017 11:35      Medications:     Current Medications: . anastrozole  1 mg Oral Daily  . aspirin  81 mg Per Tube Daily  . atorvastatin  80 mg Per Tube q1800  . carvedilol  6.25 mg Per Tube BID WC  . chlorhexidine gluconate (MEDLINE KIT)  15 mL Mouth Rinse BID  . furosemide  40 mg Intravenous Q12H  . insulin aspart  2-6 Units Subcutaneous Q4H  . magnesium gluconate  500 mg Per Tube BID  . mouth rinse  15 mL Mouth Rinse 10 times per day  . multivitamin  15 mL Per Tube Daily  . omega-3 acid ethyl esters  1 g Per Tube Daily  . pantoprazole (PROTONIX) IV  40 mg Intravenous Q24H  . sodium chloride flush  3 mL Intravenous Q12H  . sodium chloride flush  3 mL Intravenous Q12H  . ticagrelor  90 mg Per Tube BID  . vitamin C  1,000 mg Per Tube Daily     Infusions: . sodium chloride 250 mL (07/29/17 0700)  . sodium chloride    . diltiazem (CARDIZEM) infusion 5 mg/hr (07/29/17 1300)  .  heparin 950 Units/hr (07/29/17 1215)       Patient Profile   [name] is a 83 y.o. female with h/o HTN, osteoporosis, L breast cancer s/p XRT and chemo and definitive surgery, HLD, and pre diabetes.    She presented to ED 07/27/17 with CP. Found to have acute anterior MI and taken to cath lab. [place] 07/27/17 as below with 3v CAD, LVEF 35-40%, elevated LVEDP. Underwent PCI with successful stenting of the ramus intermediate artery with DES x 2.  Had bradycardic arrest on arrival of   Assessment/Plan   1. Cardiogenic shock - in setting of Anterior MI/STEMI - now s/p DES x 2 to ramus intermediate artery. Extubated 07/28/17 - Flash pulmonary edema this afternoon.  Increase diuretics as below.  - Coox 61.5% this afternoon. Not on any pressors.   2. Acute respiratory failure - Initially required intubation.  - Extubated to BiPAP 07/28/17.  - Failed HFNC this am with episode  of bradycardia. Remains on BiPAP. Per CCM  3. 3v CAD with STEMI, s/p 2x DES to ramus intermediate - Continue ASA and high dose statin at atorvastatin 80 mg daily. - Will need DAPT for one year. On ticragelor currently.  - Currently remains on heparin gtt.   4. Bradycardic arrest - Occurred on arrival to unit post cath.  - Hold BB. Follow closely with starting amiodarone - Had repeat episode of bradycardia x 2 this afternoon. Continue to follow closely. If continues despite stopping BB, may need EP to see.   5. Acute systolic CHF 2/2 ischemic cardiomyopathy - Echo 07/28/17 LVEF 40-45%, Mild AI, Mod MR, Mod LAE, Mod TR, PA peak pressure 65 mm Hg. Thick appearing ventricle  - Volume status elevated with CVP 10-11 and crackles on exam - Increase lasix to 80 mg BID with dose now, and give 2.5 mg metolazone. - Stop BB with cardiogenic shock.   6. New Afib with RVR - No previous history in chart - Stop diltiazem - Start amiodarone gtt. (watch HR closely) - This patients CHA2DS2-VASc is at least 6. Currently on heparin gtt.   7. H/o L breast cancer - Treated by Dr. [name] - Had radiation 7-01/2015, followed by anastrozole and denosumab/Prolia q 6 months.  - Plan to continue anastrozole for total of 5 years per Dr. [name] note.  - Monitoring for osteoporosis.   Medication concerns reviewed with patient and pharmacy team. Barriers identified: None at this time.   Length of Stay: 2  Shirley Friar, PA-C  07/29/2017, 1:16 PM  Advanced Heart Failure Team Pager [phone] (M-F; 7a - 4p)  Please contact [facility] for night-coverage after hours (4p -7a ) and weekends on [email or web address]  Patient seen and examined with the above-signed Advanced Practice Provider and/or Housestaff. I personally reviewed laboratory data, imaging studies and relevant notes. I independently examined the patient and formulated the important aspects of the plan. I have edited the note to reflect any of my  changes or salient points. I have personally discussed the plan with the patient and/or family.  83 y/o woman with h/o breast CA admitted with lateral STEMI now s/p PCI Ramus. Post PCI course complicated by respiratory failure and AF. Echo reviewed personally. EF ~45% but LV appears very thick with near mid-cavity obliteration. There is moderate MR which appears mostly central. CXR with diffuse infiltrates suspicious for edema.   Currently on lasix 40IV bid. CVP 10-11 range. Wearing Bipap. Will increase lasix to 80IV bid and add a dose of  metolazone. May have some lung injury but hopefully not. Will start amio for AF. Watch respiratory status closely. With co-ox 62% no inotropes at this point. Will need w/u for amyloid given appearance of LV on echo.   Glori Bickers, MD  2:47 PM

## 2017-07-29 NOTE — Progress Notes (Signed)
[facility] Progress Note Patient Name: [name] DOB: January 24, 1935 MRN: [id number]   Date of Service  07/29/2017  HPI/Events of Note  AFIB with RVR - Ventricular rate = 120-130. BP = 141/75. CMP already ordered this AM.  eICU Interventions  Will order: 1. Mg++ level STAT.  2. Await CMP results.  3. Cardizem IV infusion. Titrate to HR = 65-105.      Intervention Category Major Interventions: Arrhythmia - evaluation and management  Angela Vazguez Eugene 07/29/2017, 6:05 AM

## 2017-07-29 NOTE — Progress Notes (Addendum)
Progress Note  Patient Name: [name] Date of Encounter: 07/29/2017  Primary Cardiologist: No primary care provider on file. New  Subjective  Feels better, breathing OK. No CP. SVT/New AFib with RVR onset ~4am. Pt felt "soreness" in chest. Started on heparin and dilt ggt which has been transitioned off. Remains in AFib, rates 80's-90's currently while on PO Cardizem. Extubated yesterday afternoon. Required BiPAP overnight, now on HFNC.  Inpatient Medications    Scheduled Meds: . anastrozole  1 mg Oral Daily  . aspirin  81 mg Per Tube Daily  . atorvastatin  80 mg Per Tube q1800  . carvedilol  6.25 mg Per Tube BID WC  . chlorhexidine gluconate (MEDLINE KIT)  15 mL Mouth Rinse BID  . enoxaparin (LOVENOX) injection  40 mg Subcutaneous Q24H  . furosemide  40 mg Intravenous Q12H  . insulin aspart  2-6 Units Subcutaneous Q4H  . magnesium gluconate  500 mg Per Tube BID  . mouth rinse  15 mL Mouth Rinse 10 times per day  . multivitamin  15 mL Per Tube Daily  . omega-3 acid ethyl esters  1 g Per Tube Daily  . pantoprazole (PROTONIX) IV  40 mg Intravenous Q24H  . sodium chloride flush  3 mL Intravenous Q12H  . sodium chloride flush  3 mL Intravenous Q12H  . ticagrelor  90 mg Per Tube BID  . vitamin C  1,000 mg Per Tube Daily   Continuous Infusions: . sodium chloride 250 mL (07/29/17 0700)  . sodium chloride    . diltiazem (CARDIZEM) infusion 15 mg/hr (07/29/17 0917)  . nitroGLYCERIN Stopped (07/27/17 2354)   PRN Meds: sodium chloride, sodium chloride, acetaminophen, fentaNYL, midazolam, midazolam, ondansetron (ZOFRAN) IV, sodium chloride flush, sodium chloride flush   Vital Signs    Vitals:   07/29/17 0727 07/29/17 0800 07/29/17 0803 07/29/17 0900  BP:  117/77    Pulse: 98 75 (!) 105   Resp: (!) 26 (!) 30 (!) 28   Temp:    97.7 F (36.5 C)  TempSrc:      SpO2: 91% 96% 99%   Weight:      Height:        Intake/Output Summary (Last 24 hours) at 07/29/2017 0937 Last  data filed at 07/29/2017 0800 Gross per 24 hour  Intake 512.5 ml  Output 2135 ml  Net -1622.5 ml   Filed Weights   07/27/17 2118 07/28/17 0500 07/29/17 0500  Weight: 170 lb 10.2 oz (77.4 kg) 172 lb 13.5 oz (78.4 kg) 172 lb 6.4 oz (78.2 kg)   Telemetry    SVT around 4am with evolution to AFib with RVR. Now in irregular atrial rhythm with controlled rates - Personally Reviewed  ECG    07/29/17 at 5:42 AM - AFib with RVR, rate 130. ST segment elevation lateral leads with recip ST depression.  07/29/17 at 10:10 AM - Course AFib, rate 60. TWI V4-V6, III, AvF  Physical Exam   ZHY:QMVH pleasant elderly AA female resting comfortably in bed. Daughter at bedside.  Cardiac: IR IR, no murmur. R.  Respiratory: Course breath sounds anteriorly, improve with cough. Breathing unlabored on NFNC. Speaks in full sentences.  GI: Soft, nontender, non-distended  MS: No edema; No deformity. Extr warm/perfused. Neuro:  Nonfocal. Moves all extr spontaneously. Psych: Normal affect, responds to questions appropriately  Labs    Chemistry Recent Labs  Lab 07/27/17 1626  07/27/17 1939 07/27/17 2036 07/28/17 0107  NA 141   < > 139 142  139  K 3.5   < > 3.4* 3.4* 4.8  CL 102   < > 107 106 110  CO2 19*  --  20*  --  18*  GLUCOSE 132*   < > 229* 230* 114*  BUN 27*   < > 27* 28* 28*  CREATININE 1.28*   < > 1.27* 1.20* 1.35*  CALCIUM 9.6  --  9.3  --  9.0  PROT 6.5  --  6.3*  --  6.1*  ALBUMIN 3.7  --  3.5  --  3.2*  AST 29  --  431*  --  521*  ALT 19  --  222*  --  211*  ALKPHOS 38  --  43  --  38  BILITOT 0.7  --  0.6  --  0.7  GFRNONAA 38*  --  39*  --  36*  GFRAA 45*  --  45*  --  42*  ANIONGAP 20*  --  12  --  11   < > = values in this interval not displayed.     Hematology Recent Labs  Lab 07/27/17 1626  07/27/17 1939 07/27/17 2036 07/28/17 0107  WBC 8.7  --  19.9*  --  14.0*  RBC 3.74*  --  4.13  --  4.22  HGB 11.1*   < > 12.4 15.0 12.6  HCT 34.6*   < > 38.6 44.0 38.8  MCV 92.5  --   93.5  --  91.9  MCH 29.7  --  30.0  --  29.9  MCHC 32.1  --  32.1  --  32.5  RDW 13.2  --  13.3  --  13.0  PLT 251  --  267  --  247   < > = values in this interval not displayed.    Cardiac Enzymes Recent Labs  Lab 07/27/17 1626 07/27/17 1907 07/28/17 0107 07/28/17 1427  TROPONINI 0.03* 56.25* >65.00* >65.00*    Radiology    Dg Chest Port 1 View  Result Date: 07/28/2017 CLINICAL DATA:  Followup ventilator support. EXAM: PORTABLE CHEST 1 VIEW COMPARISON:  07/27/2017 FINDINGS: Endotracheal tube tip is 4 cm above the carina. Nasogastric tube enters the abdomen. Left subclavian central line tip in the SVC just above the right atrium. Widespread bilateral pulmonary opacity persists. There may be enlarging pleural effusions. Otherwise, very similar appearance to yesterday. IMPRESSION: Lines and tubes well positioned. Persistent diffuse edema pattern. Question developing effusions. Electronically Signed   By: Nelson Chimes M.D.   On: 07/28/2017 06:37   Dg Chest Port 1 View  Result Date: 07/27/2017 CLINICAL DATA:  Congestive heart failure. On ventilator. Central line placement. EXAM: PORTABLE CHEST 1 VIEW COMPARISON:  Prior today FINDINGS: A new left subclavian central venous catheter seen with tip overlying the distal SVC. No evidence of pneumothorax. Endotracheal tube remains in appropriate position. Mild improvement in diffuse bilateral airspace disease is consistent with decreased pulmonary edema. Heart size remains stable. IMPRESSION: New left subclavian central venous catheter in appropriate position. No pneumothorax visualized. Improvement in bilateral airspace disease, consistent with decreased pulmonary edema. Electronically Signed   By: Earle Gell M.D.   On: 07/27/2017 19:39   Dg Chest Port 1v Same Day  Result Date: 07/27/2017 CLINICAL DATA:  Pulmonary edema and shortness of breath EXAM: PORTABLE CHEST 1 VIEW COMPARISON:  02/01/2011 chest radiograph FINDINGS: An endotracheal tube  with tip 4.5 cm above the carina noted. Cardiomegaly identified. Diffuse bilateral airspace opacities likely represents edema. There may  be trace bilateral pleural effusions present. There is no evidence of pneumothorax. IMPRESSION: Diffuse bilateral airspace opacities likely representing pulmonary edema. Cardiomegaly. Electronically Signed   By: Margarette Canada M.D.   On: 07/27/2017 18:21   Dg Abd Portable 1v  Result Date: 07/27/2017 CLINICAL DATA:  OG tube placement. EXAM: PORTABLE ABDOMEN - 1 VIEW COMPARISON:  None. FINDINGS: An OG tube is noted with tip overlying the proximal-mid stomach. Side hole lies at the EG junction. The bowel gas pattern is unremarkable. LOWER lung airspace disease/consolidation again noted. IMPRESSION: OG tube with tip overlying the proximal-mid stomach with side hole at the EG junction. Recommend advancement. Electronically Signed   By: Margarette Canada M.D.   On: 07/27/2017 22:40    Cardiac Studies   Cardiac catheterization 07/27/17:  Post Atrio lesion is 90% stenosed.  Prox LAD to Dist LAD lesion is 50% stenosed.  Ost 1st Diag lesion is 95% stenosed.  Ost 2nd Diag to 2nd Diag lesion is 90% stenosed.  Mid Cx lesion is 30% stenosed.  Ost Ramus to Ramus lesion is 100% stenosed.  A drug-eluting stent was successfully placed using a STENT SYNERGY DES 2.75X28.  A drug-eluting stent was successfully placed using a STENT SYNERGY DES 2.5X12.  Post intervention, there is a 0% residual stenosis.  There is moderate to severe left ventricular systolic dysfunction.  LV end diastolic pressure is moderately elevated.  The left ventricular ejection fraction is 35-45% by visual estimate.   1. 3 vessel obstructive CAD    - 100% ramus intermediate artery. This is a large vessel and is the culprit lesion.    - 95% small first diagonal    - 90% small second diagonal     - diffusely diseased LAD    - 90% PLOM 2. Moderate to severe LV dysfunction. EF estimated at 35-40%. 3.  Elevated LVEDP 4. Successful stenting of the ramus intermediate artery with DES x 2.   TTE 07/28/17 Study Conclusions - Left ventricle: The cavity size was normal. Wall thickness was   increased in a pattern of mild LVH. Systolic function was mildly   to moderately reduced. The estimated ejection fraction was in the   range of 40% to 45%. There is hypokinesis of the lateral and   inferolateral myocardium. Left ventricular diastolic function   parameters were normal for the patient&'s age. - Aortic valve: Mildly calcified annulus. Trileaflet. There was   mild regurgitation. - Mitral valve: Calcified annulus. There was moderate regurgitation   directed posteriorly. - Left atrium: The atrium was moderately dilated. - Right atrium: Central venous pressure (est): 3 mm Hg. - Atrial septum: A septal defect cannot be excluded. - Tricuspid valve: There was moderate regurgitation. - Pulmonary arteries: Systolic pressure was severely increased. PA   peak pressure: 65 mm Hg (S). - Pericardium, extracardiac: There was no pericardial effusion.  Patient Profile     83 y.o. female w/ hx of breast cancer on chronic chemotherapy, HTN and HLD admitted 2/23 with lateral STEMI. LHC with 3-vessel obstructive coronary disease (100% ramus intermedius-culprit lesion successfully stented with 2 DES), 95% small first diagonal branch, 90% small second diagonal branch, diffusely diseased LAD, 90% posterior lateral obtuse marginal branch with ischemic cardiomyopathy EF 35-40%, elevated left ventricular end-diastolic pressure. Course complicated by cardia genic shock, bradycardic arrest requiring atropine, epi and brief CPR, flash pulmonary edema resulting in hypoxic respiratory failure requiring intubation. Developed new AFib with RVR overnight and now extubated but continues to require respiratory support.  Assessment & Plan    Lateral STEMI 3-Vessel CAD, s/p 2 DES to ramus intermediate  CP resolved. Had some  chest soreness with AFib overnight which has resolved. Diffuse residual CAD as noted above which is being managed medically. On ASA, Brilinta, high-intensity statin, and BB. ACE-I currently held due to AKI.  -Continue current management  Cardiogenic shock/Acute systolic heart failure/transient asystole Appreciate PCCM. Marked improvement in respiratory status since revascularization. Extubated yesterday, still requiring resp support & currently on HFNC. Receiving IV Lasix 54m Q12H and was net neg 1.6L over past 24 hours. Pressures soft this morning but maintaining 90's/50's.  -Still requiring non-invasive resp support. Still with more volume to remove. Cr 1.35, tolerating Lasix OK. -Repeat port CXR this morning -Continue coreg 6.245mBID, titrate as tolerated  New AFib with RVR: Developed early this AM. Started on heparin infusion. Rate controlled with dilt infusion which has been discontinued. Remains on Coreg 6.2543mID which could be increased should she require additional rate control although currently limited by soft BP. Acute stressors as detailed above most likely contributing, hoping arrhythmia will resolve with time. Will need to discuss anticoagulation with patient and family.   Pre-diabetes - Sliding scale insulin.  Last hemoglobin A1c was 5.9 in early February  For questions or updates, please contact [facility] consult [email or web address] for contact info under Cardiology/STEMI.  Signed, Bethany Molt, DO  07/29/2017, 9:37 AM    I have examined the patient and reviewed assessment and plan and discussed with patient.  Agree with above as stated.  New AFib.  Will have to back off of Cardizem and increase Coreg for rate control.  May need some oral diltiazem as well. Plan to keep in unit today.  Hopefully out of unit tomorrow.  Heparin for stroke prevention. Watch left groin where a-line was removed.  JayLarae Grooms

## 2017-07-29 NOTE — Progress Notes (Signed)
RT called to bedside by RN. Pts sat decreased and RR increased into the 30's. Pt placed on BiPAP. RT will continue to monitor as needed.

## 2017-07-29 NOTE — Progress Notes (Signed)
Patient resting quietly on bipap. Breathing is much less labored and RR rate is slowly decreasing.

## 2017-07-30 ENCOUNTER — Inpatient Hospital Stay (HOSPITAL_COMMUNITY): Payer: Medicare Other

## 2017-07-30 DIAGNOSIS — J9600 Acute respiratory failure, unspecified whether with hypoxia or hypercapnia: Secondary | ICD-10-CM

## 2017-07-30 DIAGNOSIS — I480 Paroxysmal atrial fibrillation: Secondary | ICD-10-CM

## 2017-07-30 DIAGNOSIS — J9690 Respiratory failure, unspecified, unspecified whether with hypoxia or hypercapnia: Secondary | ICD-10-CM

## 2017-07-30 LAB — GLUCOSE, CAPILLARY
GLUCOSE-CAPILLARY: 77 mg/dL (ref 65–99)
GLUCOSE-CAPILLARY: 134 mg/dL — AB (ref 65–99)
Glucose-Capillary: 98 mg/dL (ref 65–99)
Glucose-Capillary: 104 mg/dL — ABNORMAL HIGH (ref 65–99)
Glucose-Capillary: 118 mg/dL — ABNORMAL HIGH (ref 65–99)
Glucose-Capillary: 186 mg/dL — ABNORMAL HIGH (ref 65–99)
Glucose-Capillary: 133 mg/dL — ABNORMAL HIGH (ref 65–99)

## 2017-07-30 LAB — CBC
HCT: 31.5 % — ABNORMAL LOW (ref 36.0–46.0)
Hemoglobin: 10.2 g/dL — ABNORMAL LOW (ref 12.0–15.0)
MCH: 29.6 pg (ref 26.0–34.0)
MCHC: 32.4 g/dL (ref 30.0–36.0)
MCV: 91.3 fL (ref 78.0–100.0)
PLATELETS: 181 10*3/uL (ref 150–400)
RBC: 3.45 MIL/uL — AB (ref 3.87–5.11)
RDW: 13.4 % (ref 11.5–15.5)
WBC: 14.9 10*3/uL — AB (ref 4.0–10.5)

## 2017-07-30 LAB — BASIC METABOLIC PANEL
Anion gap: 13 (ref 5–15)
BUN: 35 mg/dL — ABNORMAL HIGH (ref 6–20)
CALCIUM: 9 mg/dL (ref 8.9–10.3)
CO2: 24 mmol/L (ref 22–32)
CREATININE: 1.51 mg/dL — AB (ref 0.44–1.00)
Chloride: 105 mmol/L (ref 101–111)
GFR, EST AFRICAN AMERICAN: 36 mL/min — AB (ref >60)
GFR, EST NON AFRICAN AMERICAN: 31 mL/min — AB (ref >60)
GLUCOSE: 107 mg/dL — AB (ref 65–99)
Potassium: 2.8 mmol/L — ABNORMAL LOW (ref 3.5–5.1)
Sodium: 142 mmol/L (ref 135–145)

## 2017-07-30 LAB — HEPARIN LEVEL (UNFRACTIONATED): HEPARIN UNFRACTIONATED: 0.38 [IU]/mL (ref 0.30–0.70)

## 2017-07-30 LAB — COOXEMETRY PANEL
Carboxyhemoglobin: 1.2 % (ref 0.5–1.5)
METHEMOGLOBIN: 1 % (ref 0.0–1.5)
O2 Saturation: 49.8 %
TOTAL HEMOGLOBIN: 10.4 g/dL — AB (ref 12.0–16.0)

## 2017-07-30 MED ORDER — SPIRONOLACTONE 12.5 MG HALF TABLET
12.5000 mg | ORAL_TABLET | Freq: Every day | ORAL | Status: DC
  Administered 2017-07-30 – 2017-08-01 (×3): 12.5 mg via ORAL
  Filled 2017-07-30 (×4): qty 1

## 2017-07-30 MED ORDER — FUROSEMIDE 10 MG/ML IJ SOLN
40.0000 mg | Freq: Once | INTRAMUSCULAR | Status: AC
  Administered 2017-07-30: 40 mg via INTRAVENOUS
  Filled 2017-07-30: qty 4

## 2017-07-30 MED ORDER — FUROSEMIDE 10 MG/ML IJ SOLN
80.0000 mg | Freq: Once | INTRAMUSCULAR | Status: AC
  Administered 2017-07-30: 80 mg via INTRAVENOUS
  Filled 2017-07-30: qty 8

## 2017-07-30 MED ORDER — HEPARIN (PORCINE) IN NACL 100-0.45 UNIT/ML-% IJ SOLN
1100.0000 [IU]/h | INTRAMUSCULAR | Status: AC
  Filled 2017-07-30: qty 250

## 2017-07-30 MED ORDER — POTASSIUM CHLORIDE 10 MEQ/50ML IV SOLN
10.0000 meq | INTRAVENOUS | Status: AC
  Administered 2017-07-30 (×6): 10 meq via INTRAVENOUS
  Filled 2017-07-30 (×2): qty 50

## 2017-07-30 MED ORDER — SORBITOL 70 % SOLN
30.0000 mL | Freq: Every day | Status: DC | PRN
  Filled 2017-07-30: qty 30

## 2017-07-30 MED ORDER — POTASSIUM CHLORIDE CRYS ER 20 MEQ PO TBCR
40.0000 meq | EXTENDED_RELEASE_TABLET | Freq: Two times a day (BID) | ORAL | Status: DC
  Administered 2017-07-30 – 2017-08-03 (×7): 40 meq via ORAL
  Filled 2017-07-30 (×8): qty 2

## 2017-07-30 NOTE — Progress Notes (Signed)
ANTICOAGULATION CONSULT NOTE - Follow Up Consult  Pharmacy Consult for Heparin Indication: atrial fibrillation  Allergies  Allergen Reactions  . Lisinopril Cough    Patient Measurements: Height: 5\' 8"  (172.7 cm) Weight: 167 lb 5.3 oz (75.9 kg) IBW/kg (Calculated) : 63.9  Vital Signs: Temp: 97.6 F (36.4 C) (02/26 0700) Temp Source: Axillary (02/26 0300) BP: 113/52 (02/26 1100) Pulse Rate: 87 (02/26 1100)  Labs: Recent Labs    07/27/17 1626  07/27/17 1907 07/27/17 1939 07/27/17 2036 07/28/17 0107 07/28/17 1427 07/29/17 1609 07/29/17 2000 07/30/17 0412  HGB 11.1*   < >  --  12.4 15.0 12.6  --   --   --  10.2*  HCT 34.6*   < >  --  38.6 44.0 38.8  --   --   --  31.5*  PLT 251  --   --  267  --  247  --   --   --  181  APTT 29  --   --   --   --   --   --   --   --   --   LABPROT 13.5  --   --   --   --   --   --   --   --   --   INR 1.04  --   --   --   --   --   --   --   --   --   HEPARINUNFRC  --   --   --   --   --   --   --   --  0.61 0.38  CREATININE 1.28*   < >  --  1.27* 1.20* 1.35*  --  1.53*  --  1.51*  TROPONINI 0.03*  --  56.25*  --   --  >65.00* >65.00*  --   --   --    < > = values in this interval not displayed.    Estimated Creatinine Clearance: 30 mL/min (A) (by C-G formula based on SCr of 1.51 mg/dL (H)).   Medications:  Heparin @ 950 units/hr  Assessment: [name] continues on heparin for new afib. Heparin level therapeutic at 0.38. CBC stable. No bleeding reported.  Goal of Therapy:  Heparin level 0.3-0.7 units/ml Monitor platelets by anticoagulation protocol: Yes   Plan:  1) Increase heparin to 1000 units/hr to maintain in goal range 2) Daily heparin level and CBC 3) Follow up oral [facility] plan  Deboraha Sprang 07/30/2017,11:28 AM

## 2017-07-30 NOTE — Progress Notes (Signed)
[place] Progress Note Patient Name: [name] DOB: 01-30-1935 MRN: [id number]   Date of Service  07/30/2017  HPI/Events of Note  K+ = 2.8 and Creatinine = 1.51.   eICU Interventions  Will replace K+.      Intervention Category Major Interventions: Electrolyte abnormality - evaluation and management  Sianne Tejada Eugene 07/30/2017, 6:46 AM

## 2017-07-30 NOTE — Progress Notes (Signed)
PULMONARY / CRITICAL CARE MEDICINE   Name: [name] MRN: [id number] DOB: 02/22/1935    ADMISSION DATE:  07/27/2017 CONSULTATION DATE:  07/27/2017  REFERRING MD:  [place], P  CHIEF COMPLAINT:  S/P bradycardic arrest  HISTORY OF PRESENT ILLNESS:    83 year old  presented to the emergency room with chest pain and acute anterior lateral MI.  She was taken to the cardiac catheterization laboratory where she was found to have 3 vessels coronary disease.  A 100% ramus occlusion was felt to be the culprit vessel this was opened and 2 drug-eluting stents were placed.  On arrival in the coronary intensive care unit the patient suffered from a bradycardic arrest requiring atropine epinephrine and brief CPR for resuscitation.     SUBJECTIVE:    Extubated 2/24>> doing well on 6 L [place] Required bipap for 3 hours 2/26 overnight & 6 L [place] his am WOB better, able to speak in full sentences Diuresing well with lasix, no edema noted on exam Af-RVR overnight , amio gtt  SUBJECTIVE:  Denies CP OOB in chair x 2 hours today Eating well Dyspnea improving  VITAL SIGNS: BP 116/61   Pulse 94   Temp 97.6 F (36.4 C)   Resp (!) 23   Ht 5\' 8"  (1.727 m)   Wt 167 lb 5.3 oz (75.9 kg)   SpO2 97%   BMI 25.44 kg/m   HEMODYNAMICS: CVP:  [2 mmHg-13 mmHg] 3 mmHg  VENTILATOR SETTINGS: Vent Mode: BIPAP;Stand-by FiO2 (%):  [40 %] 40 % Set Rate:  [8 bmp] 8 bmp PEEP:  [6 cmH20] 6 cmH20  INTAKE / OUTPUT: I/O last 3 completed shifts: In: 1463.7 [P.O.:540; I.V.:923.7] Out: 4765 [Urine:4435]  PHYSICAL EXAMINATION: General: frail, elderly, no acute distress , resting Neuro: awake, alert  Following commands in all 4 extremities.  Cardiovascular:S1, S2,, A-fib , No RMG. Minimal edema noted  Lungs: no acc muscles  Breath sounds clear  Bilaterally with few crackles per bases Abdomen: The abdomen is soft, non-tender, non-distended, BS +  Musculoskeletal:good pulses  All of the extremities are warm and dry,  no obvious deformities noted  LABS:  BMET Recent Labs  Lab 07/28/17 0107 07/29/17 1609 07/30/17 0412  NA 139 142 142  K 4.8 3.4* 2.8*  CL 110 104 105  CO2 18* 25 24  BUN 28* 33* 35*  CREATININE 1.35* 1.53* 1.51*  GLUCOSE 114* 139* 107*    Electrolytes Recent Labs  Lab 07/27/17 1939 07/28/17 0107 07/28/17 1427 07/29/17 1609 07/30/17 0412  CALCIUM 9.3 9.0  --  9.1 9.0  MG 2.1  --  2.3  --   --   PHOS  --   --  4.7*  --   --     CBC Recent Labs  Lab 07/27/17 1939 07/27/17 2036 07/28/17 0107 07/30/17 0412  WBC 19.9*  --  14.0* 14.9*  HGB 12.4 15.0 12.6 10.2*  HCT 38.6 44.0 38.8 31.5*  PLT 267  --  247 181    Coag's Recent Labs  Lab 07/27/17 1626  APTT 29  INR 1.04    Sepsis Markers Recent Labs  Lab 07/27/17 1850  LATICACIDVEN 1.4    ABG Recent Labs  Lab 07/27/17 2020 07/27/17 2214 07/28/17 0421  PHART 7.203* 7.285* 7.380  PCO2ART 56.7* 42.0 34.7  PO2ART 107.0 108.0 102.0    Liver Enzymes Recent Labs  Lab 07/27/17 1626 07/27/17 1939 07/28/17 0107  AST 29 431* 521*  ALT 19 222* 211*  ALKPHOS 38 43  38  BILITOT 0.7 0.6 0.7  ALBUMIN 3.7 3.5 3.2*    Cardiac Enzymes Recent Labs  Lab 07/27/17 1907 07/28/17 0107 07/28/17 1427  TROPONINI 56.25* >65.00* >65.00*    Glucose Recent Labs  Lab 07/29/17 1207 07/29/17 1614 07/29/17 1957 07/29/17 2329 07/30/17 0133 07/30/17 0329  GLUCAP 153* 119* 159* 77 98 104*    Imaging Dg Chest Port 1 View  Result Date: 07/29/2017 CLINICAL DATA:  Pulmonary edema. EXAM: PORTABLE CHEST 1 VIEW COMPARISON:  Radiograph of same day. FINDINGS: Stable cardiomegaly. Atherosclerosis of thoracic aorta is noted. Left subclavian catheter line is unchanged with distal tip in expected position of the SVC. No pneumothorax is noted. Stable bilateral diffuse interstitial lung densities are noted most consistent with pulmonary edema or less likely in formation. Bibasilar atelectasis or edema is noted. Mild  bilateral pleural effusions are noted. Bony thorax is unremarkable. IMPRESSION: Stable bilateral lung opacities are noted concerning for edema or pneumonia. Stable bibasilar opacities are noted concerning for atelectasis or edema with associated pleural effusions. Aortic Atherosclerosis (ICD10-I70.0). Electronically Signed   By: Marijo Conception, M.D.   On: 07/29/2017 20:10   Dg Chest Port 1 View  Result Date: 07/29/2017 CLINICAL DATA:  Status postextubation. History of breast carcinoma. Atrial fibrillation EXAM: PORTABLE CHEST 1 VIEW COMPARISON:  July 28, 2017 FINDINGS: Endotracheal tube and nasogastric tube have been removed. Central catheter tip is in the superior vena cava. No pneumothorax. Widespread interstitial and alveolar opacity remain. There is a right pleural effusion. There is a presumed calcified granuloma in the right base. There is cardiomegaly with pulmonary venous hypertension. There is aortic atherosclerosis. No adenopathy. No bone lesions. IMPRESSION: No pneumothorax. Cardiomegaly with widespread interstitial and alveolar opacity likely at least in part due to edema. Small right pleural effusion. Suspect congestive heart failure. There may well be pneumonia superimposed, particularly the lung bases. Presumed granuloma right base.  There is aortic atherosclerosis. Aortic Atherosclerosis (ICD10-I70.0). Electronically Signed   By: Lowella Grip III M.D.   On: 07/29/2017 11:35     DISCUSSION: This is an 83 year old with "prediabetes", who presented with chest pain and acute anterior lateral MI.  She underwent placement of 2 drug-eluting stents in the ramus.  She subsequently suffered from a bradycardic arrest and has a chest x-ray consistent with flash pulmonary edema and cool extremities consistent with cardiogenic shock  ASSESSMENT / PLAN:  PULMONARY A: Acute hypoxic resp failure - extubated 2/24 - wean oxygen for sats > 94% - BiPAP prn increased  WOB ( used x 2 hours  2/26) - Aggressive pulmonary toilet - IS Q 1 while awake - Mobilize as able - PT eval and treat - CXR prn   CARDIOVASCULAR A: Asystole Arrest Cardiogenic shock, now resolved - CAD, lateral STEMI - Acute flash pulmonary edema -  New AF-RVR Plan: - Tele - EKG prn - per cards - amio gtt per cards  DM vs pre-diabetes - SSI  GI prophylaxis is with Protonix DVT prophylaxis with subcu insulin.   Updated daughter at bedside 2/26   Magdalen Spatz, AGACNP-BC [facility] Pager [phone]    07/30/2017, 10:33 AM

## 2017-07-30 NOTE — Progress Notes (Addendum)
Advanced Heart Failure Rounding Note  PCP-Cardiologist: No primary care provider on file.   Subjective:    Awake and alert this am. Up into chair. Breathing much improved. No CP. Denies orthopnea or PND.  Currently on HFNC at 6 lpm.  Feels weak. Was on BiPAP for around 2 hours overnight.   Negative 2.5 L and down 5 lbs. Creatinine elevated at 1.5 but stable. K 2.8.  CXR last night with continued edema throughout and ? Pleural effusions.   Objective:   Weight Range: 167 lb 5.3 oz (75.9 kg) Body mass index is 25.44 kg/m.   Vital Signs:   Temp:  [97.7 F (36.5 C)-98.6 F (37 C)] 97.7 F (36.5 C) (02/26 0300) Pulse Rate:  [63-162] 76 (02/26 0500) Resp:  [17-39] 32 (02/26 0500) BP: (90-125)/(50-83) 94/52 (02/26 0500) SpO2:  [86 %-100 %] 98 % (02/26 0500) Arterial Line BP: (111-134)/(56-72) 111/56 (02/25 0900) FiO2 (%):  [40 %] 40 % (02/26 0115) Weight:  [167 lb 5.3 oz (75.9 kg)] 167 lb 5.3 oz (75.9 kg) (02/26 0645) Last BM Date: 07/27/17  Weight change: Filed Weights   07/28/17 0500 07/29/17 0500 07/30/17 0645  Weight: 172 lb 13.5 oz (78.4 kg) 172 lb 6.4 oz (78.2 kg) 167 lb 5.3 oz (75.9 kg)    Intake/Output:   Intake/Output Summary (Last 24 hours) at 07/30/2017 0742 Last data filed at 07/30/2017 0700 Gross per 24 hour  Intake 1007.45 ml  Output 3600 ml  Net -2592.55 ml      Physical Exam    General: Elderly appearing. NAD. Sitting in chair HEENT: Normal Neck: Supple. JVP 6-7 cm. Carotids 2+ bilat; no bruits. No lymphadenopathy or thyromegaly appreciated. Cor: PMI nondisplaced. Iregular rate & rhythm. No rubs, gallops 2/6 MR Lungs: Crackles throughout.  No wheeze Abdomen: Soft, nontender, nondistended. No hepatosplenomegaly. No bruits or masses. Good bowel sounds. Extremities: No cyanosis, clubbing, rash, or edema. Neuro: Alert & orientedx3, cranial nerves grossly intact. moves all 4 extremities w/o difficulty. Affect pleasant  Telemetry   Afib 70-80s,  personally reviewed.   EKG    No new tracings.    Labs    CBC Recent Labs    07/27/17 1626  07/28/17 0107 07/30/17 0412  WBC 8.7   < > 14.0* 14.9*  NEUTROABS 5.1  --  12.6*  --   HGB 11.1*   < > 12.6 10.2*  HCT 34.6*   < > 38.8 31.5*  MCV 92.5   < > 91.9 91.3  PLT 251   < > 247 181   < > = values in this interval not displayed.   Basic Metabolic Panel Recent Labs    07/27/17 1939  07/28/17 1427 07/29/17 1609 07/30/17 0412  NA 139   < >  --  142 142  K 3.4*   < >  --  3.4* 2.8*  CL 107   < >  --  104 105  CO2 20*   < >  --  25 24  GLUCOSE 229*   < >  --  139* 107*  BUN 27*   < >  --  33* 35*  CREATININE 1.27*   < >  --  1.53* 1.51*  CALCIUM 9.3   < >  --  9.1 9.0  MG 2.1  --  2.3  --   --   PHOS  --   --  4.7*  --   --    < > = values in this  interval not displayed.   Liver Function Tests Recent Labs    07/27/17 1939 07/28/17 0107  AST 431* 521*  ALT 222* 211*  ALKPHOS 43 38  BILITOT 0.6 0.7  PROT 6.3* 6.1*  ALBUMIN 3.5 3.2*   No results for input(s): LIPASE, AMYLASE in the last 72 hours. Cardiac Enzymes Recent Labs    07/27/17 1907 07/28/17 0107 07/28/17 1427  TROPONINI 56.25* >65.00* >65.00*    BNP: BNP (last 3 results) No results for input(s): BNP in the last 8760 hours.  ProBNP (last 3 results) No results for input(s): PROBNP in the last 8760 hours.   D-Dimer No results for input(s): DDIMER in the last 72 hours. Hemoglobin A1C Recent Labs    07/28/17 0331  HGBA1C 5.8*   Fasting Lipid Panel Recent Labs    07/28/17 0331  CHOL 274*  HDL 62  LDLCALC 191*  TRIG 106  CHOLHDL 4.4   Thyroid Function Tests Recent Labs    07/27/17 1939  TSH 1.936    Other results:   Imaging    Dg Chest Port 1 View  Result Date: 07/29/2017 CLINICAL DATA:  Pulmonary edema. EXAM: PORTABLE CHEST 1 VIEW COMPARISON:  Radiograph of same day. FINDINGS: Stable cardiomegaly. Atherosclerosis of thoracic aorta is noted. Left subclavian catheter line  is unchanged with distal tip in expected position of the SVC. No pneumothorax is noted. Stable bilateral diffuse interstitial lung densities are noted most consistent with pulmonary edema or less likely in formation. Bibasilar atelectasis or edema is noted. Mild bilateral pleural effusions are noted. Bony thorax is unremarkable. IMPRESSION: Stable bilateral lung opacities are noted concerning for edema or pneumonia. Stable bibasilar opacities are noted concerning for atelectasis or edema with associated pleural effusions. Aortic Atherosclerosis (ICD10-I70.0). Electronically Signed   By: Marijo Conception, M.D.   On: 07/29/2017 20:10   Dg Chest Port 1 View  Result Date: 07/29/2017 CLINICAL DATA:  Status postextubation. History of breast carcinoma. Atrial fibrillation EXAM: PORTABLE CHEST 1 VIEW COMPARISON:  July 28, 2017 FINDINGS: Endotracheal tube and nasogastric tube have been removed. Central catheter tip is in the superior vena cava. No pneumothorax. Widespread interstitial and alveolar opacity remain. There is a right pleural effusion. There is a presumed calcified granuloma in the right base. There is cardiomegaly with pulmonary venous hypertension. There is aortic atherosclerosis. No adenopathy. No bone lesions. IMPRESSION: No pneumothorax. Cardiomegaly with widespread interstitial and alveolar opacity likely at least in part due to edema. Small right pleural effusion. Suspect congestive heart failure. There may well be pneumonia superimposed, particularly the lung bases. Presumed granuloma right base.  There is aortic atherosclerosis. Aortic Atherosclerosis (ICD10-I70.0). Electronically Signed   By: Lowella Grip III M.D.   On: 07/29/2017 11:35      Medications:     Scheduled Medications: . anastrozole  1 mg Oral Daily  . aspirin  81 mg Oral Daily  . atorvastatin  80 mg Oral q1800  . furosemide  80 mg Intravenous BID  . insulin aspart  2-6 Units Subcutaneous Q4H  . magnesium gluconate   500 mg Oral BID  . mouth rinse  15 mL Mouth Rinse BID  . multivitamin  15 mL Oral Daily  . omega-3 acid ethyl esters  1 g Oral Daily  . pantoprazole (PROTONIX) IV  40 mg Intravenous Q24H  . potassium chloride  40 mEq Oral BID  . sodium chloride flush  3 mL Intravenous Q12H  . sodium chloride flush  3 mL Intravenous Q12H  .  ticagrelor  90 mg Oral BID  . vitamin C  1,000 mg Oral Daily     Infusions: . sodium chloride 10 mL (07/30/17 0628)  . sodium chloride    . amiodarone 30 mg/hr (07/29/17 2015)  . heparin 950 Units/hr (07/29/17 1215)  . potassium chloride       PRN Medications:  sodium chloride, sodium chloride, acetaminophen, ondansetron (ZOFRAN) IV, sodium chloride flush, sodium chloride flush    Patient Profile   [name] is a 83 y.o. female with h/o HTN, osteoporosis, L breast cancer s/p XRT and chemo and definitive surgery, HLD, and pre diabetes.    She presented to ED 07/27/17 with CP. Found to have acute anterior MI and taken to cath lab. [place] 07/27/17 as below with 3v CAD, LVEF 35-40%, elevated LVEDP. Underwent PCI with successful stenting of the ramus intermediate artery with DES x 2.  Had bradycardic arrest on arrival to ICU with epi/atropine and brief CPR.   Assessment/Plan   1. Cardiogenic shock - in setting of Anterior MI/STEMI - now s/p DES x 2 to ramus intermediate artery. Extubated 07/28/17 - Improving. Coox pending this am.   2. Acute respiratory failure - Initially required intubation.  - Extubated to BiPAP 07/28/17.  - Much improved this am with diuresis. Now on HFNC at 6 lpm. Weaning as tolerated.   3. 3v CAD with STEMI, s/p 2x DES to ramus intermediate - Continue ASA and high dose statin at atorvastatin 80 mg daily. - Will need DAPT for one year. On ticragelor currently.  - Currently remains on heparin gtt. No change.  - Cardiac rehab to see.   4. Bradycardic arrest - Occurred on arrival to unit post cath.  - Holding BB. Follow closely  on amiodarone - Had several repeat episodes of bradycardia into 30-40s 07/29/17. BB stopped as above.  - May need EP to see if recurs after BB washed out.   5. Acute systolic CHF 2/2 ischemic cardiomyopathy - Echo 07/28/17 LVEF 40-45%, Mild AI, Mod MR, Mod LAE, Mod TR, PA peak pressure 65 mm Hg. Thick appearing ventricle  - Volume status much improved. CVP down to 5-6 - Give 40 mg IV lasix this am with continued lung crackles and runs of IV K.  - Stop BB with cardiogenic shock.  - Will add low dose spiro 12.5 mg daily.   6. New Afib with RVR - No previous history in chart - Continue amiodarone gtt. (watch HR closely) - This patients CHA2DS2-VASc is at least 6. Currently on heparin gtt.  - If remains in Afib will likely need DCCV. She has been on heparin this entire admit, so would likely not need TEE.   7. H/o L breast cancer - Treated by Dr. [name] - Had radiation 7-01/2015, followed by anastrozole and denosumab/Prolia q 6 months.  - Plan to continue anastrozole for total of 5 years per Dr. [name] note.  - Monitoring for osteoporosis. No change.   Much improved this am. Remains in Afib. Will discuss DCCV with MD.   Medication concerns reviewed with patient and pharmacy team. Barriers identified: None at this time.   Length of Stay: 3  Annamaria Helling  07/30/2017, 7:42 AM  Advanced Heart Failure Team Pager [phone] (M-F; 7a - 4p)  Please contact [facility] for night-coverage after hours (4p -7a ) and weekends on [email or web address]  Patient seen and examined with the above-signed Advanced Practice Provider and/or Housestaff. I personally reviewed laboratory data, imaging studies and  relevant notes. I independently examined the patient and formulated the important aspects of the plan. I have edited the note to reflect any of my changes or salient points. I have personally discussed the plan with the patient and/or family.  Respiratory status much improved but CXR still  with diffuse infiltrates. CVP down. Will give one more dose IV lasix and repeat CXR. May have component of lung injury. Need to further w/u for amyloidosis. Unable to get cMRI due to KD currently. Check SPEP/UPEP. Consider TTR scan.   Co-ox back at 50%. Very tenuous. May not be able to tolerate much more diuresis with thick ventricle. Will follow closely. Continue amio/heparin for AF.  Glori Bickers, MD  10:34 AM

## 2017-07-31 ENCOUNTER — Inpatient Hospital Stay (HOSPITAL_COMMUNITY): Payer: Medicare Other

## 2017-07-31 LAB — GLUCOSE, CAPILLARY
GLUCOSE-CAPILLARY: 126 mg/dL — AB (ref 65–99)
GLUCOSE-CAPILLARY: 140 mg/dL — AB (ref 65–99)
GLUCOSE-CAPILLARY: 157 mg/dL — AB (ref 65–99)
Glucose-Capillary: 126 mg/dL — ABNORMAL HIGH (ref 65–99)
Glucose-Capillary: 113 mg/dL — ABNORMAL HIGH (ref 65–99)
Glucose-Capillary: 124 mg/dL — ABNORMAL HIGH (ref 65–99)

## 2017-07-31 LAB — CBC
HEMATOCRIT: 31.2 % — AB (ref 36.0–46.0)
HEMOGLOBIN: 10.2 g/dL — AB (ref 12.0–15.0)
MCH: 29.6 pg (ref 26.0–34.0)
MCHC: 32.7 g/dL (ref 30.0–36.0)
MCV: 90.4 fL (ref 78.0–100.0)
Platelets: 218 10*3/uL (ref 150–400)
RBC: 3.45 MIL/uL — AB (ref 3.87–5.11)
RDW: 13.7 % (ref 11.5–15.5)
WBC: 10.8 10*3/uL — AB (ref 4.0–10.5)

## 2017-07-31 LAB — BASIC METABOLIC PANEL
Anion gap: 12 (ref 5–15)
BUN: 41 mg/dL — AB (ref 6–20)
CHLORIDE: 104 mmol/L (ref 101–111)
CO2: 22 mmol/L (ref 22–32)
CREATININE: 1.63 mg/dL — AB (ref 0.44–1.00)
Calcium: 8.7 mg/dL — ABNORMAL LOW (ref 8.9–10.3)
GFR calc Af Amer: 33 mL/min — ABNORMAL LOW (ref >60)
GFR calc non Af Amer: 29 mL/min — ABNORMAL LOW (ref >60)
Glucose, Bld: 137 mg/dL — ABNORMAL HIGH (ref 65–99)
Potassium: 2.7 mmol/L — CL (ref 3.5–5.1)
SODIUM: 138 mmol/L (ref 135–145)

## 2017-07-31 LAB — COMPREHENSIVE METABOLIC PANEL
ALK PHOS: 58 U/L (ref 38–126)
ALT: 80 U/L — ABNORMAL HIGH (ref 14–54)
ANION GAP: 11 (ref 5–15)
AST: 95 U/L — ABNORMAL HIGH (ref 15–41)
Albumin: 2.6 g/dL — ABNORMAL LOW (ref 3.5–5.0)
BUN: 38 mg/dL — ABNORMAL HIGH (ref 6–20)
CALCIUM: 9.2 mg/dL (ref 8.9–10.3)
CO2: 25 mmol/L (ref 22–32)
Chloride: 105 mmol/L (ref 101–111)
Creatinine, Ser: 1.55 mg/dL — ABNORMAL HIGH (ref 0.44–1.00)
GFR calc non Af Amer: 30 mL/min — ABNORMAL LOW (ref >60)
GFR, EST AFRICAN AMERICAN: 35 mL/min — AB (ref >60)
Glucose, Bld: 112 mg/dL — ABNORMAL HIGH (ref 65–99)
Potassium: 2.7 mmol/L — CL (ref 3.5–5.1)
SODIUM: 141 mmol/L (ref 135–145)
TOTAL PROTEIN: 6.2 g/dL — AB (ref 6.5–8.1)
Total Bilirubin: 1.2 mg/dL (ref 0.3–1.2)

## 2017-07-31 LAB — URINALYSIS, ROUTINE W REFLEX MICROSCOPIC
Bilirubin Urine: NEGATIVE
GLUCOSE, UA: NEGATIVE mg/dL
Ketones, ur: NEGATIVE mg/dL
NITRITE: NEGATIVE
Protein, ur: 30 mg/dL — AB
Specific Gravity, Urine: 1.016 (ref 1.005–1.030)
pH: 5 (ref 5.0–8.0)

## 2017-07-31 LAB — COOXEMETRY PANEL
Carboxyhemoglobin: 0.8 % (ref 0.5–1.5)
Methemoglobin: 1.3 % (ref 0.0–1.5)
O2 SAT: 51.8 %
TOTAL HEMOGLOBIN: 10.7 g/dL — AB (ref 12.0–16.0)

## 2017-07-31 LAB — PROTEIN ELECTROPHORESIS, SERUM
A/G RATIO SPE: 0.8 (ref 0.7–1.7)
Albumin ELP: 2.4 g/dL — ABNORMAL LOW (ref 2.9–4.4)
Alpha-1-Globulin: 0.5 g/dL — ABNORMAL HIGH (ref 0.0–0.4)
Alpha-2-Globulin: 1.1 g/dL — ABNORMAL HIGH (ref 0.4–1.0)
BETA GLOBULIN: 0.8 g/dL (ref 0.7–1.3)
GAMMA GLOBULIN: 0.7 g/dL (ref 0.4–1.8)
Globulin, Total: 3.1 g/dL (ref 2.2–3.9)
Total Protein ELP: 5.5 g/dL — ABNORMAL LOW (ref 6.0–8.5)

## 2017-07-31 LAB — IMMUNOFIXATION, URINE

## 2017-07-31 LAB — HEPARIN LEVEL (UNFRACTIONATED): Heparin Unfractionated: 0.22 IU/mL — ABNORMAL LOW (ref 0.30–0.70)

## 2017-07-31 LAB — MAGNESIUM: MAGNESIUM: 1.8 mg/dL (ref 1.7–2.4)

## 2017-07-31 MED ORDER — POTASSIUM CHLORIDE 10 MEQ/100ML IV SOLN
10.0000 meq | INTRAVENOUS | Status: AC
  Administered 2017-07-31 (×8): 10 meq via INTRAVENOUS
  Filled 2017-07-31 (×6): qty 100

## 2017-07-31 MED ORDER — CLOPIDOGREL BISULFATE 75 MG PO TABS
75.0000 mg | ORAL_TABLET | Freq: Every day | ORAL | Status: DC
  Administered 2017-08-01 – 2017-08-07 (×7): 75 mg via ORAL
  Filled 2017-07-31 (×7): qty 1

## 2017-07-31 MED ORDER — CLOPIDOGREL BISULFATE 300 MG PO TABS
300.0000 mg | ORAL_TABLET | Freq: Once | ORAL | Status: AC
  Administered 2017-07-31: 300 mg via ORAL
  Filled 2017-07-31: qty 1

## 2017-07-31 MED ORDER — FUROSEMIDE 10 MG/ML IJ SOLN
60.0000 mg | Freq: Once | INTRAMUSCULAR | Status: AC
  Administered 2017-07-31: 60 mg via INTRAVENOUS
  Filled 2017-07-31: qty 6

## 2017-07-31 MED ORDER — POTASSIUM CHLORIDE 10 MEQ/50ML IV SOLN
10.0000 meq | INTRAVENOUS | Status: AC
  Administered 2017-07-31 – 2017-08-01 (×6): 10 meq via INTRAVENOUS
  Filled 2017-07-31: qty 50

## 2017-07-31 MED ORDER — FUROSEMIDE 10 MG/ML IJ SOLN
80.0000 mg | Freq: Once | INTRAMUSCULAR | Status: AC
  Administered 2017-07-31: 80 mg via INTRAVENOUS
  Filled 2017-07-31: qty 8

## 2017-07-31 MED ORDER — ACETAMINOPHEN 325 MG PO TABS
650.0000 mg | ORAL_TABLET | ORAL | Status: DC | PRN
Start: 2017-07-31 — End: 2017-08-07
  Administered 2017-07-31 – 2017-08-07 (×3): 650 mg via ORAL
  Filled 2017-07-31 (×5): qty 2

## 2017-07-31 MED ORDER — APIXABAN 2.5 MG PO TABS
2.5000 mg | ORAL_TABLET | Freq: Two times a day (BID) | ORAL | Status: DC
  Administered 2017-07-31 – 2017-08-07 (×15): 2.5 mg via ORAL
  Filled 2017-07-31 (×15): qty 1

## 2017-07-31 MED ORDER — SODIUM CHLORIDE 0.9% FLUSH: 10.0000 mL | INTRAVENOUS | Status: DC | PRN

## 2017-07-31 MED ORDER — POTASSIUM CHLORIDE 10 MEQ/50ML IV SOLN
10.0000 meq | INTRAVENOUS | Status: AC
  Administered 2017-08-01 (×2): 10 meq via INTRAVENOUS

## 2017-07-31 MED ORDER — SODIUM CHLORIDE 0.9 % IV SOLN
1.0000 g | INTRAVENOUS | Status: DC
  Administered 2017-07-31 – 2017-08-01 (×2): 1 g via INTRAVENOUS
  Filled 2017-07-31 (×2): qty 10

## 2017-07-31 MED ORDER — SODIUM CHLORIDE 0.9% FLUSH
10.0000 mL | Freq: Two times a day (BID) | INTRAVENOUS | Status: DC
  Administered 2017-07-31: 10 mL
  Administered 2017-08-02 – 2017-08-03 (×2): 30 mL
  Administered 2017-08-04 – 2017-08-05 (×3): 10 mL

## 2017-07-31 MED ORDER — POTASSIUM CHLORIDE 10 MEQ/50ML IV SOLN
INTRAVENOUS | Status: AC
  Filled 2017-07-31: qty 50

## 2017-07-31 MED ORDER — CHLORHEXIDINE GLUCONATE CLOTH 2 % EX PADS
6.0000 | MEDICATED_PAD | Freq: Every day | CUTANEOUS | Status: DC
  Administered 2017-07-31 – 2017-08-05 (×6): 6 via TOPICAL

## 2017-07-31 NOTE — Evaluation (Signed)
Physical Therapy Evaluation Patient Details Name: [name] MRN: [id number] DOB: August 26, 1934 Today's Date: 07/31/2017   History of Present Illness  Pt is a 83 y.o. female with PMH of breast CA, HTN, HLD, who presents 07/27/17 with acute onset of chest pain; ECG consistent with STEMI. S/p PCI with stent 2/23; pt with brief bradycardic arrest requiring intubation 2/23-2/24. CXR showing pulmonary edema.    Clinical Impression  Pt presents with an overall decrease in functional mobility secondary to above. PTA, pt indep and maintains an active lifestyle; has family available for 24/7 support. Today, pt able to amb 250' with RW and intermittent min guard for balance; further distance limited secondary to fatigue. SpO2 >90% on 4L O2 [place]. Pt would benefit from continued acute PT services to maximize functional mobility and independence prior to d/c with HHPT services.     Follow Up Recommendations Home health PT    Equipment Recommendations  Rolling walker with 5" wheels    Recommendations for Other Services       Precautions / Restrictions Precautions Precautions: Fall Restrictions Weight Bearing Restrictions: No      Mobility  Bed Mobility Overal bed mobility: Independent                Transfers Overall transfer level: Needs assistance Equipment used: Rolling walker (2 wheeled);None Transfers: Sit to/from Stand Sit to Stand: Min guard         General transfer comment: Stood 2x with no UE support, 1x uncontrolled descend back to bed due to initial instability. Able to stand again with RW and min guard for balance; educ on hand placement with RW  Ambulation/Gait Ambulation/Gait assistance: Min guard Ambulation Distance (Feet): 250 Feet Assistive device: Rolling walker (2 wheeled) Gait Pattern/deviations: Step-through pattern;Decreased stride length;Drifts right/left Gait velocity: Decreased Gait velocity interpretation: <1.8 ft/sec, indicative of risk for recurrent  falls General Gait Details: Slow, slightly unsteady amb with RW and min guard for balance. Pt with good ability to self-correct intermittent drifting. Declining further distance secondary to fatigue  Stairs            Wheelchair Mobility    Modified Rankin (Stroke Patients Only)       Balance Overall balance assessment: Needs assistance   Sitting balance-Leahy Scale: Good Sitting balance - Comments: Indep to don/doff socks sitting EOB     Standing balance-Leahy Scale: Fair Standing balance comment: Can static stand with no UE; dynamic stability improved with RW                             Pertinent Vitals/Pain Pain Assessment: No/denies pain    Home Living Family/patient expects to be discharged to:: Private residence Living Arrangements: Spouse/significant other;Children Available Help at Discharge: Family;Available 24 hours/day Type of Home: House Home Access: Stairs to enter Entrance Stairs-Rails: None Entrance Stairs-Number of Steps: 2(5 in front w/ rail) Home Layout: One level Home Equipment: None      Prior Function Level of Independence: Independent         Comments: Does not work, but remains very active. Indep with gardening and housework. Drives. Involved at church.     Hand Dominance        Extremity/Trunk Assessment   Upper Extremity Assessment Upper Extremity Assessment: Overall WFL for tasks assessed    Lower Extremity Assessment Lower Extremity Assessment: Generalized weakness       Communication   Communication: No difficulties  Cognition Arousal/Alertness: Awake/alert Behavior  During Therapy: Flat affect Overall Cognitive Status: Within Functional Limits for tasks assessed                                        General Comments General comments (skin integrity, edema, etc.): Daughter present during session    Exercises     Assessment/Plan    PT Assessment Patient needs continued PT services   PT Problem List Decreased strength;Decreased activity tolerance;Decreased balance;Decreased mobility;Decreased knowledge of use of DME       PT Treatment Interventions DME instruction;Gait training;Stair training;Functional mobility training;Therapeutic activities;Therapeutic exercise;Balance training;Patient/family education    PT Goals (Current goals can be found in the Care Plan section)  Acute Rehab PT Goals Patient Stated Goal: Return home PT Goal Formulation: With patient Time For Goal Achievement: 08/14/17 Potential to Achieve Goals: Good    Frequency Min 3X/week   Barriers to discharge        Co-evaluation               AM-PAC PT "6 Clicks" Daily Activity  Outcome Measure Difficulty turning over in bed (including adjusting bedclothes, sheets and blankets)?: None Difficulty moving from lying on back to sitting on the side of the bed? : None Difficulty sitting down on and standing up from a chair with arms (e.g., wheelchair, bedside commode, etc,.)?: A Little Help needed moving to and from a bed to chair (including a wheelchair)?: A Little Help needed walking in hospital room?: A Little Help needed climbing 3-5 steps with a railing? : A Little 6 Click Score: 20    End of Session Equipment Utilized During Treatment: Gait belt;Oxygen Activity Tolerance: Patient tolerated treatment well;Patient limited by fatigue Patient left: in chair;with call bell/phone within reach Nurse Communication: Mobility status PT Visit Diagnosis: Other abnormalities of gait and mobility (R26.89)    Time: 1916-6060 PT Time Calculation (min) (ACUTE ONLY): 26 min   Charges:   PT Evaluation $PT Eval Moderate Complexity: 1 Mod PT Treatments $Gait Training: 8-22 mins   PT G Codes:       Mabeline Caras, PT, DPT Acute Rehab Services  Pager: [place] 07/31/2017, 12:26 PM

## 2017-07-31 NOTE — Progress Notes (Signed)
  Paged for chest pain.   On my arrival to room patient resting comfortable in bed.    States she was having midsternal chest pain when breathing in, directly underneath ECG electrode. When electrode was moved, her pain went away. She then had recurrent, less severe pain at the new electrode site.   No exertional chest pain. No obvious breakdown of skin, erythema, or tenderness.    Legrand Como [phone]" [place], PA-C 07/31/2017 2:10 PM

## 2017-07-31 NOTE — Progress Notes (Signed)
CRITICAL VALUE ALERT  Critical Value:  K 2.7   Date & Time Notied:  07/31/17 2231  Provider Notified: Dr. Therisa Doyne cardiology   Orders Received/Actions taken: Administer 8 runs of IV KCL.

## 2017-07-31 NOTE — Progress Notes (Signed)
ANTICOAGULATION CONSULT NOTE - Follow Up Consult  Pharmacy Consult for Heparin --> Eliquis Indication: atrial fibrillation  Allergies  Allergen Reactions  . Lisinopril Cough    Patient Measurements: Height: 5\' 8"  (172.7 cm) Weight: 166 lb 0.1 oz (75.3 kg) IBW/kg (Calculated) : 63.9  Vital Signs: Temp: 97.9 F (36.6 C) (02/27 0831) Temp Source: Oral (02/27 0831) BP: 100/71 (02/27 0800) Pulse Rate: 85 (02/27 0800)  Labs: Recent Labs    07/28/17 1427 07/29/17 1609 07/29/17 2000 07/30/17 0412 07/31/17 0511  HGB  --   --   --  10.2* 10.2*  HCT  --   --   --  31.5* 31.2*  PLT  --   --   --  181 218  HEPARINUNFRC  --   --  0.61 0.38 0.22*  CREATININE  --  1.53*  --  1.51* 1.55*  TROPONINI >65.00*  --   --   --   --     Estimated Creatinine Clearance: 29.2 mL/min (A) (by C-G formula based on SCr of 1.55 mg/dL (H)).  Assessment: 82yof on heparin for afib now to switch to eliquis. Hematuria noted last night but urine mostly clear today. CBC stable. Will start reduced dose eliquis since she meets two criteria (age 5 and sCr 1.5). She is also now on triple therapy with aspirin and brilinta - will discuss with team about changing to plavix to decrease bleeding risk.  Goal of Therapy:  Heparin level 0.3-0.7 units/ml Monitor platelets by anticoagulation protocol: Yes   Plan:  1) Stop heparin 2) Start eliquis 2.5mg  po bid 3) Case management consult for cost 4) Follow up plan for triple therapy  Deboraha Sprang 07/31/2017,9:45 AM

## 2017-07-31 NOTE — Progress Notes (Signed)
ANTICOAGULATION CONSULT NOTE - Follow Up Consult  Pharmacy Consult for heparin Indication: atrial fibrillation  Labs: Recent Labs    07/28/17 1427 07/29/17 1609 07/29/17 2000 07/30/17 0412 07/31/17 0511  HGB  --   --   --  10.2*  --   HCT  --   --   --  31.5*  --   PLT  --   --   --  181  --   HEPARINUNFRC  --   --  0.61 0.38 0.22*  CREATININE  --  1.53*  --  1.51*  --   TROPONINI >65.00*  --   --   --   --     Assessment: 83yo female now subtherapeutic on heparin after two levels at goal though had been quickly trending down; notably Hgb has been dropping, not yet resulted this am, RN notes hematuria since yesterday, no other overt signs of bleeding.  Goal of Therapy:  Heparin level 0.3-0.7 units/ml   Plan:  Will increase heparin gtt cautiously to 1100 units/hr and check level in 8 hours.    Wynona Neat, PharmD, BCPS  07/31/2017,6:24 AM

## 2017-07-31 NOTE — Progress Notes (Addendum)
Contacted by RN re: low K+  K+ 2.8>>2.7 after 40 meq po and 60 meq IV yesterday  Will give 8 runs IV KCl   Kdur increased to 40 meq bid, may need more. Only got 1 dose 02/26  Rosaria Ferries, PA-C 07/31/2017 6:52 AM Beeper [phone]

## 2017-07-31 NOTE — Progress Notes (Addendum)
Advanced Heart Failure Rounding Note  PCP-Cardiologist: No primary care provider on file.   Subjective:    Coox 51.8% this am. Not on pressor support.   Remains quite weak. Feels cold. Breathing slowly improving. Remains on HFNC at 5 LPM. Denies lightheadedness or dizziness.   K 2.7 this am. Negative 1.3 L and down 1 lb. Creatinine stable at 1.55. CVP ~6-7  CXR yesterday with no change, continued pulmonary edema.  CXR this am personally reviewed. Airspace seems to be slowly clearing, but remains with pulmonary edema throughout.  Will check chest CT.   Objective:   Weight Range: 166 lb 0.1 oz (75.3 kg) Body mass index is 25.24 kg/m.   Vital Signs:   Temp:  [97.9 F (36.6 C)-98.5 F (36.9 C)] 97.9 F (36.6 C) (02/27 0300) Pulse Rate:  [39-120] 110 (02/27 0630) Resp:  [12-42] 29 (02/27 0630) BP: (96-146)/(48-74) 123/68 (02/27 0600) SpO2:  [93 %-100 %] 98 % (02/27 0630) FiO2 (%):  [40 %] 40 % (02/27 0045) Weight:  [166 lb 0.1 oz (75.3 kg)] 166 lb 0.1 oz (75.3 kg) (02/27 0630) Last BM Date: 07/27/17  Weight change: Filed Weights   07/29/17 0500 07/30/17 0645 07/31/17 0630  Weight: 172 lb 6.4 oz (78.2 kg) 167 lb 5.3 oz (75.9 kg) 166 lb 0.1 oz (75.3 kg)    Intake/Output:   Intake/Output Summary (Last 24 hours) at 07/31/2017 0747 Last data filed at 07/31/2017 0640 Gross per 24 hour  Intake 1140.6 ml  Output 2453 ml  Net -1312.4 ml      Physical Exam    General: Elderly appearing. NAD. Lying in bed HEENT: Normal Neck: Supple. JVP 6-7. Carotids 2+ bilat; no bruits. No thyromegaly or nodule noted. Cor: PMI nondisplaced. Irregularly irregular. 2/6 MR.  Lungs: Crackles throughout. No wheeze.  Abdomen: Soft, non-tender, non-distended, no HSM. No bruits or masses. +BS  Extremities: No cyanosis, clubbing, or rash. R and LLE no edema. Warm Neuro: Alert & orientedx3, cranial nerves grossly intact. moves all 4 extremities w/o difficulty. Affect flat  Telemetry    Afib 70-80s, personally reviewed.   EKG    No new tracings.    Labs    CBC Recent Labs    07/30/17 0412 07/31/17 0511  WBC 14.9* 10.8*  HGB 10.2* 10.2*  HCT 31.5* 31.2*  MCV 91.3 90.4  PLT 181 937   Basic Metabolic Panel Recent Labs    07/28/17 1427  07/30/17 0412 07/31/17 0511  NA  --    < > 142 141  K  --    < > 2.8* 2.7*  CL  --    < > 105 105  CO2  --    < > 24 25  GLUCOSE  --    < > 107* 112*  BUN  --    < > 35* 38*  CREATININE  --    < > 1.51* 1.55*  CALCIUM  --    < > 9.0 9.2  MG 2.3  --   --   --   PHOS 4.7*  --   --   --    < > = values in this interval not displayed.   Liver Function Tests Recent Labs    07/31/17 0511  AST 95*  ALT 80*  ALKPHOS 58  BILITOT 1.2  PROT 6.2*  ALBUMIN 2.6*   No results for input(s): LIPASE, AMYLASE in the last 72 hours. Cardiac Enzymes Recent Labs    07/28/17 1427  TROPONINI >65.00*    BNP: BNP (last 3 results) No results for input(s): BNP in the last 8760 hours.  ProBNP (last 3 results) No results for input(s): PROBNP in the last 8760 hours.   D-Dimer No results for input(s): DDIMER in the last 72 hours. Hemoglobin A1C No results for input(s): HGBA1C in the last 72 hours. Fasting Lipid Panel No results for input(s): CHOL, HDL, LDLCALC, TRIG, CHOLHDL, LDLDIRECT in the last 72 hours. Thyroid Function Tests No results for input(s): TSH, T4TOTAL, T3FREE, THYROIDAB in the last 72 hours.  Invalid input(s): FREET3  Other results:   Imaging    Dg Chest Port 1 View  Result Date: 07/30/2017 CLINICAL DATA:  Abnormal chest exam. The patient is 3 days postoperative from coronary angioplasty and stent placement. EXAM: PORTABLE CHEST 1 VIEW COMPARISON:  Portable chest x-ray of July 29, 2017. FINDINGS: The lungs are adequately inflated. The interstitial markings are increased diffusely. The hemidiaphragms are obscured. The cardiac silhouette is enlarged and the pulmonary vascularity is indistinct. There  is calcification in the wall of the aortic arch. The left subclavian venous catheter tip projects at the junction of the proximal and middle portions of the SVC. The bony thorax exhibits no acute abnormality. IMPRESSION: Widespread pulmonary interstitial opacities with some airspace opacities as well most consistent with pulmonary edema though pneumonia is not excluded. There has been slight interval deterioration in the appearance of the chest since yesterday's study. Stable cardiomegaly. Thoracic aortic atherosclerosis. Electronically Signed   By: David  Martinique M.D.   On: 07/30/2017 14:18     Medications:     Scheduled Medications: . anastrozole  1 mg Oral Daily  . aspirin  81 mg Oral Daily  . atorvastatin  80 mg Oral q1800  . insulin aspart  2-6 Units Subcutaneous Q4H  . magnesium gluconate  500 mg Oral BID  . mouth rinse  15 mL Mouth Rinse BID  . multivitamin  15 mL Oral Daily  . omega-3 acid ethyl esters  1 g Oral Daily  . pantoprazole (PROTONIX) IV  40 mg Intravenous Q24H  . potassium chloride  40 mEq Oral BID  . sodium chloride flush  3 mL Intravenous Q12H  . sodium chloride flush  3 mL Intravenous Q12H  . spironolactone  12.5 mg Oral Daily  . ticagrelor  90 mg Oral BID  . vitamin C  1,000 mg Oral Daily    Infusions: . sodium chloride 250 mL (07/30/17 2000)  . sodium chloride    . amiodarone 30 mg/hr (07/30/17 2005)  . heparin 1,100 Units/hr (07/31/17 7829)  . potassium chloride      PRN Medications: sodium chloride, sodium chloride, acetaminophen, ondansetron (ZOFRAN) IV, sodium chloride flush, sodium chloride flush, sorbitol  Patient Profile   RIDHIMA GOLBERG is a 83 y.o. female with h/o HTN, osteoporosis, L breast cancer s/p XRT and chemo and definitive surgery, HLD, and pre diabetes.    She presented to ED 07/27/17 with CP. Found to have acute anterior MI and taken to cath lab. [place] 07/27/17 as below with 3v CAD, LVEF 35-40%, elevated LVEDP. Underwent PCI with  successful stenting of the ramus intermediate artery with DES x 2.  Had bradycardic arrest on arrival to ICU with epi/atropine and brief CPR.   Assessment/Plan   1. Cardiogenic shock - in setting of Anterior MI/STEMI - now s/p DES x 2 to ramus intermediate artery. Extubated 07/28/17 - Coox marginal at 51.8% this am. Continue to follow.   2. Acute  respiratory failure - Initially required intubation.  - Extubated to BiPAP 07/28/17.  - Much improved this am with diuresis. Now on HFNC at 6 lpm. Weaning as tolerated.  - CXR this am with slight clearing, but persistent airspace changes. Will check chest CT.   3. 3v CAD with STEMI, s/p 2x DES to ramus intermediate - Continue ASA and high dose statin at atorvastatin 80 mg daily. - Discussed with Dr. Martinique personally. Will change brilinta to Plavix.  - Will need DAPT for one year.  - Currently remains on heparin gtt. No change.   - Cardiac rehab to see.   4. Bradycardic arrest - Occurred on arrival to unit post cath.  - Holding BB. Follow closely on amiodarone - Had several repeat episodes of bradycardia into 30-40s 07/29/17. BB stopped as above.  - No further bradycardia.   5. Acute systolic CHF 2/2 ischemic cardiomyopathy - Echo 07/28/17 LVEF 40-45%, Mild AI, Mod MR, Mod LAE, Mod TR, PA peak pressure 65 mm Hg. Thick appearing ventricle  - Volume status improving.  - CVP ~6 this am. Will give one more dose 60 mg IV lasix and follow.  Check Mg. - Stop BB with cardiogenic shock.  - Continue  spiro 12.5 mg daily.   6. New Afib with RVR - No previous history in chart. Noted overnight 07/28/17. - Continue amiodarone gtt. (watch HR closely) - This patients CHA2DS2-VASc is at least 6.   - Plan for DCCV tomorrow. Covered on heparin currently.  - Will transition to Eliquis for longterm AC.   7. H/o L breast cancer - Treated by Dr. [name] - Had radiation 7-01/2015, followed by anastrozole and denosumab/Prolia q 6 months.  - Plan to  continue anastrozole for total of 5 years per Dr. [name] note.  - Monitoring for osteoporosis. No change.   8. Hematuria - Reported overnight. Check UA today. Urine looks mostly clear with sediment this am.   9. Hypokalemia - Supp aggressively. In for runs and IV lasix.  - Check Mg.   Slowly improving. Plan for DCCV tomorrow. Coox remains marginal.   Medication concerns reviewed with patient and pharmacy team. Barriers identified: None at this time.   Length of Stay: 4  Annamaria Helling  07/31/2017, 7:47 AM  Advanced Heart Failure Team Pager [phone] (M-F; 7a - 4p)  Please contact [facility] for night-coverage after hours (4p -7a ) and weekends on [email or web address]  Patient seen and examined with the above-signed Advanced Practice Provider and/or Housestaff. I personally reviewed laboratory data, imaging studies and relevant notes. I independently examined the patient and formulated the important aspects of the plan. I have edited the note to reflect any of my changes or salient points. I have personally discussed the plan with the patient and/or family.  She continue to have significant O2 requirement. CXR with diffuse opacities but improving. Chest CT reviewed personally with Radiology and felt to be most c/w pulmonary edema. Will continue to push diuresis. Co-ox remains marginal. However now back in NSR on amio, so hopefully co-ox will improve. No inotropes at this point. Has some hematuria. AC regimen d/w interventional team and will switch to ASA 81/eliquis 2.5 bid and Plavix. Brilinta stop and Plavix load d/w PharmD. Continue to work with PT. Current rec is HHPT.   UA c/w with UTI. Will start ceftriaxone. Remove Foley as soon as aggressive diuresis is complete.   Glori Bickers, MD  11:02 PM

## 2017-07-31 NOTE — Progress Notes (Signed)
Pharmacy Antibiotic Note  [name] is a 83 y.o. female admitted on 07/27/2017 with possible UTI .  Pharmacy has been consulted for rocephin dosing.  Plan: Rocephin 1g IV Q 24 hrs Pharmacy sign off  Height: 5\' 8"  (172.7 cm) Weight: 166 lb 0.1 oz (75.3 kg) IBW/kg (Calculated) : 63.9  Temp (24hrs), Avg:97.9 F (36.6 C), Min:97.7 F (36.5 C), Max:98.5 F (36.9 C)  Recent Labs  Lab 07/27/17 1626  07/27/17 1850 07/27/17 1939 07/27/17 2036 07/28/17 0107 07/29/17 1609 07/30/17 0412 07/31/17 0511  WBC 8.7  --   --  19.9*  --  14.0*  --  14.9* 10.8*  CREATININE 1.28*   < >  --  1.27* 1.20* 1.35* 1.53* 1.51* 1.55*  LATICACIDVEN  --   --  1.4  --   --   --   --   --   --    < > = values in this interval not displayed.    Estimated Creatinine Clearance: 29.2 mL/min (A) (by C-G formula based on SCr of 1.55 mg/dL (H)).    Allergies  Allergen Reactions  . Lisinopril Cough    Thank you for allowing pharmacy to be a part of this patient's care.  Maryanna Shape, PharmD, BCPS  Clinical Pharmacist  Pager: [phone]   07/31/2017 5:14 PM

## 2017-07-31 NOTE — Progress Notes (Signed)
Per [employer] check for [employer]  # 2.  S / W  [facility] @ [facility] # [phone]    ELIQUIS 5 MG BID  COVER- YES  CO-PAY- $ 45.00  TIER- 3 DRUG  PRIOR APPROVAL- NO   PREFERRED PHARMACY ; [facility] AND [facility]

## 2017-08-01 ENCOUNTER — Encounter (HOSPITAL_COMMUNITY)
Admission: EM | Disposition: A | Discharge status: Home Health | Payer: Self-pay | Source: Home / Self Care | Attending: Cardiology

## 2017-08-01 LAB — CBC
HCT: 31.2 % — ABNORMAL LOW (ref 36.0–46.0)
Hemoglobin: 10.1 g/dL — ABNORMAL LOW (ref 12.0–15.0)
MCH: 29 pg (ref 26.0–34.0)
MCHC: 32.4 g/dL (ref 30.0–36.0)
MCV: 89.7 fL (ref 78.0–100.0)
Platelets: 254 10*3/uL (ref 150–400)
RBC: 3.48 MIL/uL — ABNORMAL LOW (ref 3.87–5.11)
RDW: 13.6 % (ref 11.5–15.5)
WBC: 11.7 10*3/uL — AB (ref 4.0–10.5)

## 2017-08-01 LAB — COOXEMETRY PANEL
CARBOXYHEMOGLOBIN: 0.9 % (ref 0.5–1.5)
Methemoglobin: 1.3 % (ref 0.0–1.5)
O2 Saturation: 59.3 %
Total hemoglobin: 10.2 g/dL — ABNORMAL LOW (ref 12.0–16.0)

## 2017-08-01 LAB — GLUCOSE, CAPILLARY
GLUCOSE-CAPILLARY: 119 mg/dL — AB (ref 65–99)
GLUCOSE-CAPILLARY: 117 mg/dL — AB (ref 65–99)
GLUCOSE-CAPILLARY: 139 mg/dL — AB (ref 65–99)
Glucose-Capillary: 110 mg/dL — ABNORMAL HIGH (ref 65–99)
Glucose-Capillary: 157 mg/dL — ABNORMAL HIGH (ref 65–99)
Glucose-Capillary: 141 mg/dL — ABNORMAL HIGH (ref 65–99)
Glucose-Capillary: 136 mg/dL — ABNORMAL HIGH (ref 65–99)

## 2017-08-01 LAB — BASIC METABOLIC PANEL
ANION GAP: 12 (ref 5–15)
BUN: 39 mg/dL — ABNORMAL HIGH (ref 6–20)
CO2: 21 mmol/L — ABNORMAL LOW (ref 22–32)
Calcium: 9 mg/dL (ref 8.9–10.3)
Chloride: 105 mmol/L (ref 101–111)
Creatinine, Ser: 1.65 mg/dL — ABNORMAL HIGH (ref 0.44–1.00)
GFR calc Af Amer: 33 mL/min — ABNORMAL LOW (ref >60)
GFR, EST NON AFRICAN AMERICAN: 28 mL/min — AB (ref >60)
Glucose, Bld: 125 mg/dL — ABNORMAL HIGH (ref 65–99)
POTASSIUM: 4 mmol/L (ref 3.5–5.1)
SODIUM: 138 mmol/L (ref 135–145)

## 2017-08-01 SURGERY — CARDIOVERSION
Anesthesia: General

## 2017-08-01 MED ORDER — ADULT MULTIVITAMIN W/MINERALS CH
1.0000 | ORAL_TABLET | Freq: Every day | ORAL | Status: DC
Start: 2017-08-01 — End: 2017-08-07
  Administered 2017-08-01 – 2017-08-07 (×7): 1 via ORAL
  Filled 2017-08-01 (×6): qty 1

## 2017-08-01 MED ORDER — LOSARTAN POTASSIUM 25 MG PO TABS
25.0000 mg | ORAL_TABLET | Freq: Every day | ORAL | Status: DC
Start: 2017-08-01 — End: 2017-08-03
  Administered 2017-08-01 – 2017-08-02 (×2): 25 mg via ORAL
  Filled 2017-08-01 (×2): qty 1

## 2017-08-01 MED ORDER — FUROSEMIDE 40 MG PO TABS
60.0000 mg | ORAL_TABLET | Freq: Every day | ORAL | Status: DC
  Administered 2017-08-01 – 2017-08-03 (×3): 60 mg via ORAL
  Filled 2017-08-01 (×3): qty 1

## 2017-08-01 MED ORDER — MAGNESIUM SULFATE 2 GM/50ML IV SOLN
2.0000 g | Freq: Once | INTRAVENOUS | Status: AC
  Administered 2017-08-01: 2 g via INTRAVENOUS
  Filled 2017-08-01: qty 50

## 2017-08-01 NOTE — Progress Notes (Signed)
CARDIAC REHAB PHASE I   PRE:  Rate/Rhythm: 66 SR  BP:  Sitting: 123/63     SaO2: 99% 4L  MODE:  Ambulation: 190 ft   POST:  Rate/Rhythm: 94 ST  BP:  Sitting: 138/62    SaO2: 95% 4L  1432-1458 Pt ambulated with rolling walked with two assist and one rest break.  Gait unsteady at times with scissoring gait.  Overall tolerated ambulation well but fatigues easily. Pt returned back to chair with call bell in reach.   Noel Christmas, RN 08/01/2017 2:54 PM

## 2017-08-01 NOTE — Evaluation (Signed)
Clinical/Bedside Swallow Evaluation Patient Details  Name: [name] MRN: [id number] Date of Birth: [birth year]  Today's Date: 08/01/2017 Time: SLP Start Time (ACUTE ONLY): 0941 SLP Stop Time (ACUTE ONLY): 0959 SLP Time Calculation (min) (ACUTE ONLY): 18 min  Past Medical History:  Past Medical History:  Diagnosis Date  . Anxiety   . Breast cancer ([place])   . Breast cancer of lower-outer quadrant of left female breast ([place]) 10/11/2014  . GERD (gastroesophageal reflux disease)   . Hypertension   . Personal history of radiation therapy    2016  . PONV (postoperative nausea and vomiting)   . PUD (peptic ulcer disease)   . Stone, kidney    Past Surgical History:  Past Surgical History:  Procedure Laterality Date  . BREAST LUMPECTOMY WITH SENTINEL LYMPH NODE BIOPSY Left 10/22/2014   Procedure: BREAST LUMPECTOMY WITH SENTINEL LYMPH NODE BIOPSY;  Surgeon: Autumn Messing III, MD;  Location: [facility];  Service: General;  Laterality: Left;  . CORONARY STENT INTERVENTION N/A 07/27/2017   Procedure: CORONARY STENT INTERVENTION;  Surgeon: Martinique, Peter M, MD;  Location: [facility];  Service: Cardiovascular;  Laterality: N/A;  . CORONARY/GRAFT ACUTE MI REVASCULARIZATION N/A 07/27/2017   Procedure: Coronary/Graft Acute MI Revascularization;  Surgeon: Martinique, Peter M, MD;  Location: [facility] [facility];  Service: Cardiovascular;  Laterality: N/A;  . LEFT HEART CATH AND CORONARY ANGIOGRAPHY N/A 07/27/2017   Procedure: LEFT HEART CATH AND CORONARY ANGIOGRAPHY;  Surgeon: Martinique, Peter M, MD;  Location: [facility];  Service: Cardiovascular;  Laterality: N/A;  . RE-EXCISION OF BREAST CANCER,SUPERIOR MARGINS Left 11/04/2014   Procedure: RE-EXCISION OF LEFT BREAST INFERIOR MARGINS;  Surgeon: Autumn Messing III, MD;  Location: [facility];  Service: General;  Laterality: Left;   HPI:  Pt is a 83 y.o. female with PMH of breast CA, GERD, HTN, HLD, who presents 07/27/17 with acute onset of chest  pain; ECG consistent with STEMI. S/p PCI with stent 2/23; pt with brief bradycardic arrest requiring intubation 2/23-2/24. Chest CT trace bilateral pleural effusions, diffuse bilateral interstitial thickening and patchy areas of alveolar airspace disease. Differential considerations include pulmonary edema versus less likely multilobar pneumonia CXR showing pulmonary edema.   Assessment / Plan / Recommendation Clinical Impression  Pt reports slight difficulty swallowing post extubation with globus pharyngeal sensation and mild difficulty with pills. Effortful swallow with most swallows, no cough, throat clear or alterated vocalizations. Intubation was short term (2day). Observed with pills and unable to transit Potassium pill with thin liquid improved with vanilla pudding (dislikes applesauce). Discussed pt's GERD with daughter who reports pt does not take reflux meds. Recommend regular diet with pt and family ordering softer textures if needed, thin liquids and pills in pudding. No follow up needed at this time.  SLP Visit Diagnosis: Dysphagia, pharyngeal phase (R13.13)    Aspiration Risk  Mild aspiration risk    Diet Recommendation Regular;Thin liquid   Liquid Administration via: Cup;Straw Medication Administration: Whole meds with puree Supervision: Patient able to self feed Compensations: Slow rate;Small sips/bites Postural Changes: Seated upright at 90 degrees;Remain upright for at least 30 minutes after po intake    Other  Recommendations Oral Care Recommendations: Oral care BID   Follow up Recommendations None      Frequency and Duration            Prognosis        Swallow Study   General HPI: Pt is a 83 y.o. female with PMH  of breast CA, GERD, HTN, HLD, who presents 07/27/17 with acute onset of chest pain; ECG consistent with STEMI. S/p PCI with stent 2/23; pt with brief bradycardic arrest requiring intubation 2/23-2/24. Chest CT trace bilateral pleural effusions, diffuse  bilateral interstitial thickening and patchy areas of alveolar airspace disease. Differential considerations include pulmonary edema versus less likely multilobar pneumonia CXR showing pulmonary edema. Type of Study: Bedside Swallow Evaluation Previous Swallow Assessment: (none) Diet Prior to this Study: Thin liquids(clears) Temperature Spikes Noted: No Respiratory Status: Nasal cannula History of Recent Intubation: Yes Length of Intubations (days): 2 days Date extubated: 07/31/17 Behavior/Cognition: Alert;Cooperative;Pleasant mood Oral Cavity Assessment: Within Functional Limits Oral Care Completed by SLP: No Oral Cavity - Dentition: Adequate natural dentition Vision: Functional for self-feeding Self-Feeding Abilities: Able to feed self Patient Positioning: Upright in bed Baseline Vocal Quality: Normal Volitional Cough: Strong Volitional Swallow: Able to elicit    Oral/Motor/Sensory Function Overall Oral Motor/Sensory Function: Within functional limits   Ice Chips Ice chips: Not tested   Thin Liquid Thin Liquid: Impaired Presentation: Cup;Straw Pharyngeal  Phase Impairments: Other (comments)(effortful swallow)    Nectar Thick Nectar Thick Liquid: Not tested   Honey Thick Honey Thick Liquid: Not tested   Puree Puree: Impaired Presentation: Self Fed;Spoon Pharyngeal Phase Impairments: (effortful swallow)   Solid   GO   Solid: Impaired Pharyngeal Phase Impairments: (effortful swallow)        Mick Sell, Orbie Pyo 08/01/2017,10:12 AM  lumbosacral

## 2017-08-01 NOTE — Progress Notes (Addendum)
Advanced Heart Failure Rounding Note  PCP-Cardiologist: No primary care provider on file.   Subjective:    Coox 59.3%. Not on pressor support. Converted to NSR on IV amiodarone.   Started on Rocephin 07/31/17 with UTI.   Feeling tired this am. Slept poorly. Got runs of K overnight. Denies SOB on 5 L via HFNC.   K 2.7 late last night, and received 8 runs of K overnight. Creatinine 1.63.   CT chest 07/31/17 with diffuse bilateral interstitial thickening and patchy areas of alveolar space disease. Reviewed CT with pulmonary and radiology. Most likely edema that is clearing.   Objective:   Weight Range: 171 lb 1.2 oz (77.6 kg) Body mass index is 26.01 kg/m.   Vital Signs:   Temp:  [97.7 F (36.5 C)-98.5 F (36.9 C)] 98 F (36.7 C) (02/28 0417) Pulse Rate:  [72-132] 90 (02/28 0700) Resp:  [22-40] 39 (02/28 0700) BP: (100-144)/(53-96) 136/69 (02/28 0700) SpO2:  [93 %-100 %] 97 % (02/28 0700) Weight:  [171 lb 1.2 oz (77.6 kg)] 171 lb 1.2 oz (77.6 kg) (02/28 0600) Last BM Date: 07/31/17  Weight change: Filed Weights   07/30/17 0645 07/31/17 0630 08/01/17 0600  Weight: 167 lb 5.3 oz (75.9 kg) 166 lb 0.1 oz (75.3 kg) 171 lb 1.2 oz (77.6 kg)    Intake/Output:   Intake/Output Summary (Last 24 hours) at 08/01/2017 0741 Last data filed at 08/01/2017 0700 Gross per 24 hour  Intake 2322.8 ml  Output 1955 ml  Net 367.8 ml      Physical Exam    General: Elderly appearing. NAD. Lying in bed HEENT: Normal Neck: Supple. JVP not elevated. Carotids 2+ bilat; no bruits. No thyromegaly or nodule noted. Cor: PMI nondisplaced. Irregularly irregular. 2/6 MR.  Lungs: Crackles throughout. No wheeze.  Abdomen: Soft, non-tender, non-distended, no HSM. No bruits or masses. +BS  Extremities: No cyanosis, clubbing, or rash. R and LLE no edema. Warm Neuro: Alert & orientedx3, cranial nerves grossly intact. moves all 4 extremities w/o difficulty. Affect flat  Telemetry   Afib 70-80s,  personally reviewed.   EKG    No new tracings.    Labs    CBC Recent Labs    07/31/17 0511 08/01/17 0320  WBC 10.8* 11.7*  HGB 10.2* 10.1*  HCT 31.2* 31.2*  MCV 90.4 89.7  PLT 218 045   Basic Metabolic Panel Recent Labs    07/31/17 0511 07/31/17 2108  NA 141 138  K 2.7* 2.7*  CL 105 104  CO2 25 22  GLUCOSE 112* 137*  BUN 38* 41*  CREATININE 1.55* 1.63*  CALCIUM 9.2 8.7*  MG  --  1.8   Liver Function Tests Recent Labs    07/31/17 0511  AST 95*  ALT 80*  ALKPHOS 58  BILITOT 1.2  PROT 6.2*  ALBUMIN 2.6*   No results for input(s): LIPASE, AMYLASE in the last 72 hours. Cardiac Enzymes No results for input(s): CKTOTAL, CKMB, CKMBINDEX, TROPONINI in the last 72 hours.  BNP: BNP (last 3 results) No results for input(s): BNP in the last 8760 hours.  ProBNP (last 3 results) No results for input(s): PROBNP in the last 8760 hours.   D-Dimer No results for input(s): DDIMER in the last 72 hours. Hemoglobin A1C No results for input(s): HGBA1C in the last 72 hours. Fasting Lipid Panel No results for input(s): CHOL, HDL, LDLCALC, TRIG, CHOLHDL, LDLDIRECT in the last 72 hours. Thyroid Function Tests No results for input(s): TSH, T4TOTAL, T3FREE, THYROIDAB  in the last 72 hours.  Invalid input(s): FREET3  Other results:   Imaging    Ct Chest Wo Contrast  Result Date: 07/31/2017 CLINICAL DATA:  Shortness of breath, nonproductive cough, no chest pain EXAM: CT CHEST WITHOUT CONTRAST TECHNIQUE: Multidetector CT imaging of the chest was performed following the standard protocol without IV contrast. COMPARISON:  07/31/2017 FINDINGS: Cardiovascular: No significant vascular findings. Mild cardiomegaly. Coronary artery atherosclerosis involving the left main, lad and circumflex. No pericardial effusion. Normal caliber thoracic aorta. Thoracic aortic atherosclerosis. Mediastinum/Nodes: No enlarged mediastinal or axillary lymph nodes. Thyroid gland, trachea, and esophagus  demonstrate no significant findings. Lungs/Pleura: Trace bilateral pleural effusions. Bilateral interstitial thickening diffusely throughout bilateral lungs. Bilateral patchy areas of alveolar airspace disease in the upper and lower lobes. Upper Abdomen: No acute upper abdominal abnormality. Dystrophic calcification in the dome of the liver. Musculoskeletal: No acute osseous abnormality. No aggressive osseous lesion. Degenerative disease with disc height loss throughout the thoracic spine. IMPRESSION: 1. Trace bilateral pleural effusions, diffuse bilateral interstitial thickening and patchy areas of alveolar airspace disease. Differential considerations include pulmonary edema versus less likely multilobar pneumonia. 2.  Aortic Atherosclerosis (ICD10-I70.0). 3. Coronary artery atherosclerosis. Electronically Signed   By: Kathreen Devoid   On: 07/31/2017 11:56     Medications:     Scheduled Medications: . anastrozole  1 mg Oral Daily  . apixaban  2.5 mg Oral BID  . aspirin  81 mg Oral Daily  . atorvastatin  80 mg Oral q1800  . Chlorhexidine Gluconate Cloth  6 each Topical Daily  . clopidogrel  75 mg Oral Daily  . insulin aspart  2-6 Units Subcutaneous Q4H  . magnesium gluconate  500 mg Oral BID  . mouth rinse  15 mL Mouth Rinse BID  . multivitamin  15 mL Oral Daily  . omega-3 acid ethyl esters  1 g Oral Daily  . pantoprazole (PROTONIX) IV  40 mg Intravenous Q24H  . potassium chloride  40 mEq Oral BID  . sodium chloride flush  10-40 mL Intracatheter Q12H  . sodium chloride flush  3 mL Intravenous Q12H  . sodium chloride flush  3 mL Intravenous Q12H  . spironolactone  12.5 mg Oral Daily  . vitamin C  1,000 mg Oral Daily    Infusions: . sodium chloride 250 mL (07/31/17 1900)  . sodium chloride    . amiodarone 30 mg/hr (08/01/17 0700)  . cefTRIAXone (ROCEPHIN)  IV Stopped (07/31/17 1837)    PRN Medications: sodium chloride, sodium chloride, acetaminophen, ondansetron (ZOFRAN) IV, sodium  chloride flush, sodium chloride flush, sodium chloride flush, sorbitol  Patient Profile   EDIE VALLANDINGHAM is a 83 y.o. female with h/o HTN, osteoporosis, L breast cancer s/p XRT and chemo and definitive surgery, HLD, and pre diabetes.    She presented to ED 07/27/17 with CP. Found to have acute anterior MI and taken to cath lab. [place] 07/27/17 as below with 3v CAD, LVEF 35-40%, elevated LVEDP. Underwent PCI with successful stenting of the ramus intermediate artery with DES x 2.  Had bradycardic arrest on arrival to ICU with epi/atropine and brief CPR.   Assessment/Plan   1. Cardiogenic shock - in setting of Anterior MI/STEMI - now s/p DES x 2 to ramus intermediate artery. Extubated 07/28/17 - Coox marginal at 51.8% this am. Continue to follow.   2. Acute respiratory failure - Initially required intubation.  - Extubated to BiPAP 07/28/17.  - Much improved. Now on HFNC at 5 lpm. Weaning as tolerated.  -  CT chest 07/31/17 with diffuse bilateral interstitial thickening and patchy areas of alveolar space disease. Reviewed CT with pulmonary and radiology. Most likely edema that is clearing.   3. 3v CAD with STEMI, s/p 2x DES to ramus intermediate - Continue ASA and high dose statin at atorvastatin 80 mg daily. - Discussed with Dr. Martinique personally. Have changed brilinta to Plavix.  - Will need DAPT for one year.  - Now on Eliquis.  - Cardiac rehab to see.   4. Bradycardic arrest - Occurred on arrival to unit post cath.  - Holding BB. Follow closely on amiodarone - Had several repeat episodes of bradycardia into 30-40s 07/29/17. BB stopped as above.  - No further bradycardia.   5. Acute systolic CHF 2/2 ischemic cardiomyopathy - Echo 07/28/17 LVEF 40-45%, Mild AI, Mod MR, Mod LAE, Mod TR, PA peak pressure 65 mm Hg. Thick appearing ventricle  - Volume status improved, still with crackles. - CVP 3-4 this am. Will transition to po lasix this afternoon.  - Stop BB with cardiogenic shock.  -  Continue  spiro 12.5 mg daily.  - Add losartan  6. New Afib with RVR - No previous history in chart. Noted overnight 07/28/17. - Continue amiodarone gtt. (watch HR closely) - This patients CHA2DS2-VASc is at least 6.   - She is now in NSR.  Cancel DCCV.  OK for her to eat.  - Continue Eliquis 2.5 mg BID.   7. H/o L breast cancer - Treated by Dr. [name] - Had radiation 7-01/2015, followed by anastrozole and denosumab/Prolia q 6 months.  - Plan to continue anastrozole for total of 5 years per Dr. [name] note.  - Monitoring for osteoporosis. No change.   8. Hematuria - UA with + UTI.  - Now on rocephin for UTI.   9. Hypokalemia - Repeat BMET this am 4.0 after 8 runs of K overnight.  - Supp Mg.   Medication concerns reviewed with patient and pharmacy team. Barriers identified: None at this time.   Length of Stay: 5  Annamaria Helling  08/01/2017, 7:41 AM  Advanced Heart Failure Team Pager [phone] (M-F; 7a - 4p)  Please contact [facility] for night-coverage after hours (4p -7a ) and weekends on [email or web address]  Patient seen and examined with the above-signed Advanced Practice Provider and/or Housestaff. I personally reviewed laboratory data, imaging studies and relevant notes. I independently examined the patient and formulated the important aspects of the plan. I have edited the note to reflect any of my changes or salient points. I have personally discussed the plan with the patient and/or family.  Remains fatigued but is making slow progress. Maintaining NSR on IV amio. Co-ox coming up slowly. Oxygenation improving with diuresis. Now has passed swallow study and po intake increasing. On rocephin for UTI. Will adjust meds as above. Begin to ambulate. Repeat CXR in am. Can go to SDU.  Add losartan.  Glori Bickers, MD  11:37 PM

## 2017-08-01 NOTE — Discharge Instructions (Signed)

## 2017-08-02 ENCOUNTER — Inpatient Hospital Stay (HOSPITAL_COMMUNITY): Payer: Medicare Other

## 2017-08-02 LAB — GLUCOSE, CAPILLARY
GLUCOSE-CAPILLARY: 102 mg/dL — AB (ref 65–99)
GLUCOSE-CAPILLARY: 159 mg/dL — AB (ref 65–99)
GLUCOSE-CAPILLARY: 138 mg/dL — AB (ref 65–99)
Glucose-Capillary: 108 mg/dL — ABNORMAL HIGH (ref 65–99)
Glucose-Capillary: 113 mg/dL — ABNORMAL HIGH (ref 65–99)

## 2017-08-02 LAB — CBC
HEMATOCRIT: 29.8 % — AB (ref 36.0–46.0)
Hemoglobin: 9.6 g/dL — ABNORMAL LOW (ref 12.0–15.0)
MCH: 29.3 pg (ref 26.0–34.0)
MCHC: 32.2 g/dL (ref 30.0–36.0)
MCV: 90.9 fL (ref 78.0–100.0)
PLATELETS: 229 10*3/uL (ref 150–400)
RBC: 3.28 MIL/uL — ABNORMAL LOW (ref 3.87–5.11)
RDW: 14.1 % (ref 11.5–15.5)
WBC: 8.6 10*3/uL (ref 4.0–10.5)

## 2017-08-02 LAB — BASIC METABOLIC PANEL
Anion gap: 11 (ref 5–15)
BUN: 37 mg/dL — ABNORMAL HIGH (ref 6–20)
CALCIUM: 9.3 mg/dL (ref 8.9–10.3)
CO2: 21 mmol/L — AB (ref 22–32)
Chloride: 109 mmol/L (ref 101–111)
Creatinine, Ser: 1.48 mg/dL — ABNORMAL HIGH (ref 0.44–1.00)
GFR calc non Af Amer: 32 mL/min — ABNORMAL LOW (ref >60)
GFR, EST AFRICAN AMERICAN: 37 mL/min — AB (ref >60)
GLUCOSE: 113 mg/dL — AB (ref 65–99)
POTASSIUM: 3.9 mmol/L (ref 3.5–5.1)
Sodium: 141 mmol/L (ref 135–145)

## 2017-08-02 LAB — MAGNESIUM: Magnesium: 2.3 mg/dL (ref 1.7–2.4)

## 2017-08-02 LAB — COOXEMETRY PANEL
CARBOXYHEMOGLOBIN: 0.9 % (ref 0.5–1.5)
Methemoglobin: 1.2 % (ref 0.0–1.5)
O2 Saturation: 53.6 %
Total hemoglobin: 10.6 g/dL — ABNORMAL LOW (ref 12.0–16.0)

## 2017-08-02 LAB — PROCALCITONIN: Procalcitonin: 0.12 ng/mL

## 2017-08-02 MED ORDER — ORAL CARE MOUTH RINSE
15.0000 mL | Freq: Two times a day (BID) | OROMUCOSAL | Status: DC
  Administered 2017-08-02 – 2017-08-07 (×4): 15 mL via OROMUCOSAL

## 2017-08-02 MED ORDER — SODIUM CHLORIDE 0.9% FLUSH
3.0000 mL | Freq: Two times a day (BID) | INTRAVENOUS | Status: DC
  Administered 2017-08-02 – 2017-08-07 (×6): 3 mL via INTRAVENOUS

## 2017-08-02 MED ORDER — LEVOFLOXACIN IN D5W 250 MG/50ML IV SOLN
250.0000 mg | INTRAVENOUS | Status: DC
  Filled 2017-08-02: qty 50

## 2017-08-02 MED ORDER — SODIUM CHLORIDE 0.9% FLUSH: 3.0000 mL | INTRAVENOUS | Status: DC | PRN

## 2017-08-02 MED ORDER — PANTOPRAZOLE SODIUM 40 MG PO TBEC
40.0000 mg | DELAYED_RELEASE_TABLET | Freq: Every day | ORAL | Status: DC
  Administered 2017-08-02 – 2017-08-07 (×6): 40 mg via ORAL
  Filled 2017-08-02 (×6): qty 1

## 2017-08-02 MED ORDER — SODIUM CHLORIDE 0.9 % IV SOLN: 250.0000 mL | INTRAVENOUS | Status: DC

## 2017-08-02 MED ORDER — SPIRONOLACTONE 25 MG PO TABS
25.0000 mg | ORAL_TABLET | Freq: Every day | ORAL | Status: DC
  Administered 2017-08-02 – 2017-08-07 (×6): 25 mg via ORAL
  Filled 2017-08-02 (×6): qty 1

## 2017-08-02 MED ORDER — AMIODARONE LOAD VIA INFUSION
150.0000 mg | Freq: Once | INTRAVENOUS | Status: AC
Start: 2017-08-02 — End: 2017-08-02
  Administered 2017-08-02: 150 mg via INTRAVENOUS
  Filled 2017-08-02: qty 83.34

## 2017-08-02 MED ORDER — AMIODARONE LOAD VIA INFUSION
150.0000 mg | Freq: Once | INTRAVENOUS | Status: AC
  Administered 2017-08-02: 150 mg via INTRAVENOUS
  Filled 2017-08-02: qty 83.34

## 2017-08-02 MED ORDER — LEVOFLOXACIN IN D5W 500 MG/100ML IV SOLN
500.0000 mg | INTRAVENOUS | Status: AC
  Administered 2017-08-02 – 2017-08-06 (×5): 500 mg via INTRAVENOUS
  Filled 2017-08-02 (×5): qty 100

## 2017-08-02 NOTE — Progress Notes (Addendum)
  THIS NOTE PLACED ON WRONG PATIENT.  PLEASE DISREGARD.    Paged for SBP in 70-80s since turning dobutamine down. Asymptomatic.  Instructed to turn dobutamine back up to 3 mcg/kg/min and add midodrine 5 mg TID.   On my arrival pt SBP improved to 90s with increase of dobutamine. Remains asymptomatic. Sister and Nephew in room, briefly reviewed new information (Not surgical candidate) from yesterday.  They have no further questions at this time.   Hold in ICU for now to continue dobutamine titration.   Legrand Como [address]" Carpio, PA-C 08/02/2017 11:23 AM

## 2017-08-02 NOTE — Plan of Care (Signed)
Pt is progressing.  Less O2 needs. No bipap overnight. She still has crackles in upper lobes and diminished in lower lobes.

## 2017-08-02 NOTE — Progress Notes (Addendum)
Advanced Heart Failure Rounding Note  PCP-Cardiologist: No primary care provider on file.   Subjective:    Events 07/27/17 STEMI with DES x 2 to ramus intermediate artery 07/28/17 Brady-arrest on arrival to ICU require ACLS, CPR, and intubation 07/28/17 Extubated to BiPAP 07/31/17 Started on Rocephin with UTI.   Coox 53.6% this am. Not on pressor support.   Back into Afib ~ 0545 this am. (Coox drawn prior to this.)  Feeling better this am. Remains fatigued. Slight cough, no production.   K stable at 3.9 this am. Mg pending. Creatinine 1.48.  CT chest 07/31/17 with diffuse bilateral interstitial thickening and patchy areas of alveolar space disease. Reviewed CT with pulmonary and radiology. Most likely edema that is clearing.   Objective:   Weight Range: 167 lb 5.3 oz (75.9 kg) Body mass index is 25.44 kg/m.   Vital Signs:   Temp:  [97.5 F (36.4 C)-98.1 F (36.7 C)] 97.9 F (36.6 C) (03/01 0435) Pulse Rate:  [80-102] 102 (03/01 0600) Resp:  [20-32] 24 (03/01 0600) BP: (83-154)/(45-88) 154/88 (03/01 0600) SpO2:  [94 %-100 %] 95 % (03/01 0600) Weight:  [167 lb 5.3 oz (75.9 kg)] 167 lb 5.3 oz (75.9 kg) (03/01 0500) Last BM Date: 08/01/17  Weight change: Filed Weights   07/31/17 0630 08/01/17 0600 08/02/17 0500  Weight: 166 lb 0.1 oz (75.3 kg) 171 lb 1.2 oz (77.6 kg) 167 lb 5.3 oz (75.9 kg)    Intake/Output:   Intake/Output Summary (Last 24 hours) at 08/02/2017 0734 Last data filed at 08/02/2017 0600 Gross per 24 hour  Intake 1069.1 ml  Output 1525 ml  Net -455.9 ml     Physical Exam    General: Elderly appearing. NAD.  HEENT: Normal Neck: Supple. JVP not elevated. Carotids 2+ bilat; no bruits. No thyromegaly or nodule noted. Cor: PMI nondisplaced. Irregularly irregular. 2/6 MR Lungs: Crackles throughout. Somewhat improved.  Abdomen: Soft, non-tender, non-distended, no HSM. No bruits or masses. +BS  Extremities: No cyanosis, clubbing, or rash. R and LLE no  edema. Warm to the touch.  Neuro: Alert & orientedx3, cranial nerves grossly intact. moves all 4 extremities w/o difficulty. Affect flat but appropriate.   Telemetry   NSR 80s up until ~ 0545 this am, Now in Afib 100-110s, personally reviewed.   EKG    No new tracings.  Labs    CBC Recent Labs    08/01/17 0320 08/02/17 0457  WBC 11.7* 8.6  HGB 10.1* 9.6*  HCT 31.2* 29.8*  MCV 89.7 90.9  PLT 254 193   Basic Metabolic Panel Recent Labs    07/31/17 2108 08/01/17 0915 08/02/17 0457  NA 138 138 141  K 2.7* 4.0 3.9  CL 104 105 109  CO2 22 21* 21*  GLUCOSE 137* 125* 113*  BUN 41* 39* 37*  CREATININE 1.63* 1.65* 1.48*  CALCIUM 8.7* 9.0 9.3  MG 1.8  --   --    Liver Function Tests Recent Labs    07/31/17 0511  AST 95*  ALT 80*  ALKPHOS 58  BILITOT 1.2  PROT 6.2*  ALBUMIN 2.6*   No results for input(s): LIPASE, AMYLASE in the last 72 hours. Cardiac Enzymes No results for input(s): CKTOTAL, CKMB, CKMBINDEX, TROPONINI in the last 72 hours.  BNP: BNP (last 3 results) No results for input(s): BNP in the last 8760 hours.  ProBNP (last 3 results) No results for input(s): PROBNP in the last 8760 hours.   D-Dimer No results for input(s): DDIMER  in the last 72 hours. Hemoglobin A1C No results for input(s): HGBA1C in the last 72 hours. Fasting Lipid Panel No results for input(s): CHOL, HDL, LDLCALC, TRIG, CHOLHDL, LDLDIRECT in the last 72 hours. Thyroid Function Tests No results for input(s): TSH, T4TOTAL, T3FREE, THYROIDAB in the last 72 hours.  Invalid input(s): FREET3  Other results:   Imaging   No results found.  Medications:     Scheduled Medications: . anastrozole  1 mg Oral Daily  . apixaban  2.5 mg Oral BID  . aspirin  81 mg Oral Daily  . atorvastatin  80 mg Oral q1800  . Chlorhexidine Gluconate Cloth  6 each Topical Daily  . clopidogrel  75 mg Oral Daily  . furosemide  60 mg Oral Daily  . insulin aspart  2-6 Units Subcutaneous Q4H  .  losartan  25 mg Oral QHS  . magnesium gluconate  500 mg Oral BID  . mouth rinse  15 mL Mouth Rinse BID  . multivitamin with minerals  1 tablet Oral Daily  . omega-3 acid ethyl esters  1 g Oral Daily  . pantoprazole (PROTONIX) IV  40 mg Intravenous Q24H  . potassium chloride  40 mEq Oral BID  . sodium chloride flush  10-40 mL Intracatheter Q12H  . sodium chloride flush  3 mL Intravenous Q12H  . sodium chloride flush  3 mL Intravenous Q12H  . spironolactone  12.5 mg Oral Daily  . vitamin C  1,000 mg Oral Daily    Infusions: . sodium chloride 250 mL (08/01/17 1900)  . sodium chloride    . amiodarone 30 mg/hr (08/01/17 1900)  . cefTRIAXone (ROCEPHIN)  IV Stopped (08/01/17 1847)    PRN Medications: sodium chloride, sodium chloride, acetaminophen, ondansetron (ZOFRAN) IV, sodium chloride flush, sodium chloride flush, sodium chloride flush, sorbitol  Patient Profile   [name] is a 83 y.o. female with h/o HTN, osteoporosis, L breast cancer s/p XRT and chemo and definitive surgery, HLD, and pre diabetes.    She presented to ED 07/27/17 with CP. Found to have acute anterior MI and taken to cath lab. [place] 07/27/17 as below with 3v CAD, LVEF 35-40%, elevated LVEDP. Underwent PCI with successful stenting of the ramus intermediate artery with DES x 2.  Had bradycardic arrest on arrival to ICU with epi/atropine and brief CPR.   Assessment/Plan   1. Cardiogenic shock - in setting of Anterior MI/STEMI - now s/p DES x 2 to ramus intermediate artery. Extubated 07/28/17 - Coox marginal at 53.8% this am. Poor candidate for home inotrope support with age and arrhythmias.   2. Acute respiratory failure - Initially required intubation.  - Extubated to BiPAP 07/28/17.  - continues to improve, but remains on HFNC at 4 lpm. Weaning as tolerated.  - CT chest 07/31/17 with diffuse bilateral interstitial thickening and patchy areas of alveolar space disease. Reviewed CT with pulmonary and radiology.  Most likely edema that is clearing.   3. 3v CAD with STEMI, s/p 2x DES to ramus intermediate - Continue ASA and high dose statin at atorvastatin 80 mg daily. - Discussed with Dr. Martinique personally. Have changed brilinta to Plavix.  - Will need DAPT for one year.  - Now on Eliquis.  - Cardiac rehab to see.   4. Bradycardic arrest - Occurred on arrival to unit post cath.  - Holding BB. Follow closely on amiodarone - Had several repeat episodes of bradycardia into 30-40s 07/29/17. BB stopped as above.  - No further bradycardia.  5. Acute systolic CHF 2/2 ischemic cardiomyopathy - Echo 07/28/17 LVEF 40-45%, Mild AI, Mod MR, Mod LAE, Mod TR, PA peak pressure 65 mm Hg. Thick appearing ventricle  - Volume status improved, still with crackles. - CVP 3 this am. Continue po lasix 60 mg daily.  - Stop BB with cardiogenic shock.  - Increase spiro 25 mg daily.   - Continue losartan 25 mg daily.   6. New Afib with RVR - No previous history in chart. Noted overnight 07/28/17. - Back into afib overnight.  - Re-bolus amiodarone gtt. May need to re-consider DCCV if does not convert.  - This patients CHA2DS2-VASc is at least 6.   - Continue Eliquis 2.5 mg BID.   7. H/o L breast cancer - Treated by Dr. [name] - Had radiation 7-01/2015, followed by anastrozole and denosumab/Prolia q 6 months.  - Plan to continue anastrozole for total of 5 years per Dr. [name] note.  - Monitoring for osteoporosis. No change.  8. Hematuria - Now on rocephin for UTI.  - Foley out.   9. Hypokalemia - K 3.9. Mg pending.   Medication concerns reviewed with patient and pharmacy team. Barriers identified: None at this time.   Length of Stay: [address]  Annamaria Helling  08/02/2017, 7:34 AM  Advanced Heart Failure Team Pager [phone] (M-F; 7a - 4p)  Please contact [facility] for night-coverage after hours (4p -7a ) and weekends on [email or web address]  Patient seen and examined with the above-signed Advanced  Practice Provider and/or Housestaff. I personally reviewed laboratory data, imaging studies and relevant notes. I independently examined the patient and formulated the important aspects of the plan. I have edited the note to reflect any of my changes or salient points. I have personally discussed the plan with the patient and/or family.  Remains very tenuous. Feels weak. No CP or SOB. Back in AF with RVR despite IV amio. CVP 3-4. Weight stable on po lasix. CXR with worsening airspace disease in upper lobes (Personally reviewed). Will switch ceftriaxone (on for UTI) to Levaquin. Will check PCT.   Continue amio and PT. Can move to SDU.   Glori Bickers, MD  8:19 AM

## 2017-08-02 NOTE — Progress Notes (Signed)
Physical Therapy Treatment Patient Details Name: [name] MRN: [id number] DOB: 02/01/1935 Today's Date: 08/02/2017    History of Present Illness Pt is a 83 y.o. female with PMH of breast CA, HTN, HLD, who presents 07/27/17 with acute onset of chest pain; ECG consistent with STEMI. S/p PCI with stent 2/23; pt with brief bradycardic arrest requiring intubation 2/23-2/24. CXR showing pulmonary edema.    PT Comments    Pt progressing well with mobility. Able to amb 300' with RW and intermittent min guard for balance; stability much improved with cues to limit head turns and focus on looking forward while ambulating. 1x standing rest break secondary to BLE fatigue. SpO2 >88% on RA throughout (RN notified). Pt remains motivated to return home. Will follow acutely.   Follow Up Recommendations  Home health PT     Equipment Recommendations  Rolling walker with 5" wheels    Recommendations for Other Services       Precautions / Restrictions Precautions Precautions: Fall Restrictions Weight Bearing Restrictions: No    Mobility  Bed Mobility               General bed mobility comments: Received sitting in recliner  Transfers Overall transfer level: Needs assistance Equipment used: Rolling walker (2 wheeled);None Transfers: Sit to/from Stand Sit to Stand: Supervision         General transfer comment: Able to stand from recliner with and with RW and supervision for safety; educ on safe hand placement on RW  Ambulation/Gait Ambulation/Gait assistance: Min guard Ambulation Distance (Feet): 300 Feet Assistive device: Rolling walker (2 wheeled) Gait Pattern/deviations: Step-through pattern;Decreased stride length;Narrow base of support Gait velocity: Decreased Gait velocity interpretation: <1.8 ft/sec, indicative of risk for recurrent falls General Gait Details: Slow, slightly unsteady amb with RW and intermittent min guard for balance. Pt tends to look around which causes  her to drift R/L, reports aware of this with good ability to self-correct. 1x standing rest break secondary to fatigue. SpO2 >88% on RA   Stairs            Wheelchair Mobility    Modified Rankin (Stroke Patients Only)       Balance Overall balance assessment: Needs assistance   Sitting balance-Leahy Scale: Good       Standing balance-Leahy Scale: Fair Standing balance comment: Can static stand with no UE; dynamic stability improved with RW                            Cognition Arousal/Alertness: Awake/alert Behavior During Therapy: WFL for tasks assessed/performed Overall Cognitive Status: Within Functional Limits for tasks assessed                                        Exercises      General Comments General comments (skin integrity, edema, etc.): Husband and son present during session      Pertinent Vitals/Pain Pain Assessment: No/denies pain    Home Living                      Prior Function            PT Goals (current goals can now be found in the care plan section) Acute Rehab PT Goals Patient Stated Goal: Return home PT Goal Formulation: With patient Time For Goal Achievement: 08/14/17 Potential to Achieve  Goals: Good Progress towards PT goals: Progressing toward goals    Frequency    Min 3X/week      PT Plan Current plan remains appropriate    Co-evaluation              AM-PAC PT "6 Clicks" Daily Activity  Outcome Measure  Difficulty turning over in bed (including adjusting bedclothes, sheets and blankets)?: None Difficulty moving from lying on back to sitting on the side of the bed? : None Difficulty sitting down on and standing up from a chair with arms (e.g., wheelchair, bedside commode, etc,.)?: A Little Help needed moving to and from a bed to chair (including a wheelchair)?: A Little Help needed walking in hospital room?: A Little Help needed climbing 3-5 steps with a railing? : A  Little 6 Click Score: 20    End of Session Equipment Utilized During Treatment: Gait belt Activity Tolerance: Patient tolerated treatment well Patient left: in chair;with call bell/phone within reach;with nursing/sitter in room Nurse Communication: Mobility status PT Visit Diagnosis: Other abnormalities of gait and mobility (R26.89)     Time: 4827-0786 PT Time Calculation (min) (ACUTE ONLY): 16 min  Charges:  $Gait Training: 8-22 mins                    G Codes:      Mabeline Caras, PT, DPT Acute Rehab Services  Pager: [place] 08/02/2017, 5:03 PM

## 2017-08-02 NOTE — Progress Notes (Signed)
Patient provided with 30 day Eliquis card. Discussed HH. Patient would like to use [facility], patient will need 3/1 and RW at home. Patient will need HH orders, DME orders, and referrals placed.

## 2017-08-02 NOTE — Progress Notes (Signed)
CARDIAC REHAB PHASE I   PRE:  Rate/Rhythm: 122 A Fib  BP:  Sitting: 117/79     SaO2: 98% 3L  MODE:  Ambulation: 190 ft   POST:  Rate/Rhythm: 133 A Fib  BP:  Sitting: 147/98     SaO2: 97% 3L   1343-1447 Pt assisted off of BSC and ambulated 162ft with RW on 3L O2. SOB noted upon standing and with walk. Pt endorsed SOB with walk. Steady gait today. Stopped twice to rest. MI education began.  MI and Off the Beat books given to patient. Will continue to follow.   Noel Christmas, RN 08/02/2017 2:43 PM

## 2017-08-03 DIAGNOSIS — I5021 Acute systolic (congestive) heart failure: Secondary | ICD-10-CM

## 2017-08-03 LAB — CBC
HCT: 29 % — ABNORMAL LOW (ref 36.0–46.0)
Hemoglobin: 9.3 g/dL — ABNORMAL LOW (ref 12.0–15.0)
MCH: 28.9 pg (ref 26.0–34.0)
MCHC: 32.1 g/dL (ref 30.0–36.0)
MCV: 90.1 fL (ref 78.0–100.0)
PLATELETS: 249 10*3/uL (ref 150–400)
RBC: 3.22 MIL/uL — AB (ref 3.87–5.11)
RDW: 13.9 % (ref 11.5–15.5)
WBC: 8.4 10*3/uL (ref 4.0–10.5)

## 2017-08-03 LAB — BASIC METABOLIC PANEL
Anion gap: 10 (ref 5–15)
BUN: 28 mg/dL — AB (ref 6–20)
CALCIUM: 9.4 mg/dL (ref 8.9–10.3)
CO2: 20 mmol/L — AB (ref 22–32)
CREATININE: 1.35 mg/dL — AB (ref 0.44–1.00)
Chloride: 109 mmol/L (ref 101–111)
GFR calc Af Amer: 42 mL/min — ABNORMAL LOW (ref >60)
GFR calc non Af Amer: 36 mL/min — ABNORMAL LOW (ref >60)
GLUCOSE: 123 mg/dL — AB (ref 65–99)
Potassium: 4.5 mmol/L (ref 3.5–5.1)
Sodium: 139 mmol/L (ref 135–145)

## 2017-08-03 LAB — COOXEMETRY PANEL
CARBOXYHEMOGLOBIN: 0.9 % (ref 0.5–1.5)
Methemoglobin: 1.1 % (ref 0.0–1.5)
O2 SAT: 56.3 %
Total hemoglobin: 11.2 g/dL — ABNORMAL LOW (ref 12.0–16.0)

## 2017-08-03 LAB — GLUCOSE, CAPILLARY
GLUCOSE-CAPILLARY: 172 mg/dL — AB (ref 65–99)
GLUCOSE-CAPILLARY: 108 mg/dL — AB (ref 65–99)
GLUCOSE-CAPILLARY: 110 mg/dL — AB (ref 65–99)
Glucose-Capillary: 114 mg/dL — ABNORMAL HIGH (ref 65–99)

## 2017-08-03 MED ORDER — FUROSEMIDE 10 MG/ML IJ SOLN
60.0000 mg | Freq: Once | INTRAMUSCULAR | Status: AC
  Administered 2017-08-03: 60 mg via INTRAVENOUS
  Filled 2017-08-03: qty 6

## 2017-08-03 MED ORDER — LOSARTAN POTASSIUM 25 MG PO TABS
25.0000 mg | ORAL_TABLET | Freq: Two times a day (BID) | ORAL | Status: DC
  Administered 2017-08-03 – 2017-08-07 (×9): 25 mg via ORAL
  Filled 2017-08-03 (×9): qty 1

## 2017-08-03 MED ORDER — FUROSEMIDE 80 MG PO TABS
80.0000 mg | ORAL_TABLET | Freq: Every day | ORAL | Status: DC
  Administered 2017-08-04: 80 mg via ORAL
  Filled 2017-08-03: qty 1

## 2017-08-03 NOTE — Progress Notes (Signed)
CARDIAC REHAB PHASE I   PRE:  Rate/Rhythm: 90 Afib  BP:  Sitting: 125/67p       SaO2: 100 2L  MODE:  Ambulation: 290 ft   POST:  Rate/Rhythm: 103 Afib  BP:  Sitting: 126/71     SaO2: 100 2L 1350-1430 Prior to ambulation patient helped off and on bed side potty. Patient remained steady feet during transfer and denied complaints. Patient ambulated in hallway x 2 assist with gait belt and one to guide IV pole with O2. Patient able to hold good posture and have straight steady gait. She denied any dizziness or shortness of breath. Offered restbreaks but patient stated she was fine and did not need one. Son follow with chair for safety however not needed during this time of ambulation. She was able to talk during walk without dyspnea. Post ambulation patient to chair with call bell and phone in reach. Son remained at bedside. Patient encouraged to walk twice daily as tolerated. Will continue to follow.  Jonh Mcqueary English PayneRN, BSN 08/03/2017 2:50 PM

## 2017-08-03 NOTE — Progress Notes (Addendum)
Advanced Heart Failure Rounding Note  PCP-Cardiologist: No primary care provider on file.   Subjective:    Events 07/27/17 STEMI with DES x 2 to ramus intermediate artery 07/28/17 Brady-arrest on arrival to ICU require ACLS, CPR, and intubation 07/28/17 Extubated to BiPAP 07/31/17 Started on Rocephin with UTI.   Remains in AF 90-100. On IV amio. Feels weak but walked 300 feet with PT and rolling walker yesterday.   Co-ox 56% this am (improved). On levaquin for possible PNA. Weight climbing up. Creatinine improved.    CT chest 07/31/17 with diffuse bilateral interstitial thickening and patchy areas of alveolar space disease. Reviewed CT with pulmonary and radiology. Most likely edema that is clearing.   Objective:   Weight Range: 79 kg (174 lb 2.6 oz) Body mass index is 26.48 kg/m.   Vital Signs:   Temp:  [97.9 F (36.6 C)-98.9 F (37.2 C)] 98.2 F (36.8 C) (03/02 0755) Pulse Rate:  [28-140] 140 (03/02 0755) Resp:  [17-33] 19 (03/02 0755) BP: (101-170)/(60-144) 134/71 (03/02 0755) SpO2:  [86 %-100 %] 100 % (03/02 0755) Weight:  [79 kg (174 lb 2.6 oz)] 79 kg (174 lb 2.6 oz) (03/02 0400) Last BM Date: 08/03/17  Weight change: Filed Weights   08/01/17 0600 08/02/17 0500 08/03/17 0400  Weight: 77.6 kg (171 lb 1.2 oz) 75.9 kg (167 lb 5.3 oz) 79 kg (174 lb 2.6 oz)    Intake/Output:   Intake/Output Summary (Last 24 hours) at 08/03/2017 0949 Last data filed at 08/03/2017 0700 Gross per 24 hour  Intake 1117.2 ml  Output -  Net 1117.2 ml     Physical Exam    CVP 8  General:  Sitting in chair No resp difficulty HEENT: normal Neck: supple. JVP 8. Carotids 2+ bilat; no bruits. No lymphadenopathy or thryomegaly appreciated. Cor: PMI nondisplaced. IRR. No rubs, gallops or murmurs. Lungs: crackles at bases Abdomen: soft, nontender, nondistended. No hepatosplenomegaly. No bruits or masses. Good bowel sounds. Extremities: no cyanosis, clubbing, rash, edema Neuro: alert &  orientedx3, cranial nerves grossly intact. moves all 4 extremities w/o difficulty. Affect pleasant   Telemetry   Afib 90-110s, personally reviewed.   EKG    No new tracings.  Labs    CBC Recent Labs    08/02/17 0457 08/03/17 0427  WBC 8.6 8.4  HGB 9.6* 9.3*  HCT 29.8* 29.0*  MCV 90.9 90.1  PLT 229 295   Basic Metabolic Panel Recent Labs    07/31/17 2108  08/02/17 0457 08/02/17 0858 08/03/17 0427  NA 138   < > 141  --  139  K 2.7*   < > 3.9  --  4.5  CL 104   < > 109  --  109  CO2 22   < > 21*  --  20*  GLUCOSE 137*   < > 113*  --  123*  BUN 41*   < > 37*  --  28*  CREATININE 1.63*   < > 1.48*  --  1.35*  CALCIUM 8.7*   < > 9.3  --  9.4  MG 1.8  --   --  2.3  --    < > = values in this interval not displayed.   Liver Function Tests No results for input(s): AST, ALT, ALKPHOS, BILITOT, PROT, ALBUMIN in the last 72 hours. No results for input(s): LIPASE, AMYLASE in the last 72 hours. Cardiac Enzymes No results for input(s): CKTOTAL, CKMB, CKMBINDEX, TROPONINI in the last 72 hours.  BNP: BNP (last 3 results) No results for input(s): BNP in the last 8760 hours.  ProBNP (last 3 results) No results for input(s): PROBNP in the last 8760 hours.   D-Dimer No results for input(s): DDIMER in the last 72 hours. Hemoglobin A1C No results for input(s): HGBA1C in the last 72 hours. Fasting Lipid Panel No results for input(s): CHOL, HDL, LDLCALC, TRIG, CHOLHDL, LDLDIRECT in the last 72 hours. Thyroid Function Tests No results for input(s): TSH, T4TOTAL, T3FREE, THYROIDAB in the last 72 hours.  Invalid input(s): FREET3  Other results:   Imaging   No results found.  Medications:     Scheduled Medications: . anastrozole  1 mg Oral Daily  . apixaban  2.5 mg Oral BID  . aspirin  81 mg Oral Daily  . atorvastatin  80 mg Oral q1800  . Chlorhexidine Gluconate Cloth  6 each Topical Daily  . clopidogrel  75 mg Oral Daily  . furosemide  60 mg Oral Daily  .  insulin aspart  2-6 Units Subcutaneous Q4H  . losartan  25 mg Oral QHS  . magnesium gluconate  500 mg Oral BID  . mouth rinse  15 mL Mouth Rinse BID  . multivitamin with minerals  1 tablet Oral Daily  . omega-3 acid ethyl esters  1 g Oral Daily  . pantoprazole  40 mg Oral Daily  . potassium chloride  40 mEq Oral BID  . sodium chloride flush  10-40 mL Intracatheter Q12H  . sodium chloride flush  3 mL Intravenous Q12H  . sodium chloride flush  3 mL Intravenous Q12H  . sodium chloride flush  3 mL Intravenous Q12H  . spironolactone  25 mg Oral Daily  . vitamin C  1,000 mg Oral Daily    Infusions: . sodium chloride 250 mL (08/02/17 0800)  . sodium chloride    . sodium chloride    . amiodarone 30 mg/hr (08/03/17 0937)  . levofloxacin (LEVAQUIN) IV 500 mg (08/03/17 0937)    PRN Medications: sodium chloride, sodium chloride, acetaminophen, ondansetron (ZOFRAN) IV, sodium chloride flush, sodium chloride flush, sodium chloride flush, sodium chloride flush, sorbitol  Patient Profile   [name] is a 83 y.o. female with h/o HTN, osteoporosis, L breast cancer s/p XRT and chemo and definitive surgery, HLD, and pre diabetes.    She presented to ED 07/27/17 with CP. Found to have acute anterior MI and taken to cath lab. [place] 07/27/17 as below with 3v CAD, LVEF 35-40%, elevated LVEDP. Underwent PCI with successful stenting of the ramus intermediate artery with DES x 2.  Had bradycardic arrest on arrival to ICU with epi/atropine and brief CPR.   Assessment/Plan   1. Cardiogenic shock - in setting of Anterior MI/STEMI - now s/p DES x 2 to ramus intermediate artery. Extubated 07/28/17 - Coox slightly improved today at 56%. Will continue to follow. Functional status improving.   2. Acute respiratory failure - Initially required intubation.  - Extubated to BiPAP 07/28/17.  - Improving. Remains on [place] - CT chest 07/31/17 with diffuse bilateral interstitial thickening and patchy areas of alveolar  space disease. Reviewed CT with pulmonary and radiology. Most likely edema that is clearing.  - CXR on 3/1 suggestive of PNA. Abx expanded to Levaquin. PCT 0.12 - Will increase diuresis slightly today - Repeat CXR in am - Encouraged need for frequent IS  3. 3v CAD with STEMI, s/p 2x DES to ramus intermediate - Continue ASA and high dose statin at atorvastatin 80  mg daily. - Discussed with Dr. Martinique personally. Have changed brilinta to Plavix.  - Will need DAPT for one year.  - Now on Eliquis. No bleeding. Hgb stable  - Cardiac rehab to see.   4. Bradycardic arrest - Occurred on arrival to unit post cath.  - Holding BB. Follow closely on amiodarone - Had several repeat episodes of bradycardia into 30-40s 07/29/17. BB stopped as above.  - No further bradycardia.   5. Acute systolic CHF 2/2 ischemic cardiomyopathy - Echo 07/28/17 LVEF 40-45%, Mild AI, Mod MR, Mod LAE, Mod TR, PA peak pressure 65 mm Hg. Thick appearing ventricle  - Volume status  - CVP 3 this am. Continue po lasix 60 mg daily.  - Stop BB with cardiogenic shock.  - Increase spiro 25 mg daily.   - Continue losartan 25 mg daily.   6. New Afib with RVR - No previous history in chart. Noted overnight 07/28/17. - Back into afib on 3/1. Remains in AF today 90-100 - Re-bolus amiodarone gtt. May need to re-consider DCCV if does not convert.  - This patients CHA2DS2-VASc is at least 6.   - Continue Eliquis 2.5 mg BID. If Creatinine stays below 1.5 will need to increase to 5 bid.   7. H/o L breast cancer - Treated by Dr. [name] - Had radiation 7-01/2015, followed by anastrozole and denosumab/Prolia q 6 months.  - Plan to continue anastrozole for total of 5 years per Dr. [name] note.  - Monitoring for osteoporosis. No change.  8. Hematuria - Now on rocephin for UTI.  - Foley out.   9. Hypokalemia - K 4.5. Mg 2.3   Medication concerns reviewed with patient and pharmacy team. Barriers identified: None at this time.     Length of Stay: [place], MD  08/03/2017, 9:49 AM  Advanced Heart Failure Team Pager [phone] (M-F; 7a - 4p)  Please contact [facility] for night-coverage after hours (4p -7a ) and weekends on [email or web address]

## 2017-08-04 ENCOUNTER — Inpatient Hospital Stay (HOSPITAL_COMMUNITY): Payer: Medicare Other

## 2017-08-04 LAB — COOXEMETRY PANEL
Carboxyhemoglobin: 1.4 % (ref 0.5–1.5)
Methemoglobin: 0.7 % (ref 0.0–1.5)
O2 Saturation: 65.5 %
TOTAL HEMOGLOBIN: 11.1 g/dL — AB (ref 12.0–16.0)

## 2017-08-04 LAB — BASIC METABOLIC PANEL
Anion gap: 9 (ref 5–15)
BUN: 29 mg/dL — ABNORMAL HIGH (ref 6–20)
CHLORIDE: 108 mmol/L (ref 101–111)
CO2: 22 mmol/L (ref 22–32)
Calcium: 9.3 mg/dL (ref 8.9–10.3)
Creatinine, Ser: 1.57 mg/dL — ABNORMAL HIGH (ref 0.44–1.00)
GFR calc Af Amer: 35 mL/min — ABNORMAL LOW (ref >60)
GFR calc non Af Amer: 30 mL/min — ABNORMAL LOW (ref >60)
GLUCOSE: 107 mg/dL — AB (ref 65–99)
POTASSIUM: 4.4 mmol/L (ref 3.5–5.1)
Sodium: 139 mmol/L (ref 135–145)

## 2017-08-04 LAB — GLUCOSE, CAPILLARY
GLUCOSE-CAPILLARY: 95 mg/dL (ref 65–99)
GLUCOSE-CAPILLARY: 105 mg/dL — AB (ref 65–99)
GLUCOSE-CAPILLARY: 152 mg/dL — AB (ref 65–99)
Glucose-Capillary: 115 mg/dL — ABNORMAL HIGH (ref 65–99)
Glucose-Capillary: 147 mg/dL — ABNORMAL HIGH (ref 65–99)

## 2017-08-04 NOTE — Progress Notes (Signed)
Advanced Heart Failure Rounding Note  PCP-Cardiologist: No primary care provider on file.   Subjective:    Events 07/27/17 STEMI with DES x 2 to ramus intermediate artery 07/28/17 Brady-arrest on arrival to ICU require ACLS, CPR, and intubation 07/28/17 Extubated to BiPAP 07/31/17 Started on Rocephin with UTI.   Was back in NSR on IV amio most of the night but this am back in AF. Tolerating Eliquis.   Walked 300 feet yesterday. Denies CP or SOB. Feeling stronger. Volume status stable on po lasix. Co-ox improved to 66%. CVP 5-6   CT chest 07/31/17 with diffuse bilateral interstitial thickening and patchy areas of alveolar space disease. Reviewed CT with pulmonary and radiology. Most likely edema that is clearing.   Objective:   Weight Range: 78.8 kg (173 lb 11.6 oz) Body mass index is 26.41 kg/m.   Vital Signs:   Temp:  [98.1 F (36.7 C)-98.8 F (37.1 C)] 98.2 F (36.8 C) (03/03 0812) Pulse Rate:  [72-125] 83 (03/03 0700) Resp:  [16-32] 18 (03/03 0700) BP: (100-137)/(50-95) 117/58 (03/03 0700) SpO2:  [94 %-100 %] 98 % (03/03 0700) Weight:  [78.8 kg (173 lb 11.6 oz)] 78.8 kg (173 lb 11.6 oz) (03/03 0500) Last BM Date: 08/03/17  Weight change: Filed Weights   08/02/17 0500 08/03/17 0400 08/04/17 0500  Weight: 75.9 kg (167 lb 5.3 oz) 79 kg (174 lb 2.6 oz) 78.8 kg (173 lb 11.6 oz)    Intake/Output:   Intake/Output Summary (Last 24 hours) at 08/04/2017 1105 Last data filed at 08/04/2017 0600 Gross per 24 hour  Intake 493.9 ml  Output 1151 ml  Net -657.1 ml     Physical Exam   CVP 6-7 Sitting in chair General:  Well appearing. No resp difficulty HEENT: normal Neck: supple. JVP 6-7 Carotids 2+ bilat; no bruits. No lymphadenopathy or thryomegaly appreciated. Cor: PMI nondisplaced. Irregular rate & rhythm. No rubs, gallops or murmurs. Lungs: clear with decreased BS throghout Abdomen: soft, nontender, nondistended. No hepatosplenomegaly. No bruits or masses. Good bowel  sounds. Extremities: no cyanosis, clubbing, rash, edema Neuro: alert & orientedx3, cranial nerves grossly intact. moves all 4 extremities w/o difficulty. Affect pleasant  Telemetry   NSR 70s overnight now AF 70-80s, personally reviewed.   EKG    No new tracings.  Labs    CBC Recent Labs    08/02/17 0457 08/03/17 0427  WBC 8.6 8.4  HGB 9.6* 9.3*  HCT 29.8* 29.0*  MCV 90.9 90.1  PLT 229 854   Basic Metabolic Panel Recent Labs    08/02/17 0858 08/03/17 0427 08/04/17 0354  NA  --  139 139  K  --  4.5 4.4  CL  --  109 108  CO2  --  20* 22  GLUCOSE  --  123* 107*  BUN  --  28* 29*  CREATININE  --  1.35* 1.57*  CALCIUM  --  9.4 9.3  MG 2.3  --   --    Liver Function Tests No results for input(s): AST, ALT, ALKPHOS, BILITOT, PROT, ALBUMIN in the last 72 hours. No results for input(s): LIPASE, AMYLASE in the last 72 hours. Cardiac Enzymes No results for input(s): CKTOTAL, CKMB, CKMBINDEX, TROPONINI in the last 72 hours.  BNP: BNP (last 3 results) No results for input(s): BNP in the last 8760 hours.  ProBNP (last 3 results) No results for input(s): PROBNP in the last 8760 hours.   D-Dimer No results for input(s): DDIMER in the last 72 hours.  Hemoglobin A1C No results for input(s): HGBA1C in the last 72 hours. Fasting Lipid Panel No results for input(s): CHOL, HDL, LDLCALC, TRIG, CHOLHDL, LDLDIRECT in the last 72 hours. Thyroid Function Tests No results for input(s): TSH, T4TOTAL, T3FREE, THYROIDAB in the last 72 hours.  Invalid input(s): FREET3  Other results:   Imaging   Dg Chest Port 1 View  Result Date: 08/04/2017 CLINICAL DATA:  CHF EXAM: PORTABLE CHEST 1 VIEW COMPARISON:  Multiple priors, most recently 08/02/2017 FINDINGS: Residual patchy opacities in the bilateral upper lobes, less diffuse than on prior studies. The current distribution is more peripheral than perihilar, raising concern for underlying infection. Mild perihilar edema likely persists,  although this is significantly improved. Small bilateral pleural effusions. Cardiomegaly. Left subclavian catheter terminating at the cavoatrial junction. IMPRESSION: Possible patchy peripheral upper lobe opacities, raising concern for underlying infection. Cardiomegaly with mild perihilar edema, significantly improved from priors. Small bilateral pleural effusions. Electronically Signed   By: Julian Hy M.D.   On: 08/04/2017 07:18    Medications:     Scheduled Medications: . anastrozole  1 mg Oral Daily  . apixaban  2.5 mg Oral BID  . aspirin  81 mg Oral Daily  . atorvastatin  80 mg Oral q1800  . Chlorhexidine Gluconate Cloth  6 each Topical Daily  . clopidogrel  75 mg Oral Daily  . furosemide  80 mg Oral Daily  . insulin aspart  2-6 Units Subcutaneous Q4H  . losartan  25 mg Oral BID  . magnesium gluconate  500 mg Oral BID  . mouth rinse  15 mL Mouth Rinse BID  . multivitamin with minerals  1 tablet Oral Daily  . omega-3 acid ethyl esters  1 g Oral Daily  . pantoprazole  40 mg Oral Daily  . sodium chloride flush  10-40 mL Intracatheter Q12H  . sodium chloride flush  3 mL Intravenous Q12H  . sodium chloride flush  3 mL Intravenous Q12H  . sodium chloride flush  3 mL Intravenous Q12H  . spironolactone  25 mg Oral Daily  . vitamin C  1,000 mg Oral Daily    Infusions: . sodium chloride 250 mL (08/03/17 2000)  . sodium chloride    . sodium chloride    . amiodarone 30 mg/hr (08/04/17 0634)  . levofloxacin (LEVAQUIN) IV 500 mg (08/04/17 0845)    PRN Medications: sodium chloride, sodium chloride, acetaminophen, ondansetron (ZOFRAN) IV, sodium chloride flush, sodium chloride flush, sodium chloride flush, sodium chloride flush, sorbitol  Patient Profile   [name] is a 83 y.o. female with h/o HTN, osteoporosis, L breast cancer s/p XRT and chemo and definitive surgery, HLD, and pre diabetes.    She presented to ED 07/27/17 with CP. Found to have acute anterior MI and  taken to cath lab. [place] 07/27/17 as below with 3v CAD, LVEF 35-40%, elevated LVEDP. Underwent PCI with successful stenting of the ramus intermediate artery with DES x 2.  Had bradycardic arrest on arrival to ICU with epi/atropine and brief CPR.   Assessment/Plan   1. Cardiogenic shock - in setting of Anterior MI/STEMI - now s/p DES x 2 to ramus intermediate artery. Extubated 07/28/17 - Coox mproved today at 66%. Functional status much improved   2. Acute respiratory failure - Initially required intubation.  - Extubated to BiPAP 07/28/17.  - Improving. Remains on [place] - CT chest 07/31/17 with diffuse bilateral interstitial thickening and patchy areas of alveolar space disease. Reviewed CT with pulmonary and radiology. Most  likely edema that is clearing.  - CXR on 3/1 suggestive of PNA. Abx expanded to Levaquin. PCT 0.12 - CXR improved today (Personally reviewed) - Again encouraged need for frequent IS  3. 3v CAD with STEMI, s/p 2x DES to ramus intermediate - Continue ASA and high dose statin at atorvastatin 80 mg daily. - Discussed with Dr. Martinique personally. Have changed brilinta to Plavix.  - Will need DAPT for one year.  - Now on Eliquis. No bleeding. Hgb stable  - Cardiac rehab seeing  4. Bradycardic arrest - Occurred on arrival to unit post cath.  - Holding BB. Follow closely on amiodarone - Had several repeat episodes of bradycardia into 30-40s 07/29/17. BB stopped as above.  - No further bradycardia.   5. Acute systolic CHF 2/2 ischemic cardiomyopathy - Echo 07/28/17 LVEF 40-45%, Mild AI, Mod MR, Mod LAE, Mod TR, PA peak pressure 65 mm Hg. Thick appearing ventricle  - Volume status looks good  - CVP 5 this am. Continue po lasix 60 mg daily.  - Off BB with cardiogenic shock.  - Continue spiro 25 mg daily.   - Continue losartan 25 mg daily.   6. New Afib with RVR - No previous history in chart. Noted overnight 07/28/17. - Back into afib on 3/1. Converted back to NSR overnight  but back in AF this am. Rate controlled  - Continue IV amio one more day - This patients CHA2DS2-VASc is at least 6.   - Continue Eliquis 2.5 mg BID.  7. H/o L breast cancer - Treated by Dr. [name] - Had radiation 7-01/2015, followed by anastrozole and denosumab/Prolia q 6 months.  - Plan to continue anastrozole for total of 5 years per Dr. [name] note.  - Monitoring for osteoporosis. No change.  8. Hematuria - Now on Levaquin for UTI and possible PNA - Foley out.   9. Hypokalemia - K 4.4 today  Dispo: Continue to ambulate. Possible home 24-48 hours.   Medication concerns reviewed with patient and pharmacy team. Barriers identified: None at this time.   Length of Stay: [place], MD  08/04/2017, 11:05 AM  Advanced Heart Failure Team Pager [phone] (M-F; 7a - 4p)  Please contact [facility] for night-coverage after hours (4p -7a ) and weekends on [email or web address]

## 2017-08-05 LAB — GLUCOSE, CAPILLARY
GLUCOSE-CAPILLARY: 95 mg/dL (ref 65–99)
Glucose-Capillary: 109 mg/dL — ABNORMAL HIGH (ref 65–99)
Glucose-Capillary: 103 mg/dL — ABNORMAL HIGH (ref 65–99)
Glucose-Capillary: 122 mg/dL — ABNORMAL HIGH (ref 65–99)
Glucose-Capillary: 117 mg/dL — ABNORMAL HIGH (ref 65–99)

## 2017-08-05 LAB — BASIC METABOLIC PANEL
ANION GAP: 10 (ref 5–15)
BUN: 30 mg/dL — ABNORMAL HIGH (ref 6–20)
CALCIUM: 9.8 mg/dL (ref 8.9–10.3)
CO2: 22 mmol/L (ref 22–32)
CREATININE: 1.66 mg/dL — AB (ref 0.44–1.00)
Chloride: 107 mmol/L (ref 101–111)
GFR, EST AFRICAN AMERICAN: 33 mL/min — AB (ref >60)
GFR, EST NON AFRICAN AMERICAN: 28 mL/min — AB (ref >60)
GLUCOSE: 108 mg/dL — AB (ref 65–99)
Potassium: 4.1 mmol/L (ref 3.5–5.1)
Sodium: 139 mmol/L (ref 135–145)

## 2017-08-05 LAB — COOXEMETRY PANEL
CARBOXYHEMOGLOBIN: 0.8 % (ref 0.5–1.5)
METHEMOGLOBIN: 1.1 % (ref 0.0–1.5)
O2 SAT: 61.5 %
Total hemoglobin: 15.5 g/dL (ref 12.0–16.0)

## 2017-08-05 MED ORDER — WHITE PETROLATUM EX OINT
TOPICAL_OINTMENT | CUTANEOUS | Status: AC
  Administered 2017-08-05: 12:00:00
  Filled 2017-08-05: qty 28.35

## 2017-08-05 MED ORDER — INSULIN ASPART 100 UNIT/ML ~~LOC~~ SOLN
2.0000 [IU] | Freq: Three times a day (TID) | SUBCUTANEOUS | Status: DC
  Administered 2017-08-05: 4 [IU] via SUBCUTANEOUS

## 2017-08-05 MED ORDER — AMIODARONE HCL 200 MG PO TABS
200.0000 mg | ORAL_TABLET | Freq: Two times a day (BID) | ORAL | Status: DC
  Administered 2017-08-05 (×2): 200 mg via ORAL
  Filled 2017-08-05 (×2): qty 1

## 2017-08-05 NOTE — Plan of Care (Signed)
  Progressing Education: Knowledge of General Education information will improve 08/05/2017 0249 - Progressing by Beryle Quant, RN Health Behavior/Discharge Planning: Ability to manage health-related needs will improve 08/05/2017 0249 - Progressing by Beryle Quant, RN Clinical Measurements: Ability to maintain clinical measurements within normal limits will improve 08/05/2017 0249 - Progressing by Beryle Quant, RN Will remain free from infection 08/05/2017 0249 - Progressing by Beryle Quant, RN Diagnostic test results will improve 08/05/2017 0249 - Progressing by Beryle Quant, RN Respiratory complications will improve 08/05/2017 0249 - Progressing by Beryle Quant, RN Cardiovascular complication will be avoided 08/05/2017 0249 - Progressing by Beryle Quant, RN Nutrition: Adequate nutrition will be maintained 08/05/2017 0249 - Progressing by Beryle Quant, RN Elimination: Will not experience complications related to bowel motility 08/05/2017 0249 - Progressing by Beryle Quant, RN Will not experience complications related to urinary retention 08/05/2017 0249 - Progressing by Beryle Quant, RN Pain Managment: General experience of comfort will improve 08/05/2017 0249 - Progressing by Beryle Quant, RN Skin Integrity: Risk for impaired skin integrity will decrease 08/05/2017 0249 - Progressing by Beryle Quant, RN Education: Understanding of CV disease, CV risk reduction, and recovery process will improve 08/05/2017 0249 - Progressing by Beryle Quant, RN Activity: Ability to return to baseline activity level will improve 08/05/2017 0249 - Progressing by Beryle Quant, RN Cardiovascular: Ability to achieve and maintain adequate cardiovascular perfusion will improve 08/05/2017 0249 - Progressing by Beryle Quant, RN Vascular access site(s) Level 0-1 will be maintained 08/05/2017 0249 - Progressing by Beryle Quant, [place]  Behavior/Discharge Planning: Ability to safely manage health-related needs after discharge will improve 08/05/2017 0249 - Progressing by Beryle Quant, RN Education: Understanding of cardiac disease, CV risk reduction, and recovery process will improve 08/05/2017 0249 - Progressing by Beryle Quant, RN Understanding of medication regimen will improve 08/05/2017 0249 - Progressing by Beryle Quant, RN Activity: Ability to tolerate increased activity will improve 08/05/2017 0249 - Progressing by Beryle Quant, RN Cardiac: Ability to achieve and maintain adequate cardiopulmonary perfusion will improve 08/05/2017 0249 - Progressing by Beryle Quant, RN Vascular access site(s) Level 0-1 will be maintained 08/05/2017 0249 - Progressing by Beryle Quant, RN Health Behavior/Discharge Planning: Ability to safely manage health-related needs after discharge will improve 08/05/2017 0249 - Progressing by Beryle Quant, RN

## 2017-08-05 NOTE — Progress Notes (Addendum)
Advanced Heart Failure Rounding Note  PCP-Cardiologist: No primary care provider on file.   Subjective:    Events 07/27/17 STEMI with DES x 2 to ramus intermediate artery 07/28/17 Brady-arrest on arrival to ICU require ACLS, CPR, and intubation 07/28/17 Extubated to BiPAP 07/31/17 Started on Rocephin with UTI.   Maintaining NSR. On amio drip at 30 mg per hour.   Denies SOB. Feeling ok.   CT chest 07/31/17 with diffuse bilateral interstitial thickening and patchy areas of alveolar space disease. Reviewed CT with pulmonary and radiology. Most likely edema that is clearing.   Objective:   Weight Range: 169 lb 5 oz (76.8 kg) Body mass index is 25.74 kg/m.   Vital Signs:   Temp:  [97.8 F (36.6 C)-98.7 F (37.1 C)] 98.5 F (36.9 C) (03/04 0753) Pulse Rate:  [75-113] 86 (03/04 0400) Resp:  [15-22] 20 (03/04 0400) BP: (116-139)/(52-83) 134/64 (03/04 0400) SpO2:  [93 %-100 %] 93 % (03/04 0400) Weight:  [169 lb 5 oz (76.8 kg)] 169 lb 5 oz (76.8 kg) (03/04 0500) Last BM Date: 08/04/17  Weight change: Filed Weights   08/03/17 0400 08/04/17 0500 08/05/17 0500  Weight: 174 lb 2.6 oz (79 kg) 173 lb 11.6 oz (78.8 kg) 169 lb 5 oz (76.8 kg)    Intake/Output:   Intake/Output Summary (Last 24 hours) at 08/05/2017 0802 Last data filed at 08/05/2017 0600 Gross per 24 hour  Intake 1327.4 ml  Output 425 ml  Net 902.4 ml     Physical Exam  CVP 2-3  General:   No resp difficulty. Sitting on the side of the bed.  HEENT: normal anicteric Neck: supple. no JVD. Carotids 2+ bilat; no bruits. No lymphadenopathy or thryomegaly appreciated. Cor: PMI nondisplaced. Regular rate & rhythm. No rubs, gallops or murmurs. Lungs: clear with decreased BS throughout  Abdomen: soft, nontender, nondistended. No hepatosplenomegaly. No bruits or masses. Good bowel sounds. Extremities: no cyanosis, clubbing, rash, edema. RUE PICC Neuro: alert & orientedx3, cranial nerves grossly intact. moves all 4  extremities w/o difficulty. Affect pleasant   Telemetry   NSR 70-80s personally reviewed.   EKG    NSR 85 bpm personally reviewed.   Labs    CBC Recent Labs    08/03/17 0427  WBC 8.4  HGB 9.3*  HCT 29.0*  MCV 90.1  PLT 546   Basic Metabolic Panel Recent Labs    08/02/17 0858  08/04/17 0354 08/05/17 0425  NA  --    < > 139 139  K  --    < > 4.4 4.1  CL  --    < > 108 107  CO2  --    < > 22 22  GLUCOSE  --    < > 107* 108*  BUN  --    < > 29* 30*  CREATININE  --    < > 1.57* 1.66*  CALCIUM  --    < > 9.3 9.8  MG 2.3  --   --   --    < > = values in this interval not displayed.   Liver Function Tests No results for input(s): AST, ALT, ALKPHOS, BILITOT, PROT, ALBUMIN in the last 72 hours. No results for input(s): LIPASE, AMYLASE in the last 72 hours. Cardiac Enzymes No results for input(s): CKTOTAL, CKMB, CKMBINDEX, TROPONINI in the last 72 hours.  BNP: BNP (last 3 results) No results for input(s): BNP in the last 8760 hours.  ProBNP (last 3 results) No results  for input(s): PROBNP in the last 8760 hours.   D-Dimer No results for input(s): DDIMER in the last 72 hours. Hemoglobin A1C No results for input(s): HGBA1C in the last 72 hours. Fasting Lipid Panel No results for input(s): CHOL, HDL, LDLCALC, TRIG, CHOLHDL, LDLDIRECT in the last 72 hours. Thyroid Function Tests No results for input(s): TSH, T4TOTAL, T3FREE, THYROIDAB in the last 72 hours.  Invalid input(s): FREET3  Other results:   Imaging   No results found.  Medications:     Scheduled Medications: . anastrozole  1 mg Oral Daily  . apixaban  2.5 mg Oral BID  . aspirin  81 mg Oral Daily  . atorvastatin  80 mg Oral q1800  . Chlorhexidine Gluconate Cloth  6 each Topical Daily  . clopidogrel  75 mg Oral Daily  . furosemide  80 mg Oral Daily  . insulin aspart  2-6 Units Subcutaneous TID WC  . losartan  25 mg Oral BID  . magnesium gluconate  500 mg Oral BID  . mouth rinse  15 mL  Mouth Rinse BID  . multivitamin with minerals  1 tablet Oral Daily  . omega-3 acid ethyl esters  1 g Oral Daily  . pantoprazole  40 mg Oral Daily  . sodium chloride flush  10-40 mL Intracatheter Q12H  . sodium chloride flush  3 mL Intravenous Q12H  . sodium chloride flush  3 mL Intravenous Q12H  . sodium chloride flush  3 mL Intravenous Q12H  . spironolactone  25 mg Oral Daily  . vitamin C  1,000 mg Oral Daily    Infusions: . sodium chloride 250 mL (08/03/17 2000)  . sodium chloride    . sodium chloride    . amiodarone 30 mg/hr (08/05/17 0428)  . levofloxacin (LEVAQUIN) IV Stopped (08/04/17 0945)    PRN Medications: sodium chloride, sodium chloride, acetaminophen, ondansetron (ZOFRAN) IV, sodium chloride flush, sodium chloride flush, sodium chloride flush, sodium chloride flush, sorbitol  Patient Profile   [name] is a 83 y.o. female with h/o HTN, osteoporosis, L breast cancer s/p XRT and chemo and definitive surgery, HLD, and pre diabetes.    She presented to ED 07/27/17 with CP. Found to have acute anterior MI and taken to cath lab. [place] 07/27/17 as below with 3v CAD, LVEF 35-40%, elevated LVEDP. Underwent PCI with successful stenting of the ramus intermediate artery with DES x 2.  Had bradycardic arrest on arrival to ICU with epi/atropine and brief CPR.   Assessment/Plan   1. Cardiogenic shock - in setting of Anterior MI/STEMI - now s/p DES x 2 to ramus intermediate artery. Extubated 07/28/17 -Co-ox 62%. Volume status stable. CVP 2-3.  - Continue losartan 25 mg twice a day.  - Hold lasix. Creatinine trending up.   2. Acute respiratory failure - Initially required intubation.  - Extubated to BiPAP 07/28/17.  -. Remains on [place] - CT chest 07/31/17 with diffuse bilateral interstitial thickening and patchy areas of alveolar space disease. Reviewed CT with pulmonary and radiology. Most likely edema that is clearing.  - CXR on 3/1 suggestive of PNA. Abx expanded to Levaquin.  PCT 0.12  3. 3v CAD with STEMI, s/p 2x DES to ramus intermediate - Continue ASA and high dose statin at atorvastatin 80 mg daily. - Discussed with Dr. Martinique personally. Have changed brilinta to Plavix.  - Will need DAPT for one year.  - Continue eliquis 2.5 mg twice a day. ( age/creatinine)  4. Bradycardic arrest - Occurred on arrival  to unit post cath.  - Holding BB. Follow closely on amiodarone - Had several repeat episodes of bradycardia into 30-40s 07/29/17. - No further bardycardia.    5. Acute systolic CHF 2/2 ischemic cardiomyopathy - Echo 07/28/17 LVEF 40-45%, Mild AI, Mod MR, Mod LAE, Mod TR, PA peak pressure 65 mm Hg. Thick appearing ventricle CVP  2-3. Hold lasix.  - Off BB with cardiogenic shock.  - Continue spiro 25 mg daily.   - Continue losartan 25 mg/ twice a day.    6. New Afib with RVR - No previous history in chart. Noted overnight 07/28/17. - Back into afib on 3/1.  - Maintaining NSR.  - Stop amio drip. Start amio 200 mg twice a day.  - This patients CHA2DS2-VASc is at least 6.   - Continue Eliquis 2.5 mg BID.  7. H/o L breast cancer - Treated by Dr. [name] - Had radiation 7-01/2015, followed by anastrozole and denosumab/Prolia q 6 months.  - Plan to continue anastrozole for total of 5 years per Dr. [name] note.  - Monitoring for osteoporosis. No change.  8. Hematuria - Now on Levaquin for UTI and possible PNA   9. Hypokalemia - Resolved.   Medication concerns reviewed with patient and pharmacy team. Barriers identified: Consult  Care Management for eliquis.   Length of Stay: [place], NP  08/05/2017, 8:02 AM  Advanced Heart Failure Team Pager [phone] (M-F; 7a - 4p)  Please contact [facility] for night-coverage after hours (4p -7a ) and weekends on [email or web address]  Patient seen and examined with Darrick Grinder, NP. We discussed all aspects of the encounter. I agree with the assessment and plan as stated above.   Much improved today. Back  in NSR on IV amio. Volume status looks good. Creatinine up slightly. Lasix adjusted. Will finish abx tomorrow.   Plan to transfer to tele today. Continue ambulation. Hopefully home in am.   Glori Bickers, MD  9:49 AM

## 2017-08-05 NOTE — Progress Notes (Addendum)
Pt and son refusing blood sugar check this evening. Pt has no documented history of diabetes, not on steroids, etc., does not exhibit s/sx of hypoglycemia. Will pass on to day shift to ask rounding MD in the morning if this order can be discontinued.

## 2017-08-05 NOTE — Progress Notes (Signed)
CARDIAC REHAB PHASE I   PRE:  Rate/Rhythm: 88 SR  BP:  Sitting: 112/52    SaO2: 100% RA  MODE:  Ambulation: 370 ft   POST:  Rate/Rhythm: 98 ST  BP:  Sitting: 120/44     SaO2: 95% RA  1354-1455 Pt ambulated 314ft with RW on RA.  Education given to patient including the Heart Failure packet.  Emphasized antiplatelet and NTG medications.  Verbalized understanding. CRPII Referral made to [facility]. Would recommend RW for home use.    Noel Christmas, RN 08/05/2017 2:50 PM

## 2017-08-05 NOTE — Progress Notes (Signed)
Physical Therapy Treatment Patient Details Name: [name] MRN: [id number] DOB: 10-Mar-1935 Today's Date: 08/05/2017    History of Present Illness Pt is a 83 y.o. female with PMH of breast CA, HTN, HLD, who presents 07/27/17 with acute onset of chest pain; ECG consistent with STEMI. S/p PCI with stent 2/23; pt with brief bradycardic arrest requiring intubation 2/23-2/24. CXR showing pulmonary edema.    PT Comments    Pt performed increased gait and reviewed stair training in prep for d/c home.  Pt fatigues with activity and unsteady without B UE support.  Plan next session for gait training without device to challenge balance but she will benefit from use of a RW at d/c for safety at home.   Follow Up Recommendations  Home health PT     Equipment Recommendations  Rolling walker with 5" wheels    Recommendations for Other Services       Precautions / Restrictions Precautions Precautions: Fall Restrictions Weight Bearing Restrictions: No    Mobility  Bed Mobility Overal bed mobility: Independent             General bed mobility comments: Received sitting in recliner  Transfers Overall transfer level: Needs assistance Equipment used: Rolling walker (2 wheeled) Transfers: Sit to/from Stand Sit to Stand: Supervision         General transfer comment: Cues for hand placement to push from seated surface.   Ambulation/Gait Ambulation/Gait assistance: Min guard Ambulation Distance (Feet): 350 Feet Assistive device: Rolling walker (2 wheeled) Gait Pattern/deviations: Step-through pattern;Decreased stride length;Narrow base of support Gait velocity: Decreased Gait velocity interpretation: Below normal speed for age/gender General Gait Details: Slow, slightly unsteady amb with RW and intermittent min guard for balance.  Pt tends to look around which causes her to drift R/L. Pt much more steady with a RW.    Stairs Stairs: Yes  min guard Stair Management: One rail  Right;Step to pattern;Forwards Number of Stairs: 2 General stair comments: Cues for sequencing and safety.    Wheelchair Mobility    Modified Rankin (Stroke Patients Only)       Balance Overall balance assessment: Needs assistance   Sitting balance-Leahy Scale: Good       Standing balance-Leahy Scale: Fair Standing balance comment: Can static stand with no UE; dynamic stability improved with RW                            Cognition Arousal/Alertness: Awake/alert Behavior During Therapy: WFL for tasks assessed/performed Overall Cognitive Status: Within Functional Limits for tasks assessed                                        Exercises General Exercises - Lower Extremity Long Arc Quad: AROM;Both;10 reps;Seated Hip ABduction/ADduction: AROM;Both;10 reps;Seated(performed via pillow squeeze. ) Hip Flexion/Marching: AROM;Both;10 reps;Seated    General Comments        Pertinent Vitals/Pain Pain Assessment: No/denies pain    Home Living                      Prior Function            PT Goals (current goals can now be found in the care plan section) Acute Rehab PT Goals Patient Stated Goal: Return home Potential to Achieve Goals: Good Progress towards PT goals: Progressing toward goals  Frequency    Min 3X/week      PT Plan Current plan remains appropriate    Co-evaluation              AM-PAC PT "6 Clicks" Daily Activity  Outcome Measure  Difficulty turning over in bed (including adjusting bedclothes, sheets and blankets)?: None Difficulty moving from lying on back to sitting on the side of the bed? : None Difficulty sitting down on and standing up from a chair with arms (e.g., wheelchair, bedside commode, etc,.)?: A Little Help needed moving to and from a bed to chair (including a wheelchair)?: A Little Help needed walking in hospital room?: A Little Help needed climbing 3-5 steps with a railing? : A  Little 6 Click Score: 20    End of Session Equipment Utilized During Treatment: Gait belt Activity Tolerance: Patient tolerated treatment well Patient left: in chair;with call bell/phone within reach;with nursing/sitter in room Nurse Communication: Mobility status PT Visit Diagnosis: Other abnormalities of gait and mobility (R26.89)     Time: 4707-6151 PT Time Calculation (min) (ACUTE ONLY): 16 min  Charges:  $Gait Training: 8-22 mins                    G Codes:       Governor Rooks, PTA pager [place] 08/05/2017, 5:00 PM

## 2017-08-05 NOTE — Progress Notes (Addendum)
Discharge Planning: Sent for Benefits Check for Eliquis (Apixaban). Waiting results. Chesley Mires, RN        # 1.  S/W NIK[facility] @ [facility] # [phone]   1. ELIQUIS  2.5 MG BID  COVER- YES  CO-PAY- $ 45.00  Q/L TWO PILLS PER DAY  TIER- 3 DRUG  PRIOR APPROVAL- NO   PREFERRED PHARMACY : [facility]       Jonnie Finner RN CCM Case Mgmt phone [phone]

## 2017-08-05 NOTE — Progress Notes (Signed)
Patient son Has made request to have his mother worked worked up for depression based on conversation with MD.  He hoped to start treatment during this hospitalization not to delay.  He agreed to plan to make request of rounding MD to address in AM prior to discharge plan.    Richardean Canal RN, BSN, CCRN

## 2017-08-06 ENCOUNTER — Other Ambulatory Visit: Payer: Self-pay

## 2017-08-06 LAB — BASIC METABOLIC PANEL
Anion gap: 10 (ref 5–15)
BUN: 27 mg/dL — ABNORMAL HIGH (ref 6–20)
CALCIUM: 10.2 mg/dL (ref 8.9–10.3)
CO2: 21 mmol/L — AB (ref 22–32)
CREATININE: 1.61 mg/dL — AB (ref 0.44–1.00)
Chloride: 108 mmol/L (ref 101–111)
GFR calc Af Amer: 34 mL/min — ABNORMAL LOW (ref >60)
GFR calc non Af Amer: 29 mL/min — ABNORMAL LOW (ref >60)
GLUCOSE: 113 mg/dL — AB (ref 65–99)
Potassium: 4.2 mmol/L (ref 3.5–5.1)
Sodium: 139 mmol/L (ref 135–145)

## 2017-08-06 LAB — HEPATIC FUNCTION PANEL
ALT: 46 U/L (ref 14–54)
AST: 30 U/L (ref 15–41)
Albumin: 2.5 g/dL — ABNORMAL LOW (ref 3.5–5.0)
Alkaline Phosphatase: 53 U/L (ref 38–126)
BILIRUBIN TOTAL: 1 mg/dL (ref 0.3–1.2)
Bilirubin, Direct: 0.1 mg/dL (ref 0.1–0.5)
Indirect Bilirubin: 0.9 mg/dL (ref 0.3–0.9)
Total Protein: 6 g/dL — ABNORMAL LOW (ref 6.5–8.1)

## 2017-08-06 MED ORDER — AMIODARONE HCL IN DEXTROSE 360-4.14 MG/200ML-% IV SOLN
30.0000 mg/h | INTRAVENOUS | Status: DC
  Administered 2017-08-06 – 2017-08-07 (×2): 30 mg/h via INTRAVENOUS
  Filled 2017-08-06 (×2): qty 200

## 2017-08-06 MED ORDER — CITALOPRAM HYDROBROMIDE 10 MG PO TABS
10.0000 mg | ORAL_TABLET | Freq: Every day | ORAL | Status: DC
Start: 2017-08-06 — End: 2017-08-07
  Administered 2017-08-06 – 2017-08-07 (×2): 10 mg via ORAL
  Filled 2017-08-06 (×2): qty 1

## 2017-08-06 MED ORDER — AMIODARONE HCL IN DEXTROSE 360-4.14 MG/200ML-% IV SOLN
60.0000 mg/h | INTRAVENOUS | Status: AC
  Administered 2017-08-06: 60 mg/h via INTRAVENOUS
  Filled 2017-08-06: qty 400

## 2017-08-06 MED ORDER — AMIODARONE LOAD VIA INFUSION
150.0000 mg | Freq: Once | INTRAVENOUS | Status: AC
  Administered 2017-08-06: 150 mg via INTRAVENOUS
  Filled 2017-08-06: qty 83.34

## 2017-08-06 NOTE — Plan of Care (Signed)
Transferred via [place] and monitor to [id number], RN to receive in room

## 2017-08-06 NOTE — Progress Notes (Addendum)
Advanced Heart Failure Rounding Note  PCP-Cardiologist: No primary care provider on file.   Subjective:    Events 07/27/17 STEMI with DES x 2 to ramus intermediate artery 07/28/17 Brady-arrest on arrival to ICU require ACLS, CPR, and intubation 07/28/17 Extubated to BiPAP 07/31/17 Started on Rocephin with UTI.   Yesterday amio drip was stopped and central line was removed. Back in a fib over night - rate 100-120.   Denies SOB. Complaining of fatigue due to sleep interruptions. Weight down a pound.   CT chest 07/31/17 with diffuse bilateral interstitial thickening and patchy areas of alveolar space disease. Reviewed CT with pulmonary and radiology. Most likely edema that is clearing.   Objective:   Weight Range: 168 lb 3.4 oz (76.3 kg) Body mass index is 25.58 kg/m.   Vital Signs:   Temp:  [97.7 F (36.5 C)-98.7 F (37.1 C)] 97.7 F (36.5 C) (03/05 0700) Pulse Rate:  [77-128] 100 (03/05 0600) Resp:  [19-30] 20 (03/05 0600) BP: (110-138)/(50-71) 112/50 (03/05 0600) SpO2:  [92 %-100 %] 95 % (03/05 0600) Weight:  [168 lb 3.4 oz (76.3 kg)] 168 lb 3.4 oz (76.3 kg) (03/05 0600) Last BM Date: 08/05/17  Weight change: Filed Weights   08/04/17 0500 08/05/17 0500 08/06/17 0600  Weight: 173 lb 11.6 oz (78.8 kg) 169 lb 5 oz (76.8 kg) 168 lb 3.4 oz (76.3 kg)    Intake/Output:   Intake/Output Summary (Last 24 hours) at 08/06/2017 0756 Last data filed at 08/06/2017 0732 Gross per 24 hour  Intake 953.4 ml  Output 1900 ml  Net -946.6 ml     Physical Exam  General:   No resp difficulty. Sitting in the chair.  HEENT: normal anicteric  Neck: supple. JVP 5-6. Carotids 2+ bilat; no bruits. No lymphadenopathy or thryomegaly appreciated. Cor: PMI nondisplaced. Irregular rate & rhythm. No rubs, gallops or murmurs. Lungs: clear with decreased BS throughout Abdomen: soft, nontender, nondistended. No hepatosplenomegaly. No bruits or masses. Good bowel sounds. Extremities: no cyanosis,  clubbing, rash, edema Neuro: alert & orientedx3, cranial nerves grossly intact. moves all 4 extremities w/o difficulty. Affect pleasant   Telemetry  A fib 110-120s went back in A fib around 2300.    EKG  N/A  Labs    CBC No results for input(s): WBC, NEUTROABS, HGB, HCT, MCV, PLT in the last 72 hours. Basic Metabolic Panel Recent Labs    08/05/17 0425 08/06/17 0303  NA 139 139  K 4.1 4.2  CL 107 108  CO2 22 21*  GLUCOSE 108* 113*  BUN 30* 27*  CREATININE 1.66* 1.61*  CALCIUM 9.8 10.2   Liver Function Tests Recent Labs    08/06/17 0303  AST 30  ALT 46  ALKPHOS 53  BILITOT 1.0  PROT 6.0*  ALBUMIN 2.5*   No results for input(s): LIPASE, AMYLASE in the last 72 hours. Cardiac Enzymes No results for input(s): CKTOTAL, CKMB, CKMBINDEX, TROPONINI in the last 72 hours.  BNP: BNP (last 3 results) No results for input(s): BNP in the last 8760 hours.  ProBNP (last 3 results) No results for input(s): PROBNP in the last 8760 hours.   D-Dimer No results for input(s): DDIMER in the last 72 hours. Hemoglobin A1C No results for input(s): HGBA1C in the last 72 hours. Fasting Lipid Panel No results for input(s): CHOL, HDL, LDLCALC, TRIG, CHOLHDL, LDLDIRECT in the last 72 hours. Thyroid Function Tests No results for input(s): TSH, T4TOTAL, T3FREE, THYROIDAB in the last 72 hours.  Invalid input(s):  FREET3  Other results:   Imaging   No results found.  Medications:     Scheduled Medications: . amiodarone  200 mg Oral BID  . anastrozole  1 mg Oral Daily  . apixaban  2.5 mg Oral BID  . aspirin  81 mg Oral Daily  . atorvastatin  80 mg Oral q1800  . clopidogrel  75 mg Oral Daily  . insulin aspart  2-6 Units Subcutaneous TID WC  . losartan  25 mg Oral BID  . magnesium gluconate  500 mg Oral BID  . mouth rinse  15 mL Mouth Rinse BID  . multivitamin with minerals  1 tablet Oral Daily  . omega-3 acid ethyl esters  1 g Oral Daily  . pantoprazole  40 mg Oral Daily    . sodium chloride flush  3 mL Intravenous Q12H  . sodium chloride flush  3 mL Intravenous Q12H  . sodium chloride flush  3 mL Intravenous Q12H  . spironolactone  25 mg Oral Daily  . vitamin C  1,000 mg Oral Daily    Infusions: . sodium chloride Stopped (08/05/17 1200)  . sodium chloride    . sodium chloride    . levofloxacin (LEVAQUIN) IV 500 mg (08/06/17 0732)    PRN Medications: sodium chloride, sodium chloride, acetaminophen, ondansetron (ZOFRAN) IV, sodium chloride flush, sodium chloride flush, sodium chloride flush, sorbitol  Patient Profile   [name] is a 83 y.o. female with h/o HTN, osteoporosis, L breast cancer s/p XRT and chemo and definitive surgery, HLD, and pre diabetes.    She presented to ED 07/27/17 with CP. Found to have acute anterior MI and taken to cath lab. [place] 07/27/17 as below with 3v CAD, LVEF 35-40%, elevated LVEDP. Underwent PCI with successful stenting of the ramus intermediate artery with DES x 2.  Had bradycardic arrest on arrival to ICU with epi/atropine and brief CPR.   Assessment/Plan   1. Cardiogenic shock - in setting of Anterior MI/STEMI - now s/p DES x 2 to ramus intermediate artery. Extubated 07/28/17 Volume status stable. Continue losartan 25 mg twice a day.   2. Acute respiratory failure - Initially required intubation.  - Extubated to BiPAP 07/28/17.  -Stable on room air.  - CT chest 07/31/17 with diffuse bilateral interstitial thickening and patchy areas of alveolar space disease. Reviewed CT with pulmonary and radiology. Most likely edema that is clearing.  - CXR on 3/1 suggestive of PNA. Abx expanded to Levaquin. PCT 0.12  3. 3v CAD with STEMI, s/p 2x DES to ramus intermediate - Continue ASA and high dose statin at atorvastatin 80 mg daily. - Discussed with Dr. Martinique personally. Have changed brilinta to Plavix.  - Will need DAPT for one year.  - Continue eliquis 2.5 mg twice a day. ( age/creatinine)  4. Bradycardic arrest -  Occurred on arrival to unit post cath.  - Holding BB. Follow closely on amiodarone - Had several repeat episodes of bradycardia into 30-40s 07/29/17. - No further bardycardia.    5. Acute systolic CHF 2/2 ischemic cardiomyopathy - Echo 07/28/17 LVEF 40-45%, Mild AI, Mod MR, Mod LAE, Mod TR, PA peak pressure 65 mm Hg. Thick appearing ventricle - Volume status stable. Hold lasix for nwo.  - Off BB with cardiogenic shock.  - Continue spiro 25 mg daily.   - Continue losartan 25 mg/ twice a day.    6. New Afib with RVR - Noted  07/28/17. Back into afib on 3/1 on amio drip with  chemical conversion to NSR.  - Last night she went back into A fib around 2300.  - Stop amio po Start amio drip. May need cardioversion.  - This patients CHA2DS2-VASc is at least 6.   - Continue Eliquis 2.5 mg BID. (lower dose with age and creatinine)   7. H/o L breast cancer - Treated by Dr. [name] - Had radiation 7-01/2015, followed by anastrozole and denosumab/Prolia q 6 months.  - Plan to continue anastrozole for total of 5 years per Dr. [name] note.  - Monitoring for osteoporosis. No change.  8. Hematuria - Finished Levaquin for UTI and possible PNA  9. Hypokalemia - Resolved. K 4.2  10. Depression Add celexa 10 mg daily  Continue cardiac rehab. Transfer to progressive care.     Medication concerns reviewed with patient and pharmacy team. Barriers identified: Consult  Care Management for eliquis. Care Manager - 45.00 Co-pay. Will give 30 day free card.   Length of Stay: [place], NP  08/06/2017, 7:56 AM  Advanced Heart Failure Team Pager [phone] (M-F; [place])  Please contact [facility] for night-coverage after hours (4p -7a ) and weekends on [email or web address]  Patient seen and examined with Darrick Grinder, NP. We discussed all aspects of the encounter. I agree with the assessment and plan as stated above.   She is much improved however unfortunately back in AF today. Will restart IV amio. May  need to consider trial of rate control strategy. No role for DC-CV given frequent in/out AF. Appreciate CM help with Eliquis. Watch for bleeding on triple therapy.  LVH on echo raises concern for possible TTR amyloidosis.  Consider outpatient PYP scan.  Glori Bickers, MD  11:26 PM

## 2017-08-06 NOTE — Plan of Care (Signed)
  Progressing Education: Knowledge of General Education information will improve 08/06/2017 0418 - Progressing by Alonna Buckler, RN Health Behavior/Discharge Planning: Ability to manage health-related needs will improve 08/06/2017 0418 - Progressing by Alonna Buckler, RN Clinical Measurements: Ability to maintain clinical measurements within normal limits will improve 08/06/2017 0418 - Progressing by Alonna Buckler, RN Will remain free from infection 08/06/2017 0418 - Progressing by Alonna Buckler, RN Diagnostic test results will improve 08/06/2017 0418 - Progressing by Alonna Buckler, RN Respiratory complications will improve 08/06/2017 0418 - Progressing by Alonna Buckler, RN Cardiovascular complication will be avoided 08/06/2017 0418 - Progressing by Alonna Buckler, RN Nutrition: Adequate nutrition will be maintained 08/06/2017 0418 - Progressing by Alonna Buckler, RN Elimination: Will not experience complications related to bowel motility 08/06/2017 0418 - Progressing by Alonna Buckler, RN Will not experience complications related to urinary retention 08/06/2017 0418 - Progressing by Alonna Buckler, RN Pain Managment: General experience of comfort will improve 08/06/2017 0418 - Progressing by Alonna Buckler, RN Skin Integrity: Risk for impaired skin integrity will decrease 08/06/2017 0418 - Progressing by Alonna Buckler, RN Education: Understanding of CV disease, CV risk reduction, and recovery process will improve 08/06/2017 0418 - Progressing by Alonna Buckler, RN Activity: Ability to return to baseline activity level will improve 08/06/2017 0418 - Progressing by Alonna Buckler, RN Cardiovascular: Ability to achieve and maintain adequate cardiovascular perfusion will improve 08/06/2017 0418 - Progressing by Alonna Buckler, RN Health Behavior/Discharge Planning: Ability to safely manage health-related needs after discharge will improve 08/06/2017 0418 -  Progressing by Alonna Buckler, RN Education: Understanding of cardiac disease, CV risk reduction, and recovery process will improve 08/06/2017 0418 - Progressing by Alonna Buckler, RN Understanding of medication regimen will improve 08/06/2017 0418 - Progressing by Alonna Buckler, RN Activity: Ability to tolerate increased activity will improve 08/06/2017 0418 - Progressing by Alonna Buckler, RN Cardiac: Ability to achieve and maintain adequate cardiopulmonary perfusion will improve 08/06/2017 0418 - Progressing by Alonna Buckler, RN Health Behavior/Discharge Planning: Ability to safely manage health-related needs after discharge will improve 08/06/2017 0418 - Progressing by Alonna Buckler, RN   Completed/Met Cardiovascular: Vascular access site(s) Level 0-1 will be maintained 08/06/2017 0418 - Completed/Met by Alonna Buckler, RN Cardiac: Vascular access site(s) Level 0-1 will be maintained 08/06/2017 0418 - Completed/Met by Alonna Buckler, RN

## 2017-08-06 NOTE — Plan of Care (Signed)
  Progressing Education: Knowledge of General Education information will improve 08/06/2017 0954 - Progressing by Hillard Danker, RN Health Behavior/Discharge Planning: Ability to manage health-related needs will improve 08/06/2017 0954 - Progressing by Hillard Danker, RN Clinical Measurements: Ability to maintain clinical measurements within normal limits will improve 08/06/2017 0954 - Progressing by Hillard Danker, RN Will remain free from infection 08/06/2017 0954 - Progressing by Hillard Danker, RN Diagnostic test results will improve 08/06/2017 0954 - Progressing by Hillard Danker, RN Respiratory complications will improve 08/06/2017 0954 - Progressing by Hillard Danker, RN Cardiovascular complication will be avoided 08/06/2017 0954 - Progressing by Hillard Danker, RN Nutrition: Adequate nutrition will be maintained 08/06/2017 0954 - Progressing by Hillard Danker, RN Elimination: Will not experience complications related to bowel motility 08/06/2017 0954 - Progressing by Hillard Danker, RN Will not experience complications related to urinary retention 08/06/2017 0954 - Progressing by Hillard Danker, RN Pain Managment: General experience of comfort will improve 08/06/2017 0954 - Progressing by Hillard Danker, RN Skin Integrity: Risk for impaired skin integrity will decrease 08/06/2017 0954 - Progressing by Hillard Danker, RN Education: Understanding of CV disease, CV risk reduction, and recovery process will improve 08/06/2017 0954 - Progressing by Hillard Danker, RN Activity: Ability to return to baseline activity level will improve 08/06/2017 0954 - Progressing by Hillard Danker, RN Cardiovascular: Ability to achieve and maintain adequate cardiovascular perfusion will improve 08/06/2017 0954 - Progressing by Hillard Danker, RN Health Behavior/Discharge Planning: Ability to safely manage health-related needs after discharge will improve 08/06/2017 0954 - Progressing by Hillard Danker,  RN Education: Understanding of cardiac disease, CV risk reduction, and recovery process will improve 08/06/2017 0954 - Progressing by Hillard Danker, RN Activity: Ability to tolerate increased activity will improve 08/06/2017 0954 - Progressing by Hillard Danker, RN Cardiac: Ability to achieve and maintain adequate cardiopulmonary perfusion will improve 08/06/2017 0954 - Progressing by Hillard Danker, RN Health Behavior/Discharge Planning: Ability to safely manage health-related needs after discharge will improve 08/06/2017 0954 - Progressing by Hillard Danker, RN

## 2017-08-06 NOTE — Progress Notes (Signed)
CARDIAC REHAB PHASE I   PRE:  Rate/Rhythm: 74 afib  BP:  Supine: 115/68  Sitting:   Standing:    SaO2: 97%RA  MODE:  Ambulation: 200 ft   POST:  Rate/Rhythm: 96 afib  BP:  Supine:   Sitting: 107/68  Standing:    SaO2: 98%RA 1150-1220 Pt walked 200 ft on RA with rolling walker and asst x 1. Not feeling as well today. In atrial fib. To recliner after walk. No c/o CP.   Graylon Good, RN BSN  08/06/2017 12:15 PM

## 2017-08-06 NOTE — Care Management Important Message (Addendum)
Important Message  Patient Details  Name: [name] MRN: [id number] Date of Birth: Jun 17, 1934   Medicare Important Message Given:  Yes  Signed copy    Erenest Rasher, RN 08/06/2017, 12:04 PM

## 2017-08-06 NOTE — Progress Notes (Signed)
   08/06/17 1300  Clinical Encounter Type  Visited With Patient and family together  Visit Type Follow-up  Referral From Chaplain  Consult/Referral To Chaplain  Spiritual Encounters  Spiritual Needs Prayer;Emotional  Stress Factors  Patient Stress Factors Exhausted  Family Stress Factors Exhausted     Pt was a transfer from [facility] to this unit. I met patient immediately she was move to her room . Pt had her son onn-site. Both patient and son were full of testimony about their faith in christ being a great sustenance in this Pt's health journey. Chaplain provided compassionate presence and prayer.  Samy Ryner a Medical sales representative, [employer]

## 2017-08-06 NOTE — Plan of Care (Signed)
Report called to 6E RN

## 2017-08-06 NOTE — Care Management Note (Addendum)
Case Management Note  Patient Details  Name: [name] MRN: [id number] Date of Birth: 1935/02/14  Subjective/Objective:   STEMI, Afib with RVR               Action/Plan: Spoke to pt and son, Ena Dawley at bedside. Offered choice for HH/list provided. Pt agreeable to [facility] for HHPT/RN. Requesting RW and 3n1 bedside commode for home. (declined RW with seat). States husband at home to assist with care. Eliquis is $45 copay made pt aware. She has the Eliquis 30 day free trial card. Contacted [facility] with new referral for DME and HH. [facility] has medication in stock. Son, Ena Dawley and sister provide support as needed.    Expected Discharge Date:              Expected Discharge Plan:  [place]  In-House Referral:  NA  Discharge planning Services  CM Consult  Post Acute Care Choice:  Durable Medical Equipment, Home Health Choice offered to:  Patient  DME Arranged:  3-N-1, Walker rolling DME Agency:  [place]:  PT, RN [facility] Agency:  [place]  Status of Service:  Completed, signed off  If discussed at [place] of Stay Meetings, dates discussed:    Additional Comments:  Erenest Rasher, RN 08/06/2017, 12:01 PM

## 2017-08-07 LAB — BASIC METABOLIC PANEL
Anion gap: 11 (ref 5–15)
BUN: 23 mg/dL — ABNORMAL HIGH (ref 6–20)
CALCIUM: 9.8 mg/dL (ref 8.9–10.3)
CHLORIDE: 107 mmol/L (ref 101–111)
CO2: 21 mmol/L — ABNORMAL LOW (ref 22–32)
CREATININE: 1.68 mg/dL — AB (ref 0.44–1.00)
GFR calc non Af Amer: 28 mL/min — ABNORMAL LOW (ref >60)
GFR, EST AFRICAN AMERICAN: 32 mL/min — AB (ref >60)
Glucose, Bld: 111 mg/dL — ABNORMAL HIGH (ref 65–99)
Potassium: 4.3 mmol/L (ref 3.5–5.1)
Sodium: 139 mmol/L (ref 135–145)

## 2017-08-07 MED ORDER — ATORVASTATIN CALCIUM 80 MG PO TABS: 80.0000 mg | ORAL_TABLET | Freq: Every day | ORAL | 6 refills | Status: DC

## 2017-08-07 MED ORDER — AMIODARONE HCL 200 MG PO TABS
400.0000 mg | ORAL_TABLET | Freq: Two times a day (BID) | ORAL | Status: DC
  Administered 2017-08-07: 400 mg via ORAL
  Filled 2017-08-07: qty 2

## 2017-08-07 MED ORDER — FUROSEMIDE 20 MG PO TABS: 20.0000 mg | ORAL_TABLET | Freq: Every day | ORAL | 6 refills | Status: DC

## 2017-08-07 MED ORDER — NITROGLYCERIN 0.4 MG SL SUBL: 0.4000 mg | SUBLINGUAL_TABLET | SUBLINGUAL | Status: DC | PRN

## 2017-08-07 MED ORDER — NITROGLYCERIN 0.3 MG SL SUBL: 0.3000 mg | SUBLINGUAL_TABLET | SUBLINGUAL | 12 refills | Status: DC | PRN

## 2017-08-07 MED ORDER — CLOPIDOGREL BISULFATE 75 MG PO TABS: 75.0000 mg | ORAL_TABLET | Freq: Every day | ORAL | 6 refills | Status: DC

## 2017-08-07 MED ORDER — APIXABAN 2.5 MG PO TABS: 2.5000 mg | ORAL_TABLET | Freq: Two times a day (BID) | ORAL | 6 refills | Status: DC

## 2017-08-07 MED ORDER — FUROSEMIDE 20 MG PO TABS: 20.0000 mg | ORAL_TABLET | Freq: Every day | ORAL | Status: DC

## 2017-08-07 MED ORDER — CITALOPRAM HYDROBROMIDE 10 MG PO TABS
10.0000 mg | ORAL_TABLET | Freq: Every day | ORAL | 6 refills | Status: DC
Start: 2017-08-08 — End: 2018-05-15

## 2017-08-07 MED ORDER — SPIRONOLACTONE 25 MG PO TABS: 25.0000 mg | ORAL_TABLET | Freq: Every day | ORAL | 6 refills | Status: DC

## 2017-08-07 MED ORDER — AMIODARONE HCL 200 MG PO TABS: ORAL_TABLET | ORAL | 6 refills | Status: DC

## 2017-08-07 MED ORDER — LOSARTAN POTASSIUM 25 MG PO TABS: 25.0000 mg | ORAL_TABLET | Freq: Two times a day (BID) | ORAL | 6 refills | Status: DC

## 2017-08-07 NOTE — Discharge Summary (Addendum)
Advanced Heart Failure Team  Discharge Summary   Patient ID: [name] MRN: [id number], DOB/AGE: September 16, 1934 83 y.o. Admit date: 07/27/2017 D/C date:     08/07/2017   Primary Discharge Diagnoses:  1. Cardiogenic Shock 2. Acute Respiratory Failure 3. 3V CAD with STEMI, s/p 2x DES ramus intermediate.  On aspirin, plavix, eliquis, and high dose statin 4. Bradycardic Arrest  No bb 5. Acute Systolic Heart Failure, ICM 6. A fib RVR On amio 400 mg twice a day x7 days then 200 mg twice a day Eliquis 2.5 mg twice a day ( reduced dose age and creatinine)  7. H/O L Breast Cancer  8. Hematuria  9. Hypokalemia 10. Depression  Hospital Course:   Mary Rigdon Sweeneyis a 83 y.o.femalewith h/o HTN, osteoporosis, L breast cancer s/p XRT and chemo and definitive surgery, HLD, and pre diabetes.   She presented to ED 07/27/17 with CP. Hospital course was complicated by acute respiratory failure, bradycardic arrest, Afib RVR, and cardiogenic shock in the setting of anterior MI.    Found to have acute anterior MI and taken urgently to the cath lab. [place] 07/27/17 as below with 3v CAD, LVEF 35-40%, elevated LVEDP. Underwent PCI with successful stenting of the ramus intermediate artery with DES x 2. Had bradycardic arrest on arrival to ICU with epi/atropine and brief CPR and intubation. As she improved she was extubated on 2/24. An ECHO was completed an showed LVEF 40-45%.     Diuresed with IV lasix and once euvolemic transitioned to po lasix.  HF medications optimized. Renal function was followed closely. She had several episodes of bradycardia so beta blocker was stopped. Later developed A fib RVR so amiodarone was loaded and she was started on eliquis. Chemically converted to NSR but had some break through A fib. Plan to continue amio 400 mg twice a day for 7 days then cut back to 200 mg twice a day. Continue eliquis 2.5 mg twice a day ( reduced dose for age and creatinine).   She will need asa, high dose  statin, and plavix for one year and continue eliquis 2.5 mg twice a day. Also treated with antibiotics for UTI and possible pneumonia.   She was provided with eliquis 30 day free card for discharge.  She will be followed by [facility] for HHRN/HHPT. She will continue to followed closely in the HF clinic and has follow up next week. Plan to repeat BMET and EKG at that time. Discharging to home in stable condition.   [facility] 07/27/2017   Post Atrio lesion is 90% stenosed.  Prox LAD to Dist LAD lesion is 50% stenosed.  Ost 1st Diag lesion is 95% stenosed.  Ost 2nd Diag to 2nd Diag lesion is 90% stenosed.  Mid Cx lesion is 30% stenosed.  Ost Ramus to Ramus lesion is 100% stenosed.  A drug-eluting stent was successfully placed using a STENT SYNERGY DES 2.75X28.  A drug-eluting stent was successfully placed using a STENT SYNERGY DES 2.5X12.  Post intervention, there is a 0% residual stenosis.  There is moderate to severe left ventricular systolic dysfunction.  LV end diastolic pressure is moderately elevated.  The left ventricular ejection fraction is 35-45% by visual estimate. 1. 3 vessel obstructive CAD    - 100% ramus intermediate artery. This is a large vessel and is the culprit lesion.    - 95% small first diagonal    - 90% small second diagonal     - diffusely diseased LAD    -  90% PLOM 2. Moderate to severe LV dysfunction. EF estimated at 35-40%. 3. Elevated LVEDP 4. Successful stenting of the ramus intermediate artery with DES x 2.  Plan; DAPT for one year  ECHO 07/27/2017  Left ventricle: The cavity size was normal. Wall thickness was   increased in a pattern of mild LVH. Systolic function was mildly   to moderately reduced. The estimated ejection fraction was in the   range of 40% to 45%. There is hypokinesis of the lateral and   inferolateral myocardium. Left ventricular diastolic function   parameters were normal for the patient&'s age. - Aortic valve: Mildly calcified  annulus. Trileaflet. There was   mild regurgitation. - Mitral valve: Calcified annulus. There was moderate regurgitation   directed posteriorly. - Left atrium: The atrium was moderately dilated. - Right atrium: Central venous pressure (est): 3 mm Hg. - Atrial septum: A septal defect cannot be excluded. - Tricuspid valve: There was moderate regurgitation. - Pulmonary arteries: Systolic pressure was severely increased. PA   peak pressure: 65 mm Hg (S). - Pericardium, extracardiac: There was no pericardial effusion.  Discharge Weight: 167 pounds.  Discharge Vitals: Blood pressure (!) 106/54, pulse (!) 58, temperature 98 F (36.7 C), temperature source Oral, resp. rate 20, height 5\' 8"  (1.727 m), weight 167 lb 8 oz (76 kg), SpO2 97 %.  Labs: Lab Results  Component Value Date   WBC 8.4 08/03/2017   HGB 9.3 (L) 08/03/2017   HCT 29.0 (L) 08/03/2017   MCV 90.1 08/03/2017   PLT 249 08/03/2017    Recent Labs  Lab 08/06/17 0303 08/07/17 0450  NA 139 139  K 4.2 4.3  CL 108 107  CO2 21* 21*  BUN 27* 23*  CREATININE 1.61* 1.68*  CALCIUM 10.2 9.8  PROT 6.0*  --   BILITOT 1.0  --   ALKPHOS 53  --   ALT 46  --   AST 30  --   GLUCOSE 113* 111*   Lab Results  Component Value Date   CHOL 274 (H) 07/28/2017   HDL 62 07/28/2017   LDLCALC 191 (H) 07/28/2017   TRIG 106 07/28/2017   BNP (last 3 results) No results for input(s): BNP in the last 8760 hours.  ProBNP (last 3 results) No results for input(s): PROBNP in the last 8760 hours.   Diagnostic Studies/Procedures   No results found.  Discharge Medications   Allergies as of 08/07/2017      Reactions   Lisinopril Cough      Medication List    STOP taking these medications   GARLIC PO   vitamin C 2952 MG tablet     TAKE these medications   amiodarone 200 MG tablet Commonly known as:  PACERONE Take 400 mg  Twice a day x 7 days then 200 mg twice a day   anastrozole 1 MG tablet Commonly known as:  ARIMIDEX TAKE  ONE TABLET BY MOUTH ONCE DAILY   apixaban 2.5 MG Tabs tablet Commonly known as:  ELIQUIS Take 1 tablet (2.5 mg total) by mouth 2 (two) times daily.   aspirin 81 MG tablet Take 81 mg by mouth 3 (three) times a week.   atorvastatin 80 MG tablet Commonly known as:  LIPITOR Take 1 tablet (80 mg total) by mouth daily at 6 PM.   CALCIUM + D3 600-800 MG-UNIT Tabs Generic drug:  Calcium Carb-Cholecalciferol Take by mouth.   cholecalciferol 1000 units tablet Commonly known as:  VITAMIN D Take 1,000 Units by  mouth daily. Over the counter   citalopram 10 MG tablet Commonly known as:  CELEXA Take 1 tablet (10 mg total) by mouth daily. Start taking on:  08/08/2017   clopidogrel 75 MG tablet Commonly known as:  PLAVIX Take 1 tablet (75 mg total) by mouth daily. Start taking on:  08/08/2017   co-enzyme Q-10 30 MG capsule Take 30 mg by mouth 3 (three) times daily.   fish oil-omega-3 fatty acids 1000 MG capsule Take 1 g by mouth daily.   furosemide 20 MG tablet Commonly known as:  LASIX Take 1 tablet (20 mg total) by mouth daily. Start taking on:  08/08/2017   losartan 25 MG tablet Commonly known as:  COZAAR Take 1 tablet (25 mg total) by mouth 2 (two) times daily. What changed:    medication strength  how much to take  when to take this   magnesium gluconate 500 MG tablet Commonly known as:  MAGONATE Take 500 mg by mouth 2 (two) times daily.   multivitamin tablet Take 1 tablet by mouth daily.   nitroGLYCERIN 0.3 MG SL tablet Commonly known as:  NITROSTAT Place 1 tablet (0.3 mg total) under the tongue every 5 (five) minutes as needed for chest pain.   spironolactone 25 MG tablet Commonly known as:  ALDACTONE Take 1 tablet (25 mg total) by mouth daily. Start taking on:  08/08/2017            Durable Medical Equipment  (From admission, onward)        Start     Ordered   08/06/17 1232  Heart failure home health orders  (Heart failure home health orders / Face to  face)  Once    Comments:  Heart Failure Follow-up Care:  Verify follow-up appointments per Patient Discharge Instructions. Confirm transportation arranged. Reconcile home medications with discharge medication list. Remove discontinued medications from use. Assist patient/caregiver to manage medications using pill box. Reinforce low sodium food selection Assessments: Vital signs and oxygen saturation at each visit. Assess home environment for safety concerns, caregiver support and availability of low-sodium foods. Consult Education officer, museum, PT/OT, Dietitian, and CNA based on assessments. Perform comprehensive cardiopulmonary assessment. Notify MD for any change in condition or weight gain of 3 pounds in one day or 5 pounds in one week with symptoms. Daily Weights and Symptom Monitoring: Ensure patient has access to scales. Teach patient/caregiver to weigh daily before breakfast and after voiding using same scale and record.    Teach patient/caregiver to track weight and symptoms and when to notify Provider. Activity: Develop individualized activity plan with patient/caregiver.   Cardiac Rehab  Question Answer Comment  Heart Failure Follow-up Care [facility] ([facility] at [phone]   Lab frequency Other see comments   Fax lab results to AHF Clinic at [phone]   Diet Low Sodium Heart Healthy   Fluid restrictions: 2000 mL Fluid      08/06/17 1232   08/06/17 1158  For home use only DME 3 n 1  Once     08/06/17 1158   08/06/17 1158  For home use only DME Walker rolling  Once    Question:  Patient needs a walker to treat with the following condition  Answer:  STEMI (ST elevation myocardial infarction) ([place])   08/06/17 1158      Disposition   The patient will be discharged in stable condition to home. Discharge Instructions    (HEART FAILURE PATIENTS) Call MD:  Anytime you have any of the  following symptoms: 1) 3 pound weight gain in 24 hours or 5 pounds in 1 week 2)  shortness of breath, with or without a dry hacking cough 3) swelling in the hands, feet or stomach 4) if you have to sleep on extra pillows at night in order to breathe.   Complete by:  As directed    Amb Referral to Cardiac Rehabilitation   Complete by:  As directed    Diagnosis:   PTCA STEMI Coronary Stents     Diet - low sodium heart healthy   Complete by:  As directed    Heart Failure patients record your daily weight using the same scale at the same time of day   Complete by:  As directed    Increase activity slowly   Complete by:  As directed      Follow-up Information    [facility] Follow up.   Specialty:  Home Health Services Why:  Home Health RN, Physical Therapy- agency will call to arrange initial visit Contact information: [address] [place] [address] [place]. Go on 08/15/2017.   Specialty:  Cardiology Why:  11:00 AM, [facility], parking code [place] information: [address] [id number] [place] [phone]            Duration of Discharge Encounter: Greater than 35 minutes   Signed, Darrick Grinder NP-C  08/07/2017, 11:20 AM  Patient seen and examined with Darrick Grinder, NP. We discussed all aspects of the encounter. I agree with the assessment and plan as stated above.   She looks great today. Back in NSR. Ambulating independently. Volume status looks good. No CP. No bleeding.   Will d/c home today on amio 400 bid. Suspect she may have some PAF so will need to follow closely. Given LVH on echo will also get PYP scan as outpatient at some point.   Glori Bickers, MD  1:54 PM

## 2017-08-07 NOTE — Progress Notes (Signed)
Reviewed discharge paperwork with patient and son. Discussed new medications and prescriptions, no further questions. Pt is discharging to home with husband, BSC and walker.

## 2017-08-07 NOTE — Progress Notes (Addendum)
Advanced Heart Failure Rounding Note  PCP-Cardiologist: No primary care provider on file.   Subjective:    Events 07/27/17 STEMI with DES x 2 to ramus intermediate artery 07/28/17 Brady-arrest on arrival to ICU require ACLS, CPR, and intubation 07/28/17 Extubated to BiPAP 07/31/17 Started on Rocephin with UTI.   Yesterday amio drip restarted for recurrent A fib. Back in NSR today.   Denies SOB/orthopnea. Wants to go home.   CT chest 07/31/17 with diffuse bilateral interstitial thickening and patchy areas of alveolar space disease. Reviewed CT with pulmonary and radiology. Most likely edema that is clearing.   Objective:   Weight Range: 167 lb 8 oz (76 kg) Body mass index is 25.47 kg/m.   Vital Signs:   Temp:  [97.7 F (36.5 C)-99 F (37.2 C)] 98 F (36.7 C) (03/06 0800) Pulse Rate:  [58-107] 58 (03/06 0800) Resp:  [17-25] 20 (03/06 0800) BP: (106-136)/(53-97) 106/54 (03/06 0800) SpO2:  [97 %-100 %] 97 % (03/06 0800) Weight:  [167 lb 8 oz (76 kg)] 167 lb 8 oz (76 kg) (03/06 0625) Last BM Date: 08/06/17  Weight change: Filed Weights   08/05/17 0500 08/06/17 0600 08/07/17 0625  Weight: 169 lb 5 oz (76.8 kg) 168 lb 3.4 oz (76.3 kg) 167 lb 8 oz (76 kg)    Intake/Output:   Intake/Output Summary (Last 24 hours) at 08/07/2017 0910 Last data filed at 08/07/2017 0400 Gross per 24 hour  Intake 1039.34 ml  Output 151 ml  Net 888.34 ml     Physical Exam   General:  Well appearing. No resp difficulty HEENT: normal Neck: supple. JVP 5-6  Carotids 2+ bilat; no bruits. No lymphadenopathy or thryomegaly appreciated. Cor: PMI nondisplaced. Regular rate & rhythm. No rubs, gallops or murmurs. Lungs: clear on room air.  Abdomen: soft, nontender, nondistended. No hepatosplenomegaly. No bruits or masses. Good bowel sounds. Extremities: no cyanosis, clubbing, rash, edema Neuro: alert & orientedx3, cranial nerves grossly intact. moves all 4 extremities w/o difficulty. Affect  pleasant   Telemetry  NSR  70-80s personally reviewed.   EKG  N/A  Labs    CBC No results for input(s): WBC, NEUTROABS, HGB, HCT, MCV, PLT in the last 72 hours. Basic Metabolic Panel Recent Labs    08/06/17 0303 08/07/17 0450  NA 139 139  K 4.2 4.3  CL 108 107  CO2 21* 21*  GLUCOSE 113* 111*  BUN 27* 23*  CREATININE 1.61* 1.68*  CALCIUM 10.2 9.8   Liver Function Tests Recent Labs    08/06/17 0303  AST 30  ALT 46  ALKPHOS 53  BILITOT 1.0  PROT 6.0*  ALBUMIN 2.5*   No results for input(s): LIPASE, AMYLASE in the last 72 hours. Cardiac Enzymes No results for input(s): CKTOTAL, CKMB, CKMBINDEX, TROPONINI in the last 72 hours.  BNP: BNP (last 3 results) No results for input(s): BNP in the last 8760 hours.  ProBNP (last 3 results) No results for input(s): PROBNP in the last 8760 hours.   D-Dimer No results for input(s): DDIMER in the last 72 hours. Hemoglobin A1C No results for input(s): HGBA1C in the last 72 hours. Fasting Lipid Panel No results for input(s): CHOL, HDL, LDLCALC, TRIG, CHOLHDL, LDLDIRECT in the last 72 hours. Thyroid Function Tests No results for input(s): TSH, T4TOTAL, T3FREE, THYROIDAB in the last 72 hours.  Invalid input(s): FREET3  Other results:   Imaging   No results found.  Medications:     Scheduled Medications: . anastrozole  1  mg Oral Daily  . apixaban  2.5 mg Oral BID  . aspirin  81 mg Oral Daily  . atorvastatin  80 mg Oral q1800  . citalopram  10 mg Oral Daily  . clopidogrel  75 mg Oral Daily  . losartan  25 mg Oral BID  . magnesium gluconate  500 mg Oral BID  . mouth rinse  15 mL Mouth Rinse BID  . multivitamin with minerals  1 tablet Oral Daily  . omega-3 acid ethyl esters  1 g Oral Daily  . pantoprazole  40 mg Oral Daily  . sodium chloride flush  3 mL Intravenous Q12H  . sodium chloride flush  3 mL Intravenous Q12H  . sodium chloride flush  3 mL Intravenous Q12H  . spironolactone  25 mg Oral Daily  .  vitamin C  1,000 mg Oral Daily    Infusions: . sodium chloride Stopped (08/05/17 1200)  . sodium chloride    . sodium chloride    . amiodarone 30 mg/hr (08/07/17 0808)    PRN Medications: sodium chloride, sodium chloride, acetaminophen, ondansetron (ZOFRAN) IV, sodium chloride flush, sodium chloride flush, sodium chloride flush, sorbitol  Patient Profile   [name] is a 83 y.o. female with h/o HTN, osteoporosis, L breast cancer s/p XRT and chemo and definitive surgery, HLD, and pre diabetes.    [name] presented to ED 07/27/17 with CP. Found to have acute anterior MI and taken to cath lab. [place] 07/27/17 as below with 3v CAD, LVEF 35-40%, elevated LVEDP. Underwent PCI with successful stenting of the ramus intermediate artery with DES x 2.  Had bradycardic arrest on arrival to ICU with epi/atropine and brief CPR.   Assessment/Plan   1. Cardiogenic shock - in setting of Anterior MI/STEMI - now s/p DES x 2 to ramus intermediate artery. Extubated 07/28/17  Continue losartan 25 mg twice a day.  Continue spiro 25 mg daily    2. Acute respiratory failure - Initially required intubation.  - Extubated to BiPAP 07/28/17.  - on room air  - CT chest 07/31/17 with diffuse bilateral interstitial thickening and patchy areas of alveolar space disease. Reviewed CT with pulmonary and radiology. Most likely edema that is clearing.  - CXR on 3/1 suggestive of PNA. Abx expanded to Levaquin. PCT 0.12 - Resolved.   3. 3v CAD with STEMI, s/p 2x DES to ramus intermediate - Continue ASA and high dose statin at atorvastatin 80 mg daily. - Discussed with Dr. [place] personally. Have changed brilinta to Plavix.  - Will need DAPT for one year.  - Continue eliquis 2.5 mg twice a day. ( age/creatinine). Provided with 30 day free card.   4. Bradycardic arrest - Occurred on arrival to unit post cath.  - Holding BB. Follow closely on amiodarone - Had several repeat episodes of bradycardia into 30-40s  07/29/17. - No further bardycardia.    5. Acute systolic CHF 2/2 ischemic cardiomyopathy - Echo 07/28/17 LVEF 40-45%, Mild AI, Mod MR, Mod LAE, Mod TR, PA peak pressure 65 mm Hg. Thick appearing ventricle - Volume status stable. Add 20 mg po lasix dailyl    - Off BB with cardiogenic shock.  - Continue spiro 25 mg daily.   - Continue losartan 25 mg/ twice a day.    6. New Afib with RVR - Noted  07/28/17. Back into afib on 3/1 on amio drip with chemical conversion to NSR.  3/5 back in A fib started on amio drip with chemical conversion.  -  Stop amio drip and start amio 400 mg twice a day . Will cut back next week at her follow up.  - This patients CHA2DS2-VASc is at least 6.   - Continue Eliquis 2.5 mg BID. (lower dose with age and creatinine)   7. H/o L breast cancer - Treated by Dr. [name] - Had radiation 7-01/2015, followed by anastrozole and denosumab/Prolia q 6 months.  - Plan to continue anastrozole for total of 5 years per Dr. [name] note.  - Monitoring for osteoporosis. No change.  8. Hematuria - Finished Levaquin for UTI and possible PNA  9. Hypokalemia - K 4.3   10. Depression Continue celexa 10 mg daily  [facility] set up for DC. HHRN and HHPT. DME set up 3N1 and Rolling walker.   Home today.     Medication concerns reviewed with patient and pharmacy team. Barriers identified: Consult  Care Management for eliquis. Care Manager - 45.00 Co-pay. Will give 30 day free card.   Length of Stay: [place], NP  08/07/2017, 9:10 AM  Advanced Heart Failure Team Pager [phone] (M-F; [place])  Please contact [facility] for night-coverage after hours (4p -7a ) and weekends on [email or web address]  Patient seen and examined with Darrick Grinder, NP. We discussed all aspects of the encounter. I agree with the assessment and plan as stated above.   [name] looks great today. Back in NSR. Ambulating independently. Volume status looks good. No CP. No bleeding.   Will d/c home today on amio  400 bid. Suspect [name] may have some PAF so will need to follow closely. Given LVH on echo will also get PYP scan as outpatient at some point.   Glori Bickers, MD  1:54 PM

## 2017-08-08 ENCOUNTER — Telehealth (HOSPITAL_COMMUNITY): Payer: Self-pay

## 2017-08-08 DIAGNOSIS — N390 Urinary tract infection, site not specified: Secondary | ICD-10-CM | POA: Diagnosis not present

## 2017-08-08 DIAGNOSIS — I5021 Acute systolic (congestive) heart failure: Secondary | ICD-10-CM | POA: Diagnosis not present

## 2017-08-08 DIAGNOSIS — I480 Paroxysmal atrial fibrillation: Secondary | ICD-10-CM | POA: Diagnosis not present

## 2017-08-08 DIAGNOSIS — I255 Ischemic cardiomyopathy: Secondary | ICD-10-CM | POA: Diagnosis not present

## 2017-08-08 DIAGNOSIS — E785 Hyperlipidemia, unspecified: Secondary | ICD-10-CM | POA: Diagnosis not present

## 2017-08-08 DIAGNOSIS — I2510 Atherosclerotic heart disease of native coronary artery without angina pectoris: Secondary | ICD-10-CM | POA: Diagnosis not present

## 2017-08-08 DIAGNOSIS — Z8674 Personal history of sudden cardiac arrest: Secondary | ICD-10-CM | POA: Diagnosis not present

## 2017-08-08 DIAGNOSIS — Z95818 Presence of other cardiac implants and grafts: Secondary | ICD-10-CM | POA: Diagnosis not present

## 2017-08-08 DIAGNOSIS — Z853 Personal history of malignant neoplasm of breast: Secondary | ICD-10-CM | POA: Diagnosis not present

## 2017-08-08 DIAGNOSIS — R7303 Prediabetes: Secondary | ICD-10-CM | POA: Diagnosis not present

## 2017-08-08 DIAGNOSIS — Z923 Personal history of irradiation: Secondary | ICD-10-CM | POA: Diagnosis not present

## 2017-08-08 DIAGNOSIS — M810 Age-related osteoporosis without current pathological fracture: Secondary | ICD-10-CM | POA: Diagnosis not present

## 2017-08-08 DIAGNOSIS — Z7982 Long term (current) use of aspirin: Secondary | ICD-10-CM | POA: Diagnosis not present

## 2017-08-08 DIAGNOSIS — I2129 ST elevation (STEMI) myocardial infarction involving other sites: Secondary | ICD-10-CM | POA: Diagnosis not present

## 2017-08-08 DIAGNOSIS — I110 Hypertensive heart disease with heart failure: Secondary | ICD-10-CM | POA: Diagnosis not present

## 2017-08-08 NOTE — Telephone Encounter (Signed)
Called and spoke with husband of patient - stated patient is taking a nap. Patient is unsure what she would like to do as far as Cardiac Rehab as she was just discharged yesterday 08/07/17. Explained to husband our scheduling process and will call to follow up after her follow up appt.

## 2017-08-08 NOTE — Telephone Encounter (Signed)
Patients insurance is active and benefits verified through [facility] - $20.00 co-pay, no deductible, out of pocket amount of $4,400/$5.00 has been met, no co-insurance, and no pre-authorization is required. Passport/reference [phone]  Will contact patient to see if interested in CR. If patient is interested, follow up appt will need to be completed. Once completed, patient will be contacted for scheduling upon review by the RN Navigator.

## 2017-08-09 DIAGNOSIS — N390 Urinary tract infection, site not specified: Secondary | ICD-10-CM | POA: Diagnosis not present

## 2017-08-09 DIAGNOSIS — R7303 Prediabetes: Secondary | ICD-10-CM | POA: Diagnosis not present

## 2017-08-09 DIAGNOSIS — Z7982 Long term (current) use of aspirin: Secondary | ICD-10-CM | POA: Diagnosis not present

## 2017-08-09 DIAGNOSIS — Z95818 Presence of other cardiac implants and grafts: Secondary | ICD-10-CM | POA: Diagnosis not present

## 2017-08-09 DIAGNOSIS — I110 Hypertensive heart disease with heart failure: Secondary | ICD-10-CM | POA: Diagnosis not present

## 2017-08-09 DIAGNOSIS — Z8674 Personal history of sudden cardiac arrest: Secondary | ICD-10-CM | POA: Diagnosis not present

## 2017-08-09 DIAGNOSIS — I2510 Atherosclerotic heart disease of native coronary artery without angina pectoris: Secondary | ICD-10-CM | POA: Diagnosis not present

## 2017-08-09 DIAGNOSIS — M810 Age-related osteoporosis without current pathological fracture: Secondary | ICD-10-CM | POA: Diagnosis not present

## 2017-08-09 DIAGNOSIS — E785 Hyperlipidemia, unspecified: Secondary | ICD-10-CM | POA: Diagnosis not present

## 2017-08-09 DIAGNOSIS — I255 Ischemic cardiomyopathy: Secondary | ICD-10-CM | POA: Diagnosis not present

## 2017-08-09 DIAGNOSIS — I480 Paroxysmal atrial fibrillation: Secondary | ICD-10-CM | POA: Diagnosis not present

## 2017-08-09 DIAGNOSIS — I2129 ST elevation (STEMI) myocardial infarction involving other sites: Secondary | ICD-10-CM | POA: Diagnosis not present

## 2017-08-09 DIAGNOSIS — I5021 Acute systolic (congestive) heart failure: Secondary | ICD-10-CM | POA: Diagnosis not present

## 2017-08-09 DIAGNOSIS — Z853 Personal history of malignant neoplasm of breast: Secondary | ICD-10-CM | POA: Diagnosis not present

## 2017-08-09 DIAGNOSIS — Z923 Personal history of irradiation: Secondary | ICD-10-CM | POA: Diagnosis not present

## 2017-08-10 DIAGNOSIS — I2510 Atherosclerotic heart disease of native coronary artery without angina pectoris: Secondary | ICD-10-CM | POA: Diagnosis not present

## 2017-08-10 DIAGNOSIS — E785 Hyperlipidemia, unspecified: Secondary | ICD-10-CM | POA: Diagnosis not present

## 2017-08-10 DIAGNOSIS — N390 Urinary tract infection, site not specified: Secondary | ICD-10-CM | POA: Diagnosis not present

## 2017-08-10 DIAGNOSIS — I480 Paroxysmal atrial fibrillation: Secondary | ICD-10-CM | POA: Diagnosis not present

## 2017-08-10 DIAGNOSIS — I255 Ischemic cardiomyopathy: Secondary | ICD-10-CM | POA: Diagnosis not present

## 2017-08-10 DIAGNOSIS — I2129 ST elevation (STEMI) myocardial infarction involving other sites: Secondary | ICD-10-CM | POA: Diagnosis not present

## 2017-08-10 DIAGNOSIS — R7303 Prediabetes: Secondary | ICD-10-CM | POA: Diagnosis not present

## 2017-08-10 DIAGNOSIS — Z853 Personal history of malignant neoplasm of breast: Secondary | ICD-10-CM | POA: Diagnosis not present

## 2017-08-10 DIAGNOSIS — Z95818 Presence of other cardiac implants and grafts: Secondary | ICD-10-CM | POA: Diagnosis not present

## 2017-08-10 DIAGNOSIS — I5021 Acute systolic (congestive) heart failure: Secondary | ICD-10-CM | POA: Diagnosis not present

## 2017-08-10 DIAGNOSIS — Z8674 Personal history of sudden cardiac arrest: Secondary | ICD-10-CM | POA: Diagnosis not present

## 2017-08-10 DIAGNOSIS — Z7982 Long term (current) use of aspirin: Secondary | ICD-10-CM | POA: Diagnosis not present

## 2017-08-10 DIAGNOSIS — M810 Age-related osteoporosis without current pathological fracture: Secondary | ICD-10-CM | POA: Diagnosis not present

## 2017-08-10 DIAGNOSIS — Z923 Personal history of irradiation: Secondary | ICD-10-CM | POA: Diagnosis not present

## 2017-08-10 DIAGNOSIS — I110 Hypertensive heart disease with heart failure: Secondary | ICD-10-CM | POA: Diagnosis not present

## 2017-08-13 ENCOUNTER — Telehealth (HOSPITAL_COMMUNITY): Payer: Self-pay | Admitting: *Deleted

## 2017-08-13 DIAGNOSIS — I480 Paroxysmal atrial fibrillation: Secondary | ICD-10-CM | POA: Diagnosis not present

## 2017-08-13 DIAGNOSIS — I2129 ST elevation (STEMI) myocardial infarction involving other sites: Secondary | ICD-10-CM | POA: Diagnosis not present

## 2017-08-13 DIAGNOSIS — I110 Hypertensive heart disease with heart failure: Secondary | ICD-10-CM | POA: Diagnosis not present

## 2017-08-13 DIAGNOSIS — I5021 Acute systolic (congestive) heart failure: Secondary | ICD-10-CM | POA: Diagnosis not present

## 2017-08-13 DIAGNOSIS — Z8674 Personal history of sudden cardiac arrest: Secondary | ICD-10-CM | POA: Diagnosis not present

## 2017-08-13 DIAGNOSIS — I255 Ischemic cardiomyopathy: Secondary | ICD-10-CM | POA: Diagnosis not present

## 2017-08-13 DIAGNOSIS — N390 Urinary tract infection, site not specified: Secondary | ICD-10-CM | POA: Diagnosis not present

## 2017-08-13 DIAGNOSIS — R7303 Prediabetes: Secondary | ICD-10-CM | POA: Diagnosis not present

## 2017-08-13 DIAGNOSIS — E785 Hyperlipidemia, unspecified: Secondary | ICD-10-CM | POA: Diagnosis not present

## 2017-08-13 DIAGNOSIS — Z7982 Long term (current) use of aspirin: Secondary | ICD-10-CM | POA: Diagnosis not present

## 2017-08-13 DIAGNOSIS — M810 Age-related osteoporosis without current pathological fracture: Secondary | ICD-10-CM | POA: Diagnosis not present

## 2017-08-13 DIAGNOSIS — I2510 Atherosclerotic heart disease of native coronary artery without angina pectoris: Secondary | ICD-10-CM | POA: Diagnosis not present

## 2017-08-13 DIAGNOSIS — Z923 Personal history of irradiation: Secondary | ICD-10-CM | POA: Diagnosis not present

## 2017-08-13 DIAGNOSIS — Z853 Personal history of malignant neoplasm of breast: Secondary | ICD-10-CM | POA: Diagnosis not present

## 2017-08-13 DIAGNOSIS — Z95818 Presence of other cardiac implants and grafts: Secondary | ICD-10-CM | POA: Diagnosis not present

## 2017-08-13 NOTE — Telephone Encounter (Signed)
I called Apolonio Schneiders back and she will have patient take an extra lasix today and tomorrow.  She will continue to monitor patient.

## 2017-08-13 NOTE — Telephone Encounter (Signed)
Advanced Heart Failure Triage Encounter  Patient Name: [name]  Date of Call: 08/13/17  Problem: wt gain  [facility] RN Laqueta Due called to report patient had a 7 lb wt gain overnight. She was 161 lbs yesterday and 168 lbs today.  Left and right ankle swelling increased by 3 cm. Patient stated she does feel her sodium intake was higher than normal.  Takes 20 mg of lasix daily and 25 of spiro daily.  No other symptoms reported.   Plan:  Will forward to Darrick Grinder, NP to review and will call Apolonio Schneiders back with her response.   Darron Doom, RN

## 2017-08-13 NOTE — Telephone Encounter (Signed)
   Please call and ask her to take an extra lasix today and tomorrow.   Thanks Amy Clegg NP-C

## 2017-08-14 DIAGNOSIS — I480 Paroxysmal atrial fibrillation: Secondary | ICD-10-CM | POA: Diagnosis not present

## 2017-08-14 DIAGNOSIS — I5021 Acute systolic (congestive) heart failure: Secondary | ICD-10-CM | POA: Diagnosis not present

## 2017-08-14 DIAGNOSIS — I2510 Atherosclerotic heart disease of native coronary artery without angina pectoris: Secondary | ICD-10-CM | POA: Diagnosis not present

## 2017-08-14 DIAGNOSIS — I110 Hypertensive heart disease with heart failure: Secondary | ICD-10-CM | POA: Diagnosis not present

## 2017-08-14 DIAGNOSIS — Z95818 Presence of other cardiac implants and grafts: Secondary | ICD-10-CM | POA: Diagnosis not present

## 2017-08-14 DIAGNOSIS — M810 Age-related osteoporosis without current pathological fracture: Secondary | ICD-10-CM | POA: Diagnosis not present

## 2017-08-14 DIAGNOSIS — Z7982 Long term (current) use of aspirin: Secondary | ICD-10-CM | POA: Diagnosis not present

## 2017-08-14 DIAGNOSIS — E785 Hyperlipidemia, unspecified: Secondary | ICD-10-CM | POA: Diagnosis not present

## 2017-08-14 DIAGNOSIS — N390 Urinary tract infection, site not specified: Secondary | ICD-10-CM | POA: Diagnosis not present

## 2017-08-14 DIAGNOSIS — Z8674 Personal history of sudden cardiac arrest: Secondary | ICD-10-CM | POA: Diagnosis not present

## 2017-08-14 DIAGNOSIS — I255 Ischemic cardiomyopathy: Secondary | ICD-10-CM | POA: Diagnosis not present

## 2017-08-14 DIAGNOSIS — R7303 Prediabetes: Secondary | ICD-10-CM | POA: Diagnosis not present

## 2017-08-14 DIAGNOSIS — I2129 ST elevation (STEMI) myocardial infarction involving other sites: Secondary | ICD-10-CM | POA: Diagnosis not present

## 2017-08-14 DIAGNOSIS — Z923 Personal history of irradiation: Secondary | ICD-10-CM | POA: Diagnosis not present

## 2017-08-14 DIAGNOSIS — Z853 Personal history of malignant neoplasm of breast: Secondary | ICD-10-CM | POA: Diagnosis not present

## 2017-08-15 ENCOUNTER — Other Ambulatory Visit (HOSPITAL_COMMUNITY): Payer: Self-pay | Admitting: *Deleted

## 2017-08-15 ENCOUNTER — Ambulatory Visit (HOSPITAL_COMMUNITY)
Admission: RE | Admit: 2017-08-15 | Discharge: 2017-08-15 | Disposition: A | Discharge status: Home / Self Care | Payer: Medicare Other | Source: Ambulatory Visit | Attending: Internal Medicine | Admitting: Internal Medicine

## 2017-08-15 ENCOUNTER — Encounter (HOSPITAL_COMMUNITY): Payer: Self-pay

## 2017-08-15 VITALS — BP 150/68 | HR 57 | Wt 166.2 lb

## 2017-08-15 DIAGNOSIS — Z9889 Other specified postprocedural states: Secondary | ICD-10-CM | POA: Insufficient documentation

## 2017-08-15 DIAGNOSIS — Z7982 Long term (current) use of aspirin: Secondary | ICD-10-CM | POA: Insufficient documentation

## 2017-08-15 DIAGNOSIS — Z7901 Long term (current) use of anticoagulants: Secondary | ICD-10-CM | POA: Insufficient documentation

## 2017-08-15 DIAGNOSIS — Z79899 Other long term (current) drug therapy: Secondary | ICD-10-CM | POA: Insufficient documentation

## 2017-08-15 DIAGNOSIS — I5022 Chronic systolic (congestive) heart failure: Secondary | ICD-10-CM | POA: Diagnosis not present

## 2017-08-15 DIAGNOSIS — Z7902 Long term (current) use of antithrombotics/antiplatelets: Secondary | ICD-10-CM | POA: Diagnosis not present

## 2017-08-15 DIAGNOSIS — I480 Paroxysmal atrial fibrillation: Secondary | ICD-10-CM | POA: Diagnosis not present

## 2017-08-15 DIAGNOSIS — Z9221 Personal history of antineoplastic chemotherapy: Secondary | ICD-10-CM | POA: Insufficient documentation

## 2017-08-15 DIAGNOSIS — Z87442 Personal history of urinary calculi: Secondary | ICD-10-CM | POA: Insufficient documentation

## 2017-08-15 DIAGNOSIS — M810 Age-related osteoporosis without current pathological fracture: Secondary | ICD-10-CM | POA: Diagnosis not present

## 2017-08-15 DIAGNOSIS — I255 Ischemic cardiomyopathy: Secondary | ICD-10-CM | POA: Insufficient documentation

## 2017-08-15 DIAGNOSIS — K219 Gastro-esophageal reflux disease without esophagitis: Secondary | ICD-10-CM | POA: Insufficient documentation

## 2017-08-15 DIAGNOSIS — Z923 Personal history of irradiation: Secondary | ICD-10-CM | POA: Insufficient documentation

## 2017-08-15 DIAGNOSIS — Z955 Presence of coronary angioplasty implant and graft: Secondary | ICD-10-CM | POA: Insufficient documentation

## 2017-08-15 DIAGNOSIS — Z853 Personal history of malignant neoplasm of breast: Secondary | ICD-10-CM | POA: Diagnosis not present

## 2017-08-15 DIAGNOSIS — I2510 Atherosclerotic heart disease of native coronary artery without angina pectoris: Secondary | ICD-10-CM | POA: Diagnosis not present

## 2017-08-15 DIAGNOSIS — I110 Hypertensive heart disease with heart failure: Secondary | ICD-10-CM | POA: Diagnosis not present

## 2017-08-15 DIAGNOSIS — Z8711 Personal history of peptic ulcer disease: Secondary | ICD-10-CM | POA: Diagnosis not present

## 2017-08-15 DIAGNOSIS — F419 Anxiety disorder, unspecified: Secondary | ICD-10-CM | POA: Diagnosis not present

## 2017-08-15 LAB — BASIC METABOLIC PANEL
ANION GAP: 9 (ref 5–15)
BUN: 26 mg/dL — AB (ref 6–20)
CHLORIDE: 107 mmol/L (ref 101–111)
CO2: 22 mmol/L (ref 22–32)
Calcium: 10.9 mg/dL — ABNORMAL HIGH (ref 8.9–10.3)
Creatinine, Ser: 1.92 mg/dL — ABNORMAL HIGH (ref 0.44–1.00)
GFR calc Af Amer: 27 mL/min — ABNORMAL LOW (ref >60)
GFR calc non Af Amer: 24 mL/min — ABNORMAL LOW (ref >60)
GLUCOSE: 139 mg/dL — AB (ref 65–99)
POTASSIUM: 4.5 mmol/L (ref 3.5–5.1)
Sodium: 138 mmol/L (ref 135–145)

## 2017-08-15 LAB — BRAIN NATRIURETIC PEPTIDE: B Natriuretic Peptide: 481.1 pg/mL — ABNORMAL HIGH (ref 0.0–100.0)

## 2017-08-15 MED ORDER — AMIODARONE HCL 200 MG PO TABS: 200.0000 mg | ORAL_TABLET | Freq: Every day | ORAL | 3 refills | Status: DC

## 2017-08-15 NOTE — Progress Notes (Signed)
PCP: Primary HF Cardiologist: Dr Sharlotte Alamo  HPI: [name] a 83 y.o.femalewith h/o HTN, osteoporosis, L breast cancer s/p XRT and chemo and definitive surgery, HLD, and pre diabetes.   Admitted to [facility]  ED 07/27/17 with CP. Hospital course was complicated by acute respiratory failure, bradycardic arrest, Afib RVR, and cardiogenic shock in the setting of anterior MI.  Found to have acute anterior MI and taken urgently to the cath lab. [place] 07/27/17 as below with 3v CAD, LVEF 35-40%, elevated LVEDP. Underwent PCI with successful stenting of the ramus intermediate artery with DES x 2. Had bradycardic arrest on arrival to ICU with epi/atropine and brief CPR and intubation. As she improved she was extubated on 2/24. An ECHO was completed an showed LVEF 40-45%.    Diuresed with IV lasix and once euvolemic transitioned to po lasix.  HF medications optimized. Renal function was followed closely. She had several episodes of bradycardia so beta blocker was stopped. Later developed A fib RVR so amiodarone was loaded and she was started on eliquis. Chemically converted to NSR but had some break through A fib. Plan to continue amio 400 mg twice a day for 7 days then cut back to 200 mg twice a day. Continue eliquis 2.5 mg twice a day ( reduced dose for age and creatinine). Discharge 167 pounds.   Today she returns for post hospital HF follow up. Overall feeling fine. Denies SOB/PND/Orthopnea. No bleeding problems. Denies chest pain. Appetite ok. No fever or chills. Weight at home has been 161-168 pounds. Taking all medications. AHC following.    ECHO 07/27/2017 EF 40-45%   LHC 07/27/2017   Post Atrio lesion is 90% stenosed.  Prox LAD to Dist LAD lesion is 50% stenosed.  Ost 1st Diag lesion is 95% stenosed.  Ost 2nd Diag to 2nd Diag lesion is 90% stenosed.  Mid Cx lesion is 30% stenosed.  Ost Ramus to Ramus lesion is 100% stenosed.  A drug-eluting stent was successfully placed using a STENT  SYNERGY DES 2.75X28.  A drug-eluting stent was successfully placed using a STENT SYNERGY DES 2.5X12.  Post intervention, there is a 0% residual stenosis.  There is moderate to severe left ventricular systolic dysfunction.  LV end diastolic pressure is moderately elevated.  The left ventricular ejection fraction is 35-45% by visual estimate. 1. 3 vessel obstructive CAD - 100% ramus intermediate artery. This is a large vessel and is the culprit lesion. - 95% small first diagonal - 90% small second diagonal  - diffusely diseased LAD - 90% PLOM 2. Moderate to severe LV dysfunction. EF estimated at 35-40%. 3. Elevated LVEDP 4. Successful stenting of the ramus intermediate artery with DES x 2.  Plan; DAPT for one year   ROS: All systems negative except as listed in HPI, PMH and Problem List.  SH:  Social History   Socioeconomic History  . Marital status: Married    Spouse name: Not on file  . Number of children: Not on file  . Years of education: Not on file  . Highest education level: Not on file  Social Needs  . Financial resource strain: Not on file  . Food insecurity - worry: Not on file  . Food insecurity - inability: Not on file  . Transportation needs - medical: Not on file  . Transportation needs - non-medical: Not on file  Occupational History  . Occupation: retired  Tobacco Use  . Smoking status: Never Smoker  . Smokeless tobacco: Never Used  Substance and  Sexual Activity  . Alcohol use: No  . Drug use: No  . Sexual activity: Not on file  Other Topics Concern  . Not on file  Social History Narrative  . Not on file    FH:  Family History  Problem Relation Age of Onset  . Pancreatic cancer Mother        pancreastic  . Stroke Father   . Hypertension Brother   . Hypertension Sister   . Hypertension Brother   . Heart disease Brother   . Hypertension Sister     Past Medical History:  Diagnosis Date  . Anxiety   . Breast cancer  ([place])   . Breast cancer of lower-outer quadrant of left female breast ([place]) 10/11/2014  . GERD (gastroesophageal reflux disease)   . Hypertension   . Personal history of radiation therapy    2016  . PONV (postoperative nausea and vomiting)   . PUD (peptic ulcer disease)   . Stone, kidney     Current Outpatient Medications  Medication Sig Dispense Refill  . amiodarone (PACERONE) 200 MG tablet Take 400 mg  Twice a day x 7 days then 200 mg twice a day 70 tablet 6  . anastrozole (ARIMIDEX) 1 MG tablet TAKE ONE TABLET BY MOUTH ONCE DAILY 90 tablet 4  . apixaban (ELIQUIS) 2.5 MG TABS tablet Take 1 tablet (2.5 mg total) by mouth 2 (two) times daily. 60 tablet 6  . aspirin 81 MG tablet Take 81 mg by mouth 3 (three) times a week.     Marland Kitchen atorvastatin (LIPITOR) 80 MG tablet Take 1 tablet (80 mg total) by mouth daily at 6 PM. 30 tablet 6  . Calcium Carb-Cholecalciferol (CALCIUM + D3) 600-800 MG-UNIT TABS Take by mouth.    . cholecalciferol (VITAMIN D) 1000 UNITS tablet Take 1,000 Units by mouth daily. Over the counter    . citalopram (CELEXA) 10 MG tablet Take 1 tablet (10 mg total) by mouth daily. 30 tablet 6  . clopidogrel (PLAVIX) 75 MG tablet Take 1 tablet (75 mg total) by mouth daily. 30 tablet 6  . co-enzyme Q-10 30 MG capsule Take 30 mg by mouth 3 (three) times daily.    . fish oil-omega-3 fatty acids 1000 MG capsule Take 1 g by mouth daily.    . furosemide (LASIX) 20 MG tablet Take 1 tablet (20 mg total) by mouth daily. 30 tablet 6  . losartan (COZAAR) 25 MG tablet Take 1 tablet (25 mg total) by mouth 2 (two) times daily. 60 tablet 6  . magnesium gluconate (MAGONATE) 500 MG tablet Take 500 mg by mouth 2 (two) times daily.    . Multiple Vitamin (MULTIVITAMIN) tablet Take 1 tablet by mouth daily.    . nitroGLYCERIN (NITROSTAT) 0.3 MG SL tablet Place 1 tablet (0.3 mg total) under the tongue every 5 (five) minutes as needed for chest pain. 90 tablet 12  . spironolactone (ALDACTONE) 25 MG tablet  Take 1 tablet (25 mg total) by mouth daily. 30 tablet 6   No current facility-administered medications for this encounter.     Vitals:   08/15/17 1124  BP: (!) 150/68  Pulse: (!) 57  SpO2: 99%  Weight: 166 lb 3.2 oz (75.4 kg)   ReDS Vest - 08/15/17 1200      ReDS Vest   MR   No    Estimated volume prior to reading  Med    Fitting Posture  Sitting    Height Marker  Tall  Ruler Value  [place]  Aligned    ReDS Value  35        PHYSICAL EXAM: General:  Well appearing. No resp difficulty. Walked in the clinic.  HEENT: normal Neck: supple. JVP flat. Carotids 2+ bilaterally; no bruits. No lymphadenopathy or thryomegaly appreciated. Cor: PMI normal. Regular rate & rhythm. No rubs, gallops or murmurs. Lungs: clear Abdomen: soft, nontender, nondistended. No hepatosplenomegaly. No bruits or masses. Good bowel sounds. Extremities: no cyanosis, clubbing, rash, edema Neuro: alert & orientedx3, cranial nerves grossly intact. Moves all 4 extremities w/o difficulty. Affect pleasant.   ECG: Sinus Brady 49 bpm   ASSESSMENT & PLAN: 1. CAD No S/S ischemia.  3V CAD with STEMI 07/2017 S/P DES x2  Continue aspirin, plavix, eliquis, and high dose statin. DAPT x1 year  2. Chronic Systolic Heart Failure, ICM- ECHO EF 07/2017 EF 40-45% NYHA II. Volume status stable. Continue 20 mg po lasix daily.  No bb for now with recent cardiogenic shock Continue losartan 25 mg twice a day.  Continue 25 mg spironolactone daily Plan to repeat ECHO in 3 months.   3. PAF S/P DC-CV.  Maintaining SR. Cut back amio to 200 mg daily Continue eliqis 2.5 mg twice a day  4. H.O Left Breast Cancer.    Follow up in 2 weeks with Dr Haroldine Laws. Check BMET and BNP today.  Greater than 50% of the (total minutes 25*) visit spent in counseling/coordination of care regarding testing and medication changes.   Amy Clegg NP-C  4:46 PM

## 2017-08-15 NOTE — Patient Instructions (Addendum)
Routine lab work today. Will notify you of abnormal results, otherwise no news is good news!  DECREASE Amiodarone to 200 mg tablet ONCE daily.  Follow up 2 weeks.  _________________________________________________________ [name] Code: 9002  Take all medication as prescribed the day of your appointment. Bring all medications with you to your appointment.  Do the following things EVERYDAY: 1) Weigh yourself in the morning before breakfast. Write it down and keep it in a log. 2) Take your medicines as prescribed 3) Eat low salt foods-Limit salt (sodium) to 2000 mg per day.  4) Stay as active as you can everyday 5) Limit all fluids for the day to less than 2 liters

## 2017-08-16 ENCOUNTER — Telehealth (HOSPITAL_COMMUNITY): Payer: Self-pay | Admitting: *Deleted

## 2017-08-16 DIAGNOSIS — I2129 ST elevation (STEMI) myocardial infarction involving other sites: Secondary | ICD-10-CM | POA: Diagnosis not present

## 2017-08-16 DIAGNOSIS — Z853 Personal history of malignant neoplasm of breast: Secondary | ICD-10-CM | POA: Diagnosis not present

## 2017-08-16 DIAGNOSIS — I5022 Chronic systolic (congestive) heart failure: Secondary | ICD-10-CM

## 2017-08-16 DIAGNOSIS — I480 Paroxysmal atrial fibrillation: Secondary | ICD-10-CM | POA: Diagnosis not present

## 2017-08-16 DIAGNOSIS — E785 Hyperlipidemia, unspecified: Secondary | ICD-10-CM | POA: Diagnosis not present

## 2017-08-16 DIAGNOSIS — Z8674 Personal history of sudden cardiac arrest: Secondary | ICD-10-CM | POA: Diagnosis not present

## 2017-08-16 DIAGNOSIS — R7303 Prediabetes: Secondary | ICD-10-CM | POA: Diagnosis not present

## 2017-08-16 DIAGNOSIS — I110 Hypertensive heart disease with heart failure: Secondary | ICD-10-CM | POA: Diagnosis not present

## 2017-08-16 DIAGNOSIS — M810 Age-related osteoporosis without current pathological fracture: Secondary | ICD-10-CM | POA: Diagnosis not present

## 2017-08-16 DIAGNOSIS — Z923 Personal history of irradiation: Secondary | ICD-10-CM | POA: Diagnosis not present

## 2017-08-16 DIAGNOSIS — N390 Urinary tract infection, site not specified: Secondary | ICD-10-CM | POA: Diagnosis not present

## 2017-08-16 DIAGNOSIS — I2510 Atherosclerotic heart disease of native coronary artery without angina pectoris: Secondary | ICD-10-CM | POA: Diagnosis not present

## 2017-08-16 DIAGNOSIS — I255 Ischemic cardiomyopathy: Secondary | ICD-10-CM | POA: Diagnosis not present

## 2017-08-16 DIAGNOSIS — I5021 Acute systolic (congestive) heart failure: Secondary | ICD-10-CM | POA: Diagnosis not present

## 2017-08-16 DIAGNOSIS — Z7982 Long term (current) use of aspirin: Secondary | ICD-10-CM | POA: Diagnosis not present

## 2017-08-16 DIAGNOSIS — Z95818 Presence of other cardiac implants and grafts: Secondary | ICD-10-CM | POA: Diagnosis not present

## 2017-08-16 MED ORDER — FUROSEMIDE 20 MG PO TABS: 20.0000 mg | ORAL_TABLET | ORAL | 6 refills | Status: DC

## 2017-08-16 NOTE — Telephone Encounter (Signed)
Result Notes for Basic metabolic panel   Notes recorded by Darron Doom, RN on 08/16/2017 at 11:12 AM EDT Patient's son called regarding lab results. Results given and he is agreeable with plan. Medication updated, bmet ordered, and lab appt scheduled. ------  Notes recorded by Conrad [place], NP on 08/15/2017 at 1:03 PM EDT Renal function elevated has had extra lasix . Please call and switch lasix to 20 mg every other day. Repeat BMEt in 7 days.

## 2017-08-20 ENCOUNTER — Telehealth (HOSPITAL_COMMUNITY): Payer: Self-pay

## 2017-08-20 DIAGNOSIS — E785 Hyperlipidemia, unspecified: Secondary | ICD-10-CM | POA: Diagnosis not present

## 2017-08-20 DIAGNOSIS — Z95818 Presence of other cardiac implants and grafts: Secondary | ICD-10-CM | POA: Diagnosis not present

## 2017-08-20 DIAGNOSIS — I480 Paroxysmal atrial fibrillation: Secondary | ICD-10-CM | POA: Diagnosis not present

## 2017-08-20 DIAGNOSIS — Z8674 Personal history of sudden cardiac arrest: Secondary | ICD-10-CM | POA: Diagnosis not present

## 2017-08-20 DIAGNOSIS — Z923 Personal history of irradiation: Secondary | ICD-10-CM | POA: Diagnosis not present

## 2017-08-20 DIAGNOSIS — M810 Age-related osteoporosis without current pathological fracture: Secondary | ICD-10-CM | POA: Diagnosis not present

## 2017-08-20 DIAGNOSIS — I2510 Atherosclerotic heart disease of native coronary artery without angina pectoris: Secondary | ICD-10-CM | POA: Diagnosis not present

## 2017-08-20 DIAGNOSIS — I2129 ST elevation (STEMI) myocardial infarction involving other sites: Secondary | ICD-10-CM | POA: Diagnosis not present

## 2017-08-20 DIAGNOSIS — I255 Ischemic cardiomyopathy: Secondary | ICD-10-CM | POA: Diagnosis not present

## 2017-08-20 DIAGNOSIS — N390 Urinary tract infection, site not specified: Secondary | ICD-10-CM | POA: Diagnosis not present

## 2017-08-20 DIAGNOSIS — Z7982 Long term (current) use of aspirin: Secondary | ICD-10-CM | POA: Diagnosis not present

## 2017-08-20 DIAGNOSIS — Z853 Personal history of malignant neoplasm of breast: Secondary | ICD-10-CM | POA: Diagnosis not present

## 2017-08-20 DIAGNOSIS — I110 Hypertensive heart disease with heart failure: Secondary | ICD-10-CM | POA: Diagnosis not present

## 2017-08-20 DIAGNOSIS — I5021 Acute systolic (congestive) heart failure: Secondary | ICD-10-CM | POA: Diagnosis not present

## 2017-08-20 DIAGNOSIS — R7303 Prediabetes: Secondary | ICD-10-CM | POA: Diagnosis not present

## 2017-08-20 NOTE — Telephone Encounter (Signed)
Called and spoke with patient in regards to Cardiac Rehab - Patient is interested in the program although patient asked which Dr referred her. I explained to patient that Dr.Jordan signed the referral at the time of discharge. She then stated she wants Dr.Bensimhon to sign the order. I explained to patient that at the time Dr.Bensimhon was not available to sign order so another Cardiologist was available to sign. She would not accept that. I then told patient when she see's Dr.Bensimhon on 08/29/2017 to ask him to place a new order with his signature. Patient verbally stated she understands. Paperwork in Paramedic.

## 2017-08-21 ENCOUNTER — Telehealth (HOSPITAL_COMMUNITY): Payer: Self-pay

## 2017-08-21 NOTE — Telephone Encounter (Signed)
Patient called in regards to Cardiac Rehab - Patient stated she is fine with signing up for Cardiac Rehab under Dr.Jordans referral. Scheduled orientation on 10/10/2017 at 8:45am. Patient will attend the 2:45pm exc class. Mailed packet.

## 2017-08-22 ENCOUNTER — Ambulatory Visit (HOSPITAL_COMMUNITY)
Admission: RE | Admit: 2017-08-22 | Discharge: 2017-08-22 | Disposition: A | Discharge status: Home / Self Care | Payer: Medicare Other | Source: Ambulatory Visit | Attending: Cardiology | Admitting: Cardiology

## 2017-08-22 DIAGNOSIS — I5022 Chronic systolic (congestive) heart failure: Secondary | ICD-10-CM | POA: Insufficient documentation

## 2017-08-22 LAB — BASIC METABOLIC PANEL
ANION GAP: 8 (ref 5–15)
BUN: 23 mg/dL — AB (ref 6–20)
CO2: 23 mmol/L (ref 22–32)
Calcium: 10.4 mg/dL — ABNORMAL HIGH (ref 8.9–10.3)
Chloride: 106 mmol/L (ref 101–111)
Creatinine, Ser: 1.75 mg/dL — ABNORMAL HIGH (ref 0.44–1.00)
GFR, EST AFRICAN AMERICAN: 31 mL/min — AB (ref >60)
GFR, EST NON AFRICAN AMERICAN: 26 mL/min — AB (ref >60)
Glucose, Bld: 147 mg/dL — ABNORMAL HIGH (ref 65–99)
POTASSIUM: 4.6 mmol/L (ref 3.5–5.1)
SODIUM: 137 mmol/L (ref 135–145)

## 2017-08-23 DIAGNOSIS — I255 Ischemic cardiomyopathy: Secondary | ICD-10-CM | POA: Diagnosis not present

## 2017-08-23 DIAGNOSIS — N390 Urinary tract infection, site not specified: Secondary | ICD-10-CM | POA: Diagnosis not present

## 2017-08-23 DIAGNOSIS — I5021 Acute systolic (congestive) heart failure: Secondary | ICD-10-CM | POA: Diagnosis not present

## 2017-08-23 DIAGNOSIS — I2510 Atherosclerotic heart disease of native coronary artery without angina pectoris: Secondary | ICD-10-CM | POA: Diagnosis not present

## 2017-08-23 DIAGNOSIS — Z8674 Personal history of sudden cardiac arrest: Secondary | ICD-10-CM | POA: Diagnosis not present

## 2017-08-23 DIAGNOSIS — Z95818 Presence of other cardiac implants and grafts: Secondary | ICD-10-CM | POA: Diagnosis not present

## 2017-08-23 DIAGNOSIS — E785 Hyperlipidemia, unspecified: Secondary | ICD-10-CM | POA: Diagnosis not present

## 2017-08-23 DIAGNOSIS — I2129 ST elevation (STEMI) myocardial infarction involving other sites: Secondary | ICD-10-CM | POA: Diagnosis not present

## 2017-08-23 DIAGNOSIS — Z7982 Long term (current) use of aspirin: Secondary | ICD-10-CM | POA: Diagnosis not present

## 2017-08-23 DIAGNOSIS — Z853 Personal history of malignant neoplasm of breast: Secondary | ICD-10-CM | POA: Diagnosis not present

## 2017-08-23 DIAGNOSIS — I110 Hypertensive heart disease with heart failure: Secondary | ICD-10-CM | POA: Diagnosis not present

## 2017-08-23 DIAGNOSIS — R7303 Prediabetes: Secondary | ICD-10-CM | POA: Diagnosis not present

## 2017-08-23 DIAGNOSIS — I480 Paroxysmal atrial fibrillation: Secondary | ICD-10-CM | POA: Diagnosis not present

## 2017-08-23 DIAGNOSIS — M810 Age-related osteoporosis without current pathological fracture: Secondary | ICD-10-CM | POA: Diagnosis not present

## 2017-08-23 DIAGNOSIS — Z923 Personal history of irradiation: Secondary | ICD-10-CM | POA: Diagnosis not present

## 2017-08-27 DIAGNOSIS — I480 Paroxysmal atrial fibrillation: Secondary | ICD-10-CM | POA: Diagnosis not present

## 2017-08-27 DIAGNOSIS — Z7982 Long term (current) use of aspirin: Secondary | ICD-10-CM | POA: Diagnosis not present

## 2017-08-27 DIAGNOSIS — I2129 ST elevation (STEMI) myocardial infarction involving other sites: Secondary | ICD-10-CM | POA: Diagnosis not present

## 2017-08-27 DIAGNOSIS — M810 Age-related osteoporosis without current pathological fracture: Secondary | ICD-10-CM | POA: Diagnosis not present

## 2017-08-27 DIAGNOSIS — Z923 Personal history of irradiation: Secondary | ICD-10-CM | POA: Diagnosis not present

## 2017-08-27 DIAGNOSIS — E785 Hyperlipidemia, unspecified: Secondary | ICD-10-CM | POA: Diagnosis not present

## 2017-08-27 DIAGNOSIS — I2510 Atherosclerotic heart disease of native coronary artery without angina pectoris: Secondary | ICD-10-CM | POA: Diagnosis not present

## 2017-08-27 DIAGNOSIS — I5021 Acute systolic (congestive) heart failure: Secondary | ICD-10-CM | POA: Diagnosis not present

## 2017-08-27 DIAGNOSIS — Z95818 Presence of other cardiac implants and grafts: Secondary | ICD-10-CM | POA: Diagnosis not present

## 2017-08-27 DIAGNOSIS — R7303 Prediabetes: Secondary | ICD-10-CM | POA: Diagnosis not present

## 2017-08-27 DIAGNOSIS — Z8674 Personal history of sudden cardiac arrest: Secondary | ICD-10-CM | POA: Diagnosis not present

## 2017-08-27 DIAGNOSIS — N390 Urinary tract infection, site not specified: Secondary | ICD-10-CM | POA: Diagnosis not present

## 2017-08-27 DIAGNOSIS — Z853 Personal history of malignant neoplasm of breast: Secondary | ICD-10-CM | POA: Diagnosis not present

## 2017-08-27 DIAGNOSIS — I110 Hypertensive heart disease with heart failure: Secondary | ICD-10-CM | POA: Diagnosis not present

## 2017-08-27 DIAGNOSIS — I255 Ischemic cardiomyopathy: Secondary | ICD-10-CM | POA: Diagnosis not present

## 2017-08-28 DIAGNOSIS — M810 Age-related osteoporosis without current pathological fracture: Secondary | ICD-10-CM | POA: Diagnosis not present

## 2017-08-28 DIAGNOSIS — I5021 Acute systolic (congestive) heart failure: Secondary | ICD-10-CM | POA: Diagnosis not present

## 2017-08-28 DIAGNOSIS — E785 Hyperlipidemia, unspecified: Secondary | ICD-10-CM | POA: Diagnosis not present

## 2017-08-28 DIAGNOSIS — I2510 Atherosclerotic heart disease of native coronary artery without angina pectoris: Secondary | ICD-10-CM | POA: Diagnosis not present

## 2017-08-28 DIAGNOSIS — Z923 Personal history of irradiation: Secondary | ICD-10-CM | POA: Diagnosis not present

## 2017-08-28 DIAGNOSIS — I2129 ST elevation (STEMI) myocardial infarction involving other sites: Secondary | ICD-10-CM | POA: Diagnosis not present

## 2017-08-28 DIAGNOSIS — I480 Paroxysmal atrial fibrillation: Secondary | ICD-10-CM | POA: Diagnosis not present

## 2017-08-28 DIAGNOSIS — R7303 Prediabetes: Secondary | ICD-10-CM | POA: Diagnosis not present

## 2017-08-28 DIAGNOSIS — Z7982 Long term (current) use of aspirin: Secondary | ICD-10-CM | POA: Diagnosis not present

## 2017-08-28 DIAGNOSIS — I110 Hypertensive heart disease with heart failure: Secondary | ICD-10-CM | POA: Diagnosis not present

## 2017-08-28 DIAGNOSIS — Z8674 Personal history of sudden cardiac arrest: Secondary | ICD-10-CM | POA: Diagnosis not present

## 2017-08-28 DIAGNOSIS — N390 Urinary tract infection, site not specified: Secondary | ICD-10-CM | POA: Diagnosis not present

## 2017-08-28 DIAGNOSIS — Z853 Personal history of malignant neoplasm of breast: Secondary | ICD-10-CM | POA: Diagnosis not present

## 2017-08-28 DIAGNOSIS — Z95818 Presence of other cardiac implants and grafts: Secondary | ICD-10-CM | POA: Diagnosis not present

## 2017-08-28 DIAGNOSIS — I255 Ischemic cardiomyopathy: Secondary | ICD-10-CM | POA: Diagnosis not present

## 2017-08-29 ENCOUNTER — Ambulatory Visit (HOSPITAL_COMMUNITY)
Admission: RE | Admit: 2017-08-29 | Discharge: 2017-08-29 | Disposition: A | Discharge status: Home / Self Care | Payer: Medicare Other | Source: Ambulatory Visit | Attending: Cardiology | Admitting: Cardiology

## 2017-08-29 ENCOUNTER — Encounter (HOSPITAL_COMMUNITY): Payer: Self-pay

## 2017-08-29 VITALS — BP 128/60 | HR 62 | Wt 163.8 lb

## 2017-08-29 DIAGNOSIS — E785 Hyperlipidemia, unspecified: Secondary | ICD-10-CM | POA: Diagnosis not present

## 2017-08-29 DIAGNOSIS — M810 Age-related osteoporosis without current pathological fracture: Secondary | ICD-10-CM | POA: Diagnosis not present

## 2017-08-29 DIAGNOSIS — R001 Bradycardia, unspecified: Secondary | ICD-10-CM | POA: Diagnosis not present

## 2017-08-29 DIAGNOSIS — Z9889 Other specified postprocedural states: Secondary | ICD-10-CM | POA: Insufficient documentation

## 2017-08-29 DIAGNOSIS — K219 Gastro-esophageal reflux disease without esophagitis: Secondary | ICD-10-CM | POA: Insufficient documentation

## 2017-08-29 DIAGNOSIS — I252 Old myocardial infarction: Secondary | ICD-10-CM | POA: Diagnosis not present

## 2017-08-29 DIAGNOSIS — Z853 Personal history of malignant neoplasm of breast: Secondary | ICD-10-CM | POA: Insufficient documentation

## 2017-08-29 DIAGNOSIS — Z7901 Long term (current) use of anticoagulants: Secondary | ICD-10-CM | POA: Insufficient documentation

## 2017-08-29 DIAGNOSIS — I110 Hypertensive heart disease with heart failure: Secondary | ICD-10-CM | POA: Diagnosis not present

## 2017-08-29 DIAGNOSIS — I5022 Chronic systolic (congestive) heart failure: Secondary | ICD-10-CM | POA: Diagnosis not present

## 2017-08-29 DIAGNOSIS — I2510 Atherosclerotic heart disease of native coronary artery without angina pectoris: Secondary | ICD-10-CM | POA: Diagnosis not present

## 2017-08-29 DIAGNOSIS — R9431 Abnormal electrocardiogram [ECG] [EKG]: Secondary | ICD-10-CM | POA: Insufficient documentation

## 2017-08-29 DIAGNOSIS — Z7982 Long term (current) use of aspirin: Secondary | ICD-10-CM | POA: Insufficient documentation

## 2017-08-29 DIAGNOSIS — Z79899 Other long term (current) drug therapy: Secondary | ICD-10-CM | POA: Diagnosis not present

## 2017-08-29 DIAGNOSIS — Z923 Personal history of irradiation: Secondary | ICD-10-CM | POA: Diagnosis not present

## 2017-08-29 DIAGNOSIS — Z7902 Long term (current) use of antithrombotics/antiplatelets: Secondary | ICD-10-CM | POA: Diagnosis not present

## 2017-08-29 DIAGNOSIS — R7303 Prediabetes: Secondary | ICD-10-CM | POA: Diagnosis not present

## 2017-08-29 DIAGNOSIS — Z955 Presence of coronary angioplasty implant and graft: Secondary | ICD-10-CM | POA: Insufficient documentation

## 2017-08-29 DIAGNOSIS — F419 Anxiety disorder, unspecified: Secondary | ICD-10-CM | POA: Diagnosis not present

## 2017-08-29 DIAGNOSIS — I480 Paroxysmal atrial fibrillation: Secondary | ICD-10-CM | POA: Insufficient documentation

## 2017-08-29 MED ORDER — FUROSEMIDE 20 MG PO TABS: 20.0000 mg | ORAL_TABLET | ORAL | 6 refills | Status: DC

## 2017-08-29 NOTE — Progress Notes (Signed)
PCP: Primary HF Cardiologist: Dr Sharlotte Alamo  HPI: [name] a 83 y.o.femalewith h/o HTN, osteoporosis, L breast cancer s/p XRT and chemo and definitive surgery, HLD, and pre diabetes.   Admitted to [facility]  ED 07/27/17 with CP. Hospital course was complicated by acute respiratory failure, bradycardic arrest, Afib RVR, and cardiogenic shock in the setting of anterior MI.  Found to have acute anterior MI and taken urgently to the cath lab. [place] 07/27/17 as below with 3v CAD, LVEF 35-40%, elevated LVEDP. Underwent PCI with successful stenting of the ramus intermediate artery with DES x 2. Had bradycardic arrest on arrival to ICU with epi/atropine and brief CPR and intubation. As she improved she was extubated on 2/24. An ECHO was completed an showed LVEF 40-45%.    Diuresed with IV lasix and once euvolemic transitioned to po lasix.  HF medications optimized. Renal function was followed closely. She had several episodes of bradycardia so beta blocker was stopped. Later developed A fib RVR so amiodarone was loaded and she was started on eliquis. Chemically converted to NSR but had some break through A fib. Plan to continue amio 400 mg twice a day for 7 days then cut back to 200 mg twice a day. Continue eliquis 2.5 mg twice a day ( reduced dose for age and creatinine). Discharge 167 pounds.   Today she returns for HF follow up. Last visit amio was cut back to 200 mg daily and lasix was cut back to every other day. Overall feeling much better. Denies SOB/PND/Orthopnea. Continues to ambulate with a walker. No chest pain. Appetite remains low.  No fever or chills. Weight at home trending down. Taking all medications. HH continues to follow.    ECHO 07/27/2017 EF 40-45%   LHC 07/27/2017   Post Atrio lesion is 90% stenosed.  Prox LAD to Dist LAD lesion is 50% stenosed.  Ost 1st Diag lesion is 95% stenosed.  Ost 2nd Diag to 2nd Diag lesion is 90% stenosed.  Mid Cx lesion is 30% stenosed.  Ost Ramus  to Ramus lesion is 100% stenosed.  A drug-eluting stent was successfully placed using a STENT SYNERGY DES 2.75X28.  A drug-eluting stent was successfully placed using a STENT SYNERGY DES 2.5X12.  Post intervention, there is a 0% residual stenosis.  There is moderate to severe left ventricular systolic dysfunction.  LV end diastolic pressure is moderately elevated.  The left ventricular ejection fraction is 35-45% by visual estimate. 1. 3 vessel obstructive CAD - 100% ramus intermediate artery. This is a large vessel and is the culprit lesion. - 95% small first diagonal - 90% small second diagonal  - diffusely diseased LAD - 90% PLOM 2. Moderate to severe LV dysfunction. EF estimated at 35-40%. 3. Elevated LVEDP 4. Successful stenting of the ramus intermediate artery with DES x 2.  Plan; DAPT for one year   ROS: All systems negative except as listed in HPI, PMH and Problem List.  SH:  Social History   Socioeconomic History  . Marital status: Married    Spouse name: Not on file  . Number of children: Not on file  . Years of education: Not on file  . Highest education level: Not on file  Occupational History  . Occupation: retired  Scientific laboratory technician  . Financial resource strain: Not on file  . Food insecurity:    Worry: Not on file    Inability: Not on file  . Transportation needs:    Medical: Not on file  Non-medical: Not on file  Tobacco Use  . Smoking status: Never Smoker  . Smokeless tobacco: Never Used  Substance and Sexual Activity  . Alcohol use: No  . Drug use: No  . Sexual activity: Not on file  Lifestyle  . Physical activity:    Days per week: Not on file    Minutes per session: Not on file  . Stress: Not on file  Relationships  . Social connections:    Talks on phone: Not on file    Gets together: Not on file    Attends religious service: Not on file    Active member of club or organization: Not on file    Attends meetings of clubs  or organizations: Not on file    Relationship status: Not on file  . Intimate partner violence:    Fear of current or ex partner: Not on file    Emotionally abused: Not on file    Physically abused: Not on file    Forced sexual activity: Not on file  Other Topics Concern  . Not on file  Social History Narrative  . Not on file    FH:  Family History  Problem Relation Age of Onset  . Pancreatic cancer Mother        pancreastic  . Stroke Father   . Hypertension Brother   . Hypertension Sister   . Hypertension Brother   . Heart disease Brother   . Hypertension Sister     Past Medical History:  Diagnosis Date  . Anxiety   . Breast cancer ([place])   . Breast cancer of lower-outer quadrant of left female breast ([place]) 10/11/2014  . GERD (gastroesophageal reflux disease)   . Hypertension   . Personal history of radiation therapy    2016  . PONV (postoperative nausea and vomiting)   . PUD (peptic ulcer disease)   . Stone, kidney     Current Outpatient Medications  Medication Sig Dispense Refill  . amiodarone (PACERONE) 200 MG tablet Take 1 tablet (200 mg total) by mouth daily. 90 tablet 3  . anastrozole (ARIMIDEX) 1 MG tablet TAKE ONE TABLET BY MOUTH ONCE DAILY 90 tablet 4  . apixaban (ELIQUIS) 2.5 MG TABS tablet Take 1 tablet (2.5 mg total) by mouth 2 (two) times daily. 60 tablet 6  . aspirin 81 MG tablet Take 81 mg by mouth 3 (three) times a week.     Marland Kitchen atorvastatin (LIPITOR) 80 MG tablet Take 1 tablet (80 mg total) by mouth daily at 6 PM. 30 tablet 6  . Calcium Carb-Cholecalciferol (CALCIUM + D3) 600-800 MG-UNIT TABS Take by mouth.    . cholecalciferol (VITAMIN D) 1000 UNITS tablet Take 1,000 Units by mouth daily. Over the counter    . citalopram (CELEXA) 10 MG tablet Take 1 tablet (10 mg total) by mouth daily. 30 tablet 6  . clopidogrel (PLAVIX) 75 MG tablet Take 1 tablet (75 mg total) by mouth daily. 30 tablet 6  . co-enzyme Q-10 30 MG capsule Take 30 mg by mouth 3 (three)  times daily.    . fish oil-omega-3 fatty acids 1000 MG capsule Take 1 g by mouth daily.    . furosemide (LASIX) 20 MG tablet Take 1 tablet (20 mg total) by mouth every other day. 15 tablet 6  . losartan (COZAAR) 25 MG tablet Take 1 tablet (25 mg total) by mouth 2 (two) times daily. 60 tablet 6  . magnesium gluconate (MAGONATE) 500 MG tablet Take 500 mg  by mouth 2 (two) times daily.    . Multiple Vitamin (MULTIVITAMIN) tablet Take 1 tablet by mouth daily.    . nitroGLYCERIN (NITROSTAT) 0.3 MG SL tablet Place 1 tablet (0.3 mg total) under the tongue every 5 (five) minutes as needed for chest pain. 90 tablet 12  . spironolactone (ALDACTONE) 25 MG tablet Take 1 tablet (25 mg total) by mouth daily. 30 tablet 6   No current facility-administered medications for this encounter.     Vitals:   08/29/17 1040  BP: 128/60  Pulse: 62  SpO2: 100%  Weight: 163 lb 12.8 oz (74.3 kg)    PHYSICAL EXAM: General:  Well appearing. No resp difficulty. Walked in the clinic with a walker. Son present.  HEENT: normal Neck: supple. no JVD. Carotids 2+ bilat; no bruits. No lymphadenopathy or thryomegaly appreciated. Cor: PMI nondisplaced. Regular rate & rhythm. No rubs, gallops or murmurs. Lungs: clear Abdomen: soft, nontender, nondistended. No hepatosplenomegaly. No bruits or masses. Good bowel sounds. Extremities: no cyanosis, clubbing, rash, edema Neuro: alert & orientedx3, cranial nerves grossly intact. moves all 4 extremities w/o difficulty. Affect pleasant  ASSESSMENT & PLAN: 1. CAD No s/s ischemia.   3V CAD with STEMI 07/2017 S/P DES x2  Continue aspirin, plavix, eliquis, and high dose statin. DAPT x1 year  2. Chronic Systolic Heart Failure, ICM- ECHO EF 07/2017 EF 40-45% NHYA II.  Volume status low. Cut back lasix to 20 mg twice a week.  No bb for now with recent cardiogenic shock Continue losartan 25 mg twice a day.  Continue 25 mg spironolactone daily  BMET next visit.   3. PAF -Regular  pulse. Continue amio 200 mg daily.  -Continue eliqis 2.5 mg twice a day  4. H.O Left Breast Cancer.    Follow up in 3 weeks with Dr Haroldine Laws.   Amy Clegg NP-C  10:41 AM

## 2017-08-29 NOTE — Patient Instructions (Signed)
DECREASE Lasix to twice weekly.  Take every Monday and Thursday only.  Follow up in 3 weeks with Dr. Haroldine Laws

## 2017-09-04 ENCOUNTER — Telehealth: Payer: Self-pay | Admitting: Cardiology

## 2017-09-04 ENCOUNTER — Emergency Department (HOSPITAL_COMMUNITY)
Admission: EM | Admit: 2017-09-04 | Discharge: 2017-09-04 | Disposition: A | Discharge status: Home / Self Care | Payer: Medicare Other | Attending: Emergency Medicine | Admitting: Emergency Medicine

## 2017-09-04 ENCOUNTER — Encounter (HOSPITAL_COMMUNITY): Payer: Self-pay | Admitting: Emergency Medicine

## 2017-09-04 DIAGNOSIS — Z95818 Presence of other cardiac implants and grafts: Secondary | ICD-10-CM | POA: Diagnosis not present

## 2017-09-04 DIAGNOSIS — Z8674 Personal history of sudden cardiac arrest: Secondary | ICD-10-CM | POA: Diagnosis not present

## 2017-09-04 DIAGNOSIS — Z79899 Other long term (current) drug therapy: Secondary | ICD-10-CM | POA: Diagnosis not present

## 2017-09-04 DIAGNOSIS — M810 Age-related osteoporosis without current pathological fracture: Secondary | ICD-10-CM | POA: Diagnosis not present

## 2017-09-04 DIAGNOSIS — Z7902 Long term (current) use of antithrombotics/antiplatelets: Secondary | ICD-10-CM | POA: Insufficient documentation

## 2017-09-04 DIAGNOSIS — R7303 Prediabetes: Secondary | ICD-10-CM | POA: Diagnosis not present

## 2017-09-04 DIAGNOSIS — E785 Hyperlipidemia, unspecified: Secondary | ICD-10-CM | POA: Diagnosis not present

## 2017-09-04 DIAGNOSIS — I480 Paroxysmal atrial fibrillation: Secondary | ICD-10-CM | POA: Diagnosis not present

## 2017-09-04 DIAGNOSIS — I2510 Atherosclerotic heart disease of native coronary artery without angina pectoris: Secondary | ICD-10-CM | POA: Diagnosis not present

## 2017-09-04 DIAGNOSIS — I4891 Unspecified atrial fibrillation: Secondary | ICD-10-CM | POA: Diagnosis not present

## 2017-09-04 DIAGNOSIS — I10 Essential (primary) hypertension: Secondary | ICD-10-CM | POA: Insufficient documentation

## 2017-09-04 DIAGNOSIS — Z7901 Long term (current) use of anticoagulants: Secondary | ICD-10-CM | POA: Insufficient documentation

## 2017-09-04 DIAGNOSIS — I2129 ST elevation (STEMI) myocardial infarction involving other sites: Secondary | ICD-10-CM | POA: Diagnosis not present

## 2017-09-04 DIAGNOSIS — R319 Hematuria, unspecified: Secondary | ICD-10-CM | POA: Diagnosis present

## 2017-09-04 DIAGNOSIS — I5021 Acute systolic (congestive) heart failure: Secondary | ICD-10-CM | POA: Diagnosis not present

## 2017-09-04 DIAGNOSIS — I110 Hypertensive heart disease with heart failure: Secondary | ICD-10-CM | POA: Diagnosis not present

## 2017-09-04 DIAGNOSIS — Z923 Personal history of irradiation: Secondary | ICD-10-CM | POA: Diagnosis not present

## 2017-09-04 DIAGNOSIS — Z853 Personal history of malignant neoplasm of breast: Secondary | ICD-10-CM | POA: Diagnosis not present

## 2017-09-04 DIAGNOSIS — R310 Gross hematuria: Secondary | ICD-10-CM | POA: Diagnosis not present

## 2017-09-04 DIAGNOSIS — N390 Urinary tract infection, site not specified: Secondary | ICD-10-CM | POA: Diagnosis not present

## 2017-09-04 DIAGNOSIS — Z7982 Long term (current) use of aspirin: Secondary | ICD-10-CM | POA: Diagnosis not present

## 2017-09-04 DIAGNOSIS — I255 Ischemic cardiomyopathy: Secondary | ICD-10-CM | POA: Diagnosis not present

## 2017-09-04 LAB — CBC WITH DIFFERENTIAL/PLATELET
BASOS ABS: 0 10*3/uL (ref 0.0–0.1)
BASOS PCT: 1 %
EOS PCT: 3 %
Eosinophils Absolute: 0.2 10*3/uL (ref 0.0–0.7)
HCT: 30 % — ABNORMAL LOW (ref 36.0–46.0)
Hemoglobin: 9.5 g/dL — ABNORMAL LOW (ref 12.0–15.0)
Lymphocytes Relative: 18 %
Lymphs Abs: 0.9 10*3/uL (ref 0.7–4.0)
MCH: 29 pg (ref 26.0–34.0)
MCHC: 31.7 g/dL (ref 30.0–36.0)
MCV: 91.5 fL (ref 78.0–100.0)
MONO ABS: 0.4 10*3/uL (ref 0.1–1.0)
Monocytes Relative: 8 %
Neutro Abs: 3.5 10*3/uL (ref 1.7–7.7)
Neutrophils Relative %: 70 %
PLATELETS: 230 10*3/uL (ref 150–400)
RBC: 3.28 MIL/uL — ABNORMAL LOW (ref 3.87–5.11)
RDW: 14.1 % (ref 11.5–15.5)
WBC: 5.1 10*3/uL (ref 4.0–10.5)

## 2017-09-04 LAB — URINALYSIS, ROUTINE W REFLEX MICROSCOPIC
BILIRUBIN URINE: NEGATIVE
GLUCOSE, UA: NEGATIVE mg/dL
KETONES UR: NEGATIVE mg/dL
NITRITE: NEGATIVE
PROTEIN: 100 mg/dL — AB
Specific Gravity, Urine: 1.009 (ref 1.005–1.030)
pH: 6 (ref 5.0–8.0)

## 2017-09-04 LAB — BASIC METABOLIC PANEL
ANION GAP: 10 (ref 5–15)
BUN: 17 mg/dL (ref 6–20)
CALCIUM: 10.8 mg/dL — AB (ref 8.9–10.3)
CO2: 21 mmol/L — ABNORMAL LOW (ref 22–32)
Chloride: 104 mmol/L (ref 101–111)
Creatinine, Ser: 1.58 mg/dL — ABNORMAL HIGH (ref 0.44–1.00)
GFR, EST AFRICAN AMERICAN: 35 mL/min — AB (ref >60)
GFR, EST NON AFRICAN AMERICAN: 30 mL/min — AB (ref >60)
GLUCOSE: 94 mg/dL (ref 65–99)
Potassium: 4.5 mmol/L (ref 3.5–5.1)
SODIUM: 135 mmol/L (ref 135–145)

## 2017-09-04 NOTE — Discharge Instructions (Addendum)
RETURN TO ER IF YOU HAVE ANY SEVERE PAIN, FEVER, INABILITY TO URINATE, OR SIGNIFICANT BLEEDING FROM YOUR URINE.

## 2017-09-04 NOTE — ED Triage Notes (Signed)
Patient to ED c/o new blood in urine x 2 days. Pt states she recently started 2 new blood thinners (Plavix and Eliquis). Pt denies burning sensation with urination, no fevers or chills, but does endorse frequency at night. Denies pain.

## 2017-09-04 NOTE — Telephone Encounter (Signed)
Pt called reporting she has been having hematuria for the past 3 days. Today seems to be worse and urine is very dark in color. In reviewing notes, she is on plavix and eliquis. I informed the patient that she needs to be seen this evening for this as it's very concerning given she is on 2 blood thinners. She understood and was going to be seen. Thanked me for the follow up call.

## 2017-09-04 NOTE — ED Provider Notes (Signed)
Patient placed in Quick Look pathway, seen and evaluated   Chief Complaint: hematuria  HPI:   Pt presenting with dark urine that began on Monday. Denies dysuria, fever, abd pain, inc frequency, flank pain. Pt taking plavix and eliquis since being discharge from hospital last month after MI.   ROS: - dysuria, - frequency, + dark urine  Physical Exam:   Gen: No distress  Neuro: Awake and Alert  Skin: Warm    Focused Exam: abd soft and nontender.    Initiation of care has begun. The patient has been counseled on the process, plan, and necessity for staying for the completion/evaluation, and the remainder of the medical screening examination    Robinson, Martinique N, PA-C 09/04/17 1837    Little, Wenda Overland, MD 09/05/17 [phone]

## 2017-09-05 NOTE — ED Provider Notes (Signed)
[place] EMERGENCY DEPARTMENT Provider Note   CSN: [id number] Arrival date & time: 09/04/17  1811     History   Chief Complaint Chief Complaint  Patient presents with  . Hematuria    HPI [name] is a 83 y.o. female.  83 year old female with past medical history including hypertension, distant history of breast cancer, atrial fibrillation on Eliquis who presents with hematuria.  Patient states that she has had 2 days of painless hematuria.  She denies any associated abdominal pain, burning with urination, difficulty urinating, flank pain, fevers, nausea/vomiting, or other complaints.  She urinates more at night but states that she does not have any difficulty emptying her bladder.  She has been eating and drinking normally.  She notes that she was started on Plavix and Eliquis after hospitalization on 3/7.  She denies any history of bladder problems.  No vaginal bleeding.  The history is provided by the patient.  Hematuria     Past Medical History:  Diagnosis Date  . Anxiety   . Breast cancer ([place])   . Breast cancer of lower-outer quadrant of left female breast ([place]) 10/11/2014  . GERD (gastroesophageal reflux disease)   . Hypertension   . Personal history of radiation therapy    2016  . PONV (postoperative nausea and vomiting)   . PUD (peptic ulcer disease)   . Stone, kidney     Patient Active Problem List   Diagnosis Date Noted  . Acute systolic heart failure ([place])   . Respiratory failure ([place])   . A-fib ([place])   . Pressure injury of skin 07/28/2017  . Acute ST elevation myocardial infarction (STEMI) of anterolateral wall ([place]) 07/27/2017  . Heart attack ([place]) 07/27/2017  . Osteoporosis 10/20/2014  . Malignant neoplasm of lower-outer quadrant of left breast of female, estrogen receptor positive ([place]) 10/11/2014  . Pure hypercholesterolemia 04/08/2014  . Anxiety   . PUD (peptic ulcer disease)   . HTN (hypertension), benign 06/29/2011  .  GERD (gastroesophageal reflux disease) 06/29/2011    Past Surgical History:  Procedure Laterality Date  . BREAST LUMPECTOMY WITH SENTINEL LYMPH NODE BIOPSY Left 10/22/2014   Procedure: BREAST LUMPECTOMY WITH SENTINEL LYMPH NODE BIOPSY;  Surgeon: Autumn Messing III, MD;  Location: [facility];  Service: General;  Laterality: Left;  . CORONARY STENT INTERVENTION N/A 07/27/2017   Procedure: CORONARY STENT INTERVENTION;  Surgeon: Martinique, Peter M, MD;  Location: [facility];  Service: Cardiovascular;  Laterality: N/A;  . CORONARY/GRAFT ACUTE MI REVASCULARIZATION N/A 07/27/2017   Procedure: Coronary/Graft Acute MI Revascularization;  Surgeon: Martinique, Peter M, MD;  Location: [facility];  Service: Cardiovascular;  Laterality: N/A;  . LEFT HEART CATH AND CORONARY ANGIOGRAPHY N/A 07/27/2017   Procedure: LEFT HEART CATH AND CORONARY ANGIOGRAPHY;  Surgeon: Martinique, Peter M, MD;  Location: [facility];  Service: Cardiovascular;  Laterality: N/A;  . RE-EXCISION OF BREAST CANCER,SUPERIOR MARGINS Left 11/04/2014   Procedure: RE-EXCISION OF LEFT BREAST INFERIOR MARGINS;  Surgeon: Autumn Messing III, MD;  Location: [facility];  Service: General;  Laterality: Left;     OB History    Gravida  5   Para  4   Term      Preterm      AB      Living        SAB      TAB      Ectopic      Multiple  Live Births           Obstetric Comments  Menses age 60, G71, P4,BC x 2-3 years, Menopause age 47 or 83 years old, HRT~ 3years             Home Medications    Prior to Admission medications   Medication Sig Start Date End Date Taking? Authorizing Provider  amiodarone (PACERONE) 200 MG tablet Take 1 tablet (200 mg total) by mouth daily. 08/15/17  Yes Clegg, Amy D, NP  anastrozole (ARIMIDEX) 1 MG tablet TAKE ONE TABLET BY MOUTH ONCE DAILY 04/05/17  Yes Magrinat, Virgie Dad, MD  apixaban (ELIQUIS) 2.5 MG TABS tablet Take 1 tablet (2.5 mg total) by mouth 2 (two) times daily. 08/07/17   Yes Clegg, Amy D, NP  atorvastatin (LIPITOR) 80 MG tablet Take 1 tablet (80 mg total) by mouth daily at 6 PM. 08/07/17  Yes Clegg, Amy D, NP  Calcium Carb-Cholecalciferol (CALCIUM + D3) 600-800 MG-UNIT TABS Take 1 tablet by mouth daily.    Yes [provider]  cholecalciferol (VITAMIN D) 1000 UNITS tablet Take 1,000 Units by mouth daily. Over the counter   Yes [provider]  citalopram (CELEXA) 10 MG tablet Take 1 tablet (10 mg total) by mouth daily. 08/08/17  Yes Clegg, Amy D, NP  clopidogrel (PLAVIX) 75 MG tablet Take 1 tablet (75 mg total) by mouth daily. 08/08/17  Yes Clegg, Amy D, NP  co-enzyme Q-10 30 MG capsule Take 30 mg by mouth 3 (three) times daily.   Yes [provider]  fish oil-omega-3 fatty acids 1000 MG capsule Take 1 g by mouth daily.   Yes [provider]  furosemide (LASIX) 20 MG tablet Take 1 tablet (20 mg total) by mouth 2 (two) times a week. Take every Monday and Thursday. 08/29/17  Yes Clegg, Amy D, NP  losartan (COZAAR) 25 MG tablet Take 1 tablet (25 mg total) by mouth 2 (two) times daily. 08/07/17  Yes Clegg, Amy D, NP  magnesium gluconate (MAGONATE) 500 MG tablet Take 500 mg by mouth 2 (two) times daily.   Yes [provider]  Multiple Vitamin (MULTIVITAMIN) tablet Take 1 tablet by mouth daily.   Yes [provider]  nitroGLYCERIN (NITROSTAT) 0.3 MG SL tablet Place 1 tablet (0.3 mg total) under the tongue every 5 (five) minutes as needed for chest pain. 08/07/17  Yes Clegg, Amy D, NP  spironolactone (ALDACTONE) 25 MG tablet Take 1 tablet (25 mg total) by mouth daily. 08/08/17  Yes Clegg, Amy D, NP    Family History Family History  Problem Relation Age of Onset  . Pancreatic cancer Mother        pancreastic  . Stroke Father   . Hypertension Brother   . Hypertension Sister   . Hypertension Brother   . Heart disease Brother   . Hypertension Sister     Social History Social History   Tobacco Use  . Smoking status: Never  Smoker  . Smokeless tobacco: Never Used  Substance Use Topics  . Alcohol use: No  . Drug use: No     Allergies   Lisinopril   Review of Systems Review of Systems  Genitourinary: Positive for hematuria.   All other systems reviewed and are negative except that which was mentioned in HPI   Physical Exam Updated Vital Signs BP (!) 149/69   Pulse 61   Temp 98.1 F (36.7 C) (Oral)   Resp 17   SpO2 99%  Physical Exam  Constitutional: She is oriented to person, place, and time. She appears well-developed and well-nourished. No distress.  HENT:  Head: Normocephalic and atraumatic.  Mouth/Throat: Oropharynx is clear and moist.  Moist mucous membranes  Eyes: Conjunctivae are normal.  Neck: Neck supple.  Cardiovascular: Normal rate. An irregularly irregular rhythm present.  Murmur heard. Pulmonary/Chest: Effort normal and breath sounds normal.  Abdominal: Soft. Bowel sounds are normal. She exhibits no distension. There is no tenderness.  Musculoskeletal: She exhibits no edema.  Neurological: She is alert and oriented to person, place, and time.  Fluent speech  Skin: Skin is warm and dry.  Psychiatric: She has a normal mood and affect. Judgment normal.  Nursing note and vitals reviewed.    ED Treatments / Results  Labs (all labs ordered are listed, but only abnormal results are displayed) Labs Reviewed  URINALYSIS, ROUTINE W REFLEX MICROSCOPIC - Abnormal; Notable for the following components:      Result Value   Color, Urine AMBER (*)    APPearance CLOUDY (*)    Hgb urine dipstick LARGE (*)    Protein, ur 100 (*)    Leukocytes, UA TRACE (*)    Bacteria, UA RARE (*)    Squamous Epithelial / LPF 0-5 (*)    All other components within normal limits  CBC WITH DIFFERENTIAL/PLATELET - Abnormal; Notable for the following components:   RBC 3.28 (*)    Hemoglobin 9.5 (*)    HCT 30.0 (*)    All other components within normal limits  BASIC METABOLIC PANEL - Abnormal;  Notable for the following components:   CO2 21 (*)    Creatinine, Ser 1.58 (*)    Calcium 10.8 (*)    GFR calc non Af Amer 30 (*)    GFR calc Af Amer 35 (*)    All other components within normal limits  URINE CULTURE    EKG None  Radiology No results found.  Procedures Procedures (including critical care time)  Medications Ordered in ED Medications - No data to display   Initial Impression / Assessment and Plan / ED Course  I have reviewed the triage vital signs and the nursing notes.  Pertinent labs that were available during my care of the patient were reviewed by me and considered in my medical decision making (see chart for details).     Pt well appearing and comfortable on exam.  No complaints, afebrile, abdomen soft and nontender.  Voiding spontaneously without difficulty.  UA shows blood without obvious signs of infection.  Creatinine similar to previous, CBC reassuring.  She is having no evidence of obstruction from blood clots and no evidence of acute blood loss from her hematuria.  I have recommended follow-up with [facility] and extensively reviewed return precautions with her.  She voiced understanding.  Final Clinical Impressions(s) / ED Diagnoses   Final diagnoses:  Gross hematuria    ED Discharge Orders    None       Emery Dupuy, Wenda Overland, MD 09/05/17 [phone]

## 2017-09-06 LAB — URINE CULTURE

## 2017-09-11 DIAGNOSIS — I110 Hypertensive heart disease with heart failure: Secondary | ICD-10-CM | POA: Diagnosis not present

## 2017-09-11 DIAGNOSIS — R7303 Prediabetes: Secondary | ICD-10-CM | POA: Diagnosis not present

## 2017-09-11 DIAGNOSIS — I255 Ischemic cardiomyopathy: Secondary | ICD-10-CM | POA: Diagnosis not present

## 2017-09-11 DIAGNOSIS — Z853 Personal history of malignant neoplasm of breast: Secondary | ICD-10-CM | POA: Diagnosis not present

## 2017-09-11 DIAGNOSIS — I2129 ST elevation (STEMI) myocardial infarction involving other sites: Secondary | ICD-10-CM | POA: Diagnosis not present

## 2017-09-11 DIAGNOSIS — I5021 Acute systolic (congestive) heart failure: Secondary | ICD-10-CM | POA: Diagnosis not present

## 2017-09-11 DIAGNOSIS — Z7982 Long term (current) use of aspirin: Secondary | ICD-10-CM | POA: Diagnosis not present

## 2017-09-11 DIAGNOSIS — I2510 Atherosclerotic heart disease of native coronary artery without angina pectoris: Secondary | ICD-10-CM | POA: Diagnosis not present

## 2017-09-11 DIAGNOSIS — N390 Urinary tract infection, site not specified: Secondary | ICD-10-CM | POA: Diagnosis not present

## 2017-09-11 DIAGNOSIS — I480 Paroxysmal atrial fibrillation: Secondary | ICD-10-CM | POA: Diagnosis not present

## 2017-09-11 DIAGNOSIS — Z923 Personal history of irradiation: Secondary | ICD-10-CM | POA: Diagnosis not present

## 2017-09-11 DIAGNOSIS — M810 Age-related osteoporosis without current pathological fracture: Secondary | ICD-10-CM | POA: Diagnosis not present

## 2017-09-11 DIAGNOSIS — Z8674 Personal history of sudden cardiac arrest: Secondary | ICD-10-CM | POA: Diagnosis not present

## 2017-09-11 DIAGNOSIS — Z95818 Presence of other cardiac implants and grafts: Secondary | ICD-10-CM | POA: Diagnosis not present

## 2017-09-11 DIAGNOSIS — E785 Hyperlipidemia, unspecified: Secondary | ICD-10-CM | POA: Diagnosis not present

## 2017-09-19 ENCOUNTER — Other Ambulatory Visit: Payer: Self-pay

## 2017-09-19 ENCOUNTER — Encounter (HOSPITAL_COMMUNITY): Payer: Self-pay | Admitting: Internal Medicine

## 2017-09-19 ENCOUNTER — Ambulatory Visit (HOSPITAL_COMMUNITY)
Admission: RE | Admit: 2017-09-19 | Discharge: 2017-09-19 | Disposition: A | Discharge status: Home / Self Care | Payer: Medicare Other | Source: Ambulatory Visit | Attending: Internal Medicine | Admitting: Internal Medicine

## 2017-09-19 VITALS — BP 151/61 | HR 65 | Wt 160.1 lb

## 2017-09-19 DIAGNOSIS — Z8711 Personal history of peptic ulcer disease: Secondary | ICD-10-CM | POA: Diagnosis not present

## 2017-09-19 DIAGNOSIS — I252 Old myocardial infarction: Secondary | ICD-10-CM | POA: Diagnosis not present

## 2017-09-19 DIAGNOSIS — Z800 Family history of malignant neoplasm of digestive organs: Secondary | ICD-10-CM | POA: Diagnosis not present

## 2017-09-19 DIAGNOSIS — I480 Paroxysmal atrial fibrillation: Secondary | ICD-10-CM | POA: Insufficient documentation

## 2017-09-19 DIAGNOSIS — Z9221 Personal history of antineoplastic chemotherapy: Secondary | ICD-10-CM | POA: Insufficient documentation

## 2017-09-19 DIAGNOSIS — I130 Hypertensive heart and chronic kidney disease with heart failure and stage 1 through stage 4 chronic kidney disease, or unspecified chronic kidney disease: Secondary | ICD-10-CM | POA: Diagnosis not present

## 2017-09-19 DIAGNOSIS — Z955 Presence of coronary angioplasty implant and graft: Secondary | ICD-10-CM | POA: Diagnosis not present

## 2017-09-19 DIAGNOSIS — E785 Hyperlipidemia, unspecified: Secondary | ICD-10-CM | POA: Insufficient documentation

## 2017-09-19 DIAGNOSIS — I5022 Chronic systolic (congestive) heart failure: Secondary | ICD-10-CM

## 2017-09-19 DIAGNOSIS — M810 Age-related osteoporosis without current pathological fracture: Secondary | ICD-10-CM | POA: Diagnosis not present

## 2017-09-19 DIAGNOSIS — Z8249 Family history of ischemic heart disease and other diseases of the circulatory system: Secondary | ICD-10-CM | POA: Diagnosis not present

## 2017-09-19 DIAGNOSIS — Z87442 Personal history of urinary calculi: Secondary | ICD-10-CM | POA: Diagnosis not present

## 2017-09-19 DIAGNOSIS — K219 Gastro-esophageal reflux disease without esophagitis: Secondary | ICD-10-CM | POA: Diagnosis not present

## 2017-09-19 DIAGNOSIS — Z79899 Other long term (current) drug therapy: Secondary | ICD-10-CM | POA: Diagnosis not present

## 2017-09-19 DIAGNOSIS — R7303 Prediabetes: Secondary | ICD-10-CM | POA: Insufficient documentation

## 2017-09-19 DIAGNOSIS — Z823 Family history of stroke: Secondary | ICD-10-CM | POA: Insufficient documentation

## 2017-09-19 DIAGNOSIS — Z7902 Long term (current) use of antithrombotics/antiplatelets: Secondary | ICD-10-CM | POA: Insufficient documentation

## 2017-09-19 DIAGNOSIS — N183 Chronic kidney disease, stage 3 unspecified: Secondary | ICD-10-CM

## 2017-09-19 DIAGNOSIS — Z853 Personal history of malignant neoplasm of breast: Secondary | ICD-10-CM | POA: Diagnosis not present

## 2017-09-19 DIAGNOSIS — Z7901 Long term (current) use of anticoagulants: Secondary | ICD-10-CM | POA: Insufficient documentation

## 2017-09-19 DIAGNOSIS — Z923 Personal history of irradiation: Secondary | ICD-10-CM | POA: Insufficient documentation

## 2017-09-19 DIAGNOSIS — Z79811 Long term (current) use of aromatase inhibitors: Secondary | ICD-10-CM | POA: Insufficient documentation

## 2017-09-19 DIAGNOSIS — I2510 Atherosclerotic heart disease of native coronary artery without angina pectoris: Secondary | ICD-10-CM

## 2017-09-19 DIAGNOSIS — F419 Anxiety disorder, unspecified: Secondary | ICD-10-CM | POA: Insufficient documentation

## 2017-09-19 MED ORDER — CARVEDILOL 3.125 MG PO TABS: 3.1250 mg | ORAL_TABLET | Freq: Two times a day (BID) | ORAL | 3 refills | Status: DC

## 2017-09-19 NOTE — Addendum Note (Signed)
Encounter addended by: Scarlette Calico, RN on: 09/19/2017 10:26 AM  Actions taken: Order list changed, Diagnosis association updated

## 2017-09-19 NOTE — Patient Instructions (Signed)
Start Carvedilol 3.125 mg Twice daily   You have been referred to Cardiac Rehab, they will call you to schedule  Your physician recommends that you schedule a follow-up appointment in: 2-3 months with echocardiogram

## 2017-09-19 NOTE — Addendum Note (Signed)
Encounter addended by: Scarlette Calico, RN on: 09/19/2017 10:22 AM  Actions taken: Pharmacy for encounter modified, Order list changed

## 2017-09-19 NOTE — Progress Notes (Signed)
ADVANCED HF CLINIC NOTE  PCP: Primary HF Cardiologist: Dr Sharlotte Alamo  HPI: SHARNESE HEATH a 83 y.o.femalewith h/o HTN, osteoporosis, L breast cancer s/p XRT and chemo and definitive surgery, HLD, and pre diabetes.   Admitted to [facility]  ED 07/27/17 with CP. Hospital course was complicated by acute respiratory failure, bradycardic arrest, Afib RVR, and cardiogenic shock in the setting of anterior MI.  Found to have acute anterior MI and taken urgently to the cath lab. [place] 07/27/17 as below with 3v CAD, LVEF 35-40%, elevated LVEDP. Underwent PCI with successful stenting of the ramus intermediate artery with DES x 2. Had bradycardic arrest on arrival to ICU with epi/atropine and brief CPR and intubation. As she improved she was extubated on 2/24. An ECHO was completed an showed LVEF 40-45%.     Diuresed with IV lasix and once euvolemic transitioned to po lasix.  HF medications optimized. Renal function was followed closely. She had several episodes of bradycardia so beta blocker was stopped. Later developed A fib RVR so amiodarone was loaded and she was started on eliquis. Chemically converted to NSR but had some break through A fib. Plan to continue amio 400 mg twice a day for 7 days then cut back to 200 mg twice a day. Continue eliquis 2.5 mg twice a day ( reduced dose for age and creatinine). Discharge 167 pounds.   Today she returns for HF follow up. At the last visit lasix cut back lasix to 20 mg Mon and Thur. Not taking extra lasix  Weight stable 159-160. Gets rare dizziness when she puts her head down at night. Lasts just a few seconds. No orthostasis. SBP 120s. Walking around the house. No CP or SOB. Has finished home PT. No CP or SOB. No palpitations. Off ASA. No bleeding on Plavix and Eliquis    ECHO 07/27/2017 EF 40-45%   LHC 07/27/2017   Post Atrio lesion is 90% stenosed.  Prox LAD to Dist LAD lesion is 50% stenosed.  Ost 1st Diag lesion is 95% stenosed.  Ost 2nd Diag to 2nd Diag  lesion is 90% stenosed.  Mid Cx lesion is 30% stenosed.  Ost Ramus to Ramus lesion is 100% stenosed.  A drug-eluting stent was successfully placed using a STENT SYNERGY DES 2.75X28.  A drug-eluting stent was successfully placed using a STENT SYNERGY DES 2.5X12.  Post intervention, there is a 0% residual stenosis.  There is moderate to severe left ventricular systolic dysfunction.  LV end diastolic pressure is moderately elevated.  The left ventricular ejection fraction is 35-45% by visual estimate. 1. 3 vessel obstructive CAD - 100% ramus intermediate artery. This is a large vessel and is the culprit lesion. - 95% small first diagonal - 90% small second diagonal  - diffusely diseased LAD - 90% PLOM 2. Moderate to severe LV dysfunction. EF estimated at 35-40%. 3. Elevated LVEDP 4. Successful stenting of the ramus intermediate artery with DES x 2.  Plan; DAPT for one year   ROS: All systems negative except as listed in HPI, PMH and Problem List.  SH:  Social History   Socioeconomic History  . Marital status: Married    Spouse name: Not on file  . Number of children: Not on file  . Years of education: Not on file  . Highest education level: Not on file  Occupational History  . Occupation: retired  Scientific laboratory technician  . Financial resource strain: Not on file  . Food insecurity:    Worry: Not on  file    Inability: Not on file  . Transportation needs:    Medical: Not on file    Non-medical: Not on file  Tobacco Use  . Smoking status: Never Smoker  . Smokeless tobacco: Never Used  Substance and Sexual Activity  . Alcohol use: No  . Drug use: No  . Sexual activity: Not on file  Lifestyle  . Physical activity:    Days per week: Not on file    Minutes per session: Not on file  . Stress: Not on file  Relationships  . Social connections:    Talks on phone: Not on file    Gets together: Not on file    Attends religious service: Not on file    Active  member of club or organization: Not on file    Attends meetings of clubs or organizations: Not on file    Relationship status: Not on file  . Intimate partner violence:    Fear of current or ex partner: Not on file    Emotionally abused: Not on file    Physically abused: Not on file    Forced sexual activity: Not on file  Other Topics Concern  . Not on file  Social History Narrative  . Not on file    FH:  Family History  Problem Relation Age of Onset  . Pancreatic cancer Mother        pancreastic  . Stroke Father   . Hypertension Brother   . Hypertension Sister   . Hypertension Brother   . Heart disease Brother   . Hypertension Sister     Past Medical History:  Diagnosis Date  . Anxiety   . Breast cancer ([place])   . Breast cancer of lower-outer quadrant of left female breast ([place]) 10/11/2014  . GERD (gastroesophageal reflux disease)   . Hypertension   . Personal history of radiation therapy    2016  . PONV (postoperative nausea and vomiting)   . PUD (peptic ulcer disease)   . Stone, kidney     Current Outpatient Medications  Medication Sig Dispense Refill  . amiodarone (PACERONE) 200 MG tablet Take 1 tablet (200 mg total) by mouth daily. 90 tablet 3  . anastrozole (ARIMIDEX) 1 MG tablet TAKE ONE TABLET BY MOUTH ONCE DAILY 90 tablet 4  . apixaban (ELIQUIS) 2.5 MG TABS tablet Take 1 tablet (2.5 mg total) by mouth 2 (two) times daily. 60 tablet 6  . atorvastatin (LIPITOR) 80 MG tablet Take 1 tablet (80 mg total) by mouth daily at 6 PM. 30 tablet 6  . Calcium Carb-Cholecalciferol (CALCIUM + D3) 600-800 MG-UNIT TABS Take 1 tablet by mouth daily.     . cholecalciferol (VITAMIN D) 1000 UNITS tablet Take 1,000 Units by mouth daily. Over the counter    . citalopram (CELEXA) 10 MG tablet Take 1 tablet (10 mg total) by mouth daily. 30 tablet 6  . clopidogrel (PLAVIX) 75 MG tablet Take 1 tablet (75 mg total) by mouth daily. 30 tablet 6  . co-enzyme Q-10 30 MG capsule Take 30 mg by  mouth 3 (three) times daily.    . fish oil-omega-3 fatty acids 1000 MG capsule Take 1 g by mouth daily.    . furosemide (LASIX) 20 MG tablet Take 1 tablet (20 mg total) by mouth 2 (two) times a week. Take every Monday and Thursday. 15 tablet 6  . losartan (COZAAR) 25 MG tablet Take 1 tablet (25 mg total) by mouth 2 (two) times daily.  60 tablet 6  . magnesium gluconate (MAGONATE) 500 MG tablet Take 500 mg by mouth 2 (two) times daily.    . Multiple Vitamin (MULTIVITAMIN) tablet Take 1 tablet by mouth daily.    . nitroGLYCERIN (NITROSTAT) 0.3 MG SL tablet Place 1 tablet (0.3 mg total) under the tongue every 5 (five) minutes as needed for chest pain. 90 tablet 12  . spironolactone (ALDACTONE) 25 MG tablet Take 1 tablet (25 mg total) by mouth daily. 30 tablet 6   No current facility-administered medications for this encounter.     Vitals:   09/19/17 0944  BP: (!) 151/61  Pulse: 65  SpO2: 99%  Weight: 160 lb 1.9 oz (72.6 kg)    PHYSICAL EXAM: General:  Elderly. well appearing. No resp difficulty HEENT: normal Neck: supple. no JVD. Carotids 2+ bilat; no bruits. No lymphadenopathy or thryomegaly appreciated. Cor: PMI nondisplaced. Regular rate & rhythm. No rubs, gallops or murmurs. Lungs: clear Abdomen: soft, nontender, nondistended. No hepatosplenomegaly. No bruits or masses. Good bowel sounds. Extremities: no cyanosis, clubbing, rash, edema Neuro: alert & orientedx3, cranial nerves grossly intact. moves all 4 extremities w/o difficulty. Affect pleasant  ASSESSMENT & PLAN: 1. CAD - doing well no s/s ischemia   - 3V CAD with STEMI 07/2017 S/P DES x2  - Continue plavix, eliquis, and high dose statin. Off ASA - Will start CR soon  - Add carvedilol 3.125 bid  2. Chronic Systolic Heart Failure, ICM- ECHO EF 07/2017 EF 40-45% - NHYA II.  - Volume status ok. Continue lasix 20 mg twice a week.  - Continue losartan 25 mg twice a day.  - Continue 25 mg spironolactone daily  - Add carvedilol  3.125 bid - BMET today  3. PAF - Remains in NSR on exam Continue amio 200 mg daily.  - Continue eliquis 2.5 mg twice a day  4. HTN - BP elevated here by at home SBP 120-130.   5. H.O Left Breast Cancer.   6. CKD III . Creatinine 1.5-1.7 - Recheck today   Glori Bickers MD 10:02 AM

## 2017-10-08 ENCOUNTER — Telehealth (HOSPITAL_COMMUNITY): Payer: Self-pay

## 2017-10-08 ENCOUNTER — Telehealth (HOSPITAL_COMMUNITY): Payer: Self-pay | Admitting: *Deleted

## 2017-10-08 NOTE — Telephone Encounter (Signed)
Pt left mess on triage line requesting a call back.  Returned call to pt, she states when she picked up her meds from the pharmacy they questioned her about being on Plavix and Eliquis and she wanted to make sure she was supposed to be on both.  Advised pt ok to take both, as she has CAD w/stents and a-fib, she is agreeable.

## 2017-10-08 NOTE — Telephone Encounter (Signed)
Cardiac Rehab Medication Review by a Pharmacist  Does the patient  feel that his/her medications are working for him/her?  yes  Has the patient been experiencing any side effects to the medications prescribed?  no  Does the patient measure his/her own blood pressure or blood glucose at home?  yes 100-120s/50-60s  Does the patient have any problems obtaining medications due to transportation or finances?   no   Understanding of regimen: good Understanding of indications: good Potential of compliance: good  Pharmacist comments: Patient has good understanding of medication regimens. She feels her medications are working for her, but sometimes she gets headaches. She was advised to speak to her doctor about any side effects she may be having. Patient is diligent about checking her blood pressure daily. Reports no issues obtaining meds at this time.  Blaine Hamper Yoseph Haile 10/08/2017 3:38 PM

## 2017-10-10 ENCOUNTER — Encounter (HOSPITAL_COMMUNITY)
Admission: RE | Admit: 2017-10-10 | Discharge: 2017-10-10 | Disposition: A | Discharge status: Home / Self Care | Payer: Medicare Other | Source: Ambulatory Visit | Attending: Internal Medicine | Admitting: Internal Medicine

## 2017-10-10 ENCOUNTER — Encounter (HOSPITAL_COMMUNITY): Payer: Self-pay

## 2017-10-10 VITALS — Ht 68.0 in | Wt 159.0 lb

## 2017-10-10 DIAGNOSIS — Z7902 Long term (current) use of antithrombotics/antiplatelets: Secondary | ICD-10-CM | POA: Insufficient documentation

## 2017-10-10 DIAGNOSIS — I10 Essential (primary) hypertension: Secondary | ICD-10-CM | POA: Diagnosis not present

## 2017-10-10 DIAGNOSIS — Z955 Presence of coronary angioplasty implant and graft: Secondary | ICD-10-CM | POA: Diagnosis not present

## 2017-10-10 DIAGNOSIS — K219 Gastro-esophageal reflux disease without esophagitis: Secondary | ICD-10-CM | POA: Insufficient documentation

## 2017-10-10 DIAGNOSIS — Z79899 Other long term (current) drug therapy: Secondary | ICD-10-CM | POA: Diagnosis not present

## 2017-10-10 DIAGNOSIS — I213 ST elevation (STEMI) myocardial infarction of unspecified site: Secondary | ICD-10-CM | POA: Insufficient documentation

## 2017-10-10 DIAGNOSIS — Z7901 Long term (current) use of anticoagulants: Secondary | ICD-10-CM | POA: Diagnosis not present

## 2017-10-10 NOTE — Progress Notes (Signed)
[name] 83 y.o. female DOB: [birth year] MRN: [id number]      Nutrition Note  1. ST elevation myocardial infarction (STEMI), unspecified artery ([place])   2. Stented coronary artery    Past Medical History:  Diagnosis Date  . Anxiety   . Breast cancer ([place])   . Breast cancer of lower-outer quadrant of left female breast ([place]) 10/11/2014  . GERD (gastroesophageal reflux disease)   . Hypertension   . Personal history of radiation therapy    2016  . PONV (postoperative nausea and vomiting)   . PUD (peptic ulcer disease)   . Stone, kidney    Meds reviewed.   HT: Ht Readings from Last 1 Encounters:  07/27/17 5\' 8"  (1.727 m)    WT: Wt Readings from Last 5 Encounters:  09/19/17 160 lb 1.9 oz (72.6 kg)  08/29/17 163 lb 12.8 oz (74.3 kg)  08/15/17 166 lb 3.2 oz (75.4 kg)  08/07/17 167 lb 8 oz (76 kg)  07/11/17 177 lb 12.8 oz (80.6 kg)     BMI  24.3   Current tobacco use? No   Labs:  Lipid Panel     Component Value Date/Time   CHOL 274 (H) 07/28/2017 0331   CHOL 240 (H) 07/11/2017 1506   TRIG 106 07/28/2017 0331   HDL 62 07/28/2017 0331   HDL 60 07/11/2017 1506   CHOLHDL 4.4 07/28/2017 0331   VLDL 21 07/28/2017 0331   LDLCALC 191 (H) 07/28/2017 0331   LDLCALC 162 (H) 07/11/2017 1506    Lab Results  Component Value Date   HGBA1C 5.8 (H) 07/28/2017   CBG (last 3)  No results for input(s): GLUCAP in the last 72 hours.  Nutrition Note Spoke with pt. Nutrition plan and goals reviewed with pt. Pt is following a heart healthy diet. Pt reports she has lost ~ 17 lb over the past 2 months (9.6% decrease). Suspect some wt loss due to fluid loss given heart failure. Pt reports her appetite has decreased since her hospitalization. Food reportedly does not taste good. Pt encouraged to discuss taste changes with her MD and pharmacist. Pt encouraged that appetite will likely return when she returns to doing more activity. Per discussion, pt wants to maintain her current wt. Wt  maintenance discussed. Pt expressed understanding of the information reviewed. Pt aware of nutrition education classes offered.  Nutrition Diagnosis ? Food-and nutrition-related knowledge deficit related to lack of exposure to information as related to diagnosis of: ? CVD ? Pre-DM ? Overweight related to excessive energy intake as evidenced by a BMI 24.3  Nutrition Intervention ? Pt's individual nutrition plan and goals reviewed with pt.  Nutrition Goal(s):  ? Pt to liberalize her diet until her appetite improves.  ? Pt to identify food quantities necessary to achieve weight maintenance of ~160 lb at graduation from cardiac rehab.   Plan:  Pt to attend nutrition classes ? Nutrition I ? Nutrition II ? Portion Distortion  Will provide client-centered nutrition education as part of interdisciplinary care.   Monitor and evaluate progress toward nutrition goal with team.  Derek Mound, M.Ed, RD, LDN, CDE 10/10/2017 12:10 PM

## 2017-10-10 NOTE — Progress Notes (Signed)
Cardiac Individual Treatment Plan  Patient Details  Name: [name] MRN: [id number] Date of Birth: [birth year] Referring Provider:     CARDIAC REHAB PHASE II ORIENTATION from 10/10/2017 in [place]  Referring Provider  Bensimhon, Daniel      Initial Encounter Date:    CARDIAC REHAB PHASE II ORIENTATION from 10/10/2017 in [place]  Date  10/10/17  Referring Provider  Bensimhon, Quillian Quince      Visit Diagnosis: ST elevation myocardial infarction (STEMI), unspecified artery ([place])  Stented coronary artery  Patient's Home Medications on Admission:  Current Outpatient Medications:  .  amiodarone (PACERONE) 200 MG tablet, Take 1 tablet (200 mg total) by mouth daily., Disp: 90 tablet, Rfl: 3 .  anastrozole (ARIMIDEX) 1 MG tablet, TAKE ONE TABLET BY MOUTH ONCE DAILY, Disp: 90 tablet, Rfl: 4 .  apixaban (ELIQUIS) 2.5 MG TABS tablet, Take 1 tablet (2.5 mg total) by mouth 2 (two) times daily., Disp: 60 tablet, Rfl: 6 .  atorvastatin (LIPITOR) 80 MG tablet, Take 1 tablet (80 mg total) by mouth daily at 6 PM., Disp: 30 tablet, Rfl: 6 .  Calcium Carb-Cholecalciferol (CALCIUM + D3) 600-800 MG-UNIT TABS, Take 1 tablet by mouth daily. , Disp: , Rfl:  .  carvedilol (COREG) 3.125 MG tablet, Take 1 tablet (3.125 mg total) by mouth 2 (two) times daily., Disp: 60 tablet, Rfl: 3 .  cholecalciferol (VITAMIN D) 1000 UNITS tablet, Take 1,000 Units by mouth daily. Over the counter, Disp: , Rfl:  .  citalopram (CELEXA) 10 MG tablet, Take 1 tablet (10 mg total) by mouth daily., Disp: 30 tablet, Rfl: 6 .  clopidogrel (PLAVIX) 75 MG tablet, Take 1 tablet (75 mg total) by mouth daily., Disp: 30 tablet, Rfl: 6 .  co-enzyme Q-10 30 MG capsule, Take 30 mg by mouth 3 (three) times daily., Disp: , Rfl:  .  fish oil-omega-3 fatty acids 1000 MG capsule, Take 1 g by mouth daily., Disp: , Rfl:  .  furosemide (LASIX) 20 MG tablet, Take 1 tablet (20 mg total)  by mouth 2 (two) times a week. Take every Monday and Thursday., Disp: 15 tablet, Rfl: 6 .  losartan (COZAAR) 25 MG tablet, Take 1 tablet (25 mg total) by mouth 2 (two) times daily., Disp: 60 tablet, Rfl: 6 .  magnesium gluconate (MAGONATE) 500 MG tablet, Take 500 mg by mouth 2 (two) times daily., Disp: , Rfl:  .  Multiple Vitamin (MULTIVITAMIN) tablet, Take 1 tablet by mouth daily., Disp: , Rfl:  .  nitroGLYCERIN (NITROSTAT) 0.3 MG SL tablet, Place 1 tablet (0.3 mg total) under the tongue every 5 (five) minutes as needed for chest pain., Disp: 90 tablet, Rfl: 12 .  spironolactone (ALDACTONE) 25 MG tablet, Take 1 tablet (25 mg total) by mouth daily., Disp: 30 tablet, Rfl: 6  Past Medical History: Past Medical History:  Diagnosis Date  . Anxiety   . Breast cancer ([place])   . Breast cancer of lower-outer quadrant of left female breast ([place]) 10/11/2014  . GERD (gastroesophageal reflux disease)   . Hypertension   . Personal history of radiation therapy    2016  . PONV (postoperative nausea and vomiting)   . PUD (peptic ulcer disease)   . Stone, kidney     Tobacco Use: Social History   Tobacco Use  Smoking Status Never Smoker  Smokeless Tobacco Never Used    Labs: Recent Review [facility] for [facility] Cardiac  and Pulmonary Rehab Latest Ref Rng & Units 08/01/2017 08/02/2017 08/03/2017 08/04/2017 08/05/2017   Cholestrol 0 - 200 mg/dL - - - - -   LDLCALC 0 - 99 mg/dL - - - - -   HDL >40 mg/dL - - - - -   Trlycerides <150 mg/dL - - - - -   Hemoglobin A1c 4.8 - 5.6 % - - - - -   PHART 7.350 - 7.450 - - - - -   PCO2ART 32.0 - 48.0 mmHg - - - - -   HCO3 20.0 - 28.0 mmol/L - - - - -   TCO2 22 - 32 mmol/L - - - - -   ACIDBASEDEF 0.0 - 2.0 mmol/L - - - - -   O2SAT % 59.3 53.6 56.3 65.5 61.5      Capillary Blood Glucose: Lab Results  Component Value Date   GLUCAP 117 (H) 08/05/2017   GLUCAP 122 (H) 08/05/2017   GLUCAP 103 (H) 08/05/2017   GLUCAP 109 (H) 08/05/2017   GLUCAP 147 (H)  08/04/2017     Exercise Target Goals: Date: 10/10/17  Exercise Program Goal: Individual exercise prescription set using results from initial 6 min walk test and THRR while considering  patient's activity barriers and safety.   Exercise Prescription Goal: Initial exercise prescription builds to 30-45 minutes a day of aerobic activity, 2-3 days per week.  Home exercise guidelines will be given to patient during program as part of exercise prescription that the participant will acknowledge.  Activity Barriers & Risk Stratification: Activity Barriers & Cardiac Risk Stratification - 10/10/17 0933      Activity Barriers & Cardiac Risk Stratification   Activity Barriers  Muscular Weakness;Shortness of Breath;Deconditioning;Back Problems    Cardiac Risk Stratification  High       6 Minute Walk: 6 Minute Walk    Row Name 10/10/17 1212         6 Minute Walk   Phase  Initial     Distance  1200 feet     Walk Time  6 minutes     # of Rest Breaks  0     MPH  2.3     METS  2     RPE  13     VO2 Peak  7.1     Symptoms  Yes (comment)     Comments  leg fatigue     Resting HR  53 bpm     Resting BP  112/52     Resting Oxygen Saturation   100 %     Exercise Oxygen Saturation  during 6 min walk  98 %     Max Ex. HR  66 bpm     Max Ex. BP  122/60     2 Minute Post BP  110/60        Oxygen Initial Assessment:   Oxygen Re-Evaluation:   Oxygen Discharge (Final Oxygen Re-Evaluation):   Initial Exercise Prescription: Initial Exercise Prescription - 10/10/17 1200      Date of Initial Exercise RX and Referring Provider   Date  10/10/17    Referring Provider  Bensimhon, Daniel      Recumbant Bike   Level  1.5    RPM  60    Watts  10    Minutes  10    METs  1.7      NuStep   Level  2    SPM  60    Minutes  10  METs  1.5      Track   Laps  7    Minutes  10    METs  2.23      Prescription Details   Frequency (times per week)  3    Duration  Progress to 30 minutes  of continuous aerobic without signs/symptoms of physical distress      Intensity   THRR 40-80% of Max Heartrate  56-112    Ratings of Perceived Exertion  11-13    Perceived Dyspnea  0-4      Progression   Progression  Continue to progress workloads to maintain intensity without signs/symptoms of physical distress.      Resistance Training   Training Prescription  Yes    Weight  1lbs    Reps  10-15       Perform Capillary Blood Glucose checks as needed.  Exercise Prescription Changes:   Exercise Comments:   Exercise Goals and Review: Exercise Goals    Row Name 10/10/17 0934             Exercise Goals   Increase Physical Activity  Yes       Intervention  Provide advice, education, support and counseling about physical activity/exercise needs.;Develop an individualized exercise prescription for aerobic and resistive training based on initial evaluation findings, risk stratification, comorbidities and participant's personal goals.       Expected Outcomes  Short Term: Attend rehab on a regular basis to increase amount of physical activity.;Long Term: Add in home exercise to make exercise part of routine and to increase amount of physical activity.;Long Term: Exercising regularly at least 3-5 days a week.       Increase Strength and Stamina  Yes get back to gardening, household chores, cooking and baking       Intervention  Provide advice, education, support and counseling about physical activity/exercise needs.;Develop an individualized exercise prescription for aerobic and resistive training based on initial evaluation findings, risk stratification, comorbidities and participant's personal goals.       Expected Outcomes  Short Term: Increase workloads from initial exercise prescription for resistance, speed, and METs.;Short Term: Perform resistance training exercises routinely during rehab and add in resistance training at home;Long Term: Improve cardiorespiratory fitness, muscular  endurance and strength as measured by increased METs and functional capacity (6MWT)       Able to understand and use rate of perceived exertion (RPE) scale  Yes       Intervention  Provide education and explanation on how to use RPE scale       Expected Outcomes  Short Term: Able to use RPE daily in rehab to express subjective intensity level;Long Term:  Able to use RPE to guide intensity level when exercising independently       Knowledge and understanding of Target Heart Rate Range (THRR)  Yes       Intervention  Provide education and explanation of THRR including how the numbers were predicted and where they are located for reference       Expected Outcomes  Short Term: Able to state/look up THRR;Long Term: Able to use THRR to govern intensity when exercising independently;Short Term: Able to use daily as guideline for intensity in rehab       Able to check pulse independently  Yes       Intervention  Provide education and demonstration on how to check pulse in carotid and radial arteries.;Review the importance of being able to check your own pulse for safety  during independent exercise       Expected Outcomes  Short Term: Able to explain why pulse checking is important during independent exercise;Long Term: Able to check pulse independently and accurately       Understanding of Exercise Prescription  Yes       Intervention  Provide education, explanation, and written materials on patient's individual exercise prescription       Expected Outcomes  Short Term: Able to explain program exercise prescription;Long Term: Able to explain home exercise prescription to exercise independently          Exercise Goals Re-Evaluation :    Discharge Exercise Prescription (Final Exercise Prescription Changes):   Nutrition:  Target Goals: Understanding of nutrition guidelines, daily intake of sodium 1500mg , cholesterol 200mg , calories 30% from fat and 7% or less from saturated fats, daily to have 5 or  more servings of fruits and vegetables.  Biometrics: Pre Biometrics - 10/10/17 1220      Pre Biometrics   Height  5\' 8"  (1.727 m)    Weight  158 lb 15.2 oz (72.1 kg)    Waist Circumference  31.5 inches    Hip Circumference  39.5 inches    Waist to Hip Ratio  0.8 %    BMI (Calculated)  24.17    Triceps Skinfold  24 mm    % Body Fat  35.5 %    Grip Strength  25 kg    Flexibility  0 in    Single Leg Stand  4.97 seconds        Nutrition Therapy Plan and Nutrition Goals: Nutrition Therapy & Goals - 10/10/17 1222      Nutrition Therapy   Diet  General, Healthful      Personal Nutrition Goals   Nutrition Goal  Pt to liberalize her diet until her appetite improves.     Personal Goal #2  Pt to identify food quantities necessary to achieve weight maintenance of ~160 lb at graduation from cardiac rehab.       Intervention Plan   Intervention  Prescribe, educate and counsel regarding individualized specific dietary modifications aiming towards targeted core components such as weight, hypertension, lipid management, diabetes, heart failure and other comorbidities.    Expected Outcomes  Short Term Goal: Understand basic principles of dietary content, such as calories, fat, sodium, cholesterol and nutrients.;Long Term Goal: Adherence to prescribed nutrition plan.       Nutrition Assessments: Nutrition Assessments - 10/10/17 1223      MEDFICTS Scores   Pre Score  21       Nutrition Goals Re-Evaluation:   Nutrition Goals Re-Evaluation:   Nutrition Goals Discharge (Final Nutrition Goals Re-Evaluation):   Psychosocial: Target Goals: Acknowledge presence or absence of significant depression and/or stress, maximize coping skills, provide positive support system. Participant is able to verbalize types and ability to use techniques and skills needed for reducing stress and depression.  Initial Review & Psychosocial Screening: Initial Psych Review & Screening - 10/10/17 1342       Initial Review   Current issues with  None Identified      Family Dynamics   Good Support System?  Yes Louis reports her spouse, family, and friends as sources of support for her.      Barriers   Psychosocial barriers to participate in program  There are no identifiable barriers or psychosocial needs.      Screening Interventions   Interventions  Encouraged to exercise       Quality  of Life Scores: Quality of Life - 10/10/17 1221      Quality of Life Scores   Health/Function Pre  24.19 %    Socioeconomic Pre  27.86 %    Psych/Spiritual Pre  27.86 %    Family Pre  30 %    GLOBAL Pre  26.7 %      Scores of 19 and below usually indicate a poorer quality of life in these areas.  A difference of  2-3 points is a clinically meaningful difference.  A difference of 2-3 points in the total score of the Quality of Life Index has been associated with significant improvement in overall quality of life, self-image, physical symptoms, and general health in studies assessing change in quality of life.  PHQ-9: Recent Review Flowsheet Data    Depression screen [facility] 2/9 07/11/2017 02/28/2017 11/05/2016 07/02/2016 11/17/2015   Decreased Interest 0 0 0 0 0   Down, Depressed, Hopeless 0 0 0 0 0   PHQ - 2 Score 0 0 0 0 0     Interpretation of Total Score  Total Score Depression Severity:  1-4 = Minimal depression, 5-9 = Mild depression, 10-14 = Moderate depression, 15-19 = Moderately severe depression, 20-27 = Severe depression   Psychosocial Evaluation and Intervention:   Psychosocial Re-Evaluation:   Psychosocial Discharge (Final Psychosocial Re-Evaluation):   Vocational Rehabilitation: Provide vocational rehab assistance to qualifying candidates.   Vocational Rehab Evaluation & Intervention: Vocational Rehab - 10/10/17 1340      Initial Vocational Rehab Evaluation & Intervention   Assessment shows need for Vocational Rehabilitation  No       Education: Education Goals: Education  classes will be provided on a weekly basis, covering required topics. Participant will state understanding/return demonstration of topics presented.  Learning Barriers/Preferences: Learning Barriers/Preferences - 10/10/17 0933      Learning Barriers/Preferences   Learning Barriers  Sight    Learning Preferences  Skilled Demonstration;Verbal Instruction;Video;Written Material;Pictoral       Education Topics: Count Your Pulse:  -Group instruction provided by verbal instruction, demonstration, patient participation and written materials to support subject.  Instructors address importance of being able to find your pulse and how to count your pulse when at home without a heart monitor.  Patients get hands on experience counting their pulse with staff help and individually.   Heart Attack, Angina, and Risk Factor Modification:  -Group instruction provided by verbal instruction, video, and written materials to support subject.  Instructors address signs and symptoms of angina and heart attacks.    Also discuss risk factors for heart disease and how to make changes to improve heart health risk factors.   Functional Fitness:  -Group instruction provided by verbal instruction, demonstration, patient participation, and written materials to support subject.  Instructors address safety measures for doing things around the house.  Discuss how to get up and down off the floor, how to pick things up properly, how to safely get out of a chair without assistance, and balance training.   Meditation and Mindfulness:  -Group instruction provided by verbal instruction, patient participation, and written materials to support subject.  Instructor addresses importance of mindfulness and meditation practice to help reduce stress and improve awareness.  Instructor also leads participants through a meditation exercise.    Stretching for Flexibility and Mobility:  -Group instruction provided by verbal instruction,  patient participation, and written materials to support subject.  Instructors lead participants through series of stretches that are designed to increase flexibility thus  improving mobility.  These stretches are additional exercise for major muscle groups that are typically performed during regular warm up and cool down.   Hands Only CPR:  -Group verbal, video, and participation provides a basic overview of AHA guidelines for community CPR. Role-play of emergencies allow participants the opportunity to practice calling for help and chest compression technique with discussion of AED use.   Hypertension: -Group verbal and written instruction that provides a basic overview of hypertension including the most recent diagnostic guidelines, risk factor reduction with self-care instructions and medication management.    Nutrition I class: Heart Healthy Eating:  -Group instruction provided by PowerPoint slides, verbal discussion, and written materials to support subject matter. The instructor gives an explanation and review of the Therapeutic Lifestyle Changes diet recommendations, which includes a discussion on lipid goals, dietary fat, sodium, fiber, plant stanol/sterol esters, sugar, and the components of a well-balanced, healthy diet.   Nutrition II class: Lifestyle Skills:  -Group instruction provided by PowerPoint slides, verbal discussion, and written materials to support subject matter. The instructor gives an explanation and review of label reading, grocery shopping for heart health, heart healthy recipe modifications, and ways to make healthier choices when eating out.   Diabetes Question & Answer:  -Group instruction provided by PowerPoint slides, verbal discussion, and written materials to support subject matter. The instructor gives an explanation and review of diabetes co-morbidities, pre- and post-prandial blood glucose goals, pre-exercise blood glucose goals, signs, symptoms, and  treatment of hypoglycemia and hyperglycemia, and foot care basics.   Diabetes Blitz:  -Group instruction provided by PowerPoint slides, verbal discussion, and written materials to support subject matter. The instructor gives an explanation and review of the physiology behind type 1 and type 2 diabetes, diabetes medications and rational behind using different medications, pre- and post-prandial blood glucose recommendations and Hemoglobin A1c goals, diabetes diet, and exercise including blood glucose guidelines for exercising safely.    Portion Distortion:  -Group instruction provided by PowerPoint slides, verbal discussion, written materials, and food models to support subject matter. The instructor gives an explanation of serving size versus portion size, changes in portions sizes over the last 20 years, and what consists of a serving from each food group.   Stress Management:  -Group instruction provided by verbal instruction, video, and written materials to support subject matter.  Instructors review role of stress in heart disease and how to cope with stress positively.     Exercising on Your Own:  -Group instruction provided by verbal instruction, power point, and written materials to support subject.  Instructors discuss benefits of exercise, components of exercise, frequency and intensity of exercise, and end points for exercise.  Also discuss use of nitroglycerin and activating EMS.  Review options of places to exercise outside of rehab.  Review guidelines for sex with heart disease.   Cardiac Drugs I:  -Group instruction provided by verbal instruction and written materials to support subject.  Instructor reviews cardiac drug classes: antiplatelets, anticoagulants, beta blockers, and statins.  Instructor discusses reasons, side effects, and lifestyle considerations for each drug class.   Cardiac Drugs II:  -Group instruction provided by verbal instruction and written materials to  support subject.  Instructor reviews cardiac drug classes: angiotensin converting enzyme inhibitors (ACE-I), angiotensin II receptor blockers (ARBs), nitrates, and calcium channel blockers.  Instructor discusses reasons, side effects, and lifestyle considerations for each drug class.   Anatomy and Physiology of the Circulatory System:  Group verbal and written instruction and models provide  basic cardiac anatomy and physiology, with the coronary electrical and arterial systems. Review of: AMI, Angina, Valve disease, Heart Failure, Peripheral Artery Disease, Cardiac Arrhythmia, Pacemakers, and the ICD.   Other Education:  -Group or individual verbal, written, or video instructions that support the educational goals of the cardiac rehab program.   Holiday Eating Survival Tips:  -Group instruction provided by PowerPoint slides, verbal discussion, and written materials to support subject matter. The instructor gives patients tips, tricks, and techniques to help them not only survive but enjoy the holidays despite the onslaught of food that accompanies the holidays.   Knowledge Questionnaire Score: Knowledge Questionnaire Score - 10/10/17 1210      Knowledge Questionnaire Score   Pre Score  17/24       Core Components/Risk Factors/Patient Goals at Admission: Personal Goals and Risk Factors at Admission - 10/10/17 1221      Core Components/Risk Factors/Patient Goals on Admission   Heart Failure  Yes    Intervention  Provide a combined exercise and nutrition program that is supplemented with education, support and counseling about heart failure. Directed toward relieving symptoms such as shortness of breath, decreased exercise tolerance, and extremity edema.    Expected Outcomes  Improve functional capacity of life;Short term: Attendance in program 2-3 days a week with increased exercise capacity. Reported lower sodium intake. Reported increased fruit and vegetable intake. Reports medication  compliance.;Short term: Daily weights obtained and reported for increase. Utilizing diuretic protocols set by physician.;Long term: Adoption of self-care skills and reduction of barriers for early signs and symptoms recognition and intervention leading to self-care maintenance.    Hypertension  Yes    Intervention  Provide education on lifestyle modifcations including regular physical activity/exercise, weight management, moderate sodium restriction and increased consumption of fresh fruit, vegetables, and low fat dairy, alcohol moderation, and smoking cessation.;Monitor prescription use compliance.    Expected Outcomes  Short Term: Continued assessment and intervention until BP is < 140/79mm HG in hypertensive participants. < 130/14mm HG in hypertensive participants with diabetes, heart failure or chronic kidney disease.;Long Term: Maintenance of blood pressure at goal levels.       Core Components/Risk Factors/Patient Goals Review:    Core Components/Risk Factors/Patient Goals at Discharge (Final Review):    ITP Comments: ITP Comments    Row Name 10/10/17 1209           ITP Comments  Dr. Fransico Him, Medical Director           Comments: Patient attended orientation from [phone] to 1020 to review rules and guidelines for program. Completed 6 minute walk test, Intitial ITP, and exercise prescription.  VSS. Telemetry-SR.  Asymptomatic.

## 2017-10-14 ENCOUNTER — Telehealth (HOSPITAL_COMMUNITY): Payer: Self-pay | Admitting: *Deleted

## 2017-10-14 ENCOUNTER — Encounter (HOSPITAL_COMMUNITY): Payer: Medicare Other

## 2017-10-14 ENCOUNTER — Encounter (HOSPITAL_COMMUNITY)
Admission: RE | Admit: 2017-10-14 | Discharge: 2017-10-14 | Disposition: A | Discharge status: Home / Self Care | Payer: Medicare Other | Source: Ambulatory Visit | Attending: Internal Medicine | Admitting: Internal Medicine

## 2017-10-14 NOTE — Progress Notes (Addendum)
Incomplete Session Note  Patient Details  Name: [name] MRN: [id number] Date of Birth: 1935/04/12 Referring Provider:     CARDIAC REHAB PHASE II ORIENTATION from 10/10/2017 in [place]  Referring Provider  Bensimhon, Miette Molenda did not complete her rehab session.  [name] reported to cardiac rehab this afternoon and said she overslept. Patient reported feeling dizzy from rushing. Patient placed on telemetry. Rhythm sinus Brady low 50's. [name] reports that her husbands heart rate is sometimes in the 40's when she checks her blood pressures at home. Sitting blood pressure 122/60. Standing blood pressure 122/60. Heart rate 53. Oxygen saturation 98% on room air. Lung fields clear upon ascultation. Weight 71.8 kg. [name] at the heart failure clinic called and notified. [name] did not exercise today. Today's ECG tracings and vital signs faxed to Dr Bensimhon's office for review. [name] also reported experiencing shortness of breath on Saturday and Sunday. Patient denies feeling short of breath this afternoon.Patient was told at the heart failure clinic that she may begin exercise on Wednesday as long as she is feeling better. Patient states understanding. [name], RN,BSN 10/14/2017 4:40 PM

## 2017-10-14 NOTE — Telephone Encounter (Signed)
[name] called to report patient was light headed and was experiencing shortness of breath therefore did not participate in exercise in cardiac rehab. Pt has been experiencing heart rates in the 40's at home since starting carvedilol. Pt was in normal sinus rhythm with a rate of 52. Blood pressure was 122/60 oxygen 98% and her weight is stable. Per Caryl Pina ok to stop carvedilol but bring in for office visit in 2 weeks. Patient to call the office if she doesn't feel better and heart rate stays low. Patient and Verdis Frederickson aware and agreeable with plan.

## 2017-10-16 ENCOUNTER — Encounter (HOSPITAL_COMMUNITY)
Admission: RE | Admit: 2017-10-16 | Discharge: 2017-10-16 | Disposition: A | Discharge status: Home / Self Care | Payer: Medicare Other | Source: Ambulatory Visit | Attending: Internal Medicine | Admitting: Internal Medicine

## 2017-10-16 ENCOUNTER — Encounter (HOSPITAL_COMMUNITY): Payer: Medicare Other

## 2017-10-16 DIAGNOSIS — I213 ST elevation (STEMI) myocardial infarction of unspecified site: Secondary | ICD-10-CM

## 2017-10-16 DIAGNOSIS — Z7902 Long term (current) use of antithrombotics/antiplatelets: Secondary | ICD-10-CM | POA: Diagnosis not present

## 2017-10-16 DIAGNOSIS — Z79899 Other long term (current) drug therapy: Secondary | ICD-10-CM | POA: Diagnosis not present

## 2017-10-16 DIAGNOSIS — Z7901 Long term (current) use of anticoagulants: Secondary | ICD-10-CM | POA: Diagnosis not present

## 2017-10-16 DIAGNOSIS — Z955 Presence of coronary angioplasty implant and graft: Secondary | ICD-10-CM

## 2017-10-16 DIAGNOSIS — I10 Essential (primary) hypertension: Secondary | ICD-10-CM | POA: Diagnosis not present

## 2017-10-16 DIAGNOSIS — K219 Gastro-esophageal reflux disease without esophagitis: Secondary | ICD-10-CM | POA: Diagnosis not present

## 2017-10-16 NOTE — Progress Notes (Signed)
Daily Session Note  Patient Details  Name: [name] MRN: [id number] Date of Birth: 1935-04-26 Referring Provider:     CARDIAC REHAB PHASE II ORIENTATION from 10/10/2017 in [place]  Referring Provider  [name]      Encounter Date: 10/16/2017  Check In: Session Check In - 10/16/17 1646      Check-In   Location  [facility]    Staff Present  Mary Carol, MS, ACSM CEP, Exercise Physiologist;Mary Fair, MS, ACSM RCEP, Exercise Physiologist;Mary Venetia Maxon, RN, Mary Deiters, RN BSN    Supervising physician immediately available to respond to emergencies  [facility] immediately available    Physician(s)  Dr. Loleta [name]     Medication changes reported      No    Fall or balance concerns reported     No    Tobacco Cessation  No Change    Warm-up and Cool-down  Performed as group-led instruction    Resistance Training Performed  No    VAD Patient?  No      Pain Assessment   Currently in Pain?  No/denies    Multiple Pain Sites  No       Capillary Blood Glucose: No results found for this or any previous visit (from the past 24 hour(s)).  Exercise Prescription Changes - 10/16/17 1508      Response to Exercise   Blood Pressure (Admit)  132/72    Blood Pressure (Exercise)  138/72    Blood Pressure (Exit)  126/80    Heart Rate (Admit)  57 bpm    Heart Rate (Exercise)  76 bpm    Heart Rate (Exit)  57 bpm    Rating of Perceived Exertion (Exercise)  13    Symptoms  none    Duration  Progress to 30 minutes of  aerobic without signs/symptoms of physical distress    Intensity  THRR unchanged      Progression   Progression  Continue to progress workloads to maintain intensity without signs/symptoms of physical distress.    Average METs  2.1      Resistance Training   Training Prescription  No Relaxation day, no weights.      Interval Training   Interval Training  No      Recumbant Bike   Level  1.5    Minutes  10      NuStep   Level  2    SPM  60    Minutes  10    METs  1.9      Track   Laps  7    Minutes  10    METs  2.22       Social History   Tobacco Use  Smoking Status Never Smoker  Smokeless Tobacco Never Used    Goals Met:  No report of cardiac concerns or symptoms  Goals Unmet:  Not Applicable  Comments: [name] started cardiac rehab today.  Pt tolerated light exercise without difficulty. VSS, telemetry-Sinus Brady, Sinus Rhythm, asymptomatic.  Medication list reconciled. Pt denies barriers to medicaiton compliance.  PSYCHOSOCIAL ASSESSMENT:  PHQ-0. Pt exhibits positive coping skills, hopeful outlook with supportive family. No psychosocial needs identified at this time, no psychosocial interventions necessary.    Pt enjoys gardening and working around the house.   Pt oriented to exercise equipment and routine.    Understanding verbalized.Mary Pall, RN,BSN 10/16/2017 4:56 PM   Dr. Fransico [name] is Medical Director for Cardiac Rehab at  [facility].

## 2017-10-17 NOTE — Progress Notes (Signed)
Cardiac Individual Treatment Plan  Patient Details  Name: [name] MRN: [id number] Date of Birth: 10-07-1934 Referring Provider:     CARDIAC REHAB PHASE II ORIENTATION from 10/10/2017 in [facility]  Referring Provider  Bensimhon, Daniel      Initial Encounter Date:    CARDIAC REHAB PHASE II ORIENTATION from 10/10/2017 in [facility]  Date  10/10/17  Referring Provider  Bensimhon, Quillian Quince      Visit Diagnosis: ST elevation myocardial infarction (STEMI), unspecified artery ([place])  Stented coronary artery  Patient's Home Medications on Admission:  Current Outpatient Medications:  .  amiodarone (PACERONE) 200 MG tablet, Take 1 tablet (200 mg total) by mouth daily., Disp: 90 tablet, Rfl: 3 .  anastrozole (ARIMIDEX) 1 MG tablet, TAKE ONE TABLET BY MOUTH ONCE DAILY, Disp: 90 tablet, Rfl: 4 .  apixaban (ELIQUIS) 2.5 MG TABS tablet, Take 1 tablet (2.5 mg total) by mouth 2 (two) times daily., Disp: 60 tablet, Rfl: 6 .  atorvastatin (LIPITOR) 80 MG tablet, Take 1 tablet (80 mg total) by mouth daily at 6 PM., Disp: 30 tablet, Rfl: 6 .  Calcium Carb-Cholecalciferol (CALCIUM + D3) 600-800 MG-UNIT TABS, Take 1 tablet by mouth daily. , Disp: , Rfl:  .  cholecalciferol (VITAMIN D) 1000 UNITS tablet, Take 1,000 Units by mouth daily. Over the counter, Disp: , Rfl:  .  citalopram (CELEXA) 10 MG tablet, Take 1 tablet (10 mg total) by mouth daily., Disp: 30 tablet, Rfl: 6 .  clopidogrel (PLAVIX) 75 MG tablet, Take 1 tablet (75 mg total) by mouth daily., Disp: 30 tablet, Rfl: 6 .  co-enzyme Q-10 30 MG capsule, Take 30 mg by mouth 3 (three) times daily., Disp: , Rfl:  .  fish oil-omega-3 fatty acids 1000 MG capsule, Take 1 g by mouth daily., Disp: , Rfl:  .  furosemide (LASIX) 20 MG tablet, Take 1 tablet (20 mg total) by mouth 2 (two) times a week. Take every Monday and Thursday., Disp: 15 tablet, Rfl: 6 .  losartan (COZAAR) 25 MG tablet,  Take 1 tablet (25 mg total) by mouth 2 (two) times daily., Disp: 60 tablet, Rfl: 6 .  magnesium gluconate (MAGONATE) 500 MG tablet, Take 500 mg by mouth 2 (two) times daily., Disp: , Rfl:  .  Multiple Vitamin (MULTIVITAMIN) tablet, Take 1 tablet by mouth daily., Disp: , Rfl:  .  nitroGLYCERIN (NITROSTAT) 0.3 MG SL tablet, Place 1 tablet (0.3 mg total) under the tongue every 5 (five) minutes as needed for chest pain., Disp: 90 tablet, Rfl: 12 .  spironolactone (ALDACTONE) 25 MG tablet, Take 1 tablet (25 mg total) by mouth daily., Disp: 30 tablet, Rfl: 6  Past Medical History: Past Medical History:  Diagnosis Date  . Anxiety   . Breast cancer ([place])   . Breast cancer of lower-outer quadrant of left female breast ([place]) 10/11/2014  . GERD (gastroesophageal reflux disease)   . Hypertension   . Personal history of radiation therapy    2016  . PONV (postoperative nausea and vomiting)   . PUD (peptic ulcer disease)   . Stone, kidney     Tobacco Use: Social History   Tobacco Use  Smoking Status Never Smoker  Smokeless Tobacco Never Used    Labs: Recent Review Scientist, physiological    Labs for ITP Cardiac and Pulmonary Rehab Latest Ref Rng & Units 08/01/2017 08/02/2017 08/03/2017 08/04/2017 08/05/2017   Cholestrol 0 - 200 mg/dL - - - - -  LDLCALC 0 - 99 mg/dL - - - - -   HDL >40 mg/dL - - - - -   Trlycerides <150 mg/dL - - - - -   Hemoglobin A1c 4.8 - 5.6 % - - - - -   PHART 7.350 - 7.450 - - - - -   PCO2ART 32.0 - 48.0 mmHg - - - - -   HCO3 20.0 - 28.0 mmol/L - - - - -   TCO2 22 - 32 mmol/L - - - - -   ACIDBASEDEF 0.0 - 2.0 mmol/L - - - - -   O2SAT % 59.3 53.6 56.3 65.5 61.5      Capillary Blood Glucose: Lab Results  Component Value Date   GLUCAP 117 (H) 08/05/2017   GLUCAP 122 (H) 08/05/2017   GLUCAP 103 (H) 08/05/2017   GLUCAP 109 (H) 08/05/2017   GLUCAP 147 (H) 08/04/2017     Exercise Target Goals:    Exercise Program Goal: Individual exercise prescription set using results from  initial 6 min walk test and THRR while considering  patient's activity barriers and safety.   Exercise Prescription Goal: Initial exercise prescription builds to 30-45 minutes a day of aerobic activity, 2-3 days per week.  Home exercise guidelines will be given to patient during program as part of exercise prescription that the participant will acknowledge.  Activity Barriers & Risk Stratification: Activity Barriers & Cardiac Risk Stratification - 10/10/17 0933      Activity Barriers & Cardiac Risk Stratification   Activity Barriers  Muscular Weakness;Shortness of Breath;Deconditioning;Back Problems    Cardiac Risk Stratification  High       6 Minute Walk: 6 Minute Walk    Row Name 10/10/17 1212         6 Minute Walk   Phase  Initial     Distance  1200 feet     Walk Time  6 minutes     # of Rest Breaks  0     MPH  2.3     METS  2     RPE  13     VO2 Peak  7.1     Symptoms  Yes (comment)     Comments  leg fatigue     Resting HR  53 bpm     Resting BP  112/52     Resting Oxygen Saturation   100 %     Exercise Oxygen Saturation  during 6 min walk  98 %     Max Ex. HR  66 bpm     Max Ex. BP  122/60     2 Minute Post BP  110/60        Oxygen Initial Assessment:   Oxygen Re-Evaluation:   Oxygen Discharge (Final Oxygen Re-Evaluation):   Initial Exercise Prescription: Initial Exercise Prescription - 10/10/17 1200      Date of Initial Exercise RX and Referring Provider   Date  10/10/17    Referring Provider  Bensimhon, Daniel      Recumbant Bike   Level  1.5    RPM  60    Watts  10    Minutes  10    METs  1.7      NuStep   Level  2    SPM  60    Minutes  10    METs  1.5      Track   Laps  7    Minutes  10    METs  2.23      Prescription Details   Frequency (times per week)  3    Duration  Progress to 30 minutes of continuous aerobic without signs/symptoms of physical distress      Intensity   THRR 40-80% of Max Heartrate  56-112    Ratings of  Perceived Exertion  11-13    Perceived Dyspnea  0-4      Progression   Progression  Continue to progress workloads to maintain intensity without signs/symptoms of physical distress.      Resistance Training   Training Prescription  Yes    Weight  1lbs    Reps  10-15       Perform Capillary Blood Glucose checks as needed.  Exercise Prescription Changes: Exercise Prescription Changes    Row Name 10/16/17 1508             Response to Exercise   Blood Pressure (Admit)  132/72       Blood Pressure (Exercise)  138/72       Blood Pressure (Exit)  126/80       Heart Rate (Admit)  57 bpm       Heart Rate (Exercise)  76 bpm       Heart Rate (Exit)  57 bpm       Rating of Perceived Exertion (Exercise)  13       Symptoms  none       Duration  Progress to 30 minutes of  aerobic without signs/symptoms of physical distress       Intensity  THRR unchanged         Progression   Progression  Continue to progress workloads to maintain intensity without signs/symptoms of physical distress.       Average METs  2.1         Resistance Training   Training Prescription  No Relaxation day, no weights.         Interval Training   Interval Training  No         Recumbant Bike   Level  1.5       Minutes  10         NuStep   Level  2       SPM  60       Minutes  10       METs  1.9         Track   Laps  7       Minutes  10       METs  2.22          Exercise Comments: Exercise Comments    Row Name 10/16/17 1508           Exercise Comments  Patient off to a good start with exercise.          Exercise Goals and Review: Exercise Goals    Row Name 10/10/17 0934             Exercise Goals   Increase Physical Activity  Yes       Intervention  Provide advice, education, support and counseling about physical activity/exercise needs.;Develop an individualized exercise prescription for aerobic and resistive training based on initial evaluation findings, risk stratification,  comorbidities and participant's personal goals.       Expected Outcomes  Short Term: Attend rehab on a regular basis to increase amount of physical activity.;Long Term: Add in home exercise to make exercise part of routine and to increase  amount of physical activity.;Long Term: Exercising regularly at least 3-5 days a week.       Increase Strength and Stamina  Yes get back to gardening, household chores, cooking and baking       Intervention  Provide advice, education, support and counseling about physical activity/exercise needs.;Develop an individualized exercise prescription for aerobic and resistive training based on initial evaluation findings, risk stratification, comorbidities and participant's personal goals.       Expected Outcomes  Short Term: Increase workloads from initial exercise prescription for resistance, speed, and METs.;Short Term: Perform resistance training exercises routinely during rehab and add in resistance training at home;Long Term: Improve cardiorespiratory fitness, muscular endurance and strength as measured by increased METs and functional capacity (6MWT)       Able to understand and use rate of perceived exertion (RPE) scale  Yes       Intervention  Provide education and explanation on how to use RPE scale       Expected Outcomes  Short Term: Able to use RPE daily in rehab to express subjective intensity level;Long Term:  Able to use RPE to guide intensity level when exercising independently       Knowledge and understanding of Target Heart Rate Range (THRR)  Yes       Intervention  Provide education and explanation of THRR including how the numbers were predicted and where they are located for reference       Expected Outcomes  Short Term: Able to state/look up THRR;Long Term: Able to use THRR to govern intensity when exercising independently;Short Term: Able to use daily as guideline for intensity in rehab       Able to check pulse independently  Yes       Intervention   Provide education and demonstration on how to check pulse in carotid and radial arteries.;Review the importance of being able to check your own pulse for safety during independent exercise       Expected Outcomes  Short Term: Able to explain why pulse checking is important during independent exercise;Long Term: Able to check pulse independently and accurately       Understanding of Exercise Prescription  Yes       Intervention  Provide education, explanation, and written materials on patient's individual exercise prescription       Expected Outcomes  Short Term: Able to explain program exercise prescription;Long Term: Able to explain home exercise prescription to exercise independently          Exercise Goals Re-Evaluation : Exercise Goals Re-Evaluation    Row Name 10/16/17 1648             Exercise Goal Re-Evaluation   Exercise Goals Review  Increase Physical Activity;Able to understand and use rate of perceived exertion (RPE) scale       Comments  Patient tolerated low intensity exercise without c/o. Patient understands and is able to use the RPE scale appropriately.       Expected Outcomes  Increase workloads as tolerated to help achieve personal health and fitness goals.           Discharge Exercise Prescription (Final Exercise Prescription Changes): Exercise Prescription Changes - 10/16/17 1508      Response to Exercise   Blood Pressure (Admit)  132/72    Blood Pressure (Exercise)  138/72    Blood Pressure (Exit)  126/80    Heart Rate (Admit)  57 bpm    Heart Rate (Exercise)  76 bpm  Heart Rate (Exit)  57 bpm    Rating of Perceived Exertion (Exercise)  13    Symptoms  none    Duration  Progress to 30 minutes of  aerobic without signs/symptoms of physical distress    Intensity  THRR unchanged      Progression   Progression  Continue to progress workloads to maintain intensity without signs/symptoms of physical distress.    Average METs  2.1      Resistance Training    Training Prescription  No Relaxation day, no weights.      Interval Training   Interval Training  No      Recumbant Bike   Level  1.5    Minutes  10      NuStep   Level  2    SPM  60    Minutes  10    METs  1.9      Track   Laps  7    Minutes  10    METs  2.22       Nutrition:  Target Goals: Understanding of nutrition guidelines, daily intake of sodium 1500mg , cholesterol 200mg , calories 30% from fat and 7% or less from saturated fats, daily to have 5 or more servings of fruits and vegetables.  Biometrics: Pre Biometrics - 10/10/17 1220      Pre Biometrics   Height  5\' 8"  (1.727 m)    Weight  158 lb 15.2 oz (72.1 kg)    Waist Circumference  31.5 inches    Hip Circumference  39.5 inches    Waist to Hip Ratio  0.8 %    BMI (Calculated)  24.17    Triceps Skinfold  24 mm    % Body Fat  35.5 %    Grip Strength  25 kg    Flexibility  0 in    Single Leg Stand  4.97 seconds        Nutrition Therapy Plan and Nutrition Goals: Nutrition Therapy & Goals - 10/10/17 1222      Nutrition Therapy   Diet  General, Healthful      Personal Nutrition Goals   Nutrition Goal  Pt to liberalize her diet until her appetite improves.     Personal Goal #2  Pt to identify food quantities necessary to achieve weight maintenance of ~160 lb at graduation from cardiac rehab.       Intervention Plan   Intervention  Prescribe, educate and counsel regarding individualized specific dietary modifications aiming towards targeted core components such as weight, hypertension, lipid management, diabetes, heart failure and other comorbidities.    Expected Outcomes  Short Term Goal: Understand basic principles of dietary content, such as calories, fat, sodium, cholesterol and nutrients.;Long Term Goal: Adherence to prescribed nutrition plan.       Nutrition Assessments: Nutrition Assessments - 10/10/17 1223      MEDFICTS Scores   Pre Score  21       Nutrition Goals  Re-Evaluation:   Nutrition Goals Re-Evaluation:   Nutrition Goals Discharge (Final Nutrition Goals Re-Evaluation):   Psychosocial: Target Goals: Acknowledge presence or absence of significant depression and/or stress, maximize coping skills, provide positive support system. Participant is able to verbalize types and ability to use techniques and skills needed for reducing stress and depression.  Initial Review & Psychosocial Screening: Initial Psych Review & Screening - 10/10/17 1342      Initial Review   Current issues with  None Identified      Family  Dynamics   Good Support System?  Yes Homer reports her spouse, family, and friends as sources of support for her.      Barriers   Psychosocial barriers to participate in program  There are no identifiable barriers or psychosocial needs.      Screening Interventions   Interventions  Encouraged to exercise       Quality of Life Scores: Quality of Life - 10/10/17 1221      Quality of Life Scores   Health/Function Pre  24.19 %    Socioeconomic Pre  27.86 %    Psych/Spiritual Pre  27.86 %    Family Pre  30 %    GLOBAL Pre  26.7 %      Scores of 19 and below usually indicate a poorer quality of life in these areas.  A difference of  2-3 points is a clinically meaningful difference.  A difference of 2-3 points in the total score of the Quality of Life Index has been associated with significant improvement in overall quality of life, self-image, physical symptoms, and general health in studies assessing change in quality of life.  PHQ-9: Recent Review Flowsheet Data    Depression screen [facility] 2/9 10/14/2017 07/11/2017 02/28/2017 11/05/2016 07/02/2016   Decreased Interest 0 0 0 0 0   Down, Depressed, Hopeless 0 0 0 0 0   PHQ - 2 Score 0 0 0 0 0     Interpretation of Total Score  Total Score Depression Severity:  1-4 = Minimal depression, 5-9 = Mild depression, 10-14 = Moderate depression, 15-19 = Moderately severe depression, 20-27 =  Severe depression   Psychosocial Evaluation and Intervention:   Psychosocial Re-Evaluation:   Psychosocial Discharge (Final Psychosocial Re-Evaluation):   Vocational Rehabilitation: Provide vocational rehab assistance to qualifying candidates.   Vocational Rehab Evaluation & Intervention: Vocational Rehab - 10/10/17 1340      Initial Vocational Rehab Evaluation & Intervention   Assessment shows need for Vocational Rehabilitation  No       Education: Education Goals: Education classes will be provided on a weekly basis, covering required topics. Participant will state understanding/return demonstration of topics presented.  Learning Barriers/Preferences: Learning Barriers/Preferences - 10/10/17 0933      Learning Barriers/Preferences   Learning Barriers  Sight    Learning Preferences  Skilled Demonstration;Verbal Instruction;Video;Written Material;Pictoral       Education Topics: Count Your Pulse:  -Group instruction provided by verbal instruction, demonstration, patient participation and written materials to support subject.  Instructors address importance of being able to find your pulse and how to count your pulse when at home without a heart monitor.  Patients get hands on experience counting their pulse with staff help and individually.   Heart Attack, Angina, and Risk Factor Modification:  -Group instruction provided by verbal instruction, video, and written materials to support subject.  Instructors address signs and symptoms of angina and heart attacks.    Also discuss risk factors for heart disease and how to make changes to improve heart health risk factors.   Functional Fitness:  -Group instruction provided by verbal instruction, demonstration, patient participation, and written materials to support subject.  Instructors address safety measures for doing things around the house.  Discuss how to get up and down off the floor, how to pick things up properly, how to  safely get out of a chair without assistance, and balance training.   Meditation and Mindfulness:  -Group instruction provided by verbal instruction, patient participation, and written materials to  support subject.  Instructor addresses importance of mindfulness and meditation practice to help reduce stress and improve awareness.  Instructor also leads participants through a meditation exercise.    Stretching for Flexibility and Mobility:  -Group instruction provided by verbal instruction, patient participation, and written materials to support subject.  Instructors lead participants through series of stretches that are designed to increase flexibility thus improving mobility.  These stretches are additional exercise for major muscle groups that are typically performed during regular warm up and cool down.   Hands Only CPR:  -Group verbal, video, and participation provides a basic overview of AHA guidelines for community CPR. Role-play of emergencies allow participants the opportunity to practice calling for help and chest compression technique with discussion of AED use.   Hypertension: -Group verbal and written instruction that provides a basic overview of hypertension including the most recent diagnostic guidelines, risk factor reduction with self-care instructions and medication management.    Nutrition I class: Heart Healthy Eating:  -Group instruction provided by PowerPoint slides, verbal discussion, and written materials to support subject matter. The instructor gives an explanation and review of the Therapeutic Lifestyle Changes diet recommendations, which includes a discussion on lipid goals, dietary fat, sodium, fiber, plant stanol/sterol esters, sugar, and the components of a well-balanced, healthy diet.   Nutrition II class: Lifestyle Skills:  -Group instruction provided by PowerPoint slides, verbal discussion, and written materials to support subject matter. The instructor gives  an explanation and review of label reading, grocery shopping for heart health, heart healthy recipe modifications, and ways to make healthier choices when eating out.   Diabetes Question & Answer:  -Group instruction provided by PowerPoint slides, verbal discussion, and written materials to support subject matter. The instructor gives an explanation and review of diabetes co-morbidities, pre- and post-prandial blood glucose goals, pre-exercise blood glucose goals, signs, symptoms, and treatment of hypoglycemia and hyperglycemia, and foot care basics.   Diabetes Blitz:  -Group instruction provided by PowerPoint slides, verbal discussion, and written materials to support subject matter. The instructor gives an explanation and review of the physiology behind type 1 and type 2 diabetes, diabetes medications and rational behind using different medications, pre- and post-prandial blood glucose recommendations and Hemoglobin A1c goals, diabetes diet, and exercise including blood glucose guidelines for exercising safely.    Portion Distortion:  -Group instruction provided by PowerPoint slides, verbal discussion, written materials, and food models to support subject matter. The instructor gives an explanation of serving size versus portion size, changes in portions sizes over the last 20 years, and what consists of a serving from each food group.   Stress Management:  -Group instruction provided by verbal instruction, video, and written materials to support subject matter.  Instructors review role of stress in heart disease and how to cope with stress positively.     Exercising on Your Own:  -Group instruction provided by verbal instruction, power point, and written materials to support subject.  Instructors discuss benefits of exercise, components of exercise, frequency and intensity of exercise, and end points for exercise.  Also discuss use of nitroglycerin and activating EMS.  Review options of places  to exercise outside of rehab.  Review guidelines for sex with heart disease.   Cardiac Drugs I:  -Group instruction provided by verbal instruction and written materials to support subject.  Instructor reviews cardiac drug classes: antiplatelets, anticoagulants, beta blockers, and statins.  Instructor discusses reasons, side effects, and lifestyle considerations for each drug class.   Cardiac Drugs II:  -  Group instruction provided by verbal instruction and written materials to support subject.  Instructor reviews cardiac drug classes: angiotensin converting enzyme inhibitors (ACE-I), angiotensin II receptor blockers (ARBs), nitrates, and calcium channel blockers.  Instructor discusses reasons, side effects, and lifestyle considerations for each drug class.   Anatomy and Physiology of the Circulatory System:  Group verbal and written instruction and models provide basic cardiac anatomy and physiology, with the coronary electrical and arterial systems. Review of: AMI, Angina, Valve disease, Heart Failure, Peripheral Artery Disease, Cardiac Arrhythmia, Pacemakers, and the ICD.   Other Education:  -Group or individual verbal, written, or video instructions that support the educational goals of the cardiac rehab program.   Holiday Eating Survival Tips:  -Group instruction provided by PowerPoint slides, verbal discussion, and written materials to support subject matter. The instructor gives patients tips, tricks, and techniques to help them not only survive but enjoy the holidays despite the onslaught of food that accompanies the holidays.   Knowledge Questionnaire Score: Knowledge Questionnaire Score - 10/10/17 1210      Knowledge Questionnaire Score   Pre Score  17/24       Core Components/Risk Factors/Patient Goals at Admission: Personal Goals and Risk Factors at Admission - 10/10/17 1221      Core Components/Risk Factors/Patient Goals on Admission   Heart Failure  Yes    Intervention   Provide a combined exercise and nutrition program that is supplemented with education, support and counseling about heart failure. Directed toward relieving symptoms such as shortness of breath, decreased exercise tolerance, and extremity edema.    Expected Outcomes  Improve functional capacity of life;Short term: Attendance in program 2-3 days a week with increased exercise capacity. Reported lower sodium intake. Reported increased fruit and vegetable intake. Reports medication compliance.;Short term: Daily weights obtained and reported for increase. Utilizing diuretic protocols set by physician.;Long term: Adoption of self-care skills and reduction of barriers for early signs and symptoms recognition and intervention leading to self-care maintenance.    Hypertension  Yes    Intervention  Provide education on lifestyle modifcations including regular physical activity/exercise, weight management, moderate sodium restriction and increased consumption of fresh fruit, vegetables, and low fat dairy, alcohol moderation, and smoking cessation.;Monitor prescription use compliance.    Expected Outcomes  Short Term: Continued assessment and intervention until BP is < 140/67mm HG in hypertensive participants. < 130/34mm HG in hypertensive participants with diabetes, heart failure or chronic kidney disease.;Long Term: Maintenance of blood pressure at goal levels.       Core Components/Risk Factors/Patient Goals Review:    Core Components/Risk Factors/Patient Goals at Discharge (Final Review):    ITP Comments: ITP Comments    Row Name 10/10/17 1209 10/17/17 1327         ITP Comments  Dr. Fransico Him, Medical Director   30 day ITP comment. Patient started exercise at cardiac rehab today.         Comments: See ITP comments. Sherrica first day of exercise was on 10/16/17.[name], RN,BSN 10/17/2017 1:39 PM

## 2017-10-18 ENCOUNTER — Encounter (HOSPITAL_COMMUNITY)
Admission: RE | Admit: 2017-10-18 | Discharge: 2017-10-18 | Disposition: A | Discharge status: Home / Self Care | Payer: Medicare Other | Source: Ambulatory Visit | Attending: Internal Medicine | Admitting: Internal Medicine

## 2017-10-18 ENCOUNTER — Encounter (HOSPITAL_COMMUNITY): Payer: Medicare Other

## 2017-10-18 DIAGNOSIS — I213 ST elevation (STEMI) myocardial infarction of unspecified site: Secondary | ICD-10-CM

## 2017-10-18 DIAGNOSIS — K219 Gastro-esophageal reflux disease without esophagitis: Secondary | ICD-10-CM | POA: Diagnosis not present

## 2017-10-18 DIAGNOSIS — I10 Essential (primary) hypertension: Secondary | ICD-10-CM | POA: Diagnosis not present

## 2017-10-18 DIAGNOSIS — Z7902 Long term (current) use of antithrombotics/antiplatelets: Secondary | ICD-10-CM | POA: Diagnosis not present

## 2017-10-18 DIAGNOSIS — Z955 Presence of coronary angioplasty implant and graft: Secondary | ICD-10-CM | POA: Diagnosis not present

## 2017-10-18 DIAGNOSIS — Z7901 Long term (current) use of anticoagulants: Secondary | ICD-10-CM | POA: Diagnosis not present

## 2017-10-18 DIAGNOSIS — Z79899 Other long term (current) drug therapy: Secondary | ICD-10-CM | POA: Diagnosis not present

## 2017-10-21 ENCOUNTER — Encounter (HOSPITAL_COMMUNITY): Payer: Medicare Other

## 2017-10-21 ENCOUNTER — Encounter (HOSPITAL_COMMUNITY)
Admission: RE | Admit: 2017-10-21 | Discharge: 2017-10-21 | Disposition: A | Discharge status: Home / Self Care | Payer: Medicare Other | Source: Ambulatory Visit | Attending: Internal Medicine | Admitting: Internal Medicine

## 2017-10-21 DIAGNOSIS — Z7901 Long term (current) use of anticoagulants: Secondary | ICD-10-CM | POA: Diagnosis not present

## 2017-10-21 DIAGNOSIS — K219 Gastro-esophageal reflux disease without esophagitis: Secondary | ICD-10-CM | POA: Diagnosis not present

## 2017-10-21 DIAGNOSIS — Z79899 Other long term (current) drug therapy: Secondary | ICD-10-CM | POA: Diagnosis not present

## 2017-10-21 DIAGNOSIS — I10 Essential (primary) hypertension: Secondary | ICD-10-CM | POA: Diagnosis not present

## 2017-10-21 DIAGNOSIS — Z7902 Long term (current) use of antithrombotics/antiplatelets: Secondary | ICD-10-CM | POA: Diagnosis not present

## 2017-10-21 DIAGNOSIS — I213 ST elevation (STEMI) myocardial infarction of unspecified site: Secondary | ICD-10-CM

## 2017-10-21 DIAGNOSIS — Z955 Presence of coronary angioplasty implant and graft: Secondary | ICD-10-CM

## 2017-10-23 ENCOUNTER — Encounter (HOSPITAL_COMMUNITY): Payer: Medicare Other

## 2017-10-23 ENCOUNTER — Encounter (HOSPITAL_COMMUNITY)
Admission: RE | Admit: 2017-10-23 | Discharge: 2017-10-23 | Disposition: A | Discharge status: Home / Self Care | Payer: Medicare Other | Source: Ambulatory Visit | Attending: Internal Medicine | Admitting: Internal Medicine

## 2017-10-23 DIAGNOSIS — Z7901 Long term (current) use of anticoagulants: Secondary | ICD-10-CM | POA: Diagnosis not present

## 2017-10-23 DIAGNOSIS — I10 Essential (primary) hypertension: Secondary | ICD-10-CM | POA: Diagnosis not present

## 2017-10-23 DIAGNOSIS — Z955 Presence of coronary angioplasty implant and graft: Secondary | ICD-10-CM

## 2017-10-23 DIAGNOSIS — Z7902 Long term (current) use of antithrombotics/antiplatelets: Secondary | ICD-10-CM | POA: Diagnosis not present

## 2017-10-23 DIAGNOSIS — Z79899 Other long term (current) drug therapy: Secondary | ICD-10-CM | POA: Diagnosis not present

## 2017-10-23 DIAGNOSIS — K219 Gastro-esophageal reflux disease without esophagitis: Secondary | ICD-10-CM | POA: Diagnosis not present

## 2017-10-23 DIAGNOSIS — I213 ST elevation (STEMI) myocardial infarction of unspecified site: Secondary | ICD-10-CM | POA: Diagnosis not present

## 2017-10-25 ENCOUNTER — Encounter (HOSPITAL_COMMUNITY)
Admission: RE | Admit: 2017-10-25 | Discharge: 2017-10-25 | Disposition: A | Discharge status: Home / Self Care | Payer: Medicare Other | Source: Ambulatory Visit | Attending: Internal Medicine | Admitting: Internal Medicine

## 2017-10-25 ENCOUNTER — Encounter (HOSPITAL_COMMUNITY): Payer: Medicare Other

## 2017-10-25 DIAGNOSIS — I213 ST elevation (STEMI) myocardial infarction of unspecified site: Secondary | ICD-10-CM

## 2017-10-25 DIAGNOSIS — K219 Gastro-esophageal reflux disease without esophagitis: Secondary | ICD-10-CM | POA: Diagnosis not present

## 2017-10-25 DIAGNOSIS — Z7901 Long term (current) use of anticoagulants: Secondary | ICD-10-CM | POA: Diagnosis not present

## 2017-10-25 DIAGNOSIS — Z955 Presence of coronary angioplasty implant and graft: Secondary | ICD-10-CM

## 2017-10-25 DIAGNOSIS — I10 Essential (primary) hypertension: Secondary | ICD-10-CM | POA: Diagnosis not present

## 2017-10-25 DIAGNOSIS — Z7902 Long term (current) use of antithrombotics/antiplatelets: Secondary | ICD-10-CM | POA: Diagnosis not present

## 2017-10-25 DIAGNOSIS — Z79899 Other long term (current) drug therapy: Secondary | ICD-10-CM | POA: Diagnosis not present

## 2017-10-30 ENCOUNTER — Encounter (HOSPITAL_COMMUNITY): Payer: Medicare Other

## 2017-10-30 ENCOUNTER — Encounter (HOSPITAL_COMMUNITY)
Admission: RE | Admit: 2017-10-30 | Discharge: 2017-10-30 | Disposition: A | Discharge status: Home / Self Care | Payer: Medicare Other | Source: Ambulatory Visit | Attending: Internal Medicine | Admitting: Internal Medicine

## 2017-10-30 DIAGNOSIS — Z7902 Long term (current) use of antithrombotics/antiplatelets: Secondary | ICD-10-CM | POA: Diagnosis not present

## 2017-10-30 DIAGNOSIS — I213 ST elevation (STEMI) myocardial infarction of unspecified site: Secondary | ICD-10-CM

## 2017-10-30 DIAGNOSIS — I10 Essential (primary) hypertension: Secondary | ICD-10-CM | POA: Diagnosis not present

## 2017-10-30 DIAGNOSIS — Z7901 Long term (current) use of anticoagulants: Secondary | ICD-10-CM | POA: Diagnosis not present

## 2017-10-30 DIAGNOSIS — Z955 Presence of coronary angioplasty implant and graft: Secondary | ICD-10-CM

## 2017-10-30 DIAGNOSIS — K219 Gastro-esophageal reflux disease without esophagitis: Secondary | ICD-10-CM | POA: Diagnosis not present

## 2017-10-30 DIAGNOSIS — Z79899 Other long term (current) drug therapy: Secondary | ICD-10-CM | POA: Diagnosis not present

## 2017-10-30 NOTE — Progress Notes (Signed)
Advanced Heart Failure Clinic Note  Primary HF Cardiologist: Dr Sharlotte Alamo  HPI:  [name] is a 83 y.o. female with h/o HTN, osteoporosis, L breast cancer s/p XRT and chemo and definitive surgery, HLD, and pre diabetes.   Admitted to [facility]  ED 07/27/17 with CP. Hospital course was complicated by acute respiratory failure, bradycardic arrest, Afib RVR, and cardiogenic shock in the setting of anterior MI.  Found to have acute anterior MI and taken urgently to the cath lab. [place] 07/27/17 as below with 3v CAD, LVEF 35-40%, elevated LVEDP. Underwent PCI with successful stenting of the ramus intermediate artery with DES x 2. Had bradycardic arrest on arrival to ICU with epi/atropine and brief CPR and intubation. As she improved she was extubated on 2/24. An ECHO was completed an showed LVEF 40-45%.     Diuresed with IV lasix and once euvolemic transitioned to po lasix.  HF medications optimized. Renal function was followed closely. She had several episodes of bradycardia so beta blocker was stopped. Later developed A fib RVR so amiodarone was loaded and she was started on eliquis. Chemically converted to NSR but had some break through A fib. Plan to continue amio 400 mg twice a day for 7 days then cut back to 200 mg twice a day. Continue eliquis 2.5 mg twice a day ( reduced dose for age and creatinine). Discharge 167 pounds.   She presents today for regular follow up. Feeling OK overall. She started Cardiac rehab earlier this month. Working slowly. She is using a walker, so doesn't feel like she is making as much progress as she expected. Denies CP or orthopnea. No SOB or palpitations. She is having occasional hematuria. Following with her PCP and referred to urology. She denies BRBPR. She is taking all medications as directed. Has not needed any extra lasix. Weight down 11 lbs from last visit. She has a decreased appetite, along with occasional nausea and distorted taste.   ECHO 07/27/2017 EF 40-45%     LHC 07/27/2017   Post Atrio lesion is 90% stenosed.  Prox LAD to Dist LAD lesion is 50% stenosed.  Ost 1st Diag lesion is 95% stenosed.  Ost 2nd Diag to 2nd Diag lesion is 90% stenosed.  Mid Cx lesion is 30% stenosed.  Ost Ramus to Ramus lesion is 100% stenosed.  A drug-eluting stent was successfully placed using a STENT SYNERGY DES 2.75X28.  A drug-eluting stent was successfully placed using a STENT SYNERGY DES 2.5X12.  Post intervention, there is a 0% residual stenosis.  There is moderate to severe left ventricular systolic dysfunction.  LV end diastolic pressure is moderately elevated.  The left ventricular ejection fraction is 35-45% by visual estimate. 1. 3 vessel obstructive CAD - 100% ramus intermediate artery. This is a large vessel and is the culprit lesion. - 95% small first diagonal - 90% small second diagonal  - diffusely diseased LAD - 90% PLOM 2. Moderate to severe LV dysfunction. EF estimated at 35-40%. 3. Elevated LVEDP 4. Successful stenting of the ramus intermediate artery with DES x 2.  Plan; DAPT for one year  Review of systems complete and found to be negative unless listed in HPI.    SH:  Social History   Socioeconomic History  . Marital status: Married    Spouse name: Not on file  . Number of children: Not on file  . Years of education: Not on file  . Highest education level: Not on file  Occupational History  . Occupation:  retired  Scientific laboratory technician  . Financial resource strain: Not on file  . Food insecurity:    Worry: Not on file    Inability: Not on file  . Transportation needs:    Medical: Not on file    Non-medical: Not on file  Tobacco Use  . Smoking status: Never Smoker  . Smokeless tobacco: Never Used  Substance and Sexual Activity  . Alcohol use: No  . Drug use: No  . Sexual activity: Not on file  Lifestyle  . Physical activity:    Days per week: Not on file    Minutes per session: Not on file  . Stress:  Not on file  Relationships  . Social connections:    Talks on phone: Not on file    Gets together: Not on file    Attends religious service: Not on file    Active member of club or organization: Not on file    Attends meetings of clubs or organizations: Not on file    Relationship status: Not on file  . Intimate partner violence:    Fear of current or ex partner: Not on file    Emotionally abused: Not on file    Physically abused: Not on file    Forced sexual activity: Not on file  Other Topics Concern  . Not on file  Social History Narrative  . Not on file    FH:  Family History  Problem Relation Age of Onset  . Pancreatic cancer Mother        pancreastic  . Stroke Father   . Hypertension Brother   . Hypertension Sister   . Hypertension Brother   . Heart disease Brother   . Hypertension Sister     Past Medical History:  Diagnosis Date  . Anxiety   . Breast cancer ([place])   . Breast cancer of lower-outer quadrant of left female breast ([place]) 10/11/2014  . GERD (gastroesophageal reflux disease)   . Hypertension   . Personal history of radiation therapy    2016  . PONV (postoperative nausea and vomiting)   . PUD (peptic ulcer disease)   . Stone, kidney     Current Outpatient Medications  Medication Sig Dispense Refill  . amiodarone (PACERONE) 200 MG tablet Take 1 tablet (200 mg total) by mouth daily. 90 tablet 3  . anastrozole (ARIMIDEX) 1 MG tablet TAKE ONE TABLET BY MOUTH ONCE DAILY 90 tablet 4  . apixaban (ELIQUIS) 2.5 MG TABS tablet Take 1 tablet (2.5 mg total) by mouth 2 (two) times daily. 60 tablet 6  . atorvastatin (LIPITOR) 80 MG tablet Take 1 tablet (80 mg total) by mouth daily at 6 PM. 30 tablet 6  . Calcium Carb-Cholecalciferol (CALCIUM + D3) 600-800 MG-UNIT TABS Take 1 tablet by mouth daily.     . cholecalciferol (VITAMIN D) 1000 UNITS tablet Take 1,000 Units by mouth daily. Over the counter    . citalopram (CELEXA) 10 MG tablet Take 1 tablet (10 mg total)  by mouth daily. 30 tablet 6  . clopidogrel (PLAVIX) 75 MG tablet Take 1 tablet (75 mg total) by mouth daily. 30 tablet 6  . co-enzyme Q-10 30 MG capsule Take 30 mg by mouth 3 (three) times daily.    . fish oil-omega-3 fatty acids 1000 MG capsule Take 1 g by mouth daily.    . furosemide (LASIX) 20 MG tablet Take 1 tablet (20 mg total) by mouth 2 (two) times a week. Take every Monday and Thursday.  15 tablet 6  . losartan (COZAAR) 25 MG tablet Take 1 tablet (25 mg total) by mouth 2 (two) times daily. 60 tablet 6  . magnesium gluconate (MAGONATE) 500 MG tablet Take 500 mg by mouth 2 (two) times daily.    . Multiple Vitamin (MULTIVITAMIN) tablet Take 1 tablet by mouth daily.    . nitroGLYCERIN (NITROSTAT) 0.3 MG SL tablet Place 1 tablet (0.3 mg total) under the tongue every 5 (five) minutes as needed for chest pain. 90 tablet 12  . spironolactone (ALDACTONE) 25 MG tablet Take 1 tablet (25 mg total) by mouth daily. 30 tablet 6   No current facility-administered medications for this visit.    Vitals:   10/31/17 1108  BP: 138/62  Pulse: (!) 55  SpO2: 100%  Weight: 149 lb 3.2 oz (67.7 kg)     Wt Readings from Last 3 Encounters:  10/31/17 149 lb 3.2 oz (67.7 kg)  10/10/17 158 lb 15.2 oz (72.1 kg)  09/19/17 160 lb 1.9 oz (72.6 kg)    PHYSICAL EXAM:  General: Elderly appearing. No resp difficulty. HEENT: Normal Neck: Supple. JVP 5-6. Carotids 2+ bilat; no bruits. No thyromegaly or nodule noted. Cor: PMI nondisplaced. RRR, No M/G/R noted Lungs: CTAB, normal effort. Abdomen: Soft, non-tender, non-distended, no HSM. No bruits or masses. +BS  Extremities: No cyanosis, clubbing, or rash. R and LLE no edema.  Neuro: Alert & orientedx3, cranial nerves grossly intact. moves all 4 extremities w/o difficulty. Affect pleasant   ASSESSMENT & PLAN: 1. CAD - No s/s of ischemia.    - 3V CAD with STEMI 07/2017 S/P DES x2  - Continue plavix, eliquis, and high dose statin. Off ASA - Continue cardiac  rehab.  - Continue carvedilol 3.125 bid  2. Chronic Systolic Heart Failure, ICM- ECHO EF 07/2017 EF 40-45% - NYHA III symptoms - Volume status stable.   - Continue lasix 20 mg twice a week.  - Continue losartan 25 mg twice a day.  - Continue 25 mg spironolactone daily  - Continue coreg 3.125 bid - Reinforced fluid restriction to < 2 L daily, sodium restriction to less than 2000 mg daily, and the importance of daily weights.    3. PAF - Sinus brady on EKG.  - With tremors will stop amiodarone. May also be contributing to her decreased appetite/nausea. - Continue eliquis 2.5 mg BID. Denies bleeding.   4. HTN - Meds as above.   5. H.O Left Breast Cancer - No change to current plan.    6. CKD III - Creatinine 1.5-1.7. BMET today.   Shirley Friar, PA-C  10:40 AM   Greater than 50% of the 25 minute visit was spent in counseling/coordination of care regarding disease state education, salt/fluid restriction, sliding scale diuretics, and medication compliance.

## 2017-10-31 ENCOUNTER — Ambulatory Visit (HOSPITAL_COMMUNITY)
Admission: RE | Admit: 2017-10-31 | Discharge: 2017-10-31 | Disposition: A | Discharge status: Home / Self Care | Payer: Medicare Other | Source: Ambulatory Visit | Attending: Cardiology | Admitting: Cardiology

## 2017-10-31 VITALS — BP 138/62 | HR 55 | Wt 149.2 lb

## 2017-10-31 DIAGNOSIS — Z853 Personal history of malignant neoplasm of breast: Secondary | ICD-10-CM | POA: Diagnosis not present

## 2017-10-31 DIAGNOSIS — N183 Chronic kidney disease, stage 3 (moderate): Secondary | ICD-10-CM | POA: Insufficient documentation

## 2017-10-31 DIAGNOSIS — Z79811 Long term (current) use of aromatase inhibitors: Secondary | ICD-10-CM | POA: Insufficient documentation

## 2017-10-31 DIAGNOSIS — Z9221 Personal history of antineoplastic chemotherapy: Secondary | ICD-10-CM | POA: Diagnosis not present

## 2017-10-31 DIAGNOSIS — Z79899 Other long term (current) drug therapy: Secondary | ICD-10-CM | POA: Diagnosis not present

## 2017-10-31 DIAGNOSIS — Z7902 Long term (current) use of antithrombotics/antiplatelets: Secondary | ICD-10-CM | POA: Diagnosis not present

## 2017-10-31 DIAGNOSIS — M810 Age-related osteoporosis without current pathological fracture: Secondary | ICD-10-CM | POA: Insufficient documentation

## 2017-10-31 DIAGNOSIS — Z923 Personal history of irradiation: Secondary | ICD-10-CM | POA: Diagnosis not present

## 2017-10-31 DIAGNOSIS — I252 Old myocardial infarction: Secondary | ICD-10-CM | POA: Insufficient documentation

## 2017-10-31 DIAGNOSIS — K219 Gastro-esophageal reflux disease without esophagitis: Secondary | ICD-10-CM | POA: Insufficient documentation

## 2017-10-31 DIAGNOSIS — I2510 Atherosclerotic heart disease of native coronary artery without angina pectoris: Secondary | ICD-10-CM | POA: Insufficient documentation

## 2017-10-31 DIAGNOSIS — I5022 Chronic systolic (congestive) heart failure: Secondary | ICD-10-CM | POA: Diagnosis not present

## 2017-10-31 DIAGNOSIS — I130 Hypertensive heart and chronic kidney disease with heart failure and stage 1 through stage 4 chronic kidney disease, or unspecified chronic kidney disease: Secondary | ICD-10-CM | POA: Diagnosis not present

## 2017-10-31 DIAGNOSIS — F419 Anxiety disorder, unspecified: Secondary | ICD-10-CM | POA: Insufficient documentation

## 2017-10-31 DIAGNOSIS — E785 Hyperlipidemia, unspecified: Secondary | ICD-10-CM | POA: Diagnosis not present

## 2017-10-31 DIAGNOSIS — Z7901 Long term (current) use of anticoagulants: Secondary | ICD-10-CM | POA: Diagnosis not present

## 2017-10-31 DIAGNOSIS — Z8249 Family history of ischemic heart disease and other diseases of the circulatory system: Secondary | ICD-10-CM | POA: Insufficient documentation

## 2017-10-31 DIAGNOSIS — Z955 Presence of coronary angioplasty implant and graft: Secondary | ICD-10-CM | POA: Diagnosis not present

## 2017-10-31 DIAGNOSIS — Z800 Family history of malignant neoplasm of digestive organs: Secondary | ICD-10-CM | POA: Diagnosis not present

## 2017-10-31 DIAGNOSIS — Z8711 Personal history of peptic ulcer disease: Secondary | ICD-10-CM | POA: Insufficient documentation

## 2017-10-31 DIAGNOSIS — I480 Paroxysmal atrial fibrillation: Secondary | ICD-10-CM | POA: Insufficient documentation

## 2017-10-31 DIAGNOSIS — Z87442 Personal history of urinary calculi: Secondary | ICD-10-CM | POA: Diagnosis not present

## 2017-10-31 DIAGNOSIS — Z823 Family history of stroke: Secondary | ICD-10-CM | POA: Insufficient documentation

## 2017-10-31 LAB — BASIC METABOLIC PANEL
Anion gap: 11 (ref 5–15)
BUN: 22 mg/dL — AB (ref 6–20)
CO2: 23 mmol/L (ref 22–32)
CREATININE: 1.91 mg/dL — AB (ref 0.44–1.00)
Calcium: 11.2 mg/dL — ABNORMAL HIGH (ref 8.9–10.3)
Chloride: 106 mmol/L (ref 101–111)
GFR calc Af Amer: 27 mL/min — ABNORMAL LOW (ref >60)
GFR, EST NON AFRICAN AMERICAN: 24 mL/min — AB (ref >60)
Glucose, Bld: 96 mg/dL (ref 65–99)
POTASSIUM: 4.6 mmol/L (ref 3.5–5.1)
SODIUM: 140 mmol/L (ref 135–145)

## 2017-10-31 LAB — CBC
HEMATOCRIT: 31.2 % — AB (ref 36.0–46.0)
Hemoglobin: 9.7 g/dL — ABNORMAL LOW (ref 12.0–15.0)
MCH: 29.6 pg (ref 26.0–34.0)
MCHC: 31.1 g/dL (ref 30.0–36.0)
MCV: 95.1 fL (ref 78.0–100.0)
Platelets: 212 10*3/uL (ref 150–400)
RBC: 3.28 MIL/uL — ABNORMAL LOW (ref 3.87–5.11)
RDW: 14.7 % (ref 11.5–15.5)
WBC: 4.8 10*3/uL (ref 4.0–10.5)

## 2017-10-31 NOTE — Patient Instructions (Addendum)
Routine lab work today. Will notify you of abnormal results, otherwise no news is good news!  STOP Amiodarone   Follow up as scheduled.  Take all medication as prescribed the day of your appointment. Bring all medications with you to your appointment.  Do the following things EVERYDAY: 1) Weigh yourself in the morning before breakfast. Write it down and keep it in a log. 2) Take your medicines as prescribed 3) Eat low salt foods-Limit salt (sodium) to 2000 mg per day.  4) Stay as active as you can everyday 5) Limit all fluids for the day to less than 2 liters

## 2017-11-01 ENCOUNTER — Encounter (HOSPITAL_COMMUNITY): Payer: Medicare Other

## 2017-11-01 ENCOUNTER — Encounter (HOSPITAL_COMMUNITY)
Admission: RE | Admit: 2017-11-01 | Discharge: 2017-11-01 | Disposition: A | Discharge status: Home / Self Care | Payer: Medicare Other | Source: Ambulatory Visit | Attending: Internal Medicine | Admitting: Internal Medicine

## 2017-11-01 ENCOUNTER — Telehealth (HOSPITAL_COMMUNITY): Payer: Self-pay | Admitting: Cardiology

## 2017-11-01 DIAGNOSIS — Z955 Presence of coronary angioplasty implant and graft: Secondary | ICD-10-CM | POA: Diagnosis not present

## 2017-11-01 DIAGNOSIS — I213 ST elevation (STEMI) myocardial infarction of unspecified site: Secondary | ICD-10-CM | POA: Diagnosis not present

## 2017-11-01 DIAGNOSIS — I10 Essential (primary) hypertension: Secondary | ICD-10-CM | POA: Diagnosis not present

## 2017-11-01 DIAGNOSIS — Z7901 Long term (current) use of anticoagulants: Secondary | ICD-10-CM | POA: Diagnosis not present

## 2017-11-01 DIAGNOSIS — Z7902 Long term (current) use of antithrombotics/antiplatelets: Secondary | ICD-10-CM | POA: Diagnosis not present

## 2017-11-01 DIAGNOSIS — I5022 Chronic systolic (congestive) heart failure: Secondary | ICD-10-CM

## 2017-11-01 DIAGNOSIS — Z79899 Other long term (current) drug therapy: Secondary | ICD-10-CM | POA: Diagnosis not present

## 2017-11-01 DIAGNOSIS — K219 Gastro-esophageal reflux disease without esophagitis: Secondary | ICD-10-CM | POA: Diagnosis not present

## 2017-11-01 NOTE — Telephone Encounter (Signed)
-----   Message from Shirley Friar, PA-C sent at 11/01/2017  7:45 AM EDT ----- Please call today.    Legrand Como [address] [place], PA-C 11/01/2017 7:45 AM

## 2017-11-01 NOTE — Telephone Encounter (Signed)
Notes recorded by Kerry Dory, CMA on 11/01/2017 at 10:07 AM EDT Patient aware. Patient voiced understanding repeat labs 11/15/17   ------  Notes recorded by Shirley Friar, PA-C on 11/01/2017 at 7:45 AM EDT Please call today.    Legrand Como [address] [place], PA-C 11/01/2017 7:45 AM ------  Notes recorded by Shirley Friar, PA-C on 10/31/2017 at 12:50 PM EDT Not far from baseline. Please have her hold 1 dose lasix (next Monday) and recheck BMET 2 weeks.    Legrand Como [address]" [place], PA-C 10/31/2017 12:50 PM

## 2017-11-04 ENCOUNTER — Encounter (HOSPITAL_COMMUNITY)
Admission: RE | Admit: 2017-11-04 | Discharge: 2017-11-04 | Disposition: A | Discharge status: Home / Self Care | Payer: Medicare Other | Source: Ambulatory Visit | Attending: Internal Medicine | Admitting: Internal Medicine

## 2017-11-04 ENCOUNTER — Encounter (HOSPITAL_COMMUNITY): Payer: Medicare Other

## 2017-11-04 DIAGNOSIS — Z7901 Long term (current) use of anticoagulants: Secondary | ICD-10-CM | POA: Insufficient documentation

## 2017-11-04 DIAGNOSIS — I213 ST elevation (STEMI) myocardial infarction of unspecified site: Secondary | ICD-10-CM | POA: Diagnosis not present

## 2017-11-04 DIAGNOSIS — K219 Gastro-esophageal reflux disease without esophagitis: Secondary | ICD-10-CM | POA: Insufficient documentation

## 2017-11-04 DIAGNOSIS — I10 Essential (primary) hypertension: Secondary | ICD-10-CM | POA: Insufficient documentation

## 2017-11-04 DIAGNOSIS — Z7902 Long term (current) use of antithrombotics/antiplatelets: Secondary | ICD-10-CM | POA: Insufficient documentation

## 2017-11-04 DIAGNOSIS — Z955 Presence of coronary angioplasty implant and graft: Secondary | ICD-10-CM | POA: Insufficient documentation

## 2017-11-04 DIAGNOSIS — Z79899 Other long term (current) drug therapy: Secondary | ICD-10-CM | POA: Insufficient documentation

## 2017-11-04 NOTE — Progress Notes (Signed)
Reviewed home exercise guidelines with patient including endpoints, temperature precautions, target heart rate and rate of perceived exertion. Pt plans to walk as his mode of home exercise. Patient will walk 10 minutes, 3x's/ day at least 1 additional day/week. Pt voices understanding of instructions given. Sol Passer, MS, ACSM CEP

## 2017-11-06 ENCOUNTER — Encounter (HOSPITAL_COMMUNITY): Payer: Medicare Other

## 2017-11-06 ENCOUNTER — Encounter (HOSPITAL_COMMUNITY)
Admission: RE | Admit: 2017-11-06 | Discharge: 2017-11-06 | Disposition: A | Discharge status: Home / Self Care | Payer: Medicare Other | Source: Ambulatory Visit | Attending: Internal Medicine | Admitting: Internal Medicine

## 2017-11-06 DIAGNOSIS — I213 ST elevation (STEMI) myocardial infarction of unspecified site: Secondary | ICD-10-CM | POA: Diagnosis not present

## 2017-11-06 DIAGNOSIS — Z955 Presence of coronary angioplasty implant and graft: Secondary | ICD-10-CM

## 2017-11-06 DIAGNOSIS — K219 Gastro-esophageal reflux disease without esophagitis: Secondary | ICD-10-CM | POA: Diagnosis not present

## 2017-11-06 DIAGNOSIS — Z7902 Long term (current) use of antithrombotics/antiplatelets: Secondary | ICD-10-CM | POA: Diagnosis not present

## 2017-11-06 DIAGNOSIS — Z79899 Other long term (current) drug therapy: Secondary | ICD-10-CM | POA: Diagnosis not present

## 2017-11-06 DIAGNOSIS — Z7901 Long term (current) use of anticoagulants: Secondary | ICD-10-CM | POA: Diagnosis not present

## 2017-11-06 DIAGNOSIS — I10 Essential (primary) hypertension: Secondary | ICD-10-CM | POA: Diagnosis not present

## 2017-11-08 ENCOUNTER — Encounter (HOSPITAL_COMMUNITY): Payer: Medicare Other

## 2017-11-08 ENCOUNTER — Encounter (HOSPITAL_COMMUNITY)
Admission: RE | Admit: 2017-11-08 | Discharge: 2017-11-08 | Disposition: A | Discharge status: Home / Self Care | Payer: Medicare Other | Source: Ambulatory Visit | Attending: Internal Medicine | Admitting: Internal Medicine

## 2017-11-08 DIAGNOSIS — Z7901 Long term (current) use of anticoagulants: Secondary | ICD-10-CM | POA: Diagnosis not present

## 2017-11-08 DIAGNOSIS — K219 Gastro-esophageal reflux disease without esophagitis: Secondary | ICD-10-CM | POA: Diagnosis not present

## 2017-11-08 DIAGNOSIS — Z955 Presence of coronary angioplasty implant and graft: Secondary | ICD-10-CM

## 2017-11-08 DIAGNOSIS — Z7902 Long term (current) use of antithrombotics/antiplatelets: Secondary | ICD-10-CM | POA: Diagnosis not present

## 2017-11-08 DIAGNOSIS — I213 ST elevation (STEMI) myocardial infarction of unspecified site: Secondary | ICD-10-CM

## 2017-11-08 DIAGNOSIS — Z79899 Other long term (current) drug therapy: Secondary | ICD-10-CM | POA: Diagnosis not present

## 2017-11-08 DIAGNOSIS — I10 Essential (primary) hypertension: Secondary | ICD-10-CM | POA: Diagnosis not present

## 2017-11-11 ENCOUNTER — Encounter (HOSPITAL_COMMUNITY): Payer: Medicare Other

## 2017-11-13 ENCOUNTER — Encounter (HOSPITAL_COMMUNITY): Payer: Medicare Other

## 2017-11-13 ENCOUNTER — Encounter (HOSPITAL_COMMUNITY)
Admission: RE | Admit: 2017-11-13 | Discharge: 2017-11-13 | Disposition: A | Discharge status: Home / Self Care | Payer: Medicare Other | Source: Ambulatory Visit | Attending: Internal Medicine | Admitting: Internal Medicine

## 2017-11-13 ENCOUNTER — Encounter: Payer: Self-pay | Admitting: Internal Medicine

## 2017-11-13 DIAGNOSIS — K219 Gastro-esophageal reflux disease without esophagitis: Secondary | ICD-10-CM | POA: Diagnosis not present

## 2017-11-13 DIAGNOSIS — Z7902 Long term (current) use of antithrombotics/antiplatelets: Secondary | ICD-10-CM | POA: Diagnosis not present

## 2017-11-13 DIAGNOSIS — I213 ST elevation (STEMI) myocardial infarction of unspecified site: Secondary | ICD-10-CM | POA: Diagnosis not present

## 2017-11-13 DIAGNOSIS — Z955 Presence of coronary angioplasty implant and graft: Secondary | ICD-10-CM

## 2017-11-13 DIAGNOSIS — Z7901 Long term (current) use of anticoagulants: Secondary | ICD-10-CM | POA: Diagnosis not present

## 2017-11-13 DIAGNOSIS — I10 Essential (primary) hypertension: Secondary | ICD-10-CM | POA: Diagnosis not present

## 2017-11-13 DIAGNOSIS — Z79899 Other long term (current) drug therapy: Secondary | ICD-10-CM | POA: Diagnosis not present

## 2017-11-13 NOTE — Progress Notes (Signed)
[name] 83 y.o. female DOB: 13-Jan-1935 MRN: [id number]      Nutrition Note  1. Stented coronary artery   2. ST elevation myocardial infarction (STEMI), unspecified artery (HCC)     HT: Ht Readings from Last 1 Encounters:  10/10/17 5\' 8"  (1.727 m)    WT: Wt Readings from Last 5 Encounters:  10/31/17 149 lb 3.2 oz (67.7 kg)  10/10/17 158 lb 15.2 oz (72.1 kg)  09/19/17 160 lb 1.9 oz (72.6 kg)  08/29/17 163 lb 12.8 oz (74.3 kg)  08/15/17 166 lb 3.2 oz (75.4 kg)     BMI  24.3   Nutrition Note Spoke with pt. Nutrition plan and goals reviewed with pt. Pt is following a heart healthy diet. Pt reports her appetite remains poor. Pt again encouraged to discuss taste changes with her MD and pharmacist. Pt is drinking 1-2 Boost/day. Pt educated re: ways to maximize nutrition intake while appetite decreased. Pt has maintained her wt since starting rehab. Pt expressed understanding of the information reviewed. Pt aware of nutrition education classes offered.  Nutrition Diagnosis ? Food-and nutrition-related knowledge deficit related to lack of exposure to information as related to diagnosis of: ? CVD ? Pre-DM ? Overweight related to excessive energy intake as evidenced by a BMI 24.3  Nutrition Intervention ? Pt's individual nutrition plan reviewed with pt. ? Pt encouraged to try Boost Plus instead of Boost ? Pt given handouts for: ? High Calorie, High Protein Nutrition; Suggestions for increasing kcal and protein, and High Calorie, High Protein Recipes ? Continue client-centered nutrition education by RD, as part of interdisciplinary care.   Nutrition Goal(s):  ? Pt to liberalize her diet until her appetite improves.  ? Pt to identify food quantities necessary to achieve weight maintenance of ~160 lb at graduation from cardiac rehab.   Plan:  Pt to attend nutrition classes ? Nutrition I ? Nutrition II ? Portion Distortion  Will provide client-centered nutrition education as part of  interdisciplinary care.   Monitor and evaluate progress toward nutrition goal with team.  Derek Mound, M.Ed, RD, LDN, CDE 11/13/2017 5:03 PM

## 2017-11-14 NOTE — Progress Notes (Signed)
Cardiac Individual Treatment Plan  Patient Details  Name: [name] MRN: [id number] Date of Birth: 27-Oct-1934 Referring Provider:     CARDIAC REHAB PHASE II ORIENTATION from 10/10/2017 in [facility]  Referring Provider  Bensimhon, Daniel      Initial Encounter Date:    CARDIAC REHAB PHASE II ORIENTATION from 10/10/2017 in [facility]  Date  10/10/17  Referring Provider  Bensimhon, Quillian Quince      Visit Diagnosis: Stented coronary artery  ST elevation myocardial infarction (STEMI), unspecified artery ([place])  Patient's Home Medications on Admission:  Current Outpatient Medications:  .  anastrozole (ARIMIDEX) 1 MG tablet, TAKE ONE TABLET BY MOUTH ONCE DAILY, Disp: 90 tablet, Rfl: 4 .  apixaban (ELIQUIS) 2.5 MG TABS tablet, Take 1 tablet (2.5 mg total) by mouth 2 (two) times daily., Disp: 60 tablet, Rfl: 6 .  atorvastatin (LIPITOR) 80 MG tablet, Take 1 tablet (80 mg total) by mouth daily at 6 PM., Disp: 30 tablet, Rfl: 6 .  Calcium Carb-Cholecalciferol (CALCIUM + D3) 600-800 MG-UNIT TABS, Take 1 tablet by mouth daily. , Disp: , Rfl:  .  cholecalciferol (VITAMIN D) 1000 UNITS tablet, Take 1,000 Units by mouth daily. Over the counter, Disp: , Rfl:  .  citalopram (CELEXA) 10 MG tablet, Take 1 tablet (10 mg total) by mouth daily., Disp: 30 tablet, Rfl: 6 .  clopidogrel (PLAVIX) 75 MG tablet, Take 1 tablet (75 mg total) by mouth daily., Disp: 30 tablet, Rfl: 6 .  co-enzyme Q-10 30 MG capsule, Take 30 mg by mouth 3 (three) times daily., Disp: , Rfl:  .  fish oil-omega-3 fatty acids 1000 MG capsule, Take 1 g by mouth daily., Disp: , Rfl:  .  furosemide (LASIX) 20 MG tablet, Take 1 tablet (20 mg total) by mouth 2 (two) times a week. Take every Monday and Thursday., Disp: 15 tablet, Rfl: 6 .  losartan (COZAAR) 25 MG tablet, Take 1 tablet (25 mg total) by mouth 2 (two) times daily., Disp: 60 tablet, Rfl: 6 .  magnesium gluconate  (MAGONATE) 500 MG tablet, Take 500 mg by mouth 2 (two) times daily., Disp: , Rfl:  .  Multiple Vitamin (MULTIVITAMIN) tablet, Take 1 tablet by mouth daily., Disp: , Rfl:  .  nitroGLYCERIN (NITROSTAT) 0.3 MG SL tablet, Place 1 tablet (0.3 mg total) under the tongue every 5 (five) minutes as needed for chest pain., Disp: 90 tablet, Rfl: 12 .  spironolactone (ALDACTONE) 25 MG tablet, Take 1 tablet (25 mg total) by mouth daily., Disp: 30 tablet, Rfl: 6  Past Medical History: Past Medical History:  Diagnosis Date  . Anxiety   . Breast cancer ([place])   . Breast cancer of lower-outer quadrant of left female breast ([place]) 10/11/2014  . GERD (gastroesophageal reflux disease)   . Hypertension   . Personal history of radiation therapy    2016  . PONV (postoperative nausea and vomiting)   . PUD (peptic ulcer disease)   . Stone, kidney     Tobacco Use: Social History   Tobacco Use  Smoking Status Never Smoker  Smokeless Tobacco Never Used    Labs: Recent Review Scientist, physiological    Labs for ITP Cardiac and Pulmonary Rehab Latest Ref Rng & Units 08/01/2017 08/02/2017 08/03/2017 08/04/2017 08/05/2017   Cholestrol 0 - 200 mg/dL - - - - -   LDLCALC 0 - 99 mg/dL - - - - -   HDL >40 mg/dL - - - - -  Trlycerides <150 mg/dL - - - - -   Hemoglobin A1c 4.8 - 5.6 % - - - - -   PHART 7.350 - 7.450 - - - - -   PCO2ART 32.0 - 48.0 mmHg - - - - -   HCO3 20.0 - 28.0 mmol/L - - - - -   TCO2 22 - 32 mmol/L - - - - -   ACIDBASEDEF 0.0 - 2.0 mmol/L - - - - -   O2SAT % 59.3 53.6 56.3 65.5 61.5      Capillary Blood Glucose: Lab Results  Component Value Date   GLUCAP 117 (H) 08/05/2017   GLUCAP 122 (H) 08/05/2017   GLUCAP 103 (H) 08/05/2017   GLUCAP 109 (H) 08/05/2017   GLUCAP 147 (H) 08/04/2017     Exercise Target Goals:    Exercise Program Goal: Individual exercise prescription set using results from initial 6 min walk test and THRR while considering  patient's activity barriers and safety.   Exercise  Prescription Goal: Initial exercise prescription builds to 30-45 minutes a day of aerobic activity, 2-3 days per week.  Home exercise guidelines will be given to patient during program as part of exercise prescription that the participant will acknowledge.  Activity Barriers & Risk Stratification: Activity Barriers & Cardiac Risk Stratification - 10/10/17 0933      Activity Barriers & Cardiac Risk Stratification   Activity Barriers  Muscular Weakness;Shortness of Breath;Deconditioning;Back Problems    Cardiac Risk Stratification  High       6 Minute Walk: 6 Minute Walk    Row Name 10/10/17 1212         6 Minute Walk   Phase  Initial     Distance  1200 feet     Walk Time  6 minutes     # of Rest Breaks  0     MPH  2.3     METS  2     RPE  13     VO2 Peak  7.1     Symptoms  Yes (comment)     Comments  leg fatigue     Resting HR  53 bpm     Resting BP  112/52     Resting Oxygen Saturation   100 %     Exercise Oxygen Saturation  during 6 min walk  98 %     Max Ex. HR  66 bpm     Max Ex. BP  122/60     2 Minute Post BP  110/60        Oxygen Initial Assessment:   Oxygen Re-Evaluation:   Oxygen Discharge (Final Oxygen Re-Evaluation):   Initial Exercise Prescription: Initial Exercise Prescription - 10/10/17 1200      Date of Initial Exercise RX and Referring Provider   Date  10/10/17    Referring Provider  Bensimhon, Daniel      Recumbant Bike   Level  1.5    RPM  60    Watts  10    Minutes  10    METs  1.7      NuStep   Level  2    SPM  60    Minutes  10    METs  1.5      Track   Laps  7    Minutes  10    METs  2.23      Prescription Details   Frequency (times per week)  3    Duration  Progress to 30 minutes of continuous aerobic without signs/symptoms of physical distress      Intensity   THRR 40-80% of Max Heartrate  56-112    Ratings of Perceived Exertion  11-13    Perceived Dyspnea  0-4      Progression   Progression  Continue to  progress workloads to maintain intensity without signs/symptoms of physical distress.      Resistance Training   Training Prescription  Yes    Weight  1lbs    Reps  10-15       Perform Capillary Blood Glucose checks as needed.  Exercise Prescription Changes: Exercise Prescription Changes    Row Name 10/16/17 1508 10/21/17 1452 11/04/17 1446         Response to Exercise   Blood Pressure (Admit)  132/72  126/68  130/70     Blood Pressure (Exercise)  138/72  126/80  122/62     Blood Pressure (Exit)  126/80  128/82  122/70     Heart Rate (Admit)  57 bpm  46 bpm  55 bpm     Heart Rate (Exercise)  76 bpm  68 bpm  89 bpm     Heart Rate (Exit)  57 bpm  55 bpm  61 bpm     Rating of Perceived Exertion (Exercise)  13  12  12      Symptoms  none  none  none     Duration  Progress to 30 minutes of  aerobic without signs/symptoms of physical distress  Progress to 30 minutes of  aerobic without signs/symptoms of physical distress  Progress to 30 minutes of  aerobic without signs/symptoms of physical distress     Intensity  THRR unchanged  THRR unchanged  THRR unchanged       Progression   Progression  Continue to progress workloads to maintain intensity without signs/symptoms of physical distress.  Continue to progress workloads to maintain intensity without signs/symptoms of physical distress.  Continue to progress workloads to maintain intensity without signs/symptoms of physical distress.     Average METs  2.1  1.8  1.9       Resistance Training   Training Prescription  No Relaxation day, no weights.  Yes  Yes     Weight  -  1lbs  1lbs     Reps  -  10-15  10-15     Time  -  10 Minutes  10 Minutes       Interval Training   Interval Training  No  No  No       Recumbant Bike   Level  1.5  1.5  1.5     Minutes  10  10  10      METs  -  1.6  1.7       NuStep   Level  2  2  2      SPM  60  60  60     Minutes  10  10  10      METs  1.9  1.9  1.8       Track   Laps  7  6  7      Minutes   10  10  10      METs  2.22  2.03  2.23       Home Exercise Plan   Plans to continue exercise at  -  -  Home (comment)     Frequency  -  -  Add 1  additional day to program exercise sessions.     Initial Home Exercises Provided  -  -  11/04/17        Exercise Comments: Exercise Comments    Row Name 10/16/17 1508 10/21/17 1452 11/04/17 1500       Exercise Comments  Patient off to a good start with exercise.  Reviewed METs with patient.  Reviewed home exercise guidelines, METs, and goals with patient.        Exercise Goals and Review: Exercise Goals    Row Name 10/10/17 0934             Exercise Goals   Increase Physical Activity  Yes       Intervention  Provide advice, education, support and counseling about physical activity/exercise needs.;Develop an individualized exercise prescription for aerobic and resistive training based on initial evaluation findings, risk stratification, comorbidities and participant's personal goals.       Expected Outcomes  Short Term: Attend rehab on a regular basis to increase amount of physical activity.;Long Term: Add in home exercise to make exercise part of routine and to increase amount of physical activity.;Long Term: Exercising regularly at least 3-5 days a week.       Increase Strength and Stamina  Yes get back to gardening, household chores, cooking and baking       Intervention  Provide advice, education, support and counseling about physical activity/exercise needs.;Develop an individualized exercise prescription for aerobic and resistive training based on initial evaluation findings, risk stratification, comorbidities and participant's personal goals.       Expected Outcomes  Short Term: Increase workloads from initial exercise prescription for resistance, speed, and METs.;Short Term: Perform resistance training exercises routinely during rehab and add in resistance training at home;Long Term: Improve cardiorespiratory fitness, muscular  endurance and strength as measured by increased METs and functional capacity (6MWT)       Able to understand and use rate of perceived exertion (RPE) scale  Yes       Intervention  Provide education and explanation on how to use RPE scale       Expected Outcomes  Short Term: Able to use RPE daily in rehab to express subjective intensity level;Long Term:  Able to use RPE to guide intensity level when exercising independently       Knowledge and understanding of Target Heart Rate Range (THRR)  Yes       Intervention  Provide education and explanation of THRR including how the numbers were predicted and where they are located for reference       Expected Outcomes  Short Term: Able to state/look up THRR;Long Term: Able to use THRR to govern intensity when exercising independently;Short Term: Able to use daily as guideline for intensity in rehab       Able to check pulse independently  Yes       Intervention  Provide education and demonstration on how to check pulse in carotid and radial arteries.;Review the importance of being able to check your own pulse for safety during independent exercise       Expected Outcomes  Short Term: Able to explain why pulse checking is important during independent exercise;Long Term: Able to check pulse independently and accurately       Understanding of Exercise Prescription  Yes       Intervention  Provide education, explanation, and written materials on patient's individual exercise prescription       Expected Outcomes  Short Term: Able to explain program  exercise prescription;Long Term: Able to explain home exercise prescription to exercise independently          Exercise Goals Re-Evaluation : Exercise Goals Re-Evaluation    Row Name 10/16/17 1648 11/04/17 1446           Exercise Goal Re-Evaluation   Exercise Goals Review  Increase Physical Activity;Able to understand and use rate of perceived exertion (RPE) scale  Understanding of Exercise  Prescription;Increase Physical Activity;Knowledge and understanding of Target Heart Rate Range (THRR)      Comments  Patient tolerated low intensity exercise without c/o. Patient understands and is able to use the RPE scale appropriately.  Reviewed home exercise guidelines with patient including THRR, RPE scale, and endpoints for exercise.       Expected Outcomes  Increase workloads as tolerated to help achieve personal health and fitness goals.  Patient will add 1 additional day walking in addition to exercise at [facility].          Discharge Exercise Prescription (Final Exercise Prescription Changes): Exercise Prescription Changes - 11/04/17 1446      Response to Exercise   Blood Pressure (Admit)  130/70    Blood Pressure (Exercise)  122/62    Blood Pressure (Exit)  122/70    Heart Rate (Admit)  55 bpm    Heart Rate (Exercise)  89 bpm    Heart Rate (Exit)  61 bpm    Rating of Perceived Exertion (Exercise)  12    Symptoms  none    Duration  Progress to 30 minutes of  aerobic without signs/symptoms of physical distress    Intensity  THRR unchanged      Progression   Progression  Continue to progress workloads to maintain intensity without signs/symptoms of physical distress.    Average METs  1.9      Resistance Training   Training Prescription  Yes    Weight  1lbs    Reps  10-15    Time  10 Minutes      Interval Training   Interval Training  No      Recumbant Bike   Level  1.5    Minutes  10    METs  1.7      NuStep   Level  2    SPM  60    Minutes  10    METs  1.8      Track   Laps  7    Minutes  10    METs  2.23      Home Exercise Plan   Plans to continue exercise at  Home (comment)    Frequency  Add 1 additional day to program exercise sessions.    Initial Home Exercises Provided  11/04/17       Nutrition:  Target Goals: Understanding of nutrition guidelines, daily intake of sodium 1500mg , cholesterol 200mg , calories 30% from fat and 7% or less from saturated  fats, daily to have 5 or more servings of fruits and vegetables.  Biometrics: Pre Biometrics - 10/10/17 1220      Pre Biometrics   Height  5\' 8"  (1.727 m)    Weight  158 lb 15.2 oz (72.1 kg)    Waist Circumference  31.5 inches    Hip Circumference  39.5 inches    Waist to Hip Ratio  0.8 %    BMI (Calculated)  24.17    Triceps Skinfold  24 mm    % Body Fat  35.5 %  Grip Strength  25 kg    Flexibility  0 in    Single Leg Stand  4.97 seconds        Nutrition Therapy Plan and Nutrition Goals: Nutrition Therapy & Goals - 10/10/17 1222      Nutrition Therapy   Diet  General, Healthful      Personal Nutrition Goals   Nutrition Goal  Pt to liberalize her diet until her appetite improves.     Personal Goal #2  Pt to identify food quantities necessary to achieve weight maintenance of ~160 lb at graduation from cardiac rehab.       Intervention Plan   Intervention  Prescribe, educate and counsel regarding individualized specific dietary modifications aiming towards targeted core components such as weight, hypertension, lipid management, diabetes, heart failure and other comorbidities.    Expected Outcomes  Short Term Goal: Understand basic principles of dietary content, such as calories, fat, sodium, cholesterol and nutrients.;Long Term Goal: Adherence to prescribed nutrition plan.       Nutrition Assessments: Nutrition Assessments - 10/10/17 1223      MEDFICTS Scores   Pre Score  21       Nutrition Goals Re-Evaluation:   Nutrition Goals Re-Evaluation:   Nutrition Goals Discharge (Final Nutrition Goals Re-Evaluation):   Psychosocial: Target Goals: Acknowledge presence or absence of significant depression and/or stress, maximize coping skills, provide positive support system. Participant is able to verbalize types and ability to use techniques and skills needed for reducing stress and depression.  Initial Review & Psychosocial Screening: Initial Psych Review &  Screening - 10/10/17 1342      Initial Review   Current issues with  None Identified      Family Dynamics   Good Support System?  Yes [name] reports her spouse, family, and friends as sources of support for her.      Barriers   Psychosocial barriers to participate in program  There are no identifiable barriers or psychosocial needs.      Screening Interventions   Interventions  Encouraged to exercise       Quality of Life Scores: Quality of Life - 10/10/17 1221      Quality of Life Scores   Health/Function Pre  24.19 %    Socioeconomic Pre  27.86 %    Psych/Spiritual Pre  27.86 %    Family Pre  30 %    GLOBAL Pre  26.7 %      Scores of 19 and below usually indicate a poorer quality of life in these areas.  A difference of  2-3 points is a clinically meaningful difference.  A difference of 2-3 points in the total score of the Quality of Life Index has been associated with significant improvement in overall quality of life, self-image, physical symptoms, and general health in studies assessing change in quality of life.  PHQ-9: Recent Review Flowsheet Data    Depression screen [facility] 2/9 10/14/2017 07/11/2017 02/28/2017 11/05/2016 07/02/2016   Decreased Interest 0 0 0 0 0   Down, Depressed, Hopeless 0 0 0 0 0   PHQ - 2 Score 0 0 0 0 0     Interpretation of Total Score  Total Score Depression Severity:  1-4 = Minimal depression, 5-9 = Mild depression, 10-14 = Moderate depression, 15-19 = Moderately severe depression, 20-27 = Severe depression   Psychosocial Evaluation and Intervention:   Psychosocial Re-Evaluation: Psychosocial Re-Evaluation    [place] Name 11/14/17 1431  Psychosocial Re-Evaluation   Current issues with  None Identified       Interventions  Encouraged to attend Cardiac Rehabilitation for the exercise       Continue Psychosocial Services   No Follow up required          Psychosocial Discharge (Final Psychosocial Re-Evaluation): Psychosocial  Re-Evaluation - 11/14/17 1431      Psychosocial Re-Evaluation   Current issues with  None Identified    Interventions  Encouraged to attend Cardiac Rehabilitation for the exercise    Continue Psychosocial Services   No Follow up required       Vocational Rehabilitation: Provide vocational rehab assistance to qualifying candidates.   Vocational Rehab Evaluation & Intervention: Vocational Rehab - 10/10/17 1340      Initial Vocational Rehab Evaluation & Intervention   Assessment shows need for Vocational Rehabilitation  No       Education: Education Goals: Education classes will be provided on a weekly basis, covering required topics. Participant will state understanding/return demonstration of topics presented.  Learning Barriers/Preferences: Learning Barriers/Preferences - 10/10/17 0933      Learning Barriers/Preferences   Learning Barriers  Sight    Learning Preferences  Skilled Demonstration;Verbal Instruction;Video;Written Material;Pictoral       Education Topics: Count Your Pulse:  -Group instruction provided by verbal instruction, demonstration, patient participation and written materials to support subject.  Instructors address importance of being able to find your pulse and how to count your pulse when at home without a heart monitor.  Patients get hands on experience counting their pulse with staff help and individually.   CARDIAC REHAB PHASE II EXERCISE from 11/13/2017 in [place]  Date  10/18/17  Educator  [name]  Instruction Review Code  2- Demonstrated Understanding      Heart Attack, Angina, and Risk Factor Modification:  -Group instruction provided by verbal instruction, video, and written materials to support subject.  Instructors address signs and symptoms of angina and heart attacks.    Also discuss risk factors for heart disease and how to make changes to improve heart health risk factors.   Functional Fitness:   -Group instruction provided by verbal instruction, demonstration, patient participation, and written materials to support subject.  Instructors address safety measures for doing things around the house.  Discuss how to get up and down off the floor, how to pick things up properly, how to safely get out of a chair without assistance, and balance training.   CARDIAC REHAB PHASE II EXERCISE from 11/13/2017 in [place]  Date  10/25/17  Educator  [place]  Instruction Review Code  2- Demonstrated Understanding      Meditation and Mindfulness:  -Group instruction provided by verbal instruction, patient participation, and written materials to support subject.  Instructor addresses importance of mindfulness and meditation practice to help reduce stress and improve awareness.  Instructor also leads participants through a meditation exercise.    Stretching for Flexibility and Mobility:  -Group instruction provided by verbal instruction, patient participation, and written materials to support subject.  Instructors lead participants through series of stretches that are designed to increase flexibility thus improving mobility.  These stretches are additional exercise for major muscle groups that are typically performed during regular warm up and cool down.   Hands Only CPR:  -Group verbal, video, and participation provides a basic overview of AHA guidelines for community CPR. Role-play of emergencies allow participants the opportunity to practice calling  for help and chest compression technique with discussion of AED use.   Hypertension: -Group verbal and written instruction that provides a basic overview of hypertension including the most recent diagnostic guidelines, risk factor reduction with self-care instructions and medication management.    Nutrition I class: Heart Healthy Eating:  -Group instruction provided by PowerPoint slides, verbal discussion, and  written materials to support subject matter. The instructor gives an explanation and review of the Therapeutic Lifestyle Changes diet recommendations, which includes a discussion on lipid goals, dietary fat, sodium, fiber, plant stanol/sterol esters, sugar, and the components of a well-balanced, healthy diet.   Nutrition II class: Lifestyle Skills:  -Group instruction provided by PowerPoint slides, verbal discussion, and written materials to support subject matter. The instructor gives an explanation and review of label reading, grocery shopping for heart health, heart healthy recipe modifications, and ways to make healthier choices when eating out.   Diabetes Question & Answer:  -Group instruction provided by PowerPoint slides, verbal discussion, and written materials to support subject matter. The instructor gives an explanation and review of diabetes co-morbidities, pre- and post-prandial blood glucose goals, pre-exercise blood glucose goals, signs, symptoms, and treatment of hypoglycemia and hyperglycemia, and foot care basics.   Diabetes Blitz:  -Group instruction provided by PowerPoint slides, verbal discussion, and written materials to support subject matter. The instructor gives an explanation and review of the physiology behind type 1 and type 2 diabetes, diabetes medications and rational behind using different medications, pre- and post-prandial blood glucose recommendations and Hemoglobin A1c goals, diabetes diet, and exercise including blood glucose guidelines for exercising safely.    Portion Distortion:  -Group instruction provided by PowerPoint slides, verbal discussion, written materials, and food models to support subject matter. The instructor gives an explanation of serving size versus portion size, changes in portions sizes over the last 20 years, and what consists of a serving from each food group.   CARDIAC REHAB PHASE II EXERCISE from 11/13/2017 in [place]  Date  10/23/17  Educator  RD  Instruction Review Code  2- Demonstrated Understanding      Stress Management:  -Group instruction provided by verbal instruction, video, and written materials to support subject matter.  Instructors review role of stress in heart disease and how to cope with stress positively.     CARDIAC REHAB PHASE II EXERCISE from 11/13/2017 in [place]  Date  10/30/17  Educator  Jiles Garter, RN  Instruction Review Code  2- Demonstrated Understanding      Exercising on Your Own:  -Group instruction provided by verbal instruction, power point, and written materials to support subject.  Instructors discuss benefits of exercise, components of exercise, frequency and intensity of exercise, and end points for exercise.  Also discuss use of nitroglycerin and activating EMS.  Review options of places to exercise outside of rehab.  Review guidelines for sex with heart disease.   Cardiac Drugs I:  -Group instruction provided by verbal instruction and written materials to support subject.  Instructor reviews cardiac drug classes: antiplatelets, anticoagulants, beta blockers, and statins.  Instructor discusses reasons, side effects, and lifestyle considerations for each drug class.   Cardiac Drugs II:  -Group instruction provided by verbal instruction and written materials to support subject.  Instructor reviews cardiac drug classes: angiotensin converting enzyme inhibitors (ACE-I), angiotensin II receptor blockers (ARBs), nitrates, and calcium channel blockers.  Instructor discusses reasons, side effects, and lifestyle considerations for each drug class.  CARDIAC REHAB PHASE II EXERCISE from 11/13/2017 in [place]  Date  11/13/17  Educator  Pharmacist  Instruction Review Code  2- Demonstrated Understanding      Anatomy and Physiology of the Circulatory System:  Group verbal and written  instruction and models provide basic cardiac anatomy and physiology, with the coronary electrical and arterial systems. Review of: AMI, Angina, Valve disease, Heart Failure, Peripheral Artery Disease, Cardiac Arrhythmia, Pacemakers, and the ICD.   Other Education:  -Group or individual verbal, written, or video instructions that support the educational goals of the cardiac rehab program.   Holiday Eating Survival Tips:  -Group instruction provided by PowerPoint slides, verbal discussion, and written materials to support subject matter. The instructor gives patients tips, tricks, and techniques to help them not only survive but enjoy the holidays despite the onslaught of food that accompanies the holidays.   Knowledge Questionnaire Score: Knowledge Questionnaire Score - 10/10/17 1210      Knowledge Questionnaire Score   Pre Score  17/24       Core Components/Risk Factors/Patient Goals at Admission: Personal Goals and Risk Factors at Admission - 10/10/17 1221      Core Components/Risk Factors/Patient Goals on Admission   Heart Failure  Yes    Intervention  Provide a combined exercise and nutrition program that is supplemented with education, support and counseling about heart failure. Directed toward relieving symptoms such as shortness of breath, decreased exercise tolerance, and extremity edema.    Expected Outcomes  Improve functional capacity of life;Short term: Attendance in program 2-3 days a week with increased exercise capacity. Reported lower sodium intake. Reported increased fruit and vegetable intake. Reports medication compliance.;Short term: Daily weights obtained and reported for increase. Utilizing diuretic protocols set by physician.;Long term: Adoption of self-care skills and reduction of barriers for early signs and symptoms recognition and intervention leading to self-care maintenance.    Hypertension  Yes    Intervention  Provide education on lifestyle modifcations  including regular physical activity/exercise, weight management, moderate sodium restriction and increased consumption of fresh fruit, vegetables, and low fat dairy, alcohol moderation, and smoking cessation.;Monitor prescription use compliance.    Expected Outcomes  Short Term: Continued assessment and intervention until BP is < 140/74mm HG in hypertensive participants. < 130/38mm HG in hypertensive participants with diabetes, heart failure or chronic kidney disease.;Long Term: Maintenance of blood pressure at goal levels.       Core Components/Risk Factors/Patient Goals Review:  Goals and Risk Factor Review    Row Name 11/14/17 1425             Core Components/Risk Factors/Patient Goals Review   Personal Goals Review  Heart Failure;Hypertension       Review  [name]'s vital signs have been stable at cardiac rehab. [name] reports feeling stronger since she has been partcipating in phase 2 cardiac rehab       Expected [place] will continue to partcipate in phase 2 cardiac rehab and take her medications as prescribed.          Core Components/Risk Factors/Patient Goals at Discharge (Final Review):  Goals and Risk Factor Review - 11/14/17 1425      Core Components/Risk Factors/Patient Goals Review   Personal Goals Review  Heart Failure;Hypertension    Review  [name]'s vital signs have been stable at cardiac rehab. [name] reports feeling stronger since she has been partcipating in phase 2 cardiac rehab    Expected Outcomes  [name] will continue to partcipate in phase 2 cardiac rehab and take her medications as prescribed.       ITP Comments: ITP Comments    Row Name 10/10/17 1209 10/17/17 1327 11/14/17 1424       ITP Comments  Dr. Fransico Him, Medical Director   30 day ITP comment. Patient started exercise at cardiac rehab today.  30 day ITP comment. Patient with good attendance and partcipation in phase 2 cardiac rehab        Comments: See ITP comments. [name] has lost  about 8 pounds since she started participating in cardiac rehab on May 9th. [name] reports having a decreased appetite and a bad taste in her mouth at times. Will notify her primary care provider. [name] reports feeling stronger since she has been participating in the [employer], RN,BSN 11/14/2017 2:48 PM

## 2017-11-15 ENCOUNTER — Encounter (HOSPITAL_COMMUNITY)
Admission: RE | Admit: 2017-11-15 | Discharge: 2017-11-15 | Disposition: A | Discharge status: Home / Self Care | Payer: Medicare Other | Source: Ambulatory Visit | Attending: Internal Medicine | Admitting: Internal Medicine

## 2017-11-15 ENCOUNTER — Ambulatory Visit (HOSPITAL_COMMUNITY)
Admission: RE | Admit: 2017-11-15 | Discharge: 2017-11-15 | Disposition: A | Discharge status: Home / Self Care | Payer: Medicare Other | Source: Ambulatory Visit | Attending: Cardiology | Admitting: Cardiology

## 2017-11-15 ENCOUNTER — Encounter (HOSPITAL_COMMUNITY): Payer: Medicare Other

## 2017-11-15 DIAGNOSIS — I5022 Chronic systolic (congestive) heart failure: Secondary | ICD-10-CM | POA: Diagnosis not present

## 2017-11-15 DIAGNOSIS — I10 Essential (primary) hypertension: Secondary | ICD-10-CM | POA: Diagnosis not present

## 2017-11-15 DIAGNOSIS — Z7902 Long term (current) use of antithrombotics/antiplatelets: Secondary | ICD-10-CM | POA: Diagnosis not present

## 2017-11-15 DIAGNOSIS — Z7901 Long term (current) use of anticoagulants: Secondary | ICD-10-CM | POA: Diagnosis not present

## 2017-11-15 DIAGNOSIS — Z79899 Other long term (current) drug therapy: Secondary | ICD-10-CM | POA: Diagnosis not present

## 2017-11-15 DIAGNOSIS — I213 ST elevation (STEMI) myocardial infarction of unspecified site: Secondary | ICD-10-CM | POA: Diagnosis not present

## 2017-11-15 DIAGNOSIS — Z955 Presence of coronary angioplasty implant and graft: Secondary | ICD-10-CM | POA: Diagnosis not present

## 2017-11-15 DIAGNOSIS — K219 Gastro-esophageal reflux disease without esophagitis: Secondary | ICD-10-CM | POA: Diagnosis not present

## 2017-11-15 LAB — BASIC METABOLIC PANEL
Anion gap: 6 (ref 5–15)
BUN: 29 mg/dL — AB (ref 6–20)
CHLORIDE: 110 mmol/L (ref 101–111)
CO2: 25 mmol/L (ref 22–32)
CREATININE: 1.96 mg/dL — AB (ref 0.44–1.00)
Calcium: 10.8 mg/dL — ABNORMAL HIGH (ref 8.9–10.3)
GFR calc Af Amer: 27 mL/min — ABNORMAL LOW (ref >60)
GFR calc non Af Amer: 23 mL/min — ABNORMAL LOW (ref >60)
Glucose, Bld: 120 mg/dL — ABNORMAL HIGH (ref 65–99)
POTASSIUM: 4.5 mmol/L (ref 3.5–5.1)
SODIUM: 141 mmol/L (ref 135–145)

## 2017-11-18 ENCOUNTER — Telehealth (HOSPITAL_COMMUNITY): Payer: Self-pay | Admitting: Cardiology

## 2017-11-18 ENCOUNTER — Encounter (HOSPITAL_COMMUNITY): Payer: Medicare Other

## 2017-11-18 ENCOUNTER — Encounter (HOSPITAL_COMMUNITY)
Admission: RE | Admit: 2017-11-18 | Discharge: 2017-11-18 | Disposition: A | Discharge status: Home / Self Care | Payer: Medicare Other | Source: Ambulatory Visit | Attending: Internal Medicine | Admitting: Internal Medicine

## 2017-11-18 DIAGNOSIS — Z955 Presence of coronary angioplasty implant and graft: Secondary | ICD-10-CM | POA: Diagnosis not present

## 2017-11-18 DIAGNOSIS — I213 ST elevation (STEMI) myocardial infarction of unspecified site: Secondary | ICD-10-CM | POA: Diagnosis not present

## 2017-11-18 DIAGNOSIS — I10 Essential (primary) hypertension: Secondary | ICD-10-CM | POA: Diagnosis not present

## 2017-11-18 DIAGNOSIS — K219 Gastro-esophageal reflux disease without esophagitis: Secondary | ICD-10-CM | POA: Diagnosis not present

## 2017-11-18 DIAGNOSIS — Z79899 Other long term (current) drug therapy: Secondary | ICD-10-CM | POA: Diagnosis not present

## 2017-11-18 DIAGNOSIS — I5021 Acute systolic (congestive) heart failure: Secondary | ICD-10-CM

## 2017-11-18 DIAGNOSIS — Z7901 Long term (current) use of anticoagulants: Secondary | ICD-10-CM | POA: Diagnosis not present

## 2017-11-18 DIAGNOSIS — Z7902 Long term (current) use of antithrombotics/antiplatelets: Secondary | ICD-10-CM | POA: Diagnosis not present

## 2017-11-18 NOTE — Telephone Encounter (Signed)
Notes recorded by Kerry Dory, CMA on 11/18/2017 at 10:35 AM EDT Patient reports she is only taking once weekly, will only take as needed for weight gain,increased SOB,. Or edema Repeat labs 7/2 ------  Notes recorded by Shirley Friar, PA-C on 11/18/2017 at 7:27 AM EDT Make sure she is only taking lasix Twice WEEKLY.  If she is, can change lasix to as needed. Repeat BMET 2 weeks.     Legrand Como [address] [place], PA-C 11/18/2017 7:27 AM

## 2017-11-18 NOTE — Telephone Encounter (Signed)
-----   Message from Shirley Friar, PA-C sent at 11/18/2017  7:27 AM EDT ----- Make sure she is only taking lasix Twice WEEKLY.   If she is, can change lasix to as needed.  Repeat BMET 2 weeks.     Legrand Como [address]" [place], PA-C 11/18/2017 7:27 AM

## 2017-11-19 ENCOUNTER — Inpatient Hospital Stay: Payer: Medicare Other | Admitting: Oncology

## 2017-11-19 ENCOUNTER — Inpatient Hospital Stay: Payer: Medicare Other

## 2017-11-19 ENCOUNTER — Telehealth: Payer: Self-pay | Admitting: Oncology

## 2017-11-19 NOTE — Telephone Encounter (Signed)
Patient called to reschedule  °

## 2017-11-20 ENCOUNTER — Encounter (HOSPITAL_COMMUNITY): Payer: Medicare Other

## 2017-11-20 ENCOUNTER — Encounter (HOSPITAL_COMMUNITY)
Admission: RE | Admit: 2017-11-20 | Discharge: 2017-11-20 | Disposition: A | Discharge status: Home / Self Care | Payer: Medicare Other | Source: Ambulatory Visit | Attending: Internal Medicine | Admitting: Internal Medicine

## 2017-11-20 DIAGNOSIS — Z7901 Long term (current) use of anticoagulants: Secondary | ICD-10-CM | POA: Diagnosis not present

## 2017-11-20 DIAGNOSIS — I213 ST elevation (STEMI) myocardial infarction of unspecified site: Secondary | ICD-10-CM | POA: Diagnosis not present

## 2017-11-20 DIAGNOSIS — Z955 Presence of coronary angioplasty implant and graft: Secondary | ICD-10-CM

## 2017-11-20 DIAGNOSIS — Z7902 Long term (current) use of antithrombotics/antiplatelets: Secondary | ICD-10-CM | POA: Diagnosis not present

## 2017-11-20 DIAGNOSIS — I10 Essential (primary) hypertension: Secondary | ICD-10-CM | POA: Diagnosis not present

## 2017-11-20 DIAGNOSIS — Z79899 Other long term (current) drug therapy: Secondary | ICD-10-CM | POA: Diagnosis not present

## 2017-11-20 DIAGNOSIS — K219 Gastro-esophageal reflux disease without esophagitis: Secondary | ICD-10-CM | POA: Diagnosis not present

## 2017-11-22 ENCOUNTER — Encounter (HOSPITAL_COMMUNITY): Payer: Medicare Other

## 2017-11-22 ENCOUNTER — Encounter (HOSPITAL_COMMUNITY)
Admission: RE | Admit: 2017-11-22 | Discharge: 2017-11-22 | Disposition: A | Discharge status: Home / Self Care | Payer: Medicare Other | Source: Ambulatory Visit | Attending: Internal Medicine | Admitting: Internal Medicine

## 2017-11-22 DIAGNOSIS — Z7901 Long term (current) use of anticoagulants: Secondary | ICD-10-CM | POA: Diagnosis not present

## 2017-11-22 DIAGNOSIS — I10 Essential (primary) hypertension: Secondary | ICD-10-CM | POA: Diagnosis not present

## 2017-11-22 DIAGNOSIS — I213 ST elevation (STEMI) myocardial infarction of unspecified site: Secondary | ICD-10-CM | POA: Diagnosis not present

## 2017-11-22 DIAGNOSIS — Z955 Presence of coronary angioplasty implant and graft: Secondary | ICD-10-CM

## 2017-11-22 DIAGNOSIS — K219 Gastro-esophageal reflux disease without esophagitis: Secondary | ICD-10-CM | POA: Diagnosis not present

## 2017-11-22 DIAGNOSIS — Z7902 Long term (current) use of antithrombotics/antiplatelets: Secondary | ICD-10-CM | POA: Diagnosis not present

## 2017-11-22 DIAGNOSIS — Z79899 Other long term (current) drug therapy: Secondary | ICD-10-CM | POA: Diagnosis not present

## 2017-11-25 ENCOUNTER — Encounter (HOSPITAL_COMMUNITY): Payer: Medicare Other

## 2017-11-25 ENCOUNTER — Encounter (HOSPITAL_COMMUNITY)
Admission: RE | Admit: 2017-11-25 | Discharge: 2017-11-25 | Disposition: A | Discharge status: Home / Self Care | Payer: Medicare Other | Source: Ambulatory Visit | Attending: Internal Medicine | Admitting: Internal Medicine

## 2017-11-25 DIAGNOSIS — Z79899 Other long term (current) drug therapy: Secondary | ICD-10-CM | POA: Diagnosis not present

## 2017-11-25 DIAGNOSIS — I213 ST elevation (STEMI) myocardial infarction of unspecified site: Secondary | ICD-10-CM

## 2017-11-25 DIAGNOSIS — Z7901 Long term (current) use of anticoagulants: Secondary | ICD-10-CM | POA: Diagnosis not present

## 2017-11-25 DIAGNOSIS — Z7902 Long term (current) use of antithrombotics/antiplatelets: Secondary | ICD-10-CM | POA: Diagnosis not present

## 2017-11-25 DIAGNOSIS — I10 Essential (primary) hypertension: Secondary | ICD-10-CM | POA: Diagnosis not present

## 2017-11-25 DIAGNOSIS — K219 Gastro-esophageal reflux disease without esophagitis: Secondary | ICD-10-CM | POA: Diagnosis not present

## 2017-11-25 DIAGNOSIS — Z955 Presence of coronary angioplasty implant and graft: Secondary | ICD-10-CM | POA: Diagnosis not present

## 2017-11-27 ENCOUNTER — Encounter (HOSPITAL_COMMUNITY): Payer: Medicare Other

## 2017-11-27 ENCOUNTER — Encounter (HOSPITAL_COMMUNITY)
Admission: RE | Admit: 2017-11-27 | Discharge: 2017-11-27 | Disposition: A | Discharge status: Home / Self Care | Payer: Medicare Other | Source: Ambulatory Visit | Attending: Internal Medicine | Admitting: Internal Medicine

## 2017-11-27 DIAGNOSIS — Z79899 Other long term (current) drug therapy: Secondary | ICD-10-CM | POA: Diagnosis not present

## 2017-11-27 DIAGNOSIS — I213 ST elevation (STEMI) myocardial infarction of unspecified site: Secondary | ICD-10-CM | POA: Diagnosis not present

## 2017-11-27 DIAGNOSIS — K219 Gastro-esophageal reflux disease without esophagitis: Secondary | ICD-10-CM | POA: Diagnosis not present

## 2017-11-27 DIAGNOSIS — I10 Essential (primary) hypertension: Secondary | ICD-10-CM | POA: Diagnosis not present

## 2017-11-27 DIAGNOSIS — Z955 Presence of coronary angioplasty implant and graft: Secondary | ICD-10-CM | POA: Diagnosis not present

## 2017-11-27 DIAGNOSIS — Z7901 Long term (current) use of anticoagulants: Secondary | ICD-10-CM | POA: Diagnosis not present

## 2017-11-27 DIAGNOSIS — Z7902 Long term (current) use of antithrombotics/antiplatelets: Secondary | ICD-10-CM | POA: Diagnosis not present

## 2017-11-29 ENCOUNTER — Encounter (HOSPITAL_COMMUNITY): Payer: Medicare Other

## 2017-11-29 ENCOUNTER — Encounter (HOSPITAL_COMMUNITY)
Admission: RE | Admit: 2017-11-29 | Discharge: 2017-11-29 | Disposition: A | Discharge status: Home / Self Care | Payer: Medicare Other | Source: Ambulatory Visit | Attending: Internal Medicine | Admitting: Internal Medicine

## 2017-11-29 DIAGNOSIS — Z955 Presence of coronary angioplasty implant and graft: Secondary | ICD-10-CM

## 2017-11-29 DIAGNOSIS — Z79899 Other long term (current) drug therapy: Secondary | ICD-10-CM | POA: Diagnosis not present

## 2017-11-29 DIAGNOSIS — Z7901 Long term (current) use of anticoagulants: Secondary | ICD-10-CM | POA: Diagnosis not present

## 2017-11-29 DIAGNOSIS — K219 Gastro-esophageal reflux disease without esophagitis: Secondary | ICD-10-CM | POA: Diagnosis not present

## 2017-11-29 DIAGNOSIS — I213 ST elevation (STEMI) myocardial infarction of unspecified site: Secondary | ICD-10-CM | POA: Diagnosis not present

## 2017-11-29 DIAGNOSIS — Z7902 Long term (current) use of antithrombotics/antiplatelets: Secondary | ICD-10-CM | POA: Diagnosis not present

## 2017-11-29 DIAGNOSIS — I10 Essential (primary) hypertension: Secondary | ICD-10-CM | POA: Diagnosis not present

## 2017-12-02 ENCOUNTER — Encounter (HOSPITAL_COMMUNITY): Payer: Medicare Other

## 2017-12-02 ENCOUNTER — Encounter (HOSPITAL_COMMUNITY)
Admission: RE | Admit: 2017-12-02 | Discharge: 2017-12-02 | Disposition: A | Discharge status: Home / Self Care | Payer: Medicare Other | Source: Ambulatory Visit | Attending: Internal Medicine | Admitting: Internal Medicine

## 2017-12-02 DIAGNOSIS — I10 Essential (primary) hypertension: Secondary | ICD-10-CM | POA: Diagnosis not present

## 2017-12-02 DIAGNOSIS — Z7901 Long term (current) use of anticoagulants: Secondary | ICD-10-CM | POA: Diagnosis not present

## 2017-12-02 DIAGNOSIS — K219 Gastro-esophageal reflux disease without esophagitis: Secondary | ICD-10-CM | POA: Diagnosis not present

## 2017-12-02 DIAGNOSIS — Z955 Presence of coronary angioplasty implant and graft: Secondary | ICD-10-CM | POA: Diagnosis not present

## 2017-12-02 DIAGNOSIS — I213 ST elevation (STEMI) myocardial infarction of unspecified site: Secondary | ICD-10-CM | POA: Insufficient documentation

## 2017-12-02 DIAGNOSIS — Z7902 Long term (current) use of antithrombotics/antiplatelets: Secondary | ICD-10-CM | POA: Insufficient documentation

## 2017-12-02 DIAGNOSIS — Z79899 Other long term (current) drug therapy: Secondary | ICD-10-CM | POA: Diagnosis not present

## 2017-12-03 ENCOUNTER — Encounter: Payer: Self-pay | Admitting: Family Medicine

## 2017-12-03 ENCOUNTER — Ambulatory Visit (HOSPITAL_COMMUNITY)
Admission: RE | Admit: 2017-12-03 | Discharge: 2017-12-03 | Disposition: A | Discharge status: Home / Self Care | Payer: Medicare Other | Source: Ambulatory Visit | Attending: Internal Medicine | Admitting: Internal Medicine

## 2017-12-03 ENCOUNTER — Other Ambulatory Visit: Payer: Self-pay

## 2017-12-03 ENCOUNTER — Ambulatory Visit (INDEPENDENT_AMBULATORY_CARE_PROVIDER_SITE_OTHER): Payer: Medicare Other | Admitting: Family Medicine

## 2017-12-03 VITALS — BP 125/63 | HR 59 | Temp 98.0°F | Resp 16 | Ht 68.0 in | Wt 147.0 lb

## 2017-12-03 DIAGNOSIS — B370 Candidal stomatitis: Secondary | ICD-10-CM

## 2017-12-03 DIAGNOSIS — R432 Parageusia: Secondary | ICD-10-CM | POA: Diagnosis not present

## 2017-12-03 DIAGNOSIS — R634 Abnormal weight loss: Secondary | ICD-10-CM

## 2017-12-03 DIAGNOSIS — I5021 Acute systolic (congestive) heart failure: Secondary | ICD-10-CM

## 2017-12-03 DIAGNOSIS — K5904 Chronic idiopathic constipation: Secondary | ICD-10-CM

## 2017-12-03 DIAGNOSIS — R3121 Asymptomatic microscopic hematuria: Secondary | ICD-10-CM | POA: Diagnosis not present

## 2017-12-03 DIAGNOSIS — D649 Anemia, unspecified: Secondary | ICD-10-CM

## 2017-12-03 DIAGNOSIS — N184 Chronic kidney disease, stage 4 (severe): Secondary | ICD-10-CM | POA: Diagnosis not present

## 2017-12-03 LAB — POCT URINALYSIS DIP (MANUAL ENTRY)
BILIRUBIN UA: NEGATIVE
Glucose, UA: NEGATIVE mg/dL
NITRITE UA: NEGATIVE
PH UA: 6.5 (ref 5.0–8.0)
Spec Grav, UA: 1.015 (ref 1.010–1.025)
UROBILINOGEN UA: 0.2 U/dL

## 2017-12-03 LAB — POCT SKIN KOH: Skin KOH, POC: POSITIVE — AB

## 2017-12-03 LAB — BASIC METABOLIC PANEL
ANION GAP: 8 (ref 5–15)
BUN: 23 mg/dL (ref 8–23)
CO2: 25 mmol/L (ref 22–32)
Calcium: 10.8 mg/dL — ABNORMAL HIGH (ref 8.9–10.3)
Chloride: 109 mmol/L (ref 98–111)
Creatinine, Ser: 1.92 mg/dL — ABNORMAL HIGH (ref 0.44–1.00)
GFR, EST AFRICAN AMERICAN: 27 mL/min — AB (ref >60)
GFR, EST NON AFRICAN AMERICAN: 24 mL/min — AB (ref >60)
GLUCOSE: 108 mg/dL — AB (ref 70–99)
Potassium: 4.5 mmol/L (ref 3.5–5.1)
SODIUM: 142 mmol/L (ref 135–145)

## 2017-12-03 LAB — POC MICROSCOPIC URINALYSIS (UMFC): Mucus: ABSENT

## 2017-12-03 NOTE — Patient Instructions (Addendum)
Start a protein supplement like Ensure or Boost of your choice at least two if not 3 times a day AFTER meals. Make sure your constipation is FULLY treated with a glass full of prune juice EVERY DAY and consider adding a senokot S or 2 before bed at night if you would like as well.  Recheck with me in 4 weeks to ensure you are gaining weight and review the next steps.  See the last part for diet recommendations but check with your dietician at cardiac rehab.   IF you received an x-ray today, you will receive an invoice from [facility]. Please contact [facility] at [phone] with questions or concerns regarding your invoice.   IF you received labwork today, you will receive an invoice from [facility]. Please contact [facility] at [phone] with questions or concerns regarding your invoice.   Our billing staff will not be able to assist you with questions regarding bills from these companies.  You will be contacted with the lab results as soon as they are available. The fastest way to get your results is to activate your My Chart account. Instructions are located on the last page of this paperwork. If you have not heard from [place] regarding the results in 2 weeks, please contact this office.     Constipation, Adult Constipation is when a person has fewer bowel movements in a week than normal, has difficulty having a bowel movement, or has stools that are dry, hard, or larger than normal. Constipation may be caused by an underlying condition. It may become worse with age if a person takes certain medicines and does not take in enough fluids. Follow these instructions at home: Eating and drinking   Eat foods that have a lot of fiber, such as fresh fruits and vegetables, whole grains, and beans.  Limit foods that are high in fat, low in fiber, or overly processed, such as french fries, hamburgers, cookies, candies, and soda.  Drink enough fluid to keep your urine clear or  pale yellow. General instructions  Exercise regularly or as told by your health care provider.  Go to the restroom when you have the urge to go. Do not hold it in.  Take over-the-counter and prescription medicines only as told by your health care provider. These include any fiber supplements.  Practice pelvic floor retraining exercises, such as deep breathing while relaxing the lower abdomen and pelvic floor relaxation during bowel movements.  Watch your condition for any changes.  Keep all follow-up visits as told by your health care provider. This is important. Contact a health care provider if:  You have pain that gets worse.  You have a fever.  You do not have a bowel movement after 4 days.  You vomit.  You are not hungry.  You lose weight.  You are bleeding from the anus.  You have thin, pencil-like stools. Get help right away if:  You have a fever and your symptoms suddenly get worse.  You leak stool or have blood in your stool.  Your abdomen is bloated.  You have severe pain in your abdomen.  You feel dizzy or you faint. This information is not intended to replace advice given to you by your health care provider. Make sure you discuss any questions you have with your health care provider. Document Released: 02/17/2004 Document Revised: 12/09/2015 Document Reviewed: 11/09/2015 Elsevier Interactive Patient Education  2018 [place].  High-Protein and High-Calorie Diet Eating high-protein and high-calorie foods can help you  to gain weight, heal after an injury, and recover after an illness or surgery. What is my plan? The specific amount of daily protein and calories you need depends on:  Your body weight.  The reason this diet is recommended for you.  Generally, a high-protein, high-calorie diet involves:  Eating 250-500 extra calories each day.  Making sure that 10-35% of your daily calories come from protein.  Talk to your health care provider  about how much protein and how many calories you need each day. Follow the diet as directed by your health care provider. What do I need to know about this diet?  Ask your health care provider if you should take a nutritional supplement.  Try to eat six small meals each day instead of three large meals.  Eat a balanced diet, including one food that is high in protein at each meal.  Keep nutritious snacks handy, such as nuts, trail mixes, dried fruit, and yogurt.  If you have kidney disease or diabetes, eating too much protein may put extra stress on your kidneys. Talk to your health care provider if you have either of those conditions. What are some high-protein foods? Grains Quinoa. Bulgur wheat. Vegetables Soybeans. Peas. Meats and Other Protein Sources Beef, pork, and poultry. Fish and seafood. Eggs. Tofu. Textured vegetable protein (TVP). Peanut butter. Nuts and seeds. Dried beans. Protein powders. Dairy Whole milk. Whole-milk yogurt. Powdered milk. Cheese. [employer]. Eggnog. Beverages High-protein supplement drinks. Soy milk. Other Protein bars. The items listed above may not be a complete list of recommended foods or beverages. Contact your dietitian for more options. What are some high-calorie foods? Grains Pasta. Quick breads. Muffins. Pancakes. Ready-to-eat cereal. Vegetables Vegetables cooked in oil or butter. Fried potatoes. Fruits Dried fruit. Fruit leather. Canned fruit in syrup. Fruit juice. Avocados. Meats and Other Protein Sources Peanut butter. Nuts and seeds. Dairy Heavy cream. Whipped cream. Cream cheese. Sour cream. Ice cream. Custard. Pudding. Beverages Meal-replacement beverages. Nutrition shakes. Fruit juice. Sugar-sweetened soft drinks. Condiments Salad dressing. Mayonnaise. Alfredo sauce. Fruit preserves or jelly. Honey. Syrup. Sweets/Desserts Cake. Cookies. Pie. Pastries. Candy bars. Chocolate. Fats and Oils Butter or margarine. Oil.  Gravy. Other Meal-replacement bars. The items listed above may not be a complete list of recommended foods or beverages. Contact your dietitian for more options. What are some tips for including high-protein and high-calorie foods in my diet?  Add whole milk, half-and-half, or heavy cream to cereal, pudding, soup, or hot cocoa.  Add whole milk to instant breakfast drinks.  Add peanut butter to oatmeal or smoothies.  Add powdered milk to baked goods, smoothies, or milkshakes.  Add powdered milk, cream, or butter to mashed potatoes.  Add cheese to cooked vegetables.  Make whole-milk yogurt parfaits. Top them with granola, fruit, or nuts.  Add cottage cheese to your fruit.  Add avocados, cheese, or both to sandwiches or salads.  Add meat, poultry, or seafood to rice, pasta, casseroles, salads, and soups.  Use mayonnaise when making egg salad, chicken salad, or tuna salad.  Use peanut butter as a topping for pretzels, celery, or crackers.  Add beans to casseroles, dips, and spreads.  Add pureed beans to sauces and soups.  Replace calorie-free drinks with calorie-containing drinks, such as milk and fruit juice. This information is not intended to replace advice given to you by your health care provider. Make sure you discuss any questions you have with your health care provider. Document Released: 05/21/2005 Document Revised: 10/27/2015 Document Reviewed: 11/03/2013  Chartered certified accountant Patient Education  [employer].

## 2017-12-03 NOTE — Progress Notes (Signed)
Subjective:  By signing my name below, I, Moises Blood, attest that this documentation has been prepared under the direction and in the presence of Delman Cheadle, MD. Electronically Signed: Moises Blood, [place]. 12/03/2017 , 2:56 PM .  Patient was seen in Room 1 .   Patient ID: [name], female    DOB: 1934/12/13, 83 y.o.   MRN: [id number] Chief Complaint  Patient presents with  . Weight Loss   HPI [name] is a 83 y.o. female who presents to Primary Care at [facility] complaining of weight loss. Received a note from cardiac rehab, patient had lost weight since initial rehab on 09/10/17 since her MI in April. She's had decreased appetite because everything tastes bad, and also has a bad taste in her mouth. At her initial rehab visit on 4/9, her weight was 79 kg; weight down to 68.1 kg on 5/31; then weight down again to 67 kg on 6/5; last visit with rehab, she was back up to 68.3 kg on 6/12. Today, on triage, her weight is down to 66 kg.   Patient confirms not eating due to bad taste in her mouth. She believes it's caused by her medications. She's been instructed to reduce her salt intake, and had to get used to the lack of taste. She believes taste has improved in the last week. She's planning to try to eat more. She mentions history of acid indigestion, and would mostly take Tums for it. She hasn't been taking Tums recently. She denies bloating or abdominal cramping. She hasn't seen dentist in a while. She denies thick phlegm in the morning, sore throat, trouble swallowing, choking on food, seasonal allergies, sinus pain, sinus pressure, change in hair or skin, or night sweats. Her bowels haven't been normal due to lack of eating. She drinks prune juice, but not daily; only when she misses a day of bowel movement.   She is overdue for her mammogram, as too much sternal and chest wall pain from her surgery with MI, but is recovering now. She plans to schedule for her mammogram. Her last  mammogram was on 09/28/16, which showed large benign fibroadenolipoma in right breast. History of lumpectomy in left breast in 2016. She still has her uterus and ovaries. Her last abdominal imaging was in 2012, done by Dr. Watt Climes, stable right hepatic lobe mass, stable from 2009. She has a history of nephrolithiasis and chololithiasis.   She asked if she can take bilberry supplements for her eyes. She's currently drinking 2% milk and will have 5 crackers with melted cheese for meals 2-3 times a day without snacks in between. She noticed notably large amount of hard stool in her rectum. She noted she had intermittent problems with constipation throughout her life, unchanged at baseline. She will check with dietician at cardiac rehab to see if she can change to whole milk. She called Dr. Perley Jain office 1 year ago to see if it's time for her colonoscopy, and was informed she had to wait 2 more years.   Now has a. Fib on her chart - was that just during her recent MI 07/2017 - recurred??? Acute systolic heart failure??  Past Medical History:  Diagnosis Date  . Anxiety   . Breast cancer ([place])   . Breast cancer of lower-outer quadrant of left female breast ([place]) 10/11/2014  . GERD (gastroesophageal reflux disease)   . Hypertension   . Personal history of radiation therapy    2016  . PONV (postoperative nausea  and vomiting)   . PUD (peptic ulcer disease)   . Stone, kidney    Past Surgical History:  Procedure Laterality Date  . BREAST LUMPECTOMY WITH SENTINEL LYMPH NODE BIOPSY Left 10/22/2014   Procedure: BREAST LUMPECTOMY WITH SENTINEL LYMPH NODE BIOPSY;  Surgeon: Autumn Messing III, MD;  Location: [facility];  Service: General;  Laterality: Left;  . CORONARY STENT INTERVENTION N/A 07/27/2017   Procedure: CORONARY STENT INTERVENTION;  Surgeon: Martinique, Peter M, MD;  Location: [place] [facility];  Service: Cardiovascular;  Laterality: N/A;  . CORONARY/GRAFT ACUTE MI REVASCULARIZATION N/A  07/27/2017   Procedure: Coronary/Graft Acute MI Revascularization;  Surgeon: Martinique, Peter M, MD;  Location: [place];  Service: Cardiovascular;  Laterality: N/A;  . LEFT HEART CATH AND CORONARY ANGIOGRAPHY N/A 07/27/2017   Procedure: LEFT HEART CATH AND CORONARY ANGIOGRAPHY;  Surgeon: Martinique, Peter M, MD;  Location: [place] [facility];  Service: Cardiovascular;  Laterality: N/A;  . RE-EXCISION OF BREAST CANCER,SUPERIOR MARGINS Left 11/04/2014   Procedure: RE-EXCISION OF LEFT BREAST INFERIOR MARGINS;  Surgeon: Autumn Messing III, MD;  Location: [facility];  Service: General;  Laterality: Left;   Prior to Admission medications   Medication Sig Start Date End Date Taking? Authorizing Provider  anastrozole (ARIMIDEX) 1 MG tablet TAKE ONE TABLET BY MOUTH ONCE DAILY 04/05/17  Yes Magrinat, Virgie Dad, MD  apixaban (ELIQUIS) 2.5 MG TABS tablet Take 1 tablet (2.5 mg total) by mouth 2 (two) times daily. 08/07/17  Yes Clegg, Amy D, NP  atorvastatin (LIPITOR) 80 MG tablet Take 1 tablet (80 mg total) by mouth daily at 6 PM. 08/07/17  Yes Clegg, Amy D, NP  Calcium Carb-Cholecalciferol (CALCIUM + D3) 600-800 MG-UNIT TABS Take 1 tablet by mouth daily.    Yes [provider]  cholecalciferol (VITAMIN D) 1000 UNITS tablet Take 1,000 Units by mouth daily. Over the counter   Yes [provider]  citalopram (CELEXA) 10 MG tablet Take 1 tablet (10 mg total) by mouth daily. 08/08/17  Yes Clegg, Amy D, NP  clopidogrel (PLAVIX) 75 MG tablet Take 1 tablet (75 mg total) by mouth daily. 08/08/17  Yes Clegg, Amy D, NP  co-enzyme Q-10 30 MG capsule Take 30 mg by mouth 3 (three) times daily.   Yes [provider]  fish oil-omega-3 fatty acids 1000 MG capsule Take 1 g by mouth daily.   Yes [provider]  losartan (COZAAR) 25 MG tablet Take 1 tablet (25 mg total) by mouth 2 (two) times daily. 08/07/17  Yes Clegg, Amy D, NP  magnesium gluconate (MAGONATE) 500 MG tablet Take 500 mg by mouth 2 (two) times  daily.   Yes [provider]  Multiple Vitamin (MULTIVITAMIN) tablet Take 1 tablet by mouth daily.   Yes [provider]  nitroGLYCERIN (NITROSTAT) 0.3 MG SL tablet Place 1 tablet (0.3 mg total) under the tongue every 5 (five) minutes as needed for chest pain. 08/07/17  Yes Clegg, Amy D, NP  spironolactone (ALDACTONE) 25 MG tablet Take 1 tablet (25 mg total) by mouth daily. 08/08/17  Yes Clegg, Amy D, NP  furosemide (LASIX) 20 MG tablet Take 1 tablet (20 mg total) by mouth 2 (two) times a week. Take every Monday and Thursday. Patient not taking: Reported on 12/03/2017 08/29/17   Conrad [place], NP   Allergies  Allergen Reactions  . Lisinopril Cough   Family History  Problem Relation Age of Onset  . Pancreatic cancer Mother  pancreastic  . Stroke Father   . Hypertension Brother   . Hypertension Sister   . Hypertension Brother   . Heart disease Brother   . Hypertension Sister    Social History   Socioeconomic History  . Marital status: Married    Spouse name: Not on file  . Number of children: Not on file  . Years of education: Not on file  . Highest education level: Not on file  Occupational History  . Occupation: retired  Scientific laboratory technician  . Financial resource strain: Not on file  . Food insecurity:    Worry: Not on file    Inability: Not on file  . Transportation needs:    Medical: Not on file    Non-medical: Not on file  Tobacco Use  . Smoking status: Never Smoker  . Smokeless tobacco: Never Used  Substance and Sexual Activity  . Alcohol use: No  . Drug use: No  . Sexual activity: Not on file  Lifestyle  . Physical activity:    Days per week: Not on file    Minutes per session: Not on file  . Stress: Not on file  Relationships  . Social connections:    Talks on phone: Not on file    Gets together: Not on file    Attends religious service: Not on file    Active member of club or organization: Not on file    Attends meetings of clubs or  organizations: Not on file    Relationship status: Not on file  Other Topics Concern  . Not on file  Social History Narrative  . Not on file   Depression screen [facility] 2/9 12/03/2017 10/14/2017 07/11/2017 02/28/2017 11/05/2016  Decreased Interest 0 0 0 0 0  Down, Depressed, Hopeless 0 0 0 0 0  PHQ - 2 Score 0 0 0 0 0    Review of Systems  Constitutional: Positive for appetite change and unexpected weight change. Negative for chills, fatigue and fever.  HENT: Negative for sinus pressure, sinus pain, sore throat and trouble swallowing.   Respiratory: Negative for cough.   Gastrointestinal: Negative for abdominal distention, abdominal pain, constipation, diarrhea, nausea and vomiting.  Skin: Negative for rash and wound.  Neurological: Negative for dizziness, weakness and headaches.       Objective:   Physical Exam  Constitutional: She is oriented to person, place, and time. She appears well-developed and well-nourished. No distress.  HENT:  Head: Normocephalic and atraumatic.  Left Ear: Tympanic membrane normal.  Nose: Nose normal.  Right ear canal: cerumen in canal Oropharynx: tongue with some slight freckled hyperpigmentation on left buds  Eyes: Pupils are equal, round, and reactive to light. EOM are normal.  Neck: Neck supple.  Cardiovascular: Normal rate.  Pulmonary/Chest: Effort normal. No respiratory distress.  Breast exam: some undefinable non mobile fullness at left lower lateral aspect, suspect where rib rubs along underwire of bra; large well defined well mobile right fibroadenoma, unchanged  Abdominal: Bowel sounds are normal. There is no tenderness.  Genitourinary:  Genitourinary Comments: Pelvic exam: no tenderness, fullness or masses with uterine and adnexa palpation  Musculoskeletal: Normal range of motion.  Neurological: She is alert and oriented to person, place, and time.  Skin: Skin is warm and dry.  Psychiatric: She has a normal mood and affect. Her behavior is  normal.  Nursing note and vitals reviewed.   BP 125/63 (BP Location: Right Arm, Patient Position: Sitting, Cuff Size: Normal)   Pulse (!) 59  Temp 98 F (36.7 C) (Oral)   Resp 16   Ht _0  (1.727 m)   Wt 147 lb (66.7 kg)   SpO2 100%   BMI 22.35 kg/m   TONGUE SCRAPING KOH POSITIVE    Assessment & Plan:   1. Unintentional weight loss   2. Asymptomatic microscopic hematuria   3. Chronic idiopathic constipation   4. Abnormal sense of taste   5. Thrush   6. Chronic kidney disease, stage IV (severe) (HCC) -  This is completely new since I last saw pt 5 mos ago when her eGFR >60 with baseline Cr of 1.0.  I am concerned the anemia, hypercalcemia, and even weight loss could be related so placed stat nephrology c/s.   Summary of renal decline: When admitted with MI 2/23 and during hosp Cr ranged 1.2-1.3 for the day but then gradually started trending up and hasn't seemingly stopped on the upward trend sense. Was hosp x 2 wks 2/23-3/6.  For most of her hosp Cr was ~1.5-1.6 with eGFR in low 30s but on day of d/c was highest at Cr 1.68 (eGFR 32). At first hosp f/u 1 wk later which was the heart failure clinic, Cr has jumped to 1.92 (eGFR 27) bu then gradually trended back down to 1.58 over next 2 weeks. Then wasn't rechecked x 2 mos (which was 1 mo ago 10/31/17) but Cr had bumped back up to 1.91 (eGFR 27) where it has persistently remained since on this every 2 weeks check.   7. Anemia, unspecified type - baseline hgb 11.5-12.2 - 3d after her cath her hgb dropped to 10.2 and cont to steadily march down during hosp to 9.3. In the past 4 mos has remained steady and really a slight steady increase consistent at 9.7 today.  Could be due to blood loss she had from the coronary cath/graft/stent and then has not had the nutritional intake to replace blood count since - consider check ferritin, iron, b12, folate, retics w/ next labs. But more likely due to chronic disease with kidney failure - refer to  nephrology  8. Hypercalcemia - was normal during pts hosp but has been elevated to a mild-mod degree sense - stable at 10.8-11.2 (nml 8.7-10.3) over past 3 mos.  Likely from the spironolactone. ..   Will ask renal to HELP    Orders Placed This Encounter  Procedures  . Urine Culture  . TSH+T4F+T3Free  . CBC with Differential/Platelet  . Comprehensive metabolic panel  . H. pylori breath test  . POCT urinalysis dipstick  . POCT Microscopic Urinalysis ([facility])  . POCT Skin KOH    No orders of the defined types were placed in this encounter.   I personally performed the services described in this documentation, which was scribed in my presence. The recorded information has been reviewed and considered, and addended by me as needed.   Delman Cheadle, M.D.  Primary Care at [facility] [address] [place], [place] [address] [phone] phone [phone] fax  12/05/17 2:57 AM

## 2017-12-04 ENCOUNTER — Encounter (HOSPITAL_COMMUNITY): Payer: Medicare Other

## 2017-12-04 LAB — CBC WITH DIFFERENTIAL/PLATELET
BASOS ABS: 0 10*3/uL (ref 0.0–0.2)
Basos: 1 %
EOS (ABSOLUTE): 0.1 10*3/uL (ref 0.0–0.4)
EOS: 1 %
HEMATOCRIT: 29.8 % — AB (ref 34.0–46.6)
Hemoglobin: 9.7 g/dL — ABNORMAL LOW (ref 11.1–15.9)
IMMATURE GRANS (ABS): 0 10*3/uL (ref 0.0–0.1)
Immature Granulocytes: 0 %
LYMPHS: 17 %
Lymphocytes Absolute: 1.1 10*3/uL (ref 0.7–3.1)
MCH: 30.5 pg (ref 26.6–33.0)
MCHC: 32.6 g/dL (ref 31.5–35.7)
MCV: 94 fL (ref 79–97)
Monocytes Absolute: 0.6 10*3/uL (ref 0.1–0.9)
Monocytes: 9 %
NEUTROS ABS: 4.4 10*3/uL (ref 1.4–7.0)
Neutrophils: 72 %
Platelets: 220 10*3/uL (ref 150–450)
RBC: 3.18 x10E6/uL — ABNORMAL LOW (ref 3.77–5.28)
RDW: 14.1 % (ref 12.3–15.4)
WBC: 6.1 10*3/uL (ref 3.4–10.8)

## 2017-12-04 LAB — COMPREHENSIVE METABOLIC PANEL
ALBUMIN: 4.7 g/dL (ref 3.5–4.7)
ALT: 20 IU/L (ref 0–32)
AST: 19 IU/L (ref 0–40)
Albumin/Globulin Ratio: 2.2 (ref 1.2–2.2)
Alkaline Phosphatase: 45 IU/L (ref 39–117)
BUN / CREAT RATIO: 14 (ref 12–28)
BUN: 26 mg/dL (ref 8–27)
Bilirubin Total: 0.5 mg/dL (ref 0.0–1.2)
CO2: 22 mmol/L (ref 20–29)
CREATININE: 1.87 mg/dL — AB (ref 0.57–1.00)
Calcium: 11.2 mg/dL — ABNORMAL HIGH (ref 8.7–10.3)
Chloride: 104 mmol/L (ref 96–106)
GFR calc non Af Amer: 25 mL/min/{1.73_m2} — ABNORMAL LOW (ref >59)
GFR, EST AFRICAN AMERICAN: 29 mL/min/{1.73_m2} — AB (ref >59)
GLOBULIN, TOTAL: 2.1 g/dL (ref 1.5–4.5)
GLUCOSE: 108 mg/dL — AB (ref 65–99)
Potassium: 4.3 mmol/L (ref 3.5–5.2)
Sodium: 140 mmol/L (ref 134–144)
TOTAL PROTEIN: 6.8 g/dL (ref 6.0–8.5)

## 2017-12-04 LAB — H. PYLORI BREATH TEST: H pylori Breath Test: NEGATIVE

## 2017-12-04 LAB — TSH+T4F+T3FREE
FREE T4: 1.46 ng/dL (ref 0.82–1.77)
T3, Free: 1.5 pg/mL — ABNORMAL LOW (ref 2.0–4.4)
TSH: 1.37 u[IU]/mL (ref 0.450–4.500)

## 2017-12-04 LAB — URINE CULTURE: Organism ID, Bacteria: NO GROWTH

## 2017-12-05 ENCOUNTER — Encounter: Payer: Self-pay | Admitting: Family Medicine

## 2017-12-05 DIAGNOSIS — R3121 Asymptomatic microscopic hematuria: Secondary | ICD-10-CM | POA: Insufficient documentation

## 2017-12-05 DIAGNOSIS — R634 Abnormal weight loss: Secondary | ICD-10-CM | POA: Insufficient documentation

## 2017-12-05 DIAGNOSIS — N189 Chronic kidney disease, unspecified: Secondary | ICD-10-CM | POA: Insufficient documentation

## 2017-12-05 DIAGNOSIS — D631 Anemia in chronic kidney disease: Secondary | ICD-10-CM

## 2017-12-05 DIAGNOSIS — N184 Chronic kidney disease, stage 4 (severe): Secondary | ICD-10-CM | POA: Insufficient documentation

## 2017-12-05 DIAGNOSIS — K5904 Chronic idiopathic constipation: Secondary | ICD-10-CM | POA: Insufficient documentation

## 2017-12-06 ENCOUNTER — Encounter (HOSPITAL_COMMUNITY): Payer: Medicare Other

## 2017-12-06 MED ORDER — CLOTRIMAZOLE 10 MG MT TROC: 10.0000 mg | Freq: Every day | OROMUCOSAL | 0 refills | Status: DC

## 2017-12-09 ENCOUNTER — Encounter (HOSPITAL_COMMUNITY): Payer: Medicare Other

## 2017-12-09 ENCOUNTER — Encounter (HOSPITAL_COMMUNITY)
Admission: RE | Admit: 2017-12-09 | Discharge: 2017-12-09 | Disposition: A | Discharge status: Home / Self Care | Payer: Medicare Other | Source: Ambulatory Visit | Attending: Internal Medicine | Admitting: Internal Medicine

## 2017-12-09 DIAGNOSIS — Z79899 Other long term (current) drug therapy: Secondary | ICD-10-CM | POA: Diagnosis not present

## 2017-12-09 DIAGNOSIS — K219 Gastro-esophageal reflux disease without esophagitis: Secondary | ICD-10-CM | POA: Diagnosis not present

## 2017-12-09 DIAGNOSIS — Z7902 Long term (current) use of antithrombotics/antiplatelets: Secondary | ICD-10-CM | POA: Diagnosis not present

## 2017-12-09 DIAGNOSIS — I213 ST elevation (STEMI) myocardial infarction of unspecified site: Secondary | ICD-10-CM

## 2017-12-09 DIAGNOSIS — Z955 Presence of coronary angioplasty implant and graft: Secondary | ICD-10-CM | POA: Diagnosis not present

## 2017-12-09 DIAGNOSIS — Z7901 Long term (current) use of anticoagulants: Secondary | ICD-10-CM | POA: Diagnosis not present

## 2017-12-09 DIAGNOSIS — I10 Essential (primary) hypertension: Secondary | ICD-10-CM | POA: Diagnosis not present

## 2017-12-11 ENCOUNTER — Encounter (HOSPITAL_COMMUNITY): Payer: Medicare Other

## 2017-12-11 ENCOUNTER — Encounter (HOSPITAL_COMMUNITY)
Admission: RE | Admit: 2017-12-11 | Discharge: 2017-12-11 | Disposition: A | Discharge status: Home / Self Care | Payer: Medicare Other | Source: Ambulatory Visit | Attending: Internal Medicine | Admitting: Internal Medicine

## 2017-12-11 DIAGNOSIS — Z955 Presence of coronary angioplasty implant and graft: Secondary | ICD-10-CM | POA: Diagnosis not present

## 2017-12-11 DIAGNOSIS — I213 ST elevation (STEMI) myocardial infarction of unspecified site: Secondary | ICD-10-CM | POA: Diagnosis not present

## 2017-12-11 DIAGNOSIS — Z79899 Other long term (current) drug therapy: Secondary | ICD-10-CM | POA: Diagnosis not present

## 2017-12-11 DIAGNOSIS — Z7902 Long term (current) use of antithrombotics/antiplatelets: Secondary | ICD-10-CM | POA: Diagnosis not present

## 2017-12-11 DIAGNOSIS — Z7901 Long term (current) use of anticoagulants: Secondary | ICD-10-CM | POA: Diagnosis not present

## 2017-12-11 DIAGNOSIS — I10 Essential (primary) hypertension: Secondary | ICD-10-CM | POA: Diagnosis not present

## 2017-12-11 DIAGNOSIS — K219 Gastro-esophageal reflux disease without esophagitis: Secondary | ICD-10-CM | POA: Diagnosis not present

## 2017-12-12 NOTE — Progress Notes (Signed)
Cardiac Individual Treatment Plan  Patient Details  Name: [name] MRN: [id number] Date of Birth: 12/31/1934 Referring Provider:     CARDIAC REHAB PHASE II ORIENTATION from 10/10/2017 in [place]  Referring Provider  Bensimhon, Daniel      Initial Encounter Date:    CARDIAC REHAB PHASE II ORIENTATION from 10/10/2017 in [facility]  Date  10/10/17      Visit Diagnosis: Stented coronary artery  ST elevation myocardial infarction (STEMI), unspecified artery (HCC)  Patient's Home Medications on Admission:  Current Outpatient Medications:  .  anastrozole (ARIMIDEX) 1 MG tablet, TAKE ONE TABLET BY MOUTH ONCE DAILY, Disp: 90 tablet, Rfl: 4 .  apixaban (ELIQUIS) 2.5 MG TABS tablet, Take 1 tablet (2.5 mg total) by mouth 2 (two) times daily., Disp: 60 tablet, Rfl: 6 .  atorvastatin (LIPITOR) 80 MG tablet, Take 1 tablet (80 mg total) by mouth daily at 6 PM., Disp: 30 tablet, Rfl: 6 .  Calcium Carb-Cholecalciferol (CALCIUM + D3) 600-800 MG-UNIT TABS, Take 1 tablet by mouth daily. , Disp: , Rfl:  .  cholecalciferol (VITAMIN D) 1000 UNITS tablet, Take 1,000 Units by mouth daily. Over the counter, Disp: , Rfl:  .  citalopram (CELEXA) 10 MG tablet, Take 1 tablet (10 mg total) by mouth daily., Disp: 30 tablet, Rfl: 6 .  clopidogrel (PLAVIX) 75 MG tablet, Take 1 tablet (75 mg total) by mouth daily., Disp: 30 tablet, Rfl: 6 .  clotrimazole (MYCELEX) 10 MG troche, Take 1 tablet (10 mg total) by mouth 5 (five) times daily. Slowly dissolve in mouth, do not chew or swallow whole. Use ALL lozenges, Disp: 70 tablet, Rfl: 0 .  co-enzyme Q-10 30 MG capsule, Take 30 mg by mouth 3 (three) times daily., Disp: , Rfl:  .  fish oil-omega-3 fatty acids 1000 MG capsule, Take 1 g by mouth daily., Disp: , Rfl:  .  furosemide (LASIX) 20 MG tablet, Take 1 tablet (20 mg total) by mouth 2 (two) times a week. Take every Monday and Thursday. (Patient not  taking: Reported on 12/03/2017), Disp: 15 tablet, Rfl: 6 .  losartan (COZAAR) 25 MG tablet, Take 1 tablet (25 mg total) by mouth 2 (two) times daily., Disp: 60 tablet, Rfl: 6 .  magnesium gluconate (MAGONATE) 500 MG tablet, Take 500 mg by mouth 2 (two) times daily., Disp: , Rfl:  .  Multiple Vitamin (MULTIVITAMIN) tablet, Take 1 tablet by mouth daily., Disp: , Rfl:  .  nitroGLYCERIN (NITROSTAT) 0.3 MG SL tablet, Place 1 tablet (0.3 mg total) under the tongue every 5 (five) minutes as needed for chest pain., Disp: 90 tablet, Rfl: 12 .  spironolactone (ALDACTONE) 25 MG tablet, Take 1 tablet (25 mg total) by mouth daily., Disp: 30 tablet, Rfl: 6  Past Medical History: Past Medical History:  Diagnosis Date  . Anxiety   . Breast cancer of lower-outer quadrant of left female breast ([place]) 10/11/2014   treated with lumpectomy and radiation  . Coronary artery disease 07/27/2017  . GERD (gastroesophageal reflux disease)   . Hypertension   . Myocardial infarction ([place]) 07/27/2017   STEMI of anterolateral wall. Cath w/ stent and cabg by Dr. [place]ique  . Osteoporosis 10/20/2014  . Personal history of radiation therapy 2016   treatment of breast cancer  . PONV (postoperative nausea and vomiting)   . PUD (peptic ulcer disease)   . Pure hypercholesterolemia 04/08/2014  . Stone, kidney     Tobacco Use:  Social History   Tobacco Use  Smoking Status Never Smoker  Smokeless Tobacco Never Used    Labs: Recent Review Flowsheet Data    Labs for ITP Cardiac and Pulmonary Rehab Latest Ref Rng & Units 08/01/2017 08/02/2017 08/03/2017 08/04/2017 08/05/2017   Cholestrol 0 - 200 mg/dL - - - - -   LDLCALC 0 - 99 mg/dL - - - - -   HDL >40 mg/dL - - - - -   Trlycerides <150 mg/dL - - - - -   Hemoglobin A1c 4.8 - 5.6 % - - - - -   PHART 7.350 - 7.450 - - - - -   PCO2ART 32.0 - 48.0 mmHg - - - - -   HCO3 20.0 - 28.0 mmol/L - - - - -   TCO2 22 - 32 mmol/L - - - - -   ACIDBASEDEF 0.0 - 2.0 mmol/L - - - - -   O2SAT % 59.3  53.6 56.3 65.5 61.5      Capillary Blood Glucose: Lab Results  Component Value Date   GLUCAP 117 (H) 08/05/2017   GLUCAP 122 (H) 08/05/2017   GLUCAP 103 (H) 08/05/2017   GLUCAP 109 (H) 08/05/2017   GLUCAP 147 (H) 08/04/2017     Exercise Target Goals:    Exercise Program Goal: Individual exercise prescription set using results from initial 6 min walk test and THRR while considering  patient's activity barriers and safety.   Exercise Prescription Goal: Initial exercise prescription builds to 30-45 minutes a day of aerobic activity, 2-3 days per week.  Home exercise guidelines will be given to patient during program as part of exercise prescription that the participant will acknowledge.  Activity Barriers & Risk Stratification: Activity Barriers & Cardiac Risk Stratification - 10/10/17 0933      Activity Barriers & Cardiac Risk Stratification   Activity Barriers  Muscular Weakness;Shortness of Breath;Deconditioning;Back Problems    Cardiac Risk Stratification  High       6 Minute Walk: 6 Minute Walk    Row Name 10/10/17 1212         6 Minute Walk   Phase  Initial     Distance  1200 feet     Walk Time  6 minutes     # of Rest Breaks  0     MPH  2.3     METS  2     RPE  13     VO2 Peak  7.1     Symptoms  Yes (comment)     Comments  leg fatigue     Resting HR  53 bpm     Resting BP  112/52     Resting Oxygen Saturation   100 %     Exercise Oxygen Saturation  during 6 min walk  98 %     Max Ex. HR  66 bpm     Max Ex. BP  122/60     2 Minute Post BP  110/60        Oxygen Initial Assessment:   Oxygen Re-Evaluation:   Oxygen Discharge (Final Oxygen Re-Evaluation):   Initial Exercise Prescription: Initial Exercise Prescription - 10/10/17 1200      Date of Initial Exercise RX and Referring Provider   Date  10/10/17    Referring Provider  Bensimhon, Daniel      Recumbant Bike   Level  1.5    RPM  60    Watts  10    Minutes  10    METs  1.7       NuStep   Level  2    SPM  60    Minutes  10    METs  1.5      Track   Laps  7    Minutes  10    METs  2.23      Prescription Details   Frequency (times per week)  3    Duration  Progress to 30 minutes of continuous aerobic without signs/symptoms of physical distress      Intensity   THRR 40-80% of Max Heartrate  56-112    Ratings of Perceived Exertion  11-13    Perceived Dyspnea  0-4      Progression   Progression  Continue to progress workloads to maintain intensity without signs/symptoms of physical distress.      Resistance Training   Training Prescription  Yes    Weight  1lbs    Reps  10-15       Perform Capillary Blood Glucose checks as needed.  Exercise Prescription Changes: Exercise Prescription Changes    Row Name 10/16/17 1508 10/21/17 1452 11/04/17 1446 11/18/17 1448 12/02/17 1452     Response to Exercise   Blood Pressure (Admit)  132/72  126/68  130/70  122/60  122/68   Blood Pressure (Exercise)  138/72  126/80  122/62  122/60  122/68   Blood Pressure (Exit)  126/80  128/82  122/70  124/70  122/68   Heart Rate (Admit)  57 bpm  46 bpm  55 bpm  66 bpm  73 bpm   Heart Rate (Exercise)  76 bpm  68 bpm  89 bpm  80 bpm  89 bpm   Heart Rate (Exit)  57 bpm  55 bpm  61 bpm  60 bpm  73 bpm   Rating of Perceived Exertion (Exercise)  13  12  12  13  13    Symptoms  none  none  none  none  none   Duration  Progress to 30 minutes of  aerobic without signs/symptoms of physical distress  Progress to 30 minutes of  aerobic without signs/symptoms of physical distress  Progress to 30 minutes of  aerobic without signs/symptoms of physical distress  Progress to 30 minutes of  aerobic without signs/symptoms of physical distress  Progress to 30 minutes of  aerobic without signs/symptoms of physical distress   Intensity  THRR unchanged  THRR unchanged  THRR unchanged  THRR unchanged  THRR unchanged     Progression   Progression  Continue to progress workloads to maintain intensity  without signs/symptoms of physical distress.  Continue to progress workloads to maintain intensity without signs/symptoms of physical distress.  Continue to progress workloads to maintain intensity without signs/symptoms of physical distress.  Continue to progress workloads to maintain intensity without signs/symptoms of physical distress.  Continue to progress workloads to maintain intensity without signs/symptoms of physical distress.   Average METs  2.1  1.8  1.9  2.1  2.3     Resistance Training   Training Prescription  No Relaxation day, no weights.  Yes  Yes  Yes  Yes   Weight  -  1lbs  1lbs  1lbs  2lbs   Reps  -  10-15  10-15  10-15  10-15   Time  -  10 Minutes  10 Minutes  10 Minutes  10 Minutes     Interval Training   Interval Training  No  No  No  No  No     Recumbant Bike   Level  1.5  1.5  1.5  2.5  2.5   Minutes  10  10  10  10  10    METs  -  1.6  1.7  2  -     NuStep   Level  2  2  2  3  3    SPM  60  60  60  60  60   Minutes  10  10  10  10  10    METs  1.9  1.9  1.8  1.9  2.2     Track   Laps  7  6  7  8  8    Minutes  10  10  10  10  10    METs  2.22  2.03  2.23  2.39  2.39     Home Exercise Plan   Plans to continue exercise at  -  -  Home (comment)  Home (comment)  Home (comment)   Frequency  -  -  Add 1 additional day to program exercise sessions.  Add 1 additional day to program exercise sessions.  Add 1 additional day to program exercise sessions.   Initial Home Exercises Provided  -  -  11/04/17  11/04/17  11/04/17      Exercise Comments: Exercise Comments    Row Name 10/16/17 1508 10/21/17 1452 11/04/17 1500 11/18/17 1535 12/02/17 1541   Exercise Comments  Patient off to a good start with exercise.  Reviewed METs with patient.  Reviewed home exercise guidelines, METs, and goals with patient.  Reviewed METs with patient.  Reviewed METs and goals with patient.      Exercise Goals and Review: Exercise Goals    Row Name 10/10/17 0934              Exercise Goals   Increase Physical Activity  Yes       Intervention  Provide advice, education, support and counseling about physical activity/exercise needs.;Develop an individualized exercise prescription for aerobic and resistive training based on initial evaluation findings, risk stratification, comorbidities and participant's personal goals.       Expected Outcomes  Short Term: Attend rehab on a regular basis to increase amount of physical activity.;Long Term: Add in home exercise to make exercise part of routine and to increase amount of physical activity.;Long Term: Exercising regularly at least 3-5 days a week.       Increase Strength and Stamina  Yes get back to gardening, household chores, cooking and baking       Intervention  Provide advice, education, support and counseling about physical activity/exercise needs.;Develop an individualized exercise prescription for aerobic and resistive training based on initial evaluation findings, risk stratification, comorbidities and participant's personal goals.       Expected Outcomes  Short Term: Increase workloads from initial exercise prescription for resistance, speed, and METs.;Short Term: Perform resistance training exercises routinely during rehab and add in resistance training at home;Long Term: Improve cardiorespiratory fitness, muscular endurance and strength as measured by increased METs and functional capacity (6MWT)       Able to understand and use rate of perceived exertion (RPE) scale  Yes       Intervention  Provide education and explanation on how to use RPE scale       Expected Outcomes  Short Term: Able to use RPE daily in rehab to express subjective intensity level;Long Term:  Able  to use RPE to guide intensity level when exercising independently       Knowledge and understanding of Target Heart Rate Range (THRR)  Yes       Intervention  Provide education and explanation of THRR including how the numbers were predicted and where they  are located for reference       Expected Outcomes  Short Term: Able to state/look up THRR;Long Term: Able to use THRR to govern intensity when exercising independently;Short Term: Able to use daily as guideline for intensity in rehab       Able to check pulse independently  Yes       Intervention  Provide education and demonstration on how to check pulse in carotid and radial arteries.;Review the importance of being able to check your own pulse for safety during independent exercise       Expected Outcomes  Short Term: Able to explain why pulse checking is important during independent exercise;Long Term: Able to check pulse independently and accurately       Understanding of Exercise Prescription  Yes       Intervention  Provide education, explanation, and written materials on patient's individual exercise prescription       Expected Outcomes  Short Term: Able to explain program exercise prescription;Long Term: Able to explain home exercise prescription to exercise independently          Exercise Goals Re-Evaluation : Exercise Goals Re-Evaluation    Row Name 10/16/17 1648 11/04/17 1446 12/02/17 1541         Exercise Goal Re-Evaluation   Exercise Goals Review  Increase Physical Activity;Able to understand and use rate of perceived exertion (RPE) scale  Understanding of Exercise Prescription;Increase Physical Activity;Knowledge and understanding of Target Heart Rate Range (THRR)  Increase Physical Activity     Comments  Patient tolerated low intensity exercise without c/o. Patient understands and is able to use the RPE scale appropriately.  Reviewed home exercise guidelines with patient including THRR, RPE scale, and endpoints for exercise.   Patient states that she is walking 5 minutes 1-2 days/week. Encouraged pt to increase to 5 minutes, 3x's/day, 1-2 days/week, and pt is agreeable to this. Pt states that she feels a little stronger.     Expected Outcomes  Increase workloads as tolerated to help  achieve personal health and fitness goals.  Patient will add 1 additional day walking in addition to exercise at [facility].  Patient will increase walking duration at home from 5 minutes one time to 5 minutes 3x's, 1-2 days/week.         Discharge Exercise Prescription (Final Exercise Prescription Changes): Exercise Prescription Changes - 12/02/17 1452      Response to Exercise   Blood Pressure (Admit)  122/68    Blood Pressure (Exercise)  122/68    Blood Pressure (Exit)  122/68    Heart Rate (Admit)  73 bpm    Heart Rate (Exercise)  89 bpm    Heart Rate (Exit)  73 bpm    Rating of Perceived Exertion (Exercise)  13    Symptoms  none    Duration  Progress to 30 minutes of  aerobic without signs/symptoms of physical distress    Intensity  THRR unchanged      Progression   Progression  Continue to progress workloads to maintain intensity without signs/symptoms of physical distress.    Average METs  2.3      Resistance Training   Training Prescription  Yes    Weight  2lbs    Reps  10-15    Time  10 Minutes      Interval Training   Interval Training  No      Recumbant Bike   Level  2.5    Minutes  10      NuStep   Level  3    SPM  60    Minutes  10    METs  2.2      Track   Laps  8    Minutes  10    METs  2.39      Home Exercise Plan   Plans to continue exercise at  Home (comment)    Frequency  Add 1 additional day to program exercise sessions.    Initial Home Exercises Provided  11/04/17       Nutrition:  Target Goals: Understanding of nutrition guidelines, daily intake of sodium 1500mg , cholesterol 200mg , calories 30% from fat and 7% or less from saturated fats, daily to have 5 or more servings of fruits and vegetables.  Biometrics: Pre Biometrics - 10/10/17 1220      Pre Biometrics   Height  5\' 8"  (1.727 m)    Weight  158 lb 15.2 oz (72.1 kg)    Waist Circumference  31.5 inches    Hip Circumference  39.5 inches    Waist to Hip Ratio  0.8 %    BMI  (Calculated)  24.17    Triceps Skinfold  24 mm    % Body Fat  35.5 %    Grip Strength  25 kg    Flexibility  0 in    Single Leg Stand  4.97 seconds        Nutrition Therapy Plan and Nutrition Goals: Nutrition Therapy & Goals - 10/10/17 1222      Nutrition Therapy   Diet  General, Healthful      Personal Nutrition Goals   Nutrition Goal  Pt to liberalize her diet until her appetite improves.     Personal Goal #2  Pt to identify food quantities necessary to achieve weight maintenance of ~160 lb at graduation from cardiac rehab.       Intervention Plan   Intervention  Prescribe, educate and counsel regarding individualized specific dietary modifications aiming towards targeted core components such as weight, hypertension, lipid management, diabetes, heart failure and other comorbidities.    Expected Outcomes  Short Term Goal: Understand basic principles of dietary content, such as calories, fat, sodium, cholesterol and nutrients.;Long Term Goal: Adherence to prescribed nutrition plan.       Nutrition Assessments: Nutrition Assessments - 10/10/17 1223      MEDFICTS Scores   Pre Score  21       Nutrition Goals Re-Evaluation:   Nutrition Goals Re-Evaluation:   Nutrition Goals Discharge (Final Nutrition Goals Re-Evaluation):   Psychosocial: Target Goals: Acknowledge presence or absence of significant depression and/or stress, maximize coping skills, provide positive support system. Participant is able to verbalize types and ability to use techniques and skills needed for reducing stress and depression.  Initial Review & Psychosocial Screening: Initial Psych Review & Screening - 10/10/17 1342      Initial Review   Current issues with  None Identified      Family Dynamics   Good Support System?  Yes [name] reports her spouse, family, and friends as sources of support for her.      Barriers   Psychosocial barriers to participate in program  There are no  identifiable  barriers or psychosocial needs.      Screening Interventions   Interventions  Encouraged to exercise       Quality of Life Scores: Quality of Life - 10/10/17 1221      Quality of Life Scores   Health/Function Pre  24.19 %    Socioeconomic Pre  27.86 %    Psych/Spiritual Pre  27.86 %    Family Pre  30 %    GLOBAL Pre  26.7 %      Scores of 19 and below usually indicate a poorer quality of life in these areas.  A difference of  2-3 points is a clinically meaningful difference.  A difference of 2-3 points in the total score of the Quality of Life Index has been associated with significant improvement in overall quality of life, self-image, physical symptoms, and general health in studies assessing change in quality of life.  PHQ-9: Recent Review Flowsheet Data    Depression screen [facility] 2/9 12/03/2017 10/14/2017 07/11/2017 02/28/2017 11/05/2016   Decreased Interest 0 0 0 0 0   Down, Depressed, Hopeless 0 0 0 0 0   PHQ - 2 Score 0 0 0 0 0     Interpretation of Total Score  Total Score Depression Severity:  1-4 = Minimal depression, 5-9 = Mild depression, 10-14 = Moderate depression, 15-19 = Moderately severe depression, 20-27 = Severe depression   Psychosocial Evaluation and Intervention:   Psychosocial Re-Evaluation: Psychosocial Re-Evaluation    [place] Name 11/14/17 1431 12/12/17 1555           Psychosocial Re-Evaluation   Current issues with  None Identified  None Identified      Interventions  Encouraged to attend Cardiac Rehabilitation for the exercise  Encouraged to attend Cardiac Rehabilitation for the exercise      Continue Psychosocial Services   No Follow up required  No Follow up required         Psychosocial Discharge (Final Psychosocial Re-Evaluation): Psychosocial Re-Evaluation - 12/12/17 1555      Psychosocial Re-Evaluation   Current issues with  None Identified    Interventions  Encouraged to attend Cardiac Rehabilitation for the exercise    Continue Psychosocial  Services   No Follow up required       Vocational Rehabilitation: Provide vocational rehab assistance to qualifying candidates.   Vocational Rehab Evaluation & Intervention: Vocational Rehab - 10/10/17 1340      Initial Vocational Rehab Evaluation & Intervention   Assessment shows need for Vocational Rehabilitation  No       Education: Education Goals: Education classes will be provided on a weekly basis, covering required topics. Participant will state understanding/return demonstration of topics presented.  Learning Barriers/Preferences: Learning Barriers/Preferences - 10/10/17 0933      Learning Barriers/Preferences   Learning Barriers  Sight    Learning Preferences  Skilled Demonstration;Verbal Instruction;Video;Written Material;Pictoral       Education Topics: Count Your Pulse:  -Group instruction provided by verbal instruction, demonstration, patient participation and written materials to support subject.  Instructors address importance of being able to find your pulse and how to count your pulse when at home without a heart monitor.  Patients get hands on experience counting their pulse with staff help and individually.   CARDIAC REHAB PHASE II EXERCISE from 12/11/2017 in [place]  Date  11/29/17  Educator  Jiles Garter  Instruction Review Code  2- Demonstrated Understanding      Heart Attack, Angina,  and Risk Factor Modification:  -Group instruction provided by verbal instruction, video, and written materials to support subject.  Instructors address signs and symptoms of angina and heart attacks.    Also discuss risk factors for heart disease and how to make changes to improve heart health risk factors.   CARDIAC REHAB PHASE II EXERCISE from 12/11/2017 in [place]  Date  11/20/17  Instruction Review Code  2- Demonstrated Understanding      Functional Fitness:  -Group instruction provided by verbal  instruction, demonstration, patient participation, and written materials to support subject.  Instructors address safety measures for doing things around the house.  Discuss how to get up and down off the floor, how to pick things up properly, how to safely get out of a chair without assistance, and balance training.   CARDIAC REHAB PHASE II EXERCISE from 12/11/2017 in [place]  Date  10/25/17  Educator  [place]  Instruction Review Code  2- Demonstrated Understanding      Meditation and Mindfulness:  -Group instruction provided by verbal instruction, patient participation, and written materials to support subject.  Instructor addresses importance of mindfulness and meditation practice to help reduce stress and improve awareness.  Instructor also leads participants through a meditation exercise.    Stretching for Flexibility and Mobility:  -Group instruction provided by verbal instruction, patient participation, and written materials to support subject.  Instructors lead participants through series of stretches that are designed to increase flexibility thus improving mobility.  These stretches are additional exercise for major muscle groups that are typically performed during regular warm up and cool down.   Hands Only CPR:  -Group verbal, video, and participation provides a basic overview of AHA guidelines for community CPR. Role-play of emergencies allow participants the opportunity to practice calling for help and chest compression technique with discussion of AED use.   Hypertension: -Group verbal and written instruction that provides a basic overview of hypertension including the most recent diagnostic guidelines, risk factor reduction with self-care instructions and medication management.    Nutrition I class: Heart Healthy Eating:  -Group instruction provided by PowerPoint slides, verbal discussion, and written materials to support subject matter.  The instructor gives an explanation and review of the Therapeutic Lifestyle Changes diet recommendations, which includes a discussion on lipid goals, dietary fat, sodium, fiber, plant stanol/sterol esters, sugar, and the components of a well-balanced, healthy diet.   Nutrition II class: Lifestyle Skills:  -Group instruction provided by PowerPoint slides, verbal discussion, and written materials to support subject matter. The instructor gives an explanation and review of label reading, grocery shopping for heart health, heart healthy recipe modifications, and ways to make healthier choices when eating out.   Diabetes Question & Answer:  -Group instruction provided by PowerPoint slides, verbal discussion, and written materials to support subject matter. The instructor gives an explanation and review of diabetes co-morbidities, pre- and post-prandial blood glucose goals, pre-exercise blood glucose goals, signs, symptoms, and treatment of hypoglycemia and hyperglycemia, and foot care basics.   Diabetes Blitz:  -Group instruction provided by PowerPoint slides, verbal discussion, and written materials to support subject matter. The instructor gives an explanation and review of the physiology behind type 1 and type 2 diabetes, diabetes medications and rational behind using different medications, pre- and post-prandial blood glucose recommendations and Hemoglobin A1c goals, diabetes diet, and exercise including blood glucose guidelines for exercising safely.    Portion Distortion:  -Group instruction provided  by PowerPoint slides, verbal discussion, written materials, and food models to support subject matter. The instructor gives an explanation of serving size versus portion size, changes in portions sizes over the last 20 years, and what consists of a serving from each food group.   CARDIAC REHAB PHASE II EXERCISE from 12/11/2017 in [place]  Date  10/23/17  Educator  RD   Instruction Review Code  2- Demonstrated Understanding      Stress Management:  -Group instruction provided by verbal instruction, video, and written materials to support subject matter.  Instructors review role of stress in heart disease and how to cope with stress positively.     CARDIAC REHAB PHASE II EXERCISE from 12/11/2017 in [place]  Date  10/30/17  Educator  Jiles Garter, RN  Instruction Review Code  2- Demonstrated Understanding      Exercising on Your Own:  -Group instruction provided by verbal instruction, power point, and written materials to support subject.  Instructors discuss benefits of exercise, components of exercise, frequency and intensity of exercise, and end points for exercise.  Also discuss use of nitroglycerin and activating EMS.  Review options of places to exercise outside of rehab.  Review guidelines for sex with heart disease.   CARDIAC REHAB PHASE II EXERCISE from 12/11/2017 in [place]  Date  11/27/17  Educator  EP  Instruction Review Code  2- Demonstrated Understanding      Cardiac Drugs I:  -Group instruction provided by verbal instruction and written materials to support subject.  Instructor reviews cardiac drug classes: antiplatelets, anticoagulants, beta blockers, and statins.  Instructor discusses reasons, side effects, and lifestyle considerations for each drug class.   CARDIAC REHAB PHASE II EXERCISE from 12/11/2017 in [place]  Date  12/11/17  Educator  Pharmacist  Instruction Review Code  2- Demonstrated Understanding      Cardiac Drugs II:  -Group instruction provided by verbal instruction and written materials to support subject.  Instructor reviews cardiac drug classes: angiotensin converting enzyme inhibitors (ACE-I), angiotensin II receptor blockers (ARBs), nitrates, and calcium channel blockers.  Instructor discusses reasons, side  effects, and lifestyle considerations for each drug class.   CARDIAC REHAB PHASE II EXERCISE from 12/11/2017 in [place]  Date  11/13/17  Educator  Pharmacist  Instruction Review Code  2- Demonstrated Understanding      Anatomy and Physiology of the Circulatory System:  Group verbal and written instruction and models provide basic cardiac anatomy and physiology, with the coronary electrical and arterial systems. Review of: AMI, Angina, Valve disease, Heart Failure, Peripheral Artery Disease, Cardiac Arrhythmia, Pacemakers, and the ICD.   Other Education:  -Group or individual verbal, written, or video instructions that support the educational goals of the cardiac rehab program.   Holiday Eating Survival Tips:  -Group instruction provided by PowerPoint slides, verbal discussion, and written materials to support subject matter. The instructor gives patients tips, tricks, and techniques to help them not only survive but enjoy the holidays despite the onslaught of food that accompanies the holidays.   Knowledge Questionnaire Score: Knowledge Questionnaire Score - 10/10/17 1210      Knowledge Questionnaire Score   Pre Score  17/24       Core Components/Risk Factors/Patient Goals at Admission: Personal Goals and Risk Factors at Admission - 11/14/17 1435      Core Components/Risk Factors/Patient Goals on Admission  Expected Outcomes  Improve functional capacity of life;Short term: Attendance in program 2-3 days a week with increased exercise capacity. Reported lower sodium intake. Reported increased fruit and vegetable intake. Reports medication compliance.;Short term: Daily weights obtained and reported for increase. Utilizing diuretic protocols set by physician.;Long term: Adoption of self-care skills and reduction of barriers for early signs and symptoms recognition and intervention leading to self-care maintenance.       Core Components/Risk  Factors/Patient Goals Review:  Goals and Risk Factor Review    Row Name 11/14/17 1425 12/12/17 1555           Core Components/Risk Factors/Patient Goals Review   Personal Goals Review  Heart Failure;Hypertension  Heart Failure;Hypertension      Review  Ms. Magaline's vital signs have been stable at cardiac rehab. Ms [name] reports feeling stronger since she has been partcipating in phase 2 cardiac rehab  [name]'s vital signs have been stable at cardiac rehab. Ms [name] reports feeling stronger since she has been partcipating in phase 2 cardiac rehab      Expected [name] will continue to partcipate in phase 2 cardiac rehab and take her medications as prescribed.  [name] will continue to partcipate in phase 2 cardiac rehab and take her medications as prescribed.         Core Components/Risk Factors/Patient Goals at Discharge (Final Review):  Goals and Risk Factor Review - 12/12/17 1555      Core Components/Risk Factors/Patient Goals Review   Personal Goals Review  Heart Failure;Hypertension    Review  [name]'s vital signs have been stable at cardiac rehab. Ms [name] reports feeling stronger since she has been partcipating in phase 2 cardiac rehab    Expected [name] will continue to partcipate in phase 2 cardiac rehab and take her medications as prescribed.       ITP Comments: ITP Comments    Row Name 10/10/17 1209 10/17/17 1327 11/14/17 1424 12/12/17 1555     ITP Comments  Dr. Fransico Him, Medical Director   30 day ITP comment. Patient started exercise at cardiac rehab today.  30 day ITP comment. Patient with good attendance and partcipation in phase 2 cardiac rehab  30 day ITP comment. Patient with good attendance and partcipation in phase 2 cardiac rehab       Comments: See ITP comments. Will continue to monitor weight.[name], RN,BSN 12/12/2017 3:59 PM

## 2017-12-13 ENCOUNTER — Encounter (HOSPITAL_COMMUNITY)
Admission: RE | Admit: 2017-12-13 | Discharge: 2017-12-13 | Disposition: A | Discharge status: Home / Self Care | Payer: Medicare Other | Source: Ambulatory Visit | Attending: Internal Medicine | Admitting: Internal Medicine

## 2017-12-13 ENCOUNTER — Encounter (HOSPITAL_COMMUNITY): Payer: Medicare Other

## 2017-12-13 DIAGNOSIS — I213 ST elevation (STEMI) myocardial infarction of unspecified site: Secondary | ICD-10-CM

## 2017-12-13 DIAGNOSIS — I10 Essential (primary) hypertension: Secondary | ICD-10-CM | POA: Diagnosis not present

## 2017-12-13 DIAGNOSIS — Z79899 Other long term (current) drug therapy: Secondary | ICD-10-CM | POA: Diagnosis not present

## 2017-12-13 DIAGNOSIS — Z955 Presence of coronary angioplasty implant and graft: Secondary | ICD-10-CM

## 2017-12-13 DIAGNOSIS — Z7901 Long term (current) use of anticoagulants: Secondary | ICD-10-CM | POA: Diagnosis not present

## 2017-12-13 DIAGNOSIS — K219 Gastro-esophageal reflux disease without esophagitis: Secondary | ICD-10-CM | POA: Diagnosis not present

## 2017-12-13 DIAGNOSIS — Z7902 Long term (current) use of antithrombotics/antiplatelets: Secondary | ICD-10-CM | POA: Diagnosis not present

## 2017-12-16 ENCOUNTER — Encounter (HOSPITAL_COMMUNITY)
Admission: RE | Admit: 2017-12-16 | Discharge: 2017-12-16 | Disposition: A | Discharge status: Home / Self Care | Payer: Medicare Other | Source: Ambulatory Visit | Attending: Internal Medicine | Admitting: Internal Medicine

## 2017-12-16 ENCOUNTER — Encounter (HOSPITAL_COMMUNITY): Payer: Medicare Other

## 2017-12-16 DIAGNOSIS — Z955 Presence of coronary angioplasty implant and graft: Secondary | ICD-10-CM | POA: Diagnosis not present

## 2017-12-16 DIAGNOSIS — Z7902 Long term (current) use of antithrombotics/antiplatelets: Secondary | ICD-10-CM | POA: Diagnosis not present

## 2017-12-16 DIAGNOSIS — I10 Essential (primary) hypertension: Secondary | ICD-10-CM | POA: Diagnosis not present

## 2017-12-16 DIAGNOSIS — I213 ST elevation (STEMI) myocardial infarction of unspecified site: Secondary | ICD-10-CM

## 2017-12-16 DIAGNOSIS — Z7901 Long term (current) use of anticoagulants: Secondary | ICD-10-CM | POA: Diagnosis not present

## 2017-12-16 DIAGNOSIS — K219 Gastro-esophageal reflux disease without esophagitis: Secondary | ICD-10-CM | POA: Diagnosis not present

## 2017-12-16 DIAGNOSIS — Z79899 Other long term (current) drug therapy: Secondary | ICD-10-CM | POA: Diagnosis not present

## 2017-12-17 NOTE — Progress Notes (Signed)
[name] 83 y.o. female Nutrition Note Pt seen today with an 11 lb weight loss since may. Pt shared her appetite remains poor. Pt shared she discussed appetite and taste changes with PCP. Pt continues to drink 1-2 Boost/day, will nutritional supplement in place of meal. Discussed using boost between meals as a snack, and having 3 meals daily. Discussed increasing protein and calories consumed by liberalizing meal plan until appetite improves. Educated pt on additional ways to maximize nutrition intake while appetite decreased. Pt has maintained her wt since starting rehab. Pt expressed understanding of the information reviewed.  Lab Results  Component Value Date   HGBA1C 5.8 (H) 07/28/2017    Wt Readings from Last 6 Encounters:  12/03/17 147 lb (66.7 kg)  10/31/17 149 lb 3.2 oz (67.7 kg)  10/10/17 158 lb 15.2 oz (72.1 kg)  09/19/17 160 lb 1.9 oz (72.6 kg)  08/29/17 163 lb 12.8 oz (74.3 kg)  08/15/17 166 lb 3.2 oz (75.4 kg)    Nutrition Diagnosis ? Food-and nutrition-related knowledge deficit related to lack of exposure to information as related to diagnosis of: ? CVD ?Pre-DM  Nutrition Intervention ? Pt's individual nutrition plan reviewed with pt. ? Continue client-centered nutrition education by RD, as part of interdisciplinary care.  Goal(s)  ? Pt to identify and increase food sources that are calorically dense and/or high in protein ? Pt to use nutritional supplement between meals  Plan:  Pt to attend nutrition classes ? Nutrition I ? Nutrition II ? Portion Distortion  Will provide client-centered nutrition education as part of interdisciplinary care.   Monitor and evaluate progress toward nutrition goal with team.   Laurina Bustle, MS, RD, LDN 12/17/2017 8:16 AM

## 2017-12-18 ENCOUNTER — Encounter (HOSPITAL_COMMUNITY): Payer: Medicare Other

## 2017-12-18 ENCOUNTER — Encounter (HOSPITAL_COMMUNITY)
Admission: RE | Admit: 2017-12-18 | Discharge: 2017-12-18 | Disposition: A | Discharge status: Home / Self Care | Payer: Medicare Other | Source: Ambulatory Visit | Attending: Internal Medicine | Admitting: Internal Medicine

## 2017-12-18 DIAGNOSIS — Z7902 Long term (current) use of antithrombotics/antiplatelets: Secondary | ICD-10-CM | POA: Diagnosis not present

## 2017-12-18 DIAGNOSIS — Z955 Presence of coronary angioplasty implant and graft: Secondary | ICD-10-CM | POA: Diagnosis not present

## 2017-12-18 DIAGNOSIS — Z79899 Other long term (current) drug therapy: Secondary | ICD-10-CM | POA: Diagnosis not present

## 2017-12-18 DIAGNOSIS — Z7901 Long term (current) use of anticoagulants: Secondary | ICD-10-CM | POA: Diagnosis not present

## 2017-12-18 DIAGNOSIS — I213 ST elevation (STEMI) myocardial infarction of unspecified site: Secondary | ICD-10-CM | POA: Diagnosis not present

## 2017-12-18 DIAGNOSIS — K219 Gastro-esophageal reflux disease without esophagitis: Secondary | ICD-10-CM | POA: Diagnosis not present

## 2017-12-18 DIAGNOSIS — I10 Essential (primary) hypertension: Secondary | ICD-10-CM | POA: Diagnosis not present

## 2017-12-19 ENCOUNTER — Encounter: Payer: Medicare Other | Admitting: Family Medicine

## 2017-12-20 ENCOUNTER — Encounter (HOSPITAL_COMMUNITY): Payer: Medicare Other

## 2017-12-20 ENCOUNTER — Encounter (HOSPITAL_COMMUNITY)
Admission: RE | Admit: 2017-12-20 | Discharge: 2017-12-20 | Disposition: A | Discharge status: Home / Self Care | Payer: Medicare Other | Source: Ambulatory Visit | Attending: Internal Medicine | Admitting: Internal Medicine

## 2017-12-20 DIAGNOSIS — Z7902 Long term (current) use of antithrombotics/antiplatelets: Secondary | ICD-10-CM | POA: Diagnosis not present

## 2017-12-20 DIAGNOSIS — I213 ST elevation (STEMI) myocardial infarction of unspecified site: Secondary | ICD-10-CM | POA: Diagnosis not present

## 2017-12-20 DIAGNOSIS — I10 Essential (primary) hypertension: Secondary | ICD-10-CM | POA: Diagnosis not present

## 2017-12-20 DIAGNOSIS — Z79899 Other long term (current) drug therapy: Secondary | ICD-10-CM | POA: Diagnosis not present

## 2017-12-20 DIAGNOSIS — Z7901 Long term (current) use of anticoagulants: Secondary | ICD-10-CM | POA: Diagnosis not present

## 2017-12-20 DIAGNOSIS — K219 Gastro-esophageal reflux disease without esophagitis: Secondary | ICD-10-CM | POA: Diagnosis not present

## 2017-12-20 DIAGNOSIS — Z955 Presence of coronary angioplasty implant and graft: Secondary | ICD-10-CM

## 2017-12-23 ENCOUNTER — Encounter (HOSPITAL_COMMUNITY): Payer: Medicare Other

## 2017-12-23 ENCOUNTER — Encounter (HOSPITAL_COMMUNITY): Payer: Self-pay | Admitting: Internal Medicine

## 2017-12-23 ENCOUNTER — Ambulatory Visit (HOSPITAL_BASED_OUTPATIENT_CLINIC_OR_DEPARTMENT_OTHER)
Admission: RE | Admit: 2017-12-23 | Discharge: 2017-12-23 | Disposition: A | Discharge status: Home / Self Care | Payer: Medicare Other | Source: Ambulatory Visit | Attending: Internal Medicine | Admitting: Internal Medicine

## 2017-12-23 ENCOUNTER — Encounter (HOSPITAL_COMMUNITY)
Admission: RE | Admit: 2017-12-23 | Discharge: 2017-12-23 | Disposition: A | Discharge status: Home / Self Care | Payer: Medicare Other | Source: Ambulatory Visit | Attending: Internal Medicine | Admitting: Internal Medicine

## 2017-12-23 ENCOUNTER — Ambulatory Visit (HOSPITAL_COMMUNITY)
Admission: RE | Admit: 2017-12-23 | Discharge: 2017-12-23 | Disposition: A | Discharge status: Home / Self Care | Payer: Medicare Other | Source: Ambulatory Visit | Attending: Internal Medicine | Admitting: Internal Medicine

## 2017-12-23 ENCOUNTER — Other Ambulatory Visit: Payer: Self-pay

## 2017-12-23 VITALS — BP 146/52 | HR 65 | Wt 146.5 lb

## 2017-12-23 DIAGNOSIS — I10 Essential (primary) hypertension: Secondary | ICD-10-CM | POA: Diagnosis not present

## 2017-12-23 DIAGNOSIS — I5022 Chronic systolic (congestive) heart failure: Secondary | ICD-10-CM | POA: Diagnosis not present

## 2017-12-23 DIAGNOSIS — Z7902 Long term (current) use of antithrombotics/antiplatelets: Secondary | ICD-10-CM | POA: Diagnosis not present

## 2017-12-23 DIAGNOSIS — Z79899 Other long term (current) drug therapy: Secondary | ICD-10-CM | POA: Diagnosis not present

## 2017-12-23 DIAGNOSIS — I70213 Atherosclerosis of native arteries of extremities with intermittent claudication, bilateral legs: Secondary | ICD-10-CM | POA: Diagnosis not present

## 2017-12-23 DIAGNOSIS — I213 ST elevation (STEMI) myocardial infarction of unspecified site: Secondary | ICD-10-CM

## 2017-12-23 DIAGNOSIS — I130 Hypertensive heart and chronic kidney disease with heart failure and stage 1 through stage 4 chronic kidney disease, or unspecified chronic kidney disease: Secondary | ICD-10-CM | POA: Diagnosis not present

## 2017-12-23 DIAGNOSIS — I2510 Atherosclerotic heart disease of native coronary artery without angina pectoris: Secondary | ICD-10-CM | POA: Diagnosis not present

## 2017-12-23 DIAGNOSIS — I480 Paroxysmal atrial fibrillation: Secondary | ICD-10-CM | POA: Diagnosis not present

## 2017-12-23 DIAGNOSIS — I081 Rheumatic disorders of both mitral and tricuspid valves: Secondary | ICD-10-CM | POA: Diagnosis not present

## 2017-12-23 DIAGNOSIS — I272 Pulmonary hypertension, unspecified: Secondary | ICD-10-CM | POA: Insufficient documentation

## 2017-12-23 DIAGNOSIS — K219 Gastro-esophageal reflux disease without esophagitis: Secondary | ICD-10-CM | POA: Diagnosis not present

## 2017-12-23 DIAGNOSIS — Z7901 Long term (current) use of anticoagulants: Secondary | ICD-10-CM | POA: Diagnosis not present

## 2017-12-23 DIAGNOSIS — Z955 Presence of coronary angioplasty implant and graft: Secondary | ICD-10-CM

## 2017-12-23 NOTE — Patient Instructions (Signed)
Your physician has requested that you have an ankle brachial index (ABI). During this test an ultrasound and blood pressure cuff are used to evaluate the arteries that supply the arms and legs with blood. Allow thirty minutes for this exam. There are no restrictions or special instructions.  We will contact you in 6 months to schedule your next appointment.

## 2017-12-23 NOTE — Addendum Note (Signed)
Encounter addended by: Scarlette Calico, RN on: 12/23/2017 11:44 AM  Actions taken: Order list changed, Diagnosis association updated, Sign clinical note

## 2017-12-23 NOTE — Progress Notes (Signed)
Advanced Heart Failure Clinic Note  Primary HF Cardiologist: Dr Sharlotte Alamo  HPI:  [name] is a 83 y.o. female with h/o HTN, osteoporosis, L breast cancer s/p XRT and chemo and definitive surgery, HLD, and pre diabetes.   Admitted to [facility]  ED 07/27/17 with CP. Hospital course was complicated by acute respiratory failure, bradycardic arrest, Afib RVR, and cardiogenic shock in the setting of anterior MI.  Found to have acute anterior MI and taken urgently to the cath lab. [place] 07/27/17 as below with 3v CAD, LVEF 35-40%, elevated LVEDP. Underwent PCI with successful stenting of the ramus intermediate artery with DES x 2. Had bradycardic arrest on arrival to ICU with epi/atropine and brief CPR and intubation. As she improved she was extubated on 2/24. An ECHO was completed an showed LVEF 40-45%.     Diuresed with IV lasix and once euvolemic transitioned to po lasix.  HF medications optimized. Renal function was followed closely. She had several episodes of bradycardia so beta blocker was stopped. Later developed A fib RVR so amiodarone was loaded and she was started on eliquis. Chemically converted to NSR but had some break through A fib. Plan to continue amio 400 mg twice a day for 7 days then cut back to 200 mg twice a day. Continue eliquis 2.5 mg twice a day ( reduced dose for age and creatinine). Discharge 167 pounds.   She presents today for regular follow up. Feeling much better. Going to CR 3x/week. Walking on track and doing Nu-Step and bike. Feels good. No CP or SOB. Has bilateral claudication when walking. No edema, orthopnea or PND. Trying to gain weight back.    ECHO 07/27/2017 EF 40-45%   LHC 07/27/2017   Post Atrio lesion is 90% stenosed.  Prox LAD to Dist LAD lesion is 50% stenosed.  Ost 1st Diag lesion is 95% stenosed.  Ost 2nd Diag to 2nd Diag lesion is 90% stenosed.  Mid Cx lesion is 30% stenosed.  Ost Ramus to Ramus lesion is 100% stenosed.  A drug-eluting stent was  successfully placed using a STENT SYNERGY DES 2.75X28.  A drug-eluting stent was successfully placed using a STENT SYNERGY DES 2.5X12.  Post intervention, there is a 0% residual stenosis.  There is moderate to severe left ventricular systolic dysfunction.  LV end diastolic pressure is moderately elevated.  The left ventricular ejection fraction is 35-45% by visual estimate. 1. 3 vessel obstructive CAD - 100% ramus intermediate artery. This is a large vessel and is the culprit lesion. - 95% small first diagonal - 90% small second diagonal  - diffusely diseased LAD - 90% PLOM 2. Moderate to severe LV dysfunction. EF estimated at 35-40%. 3. Elevated LVEDP 4. Successful stenting of the ramus intermediate artery with DES x 2.  Plan; DAPT for one year  Review of systems complete and found to be negative unless listed in HPI.    SH:  Social History   Socioeconomic History  . Marital status: Married    Spouse name: Not on file  . Number of children: Not on file  . Years of education: Not on file  . Highest education level: Not on file  Occupational History  . Occupation: retired  Scientific laboratory technician  . Financial resource strain: Not on file  . Food insecurity:    Worry: Not on file    Inability: Not on file  . Transportation needs:    Medical: Not on file    Non-medical: Not on file  Tobacco  Use  . Smoking status: Never Smoker  . Smokeless tobacco: Never Used  Substance and Sexual Activity  . Alcohol use: No  . Drug use: No  . Sexual activity: Not on file  Lifestyle  . Physical activity:    Days per week: Not on file    Minutes per session: Not on file  . Stress: Not on file  Relationships  . Social connections:    Talks on phone: Not on file    Gets together: Not on file    Attends religious service: Not on file    Active member of club or organization: Not on file    Attends meetings of clubs or organizations: Not on file    Relationship status: Not  on file  . Intimate partner violence:    Fear of current or ex partner: Not on file    Emotionally abused: Not on file    Physically abused: Not on file    Forced sexual activity: Not on file  Other Topics Concern  . Not on file  Social History Narrative  . Not on file    FH:  Family History  Problem Relation Age of Onset  . Pancreatic cancer Mother        pancreastic  . Stroke Father   . Hypertension Brother   . Hypertension Sister   . Hypertension Brother   . Heart disease Brother   . Hypertension Sister     Past Medical History:  Diagnosis Date  . Anxiety   . Breast cancer of lower-outer quadrant of left female breast ([place]) 10/11/2014   treated with lumpectomy and radiation  . Coronary artery disease 07/27/2017  . GERD (gastroesophageal reflux disease)   . Hypertension   . Myocardial infarction ([place]) 07/27/2017   STEMI of anterolateral wall. Cath w/ stent and cabg by Dr. Martinique  . Osteoporosis 10/20/2014  . Personal history of radiation therapy 2016   treatment of breast cancer  . PONV (postoperative nausea and vomiting)   . PUD (peptic ulcer disease)   . Pure hypercholesterolemia 04/08/2014  . Stone, kidney     Current Outpatient Medications  Medication Sig Dispense Refill  . anastrozole (ARIMIDEX) 1 MG tablet TAKE ONE TABLET BY MOUTH ONCE DAILY 90 tablet 4  . apixaban (ELIQUIS) 2.5 MG TABS tablet Take 1 tablet (2.5 mg total) by mouth 2 (two) times daily. 60 tablet 6  . atorvastatin (LIPITOR) 80 MG tablet Take 1 tablet (80 mg total) by mouth daily at 6 PM. 30 tablet 6  . Calcium Carb-Cholecalciferol (CALCIUM + D3) 600-800 MG-UNIT TABS Take 1 tablet by mouth daily.     . Cholecalciferol (VITAMIN D-3) 5000 units TABS Take by mouth.    . citalopram (CELEXA) 10 MG tablet Take 1 tablet (10 mg total) by mouth daily. 30 tablet 6  . clopidogrel (PLAVIX) 75 MG tablet Take 1 tablet (75 mg total) by mouth daily. 30 tablet 6  . clotrimazole (MYCELEX) 10 MG troche Take 1  tablet (10 mg total) by mouth 5 (five) times daily. Slowly dissolve in mouth, do not chew or swallow whole. Use ALL lozenges 70 tablet 0  . co-enzyme Q-10 30 MG capsule Take 30 mg by mouth 3 (three) times daily.    . fish oil-omega-3 fatty acids 1000 MG capsule Take 1 g by mouth daily.    Marland Kitchen losartan (COZAAR) 25 MG tablet Take 1 tablet (25 mg total) by mouth 2 (two) times daily. 60 tablet 6  . magnesium gluconate (  MAGONATE) 500 MG tablet Take 500 mg by mouth 2 (two) times daily.    . Multiple Vitamin (MULTIVITAMIN) tablet Take 1 tablet by mouth daily.    . nitroGLYCERIN (NITROSTAT) 0.3 MG SL tablet Place 1 tablet (0.3 mg total) under the tongue every 5 (five) minutes as needed for chest pain. 90 tablet 12  . spironolactone (ALDACTONE) 25 MG tablet Take 1 tablet (25 mg total) by mouth daily. 30 tablet 6   No current facility-administered medications for this encounter.    Vitals:   12/23/17 1113  BP: (!) 146/52  Pulse: 65  SpO2: 100%  Weight: 146 lb 8 oz (66.5 kg)     Wt Readings from Last 3 Encounters:  12/23/17 146 lb 8 oz (66.5 kg)  12/03/17 147 lb (66.7 kg)  10/31/17 149 lb 3.2 oz (67.7 kg)    PHYSICAL EXAM:  General:  Well appearing. No resp difficulty HEENT: normal Neck: supple. no JVD. Carotids 2+ bilat; no bruits. No lymphadenopathy or thryomegaly appreciated. Cor: PMI nondisplaced. Regular rate & rhythm. No rubs, gallops or murmurs. Lungs: clear Abdomen: soft, nontender, nondistended. No hepatosplenomegaly. No bruits or masses. Good bowel sounds. Extremities: no cyanosis, clubbing, rash, edema Neuro: alert & orientedx3, cranial nerves grossly intact. moves all 4 extremities w/o difficulty. Affect pleasant   ASSESSMENT & PLAN: 1. CAD - No s/s of ischemia.    - 3V CAD with STEMI 07/2017 S/P DES x2  - Continue plavix, eliquis, and high dose statin. Off ASA - Continue cardiac rehab.  - Continue carvedilol 3.125 bid  2. Chronic Systolic Heart Failure, ICM- ECHO EF 07/2017  EF 40-45% - Improved NYHA II symptoms Echo today EF 55-60%  (Personally reviewed)  - Volume status stable. Now off lasix. Can take as needed - Continue losartan 25 mg twice a day.  - Continue 25 mg spironolactone daily  - Continue coreg 3.125 bid - SBP running 100-115 at CR so will not increase especially with CKD IV  - Reinforced fluid restriction to < 2 L daily, sodium restriction to less than 2000 mg daily, and the importance of daily weights.    3. PAF - Amio stopped last visit due to tremors and nausea. Watch for recurrent AF.  - In NSR today  - Continue eliquis 2.5 mg BID.   4. HTN - Meds as above.   5. H.O Left Breast Cancer - No change to current plan.    6. CKD III - Creatinine 1.5-1.8. BMET today.   7. Intermittent claudication - Check ABIs   Will see back in 6 months. If HF stable can f/u with [facility] Cardiology.   Glori Bickers, MD  11:29 AM

## 2017-12-23 NOTE — Progress Notes (Signed)
  Echocardiogram 2D Echocardiogram has been performed.  Darlina Sicilian M 12/23/2017, 11:05 AM

## 2017-12-25 ENCOUNTER — Encounter (HOSPITAL_COMMUNITY): Payer: Medicare Other

## 2017-12-25 ENCOUNTER — Encounter (HOSPITAL_COMMUNITY)
Admission: RE | Admit: 2017-12-25 | Discharge: 2017-12-25 | Disposition: A | Discharge status: Home / Self Care | Payer: Medicare Other | Source: Ambulatory Visit | Attending: Internal Medicine | Admitting: Internal Medicine

## 2017-12-25 DIAGNOSIS — Z79899 Other long term (current) drug therapy: Secondary | ICD-10-CM | POA: Diagnosis not present

## 2017-12-25 DIAGNOSIS — Z955 Presence of coronary angioplasty implant and graft: Secondary | ICD-10-CM

## 2017-12-25 DIAGNOSIS — I10 Essential (primary) hypertension: Secondary | ICD-10-CM | POA: Diagnosis not present

## 2017-12-25 DIAGNOSIS — I213 ST elevation (STEMI) myocardial infarction of unspecified site: Secondary | ICD-10-CM

## 2017-12-25 DIAGNOSIS — Z7901 Long term (current) use of anticoagulants: Secondary | ICD-10-CM | POA: Diagnosis not present

## 2017-12-25 DIAGNOSIS — K219 Gastro-esophageal reflux disease without esophagitis: Secondary | ICD-10-CM | POA: Diagnosis not present

## 2017-12-25 DIAGNOSIS — Z7902 Long term (current) use of antithrombotics/antiplatelets: Secondary | ICD-10-CM | POA: Diagnosis not present

## 2017-12-27 ENCOUNTER — Encounter (HOSPITAL_COMMUNITY): Payer: Medicare Other

## 2017-12-27 ENCOUNTER — Encounter (HOSPITAL_COMMUNITY)
Admission: RE | Admit: 2017-12-27 | Discharge: 2017-12-27 | Disposition: A | Discharge status: Home / Self Care | Payer: Medicare Other | Source: Ambulatory Visit | Attending: Internal Medicine | Admitting: Internal Medicine

## 2017-12-27 DIAGNOSIS — Z955 Presence of coronary angioplasty implant and graft: Secondary | ICD-10-CM | POA: Diagnosis not present

## 2017-12-27 DIAGNOSIS — I213 ST elevation (STEMI) myocardial infarction of unspecified site: Secondary | ICD-10-CM

## 2017-12-27 DIAGNOSIS — K219 Gastro-esophageal reflux disease without esophagitis: Secondary | ICD-10-CM | POA: Diagnosis not present

## 2017-12-27 DIAGNOSIS — Z7902 Long term (current) use of antithrombotics/antiplatelets: Secondary | ICD-10-CM | POA: Diagnosis not present

## 2017-12-27 DIAGNOSIS — Z7901 Long term (current) use of anticoagulants: Secondary | ICD-10-CM | POA: Diagnosis not present

## 2017-12-27 DIAGNOSIS — I10 Essential (primary) hypertension: Secondary | ICD-10-CM | POA: Diagnosis not present

## 2017-12-27 DIAGNOSIS — Z79899 Other long term (current) drug therapy: Secondary | ICD-10-CM | POA: Diagnosis not present

## 2017-12-30 ENCOUNTER — Encounter (HOSPITAL_COMMUNITY): Payer: Medicare Other

## 2017-12-30 ENCOUNTER — Encounter (HOSPITAL_COMMUNITY)
Admission: RE | Admit: 2017-12-30 | Discharge: 2017-12-30 | Disposition: A | Discharge status: Home / Self Care | Payer: Medicare Other | Source: Ambulatory Visit | Attending: Internal Medicine | Admitting: Internal Medicine

## 2017-12-30 DIAGNOSIS — K219 Gastro-esophageal reflux disease without esophagitis: Secondary | ICD-10-CM | POA: Diagnosis not present

## 2017-12-30 DIAGNOSIS — Z7902 Long term (current) use of antithrombotics/antiplatelets: Secondary | ICD-10-CM | POA: Diagnosis not present

## 2017-12-30 DIAGNOSIS — I213 ST elevation (STEMI) myocardial infarction of unspecified site: Secondary | ICD-10-CM

## 2017-12-30 DIAGNOSIS — Z79899 Other long term (current) drug therapy: Secondary | ICD-10-CM | POA: Diagnosis not present

## 2017-12-30 DIAGNOSIS — Z7901 Long term (current) use of anticoagulants: Secondary | ICD-10-CM | POA: Diagnosis not present

## 2017-12-30 DIAGNOSIS — Z955 Presence of coronary angioplasty implant and graft: Secondary | ICD-10-CM

## 2017-12-30 DIAGNOSIS — I10 Essential (primary) hypertension: Secondary | ICD-10-CM | POA: Diagnosis not present

## 2018-01-01 ENCOUNTER — Encounter (HOSPITAL_COMMUNITY): Payer: Medicare Other

## 2018-01-01 ENCOUNTER — Other Ambulatory Visit (HOSPITAL_COMMUNITY): Payer: Self-pay | Admitting: *Deleted

## 2018-01-01 ENCOUNTER — Encounter (HOSPITAL_COMMUNITY)
Admission: RE | Admit: 2018-01-01 | Discharge: 2018-01-01 | Disposition: A | Discharge status: Home / Self Care | Payer: Medicare Other | Source: Ambulatory Visit | Attending: Internal Medicine | Admitting: Internal Medicine

## 2018-01-01 DIAGNOSIS — Z79899 Other long term (current) drug therapy: Secondary | ICD-10-CM | POA: Diagnosis not present

## 2018-01-01 DIAGNOSIS — Z955 Presence of coronary angioplasty implant and graft: Secondary | ICD-10-CM

## 2018-01-01 DIAGNOSIS — I213 ST elevation (STEMI) myocardial infarction of unspecified site: Secondary | ICD-10-CM

## 2018-01-01 DIAGNOSIS — K219 Gastro-esophageal reflux disease without esophagitis: Secondary | ICD-10-CM | POA: Diagnosis not present

## 2018-01-01 DIAGNOSIS — I10 Essential (primary) hypertension: Secondary | ICD-10-CM | POA: Diagnosis not present

## 2018-01-01 DIAGNOSIS — Z7902 Long term (current) use of antithrombotics/antiplatelets: Secondary | ICD-10-CM | POA: Diagnosis not present

## 2018-01-01 DIAGNOSIS — Z7901 Long term (current) use of anticoagulants: Secondary | ICD-10-CM | POA: Diagnosis not present

## 2018-01-01 MED ORDER — CLOPIDOGREL BISULFATE 75 MG PO TABS: 75.0000 mg | ORAL_TABLET | Freq: Every day | ORAL | 6 refills | Status: DC

## 2018-01-03 ENCOUNTER — Encounter (HOSPITAL_COMMUNITY): Payer: Medicare Other

## 2018-01-03 ENCOUNTER — Encounter (HOSPITAL_COMMUNITY)
Admission: RE | Admit: 2018-01-03 | Discharge: 2018-01-03 | Disposition: A | Discharge status: Home / Self Care | Payer: Medicare Other | Source: Ambulatory Visit | Attending: Internal Medicine | Admitting: Internal Medicine

## 2018-01-03 DIAGNOSIS — I213 ST elevation (STEMI) myocardial infarction of unspecified site: Secondary | ICD-10-CM | POA: Insufficient documentation

## 2018-01-03 DIAGNOSIS — Z955 Presence of coronary angioplasty implant and graft: Secondary | ICD-10-CM | POA: Insufficient documentation

## 2018-01-03 DIAGNOSIS — Z7902 Long term (current) use of antithrombotics/antiplatelets: Secondary | ICD-10-CM | POA: Insufficient documentation

## 2018-01-03 DIAGNOSIS — Z79899 Other long term (current) drug therapy: Secondary | ICD-10-CM | POA: Insufficient documentation

## 2018-01-03 DIAGNOSIS — Z7901 Long term (current) use of anticoagulants: Secondary | ICD-10-CM | POA: Insufficient documentation

## 2018-01-03 DIAGNOSIS — I10 Essential (primary) hypertension: Secondary | ICD-10-CM | POA: Insufficient documentation

## 2018-01-03 DIAGNOSIS — K219 Gastro-esophageal reflux disease without esophagitis: Secondary | ICD-10-CM | POA: Insufficient documentation

## 2018-01-06 ENCOUNTER — Encounter (HOSPITAL_COMMUNITY)
Admission: RE | Admit: 2018-01-06 | Discharge: 2018-01-06 | Disposition: A | Discharge status: Home / Self Care | Payer: Medicare Other | Source: Ambulatory Visit | Attending: Internal Medicine | Admitting: Internal Medicine

## 2018-01-06 ENCOUNTER — Encounter (HOSPITAL_COMMUNITY): Payer: Medicare Other

## 2018-01-06 ENCOUNTER — Other Ambulatory Visit (HOSPITAL_COMMUNITY): Payer: Self-pay | Admitting: *Deleted

## 2018-01-06 DIAGNOSIS — Z7901 Long term (current) use of anticoagulants: Secondary | ICD-10-CM | POA: Diagnosis not present

## 2018-01-06 DIAGNOSIS — K219 Gastro-esophageal reflux disease without esophagitis: Secondary | ICD-10-CM | POA: Diagnosis not present

## 2018-01-06 DIAGNOSIS — Z7902 Long term (current) use of antithrombotics/antiplatelets: Secondary | ICD-10-CM | POA: Diagnosis not present

## 2018-01-06 DIAGNOSIS — I213 ST elevation (STEMI) myocardial infarction of unspecified site: Secondary | ICD-10-CM | POA: Diagnosis not present

## 2018-01-06 DIAGNOSIS — Z955 Presence of coronary angioplasty implant and graft: Secondary | ICD-10-CM

## 2018-01-06 DIAGNOSIS — Z79899 Other long term (current) drug therapy: Secondary | ICD-10-CM | POA: Diagnosis not present

## 2018-01-06 DIAGNOSIS — I10 Essential (primary) hypertension: Secondary | ICD-10-CM | POA: Diagnosis not present

## 2018-01-06 MED ORDER — CLOPIDOGREL BISULFATE 75 MG PO TABS: 75.0000 mg | ORAL_TABLET | Freq: Every day | ORAL | 6 refills | Status: DC

## 2018-01-07 NOTE — Progress Notes (Signed)
[place]  Telephone:(336) [phone] [phone]     ID: [name] DOB: 11/22/1934  MR#: [id number]  URK#:[id number]  Patient Care Team: Shawnee Knapp, MD as PCP - General (Family Medicine) Clarene Essex, MD as Consulting Physician (Gastroenterology) Jovita Kussmaul, MD as Consulting Physician (General Surgery) , Virgie Dad, MD as Consulting Physician (Oncology) Eppie Gibson, MD as Attending Physician (Radiation Oncology) Mauro Kaufmann, RN as Registered Nurse Rockwell Germany, RN as Registered Nurse PCP: Shawnee Knapp, MD OTHER MD:  CHIEF COMPLAINT: Estrogen receptor positive breast cancer  CURRENT TREATMENT: Anastrozole, denosumab/Prolia   BREAST CANCER HISTORY: From the original intake note:  [name] noted a palpable mass in the left breast and brought it to her primary care physician's attention. On 09/27/2014 the patient underwent bilateral diagnostic mammography with tomosynthesis and left breast ultrasonography. The breast density was category B. In the outer left breast area there was an irregularly shaped mass not associated with suspicious microcalcifications or distortion. The previously described large of fibroadenolipoma in the right breast was unchanged. On physical exam there was a firm palpable mass at the 3:30 position in the left breast 2 cm prone the nipple which, on targeted ultrasound, was irregular, hypoechoic, and measured 2.0 cm. Ultrasound of the left axilla was negative.  Biopsy of this mass 10/05/2014 showed ([id number]) an invasive ductal carcinoma him a grade 1, estrogen receptor 90% positive, progesterone receptor 90% positive, both with strong staining intensity, with an MIB-1 of 5%, and no HER-2 amplification, the signals ratio being 1.06 and the number per cell 1.70.  The patient's subsequent history is as detailed below  INTERVAL HISTORY: [name] returns today for follow-up and treatment of her estrogen receptor positive  breast cancer. She continues on anastrozole, with good tolerance. She denies issues with hot flashes. Vaginal dryness is not a major issue.   She receives denosumab/Prolia every 6 months with a dose due today. She tolerates this well without any complications.   The interval history is significant for an anterior MI with cardiogenic shock in February of this year.  She had a complicated course but has recovered and is completing her cardiac rehab within the week.  She is now on Eliquis and Plavix.  She did have an episode of hematuria last week but that has not recurred.  She follows up with her cardiologist Dr. Haroldine Laws.  REVIEW OF SYSTEMS: [name] currently denies shortness of breath or chest pain or pressure.  She will follow up with her PCP next week. She denies unusual headaches, visual changes, nausea, vomiting, or dizziness. There has been no unusual cough, phlegm production, or pleurisy. This been no change in bowel or bladder habits. She denies unexplained fatigue or unexplained weight loss, rash, or fever. A detailed review of systems was otherwise stable.    PAST MEDICAL HISTORY: Past Medical History:  Diagnosis Date  . Anxiety   . Breast cancer of lower-outer quadrant of left female breast ([place]) 10/11/2014   treated with lumpectomy and radiation  . Coronary artery disease 07/27/2017  . GERD (gastroesophageal reflux disease)   . Hypertension   . Myocardial infarction ([place]) 07/27/2017   STEMI of anterolateral wall. Cath w/ stent and cabg by Dr. Martinique  . Osteoporosis 10/20/2014  . Personal history of radiation therapy 2016   treatment of breast cancer  . PONV (postoperative nausea and vomiting)   . PUD (peptic ulcer disease)   . Pure hypercholesterolemia 04/08/2014  . Stone, kidney  [place]  Telephone:(336) [phone] [phone]     ID: [name] DOB: 11/22/1934  MR#: [id number]  URK#:[id number]  Patient Care Team: Shawnee Knapp, MD as PCP - General (Family Medicine) Clarene Essex, MD as Consulting Physician (Gastroenterology) Jovita Kussmaul, MD as Consulting Physician (General Surgery) , Virgie Dad, MD as Consulting Physician (Oncology) Eppie Gibson, MD as Attending Physician (Radiation Oncology) Mauro Kaufmann, RN as Registered Nurse Rockwell Germany, RN as Registered Nurse PCP: Shawnee Knapp, MD OTHER MD:  CHIEF COMPLAINT: Estrogen receptor positive breast cancer  CURRENT TREATMENT: Anastrozole, denosumab/Prolia   BREAST CANCER HISTORY: From the original intake note:  [name] noted a palpable mass in the left breast and brought it to her primary care physician's attention. On 09/27/2014 the patient underwent bilateral diagnostic mammography with tomosynthesis and left breast ultrasonography. The breast density was category B. In the outer left breast area there was an irregularly shaped mass not associated with suspicious microcalcifications or distortion. The previously described large of fibroadenolipoma in the right breast was unchanged. On physical exam there was a firm palpable mass at the 3:30 position in the left breast 2 cm prone the nipple which, on targeted ultrasound, was irregular, hypoechoic, and measured 2.0 cm. Ultrasound of the left axilla was negative.  Biopsy of this mass 10/05/2014 showed ([id number]) an invasive ductal carcinoma him a grade 1, estrogen receptor 90% positive, progesterone receptor 90% positive, both with strong staining intensity, with an MIB-1 of 5%, and no HER-2 amplification, the signals ratio being 1.06 and the number per cell 1.70.  The patient's subsequent history is as detailed below  INTERVAL HISTORY: [name] returns today for follow-up and treatment of her estrogen receptor positive  breast cancer. She continues on anastrozole, with good tolerance. She denies issues with hot flashes. Vaginal dryness is not a major issue.   She receives denosumab/Prolia every 6 months with a dose due today. She tolerates this well without any complications.   The interval history is significant for an anterior MI with cardiogenic shock in February of this year.  She had a complicated course but has recovered and is completing her cardiac rehab within the week.  She is now on Eliquis and Plavix.  She did have an episode of hematuria last week but that has not recurred.  She follows up with her cardiologist Dr. Haroldine Laws.  REVIEW OF SYSTEMS: [name] currently denies shortness of breath or chest pain or pressure.  She will follow up with her PCP next week. She denies unusual headaches, visual changes, nausea, vomiting, or dizziness. There has been no unusual cough, phlegm production, or pleurisy. This been no change in bowel or bladder habits. She denies unexplained fatigue or unexplained weight loss, rash, or fever. A detailed review of systems was otherwise stable.    PAST MEDICAL HISTORY: Past Medical History:  Diagnosis Date  . Anxiety   . Breast cancer of lower-outer quadrant of left female breast ([place]) 10/11/2014   treated with lumpectomy and radiation  . Coronary artery disease 07/27/2017  . GERD (gastroesophageal reflux disease)   . Hypertension   . Myocardial infarction ([place]) 07/27/2017   STEMI of anterolateral wall. Cath w/ stent and cabg by Dr. Martinique  . Osteoporosis 10/20/2014  . Personal history of radiation therapy 2016   treatment of breast cancer  . PONV (postoperative nausea and vomiting)   . PUD (peptic ulcer disease)   . Pure hypercholesterolemia 04/08/2014  . Stone, kidney  [place]  Telephone:(336) [phone] [phone]     ID: [name] DOB: 11/22/1934  MR#: [id number]  URK#:[id number]  Patient Care Team: Shawnee Knapp, MD as PCP - General (Family Medicine) Clarene Essex, MD as Consulting Physician (Gastroenterology) Jovita Kussmaul, MD as Consulting Physician (General Surgery) , Virgie Dad, MD as Consulting Physician (Oncology) Eppie Gibson, MD as Attending Physician (Radiation Oncology) Mauro Kaufmann, RN as Registered Nurse Rockwell Germany, RN as Registered Nurse PCP: Shawnee Knapp, MD OTHER MD:  CHIEF COMPLAINT: Estrogen receptor positive breast cancer  CURRENT TREATMENT: Anastrozole, denosumab/Prolia   BREAST CANCER HISTORY: From the original intake note:  [name] noted a palpable mass in the left breast and brought it to her primary care physician's attention. On 09/27/2014 the patient underwent bilateral diagnostic mammography with tomosynthesis and left breast ultrasonography. The breast density was category B. In the outer left breast area there was an irregularly shaped mass not associated with suspicious microcalcifications or distortion. The previously described large of fibroadenolipoma in the right breast was unchanged. On physical exam there was a firm palpable mass at the 3:30 position in the left breast 2 cm prone the nipple which, on targeted ultrasound, was irregular, hypoechoic, and measured 2.0 cm. Ultrasound of the left axilla was negative.  Biopsy of this mass 10/05/2014 showed ([id number]) an invasive ductal carcinoma him a grade 1, estrogen receptor 90% positive, progesterone receptor 90% positive, both with strong staining intensity, with an MIB-1 of 5%, and no HER-2 amplification, the signals ratio being 1.06 and the number per cell 1.70.  The patient's subsequent history is as detailed below  INTERVAL HISTORY: [name] returns today for follow-up and treatment of her estrogen receptor positive  breast cancer. She continues on anastrozole, with good tolerance. She denies issues with hot flashes. Vaginal dryness is not a major issue.   She receives denosumab/Prolia every 6 months with a dose due today. She tolerates this well without any complications.   The interval history is significant for an anterior MI with cardiogenic shock in February of this year.  She had a complicated course but has recovered and is completing her cardiac rehab within the week.  She is now on Eliquis and Plavix.  She did have an episode of hematuria last week but that has not recurred.  She follows up with her cardiologist Dr. Haroldine Laws.  REVIEW OF SYSTEMS: [name] currently denies shortness of breath or chest pain or pressure.  She will follow up with her PCP next week. She denies unusual headaches, visual changes, nausea, vomiting, or dizziness. There has been no unusual cough, phlegm production, or pleurisy. This been no change in bowel or bladder habits. She denies unexplained fatigue or unexplained weight loss, rash, or fever. A detailed review of systems was otherwise stable.    PAST MEDICAL HISTORY: Past Medical History:  Diagnosis Date  . Anxiety   . Breast cancer of lower-outer quadrant of left female breast ([place]) 10/11/2014   treated with lumpectomy and radiation  . Coronary artery disease 07/27/2017  . GERD (gastroesophageal reflux disease)   . Hypertension   . Myocardial infarction ([place]) 07/27/2017   STEMI of anterolateral wall. Cath w/ stent and cabg by Dr. Martinique  . Osteoporosis 10/20/2014  . Personal history of radiation therapy 2016   treatment of breast cancer  . PONV (postoperative nausea and vomiting)   . PUD (peptic ulcer disease)   . Pure hypercholesterolemia 04/08/2014  . Stone, kidney  [place]  Telephone:(336) [phone] [phone]     ID: [name] DOB: 11/22/1934  MR#: [id number]  URK#:[id number]  Patient Care Team: Shawnee Knapp, MD as PCP - General (Family Medicine) Clarene Essex, MD as Consulting Physician (Gastroenterology) Jovita Kussmaul, MD as Consulting Physician (General Surgery) , Virgie Dad, MD as Consulting Physician (Oncology) Eppie Gibson, MD as Attending Physician (Radiation Oncology) Mauro Kaufmann, RN as Registered Nurse Rockwell Germany, RN as Registered Nurse PCP: Shawnee Knapp, MD OTHER MD:  CHIEF COMPLAINT: Estrogen receptor positive breast cancer  CURRENT TREATMENT: Anastrozole, denosumab/Prolia   BREAST CANCER HISTORY: From the original intake note:  [name] noted a palpable mass in the left breast and brought it to her primary care physician's attention. On 09/27/2014 the patient underwent bilateral diagnostic mammography with tomosynthesis and left breast ultrasonography. The breast density was category B. In the outer left breast area there was an irregularly shaped mass not associated with suspicious microcalcifications or distortion. The previously described large of fibroadenolipoma in the right breast was unchanged. On physical exam there was a firm palpable mass at the 3:30 position in the left breast 2 cm prone the nipple which, on targeted ultrasound, was irregular, hypoechoic, and measured 2.0 cm. Ultrasound of the left axilla was negative.  Biopsy of this mass 10/05/2014 showed ([id number]) an invasive ductal carcinoma him a grade 1, estrogen receptor 90% positive, progesterone receptor 90% positive, both with strong staining intensity, with an MIB-1 of 5%, and no HER-2 amplification, the signals ratio being 1.06 and the number per cell 1.70.  The patient's subsequent history is as detailed below  INTERVAL HISTORY: [name] returns today for follow-up and treatment of her estrogen receptor positive  breast cancer. She continues on anastrozole, with good tolerance. She denies issues with hot flashes. Vaginal dryness is not a major issue.   She receives denosumab/Prolia every 6 months with a dose due today. She tolerates this well without any complications.   The interval history is significant for an anterior MI with cardiogenic shock in February of this year.  She had a complicated course but has recovered and is completing her cardiac rehab within the week.  She is now on Eliquis and Plavix.  She did have an episode of hematuria last week but that has not recurred.  She follows up with her cardiologist Dr. Haroldine Laws.  REVIEW OF SYSTEMS: [name] currently denies shortness of breath or chest pain or pressure.  She will follow up with her PCP next week. She denies unusual headaches, visual changes, nausea, vomiting, or dizziness. There has been no unusual cough, phlegm production, or pleurisy. This been no change in bowel or bladder habits. She denies unexplained fatigue or unexplained weight loss, rash, or fever. A detailed review of systems was otherwise stable.    PAST MEDICAL HISTORY: Past Medical History:  Diagnosis Date  . Anxiety   . Breast cancer of lower-outer quadrant of left female breast ([place]) 10/11/2014   treated with lumpectomy and radiation  . Coronary artery disease 07/27/2017  . GERD (gastroesophageal reflux disease)   . Hypertension   . Myocardial infarction ([place]) 07/27/2017   STEMI of anterolateral wall. Cath w/ stent and cabg by Dr. Martinique  . Osteoporosis 10/20/2014  . Personal history of radiation therapy 2016   treatment of breast cancer  . PONV (postoperative nausea and vomiting)   . PUD (peptic ulcer disease)   . Pure hypercholesterolemia 04/08/2014  . Stone, kidney  [place]  Telephone:(336) [phone] [phone]     ID: [name] DOB: 11/22/1934  MR#: [id number]  URK#:[id number]  Patient Care Team: Shawnee Knapp, MD as PCP - General (Family Medicine) Clarene Essex, MD as Consulting Physician (Gastroenterology) Jovita Kussmaul, MD as Consulting Physician (General Surgery) , Virgie Dad, MD as Consulting Physician (Oncology) Eppie Gibson, MD as Attending Physician (Radiation Oncology) Mauro Kaufmann, RN as Registered Nurse Rockwell Germany, RN as Registered Nurse PCP: Shawnee Knapp, MD OTHER MD:  CHIEF COMPLAINT: Estrogen receptor positive breast cancer  CURRENT TREATMENT: Anastrozole, denosumab/Prolia   BREAST CANCER HISTORY: From the original intake note:  [name] noted a palpable mass in the left breast and brought it to her primary care physician's attention. On 09/27/2014 the patient underwent bilateral diagnostic mammography with tomosynthesis and left breast ultrasonography. The breast density was category B. In the outer left breast area there was an irregularly shaped mass not associated with suspicious microcalcifications or distortion. The previously described large of fibroadenolipoma in the right breast was unchanged. On physical exam there was a firm palpable mass at the 3:30 position in the left breast 2 cm prone the nipple which, on targeted ultrasound, was irregular, hypoechoic, and measured 2.0 cm. Ultrasound of the left axilla was negative.  Biopsy of this mass 10/05/2014 showed ([id number]) an invasive ductal carcinoma him a grade 1, estrogen receptor 90% positive, progesterone receptor 90% positive, both with strong staining intensity, with an MIB-1 of 5%, and no HER-2 amplification, the signals ratio being 1.06 and the number per cell 1.70.  The patient's subsequent history is as detailed below  INTERVAL HISTORY: [name] returns today for follow-up and treatment of her estrogen receptor positive  breast cancer. She continues on anastrozole, with good tolerance. She denies issues with hot flashes. Vaginal dryness is not a major issue.   She receives denosumab/Prolia every 6 months with a dose due today. She tolerates this well without any complications.   The interval history is significant for an anterior MI with cardiogenic shock in February of this year.  She had a complicated course but has recovered and is completing her cardiac rehab within the week.  She is now on Eliquis and Plavix.  She did have an episode of hematuria last week but that has not recurred.  She follows up with her cardiologist Dr. Haroldine Laws.  REVIEW OF SYSTEMS: [name] currently denies shortness of breath or chest pain or pressure.  She will follow up with her PCP next week. She denies unusual headaches, visual changes, nausea, vomiting, or dizziness. There has been no unusual cough, phlegm production, or pleurisy. This been no change in bowel or bladder habits. She denies unexplained fatigue or unexplained weight loss, rash, or fever. A detailed review of systems was otherwise stable.    PAST MEDICAL HISTORY: Past Medical History:  Diagnosis Date  . Anxiety   . Breast cancer of lower-outer quadrant of left female breast ([place]) 10/11/2014   treated with lumpectomy and radiation  . Coronary artery disease 07/27/2017  . GERD (gastroesophageal reflux disease)   . Hypertension   . Myocardial infarction ([place]) 07/27/2017   STEMI of anterolateral wall. Cath w/ stent and cabg by Dr. Martinique  . Osteoporosis 10/20/2014  . Personal history of radiation therapy 2016   treatment of breast cancer  . PONV (postoperative nausea and vomiting)   . PUD (peptic ulcer disease)   . Pure hypercholesterolemia 04/08/2014  . Stone, kidney

## 2018-01-08 ENCOUNTER — Inpatient Hospital Stay: Payer: Medicare Other

## 2018-01-08 ENCOUNTER — Telehealth: Payer: Self-pay | Admitting: Oncology

## 2018-01-08 ENCOUNTER — Other Ambulatory Visit (HOSPITAL_COMMUNITY): Payer: Self-pay | Admitting: Internal Medicine

## 2018-01-08 ENCOUNTER — Encounter: Payer: Medicare Other | Admitting: Family Medicine

## 2018-01-08 ENCOUNTER — Encounter (HOSPITAL_COMMUNITY): Payer: Medicare Other

## 2018-01-08 ENCOUNTER — Inpatient Hospital Stay (HOSPITAL_BASED_OUTPATIENT_CLINIC_OR_DEPARTMENT_OTHER): Payer: Medicare Other | Admitting: Oncology

## 2018-01-08 ENCOUNTER — Inpatient Hospital Stay: Payer: Medicare Other | Attending: Oncology

## 2018-01-08 VITALS — BP 130/61 | HR 71 | Temp 98.2°F | Resp 18 | Ht 68.0 in | Wt 146.8 lb

## 2018-01-08 DIAGNOSIS — C50512 Malignant neoplasm of lower-outer quadrant of left female breast: Secondary | ICD-10-CM | POA: Diagnosis not present

## 2018-01-08 DIAGNOSIS — D649 Anemia, unspecified: Secondary | ICD-10-CM | POA: Insufficient documentation

## 2018-01-08 DIAGNOSIS — I482 Chronic atrial fibrillation, unspecified: Secondary | ICD-10-CM

## 2018-01-08 DIAGNOSIS — M810 Age-related osteoporosis without current pathological fracture: Secondary | ICD-10-CM

## 2018-01-08 DIAGNOSIS — I252 Old myocardial infarction: Secondary | ICD-10-CM | POA: Insufficient documentation

## 2018-01-08 DIAGNOSIS — D631 Anemia in chronic kidney disease: Secondary | ICD-10-CM

## 2018-01-08 DIAGNOSIS — Z170 Estrogen receptor positive status [ER+]: Secondary | ICD-10-CM | POA: Diagnosis not present

## 2018-01-08 DIAGNOSIS — M818 Other osteoporosis without current pathological fracture: Secondary | ICD-10-CM

## 2018-01-08 DIAGNOSIS — N184 Chronic kidney disease, stage 4 (severe): Secondary | ICD-10-CM

## 2018-01-08 DIAGNOSIS — I70213 Atherosclerosis of native arteries of extremities with intermittent claudication, bilateral legs: Secondary | ICD-10-CM

## 2018-01-08 DIAGNOSIS — N189 Chronic kidney disease, unspecified: Secondary | ICD-10-CM

## 2018-01-08 LAB — CBC WITH DIFFERENTIAL/PLATELET
BASOS ABS: 0 10*3/uL (ref 0.0–0.1)
BASOS PCT: 1 %
EOS PCT: 1 %
Eosinophils Absolute: 0.1 10*3/uL (ref 0.0–0.5)
HEMATOCRIT: 27.6 % — AB (ref 34.8–46.6)
Hemoglobin: 8.9 g/dL — ABNORMAL LOW (ref 11.6–15.9)
Lymphocytes Relative: 18 %
Lymphs Abs: 0.8 10*3/uL — ABNORMAL LOW (ref 0.9–3.3)
MCH: 30.9 pg (ref 25.1–34.0)
MCHC: 32.2 g/dL (ref 31.5–36.0)
MCV: 95.8 fL (ref 79.5–101.0)
MONO ABS: 0.4 10*3/uL (ref 0.1–0.9)
MONOS PCT: 8 %
NEUTROS ABS: 3.1 10*3/uL (ref 1.5–6.5)
Neutrophils Relative %: 72 %
Platelets: 173 10*3/uL (ref 145–400)
RBC: 2.88 MIL/uL — ABNORMAL LOW (ref 3.70–5.45)
RDW: 13.2 % (ref 11.2–14.5)
WBC: 4.4 10*3/uL (ref 3.9–10.3)

## 2018-01-08 LAB — COMPREHENSIVE METABOLIC PANEL
ALBUMIN: 3.8 g/dL (ref 3.5–5.0)
ALK PHOS: 44 U/L (ref 38–126)
ALT: 29 U/L (ref 0–44)
AST: 23 U/L (ref 15–41)
Anion gap: 9 (ref 5–15)
BILIRUBIN TOTAL: 0.7 mg/dL (ref 0.3–1.2)
BUN: 33 mg/dL — AB (ref 8–23)
CALCIUM: 10.7 mg/dL — AB (ref 8.9–10.3)
CO2: 22 mmol/L (ref 22–32)
CREATININE: 1.85 mg/dL — AB (ref 0.44–1.00)
Chloride: 109 mmol/L (ref 98–111)
GFR calc Af Amer: 29 mL/min — ABNORMAL LOW (ref >60)
GFR calc non Af Amer: 25 mL/min — ABNORMAL LOW (ref >60)
GLUCOSE: 93 mg/dL (ref 70–99)
Potassium: 4.5 mmol/L (ref 3.5–5.1)
SODIUM: 140 mmol/L (ref 135–145)
TOTAL PROTEIN: 6.7 g/dL (ref 6.5–8.1)

## 2018-01-08 MED ORDER — DENOSUMAB 60 MG/ML ~~LOC~~ SOSY
60.0000 mg | PREFILLED_SYRINGE | Freq: Once | SUBCUTANEOUS | Status: AC
  Administered 2018-01-08: 60 mg via SUBCUTANEOUS

## 2018-01-08 NOTE — Patient Instructions (Signed)
Denosumab injection  What is this medicine?  DENOSUMAB (den oh sue mab) slows bone breakdown. Prolia is used to treat osteoporosis in women after menopause and in men. Xgeva is used to prevent bone fractures and other bone problems caused by cancer bone metastases. Xgeva is also used to treat giant cell tumor of the bone.  This medicine may be used for other purposes; ask your health care provider or pharmacist if you have questions.  What should I tell my health care provider before I take this medicine?  They need to know if you have any of these conditions:  -dental disease  -eczema  -infection or history of infections  -kidney disease or on dialysis  -low blood calcium or vitamin D  -malabsorption syndrome  -scheduled to have surgery or tooth extraction  -taking medicine that contains denosumab  -thyroid or parathyroid disease  -an unusual reaction to denosumab, other medicines, foods, dyes, or preservatives  -pregnant or trying to get pregnant  -breast-feeding  How should I use this medicine?  This medicine is for injection under the skin. It is given by a health care professional in a hospital or clinic setting.  If you are getting Prolia, a special MedGuide will be given to you by the pharmacist with each prescription and refill. Be sure to read this information carefully each time.  For Prolia, talk to your pediatrician regarding the use of this medicine in children. Special care may be needed. For Xgeva, talk to your pediatrician regarding the use of this medicine in children. While this drug may be prescribed for children as young as 13 years for selected conditions, precautions do apply.  Overdosage: If you think you have taken too much of this medicine contact a poison control center or emergency room at once.  NOTE: This medicine is only for you. Do not share this medicine with others.  What if I miss a dose?  It is important not to miss your dose. Call your doctor or health care professional if you are  unable to keep an appointment.  What may interact with this medicine?  Do not take this medicine with any of the following medications:  -other medicines containing denosumab  This medicine may also interact with the following medications:  -medicines that suppress the immune system  -medicines that treat cancer  -steroid medicines like prednisone or cortisone  This list may not describe all possible interactions. Give your health care provider a list of all the medicines, herbs, non-prescription drugs, or dietary supplements you use. Also tell them if you smoke, drink alcohol, or use illegal drugs. Some items may interact with your medicine.  What should I watch for while using this medicine?  Visit your doctor or health care professional for regular checks on your progress. Your doctor or health care professional may order blood tests and other tests to see how you are doing.  Call your doctor or health care professional if you get a cold or other infection while receiving this medicine. Do not treat yourself. This medicine may decrease your body's ability to fight infection.  You should make sure you get enough calcium and vitamin D while you are taking this medicine, unless your doctor tells you not to. Discuss the foods you eat and the vitamins you take with your health care professional.  See your dentist regularly. Brush and floss your teeth as directed. Before you have any dental work done, tell your dentist you are receiving this medicine.  Do   not become pregnant while taking this medicine or for 5 months after stopping it. Women should inform their doctor if they wish to become pregnant or think they might be pregnant. There is a potential for serious side effects to an unborn child. Talk to your health care professional or pharmacist for more information.  What side effects may I notice from receiving this medicine?  Side effects that you should report to your doctor or health care professional as soon as  possible:  -allergic reactions like skin rash, itching or hives, swelling of the face, lips, or tongue  -breathing problems  -chest pain  -fast, irregular heartbeat  -feeling faint or lightheaded, falls  -fever, chills, or any other sign of infection  -muscle spasms, tightening, or twitches  -numbness or tingling  -skin blisters or bumps, or is dry, peels, or red  -slow healing or unexplained pain in the mouth or jaw  -unusual bleeding or bruising  Side effects that usually do not require medical attention (Report these to your doctor or health care professional if they continue or are bothersome.):  -muscle pain  -stomach upset, gas  This list may not describe all possible side effects. Call your doctor for medical advice about side effects. You may report side effects to FDA at 1-800-FDA-1088.  Where should I keep my medicine?  This medicine is only given in a clinic, doctor's office, or other health care setting and will not be stored at home.  NOTE: This sheet is a summary. It may not cover all possible information. If you have questions about this medicine, talk to your doctor, pharmacist, or health care provider.      2016, Elsevier/Gold Standard. (2011-11-19 12:37:47)

## 2018-01-08 NOTE — Telephone Encounter (Signed)
Per 8/7 los.  Gave patient avs and calendar.

## 2018-01-09 NOTE — Progress Notes (Signed)
Cardiac Individual Treatment Plan  Patient Details  Name: [name] MRN: [id number] Date of Birth: 10-09-1934 Referring Provider:     CARDIAC REHAB PHASE II ORIENTATION from 10/10/2017 in [place]  Referring Provider  Bensimhon, Daniel      Initial Encounter Date:    CARDIAC REHAB PHASE II ORIENTATION from 10/10/2017 in [place]  Date  10/10/17      Visit Diagnosis: Stented coronary artery  ST elevation myocardial infarction (STEMI), unspecified artery (HCC)  Patient's Home Medications on Admission:  Current Outpatient Medications:  .  anastrozole (ARIMIDEX) 1 MG tablet, TAKE ONE TABLET BY MOUTH ONCE DAILY, Disp: 90 tablet, Rfl: 4 .  apixaban (ELIQUIS) 2.5 MG TABS tablet, Take 1 tablet (2.5 mg total) by mouth 2 (two) times daily., Disp: 60 tablet, Rfl: 6 .  atorvastatin (LIPITOR) 80 MG tablet, Take 1 tablet (80 mg total) by mouth daily at 6 PM., Disp: 30 tablet, Rfl: 6 .  Calcium Carb-Cholecalciferol (CALCIUM + D3) 600-800 MG-UNIT TABS, Take 1 tablet by mouth daily. , Disp: , Rfl:  .  Cholecalciferol (VITAMIN D-3) 5000 units TABS, Take by mouth., Disp: , Rfl:  .  citalopram (CELEXA) 10 MG tablet, Take 1 tablet (10 mg total) by mouth daily., Disp: 30 tablet, Rfl: 6 .  clopidogrel (PLAVIX) 75 MG tablet, Take 1 tablet (75 mg total) by mouth daily., Disp: 30 tablet, Rfl: 6 .  clotrimazole (MYCELEX) 10 MG troche, Take 1 tablet (10 mg total) by mouth 5 (five) times daily. Slowly dissolve in mouth, do not chew or swallow whole. Use ALL lozenges, Disp: 70 tablet, Rfl: 0 .  co-enzyme Q-10 30 MG capsule, Take 30 mg by mouth 3 (three) times daily., Disp: , Rfl:  .  fish oil-omega-3 fatty acids 1000 MG capsule, Take 1 g by mouth daily., Disp: , Rfl:  .  losartan (COZAAR) 25 MG tablet, Take 1 tablet (25 mg total) by mouth 2 (two) times daily., Disp: 60 tablet, Rfl: 6 .  magnesium gluconate (MAGONATE) 500 MG tablet, Take 500 mg  by mouth 2 (two) times daily., Disp: , Rfl:  .  Multiple Vitamin (MULTIVITAMIN) tablet, Take 1 tablet by mouth daily., Disp: , Rfl:  .  nitroGLYCERIN (NITROSTAT) 0.3 MG SL tablet, Place 1 tablet (0.3 mg total) under the tongue every 5 (five) minutes as needed for chest pain., Disp: 90 tablet, Rfl: 12 .  spironolactone (ALDACTONE) 25 MG tablet, Take 1 tablet (25 mg total) by mouth daily., Disp: 30 tablet, Rfl: 6  Past Medical History: Past Medical History:  Diagnosis Date  . Anxiety   . Breast cancer of lower-outer quadrant of left female breast ([place]) 10/11/2014   treated with lumpectomy and radiation  . Coronary artery disease 07/27/2017  . GERD (gastroesophageal reflux disease)   . Hypertension   . Myocardial infarction ([place]) 07/27/2017   STEMI of anterolateral wall. Cath w/ stent and cabg by Dr. Martinique  . Osteoporosis 10/20/2014  . Personal history of radiation therapy 2016   treatment of breast cancer  . PONV (postoperative nausea and vomiting)   . PUD (peptic ulcer disease)   . Pure hypercholesterolemia 04/08/2014  . Stone, kidney     Tobacco Use: Social History   Tobacco Use  Smoking Status Never Smoker  Smokeless Tobacco Never Used    Labs: Recent Review Scientist, physiological    Labs for ITP Cardiac and Pulmonary Rehab Latest Ref Rng & Units 08/01/2017 08/02/2017 08/03/2017  08/04/2017 08/05/2017   Cholestrol 0 - 200 mg/dL - - - - -   LDLCALC 0 - 99 mg/dL - - - - -   HDL >40 mg/dL - - - - -   Trlycerides <150 mg/dL - - - - -   Hemoglobin A1c 4.8 - 5.6 % - - - - -   PHART 7.350 - 7.450 - - - - -   PCO2ART 32.0 - 48.0 mmHg - - - - -   HCO3 20.0 - 28.0 mmol/L - - - - -   TCO2 22 - 32 mmol/L - - - - -   ACIDBASEDEF 0.0 - 2.0 mmol/L - - - - -   O2SAT % 59.3 53.6 56.3 65.5 61.5      Capillary Blood Glucose: Lab Results  Component Value Date   GLUCAP 117 (H) 08/05/2017   GLUCAP 122 (H) 08/05/2017   GLUCAP 103 (H) 08/05/2017   GLUCAP 109 (H) 08/05/2017   GLUCAP 147 (H) 08/04/2017      Exercise Target Goals:    Exercise Program Goal: Individual exercise prescription set using results from initial 6 min walk test and THRR while considering  patient's activity barriers and safety.   Exercise Prescription Goal: Initial exercise prescription builds to 30-45 minutes a day of aerobic activity, 2-3 days per week.  Home exercise guidelines will be given to patient during program as part of exercise prescription that the participant will acknowledge.  Activity Barriers & Risk Stratification: Activity Barriers & Cardiac Risk Stratification - 10/10/17 0933      Activity Barriers & Cardiac Risk Stratification   Activity Barriers  Muscular Weakness;Shortness of Breath;Deconditioning;Back Problems    Cardiac Risk Stratification  High       6 Minute Walk: 6 Minute Walk    Row Name 10/10/17 1212 01/03/18 1518       6 Minute Walk   Phase  Initial  Discharge    Distance  1200 feet  1463 feet    Distance % Change  -  21.92 %    Walk Time  6 minutes  6 minutes    # of Rest Breaks  0  0    MPH  2.3  2.77    METS  2  3.02    RPE  13  12    VO2 Peak  7.1  -    Symptoms  Yes (comment)  No    Comments  leg fatigue  -    Resting HR  53 bpm  68 bpm    Resting BP  112/52  128/72    Resting Oxygen Saturation   100 %  -    Exercise Oxygen Saturation  during 6 min walk  98 %  -    Max Ex. HR  66 bpm  84 bpm    Max Ex. BP  122/60  164/82    2 Minute Post BP  110/60  122/68       Oxygen Initial Assessment:   Oxygen Re-Evaluation:   Oxygen Discharge (Final Oxygen Re-Evaluation):   Initial Exercise Prescription: Initial Exercise Prescription - 10/10/17 1200      Date of Initial Exercise RX and Referring Provider   Date  10/10/17    Referring Provider  Bensimhon, Daniel      Recumbant Bike   Level  1.5    RPM  60    Watts  10    Minutes  10    METs  1.7  NuStep   Level  2    SPM  60    Minutes  10    METs  1.5      Track   Laps  7    Minutes  10     METs  2.23      Prescription Details   Frequency (times per week)  3    Duration  Progress to 30 minutes of continuous aerobic without signs/symptoms of physical distress      Intensity   THRR 40-80% of Max Heartrate  56-112    Ratings of Perceived Exertion  11-13    Perceived Dyspnea  0-4      Progression   Progression  Continue to progress workloads to maintain intensity without signs/symptoms of physical distress.      Resistance Training   Training Prescription  Yes    Weight  1lbs    Reps  10-15       Perform Capillary Blood Glucose checks as needed.  Exercise Prescription Changes: Exercise Prescription Changes    Row Name 10/16/17 1508 10/21/17 1452 11/04/17 1446 11/18/17 1448 12/02/17 1452     Response to Exercise   Blood Pressure (Admit)  132/72  126/68  130/70  122/60  122/68   Blood Pressure (Exercise)  138/72  126/80  122/62  122/60  122/68   Blood Pressure (Exit)  126/80  128/82  122/70  124/70  122/68   Heart Rate (Admit)  57 bpm  46 bpm  55 bpm  66 bpm  73 bpm   Heart Rate (Exercise)  76 bpm  68 bpm  89 bpm  80 bpm  89 bpm   Heart Rate (Exit)  57 bpm  55 bpm  61 bpm  60 bpm  73 bpm   Rating of Perceived Exertion (Exercise)  13  12  12  13  13    Symptoms  none  none  none  none  none   Duration  Progress to 30 minutes of  aerobic without signs/symptoms of physical distress  Progress to 30 minutes of  aerobic without signs/symptoms of physical distress  Progress to 30 minutes of  aerobic without signs/symptoms of physical distress  Progress to 30 minutes of  aerobic without signs/symptoms of physical distress  Progress to 30 minutes of  aerobic without signs/symptoms of physical distress   Intensity  THRR unchanged  THRR unchanged  THRR unchanged  THRR unchanged  THRR unchanged     Progression   Progression  Continue to progress workloads to maintain intensity without signs/symptoms of physical distress.  Continue to progress workloads to maintain intensity  without signs/symptoms of physical distress.  Continue to progress workloads to maintain intensity without signs/symptoms of physical distress.  Continue to progress workloads to maintain intensity without signs/symptoms of physical distress.  Continue to progress workloads to maintain intensity without signs/symptoms of physical distress.   Average METs  2.1  1.8  1.9  2.1  2.3     Resistance Training   Training Prescription  No Relaxation day, no weights.  Yes  Yes  Yes  Yes   Weight  -  1lbs  1lbs  1lbs  2lbs   Reps  -  10-15  10-15  10-15  10-15   Time  -  10 Minutes  10 Minutes  10 Minutes  10 Minutes     Interval Training   Interval Training  No  No  No  No  No  Recumbant Bike   Level  1.5  1.5  1.5  2.5  2.5   Minutes  10  10  10  10  10    METs  -  1.6  1.7  2  -     NuStep   Level  2  2  2  3  3    SPM  60  60  60  60  60   Minutes  10  10  10  10  10    METs  1.9  1.9  1.8  1.9  2.2     Track   Laps  7  6  7  8  8    Minutes  10  10  10  10  10    METs  2.22  2.03  2.23  2.39  2.39     Home Exercise Plan   Plans to continue exercise at  -  -  Home (comment)  Home (comment)  Home (comment)   Frequency  -  -  Add 1 additional day to program exercise sessions.  Add 1 additional day to program exercise sessions.  Add 1 additional day to program exercise sessions.   Initial Home Exercises Provided  -  -  11/04/17  11/04/17  11/04/17   Row Name 12/16/17 1453 12/30/17 1450           Response to Exercise   Blood Pressure (Admit)  140/72  118/64      Blood Pressure (Exercise)  128/68  118/64      Blood Pressure (Exit)  114/72  118/72      Heart Rate (Admit)  62 bpm  70 bpm      Heart Rate (Exercise)  97 bpm  92 bpm      Heart Rate (Exit)  63 bpm  70 bpm      Rating of Perceived Exertion (Exercise)  12  11      Symptoms  none  none      Duration  Progress to 30 minutes of  aerobic without signs/symptoms of physical distress  Progress to 30 minutes of  aerobic without  signs/symptoms of physical distress      Intensity  THRR unchanged  THRR unchanged        Progression   Progression  Continue to progress workloads to maintain intensity without signs/symptoms of physical distress.  Continue to progress workloads to maintain intensity without signs/symptoms of physical distress.      Average METs  2.2  2.2        Resistance Training   Training Prescription  No Nutrition consult  Yes      Weight  -  3lbs      Reps  -  10-15      Time  -  10 Minutes        Interval Training   Interval Training  No  No        Recumbant Bike   Level  2.5  2.5      Minutes  10  10      METs  -  2.2        NuStep   Level  3  3      SPM  60  60      Minutes  10  10      METs  2.2  2.2        Track   Laps  7  7  Minutes  10  10      METs  2.23  2.23        Home Exercise Plan   Plans to continue exercise at  Home (comment)  Home (comment)      Frequency  Add 1 additional day to program exercise sessions.  Add 1 additional day to program exercise sessions.      Initial Home Exercises Provided  11/04/17  11/04/17         Exercise Comments: Exercise Comments    Row Name 10/16/17 1508 10/21/17 1452 11/04/17 1500 11/18/17 1535 12/02/17 1541   Exercise Comments  Patient off to a good start with exercise.  Reviewed METs with patient.  Reviewed home exercise guidelines, METs, and goals with patient.  Reviewed METs with patient.  Reviewed METs and goals with patient.   [place] Name 12/16/17 1511 12/30/17 1450         Exercise Comments  Reviewed METs with patient. Pt will increase hand weights from 2lbs to 3lbs.  Reviewed METs and goals with patient.         Exercise Goals and Review: Exercise Goals    Row Name 10/10/17 0934             Exercise Goals   Increase Physical Activity  Yes       Intervention  Provide advice, education, support and counseling about physical activity/exercise needs.;Develop an individualized exercise prescription for aerobic and  resistive training based on initial evaluation findings, risk stratification, comorbidities and participant's personal goals.       Expected Outcomes  Short Term: Attend rehab on a regular basis to increase amount of physical activity.;Long Term: Add in home exercise to make exercise part of routine and to increase amount of physical activity.;Long Term: Exercising regularly at least 3-5 days a week.       Increase Strength and Stamina  Yes get back to gardening, household chores, cooking and baking       Intervention  Provide advice, education, support and counseling about physical activity/exercise needs.;Develop an individualized exercise prescription for aerobic and resistive training based on initial evaluation findings, risk stratification, comorbidities and participant's personal goals.       Expected Outcomes  Short Term: Increase workloads from initial exercise prescription for resistance, speed, and METs.;Short Term: Perform resistance training exercises routinely during rehab and add in resistance training at home;Long Term: Improve cardiorespiratory fitness, muscular endurance and strength as measured by increased METs and functional capacity (6MWT)       Able to understand and use rate of perceived exertion (RPE) scale  Yes       Intervention  Provide education and explanation on how to use RPE scale       Expected Outcomes  Short Term: Able to use RPE daily in rehab to express subjective intensity level;Long Term:  Able to use RPE to guide intensity level when exercising independently       Knowledge and understanding of Target Heart Rate Range (THRR)  Yes       Intervention  Provide education and explanation of THRR including how the numbers were predicted and where they are located for reference       Expected Outcomes  Short Term: Able to state/look up THRR;Long Term: Able to use THRR to govern intensity when exercising independently;Short Term: Able to use daily as guideline for intensity  in rehab       Able to check pulse independently  Yes  Intervention  Provide education and demonstration on how to check pulse in carotid and radial arteries.;Review the importance of being able to check your own pulse for safety during independent exercise       Expected Outcomes  Short Term: Able to explain why pulse checking is important during independent exercise;Long Term: Able to check pulse independently and accurately       Understanding of Exercise Prescription  Yes       Intervention  Provide education, explanation, and written materials on patient's individual exercise prescription       Expected Outcomes  Short Term: Able to explain program exercise prescription;Long Term: Able to explain home exercise prescription to exercise independently          Exercise Goals Re-Evaluation : Exercise Goals Re-Evaluation    Row Name 10/16/17 1648 11/04/17 1446 12/02/17 1541 12/30/17 1450 01/03/18 1549     Exercise Goal Re-Evaluation   Exercise Goals Review  Increase Physical Activity;Able to understand and use rate of perceived exertion (RPE) scale  Understanding of Exercise Prescription;Increase Physical Activity;Knowledge and understanding of Target Heart Rate Range (THRR)  Increase Physical Activity  Increase Physical Activity  Increase Strength and Stamina   Comments  Patient tolerated low intensity exercise without c/o. Patient understands and is able to use the RPE scale appropriately.  Reviewed home exercise guidelines with patient including THRR, RPE scale, and endpoints for exercise.   Patient states that she is walking 5 minutes 1-2 days/week. Encouraged pt to increase to 5 minutes, 3x's/day, 1-2 days/week, and pt is agreeable to this. Pt states that she feels a little stronger.  Patient is doing some walking at home on the days she doesn't attend [facility]. [facility].  Patient's functional capacity improved 22% as measured  by 6 MWT, strength improved 2% as measured by grip strength test.   Expected Outcomes  Increase workloads as tolerated to help achieve personal health and fitness goals.  Patient will add 1 additional day walking in addition to exercise at [facility].  Patient will increase walking duration at home from 5 minutes one time to 5 minutes 3x's, 1-2 days/week.  Increase workloads as tolerated.  Patient will [facility] at least 30 minutes[facility] and [facility] gains.       Discharge Exercise Prescription (Final Exercise Prescription Changes): Exercise Prescription Changes - 12/30/17 1450      Response to Exercise   Blood Pressure (Admit)  118/64    Blood Pressure (Exercise)  118/64    Blood Pressure (Exit)  118/72    Heart Rate (Admit)  70 bpm    Heart Rate (Exercise)  92 bpm    Heart Rate (Exit)  70 bpm    Rating of Perceived Exertion (Exercise)  11    Symptoms  none    Duration  Progress to 30 minutes of  aerobic without signs/symptoms of physical distress    Intensity  THRR unchanged      Progression   Progression  Continue to progress workloads to maintain intensity without signs/symptoms of physical distress.    Average METs  2.2      Resistance Training   Training Prescription  Yes    Weight  3lbs    Reps  10-15    Time  10 Minutes      Interval Training   Interval Training  No      Recumbant Bike  Level  2.5    Minutes  10    METs  2.2      NuStep   Level  3    SPM  60    Minutes  10    METs  2.2      Track   Laps  7    Minutes  10    METs  2.23      Home Exercise Plan   Plans to continue exercise at  Home (comment)    Frequency  Add 1 additional day to program exercise sessions.    Initial Home Exercises Provided  11/04/17       Nutrition:  Target Goals: Understanding of nutrition guidelines, daily intake of sodium 1500mg , cholesterol 200mg , calories 30% from fat and 7% or less from saturated fats, daily to have 5 or more servings of  fruits and vegetables.  Biometrics: Pre Biometrics - 10/10/17 1220      Pre Biometrics   Height  5\' 8"  (1.727 m)    Weight  72.1 kg    Waist Circumference  31.5 inches    Hip Circumference  39.5 inches    Waist to Hip Ratio  0.8 %    BMI (Calculated)  24.17    Triceps Skinfold  24 mm    % Body Fat  35.5 %    Grip Strength  25 kg    Flexibility  0 in    Single Leg Stand  4.97 seconds      Post Biometrics - 01/03/18 1524       Post  Biometrics   Waist Circumference  31.25 inches    Hip Circumference  38.75 inches    Waist to Hip Ratio  0.81 %    Triceps Skinfold  16 mm    Grip Strength  25.5 kg    Flexibility  0 in    Single Leg Stand  9.09 seconds       Nutrition Therapy Plan and Nutrition Goals: Nutrition Therapy & Goals - 10/10/17 1222      Nutrition Therapy   Diet  General, Healthful      Personal Nutrition Goals   Nutrition Goal  Pt to liberalize her diet until her appetite improves.     Personal Goal #2  Pt to identify food quantities necessary to achieve weight maintenance of ~160 lb at graduation from cardiac rehab.       Intervention Plan   Intervention  Prescribe, educate and counsel regarding individualized specific dietary modifications aiming towards targeted core components such as weight, hypertension, lipid management, diabetes, heart failure and other comorbidities.    Expected Outcomes  Short Term Goal: Understand basic principles of dietary content, such as calories, fat, sodium, cholesterol and nutrients.;Long Term Goal: Adherence to prescribed nutrition plan.       Nutrition Assessments: Nutrition Assessments - 10/10/17 1223      MEDFICTS Scores   Pre Score  21       Nutrition Goals Re-Evaluation:   Nutrition Goals Re-Evaluation:   Nutrition Goals Discharge (Final Nutrition Goals Re-Evaluation):   Psychosocial: Target Goals: Acknowledge presence or absence of significant depression and/or stress, maximize coping skills, provide  positive support system. Participant is able to verbalize types and ability to use techniques and skills needed for reducing stress and depression.  Initial Review & Psychosocial Screening: Initial Psych Review & Screening - 10/10/17 1342      Initial Review   Current issues with  None Identified  Family Dynamics   Good Support System?  Yes   [name] reports her spouse, family, and friends as sources of support for her.     Barriers   Psychosocial barriers to participate in program  There are no identifiable barriers or psychosocial needs.      Screening Interventions   Interventions  Encouraged to exercise       Quality of Life Scores: Quality of Life - 10/10/17 1221      Quality of Life Scores   Health/Function Pre  24.19 %    Socioeconomic Pre  27.86 %    Psych/Spiritual Pre  27.86 %    Family Pre  30 %    GLOBAL Pre  26.7 %      Scores of 19 and below usually indicate a poorer quality of life in these areas.  A difference of  2-3 points is a clinically meaningful difference.  A difference of 2-3 points in the total score of the Quality of Life Index has been associated with significant improvement in overall quality of life, self-image, physical symptoms, and general health in studies assessing change in quality of life.  PHQ-9: Recent Review Flowsheet Data    Depression screen [facility] 2/9 12/03/2017 10/14/2017 07/11/2017 02/28/2017 11/05/2016   Decreased Interest 0 0 0 0 0   Down, Depressed, Hopeless 0 0 0 0 0   PHQ - 2 Score 0 0 0 0 0     Interpretation of Total Score  Total Score Depression Severity:  1-4 = Minimal depression, 5-9 = Mild depression, 10-14 = Moderate depression, 15-19 = Moderately severe depression, 20-27 = Severe depression   Psychosocial Evaluation and Intervention:   Psychosocial Re-Evaluation: Psychosocial Re-Evaluation    [place] Name 11/14/17 1431 12/12/17 1555 01/09/18 1539         Psychosocial Re-Evaluation   Current issues with  None Identified   None Identified  None Identified     Interventions  Encouraged to attend Cardiac Rehabilitation for the exercise  Encouraged to attend Cardiac Rehabilitation for the exercise  Encouraged to attend Cardiac Rehabilitation for the exercise     Continue Psychosocial Services   No Follow up required  No Follow up required  No Follow up required        Psychosocial Discharge (Final Psychosocial Re-Evaluation): Psychosocial Re-Evaluation - 01/09/18 1539      Psychosocial Re-Evaluation   Current issues with  None Identified    Interventions  Encouraged to attend Cardiac Rehabilitation for the exercise    Continue Psychosocial Services   No Follow up required       Vocational Rehabilitation: Provide vocational rehab assistance to qualifying candidates.   Vocational Rehab Evaluation & Intervention: Vocational Rehab - 10/10/17 1340      Initial Vocational Rehab Evaluation & Intervention   Assessment shows need for Vocational Rehabilitation  No       Education: Education Goals: Education classes will be provided on a weekly basis, covering required topics. Participant will state understanding/return demonstration of topics presented.  Learning Barriers/Preferences: Learning Barriers/Preferences - 10/10/17 0933      Learning Barriers/Preferences   Learning Barriers  Sight    Learning Preferences  Skilled Demonstration;Verbal Instruction;Video;Written Material;Pictoral       Education Topics: Count Your Pulse:  -Group instruction provided by verbal instruction, demonstration, patient participation and written materials to support subject.  Instructors address importance of being able to find your pulse and how to count your pulse when at home without a heart monitor.  Patients get hands on experience counting their pulse with staff help and individually.   CARDIAC REHAB PHASE II EXERCISE from 12/18/2017 in [place]  Date  11/29/17  Educator  Jiles Garter   Instruction Review Code  2- Demonstrated Understanding      Heart Attack, Angina, and Risk Factor Modification:  -Group instruction provided by verbal instruction, video, and written materials to support subject.  Instructors address signs and symptoms of angina and heart attacks.    Also discuss risk factors for heart disease and how to make changes to improve heart health risk factors.   CARDIAC REHAB PHASE II EXERCISE from 12/18/2017 in [place]  Date  11/20/17  Instruction Review Code  2- Demonstrated Understanding      Functional Fitness:  -Group instruction provided by verbal instruction, demonstration, patient participation, and written materials to support subject.  Instructors address safety measures for doing things around the house.  Discuss how to get up and down off the floor, how to pick things up properly, how to safely get out of a chair without assistance, and balance training.   CARDIAC REHAB PHASE II EXERCISE from 12/18/2017 in [place]  Date  12/13/17  Instruction Review Code  2- Demonstrated Understanding      Meditation and Mindfulness:  -Group instruction provided by verbal instruction, patient participation, and written materials to support subject.  Instructor addresses importance of mindfulness and meditation practice to help reduce stress and improve awareness.  Instructor also leads participants through a meditation exercise.    CARDIAC REHAB PHASE II EXERCISE from 12/18/2017 in [place]  Date  12/18/17  Educator  Jeanella Craze  Instruction Review Code  2- Demonstrated Understanding      Stretching for Flexibility and Mobility:  -Group instruction provided by verbal instruction, patient participation, and written materials to support subject.  Instructors lead participants through series of stretches that are designed to increase flexibility thus improving  mobility.  These stretches are additional exercise for major muscle groups that are typically performed during regular warm up and cool down.   Hands Only CPR:  -Group verbal, video, and participation provides a basic overview of AHA guidelines for community CPR. Role-play of emergencies allow participants the opportunity to practice calling for help and chest compression technique with discussion of AED use.   Hypertension: -Group verbal and written instruction that provides a basic overview of hypertension including the most recent diagnostic guidelines, risk factor reduction with self-care instructions and medication management.    Nutrition I class: Heart Healthy Eating:  -Group instruction provided by PowerPoint slides, verbal discussion, and written materials to support subject matter. The instructor gives an explanation and review of the Therapeutic Lifestyle Changes diet recommendations, which includes a discussion on lipid goals, dietary fat, sodium, fiber, plant stanol/sterol esters, sugar, and the components of a well-balanced, healthy diet.   Nutrition II class: Lifestyle Skills:  -Group instruction provided by PowerPoint slides, verbal discussion, and written materials to support subject matter. The instructor gives an explanation and review of label reading, grocery shopping for heart health, heart healthy recipe modifications, and ways to make healthier choices when eating out.   Diabetes Question & Answer:  -Group instruction provided by PowerPoint slides, verbal discussion, and written materials to support subject matter. The instructor gives an explanation and review of diabetes co-morbidities, pre- and post-prandial blood glucose goals, pre-exercise blood glucose goals, signs, symptoms,  and treatment of hypoglycemia and hyperglycemia, and foot care basics.   Diabetes Blitz:  -Group instruction provided by PowerPoint slides, verbal discussion, and written materials to  support subject matter. The instructor gives an explanation and review of the physiology behind type 1 and type 2 diabetes, diabetes medications and rational behind using different medications, pre- and post-prandial blood glucose recommendations and Hemoglobin A1c goals, diabetes diet, and exercise including blood glucose guidelines for exercising safely.    Portion Distortion:  -Group instruction provided by PowerPoint slides, verbal discussion, written materials, and food models to support subject matter. The instructor gives an explanation of serving size versus portion size, changes in portions sizes over the last 20 years, and what consists of a serving from each food group.   CARDIAC REHAB PHASE II EXERCISE from 12/18/2017 in [place]  Date  10/23/17  Educator  RD  Instruction Review Code  2- Demonstrated Understanding      Stress Management:  -Group instruction provided by verbal instruction, video, and written materials to support subject matter.  Instructors review role of stress in heart disease and how to cope with stress positively.     CARDIAC REHAB PHASE II EXERCISE from 12/18/2017 in [place]  Date  10/30/17  Educator  Jiles Garter, RN  Instruction Review Code  2- Demonstrated Understanding      Exercising on Your Own:  -Group instruction provided by verbal instruction, power point, and written materials to support subject.  Instructors discuss benefits of exercise, components of exercise, frequency and intensity of exercise, and end points for exercise.  Also discuss use of nitroglycerin and activating EMS.  Review options of places to exercise outside of rehab.  Review guidelines for sex with heart disease.   CARDIAC REHAB PHASE II EXERCISE from 12/18/2017 in [place]  Date  11/27/17  Educator  EP  Instruction Review Code  2- Demonstrated Understanding      Cardiac Drugs  I:  -Group instruction provided by verbal instruction and written materials to support subject.  Instructor reviews cardiac drug classes: antiplatelets, anticoagulants, beta blockers, and statins.  Instructor discusses reasons, side effects, and lifestyle considerations for each drug class.   CARDIAC REHAB PHASE II EXERCISE from 12/18/2017 in [place]  Date  12/11/17  Educator  Pharmacist  Instruction Review Code  2- Demonstrated Understanding      Cardiac Drugs II:  -Group instruction provided by verbal instruction and written materials to support subject.  Instructor reviews cardiac drug classes: angiotensin converting enzyme inhibitors (ACE-I), angiotensin II receptor blockers (ARBs), nitrates, and calcium channel blockers.  Instructor discusses reasons, side effects, and lifestyle considerations for each drug class.   CARDIAC REHAB PHASE II EXERCISE from 12/18/2017 in [place]  Date  11/13/17  Educator  Pharmacist  Instruction Review Code  2- Demonstrated Understanding      Anatomy and Physiology of the Circulatory System:  Group verbal and written instruction and models provide basic cardiac anatomy and physiology, with the coronary electrical and arterial systems. Review of: AMI, Angina, Valve disease, Heart Failure, Peripheral Artery Disease, Cardiac Arrhythmia, Pacemakers, and the ICD.   CARDIAC REHAB PHASE II EXERCISE from 01/01/2018 in [place]  Date  01/01/18  Instruction Review Code  2- Demonstrated Understanding      Other Education:  -Group or individual verbal, written, or video instructions that support  the educational goals of the cardiac rehab program.   Holiday Eating Survival Tips:  -Group instruction provided by PowerPoint slides, verbal discussion, and written materials to support subject matter. The instructor gives patients tips, tricks, and techniques to help them  not only survive but enjoy the holidays despite the onslaught of food that accompanies the holidays.   Knowledge Questionnaire Score: Knowledge Questionnaire Score - 10/10/17 1210      Knowledge Questionnaire Score   Pre Score  17/24       Core Components/Risk Factors/Patient Goals at Admission: Personal Goals and Risk Factors at Admission - 11/14/17 1435      Core Components/Risk Factors/Patient Goals on Admission   Expected Outcomes  Improve functional capacity of life;Short term: Attendance in program 2-3 days a week with increased exercise capacity. Reported lower sodium intake. Reported increased fruit and vegetable intake. Reports medication compliance.;Short term: Daily weights obtained and reported for increase. Utilizing diuretic protocols set by physician.;Long term: Adoption of self-care skills and reduction of barriers for early signs and symptoms recognition and intervention leading to self-care maintenance.       Core Components/Risk Factors/Patient Goals Review:  Goals and Risk Factor Review    Row Name 11/14/17 1425 12/12/17 1555 01/09/18 1539         Core Components/Risk Factors/Patient Goals Review   Personal Goals Review  Heart Failure;Hypertension  Heart Failure;Hypertension  Heart Failure;Hypertension     Review  [name]'s vital signs have been stable at cardiac rehab. [name] reports feeling stronger since she has been partcipating in phase 2 cardiac rehab  [name]'s vital signs have been stable at cardiac rehab. [name] reports feeling stronger since she has been partcipating in phase 2 cardiac rehab  [name]'s vital signs have been stable at cardiac rehab. [name] reports feeling stronger since she has been partcipating in phase 2 cardiac rehab     Expected [place] will continue to partcipate in phase 2 cardiac rehab and take her medications as prescribed.  [name] will continue to partcipate in phase 2 cardiac rehab and take her medications as prescribed.   [name] will continue to partcipate in phase 2 cardiac rehab and take her medications as prescribed.        Core Components/Risk Factors/Patient Goals at Discharge (Final Review):  Goals and Risk Factor Review - 01/09/18 1539      Core Components/Risk Factors/Patient Goals Review   Personal Goals Review  Heart Failure;Hypertension    Review  [name]'s vital signs have been stable at cardiac rehab. [name] reports feeling stronger since she has been partcipating in phase 2 cardiac rehab    Expected [place] will continue to partcipate in phase 2 cardiac rehab and take her medications as prescribed.       ITP Comments: ITP Comments    Row Name 10/10/17 1209 10/17/17 1327 11/14/17 1424 12/12/17 1555 01/09/18 1538   ITP Comments  Dr. Fransico Him, Medical Director   30 day ITP comment. Patient started exercise at cardiac rehab today.  30 day ITP comment. Patient with good attendance and partcipation in phase 2 cardiac rehab  30 day ITP comment. Patient with good attendance and partcipation in phase 2 cardiac rehab  30 day ITP comment. Patient with good attendance and partcipation in phase 2 cardiac rehab      Comments: See ITP comments. [name] will complete cardiac rehab next week.Harrell Gave RN BSN

## 2018-01-10 ENCOUNTER — Encounter: Payer: Self-pay | Admitting: Family Medicine

## 2018-01-10 ENCOUNTER — Other Ambulatory Visit: Payer: Self-pay

## 2018-01-10 ENCOUNTER — Encounter (HOSPITAL_COMMUNITY): Payer: Medicare Other

## 2018-01-10 ENCOUNTER — Ambulatory Visit (INDEPENDENT_AMBULATORY_CARE_PROVIDER_SITE_OTHER): Payer: Medicare Other | Admitting: Family Medicine

## 2018-01-10 VITALS — BP 138/74 | HR 79 | Temp 98.2°F | Resp 16 | Ht 68.0 in | Wt 148.0 lb

## 2018-01-10 DIAGNOSIS — N184 Chronic kidney disease, stage 4 (severe): Secondary | ICD-10-CM | POA: Diagnosis not present

## 2018-01-10 DIAGNOSIS — R3121 Asymptomatic microscopic hematuria: Secondary | ICD-10-CM

## 2018-01-10 DIAGNOSIS — R7303 Prediabetes: Secondary | ICD-10-CM

## 2018-01-10 DIAGNOSIS — D631 Anemia in chronic kidney disease: Secondary | ICD-10-CM | POA: Diagnosis not present

## 2018-01-10 DIAGNOSIS — N189 Chronic kidney disease, unspecified: Secondary | ICD-10-CM

## 2018-01-10 DIAGNOSIS — M818 Other osteoporosis without current pathological fracture: Secondary | ICD-10-CM

## 2018-01-10 DIAGNOSIS — I10 Essential (primary) hypertension: Secondary | ICD-10-CM | POA: Diagnosis not present

## 2018-01-10 DIAGNOSIS — Z0000 Encounter for general adult medical examination without abnormal findings: Secondary | ICD-10-CM | POA: Diagnosis not present

## 2018-01-10 DIAGNOSIS — D649 Anemia, unspecified: Secondary | ICD-10-CM

## 2018-01-10 DIAGNOSIS — Z23 Encounter for immunization: Secondary | ICD-10-CM | POA: Diagnosis not present

## 2018-01-10 DIAGNOSIS — E7800 Pure hypercholesterolemia, unspecified: Secondary | ICD-10-CM

## 2018-01-10 DIAGNOSIS — R42 Dizziness and giddiness: Secondary | ICD-10-CM

## 2018-01-10 DIAGNOSIS — H6121 Impacted cerumen, right ear: Secondary | ICD-10-CM

## 2018-01-10 DIAGNOSIS — R634 Abnormal weight loss: Secondary | ICD-10-CM

## 2018-01-10 LAB — POCT URINALYSIS DIP (MANUAL ENTRY)
BILIRUBIN UA: NEGATIVE
Glucose, UA: NEGATIVE mg/dL
Ketones, POC UA: NEGATIVE mg/dL
NITRITE UA: NEGATIVE
PH UA: 7 (ref 5.0–8.0)
Spec Grav, UA: 1.015 (ref 1.010–1.025)
UROBILINOGEN UA: 0.2 U/dL

## 2018-01-10 MED ORDER — CLOTRIMAZOLE 10 MG MT TROC: 10.0000 mg | Freq: Every day | OROMUCOSAL | 0 refills | Status: DC

## 2018-01-10 NOTE — Patient Instructions (Addendum)
IF you received an x-ray today, you will receive an invoice from [facility]. Please contact [facility] at [phone] with questions or concerns regarding your invoice.   IF you received labwork today, you will receive an invoice from [facility]. Please contact [facility] at [phone] with questions or concerns regarding your invoice.   Our billing staff will not be able to assist you with questions regarding bills from these companies.  You will be contacted with the lab results as soon as they are available. The fastest way to get your results is to activate your My Chart account. Instructions are located on the last page of this paperwork. If you have not heard from [place] regarding the results in 2 weeks, please contact this office.     Oral Thrush, Adult Oral thrush, also called oral candidiasis, is a fungal infection that develops in the mouth and throat and on the tongue. It causes white patches to form on the mouth and tongue. Ritta Slot is most common in older adults, but it can occur at any age. Many cases of thrush are mild, but this infection can also be serious. Ritta Slot can be a repeated (recurrent) problem for certain people who have a weak body defense system (immune system). The weakness can be caused by chronic illnesses, or by taking medicines that limit the body's ability to fight infection. If a person has difficulty fighting infection, the fungus that causes thrush can spread through the body. This can cause life-threatening blood or organ infections. What are the causes? This condition is caused by a fungus (yeast) called Candida albicans.  This fungus is normally present in small amounts in the mouth and on other mucous membranes. It usually causes no harm.  If conditions are present that allow the fungus to grow without control, it invades surrounding tissues and becomes an infection.  Other Candida species can also lead to thrush (rare).  What  increases the risk? This condition is more likely to develop in:  People with a weakened immune system.  Older adults.  People with HIV (human immunodeficiency virus).  People with diabetes.  People with dry mouth (xerostomia).  Pregnant women.  People with poor dental care, especially people who have false teeth.  People who use antibiotic medicines.  What are the signs or symptoms? Symptoms of this condition can vary from mild and moderate to severe and persistent. Symptoms may include:  A burning feeling in the mouth and throat. This can occur at the start of a thrush infection.  White patches that stick to the mouth and tongue. The tissue around the patches may be red, raw, and painful. If rubbed (during tooth brushing, for example), the patches and the tissue of the mouth may bleed easily.  A bad taste in the mouth or difficulty tasting foods.  A cottony feeling in the mouth.  Pain during eating and swallowing.  Poor appetite.  Cracking at the corners of the mouth.  How is this diagnosed? This condition is diagnosed based on:  Physical exam. Your health care provider will look in your mouth.  Health history. Your health care provider will ask you questions about your health.  How is this treated? This condition is treated with medicines called antifungals, which prevent the growth of fungi. These medicines are either applied directly to the affected area (topical) or swallowed (oral). The treatment will depend on the severity of the condition. Mild thrush Mild cases of thrush may clear up with the use  of an antifungal mouth rinse or lozenges. Treatment usually lasts about 14 days. Moderate to severe thrush  More severe thrush infections that have spread to the esophagus are treated with an oral antifungal medicine. A topical antifungal medicine may also be used.  For some severe infections, treatment may need to continue for more than 14 days.  Oral  antifungal medicines are rarely used during pregnancy because they may be harmful to the unborn child. If you are pregnant, talk with your health care provider about options for treatment. Persistent or recurrent thrush For cases of thrush that do not go away or keep coming back:  Treatment may be needed twice as long as the symptoms last.  Treatment will include both oral and topical antifungal medicines.  People with a weakened immune system can take an antifungal medicine on a continuous basis to prevent thrush infections.  It is important to treat conditions that make a person more likely to get thrush, such as diabetes or HIV. Follow these instructions at home: Medicines  Take over-the-counter and prescription medicines only as told by your health care provider.  Talk with your health care provider about an over-the-counter medicine called gentian violet, which kills bacteria and fungi. Relieving soreness and discomfort To help reduce the discomfort of thrush:  Drink cold liquids such as water or iced tea.  Try flavored ice treats or frozen juices.  Eat foods that are easy to swallow, such as gelatin, ice cream, or custard.  Try drinking from a straw if the patches in your mouth are painful.  General instructions  Eat plain, unflavored yogurt as directed by your health care provider. Check the label to make sure the yogurt contains live cultures. This yogurt can help healthy bacteria to grow in the mouth and can stop the growth of the fungus that causes thrush.  If you wear dentures, remove the dentures before going to bed, brush them vigorously, and soak them in a cleaning solution as directed by your health care provider.  Rinse your mouth with a warm salt-water mixture several times a day. To make a salt-water mixture, completely dissolve 1/2-1 tsp of salt in 1 cup of warm water. Contact a health care provider if:  Your symptoms are getting worse or are not improving  within 7 days of starting treatment.  You have symptoms of a spreading infection, such as white patches on the skin outside of the mouth. This information is not intended to replace advice given to you by your health care provider. Make sure you discuss any questions you have with your health care provider. Document Released: 02/14/2004 Document Revised: 02/13/2016 Document Reviewed: 02/13/2016 Elsevier Interactive Patient Education  2017 [employer]  Orthostatic Hypotension Orthostatic hypotension is a sudden drop in blood pressure that happens when you quickly change positions, such as when you get up from a seated or lying position. Blood pressure is a measurement of how strongly, or weakly, your blood is pressing against the walls of your arteries. Arteries are blood vessels that carry blood from your heart throughout your body. When blood pressure is too low, you may not get enough blood to your brain or to the rest of your organs. This can cause weakness, light-headedness, rapid heartbeat, and fainting. This can last for just a few seconds or for up to a few minutes. Orthostatic hypotension is usually not a serious problem. However, if it happens frequently or gets worse, it may be a sign of something more serious. What are  the causes? This condition may be caused by:  Sudden changes in posture, such as standing up quickly after you have been sitting or lying down.  Blood loss.  Loss of body fluids (dehydration).  Heart problems.  Hormone (endocrine) problems.  Pregnancy.  Severe infection.  Lack of certain nutrients.  Severe allergic reactions (anaphylaxis).  Certain medicines, such as blood pressure medicine or medicines that make the body lose excess fluids (diuretics). Sometimes, this condition can be caused by not taking medicine as directed, such as taking too much of a certain medicine.  What increases the risk? Certain factors can make you more likely to develop  orthostatic hypotension, including:  Age. Risk increases as you get older.  Conditions that affect the heart or the central nervous system.  Taking certain medicines, such as blood pressure medicine or diuretics.  Being pregnant.  What are the signs or symptoms? Symptoms of this condition may include:  Weakness.  Light-headedness.  Dizziness.  Blurred vision.  Fatigue.  Rapid heartbeat.  Fainting, in severe cases.  How is this diagnosed? This condition is diagnosed based on:  Your medical history.  Your symptoms.  Your blood pressure measurement. Your health care provider will check your blood pressure when you are: ? Lying down. ? Sitting. ? Standing.  A blood pressure reading is recorded as two numbers, such as "120 over 80" (or 120/80). The first ("top") number is called the systolic pressure. It is a measure of the pressure in your arteries as your heart beats. The second ("bottom") number is called the diastolic pressure. It is a measure of the pressure in your arteries when your heart relaxes between beats. Blood pressure is measured in a unit called mm Hg. Healthy blood pressure for adults is 120/80. If your blood pressure is below 90/60, you may be diagnosed with hypotension. Other information or tests that may be used to diagnose orthostatic hypotension include:  Your other vital signs, such as your heart rate and temperature.  Blood tests.  Tilt table test. For this test, you will be safely secured to a table that moves you from a lying position to an upright position. Your heart rhythm and blood pressure will be monitored during the test.  How is this treated? Treatment for this condition may include:  Changing your diet. This may involve eating more salt (sodium) or drinking more water.  Taking medicines to raise your blood pressure.  Changing the dosage of certain medicines you are taking that might be lowering your blood pressure.  Wearing  compression stockings. These stockings help to prevent blood clots and reduce swelling in your legs.  In some cases, you may need to go to the hospital for:  Fluid replacement. This means you will receive fluids through an IV tube.  Blood replacement. This means you will receive donated blood through an IV tube (transfusion).  Treating an infection or heart problems, if this applies.  Monitoring. You may need to be monitored while medicines that you are taking wear off.  Follow these instructions at home: Eating and drinking   Drink enough fluid to keep your urine clear or pale yellow.  Eat a healthy diet and follow instructions from your health care provider about eating or drinking restrictions. A healthy diet includes: ? Fresh fruits and vegetables. ? Whole grains. ? Lean meats. ? Low-fat dairy products.  Eat extra salt only as directed. Do not add extra salt to your diet unless your health care provider told you to  do that.  Eat frequent, small meals.  Avoid standing up suddenly after eating. Medicines  Take over-the-counter and prescription medicines only as told by your health care provider. ? Follow instructions from your health care provider about changing the dosage of your current medicines, if this applies. ? Do not stop or adjust any of your medicines on your own. General instructions  Wear compression stockings as told by your health care provider.  Get up slowly from lying down or sitting positions. This gives your blood pressure a chance to adjust.  Avoid hot showers and excessive heat as directed by your health care provider.  Return to your normal activities as told by your health care provider. Ask your health care provider what activities are safe for you.  Do not use any products that contain nicotine or tobacco, such as cigarettes and e-cigarettes. If you need help quitting, ask your health care provider.  Keep all follow-up visits as told by your  health care provider. This is important. Contact a health care provider if:  You vomit.  You have diarrhea.  You have a fever for more than 2-3 days.  You feel more thirsty than usual.  You feel weak and tired. Get help right away if:  You have chest pain.  You have a fast or irregular heartbeat.  You develop numbness in any part of your body.  You cannot move your arms or your legs.  You have trouble speaking.  You become sweaty or feel lightheaded.  You faint.  You feel short of breath.  You have trouble staying awake.  You feel confused. This information is not intended to replace advice given to you by your health care provider. Make sure you discuss any questions you have with your health care provider. Document Released: 05/11/2002 Document Revised: 02/07/2016 Document Reviewed: 11/11/2015 Elsevier Interactive Patient Education  2018 [employer] protect organs, store calcium, and anchor muscles. Good health habits, such as eating nutritious foods and exercising regularly, are important for maintaining healthy bones. They can also help to prevent a condition that causes bones to lose density and become weak and brittle (osteoporosis). Why is bone mass important? Bone mass refers to the amount of bone tissue that you have. The higher your bone mass, the stronger your bones. An important step toward having healthy bones throughout life is to have strong and dense bones during childhood. A young adult who has a high bone mass is more likely to have a high bone mass later in life. Bone mass at its greatest it is called peak bone mass. A large decline in bone mass occurs in older adults. In women, it occurs about the time of menopause. During this time, it is important to practice good health habits, because if more bone is lost than what is replaced, the bones will become less healthy and more likely to break (fracture). If you find that you have a low  bone mass, you may be able to prevent osteoporosis or further bone loss by changing your diet and lifestyle. How can I find out if my bone mass is low? Bone mass can be measured with an X-ray test that is called a bone mineral density (BMD) test. This test is recommended for all women who are age 74 or older. It may also be recommended for men who are age 67 or older, or for people who are more likely to develop osteoporosis due to:  Having bones that break easily.  Having a long-term disease that weakens bones, such as kidney disease or rheumatoid arthritis.  Having menopause earlier than normal.  Taking medicine that weakens bones, such as steroids, thyroid hormones, or hormone treatment for breast cancer or prostate cancer.  Smoking.  Drinking three or more alcoholic drinks each day.  What are the nutritional recommendations for healthy bones? To have healthy bones, you need to get enough of the right minerals and vitamins. Most nutrition experts recommend getting these nutrients from the foods that you eat. Nutritional recommendations vary from person to person. Ask your health care provider what is healthy for you. Here are some general guidelines. Calcium Recommendations Calcium is the most important (essential) mineral for bone health. Most people can get enough calcium from their diet, but supplements may be recommended for people who are at risk for osteoporosis. Good sources of calcium include:  Dairy products, such as low-fat or nonfat milk, cheese, and yogurt.  Dark green leafy vegetables, such as bok choy and broccoli.  Calcium-fortified foods, such as orange juice, cereal, bread, soy beverages, and tofu products.  Nuts, such as almonds.  Follow these recommended amounts for daily calcium intake:  Children, age 20?3: 700 mg.  Children, age 10?8: 1,000 mg.  Children, age 59?13: 1,300 mg.  Teens, age 52?18: 1,300 mg.  Adults, age 64?50: 1,000 mg.  Adults, age  64?70: ? Men: 1,000 mg. ? Women: 1,200 mg.  Adults, age 104 or older: 1,200 mg.  Pregnant and breastfeeding females: ? Teens: 1,300 mg. ? Adults: 1,000 mg.  Vitamin D Recommendations Vitamin D is the most essential vitamin for bone health. It helps the body to absorb calcium. Sunlight stimulates the skin to make vitamin D, so be sure to get enough sunlight. If you live in a cold climate or you do not get outside often, your health care provider may recommend that you take vitamin D supplements. Good sources of vitamin D in your diet include:  Egg yolks.  Saltwater fish.  Milk and cereal fortified with vitamin D.  Follow these recommended amounts for daily vitamin D intake:  Children and teens, age 51?18: 78 international units.  Adults, age 89 or younger: 400-800 international units.  Adults, age 87 or older: 800-1,000 international units.  Other Nutrients Other nutrients for bone health include:  Phosphorus. This mineral is found in meat, poultry, dairy foods, nuts, and legumes. The recommended daily intake for adult men and adult women is 700 mg.  Magnesium. This mineral is found in seeds, nuts, dark green vegetables, and legumes. The recommended daily intake for adult men is 400?420 mg. For adult women, it is 310?320 mg.  Vitamin K. This vitamin is found in green leafy vegetables. The recommended daily intake is 120 mg for adult men and 90 mg for adult women.  What type of physical activity is best for building and maintaining healthy bones? Weight-bearing and strength-building activities are important for building and maintaining peak bone mass. Weight-bearing activities cause muscles and bones to work against gravity. Strength-building activities increases muscle strength that supports bones. Weight-bearing and muscle-building activities include:  Walking and hiking.  Jogging and running.  Dancing.  Gym exercises.  Lifting weights.  Tennis and  racquetball.  Climbing stairs.  Aerobics.  Adults should get at least 30 minutes of moderate physical activity on most days. Children should get at least 60 minutes of moderate physical activity on most days. Ask your health care provide what type of exercise is best for you. Where can  I find more information? For more information, check out the following websites:  [place]: [employer]se  [employer] of [employer]: http://www.niams.AnonymousEar.fr.asp  This information is not intended to replace advice given to you by your health care provider. Make sure you discuss any questions you have with your health care provider. Document Released: 08/11/2003 Document Revised: 12/09/2015 Document Reviewed: 05/26/2014 Elsevier Interactive Patient Education  [employer].

## 2018-01-10 NOTE — Progress Notes (Signed)
Subjective:    Patient ID: [name], female    DOB: 26-Jul-1934, 83 y.o.   MRN: [id number] Chief Complaint  Patient presents with  . Annual Exam    HPI She was bending over for a while the other day in front of the fridge when she stood up quickly she felt dizzy for a minute when she stood up quickly and felt like it oculd be one of her BP meds. Primary Preventative Screenings: Cervical Cancer:  Family Planning: STI screening: Breast Cancer: Colorectal Cancer: Tobacco use/EtOH/substances: Bone Density: Cardiac: Weight/Blood sugar/Diet/Exercise: BMI Readings from Last 3 Encounters:  01/10/18 22.50 kg/m  01/08/18 22.32 kg/m  12/23/17 22.28 kg/m   Lab Results  Component Value Date   HGBA1C 5.8 (H) 07/28/2017   OTC/Vit/Supp/Herbal: Dentist/Optho: Immunizations:  Immunization History  Administered Date(s) Administered  . Hepatitis B 07/15/2004, 08/19/2004, 01/12/2005  . Pneumococcal Conjugate-13 11/05/2016  . Td 07/15/2004, 09/09/2014     Chronic Medical Conditions:  A little swelling today as she ate popcorn and salty tortillas yest as her duaghter was vising but typically has not been having any swelling Claudication.    Past Medical History:  Diagnosis Date  . Anxiety   . Breast cancer of lower-outer quadrant of left female breast ([place]) 10/11/2014   treated with lumpectomy and radiation  . Coronary artery disease 07/27/2017  . GERD (gastroesophageal reflux disease)   . Hypertension   . Myocardial infarction ([place]) 07/27/2017   STEMI of anterolateral wall. Cath w/ stent and cabg by Dr. Martinique  . Osteoporosis 10/20/2014  . Personal history of radiation therapy 2016   treatment of breast cancer  . PONV (postoperative nausea and vomiting)   . PUD (peptic ulcer disease)   . Pure hypercholesterolemia 04/08/2014  . Stone, kidney    Past Surgical History:  Procedure Laterality Date  . BREAST LUMPECTOMY WITH SENTINEL LYMPH NODE BIOPSY Left 10/22/2014   Procedure: BREAST LUMPECTOMY WITH SENTINEL LYMPH NODE BIOPSY;  Surgeon: Autumn Messing III, MD;  Location: [facility];  Service: General;  Laterality: Left;  . CORONARY STENT INTERVENTION N/A 07/27/2017   Procedure: CORONARY STENT INTERVENTION;  Surgeon: Martinique, Peter M, MD;  Location: [facility];  Service: Cardiovascular;  Laterality: N/A;  . CORONARY/GRAFT ACUTE MI REVASCULARIZATION N/A 07/27/2017   Procedure: Coronary/Graft Acute MI Revascularization;  Surgeon: Martinique, Peter M, MD;  Location: [facility];  Service: Cardiovascular;  Laterality: N/A;  . LEFT HEART CATH AND CORONARY ANGIOGRAPHY N/A 07/27/2017   Procedure: LEFT HEART CATH AND CORONARY ANGIOGRAPHY;  Surgeon: Martinique, Peter M, MD;  Location: [facility];  Service: Cardiovascular;  Laterality: N/A;  . RE-EXCISION OF BREAST CANCER,SUPERIOR MARGINS Left 11/04/2014   Procedure: RE-EXCISION OF LEFT BREAST INFERIOR MARGINS;  Surgeon: Autumn Messing III, MD;  Location: [facility];  Service: General;  Laterality: Left;   Current Outpatient Medications on File Prior to Visit  Medication Sig Dispense Refill  . anastrozole (ARIMIDEX) 1 MG tablet TAKE ONE TABLET BY MOUTH ONCE DAILY 90 tablet 4  . apixaban (ELIQUIS) 2.5 MG TABS tablet Take 1 tablet (2.5 mg total) by mouth 2 (two) times daily. 60 tablet 6  . atorvastatin (LIPITOR) 80 MG tablet Take 1 tablet (80 mg total) by mouth daily at 6 PM. 30 tablet 6  . Calcium Carb-Cholecalciferol (CALCIUM + D3) 600-800 MG-UNIT TABS Take 1 tablet by mouth daily.     . Cholecalciferol (VITAMIN D-3) 5000 units TABS Take by mouth.    Marland Kitchen  citalopram (CELEXA) 10 MG tablet Take 1 tablet (10 mg total) by mouth daily. 30 tablet 6  . clopidogrel (PLAVIX) 75 MG tablet Take 1 tablet (75 mg total) by mouth daily. 30 tablet 6  . clotrimazole (MYCELEX) 10 MG troche Take 1 tablet (10 mg total) by mouth 5 (five) times daily. Slowly dissolve in mouth, do not chew or swallow whole. Use ALL lozenges 70 tablet 0    . co-enzyme Q-10 30 MG capsule Take 30 mg by mouth 3 (three) times daily.    . fish oil-omega-3 fatty acids 1000 MG capsule Take 1 g by mouth daily.    Marland Kitchen losartan (COZAAR) 25 MG tablet Take 1 tablet (25 mg total) by mouth 2 (two) times daily. 60 tablet 6  . magnesium gluconate (MAGONATE) 500 MG tablet Take 500 mg by mouth 2 (two) times daily.    . Multiple Vitamin (MULTIVITAMIN) tablet Take 1 tablet by mouth daily.    . nitroGLYCERIN (NITROSTAT) 0.3 MG SL tablet Place 1 tablet (0.3 mg total) under the tongue every 5 (five) minutes as needed for chest pain. 90 tablet 12  . spironolactone (ALDACTONE) 25 MG tablet Take 1 tablet (25 mg total) by mouth daily. 30 tablet 6   No current facility-administered medications on file prior to visit.    Allergies  Allergen Reactions  . Lisinopril Cough   Family History  Problem Relation Age of Onset  . Pancreatic cancer Mother        pancreastic  . Stroke Father   . Hypertension Brother   . Hypertension Sister   . Hypertension Brother   . Heart disease Brother   . Hypertension Sister    Social History   Socioeconomic History  . Marital status: Married    Spouse name: Not on file  . Number of children: Not on file  . Years of education: Not on file  . Highest education level: Not on file  Occupational History  . Occupation: retired  Scientific laboratory technician  . Financial resource strain: Not on file  . Food insecurity:    Worry: Not on file    Inability: Not on file  . Transportation needs:    Medical: Not on file    Non-medical: Not on file  Tobacco Use  . Smoking status: Never Smoker  . Smokeless tobacco: Never Used  Substance and Sexual Activity  . Alcohol use: No  . Drug use: No  . Sexual activity: Not on file  Lifestyle  . Physical activity:    Days per week: Not on file    Minutes per session: Not on file  . Stress: Not on file  Relationships  . Social connections:    Talks on phone: Not on file    Gets together: Not on file     Attends religious service: Not on file    Active member of club or organization: Not on file    Attends meetings of clubs or organizations: Not on file    Relationship status: Not on file  Other Topics Concern  . Not on file  Social History Narrative  . Not on file   Depression screen [facility] 2/9 01/10/2018 12/03/2017 10/14/2017 07/11/2017 02/28/2017  Decreased Interest 0 0 0 0 0  Down, Depressed, Hopeless 0 0 0 0 0  PHQ - 2 Score 0 0 0 0 0      Review of Systems See hpi    Objective:   Physical Exam  Constitutional: She is oriented to person, place,  and time. She appears well-developed and well-nourished. No distress.  HENT:  Head: Normocephalic and atraumatic.  Right Ear: Tympanic membrane and external ear normal.  Left Ear: Tympanic membrane, external ear and ear canal normal.  Nose: Mucosal edema present. No rhinorrhea.  Mouth/Throat: Uvula is midline, oropharynx is clear and moist and mucous membranes are normal. No oropharyngeal exudate or posterior oropharyngeal erythema.  Rt TM obstructed - unable to view due to cerumen Tongue with pale coating in very posterior center  Eyes: Pupils are equal, round, and reactive to light. Conjunctivae and EOM are normal. Right eye exhibits no discharge. Left eye exhibits no discharge. No scleral icterus.  Neck: Normal range of motion. Neck supple. No thyromegaly present.  Cardiovascular: Intact distal pulses. An irregularly irregular rhythm present. Bradycardia present.  Murmur (LLSB) heard.  Systolic murmur is present with a grade of 2/6. Pulmonary/Chest: Effort normal and breath sounds normal. No respiratory distress. No breast tenderness, discharge or bleeding. Breasts are asymmetrical.  S/p left mastectomy so right breast with dense well defined mobile breast tissue  Abdominal: Soft. Bowel sounds are normal. There is no tenderness. Hernia confirmed negative in the right inguinal area and confirmed negative in the left inguinal area.    Genitourinary: Uterus normal. No breast tenderness, discharge or bleeding. There is no rash, tenderness or lesion on the right labia. There is no rash or tenderness on the left labia. Uterus is not enlarged, not fixed and not tender. Cervix exhibits no motion tenderness. Right adnexum displays no mass, no tenderness and no fullness. Left adnexum displays no mass and no tenderness.  Genitourinary Comments: Bimanual exam only done  Musculoskeletal: She exhibits no edema.  Lymphadenopathy:    She has no cervical adenopathy. No inguinal adenopathy noted on the right or left side.  Neurological: She is alert and oriented to person, place, and time. She has normal reflexes.  Skin: Skin is warm and dry. She is not diaphoretic. No erythema.  Psychiatric: She has a normal mood and affect. Her behavior is normal.    Left lower ext 1+  Pulse 79   Temp 98.2 F (36.8 C) (Oral)   Resp 16   Ht 5\' 8"  (1.727 m)   Wt 148 lb (67.1 kg)   SpO2 97%   BMI 22.50 kg/m      Results for orders placed or performed in visit on 01/10/18  POCT urinalysis dipstick  Result Value Ref Range   Color, UA yellow yellow   Clarity, UA clear clear   Glucose, UA negative negative mg/dL   Bilirubin, UA negative negative   Ketones, POC UA negative negative mg/dL   Spec Grav, UA 1.015 1.010 - 1.025   Blood, UA moderate (A) negative   pH, UA 7.0 5.0 - 8.0   Protein Ur, POC trace (A) negative mg/dL   Urobilinogen, UA 0.2 0.2 or 1.0 E.U./dL   Nitrite, UA Negative Negative   Leukocytes, UA Small (1+) (A) Negative   Orthostatic VS for the past 24 hrs:  BP- Lying Pulse- Lying BP- Sitting Pulse- Sitting BP- Standing at 0 minutes Pulse- Standing at 0 minutes  01/10/18 1048 150/67 64 154/74 66 153/71 64     Assessment & Plan:  Normal urine IFE 07/30/2017 07/30/17 SPEP shows acute inflammatory response  - infection trauma, cardiac failure. No sig gamma globulin or monoclonal protein.  She did have THRUSH at the 12/03/2017- I  don't think this was ever treated  Set up to see dr on 8/26 about  claudication type sxs. Dr. Haroldine Laws lower ext arterial  Advised to get high dose senior flu shot at pharmacy though usu she does not take the flu shot  Refer to nephrology  Last DEXA 09/28/16 - is someone treating her osteoporosis - Dr. Nat Math  Counseled to schedule mammogram  Advised orthostatic due to age and BP meds so to drink a lot of water, high protein diet,  And move slowly.  1. Annual physical exam   2. HTN (hypertension), benign   3. Other osteoporosis without current pathological fracture   4. Chronic kidney disease, stage IV (severe) ([place])   5. Anemia associated with chronic renal failure   6. Pure hypercholesterolemia   7. Unintentional weight loss   8. Hypercalcemia   9. Anemia, unspecified type   10. Prediabetes   11. Right ear impacted cerumen   12. Orthostatic lightheadedness   13. Asymptomatic microscopic hematuria     Orders Placed This Encounter  Procedures  . Pneumococcal polysaccharide vaccine 23-valent greater than or equal to 2yo subcutaneous/IM  . Iron, TIBC and Ferritin Panel  . Reticulocytes  . Vitamin B12  . Hemoglobin A1c  . PTH, Intact and Calcium  . Lipid panel    Order Specific Question:   Has the patient fasted?    Answer:   Yes  . Ambulatory referral to Nephrology    Referral Priority:   Routine    Referral Type:   Consultation    Referral Reason:   Specialty Services Required    Requested Specialty:   Nephrology    Number of Visits Requested:   1  . Orthostatic vital signs  . Ear wax removal  . POCT urinalysis dipstick    Meds ordered this encounter  Medications  . clotrimazole (MYCELEX) 10 MG troche    Sig: Take 1 tablet (10 mg total) by mouth 5 (five) times daily. Slowly dissolve in mouth, do not chew or swallow whole. Use ALL lozenges    Dispense:  70 tablet    Refill:  0    Delman Cheadle, MD, MPH [facility] [place], [place]  [address] [phone] Office phone  [phone] Office fax   03/24/18 7:19 AM

## 2018-01-11 LAB — IRON,TIBC AND FERRITIN PANEL
Ferritin: 118 ng/mL (ref 15–150)
Iron Saturation: 15 % (ref 15–55)
Iron: 43 ug/dL (ref 27–139)
Total Iron Binding Capacity: 287 ug/dL (ref 250–450)
UIBC: 244 ug/dL (ref 118–369)

## 2018-01-11 LAB — PTH, INTACT AND CALCIUM
Calcium: 10.3 mg/dL (ref 8.7–10.3)
PTH: 134 pg/mL — ABNORMAL HIGH (ref 15–65)

## 2018-01-11 LAB — LIPID PANEL
CHOL/HDL RATIO: 2.1 ratio (ref 0.0–4.4)
Cholesterol, Total: 156 mg/dL (ref 100–199)
HDL: 74 mg/dL (ref >39)
LDL CALC: 70 mg/dL (ref 0–99)
Triglycerides: 61 mg/dL (ref 0–149)
VLDL CHOLESTEROL CAL: 12 mg/dL (ref 5–40)

## 2018-01-11 LAB — HEMOGLOBIN A1C
ESTIMATED AVERAGE GLUCOSE: 117 mg/dL
Hgb A1c MFr Bld: 5.7 % — ABNORMAL HIGH (ref 4.8–5.6)

## 2018-01-11 LAB — VITAMIN B12: Vitamin B-12: 1043 pg/mL (ref 232–1245)

## 2018-01-11 LAB — RETICULOCYTES: RETIC CT PCT: 1.2 % (ref 0.6–2.6)

## 2018-01-13 ENCOUNTER — Encounter (HOSPITAL_COMMUNITY): Payer: Medicare Other

## 2018-01-13 ENCOUNTER — Encounter: Payer: Medicare Other | Admitting: Family Medicine

## 2018-01-13 ENCOUNTER — Encounter (HOSPITAL_COMMUNITY)
Admission: RE | Admit: 2018-01-13 | Discharge: 2018-01-13 | Disposition: A | Discharge status: Home / Self Care | Payer: Medicare Other | Source: Ambulatory Visit | Attending: Internal Medicine | Admitting: Internal Medicine

## 2018-01-13 DIAGNOSIS — I10 Essential (primary) hypertension: Secondary | ICD-10-CM | POA: Diagnosis not present

## 2018-01-13 DIAGNOSIS — Z7902 Long term (current) use of antithrombotics/antiplatelets: Secondary | ICD-10-CM | POA: Diagnosis not present

## 2018-01-13 DIAGNOSIS — K219 Gastro-esophageal reflux disease without esophagitis: Secondary | ICD-10-CM | POA: Diagnosis not present

## 2018-01-13 DIAGNOSIS — I213 ST elevation (STEMI) myocardial infarction of unspecified site: Secondary | ICD-10-CM

## 2018-01-13 DIAGNOSIS — Z7901 Long term (current) use of anticoagulants: Secondary | ICD-10-CM | POA: Diagnosis not present

## 2018-01-13 DIAGNOSIS — Z955 Presence of coronary angioplasty implant and graft: Secondary | ICD-10-CM | POA: Diagnosis not present

## 2018-01-13 DIAGNOSIS — Z79899 Other long term (current) drug therapy: Secondary | ICD-10-CM | POA: Diagnosis not present

## 2018-01-14 ENCOUNTER — Telehealth (HOSPITAL_COMMUNITY): Payer: Self-pay | Admitting: *Deleted

## 2018-01-14 MED ORDER — FUROSEMIDE 20 MG PO TABS: 20.0000 mg | ORAL_TABLET | ORAL | 3 refills | Status: DC | PRN

## 2018-01-14 NOTE — Telephone Encounter (Signed)
Pt called to report some SOB, she states it occurred last night while she was laying in the bed, she states she had to prop herself up on pillows and that helped some.  She states today she has felt ok, just more tired and weak than usual.  She states wt is stable at 147 lbs, she usually runs 146 lbs, she denies any swelling and her breathing seems to be ok today.  Per last OV note pt has Lasix she can take as needed.  Pt states she does have some Lasix 20 mg tabs at home, she will take one today and one tomorrow, if still not feeling well she will call us back.

## 2018-01-15 ENCOUNTER — Encounter (HOSPITAL_COMMUNITY)
Admission: RE | Admit: 2018-01-15 | Discharge: 2018-01-15 | Disposition: A | Discharge status: Home / Self Care | Payer: Medicare Other | Source: Ambulatory Visit | Attending: Internal Medicine | Admitting: Internal Medicine

## 2018-01-15 ENCOUNTER — Encounter (HOSPITAL_COMMUNITY): Payer: Medicare Other

## 2018-01-15 DIAGNOSIS — Z955 Presence of coronary angioplasty implant and graft: Secondary | ICD-10-CM

## 2018-01-15 DIAGNOSIS — K219 Gastro-esophageal reflux disease without esophagitis: Secondary | ICD-10-CM | POA: Diagnosis not present

## 2018-01-15 DIAGNOSIS — Z7902 Long term (current) use of antithrombotics/antiplatelets: Secondary | ICD-10-CM | POA: Diagnosis not present

## 2018-01-15 DIAGNOSIS — I10 Essential (primary) hypertension: Secondary | ICD-10-CM | POA: Diagnosis not present

## 2018-01-15 DIAGNOSIS — Z7901 Long term (current) use of anticoagulants: Secondary | ICD-10-CM | POA: Diagnosis not present

## 2018-01-15 DIAGNOSIS — I213 ST elevation (STEMI) myocardial infarction of unspecified site: Secondary | ICD-10-CM | POA: Diagnosis not present

## 2018-01-15 DIAGNOSIS — Z79899 Other long term (current) drug therapy: Secondary | ICD-10-CM | POA: Diagnosis not present

## 2018-01-16 ENCOUNTER — Encounter (HOSPITAL_COMMUNITY): Payer: Medicare Other

## 2018-01-17 ENCOUNTER — Telehealth: Payer: Self-pay | Admitting: Family Medicine

## 2018-01-17 ENCOUNTER — Encounter (HOSPITAL_COMMUNITY)
Admission: RE | Admit: 2018-01-17 | Discharge: 2018-01-17 | Disposition: A | Discharge status: Home / Self Care | Payer: Medicare Other | Source: Ambulatory Visit | Attending: Internal Medicine | Admitting: Internal Medicine

## 2018-01-17 VITALS — BP 124/72 | HR 82 | Ht 68.0 in | Wt 146.8 lb

## 2018-01-17 DIAGNOSIS — Z7901 Long term (current) use of anticoagulants: Secondary | ICD-10-CM | POA: Diagnosis not present

## 2018-01-17 DIAGNOSIS — Z79899 Other long term (current) drug therapy: Secondary | ICD-10-CM | POA: Diagnosis not present

## 2018-01-17 DIAGNOSIS — I10 Essential (primary) hypertension: Secondary | ICD-10-CM | POA: Diagnosis not present

## 2018-01-17 DIAGNOSIS — K219 Gastro-esophageal reflux disease without esophagitis: Secondary | ICD-10-CM | POA: Diagnosis not present

## 2018-01-17 DIAGNOSIS — Z7902 Long term (current) use of antithrombotics/antiplatelets: Secondary | ICD-10-CM | POA: Diagnosis not present

## 2018-01-17 DIAGNOSIS — Z955 Presence of coronary angioplasty implant and graft: Secondary | ICD-10-CM

## 2018-01-17 DIAGNOSIS — I213 ST elevation (STEMI) myocardial infarction of unspecified site: Secondary | ICD-10-CM

## 2018-01-17 NOTE — Progress Notes (Signed)
Discharge Progress Report  Patient Details  Name: [name] MRN: [id number] Date of Birth: Oct 02, 1934 Referring Provider:     CARDIAC REHAB PHASE II ORIENTATION from 10/10/2017 in [facility]  Referring Provider  Bensimhon, Quillian Quince       Number of Visits: 36  Reason for Discharge:  Patient reached a stable level of exercise. Patient independent in their exercise. Patient has met program and personal goals.  Smoking History:  Social History   Tobacco Use  Smoking Status Never Smoker  Smokeless Tobacco Never Used    Diagnosis:  ST elevation myocardial infarction (STEMI), unspecified artery (HCC)  Stented coronary artery  ADL UCSD:   Initial Exercise Prescription: Initial Exercise Prescription - 10/10/17 1200      Date of Initial Exercise RX and Referring Provider   Date  10/10/17    Referring Provider  Bensimhon, Daniel      Recumbant Bike   Level  1.5    RPM  60    Watts  10    Minutes  10    METs  1.7      NuStep   Level  2    SPM  60    Minutes  10    METs  1.5      Track   Laps  7    Minutes  10    METs  2.23      Prescription Details   Frequency (times per week)  3    Duration  Progress to 30 minutes of continuous aerobic without signs/symptoms of physical distress      Intensity   THRR 40-80% of Max Heartrate  56-112    Ratings of Perceived Exertion  11-13    Perceived Dyspnea  0-4      Progression   Progression  Continue to progress workloads to maintain intensity without signs/symptoms of physical distress.      Resistance Training   Training Prescription  Yes    Weight  1lbs    Reps  10-15       Discharge Exercise Prescription (Final Exercise Prescription Changes): Exercise Prescription Changes - 01/17/18 1600      Response to Exercise   Blood Pressure (Admit)  124/72    Blood Pressure (Exercise)  124/82    Blood Pressure (Exit)  130/68    Heart Rate (Admit)  82 bpm    Heart Rate (Exercise)   96 bpm    Heart Rate (Exit)  82 bpm    Rating of Perceived Exertion (Exercise)  11    Symptoms  none    Duration  Progress to 30 minutes of  aerobic without signs/symptoms of physical distress    Intensity  THRR unchanged      Progression   Progression  Continue to progress workloads to maintain intensity without signs/symptoms of physical distress.    Average METs  2.5      Resistance Training   Training Prescription  Yes    Weight  3lbs    Reps  10-15    Time  10 Minutes      Interval Training   Interval Training  No      Recumbant Bike   Level  2.5    Minutes  10    METs  2.3      NuStep   Level  4    SPM  85    Minutes  10    METs  2.5  Track   Laps  9    Minutes  10    METs  2.57      Home Exercise Plan   Plans to continue exercise at  Home (comment)    Frequency  Add 1 additional day to program exercise sessions.    Initial Home Exercises Provided  11/04/17       Functional Capacity: 6 Minute Walk    Row Name 10/10/17 1212 01/03/18 1518       6 Minute Walk   Phase  Initial  Discharge    Distance  1200 feet  1463 feet    Distance % Change  -  21.92 %    Walk Time  6 minutes  6 minutes    # of Rest Breaks  0  0    MPH  2.3  2.77    METS  2  3.02    RPE  13  12    VO2 Peak  7.1  -    Symptoms  Yes (comment)  No    Comments  leg fatigue  -    Resting HR  53 bpm  68 bpm    Resting BP  112/52  128/72    Resting Oxygen Saturation   100 %  -    Exercise Oxygen Saturation  during 6 min walk  98 %  -    Max Ex. HR  66 bpm  84 bpm    Max Ex. BP  122/60  164/82    2 Minute Post BP  110/60  122/68       Psychological, QOL, Others - Outcomes: PHQ 2/9: Depression screen [facility] 2/9 01/17/2018 01/10/2018 12/03/2017 10/14/2017 07/11/2017  Decreased Interest 0 0 0 0 0  Down, Depressed, Hopeless 0 0 0 0 0  PHQ - 2 Score 0 0 0 0 0    Quality of Life: Quality of Life - 01/17/18 1527      Quality of Life   Select  Quality of Life      Quality of Life Scores    Health/Function Pre  24.19 %    Health/Function Post  27.58 %    Health/Function % Change  14.01 %    Socioeconomic Pre  27.86 %    Socioeconomic Post  27.33 %    Socioeconomic % Change   -1.9 %    Psych/Spiritual Pre  27.86 %    Psych/Spiritual Post  30 %    Psych/Spiritual % Change  7.68 %    Family Pre  30 %    Family Post  30 %    Family % Change  0 %    GLOBAL Pre  26.7 %    GLOBAL Post  28.42 %    GLOBAL % Change  6.44 %       Personal Goals: Goals established at orientation with interventions provided to work toward goal. Personal Goals and Risk Factors at Admission - 11/14/17 1435      Core Components/Risk Factors/Patient Goals on Admission   Expected Outcomes  Improve functional capacity of life;Short term: Attendance in program 2-3 days a week with increased exercise capacity. Reported lower sodium intake. Reported increased fruit and vegetable intake. Reports medication compliance.;Short term: Daily weights obtained and reported for increase. Utilizing diuretic protocols set by physician.;Long term: Adoption of self-care skills and reduction of barriers for early signs and symptoms recognition and intervention leading to self-care maintenance.        Personal Goals Discharge:  Goals and Risk Factor Review    Row Name 11/14/17 1425 12/12/17 1555 01/09/18 1539 01/17/18 1524       Core Components/Risk Factors/Patient Goals Review   Personal Goals Review  Heart Failure;Hypertension  Heart Failure;Hypertension  Heart Failure;Hypertension  Heart Failure;Hypertension    Review  [name]'s vital signs have been stable at cardiac rehab. Ms [name] reports feeling stronger since she has been partcipating in phase 2 cardiac rehab  [name]'s vital signs have been stable at cardiac rehab. Ms [name] reports feeling stronger since she has been partcipating in phase 2 cardiac rehab  [name]'s vital signs have been stable at cardiac rehab. Ms [name] reports feeling stronger since she has  been partcipating in phase 2 cardiac rehab  [name]'s vital signs have been stable at cardiac rehab. Ms [name] reports feeling stronger since she has been partcipating in phase 2 cardiac rehab    Expected [place] will continue to partcipate in phase 2 cardiac rehab and take her medications as prescribed.  Rilea will continue to partcipate in phase 2 cardiac rehab and take her medications as prescribed.  Ahriana will continue to partcipate in phase 2 cardiac rehab and take her medications as prescribed.  Ellora will continue to follow diet and lifestyle modifications and go to the [facility] for exercise upon discharge from cardiac rehab       Exercise Goals and Review: Exercise Goals    Row Name 10/10/17 0934             Exercise Goals   Increase Physical Activity  Yes       Intervention  Provide advice, education, support and counseling about physical activity/exercise needs.;Develop an individualized exercise prescription for aerobic and resistive training based on initial evaluation findings, risk stratification, comorbidities and participant's personal goals.       Expected Outcomes  Short Term: Attend rehab on a regular basis to increase amount of physical activity.;Long Term: Add in home exercise to make exercise part of routine and to increase amount of physical activity.;Long Term: Exercising regularly at least 3-5 days a week.       Increase Strength and Stamina  Yes get back to gardening, household chores, cooking and baking       Intervention  Provide advice, education, support and counseling about physical activity/exercise needs.;Develop an individualized exercise prescription for aerobic and resistive training based on initial evaluation findings, risk stratification, comorbidities and participant's personal goals.       Expected Outcomes  Short Term: Increase workloads from initial exercise prescription for resistance, speed, and METs.;Short Term: Perform resistance training exercises  routinely during rehab and add in resistance training at home;Long Term: Improve cardiorespiratory fitness, muscular endurance and strength as measured by increased METs and functional capacity (6MWT)       Able to understand and use rate of perceived exertion (RPE) scale  Yes       Intervention  Provide education and explanation on how to use RPE scale       Expected Outcomes  Short Term: Able to use RPE daily in rehab to express subjective intensity level;Long Term:  Able to use RPE to guide intensity level when exercising independently       Knowledge and understanding of Target Heart Rate Range (THRR)  Yes       Intervention  Provide education and explanation of THRR including how the numbers were predicted and where they are located for reference  Expected Outcomes  Short Term: Able to state/look up THRR;Long Term: Able to use THRR to govern intensity when exercising independently;Short Term: Able to use daily as guideline for intensity in rehab       Able to check pulse independently  Yes       Intervention  Provide education and demonstration on how to check pulse in carotid and radial arteries.;Review the importance of being able to check your own pulse for safety during independent exercise       Expected Outcomes  Short Term: Able to explain why pulse checking is important during independent exercise;Long Term: Able to check pulse independently and accurately       Understanding of Exercise Prescription  Yes       Intervention  Provide education, explanation, and written materials on patient's individual exercise prescription       Expected Outcomes  Short Term: Able to explain program exercise prescription;Long Term: Able to explain home exercise prescription to exercise independently          Nutrition & Weight - Outcomes: Pre Biometrics - 10/10/17 1220      Pre Biometrics   Height  '5\' 8"'$  (1.727 m)    Weight  158 lb 15.2 oz (72.1 kg)    Waist Circumference  31.5 inches    Hip  Circumference  39.5 inches    Waist to Hip Ratio  0.8 %    BMI (Calculated)  24.17    Triceps Skinfold  24 mm    % Body Fat  35.5 %    Grip Strength  25 kg    Flexibility  0 in    Single Leg Stand  4.97 seconds      Post Biometrics - 01/17/18 1455       Post  Biometrics   Height  '5\' 8"'$  (1.727 m)    Weight  146 lb 13.2 oz (66.6 kg)    Waist Circumference  31.25 inches    Hip Circumference  38.75 inches    Waist to Hip Ratio  0.81 %    BMI (Calculated)  22.33    Triceps Skinfold  16 mm    % Body Fat  32.4 %    Grip Strength  25.5 kg    Flexibility  0 in    Single Leg Stand  9.09 seconds       Nutrition: Nutrition Therapy & Goals - 10/10/17 1222      Nutrition Therapy   Diet  General, Healthful      Personal Nutrition Goals   Nutrition Goal  Pt to liberalize her diet until her appetite improves.     Personal Goal #2  Pt to identify food quantities necessary to achieve weight maintenance of ~160 lb at graduation from cardiac rehab.       Intervention Plan   Intervention  Prescribe, educate and counsel regarding individualized specific dietary modifications aiming towards targeted core components such as weight, hypertension, lipid management, diabetes, heart failure and other comorbidities.    Expected Outcomes  Short Term Goal: Understand basic principles of dietary content, such as calories, fat, sodium, cholesterol and nutrients.;Long Term Goal: Adherence to prescribed nutrition plan.       Nutrition Discharge: Nutrition Assessments - 02/14/18 0838      MEDFICTS Scores   Pre Score  21    Post Score  30    Score Difference  9       Education Questionnaire Score: Knowledge Questionnaire Score - 01/17/18 1527  Knowledge Questionnaire Score   Pre Score  17/24    Post Score  22/24       Goals reviewed with patient; copy given to patient. Rachna graduated from cardiac rehab program today with completion of 36 exercise sessions in Phase II. Pt maintained good  attendance and progressed nicely during her participation in rehab as evidenced by increased MET level.   Medication list reconciled. Repeat  PHQ score-0  .  Pt has made significant lifestyle changes and should be commended for her success. Pt feels she has achieved her goals during cardiac rehab.   [facility] [place]on increased her distance on her post exercise walk test and lost weight. Kynslee's primary care physician was notified about her weight loss. We are proud of Mrs. Gurbani progress.[name], RN,BSN 02/14/2018 9:44 AM

## 2018-01-17 NOTE — Telephone Encounter (Signed)
Copied from [name] [id number]. Topic: Quick Communication - Lab Results >> Jan 17, 2018 12:23 PM Hewitt Shorts wrote: Pt is needing to talk with Dr. Brigitte Pulse in regards to her most recent lab results   Best number [phone]

## 2018-01-27 ENCOUNTER — Inpatient Hospital Stay (HOSPITAL_COMMUNITY): Admission: RE | Admit: 2018-01-27 | Payer: Medicare Other | Source: Ambulatory Visit

## 2018-01-31 NOTE — Telephone Encounter (Signed)
Pt calling again requesting call from Dr. Brigitte Pulse or nurse to discuss most recent lab results relative to her kidneys. Please call at home # [phone].

## 2018-02-04 ENCOUNTER — Telehealth (HOSPITAL_COMMUNITY): Payer: Self-pay | Admitting: Pharmacist

## 2018-02-04 NOTE — Telephone Encounter (Signed)
[name] called asking if it was safe for her to take Vitamin C and B12. I have advised her that in recommended dosages, these should both be safe for her to take.   Ruta Hinds. Velva Harman, PharmD, BCPS, CPP Clinical Pharmacist Phone: [phone] 02/04/2018 12:01 PM

## 2018-02-07 ENCOUNTER — Other Ambulatory Visit (HOSPITAL_COMMUNITY): Payer: Self-pay

## 2018-02-07 MED ORDER — LOSARTAN POTASSIUM 25 MG PO TABS: 25.0000 mg | ORAL_TABLET | Freq: Two times a day (BID) | ORAL | 6 refills | Status: DC

## 2018-02-08 NOTE — Telephone Encounter (Signed)
Message from pt - lab results sent to Dr. Nolon Rod

## 2018-02-08 NOTE — Telephone Encounter (Signed)
The patient has a referral that was sent to [facility]. Please let her know that her kidney function is abnormal and remains high.   [facility] [address]  [place], [place] [address] Phone: [phone]

## 2018-02-11 NOTE — Addendum Note (Signed)
Encounter addended by: Sol Passer on: 02/11/2018 1:44 PM  Actions taken: Flowsheet data copied forward, Visit Navigator Flowsheet section accepted, Vitals modified

## 2018-02-12 NOTE — Telephone Encounter (Signed)
Left message on voicemail to return office call. Dgaddy, CMA 

## 2018-02-13 ENCOUNTER — Telehealth: Payer: Self-pay | Admitting: Family Medicine

## 2018-02-13 NOTE — Telephone Encounter (Signed)
Copied from [place] [id number]. Topic: Quick Communication - Other Results >> Feb 13, 2018 10:49 AM Leward Quan A wrote: Patient called to request a call back from Nurse she did not give details but has to do with a Kidney doctor. Ph. # [phone]

## 2018-02-14 ENCOUNTER — Ambulatory Visit
Admission: RE | Admit: 2018-02-14 | Discharge: 2018-02-14 | Disposition: A | Discharge status: Home / Self Care | Payer: Medicare Other | Source: Ambulatory Visit | Attending: Oncology | Admitting: Oncology

## 2018-02-14 DIAGNOSIS — N189 Chronic kidney disease, unspecified: Secondary | ICD-10-CM

## 2018-02-14 DIAGNOSIS — Z170 Estrogen receptor positive status [ER+]: Principal | ICD-10-CM

## 2018-02-14 DIAGNOSIS — N184 Chronic kidney disease, stage 4 (severe): Secondary | ICD-10-CM

## 2018-02-14 DIAGNOSIS — D631 Anemia in chronic kidney disease: Secondary | ICD-10-CM

## 2018-02-14 DIAGNOSIS — Z853 Personal history of malignant neoplasm of breast: Secondary | ICD-10-CM | POA: Diagnosis not present

## 2018-02-14 DIAGNOSIS — I482 Chronic atrial fibrillation, unspecified: Secondary | ICD-10-CM

## 2018-02-14 DIAGNOSIS — R928 Other abnormal and inconclusive findings on diagnostic imaging of breast: Secondary | ICD-10-CM | POA: Diagnosis not present

## 2018-02-14 DIAGNOSIS — C50512 Malignant neoplasm of lower-outer quadrant of left female breast: Secondary | ICD-10-CM

## 2018-02-14 NOTE — Telephone Encounter (Signed)
Pt returning your call.  If you can please call before 1 pm on Monday, that would be good.

## 2018-02-14 NOTE — Addendum Note (Signed)
Encounter addended by: Ivonne Andrew, RD on: 02/14/2018 8:39 AM  Actions taken: Flowsheet data copied forward, Visit Navigator Flowsheet section accepted

## 2018-02-14 NOTE — Telephone Encounter (Signed)
Left message on voicemail.

## 2018-02-28 ENCOUNTER — Other Ambulatory Visit (HOSPITAL_COMMUNITY): Payer: Self-pay | Admitting: Adult Health

## 2018-03-07 ENCOUNTER — Inpatient Hospital Stay (HOSPITAL_COMMUNITY): Admission: RE | Admit: 2018-03-07 | Payer: Medicare Other | Source: Ambulatory Visit

## 2018-03-10 ENCOUNTER — Telehealth (HOSPITAL_COMMUNITY): Payer: Self-pay | Admitting: *Deleted

## 2018-03-10 NOTE — Telephone Encounter (Signed)
Pt called asking if she should continue spironolactone she was reviewing her med list and needed to know. I told her she should remain on spironolactone. Pt agreeable.

## 2018-03-22 ENCOUNTER — Other Ambulatory Visit (HOSPITAL_COMMUNITY): Payer: Self-pay | Admitting: Adult Health

## 2018-04-07 ENCOUNTER — Telehealth (HOSPITAL_COMMUNITY): Payer: Self-pay | Admitting: Cardiology

## 2018-04-07 NOTE — Telephone Encounter (Signed)
Pt called to report SOB, reports her weight is up some for her, weight today is 144. Reports she did take a lasix today as instructed. Denied dizziness, CP, or cough, reports she feels better when she is lying down and resting.advised patient to continue resting this evening, advised to be sure to continue her routine twice weekly dose of lasix tomorrow. Return call 04/08/18 for update. If SOB worsens or she begins to have CP report to ER for further evaluation  Patient reports she cannot afford eliquis, did pay $120 this month but will not be able to pay in the months to come. Message sent to CHF pharmacist

## 2018-04-11 ENCOUNTER — Encounter (HOSPITAL_COMMUNITY): Payer: Medicare Other

## 2018-05-15 ENCOUNTER — Other Ambulatory Visit (HOSPITAL_COMMUNITY): Payer: Self-pay

## 2018-05-15 ENCOUNTER — Encounter: Payer: Self-pay | Admitting: Family Medicine

## 2018-05-15 ENCOUNTER — Other Ambulatory Visit: Payer: Self-pay

## 2018-05-15 ENCOUNTER — Ambulatory Visit (HOSPITAL_COMMUNITY): Payer: Medicare Other | Admitting: Nurse Practitioner

## 2018-05-15 ENCOUNTER — Ambulatory Visit: Payer: Medicare Other | Admitting: Family Medicine

## 2018-05-15 ENCOUNTER — Ambulatory Visit (INDEPENDENT_AMBULATORY_CARE_PROVIDER_SITE_OTHER): Payer: Medicare Other

## 2018-05-15 VITALS — BP 111/69 | HR 120 | Temp 98.0°F | Resp 16 | Ht 68.5 in | Wt 142.0 lb

## 2018-05-15 DIAGNOSIS — R000 Tachycardia, unspecified: Secondary | ICD-10-CM

## 2018-05-15 DIAGNOSIS — I499 Cardiac arrhythmia, unspecified: Secondary | ICD-10-CM

## 2018-05-15 DIAGNOSIS — N189 Chronic kidney disease, unspecified: Secondary | ICD-10-CM

## 2018-05-15 DIAGNOSIS — E210 Primary hyperparathyroidism: Secondary | ICD-10-CM | POA: Diagnosis not present

## 2018-05-15 DIAGNOSIS — D631 Anemia in chronic kidney disease: Secondary | ICD-10-CM

## 2018-05-15 DIAGNOSIS — B370 Candidal stomatitis: Secondary | ICD-10-CM

## 2018-05-15 DIAGNOSIS — R634 Abnormal weight loss: Secondary | ICD-10-CM

## 2018-05-15 DIAGNOSIS — R7303 Prediabetes: Secondary | ICD-10-CM | POA: Diagnosis not present

## 2018-05-15 DIAGNOSIS — R3121 Asymptomatic microscopic hematuria: Secondary | ICD-10-CM | POA: Diagnosis not present

## 2018-05-15 DIAGNOSIS — L659 Nonscarring hair loss, unspecified: Secondary | ICD-10-CM

## 2018-05-15 DIAGNOSIS — N184 Chronic kidney disease, stage 4 (severe): Secondary | ICD-10-CM

## 2018-05-15 DIAGNOSIS — J90 Pleural effusion, not elsewhere classified: Secondary | ICD-10-CM | POA: Diagnosis not present

## 2018-05-15 DIAGNOSIS — R0989 Other specified symptoms and signs involving the circulatory and respiratory systems: Secondary | ICD-10-CM | POA: Diagnosis not present

## 2018-05-15 DIAGNOSIS — I480 Paroxysmal atrial fibrillation: Secondary | ICD-10-CM

## 2018-05-15 LAB — POC MICROSCOPIC URINALYSIS (UMFC): Mucus: ABSENT

## 2018-05-15 LAB — POCT URINALYSIS DIP (MANUAL ENTRY)
BILIRUBIN UA: NEGATIVE
BILIRUBIN UA: NEGATIVE mg/dL
Glucose, UA: NEGATIVE mg/dL
NITRITE UA: NEGATIVE
PROTEIN UA: NEGATIVE mg/dL
Spec Grav, UA: 1.02 (ref 1.010–1.025)
UROBILINOGEN UA: 0.2 U/dL
pH, UA: 6.5 (ref 5.0–8.0)

## 2018-05-15 LAB — POCT CBC
Granulocyte percent: 77.7 %G (ref 37–80)
HCT, POC: 28.4 % — AB (ref 29–41)
HEMOGLOBIN: 9.8 g/dL (ref 9.5–13.5)
LYMPH, POC: 1 (ref 0.6–3.4)
MCH, POC: 30.9 pg (ref 27–31.2)
MCHC: 34.5 g/dL (ref 31.8–35.4)
MCV: 89.7 fL (ref 76–111)
MID (CBC): 0.2 (ref 0–0.9)
MPV: 8.5 fL (ref 0–99.8)
POC Granulocyte: 4 (ref 2–6.9)
POC LYMPH PERCENT: 18.8 %L (ref 10–50)
POC MID %: 3.5 % (ref 0–12)
Platelet Count, POC: 214 10*3/uL (ref 142–424)
RBC: 3.17 M/uL — AB (ref 4.04–5.48)
RDW, POC: 13.8 %
WBC: 5.1 10*3/uL (ref 4.6–10.2)

## 2018-05-15 LAB — POCT SKIN KOH: SKIN KOH, POC: NEGATIVE

## 2018-05-15 MED ORDER — CLOTRIMAZOLE 10 MG MT TROC: 10.0000 mg | Freq: Every day | OROMUCOSAL | 0 refills | Status: DC

## 2018-05-15 NOTE — Patient Instructions (Addendum)
DECREASE YOUR CALCIUM SUPPLEMENT TO JUST ONCE A DAY - SO ESSENTIALLY CUT IN HALF WHATEVER YOU ARE EATING.    I recommend starting a daily iron supplement like ferrous sulfate 324mg  (65mg  of elemental iron) which has the highest iron dose of the products commonly available over-the-counter.  It is best absorbed on an empty stomach 30 minutes before eating or 2 hours after a meal with a vitamin C supplement or a small glass of orange juice. This can cause significant constipation in some patients, in which case you may need to switch to a slow release, lower dose supplement like MegaFood Blood Builder (26mg  of elemental iron), Nu-Iron (150mg  of elemental iron), or Slow-Fe (45mg  of elemental iron). Avoid taking antacids, dairy products, tea, or coffee within 2 hours before or after this supplement because they will decrease the iron's absorption.  If you have lab work done today you will be contacted with your lab results within the next 2 weeks.  If you have not heard from [place] then please contact us. The fastest way to get your results is to register for My Chart.   IF you received an x-ray today, you will receive an invoice from [facility]. Please contact [facility] at [phone] with questions or concerns regarding your invoice.   IF you received labwork today, you will receive an invoice from [facility]. Please contact [facility] at [phone] with questions or concerns regarding your invoice.   Our billing staff will not be able to assist you with questions regarding bills from these companies.  You will be contacted with the lab results as soon as they are available. The fastest way to get your results is to activate your My Chart account. Instructions are located on the last page of this paperwork. If you have not heard from [place] regarding the results in 2 weeks, please contact this office.      Food Basics for Chronic Kidney Disease When your kidneys are not working well,  they cannot remove waste and excess substances from your blood as effectively as they did before. This can lead to a buildup and imbalance of these substances, which can worsen kidney damage and affect how your body functions. Certain foods lead to a buildup of these substances in the body. By changing your diet as recommended by your diet and nutrition specialist (dietitian) or health care provider, you could help prevent further kidney damage and delay or prevent the need for dialysis. What are tips for following this plan? General instructions  Work with your health care provider and dietitian to develop a meal plan that is right for you. Foods you can eat, limit, or avoid will be different for each person depending on the stage of kidney disease and any other existing health conditions.  Talk with your health care provider about whether you should take a vitamin and mineral supplement.  Use standard measuring cups and spoons to measure servings of foods. Use a kitchen scale to measure portions of protein foods.  If directed by your health care provider, avoid drinking too much fluid. Measure and count all liquids, including water, ice, soups, flavored gelatin, and frozen desserts such as popsicles or ice cream. Reading food labels  Check the amount of sodium in foods. Choose foods that have less than 300 milligrams (mg) per serving.  Check the ingredient list for phosphorus or potassium-based additives or preservatives.  Check the amount of saturated and trans fat. Limit or avoid these fats as told by your  dietitian. Shopping  Avoid buying foods that are: ? Processed, frozen, or prepackaged. ? Calcium-enriched or fortified.  Do not buy foods that have salt or sodium listed among the first five ingredients.  Do not buy canned vegetables. Cooking  Replace animal proteins, such as meat, fish, eggs, or dairy, with plant proteins from beans, nuts, and soy. ? Use soy milk instead of cow's  milk. ? Add beans or tofu to soups, casseroles, or pasta dishes instead of meat.  Soak vegetables, such as potatoes, before cooking to reduce potassium. To do this: ? Peel and cut into small pieces. ? Soak in warm water for at least 2 hours. For every 1 cup of vegetables, use 10 cups of water. ? Drain and rinse with warm water. ? Boil for at least 5 minutes. Meal planning  Limit the amount of protein from plant and animal sources you eat each day.  Do not add salt to food when cooking or before eating.  Eat meals and snacks at around the same time each day. If you have diabetes:  If you have diabetes (diabetes mellitus) and chronic kidney disease, it is important to keep your blood glucose in the target range recommended by your health care provider. Follow your diabetes management plan. This may include: ? Checking your blood glucose regularly. ? Taking oral medicines, insulin, or both. ? Exercising for at least 30 minutes on 5 or more days each week, or as told by your health care provider. ? Tracking how many servings of carbohydrates you eat at each meal.  You may be given specific guidelines on how much of certain foods and nutrients you may eat, depending on your stage of kidney disease and whether you have high blood pressure (hypertension). Follow your meal plan as told by your dietitian. What nutrients should be limited? The items listed are not a complete list. Talk with your dietitian about what dietary choices are best for you. Potassium Potassium affects how steadily your heart beats. If too much potassium builds up in your blood, it can cause an irregular heartbeat or even a heart attack. You may need to eat less potassium, depending on your blood potassium levels and the stage of kidney disease. Talk to your dietitian about how much potassium you may have each day. You may need to limit or avoid foods that are high in potassium, such as:  Milk and soy milk.  Fruits,  such as bananas, papaya, apricots, nectarines, melon, prunes, raisins, kiwi, and oranges.  Vegetables, such as potatoes, sweet potatoes, yams, tomatoes, leafy greens, beets, okra, avocado, pumpkin, and winter squash.  White and lima beans.  Phosphorus Phosphorus is a mineral found in your bones. A balance between calcium and phosphorous is needed to build and maintain healthy bones. Too much phosphorus pulls calcium from your bones. This can make your bones weak and more likely to break. Too much phosphorus can also make your skin itch. You may need to eat less phosphorus depending on your blood phosphorus levels and the stage of kidney disease. Talk to your dietitian about how much potassium you may have each day. You may need to take medicine to lower your blood phosphorus levels if diet changes do not help. You may need to limit or avoid foods that are high in phosphorus, such as:  Milk and dairy products.  Dried beans and peas.  Tofu, soy milk, and other soy-based meat replacements.  Colas.  Nuts and peanut butter.  Meat, poultry, and fish.  Bran cereals and oatmeals.  Protein Protein helps you to make and keep muscle. It also helps in the repair of your body's cells and tissues. One of the natural breakdown products of protein is a waste product called urea. When your kidneys are not working properly, they cannot remove wastes, such as urea, like they did before you developed chronic kidney disease. Reducing how much protein you eat can help prevent a buildup of urea in your blood. Depending on your stage of kidney disease, you may need to limit foods that are high in protein. Sources of animal protein include:  Meat (all types).  Fish and seafood.  Poultry.  Eggs.  Dairy.  Other protein foods include:  Beans and legumes.  Nuts and nut butter.  Soy and tofu.  Sodium Sodium, which is found in salt, helps maintain a healthy balance of fluids in your body. Too much  sodium can increase your blood pressure and have a negative effect on the function of your heart and lungs. Too much sodium can also cause your body to retain too much fluid, making your kidneys work harder. Most people should have less than 2,300 milligrams (mg) of sodium each day. If you have hypertension, you may need to limit your sodium to 1,500 mg each day. Talk to your dietitian about how much sodium you may have each day. You may need to limit or avoid foods that are high in sodium, such as:  Salt seasonings.  Soy sauce.  Cured and processed meats.  Salted crackers and snack foods.  Fast food.  Canned soups and most canned foods.  Pickled foods.  Vegetable juice.  Boxed mixes or ready-to-eat boxed meals and side dishes.  Bottled dressings, sauces, and marinades.  Summary  Chronic kidney disease can lead to a buildup and imbalance of waste and excess substances in the body. Certain foods lead to a buildup of these substances. By adjusting your intake of these foods, you could help prevent more kidney damage and delay or prevent the need for dialysis.  Food adjustments are different for each person with chronic kidney disease. Work with a dietitian to set up nutrient goals and a meal plan that is right for you.  If you have diabetes and chronic kidney disease, it is important to keep your blood glucose in the target range recommended by your health care provider. This information is not intended to replace advice given to you by your health care provider. Make sure you discuss any questions you have with your health care provider. Document Released: 08/11/2002 Document Revised: 05/16/2016 Document Reviewed: 05/16/2016 Elsevier Interactive Patient Education  2018 [employer].  Chronic Kidney Disease, Adult Chronic kidney disease (CKD) occurs when the kidneys become damaged slowly over a long period of time. The kidneys are a pair of organs that do many important jobs in the  body, including:  Removing waste and extra fluid from the blood to make urine.  Making hormones that maintain the amount of fluid in tissues and blood vessels.  Maintaining the right amount of fluids and chemicals in the body.  A small amount of kidney damage may not cause problems, but a large amount of damage may make it hard or impossible for the kidneys to work the way they should. If steps are not taken to slow down kidney damage or to stop it from getting worse, the kidneys may stop working permanently (end-stage renal disease or ESRD). Most of the time, CKD does not go away, but  it can often be controlled. People who have CKD are usually able to live normal lives. What are the causes? The most common causes of this condition are diabetes and high blood pressure (hypertension). Other causes include:  Heart and blood vessel (cardiovascular) disease.  Kidney diseases, such as: ? Glomerulonephritis. ? Interstitial nephritis. ? Polycystic kidney disease. ? Renal vascular disease.  Diseases that affect the immune system.  Genetic diseases.  Medicines that damage the kidneys, such as anti-inflammatory medicines.  Being around or being in contact with poisonous (toxic) substances.  A kidney or urinary infection that occurs again and again (recurs).  Vasculitis. This is swelling or inflammation of the blood vessels.  A problem with urine flow that may be caused by: ? Cancer. ? Having kidney stones more than one time. ? An enlarged prostate, in males.  What increases the risk? You are more likely to develop this condition if you:  Are older than age 21.  Are female.  Are African-American, Hispanic, Asian, [place], or American [place].  Are a current or former smoker.  Are obese.  Have a family history of kidney disease or failure.  Often take medicines that are damaging to the kidneys.  What are the signs or symptoms? Symptoms of this condition  include:  Swelling (edema) of the face, legs, ankles, or feet.  Tiredness (lethargy) and having less energy.  Nausea or vomiting.  Confusion or trouble concentrating.  Problems with urination, such as: ? Painful or burning feeling during urination. ? Decreased urine production. ? Frequent urination, especially at night. ? Bloody urine.  Muscle twitches and cramps, especially in the legs.  Shortness of breath.  Weakness.  Loss of appetite.  Metallic taste in the mouth.  Trouble sleeping.  Dry, itchy skin.  A low blood count (anemia).  Pale lining of the eyelids and surface of the eye (conjunctiva).  Symptoms develop slowly and may not be obvious until the kidney damage becomes severe. It is possible to have kidney disease for years without having any symptoms. How is this diagnosed? This condition may be diagnosed based on:  Blood tests.  Urine tests.  Imaging tests, such as an ultrasound or CT scan.  A test in which a sample of tissue is removed from the kidneys to be examined under a microscope (kidney biopsy).  These test results will help your health care provider determine how serious the CKD is. How is this treated? There is no cure for most cases of this condition, but treatment usually relieves symptoms and prevents or slows the progression of the disease. Treatment may include:  Making diet changes, which may require you to avoid alcohol, salty foods (sodium), and foods that are high in potassium, calcium, and protein.  Medicines: ? To lower blood pressure. ? To control blood glucose. ? To relieve anemia. ? To relieve swelling. ? To protect your bones. ? To improve the balance of electrolytes in your blood.  Removing toxic waste from the body through types of dialysis, if the kidneys can no longer do their job (kidney failure).  Managing any other conditions that are causing your CKD or making it worse.  Follow these instructions at  home: Medicines  Take over-the-counter and prescription medicines only as told by your health care provider. The dose of some medicines that you take may need to be adjusted.  Do not take any new medicines unless approved by your health care provider. Many medicines can worsen your kidney damage.  Do not  take any vitamin and mineral supplements unless approved by your health care provider. Many nutritional supplements can worsen your kidney damage. General instructions  Follow your prescribed diet as told by your health care provider.  Do not use any products that contain nicotine or tobacco, such as cigarettes and e-cigarettes. If you need help quitting, ask your health care provider.  Monitor and track your blood pressure at home. Report changes in your blood pressure as told by your health care provider.  If you are being treated for diabetes, monitor and track your blood sugar (blood glucose) levels as told by your health care provider.  Maintain a healthy weight. If you need help with this, ask your health care provider.  Start or continue an exercise plan. Exercise at least 30 minutes a day, 5 days a week.  Keep your immunizations up to date as told by your health care provider.  Keep all follow-up visits as told by your health care provider. This is important. Where to find more information:  American Association of Kidney Patients: BombTimer.gl  National Kidney Foundation: www.kidney.[place]: https://mathis.com/  [facility]: www.lifeoptions.org and www.kidneyschool.org Contact a health care provider if:  Your symptoms get worse.  You develop new symptoms. Get help right away if:  You develop symptoms of ESRD, which include: ? Headaches. ? Numbness in the hands or feet. ? Easy bruising. ? Frequent hiccups. ? Chest pain. ? Shortness of breath. ? Lack of menstruation, in women.  You have a fever.  You have decreased urine  production.  You have pain or bleeding when you urinate. Summary  Chronic kidney disease (CKD) occurs when the kidneys become damaged slowly over a long period of time.  The most common causes of this condition are diabetes and high blood pressure (hypertension).  There is no cure for most cases of this condition, but treatment usually relieves symptoms and prevents or slows the progression of the disease. Treatment may include a combination of medicines and lifestyle changes. This information is not intended to replace advice given to you by your health care provider. Make sure you discuss any questions you have with your health care provider. Document Released: 02/28/2008 Document Revised: 06/28/2016 Document Reviewed: 06/28/2016 Elsevier Interactive Patient Education  [employer].

## 2018-05-15 NOTE — Progress Notes (Signed)
Subjective:    Patient: [name]  DOB: 06-18-1934; 83 y.o.   MRN: [id number]  Chief Complaint  Patient presents with  . Hypertension    follow-up   . Hyperlipidemia    HPI After we did the anti-fungal the taste of food get better but now she can taste sugar - she doesn't like bread, candies, and cookies as taste to sweet.  Everything doesn't have the flavor that it should.  She weighs self at home every day and yesterday was 144, today 143 - depends on what eat at night. Has not dropped below 141 in over 2 mos - has been stead and stable.  THinks appetite is improved. Doesn't like the taste of meat right now - just doesn't taste good to her.  When she gets hungry and tired she gets mid epigastric pain across the middle of her stomach - feel slike stomach is dropping down  No bone pain, no abd pain, mood good.    She is taking calcium supp bid  Appointment with [facility] in Jan 6.   Doesn't eat as much salad as dressing taste to salty, meat seems to dry.  No probl w bowel - no constipation  Never used nitro.  Did get a new rx just so she has it avail. Not on the citalopram -started during hosp when she was anxious but not needed since released  Only using furesomide about once a week.  Hair loss  Very little phlegm cough in the morningds.   Medical History Past Medical History:  Diagnosis Date  . Anxiety   . Breast cancer of lower-outer quadrant of left female breast ([place]) 10/11/2014   treated with lumpectomy and radiation  . Coronary artery disease 07/27/2017  . GERD (gastroesophageal reflux disease)   . Hypertension   . Myocardial infarction ([place]) 07/27/2017   STEMI of anterolateral wall. Cath w/ stent and cabg by Dr. Martinique  . Osteoporosis 10/20/2014  . Personal history of radiation therapy 2016   treatment of breast cancer  . PONV (postoperative nausea and vomiting)   . PUD (peptic ulcer disease)   . Pure hypercholesterolemia 04/08/2014  . Stone, kidney    Past  Surgical History:  Procedure Laterality Date  . BREAST LUMPECTOMY Left 2016  . BREAST LUMPECTOMY WITH SENTINEL LYMPH NODE BIOPSY Left 10/22/2014   Procedure: BREAST LUMPECTOMY WITH SENTINEL LYMPH NODE BIOPSY;  Surgeon: Autumn Messing III, MD;  Location: [facility];  Service: General;  Laterality: Left;  . CORONARY STENT INTERVENTION N/A 07/27/2017   Procedure: CORONARY STENT INTERVENTION;  Surgeon: Martinique, Peter M, MD;  Location: [facility];  Service: Cardiovascular;  Laterality: N/A;  . CORONARY/GRAFT ACUTE MI REVASCULARIZATION N/A 07/27/2017   Procedure: Coronary/Graft Acute MI Revascularization;  Surgeon: Martinique, Peter M, MD;  Location: [facility];  Service: Cardiovascular;  Laterality: N/A;  . LEFT HEART CATH AND CORONARY ANGIOGRAPHY N/A 07/27/2017   Procedure: LEFT HEART CATH AND CORONARY ANGIOGRAPHY;  Surgeon: Martinique, Peter M, MD;  Location: [facility];  Service: Cardiovascular;  Laterality: N/A;  . RE-EXCISION OF BREAST CANCER,SUPERIOR MARGINS Left 11/04/2014   Procedure: RE-EXCISION OF LEFT BREAST INFERIOR MARGINS;  Surgeon: Autumn Messing III, MD;  Location: [facility];  Service: General;  Laterality: Left;   Current Outpatient Medications on File Prior to Visit  Medication Sig Dispense Refill  . anastrozole (ARIMIDEX) 1 MG tablet TAKE ONE TABLET BY MOUTH ONCE DAILY 90 tablet 4  . atorvastatin (LIPITOR) 80  MG tablet TAKE 1 TABLET BY MOUTH ONCE DAILY AT  6  PM 90 tablet 2  . Calcium Carb-Cholecalciferol (CALCIUM + D3) 600-800 MG-UNIT TABS Take 1 tablet by mouth daily.     . Cholecalciferol (VITAMIN D-3) 5000 units TABS Take by mouth.    . citalopram (CELEXA) 10 MG tablet Take 1 tablet (10 mg total) by mouth daily. 30 tablet 6  . clopidogrel (PLAVIX) 75 MG tablet Take 1 tablet (75 mg total) by mouth daily. 30 tablet 6  . clotrimazole (MYCELEX) 10 MG troche Take 1 tablet (10 mg total) by mouth 5 (five) times daily. Slowly dissolve in mouth, do not chew or swallow whole.  Use ALL lozenges 70 tablet 0  . co-enzyme Q-10 30 MG capsule Take 30 mg by mouth 3 (three) times daily.    Marland Kitchen ELIQUIS 2.5 MG TABS tablet TAKE 1 TABLET BY MOUTH TWICE DAILY 60 tablet 5  . fish oil-omega-3 fatty acids 1000 MG capsule Take 1 g by mouth daily.    Marland Kitchen losartan (COZAAR) 25 MG tablet Take 1 tablet (25 mg total) by mouth 2 (two) times daily. 60 tablet 6  . magnesium gluconate (MAGONATE) 500 MG tablet Take 500 mg by mouth 2 (two) times daily.    . Multiple Vitamin (MULTIVITAMIN) tablet Take 1 tablet by mouth daily.    . nitroGLYCERIN (NITROSTAT) 0.3 MG SL tablet Place 1 tablet (0.3 mg total) under the tongue every 5 (five) minutes as needed for chest pain. 90 tablet 12  . spironolactone (ALDACTONE) 25 MG tablet TAKE 1 TABLET BY MOUTH ONCE DAILY 30 tablet 5  . furosemide (LASIX) 20 MG tablet Take 1 tablet (20 mg total) by mouth as needed. 90 tablet 3   No current facility-administered medications on file prior to visit.    Allergies  Allergen Reactions  . Lisinopril Cough   Family History  Problem Relation Age of Onset  . Pancreatic cancer Mother        pancreastic  . Stroke Father   . Hypertension Brother   . Hypertension Sister   . Hypertension Brother   . Heart disease Brother   . Hypertension Sister    Social History   Socioeconomic History  . Marital status: Married    Spouse name: Not on file  . Number of children: Not on file  . Years of education: Not on file  . Highest education level: Not on file  Occupational History  . Occupation: retired  Scientific laboratory technician  . Financial resource strain: Not on file  . Food insecurity:    Worry: Not on file    Inability: Not on file  . Transportation needs:    Medical: Not on file    Non-medical: Not on file  Tobacco Use  . Smoking status: Never Smoker  . Smokeless tobacco: Never Used  Substance and Sexual Activity  . Alcohol use: No  . Drug use: No  . Sexual activity: Not on file  Lifestyle  . Physical activity:     Days per week: Not on file    Minutes per session: Not on file  . Stress: Not on file  Relationships  . Social connections:    Talks on phone: Not on file    Gets together: Not on file    Attends religious service: Not on file    Active member of club or organization: Not on file    Attends meetings of clubs or organizations: Not on file    Relationship status:  Not on file  Other Topics Concern  . Not on file  Social History Narrative  . Not on file   Depression screen [facility] 2/9 01/17/2018 01/10/2018 12/03/2017 10/14/2017 07/11/2017  Decreased Interest 0 0 0 0 0  Down, Depressed, Hopeless 0 0 0 0 0  PHQ - 2 Score 0 0 0 0 0    ROS As noted in HPI  Objective:  BP 111/69   Pulse (!) 120   Temp 98 F (36.7 C) (Oral)   Resp 16   Ht 5' 8.5" (1.74 m)   Wt 142 lb (64.4 kg)   SpO2 98%   BMI 21.27 kg/m  Physical Exam Constitutional:      General: She is not in acute distress.    Appearance: She is well-developed. She is not diaphoretic.  HENT:     Head: Normocephalic and atraumatic.     Right Ear: External ear normal.     Left Ear: External ear normal.     Mouth/Throat:     Mouth: Mucous membranes are pale.     Tongue: Lesions (white adherent plaque) present.  Eyes:     General: No scleral icterus.    Conjunctiva/sclera: Conjunctivae normal.  Neck:     Musculoskeletal: Normal range of motion and neck supple.     Thyroid: No thyromegaly.  Cardiovascular:     Rate and Rhythm: Tachycardia present. Rhythm irregularly irregular.     Heart sounds: Normal heart sounds.  Pulmonary:     Effort: Pulmonary effort is normal. No respiratory distress.     Breath sounds: Normal breath sounds.  Lymphadenopathy:     Cervical: No cervical adenopathy.  Skin:    General: Skin is warm and dry.     Findings: No erythema.  Neurological:     Mental Status: She is alert and oriented to person, place, and time.  Psychiatric:        Behavior: Behavior normal.     [facility] TESTING Office Visit on  05/15/2018  Component Date Value Ref Range Status  . Skin KOH, POC 05/15/2018 Negative  Negative Final  . WBC 05/15/2018 5.1  4.6 - 10.2 K/uL Final  . Lymph, poc 05/15/2018 1.0  0.6 - 3.4 Final  . POC LYMPH PERCENT 05/15/2018 18.8  10 - 50 %L Final  . MID (cbc) 05/15/2018 0.2  0 - 0.9 Final  . POC MID % 05/15/2018 3.5  0 - 12 %M Final  . POC Granulocyte 05/15/2018 4.0  2 - 6.9 Final  . Granulocyte percent 05/15/2018 77.7  37 - 80 %G Final  . RBC 05/15/2018 3.17* 4.04 - 5.48 M/uL Final  . Hemoglobin 05/15/2018 9.8  9.5 - 13.5 g/dL Final  . HCT, POC 05/15/2018 28.4* 29 - 41 % Final  . MCV 05/15/2018 89.7  76 - 111 fL Final  . MCH, POC 05/15/2018 30.9  27 - 31.2 pg Final  . MCHC 05/15/2018 34.5  31.8 - 35.4 g/dL Final  . RDW, POC 05/15/2018 13.8  % Final  . Platelet Count, POC 05/15/2018 214  142 - 424 K/uL Final  . MPV 05/15/2018 8.5  0 - 99.8 fL Final  . Color, UA 05/15/2018 yellow  yellow Final  . Clarity, UA 05/15/2018 clear  clear Final  . Glucose, UA 05/15/2018 negative  negative mg/dL Final  . Bilirubin, UA 05/15/2018 negative  negative Final  . Ketones, POC UA 05/15/2018 negative  negative mg/dL Final  . Spec Grav, UA 05/15/2018 1.020  1.010 - 1.025  Final  . Blood, UA 05/15/2018 large* negative Final  . pH, UA 05/15/2018 6.5  5.0 - 8.0 Final  . Protein Ur, POC 05/15/2018 negative  negative mg/dL Final  . Urobilinogen, UA 05/15/2018 0.2  0.2 or 1.0 E.U./dL Final  . Nitrite, UA 05/15/2018 Negative  Negative Final  . Leukocytes, UA 05/15/2018 Trace* Negative Final  . WBC,UR,HPF,POC 05/15/2018 None  None WBC/hpf Final  . RBC,UR,HPF,POC 05/15/2018 Too numerous to count * None RBC/hpf Final  . Bacteria 05/15/2018 Few* None, Too numerous to count Final  . Mucus 05/15/2018 Absent  Absent Final  . Epithelial Cells, UR Per Microscopy 05/15/2018 None  None, Too numerous to count cells/hpf Final    Dg Chest 2 View  Result Date: 05/15/2018 CLINICAL DATA:  Abnormal breath sounds.  Unintentional weight loss. Tachycardia. EXAM: CHEST - 2 VIEW COMPARISON:  No recent. FINDINGS: Cardiomegaly. No pulmonary venous congestion. Mild bilateral interstitial prominence. No focal infiltrate. Small right pleural effusion. No pneumothorax. Surgical clips noted over the abdomen. Calcific density noted over the liver. This could be within the liver parenchyma or calcified diaphragmatic plaques. IMPRESSION: 1.  Cardiomegaly.  No pulmonary venous congestion. 2. Mild bilateral interstitial prominence, most likely from chronic interstitial disease. No focal alveolar infiltrate. Small right pleural effusion. 3. Prominent calcific density noted over the right upper quadrant. This could represent hepatic calcification versus diaphragmatic calcified plaque. Electronically Signed   By: Marcello Moores  Register   On: 05/15/2018 11:46   EKG: done  Assessment & Plan:   1. Thrush, oral   2. Chronic kidney disease, stage IV (severe) ([place])   3. Hypercalcemia   4. Primary hyperparathyroidism ([place])   5. Anemia associated with chronic renal failure   6. Asymptomatic microscopic hematuria   7. Unintentional weight loss   8. Hair loss   9. Prediabetes   10. Tachycardia   11. Irregular cardiac rhythm   12. Abnormal lung sounds   13. Paroxysmal atrial fibrillation with rapid ventricular response (HCC)     Has a history of acute anterior STEMI in February 2019.  Afterward upon arrival to the ICU after the stents x2 were placed under PCI she suffered bradycardic arrest requiring epi and atropine and brief CPR and intubation.  Following echocardiogram showed LVEF 40 to 45% so she was placed on Lasix.  Her beta-blocker was stopped due to several episodes of bradycardia though then she later developed A. fib with RVR so she was loaded with amiodarone and started on Eliquis.  She chemically converted to normal sinus but had some breakthrough A. fib so the plan was to continue her on amiodarone with the ultimate dose of 200  mg twice a day as well as Eliquis 2.5 mg twice a day with reduced dose due to age and creatinine.  At that time her discharge weight was 167 pounds.  At that point she was started on carvedilol 3.125 twice daily for the coronary artery disease but continued on amiodarone 200 mg daily as remained in sinus rhythm.  When she saw cardiology in April she was only taking the Lasix twice a week and weight was stable around 160 pounds  She was last seen by Dr. Nani Ravens in the heart failure clinic On July 22.  At that point she was no longer on amiodaroneOr beta-blocker as the amiodarone had been stopped at the prior visit due to tremors and nausea.  He noted just to watch for recurrent AF however at that time she was still on Coreg 3.125  twice daily.  However, at this time the carvedilol was actually no longer on her medication list and had been discontinued By a CMA several months prior on 5/13 when patient had called into heart care reporting lightheadedness and shortness of breath and noted that her heart rate was in the 40s.  The telephone note states that patient was informed she may stop her carvedilol but to follow-up in the office visit with cardiology in 2 weeks but sooner if her symptoms do not resolve.  She was seen by A cardiology PA Mr. Chalmers Cater 2 weeks later.  However at that visit carvedilol was no longer on her medication list though in the assessment notes that she should continue on the carvedilol 3.125.  There was never any note that she did indeed stop this nor if her symptoms had resolved when she stopped it nor what would be done to prevent the recurrence of the paroxysmal A. Fib.    Possible that amiodarone affected tast???? Patient will continue on current chronic medications other than changes noted above, so ok to refill when needed.   See after visit summary for patient specific instructions.  Orders Placed This Encounter  Procedures  . Urine Culture  . DG Chest 2 View    Standing  Status:   Future    Number of Occurrences:   1    Standing Expiration Date:   05/15/2019    Order Specific Question:   Reason for Exam (SYMPTOM  OR DIAGNOSIS REQUIRED)    Answer:   RUL rales, new tachycardia    Order Specific Question:   Preferred imaging location?    Answer:   External  . PTH, Intact and Calcium  . Comprehensive metabolic panel  . Microalbumin / creatinine urine ratio  . TSH  . Hemoglobin A1c  . POCT Skin KOH  . POCT CBC  . POCT urinalysis dipstick  . POCT Microscopic Urinalysis ([facility])  . EKG 12-Lead    Meds ordered this encounter  Medications  . clotrimazole (MYCELEX) 10 MG troche    Sig: Take 1 tablet (10 mg total) by mouth 5 (five) times daily. Slowly dissolve in mouth, do not chew or swallow whole. Use ALL lozenges    Dispense:  70 tablet    Refill:  0    Patient verbalized to me that they understand the following: diagnosis, what is being done for them, what to expect and what should be done at home.  Their questions have been answered. They understand that I am unable to predict every possible medication interaction or adverse outcome and that if any unexpected symptoms arise, they should contact us and their pharmacist, as well as never hesitate to seek urgent/emergent care at [facility] or ER if they think it might be warranted.    Over 40 min spent in face-to-face evaluation of and consultation with patient and coordination of care.  Over 50% of this time was spent counseling this patient regarding concern for ongoing weight loss, recurrent of a. Fib due to cardiology does not appear to know that she was instructed to stop her carvedilol to 3.125 .  Delman Cheadle, MD, MPH [facility] [address] [place], [place]  [address] [phone] Office phone  [phone] Office fax  05/15/18 11:01 AM

## 2018-05-16 ENCOUNTER — Ambulatory Visit (HOSPITAL_COMMUNITY)
Admission: RE | Admit: 2018-05-16 | Discharge: 2018-05-16 | Disposition: A | Discharge status: Home / Self Care | Payer: Medicare Other | Attending: Cardiovascular Disease | Admitting: Cardiovascular Disease

## 2018-05-16 ENCOUNTER — Other Ambulatory Visit: Payer: Self-pay

## 2018-05-16 ENCOUNTER — Encounter (HOSPITAL_COMMUNITY): Payer: Self-pay | Admitting: Anesthesiology

## 2018-05-16 ENCOUNTER — Encounter (HOSPITAL_COMMUNITY)
Admission: RE | Disposition: A | Discharge status: Home / Self Care | Payer: Self-pay | Source: Home / Self Care | Attending: Cardiovascular Disease

## 2018-05-16 DIAGNOSIS — Z5309 Procedure and treatment not carried out because of other contraindication: Secondary | ICD-10-CM | POA: Insufficient documentation

## 2018-05-16 DIAGNOSIS — I4819 Other persistent atrial fibrillation: Secondary | ICD-10-CM

## 2018-05-16 LAB — COMPREHENSIVE METABOLIC PANEL
A/G RATIO: 1.9 (ref 1.2–2.2)
ALBUMIN: 4.7 g/dL (ref 3.5–4.7)
ALK PHOS: 46 IU/L (ref 39–117)
ALT: 40 IU/L — ABNORMAL HIGH (ref 0–32)
AST: 33 IU/L (ref 0–40)
BILIRUBIN TOTAL: 0.7 mg/dL (ref 0.0–1.2)
BUN / CREAT RATIO: 23 (ref 12–28)
BUN: 39 mg/dL — ABNORMAL HIGH (ref 8–27)
CHLORIDE: 102 mmol/L (ref 96–106)
CO2: 19 mmol/L — ABNORMAL LOW (ref 20–29)
Calcium: 10.8 mg/dL — ABNORMAL HIGH (ref 8.7–10.3)
Creatinine, Ser: 1.7 mg/dL — ABNORMAL HIGH (ref 0.57–1.00)
GFR calc Af Amer: 32 mL/min/{1.73_m2} — ABNORMAL LOW (ref >59)
GFR calc non Af Amer: 28 mL/min/{1.73_m2} — ABNORMAL LOW (ref >59)
GLOBULIN, TOTAL: 2.5 g/dL (ref 1.5–4.5)
GLUCOSE: 82 mg/dL (ref 65–99)
POTASSIUM: 4.7 mmol/L (ref 3.5–5.2)
SODIUM: 146 mmol/L — AB (ref 134–144)
Total Protein: 7.2 g/dL (ref 6.0–8.5)

## 2018-05-16 LAB — MICROALBUMIN / CREATININE URINE RATIO
CREATININE, UR: 138.6 mg/dL
MICROALBUM., U, RANDOM: 21.4 ug/mL
Microalb/Creat Ratio: 15.4 mg/g creat (ref 0.0–30.0)

## 2018-05-16 LAB — PTH, INTACT AND CALCIUM: PTH: 134 pg/mL — ABNORMAL HIGH (ref 15–65)

## 2018-05-16 LAB — TSH: TSH: 1.23 u[IU]/mL (ref 0.450–4.500)

## 2018-05-16 LAB — HEMOGLOBIN A1C
Est. average glucose Bld gHb Est-mCnc: 120 mg/dL
Hgb A1c MFr Bld: 5.8 % — ABNORMAL HIGH (ref 4.8–5.6)

## 2018-05-16 SURGERY — CANCELLED PROCEDURE

## 2018-05-16 NOTE — Progress Notes (Signed)
    Pt presented today for a cardioversion She had run out of her eliquis for several days last weel We discussed the importance of continuing her Eliquis for at least 3-4 weeks prior to cardioversion and following the cardioversion  She is asymptomatic  Will have her return to Dr. Haroldine Laws and reschedule for 3 weeks from today      Mertie Moores, MD  05/16/2018 2:22 PM    [facility] [address]  [place], [place]  [address] Pager [phone] Phone: [phone]; Fax: [phone]

## 2018-05-16 NOTE — Progress Notes (Signed)
While patient was getting ready for cardioversion, mentioned that she had missed a couple doses of her eliquis.  Made Dr. Acie Fredrickson aware.

## 2018-05-16 NOTE — Anesthesia Preprocedure Evaluation (Deleted)
Anesthesia Evaluation    Reviewed: Allergy & Precautions, Patient's Chart, lab work & pertinent test results  History of Anesthesia Complications (+) PONV and history of anesthetic complications  Airway        Dental   Pulmonary neg pulmonary ROS,           Cardiovascular hypertension, Pt. on medications + CAD, + Past MI and +CHF  + Valvular Problems/Murmurs    '19 TTE - moderate LVH. There was severe focal   basal hypertrophy of the septum. EF 45% to 50%.  Trivial AI. Mildly thickened leaflets with late systolic prolapse of the anterior MV leaflet. Moderate MR. Severely dilated LA. Moderate TR. PASP: 50 mm Hg     Neuro/Psych PSYCHIATRIC DISORDERS Anxiety negative neurological ROS     GI/Hepatic Neg liver ROS, PUD, GERD  ,  Endo/Other  negative endocrine ROS  Renal/GU Renal disease Kidney stones      Musculoskeletal negative musculoskeletal ROS (+)   Abdominal   Peds  Hematology  (+) anemia ,   Anesthesia Other Findings   Reproductive/Obstetrics  Breast cancer s/p radiation and lumpectomy                              Anesthesia Physical Anesthesia Plan  ASA: III  Anesthesia Plan: General   Post-op Pain Management:    Induction: Intravenous  PONV Risk Score and Plan: 4 or greater and Propofol infusion and Treatment may vary due to age or medical condition  Airway Management Planned: Natural Airway and Mask  Additional Equipment: None  Intra-op Plan:   Post-operative Plan:   Informed Consent:   Plan Discussed with: CRNA and Anesthesiologist  Anesthesia Plan Comments:         Anesthesia Quick Evaluation

## 2018-05-17 LAB — URINE CULTURE: Organism ID, Bacteria: NO GROWTH

## 2018-05-20 ENCOUNTER — Encounter (HOSPITAL_COMMUNITY): Payer: Self-pay | Admitting: Nurse Practitioner

## 2018-05-20 ENCOUNTER — Ambulatory Visit (HOSPITAL_COMMUNITY)
Admission: RE | Admit: 2018-05-20 | Discharge: 2018-05-20 | Disposition: A | Discharge status: Home / Self Care | Payer: Medicare Other | Source: Ambulatory Visit | Attending: Nurse Practitioner | Admitting: Nurse Practitioner

## 2018-05-20 VITALS — BP 130/72 | HR 106 | Ht 68.5 in | Wt 146.0 lb

## 2018-05-20 DIAGNOSIS — I252 Old myocardial infarction: Secondary | ICD-10-CM | POA: Insufficient documentation

## 2018-05-20 DIAGNOSIS — I4819 Other persistent atrial fibrillation: Secondary | ICD-10-CM | POA: Insufficient documentation

## 2018-05-20 DIAGNOSIS — Z955 Presence of coronary angioplasty implant and graft: Secondary | ICD-10-CM | POA: Insufficient documentation

## 2018-05-20 DIAGNOSIS — I2510 Atherosclerotic heart disease of native coronary artery without angina pectoris: Secondary | ICD-10-CM | POA: Insufficient documentation

## 2018-05-20 DIAGNOSIS — Z7901 Long term (current) use of anticoagulants: Secondary | ICD-10-CM | POA: Insufficient documentation

## 2018-05-20 DIAGNOSIS — Z9221 Personal history of antineoplastic chemotherapy: Secondary | ICD-10-CM | POA: Insufficient documentation

## 2018-05-20 DIAGNOSIS — E785 Hyperlipidemia, unspecified: Secondary | ICD-10-CM | POA: Diagnosis not present

## 2018-05-20 DIAGNOSIS — Z853 Personal history of malignant neoplasm of breast: Secondary | ICD-10-CM | POA: Diagnosis not present

## 2018-05-20 DIAGNOSIS — K219 Gastro-esophageal reflux disease without esophagitis: Secondary | ICD-10-CM | POA: Diagnosis not present

## 2018-05-20 DIAGNOSIS — M810 Age-related osteoporosis without current pathological fracture: Secondary | ICD-10-CM | POA: Diagnosis not present

## 2018-05-20 DIAGNOSIS — Z923 Personal history of irradiation: Secondary | ICD-10-CM | POA: Diagnosis not present

## 2018-05-20 DIAGNOSIS — R7303 Prediabetes: Secondary | ICD-10-CM | POA: Insufficient documentation

## 2018-05-20 DIAGNOSIS — I10 Essential (primary) hypertension: Secondary | ICD-10-CM | POA: Insufficient documentation

## 2018-05-20 DIAGNOSIS — Z8711 Personal history of peptic ulcer disease: Secondary | ICD-10-CM | POA: Diagnosis not present

## 2018-05-20 DIAGNOSIS — Z79899 Other long term (current) drug therapy: Secondary | ICD-10-CM | POA: Diagnosis not present

## 2018-05-20 DIAGNOSIS — I480 Paroxysmal atrial fibrillation: Secondary | ICD-10-CM | POA: Diagnosis not present

## 2018-05-20 DIAGNOSIS — Z951 Presence of aortocoronary bypass graft: Secondary | ICD-10-CM | POA: Diagnosis not present

## 2018-05-20 NOTE — Progress Notes (Addendum)
Primary Care Physician: Shawnee Knapp, MD Referring Physician: Dr. Hulan Saas [name] is a 83 y.o. female with a h/o  HTN, osteoporosis, L breast cancer s/p XRT and chemo and definitive surgery, HLD, and pre diabetes.   Admitted to [facility]  ED 07/27/17 with CP.Hospital course was complicated by acute respiratory failure, bradycardic arrest, Afib RVR, and cardiogenic shock in the setting of anterior MI. Found to have acute anterior MI and takenurgently to thecath lab. [place] 07/27/17 as below with 3v CAD, LVEF 35-40%, elevated LVEDP. Underwent PCI with successful stenting of the ramus intermediate artery with DES x 2. Had bradycardic arrest on arrival to ICU with epi/atropine and brief CPR and intubation. As she improved she was extubated on 2/24. An ECHO was completed an showed LVEF 40-45%.   Diuresed with IV lasix and once euvolemic transitioned to po lasix. HF medications optimized. Renal function was followed closely. She had several episodes of bradycardia so beta blocker was stopped. Later developed A fib RVR so amiodarone was loaded and she was started on eliquis. Chemically converted to NSR. She eventually had amiodarone stopped in July due to intolerance.   She saw her PCP last week and was found to be in afib with RVR. She was set up by HF for cardioversion the next day but had missed several doses of herDoac. Cardioversion was cancelled.  She  is now in the afib clinic for further f/u. She has afib at 106 bpm. She is tolerating well.  She feels slightly winded. Because of  profound bradycardia with BB's in the past, will not add rate control.  Today, she denies symptoms of palpitations, chest pain, shortness of breath, orthopnea, PND, lower extremity edema, dizziness, presyncope, syncope, or neurologic sequela.+ for shortness of breath. The patient is tolerating medications without difficulties and is otherwise without complaint today.   Past Medical History:  Diagnosis Date  .  Anxiety   . Breast cancer of lower-outer quadrant of left female breast ([place]) 10/11/2014   treated with lumpectomy and radiation  . Coronary artery disease 07/27/2017  . GERD (gastroesophageal reflux disease)   . Hypertension   . Myocardial infarction ([place]) 07/27/2017   STEMI of anterolateral wall. Cath w/ stent and cabg by Dr. Martinique  . Osteoporosis 10/20/2014  . Personal history of radiation therapy 2016   treatment of breast cancer  . PONV (postoperative nausea and vomiting)   . PUD (peptic ulcer disease)   . Pure hypercholesterolemia 04/08/2014  . Stone, kidney    Past Surgical History:  Procedure Laterality Date  . BREAST LUMPECTOMY Left 2016  . BREAST LUMPECTOMY WITH SENTINEL LYMPH NODE BIOPSY Left 10/22/2014   Procedure: BREAST LUMPECTOMY WITH SENTINEL LYMPH NODE BIOPSY;  Surgeon: Autumn Messing III, MD;  Location: [place];  Service: General;  Laterality: Left;  . CORONARY STENT INTERVENTION N/A 07/27/2017   Procedure: CORONARY STENT INTERVENTION;  Surgeon: Martinique, Peter M, MD;  Location: [place] [facility];  Service: Cardiovascular;  Laterality: N/A;  . CORONARY/GRAFT ACUTE MI REVASCULARIZATION N/A 07/27/2017   Procedure: Coronary/Graft Acute MI Revascularization;  Surgeon: Martinique, Peter M, MD;  Location: [place] [facility];  Service: Cardiovascular;  Laterality: N/A;  . LEFT HEART CATH AND CORONARY ANGIOGRAPHY N/A 07/27/2017   Procedure: LEFT HEART CATH AND CORONARY ANGIOGRAPHY;  Surgeon: Martinique, Peter M, MD;  Location: [place] [facility];  Service: Cardiovascular;  Laterality: N/A;  . RE-EXCISION OF BREAST CANCER,SUPERIOR MARGINS Left 11/04/2014   Procedure: RE-EXCISION  OF LEFT BREAST INFERIOR MARGINS;  Surgeon: Autumn Messing III, MD;  Location: [place];  Service: General;  Laterality: Left;    Current Outpatient Medications  Medication Sig Dispense Refill  . anastrozole (ARIMIDEX) 1 MG tablet TAKE ONE TABLET BY MOUTH ONCE DAILY 90 tablet 4  . atorvastatin (LIPITOR) 80 MG  tablet TAKE 1 TABLET BY MOUTH ONCE DAILY AT  6  PM 90 tablet 2  . Calcium Carb-Cholecalciferol (CALCIUM + D3) 600-800 MG-UNIT TABS Take 1 tablet by mouth daily.     . Cholecalciferol (VITAMIN D-3) 5000 units TABS Take by mouth.    . clopidogrel (PLAVIX) 75 MG tablet Take 1 tablet (75 mg total) by mouth daily. 30 tablet 6  . clotrimazole (MYCELEX) 10 MG troche Take 1 tablet (10 mg total) by mouth 5 (five) times daily. Slowly dissolve in mouth, do not chew or swallow whole. Use ALL lozenges 70 tablet 0  . co-enzyme Q-10 30 MG capsule Take 30 mg by mouth 3 (three) times daily.    Marland Kitchen ELIQUIS 2.5 MG TABS tablet TAKE 1 TABLET BY MOUTH TWICE DAILY 60 tablet 5  . fish oil-omega-3 fatty acids 1000 MG capsule Take 1 g by mouth daily.    . furosemide (LASIX) 20 MG tablet Take 1 tablet (20 mg total) by mouth as needed. 90 tablet 3  . losartan (COZAAR) 25 MG tablet Take 1 tablet (25 mg total) by mouth 2 (two) times daily. 60 tablet 6  . magnesium gluconate (MAGONATE) 500 MG tablet Take 500 mg by mouth 2 (two) times daily.    . Multiple Vitamin (MULTIVITAMIN) tablet Take 1 tablet by mouth daily.    Marland Kitchen spironolactone (ALDACTONE) 25 MG tablet TAKE 1 TABLET BY MOUTH ONCE DAILY 30 tablet 5  . nitroGLYCERIN (NITROSTAT) 0.3 MG SL tablet Place 1 tablet (0.3 mg total) under the tongue every 5 (five) minutes as needed for chest pain. (Patient not taking: Reported on 05/20/2018) 90 tablet 12   No current facility-administered medications for this encounter.     Allergies  Allergen Reactions  . Lisinopril Cough    Social History   Socioeconomic History  . Marital status: Married    Spouse name: Not on file  . Number of children: Not on file  . Years of education: Not on file  . Highest education level: Not on file  Occupational History  . Occupation: retired  Scientific laboratory technician  . Financial resource strain: Not on file  . Food insecurity:    Worry: Not on file    Inability: Not on file  . Transportation needs:      Medical: Not on file    Non-medical: Not on file  Tobacco Use  . Smoking status: Never Smoker  . Smokeless tobacco: Never Used  Substance and Sexual Activity  . Alcohol use: No  . Drug use: No  . Sexual activity: Not on file  Lifestyle  . Physical activity:    Days per week: Not on file    Minutes per session: Not on file  . Stress: Not on file  Relationships  . Social connections:    Talks on phone: Not on file    Gets together: Not on file    Attends religious service: Not on file    Active member of club or organization: Not on file    Attends meetings of clubs or organizations: Not on file    Relationship status: Not on file  . Intimate partner violence:    Fear  of current or ex partner: Not on file    Emotionally abused: Not on file    Physically abused: Not on file    Forced sexual activity: Not on file  Other Topics Concern  . Not on file  Social History Narrative  . Not on file    Family History  Problem Relation Age of Onset  . Pancreatic cancer Mother        pancreastic  . Stroke Father   . Hypertension Brother   . Hypertension Sister   . Hypertension Brother   . Heart disease Brother   . Hypertension Sister     ROS- All systems are reviewed and negative except as per the HPI above  Physical Exam: Vitals:   05/20/18 1331  BP: 130/72  Pulse: (!) 106  Weight: 66.2 kg  Height: 5' 8.5" (1.74 m)   Wt Readings from Last 3 Encounters:  05/20/18 66.2 kg  05/16/18 64.4 kg  05/15/18 64.4 kg    Labs: Lab Results  Component Value Date   NA 140 01/08/2018   K 4.5 01/08/2018   CL 109 01/08/2018   CO2 22 01/08/2018   GLUCOSE 93 01/08/2018   BUN 33 (H) 01/08/2018   CREATININE 1.85 (H) 01/08/2018   CALCIUM 10.3 01/10/2018   PHOS 4.7 (H) 07/28/2017   MG 2.3 08/02/2017   Lab Results  Component Value Date   INR 1.04 07/27/2017   Lab Results  Component Value Date   CHOL 156 01/10/2018   HDL 74 01/10/2018   LDLCALC 70 01/10/2018   TRIG 61  01/10/2018     GEN- The patient is well appearing, alert and oriented x 3 today.   Head- normocephalic, atraumatic Eyes-  Sclera clear, conjunctiva pink Ears- hearing intact Oropharynx- clear Neck- supple, no JVP Lymph- no cervical lymphadenopathy Lungs- Clear to ausculation bilaterally, normal work of breathing Heart- irregular rate and rhythm, no murmurs, rubs or gallops, PMI not laterally displaced GI- soft, NT, ND, + BS Extremities- no clubbing, cyanosis, or edema MS- no significant deformity or atrophy Skin- no rash or lesion Psych- euthymic mood, full affect Neuro- strength and sensation are intact  EKG-afib at 106 bpm, qrs int 118 ms, qtc 478 ms, LVH   Assessment and Plan: 1. Afib Persistent for a few days So far seems to be tolerating Will not start rate control as she has had profound brady in  the past with same She has missed does of DOAC so recent cardioversion was cancelled I did offer her a TEE guided DCCV, but she wishes to wait the 3 weeks required of uninterrupted anticoagulation. Will count 12/12 as her first day of uninterrupted anticoagulation as she can not remember when she last missed a dose.   I will bring back in 10 days to get DCCV scheduled. She was encouraged to weight herself daily and report any worsening shortness of breath.  [name], [facility] [address] [place], [place] [address] [phone]

## 2018-05-26 ENCOUNTER — Telehealth (HOSPITAL_COMMUNITY): Payer: Self-pay | Admitting: Cardiology

## 2018-05-26 NOTE — Telephone Encounter (Signed)
-----   Message from Adora Fridge, [place] sent at 04/07/2018  3:50 PM EST ----- Ok well we can give her samples in December bc if we try to get her on patient assistance it'll only cover her through the end of the year anyways.   ----- Message ----- From: Kerry Dory, CMA Sent: 04/07/2018   3:41 PM EST To: Adora Fridge, RPH-CPP  [phone] co pay for eliquis, patient did pay full co pay this month however unable to pay in the future  No samples needed at this time

## 2018-06-03 ENCOUNTER — Other Ambulatory Visit: Payer: Self-pay | Admitting: Oncology

## 2018-06-05 ENCOUNTER — Ambulatory Visit (HOSPITAL_COMMUNITY)
Admission: RE | Admit: 2018-06-05 | Discharge: 2018-06-05 | Disposition: A | Discharge status: Home / Self Care | Payer: Medicare Other | Source: Ambulatory Visit | Attending: Nurse Practitioner | Admitting: Nurse Practitioner

## 2018-06-05 ENCOUNTER — Encounter (HOSPITAL_COMMUNITY): Payer: Self-pay | Admitting: Nurse Practitioner

## 2018-06-05 VITALS — BP 136/72 | HR 105 | Ht 68.5 in | Wt 144.0 lb

## 2018-06-05 DIAGNOSIS — K219 Gastro-esophageal reflux disease without esophagitis: Secondary | ICD-10-CM | POA: Diagnosis not present

## 2018-06-05 DIAGNOSIS — Z79899 Other long term (current) drug therapy: Secondary | ICD-10-CM | POA: Insufficient documentation

## 2018-06-05 DIAGNOSIS — E785 Hyperlipidemia, unspecified: Secondary | ICD-10-CM | POA: Diagnosis not present

## 2018-06-05 DIAGNOSIS — M810 Age-related osteoporosis without current pathological fracture: Secondary | ICD-10-CM | POA: Diagnosis not present

## 2018-06-05 DIAGNOSIS — Z923 Personal history of irradiation: Secondary | ICD-10-CM | POA: Insufficient documentation

## 2018-06-05 DIAGNOSIS — Z8249 Family history of ischemic heart disease and other diseases of the circulatory system: Secondary | ICD-10-CM | POA: Diagnosis not present

## 2018-06-05 DIAGNOSIS — I2510 Atherosclerotic heart disease of native coronary artery without angina pectoris: Secondary | ICD-10-CM | POA: Diagnosis not present

## 2018-06-05 DIAGNOSIS — I4819 Other persistent atrial fibrillation: Secondary | ICD-10-CM

## 2018-06-05 DIAGNOSIS — I10 Essential (primary) hypertension: Secondary | ICD-10-CM | POA: Diagnosis not present

## 2018-06-05 DIAGNOSIS — Z9221 Personal history of antineoplastic chemotherapy: Secondary | ICD-10-CM | POA: Insufficient documentation

## 2018-06-05 DIAGNOSIS — Z951 Presence of aortocoronary bypass graft: Secondary | ICD-10-CM | POA: Insufficient documentation

## 2018-06-05 DIAGNOSIS — Z7901 Long term (current) use of anticoagulants: Secondary | ICD-10-CM | POA: Insufficient documentation

## 2018-06-05 DIAGNOSIS — Z853 Personal history of malignant neoplasm of breast: Secondary | ICD-10-CM | POA: Diagnosis not present

## 2018-06-05 DIAGNOSIS — R7303 Prediabetes: Secondary | ICD-10-CM | POA: Diagnosis not present

## 2018-06-05 DIAGNOSIS — I252 Old myocardial infarction: Secondary | ICD-10-CM | POA: Insufficient documentation

## 2018-06-05 DIAGNOSIS — Z8711 Personal history of peptic ulcer disease: Secondary | ICD-10-CM | POA: Insufficient documentation

## 2018-06-05 DIAGNOSIS — Z823 Family history of stroke: Secondary | ICD-10-CM | POA: Insufficient documentation

## 2018-06-05 DIAGNOSIS — Z808 Family history of malignant neoplasm of other organs or systems: Secondary | ICD-10-CM | POA: Diagnosis not present

## 2018-06-05 LAB — CBC
HCT: 31.2 % — ABNORMAL LOW (ref 36.0–46.0)
Hemoglobin: 9.3 g/dL — ABNORMAL LOW (ref 12.0–15.0)
MCH: 29.1 pg (ref 26.0–34.0)
MCHC: 29.8 g/dL — ABNORMAL LOW (ref 30.0–36.0)
MCV: 97.5 fL (ref 80.0–100.0)
NRBC: 0 % (ref 0.0–0.2)
Platelets: 205 10*3/uL (ref 150–400)
RBC: 3.2 MIL/uL — ABNORMAL LOW (ref 3.87–5.11)
RDW: 13.6 % (ref 11.5–15.5)
WBC: 4.3 10*3/uL (ref 4.0–10.5)

## 2018-06-05 LAB — BASIC METABOLIC PANEL
Anion gap: 9 (ref 5–15)
BUN: 37 mg/dL — ABNORMAL HIGH (ref 8–23)
CO2: 21 mmol/L — ABNORMAL LOW (ref 22–32)
Calcium: 10.4 mg/dL — ABNORMAL HIGH (ref 8.9–10.3)
Chloride: 109 mmol/L (ref 98–111)
Creatinine, Ser: 1.56 mg/dL — ABNORMAL HIGH (ref 0.44–1.00)
GFR calc Af Amer: 36 mL/min — ABNORMAL LOW (ref >60)
GFR calc non Af Amer: 31 mL/min — ABNORMAL LOW (ref >60)
Glucose, Bld: 124 mg/dL — ABNORMAL HIGH (ref 70–99)
Potassium: 4.1 mmol/L (ref 3.5–5.1)
Sodium: 139 mmol/L (ref 135–145)

## 2018-06-05 NOTE — Patient Instructions (Signed)
Cardioversion scheduled for Friday, January 10th  - Arrive at the [facility] and go to admitting at [facility] not eat or drink anything after midnight the night prior to your procedure.  - Take all your morning medication with a sip of water prior to arrival.  - You will not be able to drive home after your procedure.

## 2018-06-05 NOTE — H&P (View-Only) (Signed)
Primary Care Physician: Shawnee Knapp, MD Referring Physician: Dr. Hulan Saas [name] is a 84 y.o. female with a h/o  HTN, osteoporosis, L breast cancer s/p XRT and chemo and definitive surgery, HLD, and pre diabetes.   Admitted to [facility] 07/27/17 with CP.Hospital course was complicated by acute respiratory failure, bradycardic arrest, Afib RVR, and cardiogenic shock in the setting of anterior MI. Found to have acute anterior MI and takenurgently to thecath lab. [place] 07/27/17 as below with 3v CAD, LVEF 35-40%, elevated LVEDP. Underwent PCI with successful stenting of the ramus intermediate artery with DES x 2. Had bradycardic arrest on arrival to ICU with epi/atropine and brief CPR and intubation. As she improved she was extubated on 2/24. An ECHO was completed an showed LVEF 40-45%.   Diuresed with IV lasix and once euvolemic transitioned to po lasix. HF medications optimized. Renal function was followed closely. She had several episodes of bradycardia so beta blocker was stopped. Later developed A fib RVR so amiodarone was loaded and she was started on eliquis. Chemically converted to NSR. She eventually had amiodarone stopped in July due to intolerance.   She saw her PCP last week and was found to be in afib with RVR. She was set up by HF for cardioversion the next day but had missed several doses of her Doac. Cardioversion was cancelled.   She  is now in the afib clinic for further f/u. She has afib at 106 bpm. She is tolerating well.  She feels slightly winded. Because of  profound bradycardia with BB's in the past, will not add rate control.  F/u in afib clinic 06/05/18. She has now been on anticoagulation since 12/12 without any missed doses. Will schedule for cardioversion. She is still fairly  asymptomatic and weight is stable, actually down a couple of lbs.   Today, she denies symptoms of palpitations, chest pain, shortness of breath, orthopnea, PND, lower extremity edema,  dizziness, presyncope, syncope, or neurologic sequela.+ for shortness of breath. The patient is tolerating medications without difficulties and is otherwise without complaint today.   Past Medical History:  Diagnosis Date  . Anxiety   . Breast cancer of lower-outer quadrant of left female breast ([place]) 10/11/2014   treated with lumpectomy and radiation  . Coronary artery disease 07/27/2017  . GERD (gastroesophageal reflux disease)   . Hypertension   . Myocardial infarction ([place]) 07/27/2017   STEMI of anterolateral wall. Cath w/ stent and cabg by Dr. Martinique  . Osteoporosis 10/20/2014  . Personal history of radiation therapy 2016   treatment of breast cancer  . PONV (postoperative nausea and vomiting)   . PUD (peptic ulcer disease)   . Pure hypercholesterolemia 04/08/2014  . Stone, kidney    Past Surgical History:  Procedure Laterality Date  . BREAST LUMPECTOMY Left 2016  . BREAST LUMPECTOMY WITH SENTINEL LYMPH NODE BIOPSY Left 10/22/2014   Procedure: BREAST LUMPECTOMY WITH SENTINEL LYMPH NODE BIOPSY;  Surgeon: Autumn Messing III, MD;  Location: [place];  Service: General;  Laterality: Left;  . CORONARY STENT INTERVENTION N/A 07/27/2017   Procedure: CORONARY STENT INTERVENTION;  Surgeon: Martinique, Peter M, MD;  Location: [place] [facility];  Service: Cardiovascular;  Laterality: N/A;  . CORONARY/GRAFT ACUTE MI REVASCULARIZATION N/A 07/27/2017   Procedure: Coronary/Graft Acute MI Revascularization;  Surgeon: Martinique, Peter M, MD;  Location: [place] [facility];  Service: Cardiovascular;  Laterality: N/A;  . LEFT HEART CATH AND CORONARY ANGIOGRAPHY N/A 07/27/2017  Procedure: LEFT HEART CATH AND CORONARY ANGIOGRAPHY;  Surgeon: Martinique, Peter M, MD;  Location: [place] [facility];  Service: Cardiovascular;  Laterality: N/A;  . RE-EXCISION OF BREAST CANCER,SUPERIOR MARGINS Left 11/04/2014   Procedure: RE-EXCISION OF LEFT BREAST INFERIOR MARGINS;  Surgeon: Autumn Messing III, MD;  Location: [place];   Service: General;  Laterality: Left;    Current Outpatient Medications  Medication Sig Dispense Refill  . anastrozole (ARIMIDEX) 1 MG tablet TAKE 1 TABLET BY MOUTH ONCE DAILY 90 tablet 0  . atorvastatin (LIPITOR) 80 MG tablet TAKE 1 TABLET BY MOUTH ONCE DAILY AT  6  PM 90 tablet 2  . Calcium Carb-Cholecalciferol (CALCIUM + D3) 600-800 MG-UNIT TABS Take 1 tablet by mouth daily.     . Cholecalciferol (VITAMIN D-3) 5000 units TABS Take by mouth.    . clopidogrel (PLAVIX) 75 MG tablet Take 1 tablet (75 mg total) by mouth daily. 30 tablet 6  . clotrimazole (MYCELEX) 10 MG troche Take 1 tablet (10 mg total) by mouth 5 (five) times daily. Slowly dissolve in mouth, do not chew or swallow whole. Use ALL lozenges 70 tablet 0  . co-enzyme Q-10 30 MG capsule Take 30 mg by mouth 3 (three) times daily.    Marland Kitchen ELIQUIS 2.5 MG TABS tablet TAKE 1 TABLET BY MOUTH TWICE DAILY 60 tablet 5  . fish oil-omega-3 fatty acids 1000 MG capsule Take 1 g by mouth 2 (two) times daily.     Marland Kitchen losartan (COZAAR) 25 MG tablet Take 1 tablet (25 mg total) by mouth 2 (two) times daily. 60 tablet 6  . magnesium gluconate (MAGONATE) 500 MG tablet Take 500 mg by mouth 2 (two) times daily.    . Multiple Vitamin (MULTIVITAMIN) tablet Take 1 tablet by mouth daily.    Marland Kitchen spironolactone (ALDACTONE) 25 MG tablet TAKE 1 TABLET BY MOUTH ONCE DAILY 30 tablet 5  . furosemide (LASIX) 20 MG tablet Take 1 tablet (20 mg total) by mouth as needed. 90 tablet 3  . nitroGLYCERIN (NITROSTAT) 0.3 MG SL tablet Place 1 tablet (0.3 mg total) under the tongue every 5 (five) minutes as needed for chest pain. (Patient not taking: Reported on 06/05/2018) 90 tablet 12   No current facility-administered medications for this encounter.     Allergies  Allergen Reactions  . Lisinopril Cough    Social History   Socioeconomic History  . Marital status: Married    Spouse name: Not on file  . Number of children: Not on file  . Years of education: Not on file  .  Highest education level: Not on file  Occupational History  . Occupation: retired  Scientific laboratory technician  . Financial resource strain: Not on file  . Food insecurity:    Worry: Not on file    Inability: Not on file  . Transportation needs:    Medical: Not on file    Non-medical: Not on file  Tobacco Use  . Smoking status: Never Smoker  . Smokeless tobacco: Never Used  Substance and Sexual Activity  . Alcohol use: No  . Drug use: No  . Sexual activity: Not on file  Lifestyle  . Physical activity:    Days per week: Not on file    Minutes per session: Not on file  . Stress: Not on file  Relationships  . Social connections:    Talks on phone: Not on file    Gets together: Not on file    Attends religious service: Not on file  Active member of club or organization: Not on file    Attends meetings of clubs or organizations: Not on file    Relationship status: Not on file  . Intimate partner violence:    Fear of current or ex partner: Not on file    Emotionally abused: Not on file    Physically abused: Not on file    Forced sexual activity: Not on file  Other Topics Concern  . Not on file  Social History Narrative  . Not on file    Family History  Problem Relation Age of Onset  . Pancreatic cancer Mother        pancreastic  . Stroke Father   . Hypertension Brother   . Hypertension Sister   . Hypertension Brother   . Heart disease Brother   . Hypertension Sister     ROS- All systems are reviewed and negative except as per the HPI above  Physical Exam: Vitals:   06/05/18 1036  BP: 136/72  Pulse: (!) 105  Weight: 65.3 kg  Height: 5' 8.5" (1.74 m)   Wt Readings from Last 3 Encounters:  06/05/18 65.3 kg  05/20/18 66.2 kg  05/16/18 64.4 kg    Labs: Lab Results  Component Value Date   NA 139 06/05/2018   K 4.1 06/05/2018   CL 109 06/05/2018   CO2 21 (L) 06/05/2018   GLUCOSE 124 (H) 06/05/2018   BUN 37 (H) 06/05/2018   CREATININE 1.56 (H) 06/05/2018   CALCIUM  10.4 (H) 06/05/2018   PHOS 4.7 (H) 07/28/2017   MG 2.3 08/02/2017   Lab Results  Component Value Date   INR 1.04 07/27/2017   Lab Results  Component Value Date   CHOL 156 01/10/2018   HDL 74 01/10/2018   LDLCALC 70 01/10/2018   TRIG 61 01/10/2018     GEN- The patient is well appearing, alert and oriented x 3 today.   Head- normocephalic, atraumatic Eyes-  Sclera clear, conjunctiva pink Ears- hearing intact Oropharynx- clear Neck- supple, no JVP Lymph- no cervical lymphadenopathy Lungs- Clear to ausculation bilaterally, normal work of breathing Heart- irregular rate and rhythm, no murmurs, rubs or gallops, PMI not laterally displaced GI- soft, NT, ND, + BS Extremities- no clubbing, cyanosis, or edema MS- no significant deformity or atrophy Skin- no rash or lesion Psych- euthymic mood, full affect Neuro- strength and sensation are intact  EKG-afib at 105 bpm, qrs int 114 ms, qtc 478 ms, IRBBB, LVH   Assessment and Plan: 1. Afib Persistent now for several weeks So far seems to be tolerating, reasonable rate control Will not start rate control as she has had profound brady in  the past with same She had missed does of DOAC so previous cardioversion was cancelled I did offer her a TEE guided DCCV, but she wished to wait the 3 weeks required of uninterrupted anticoagulation.  She is now ready as she tells me she has not had any missed doses of eliquis since 12/12 Scheduled for cardioversion 1/10 with Dr. Haroldine Laws Cbc/bmet today  F/u with afib clinic one week after cardioversion  Butch Penny C. Renad Jenniges, [facility] [address] [place], [place] [address] [phone]

## 2018-06-05 NOTE — Progress Notes (Signed)
Primary Care Physician: Shawnee Knapp, MD Referring Physician: Dr. Hulan Saas [name] is a 84 y.o. female with a h/o  HTN, osteoporosis, L breast cancer s/p XRT and chemo and definitive surgery, HLD, and pre diabetes.   Admitted to [facility] 07/27/17 with CP.Hospital course was complicated by acute respiratory failure, bradycardic arrest, Afib RVR, and cardiogenic shock in the setting of anterior MI. Found to have acute anterior MI and takenurgently to thecath lab. [place] 07/27/17 as below with 3v CAD, LVEF 35-40%, elevated LVEDP. Underwent PCI with successful stenting of the ramus intermediate artery with DES x 2. Had bradycardic arrest on arrival to ICU with epi/atropine and brief CPR and intubation. As she improved she was extubated on 2/24. An ECHO was completed an showed LVEF 40-45%.   Diuresed with IV lasix and once euvolemic transitioned to po lasix. HF medications optimized. Renal function was followed closely. She had several episodes of bradycardia so beta blocker was stopped. Later developed A fib RVR so amiodarone was loaded and she was started on eliquis. Chemically converted to NSR. She eventually had amiodarone stopped in July due to intolerance.   She saw her PCP last week and was found to be in afib with RVR. She was set up by HF for cardioversion the next day but had missed several doses of her Doac. Cardioversion was cancelled.   She  is now in the afib clinic for further f/u. She has afib at 106 bpm. She is tolerating well.  She feels slightly winded. Because of  profound bradycardia with BB's in the past, will not add rate control.  F/u in afib clinic 06/05/18. She has now been on anticoagulation since 12/12 without any missed doses. Will schedule for cardioversion. She is still fairly  asymptomatic and weight is stable, actually down a couple of lbs.   Today, she denies symptoms of palpitations, chest pain, shortness of breath, orthopnea, PND, lower extremity edema,  dizziness, presyncope, syncope, or neurologic sequela.+ for shortness of breath. The patient is tolerating medications without difficulties and is otherwise without complaint today.   Past Medical History:  Diagnosis Date  . Anxiety   . Breast cancer of lower-outer quadrant of left female breast ([place]) 10/11/2014   treated with lumpectomy and radiation  . Coronary artery disease 07/27/2017  . GERD (gastroesophageal reflux disease)   . Hypertension   . Myocardial infarction ([place]) 07/27/2017   STEMI of anterolateral wall. Cath w/ stent and cabg by Dr. Martinique  . Osteoporosis 10/20/2014  . Personal history of radiation therapy 2016   treatment of breast cancer  . PONV (postoperative nausea and vomiting)   . PUD (peptic ulcer disease)   . Pure hypercholesterolemia 04/08/2014  . Stone, kidney    Past Surgical History:  Procedure Laterality Date  . BREAST LUMPECTOMY Left 2016  . BREAST LUMPECTOMY WITH SENTINEL LYMPH NODE BIOPSY Left 10/22/2014   Procedure: BREAST LUMPECTOMY WITH SENTINEL LYMPH NODE BIOPSY;  Surgeon: Autumn Messing III, MD;  Location: [facility];  Service: General;  Laterality: Left;  . CORONARY STENT INTERVENTION N/A 07/27/2017   Procedure: CORONARY STENT INTERVENTION;  Surgeon: Martinique, Peter M, MD;  Location: [place] [facility];  Service: Cardiovascular;  Laterality: N/A;  . CORONARY/GRAFT ACUTE MI REVASCULARIZATION N/A 07/27/2017   Procedure: Coronary/Graft Acute MI Revascularization;  Surgeon: Martinique, Peter M, MD;  Location: [place];  Service: Cardiovascular;  Laterality: N/A;  . LEFT HEART CATH AND CORONARY ANGIOGRAPHY N/A 07/27/2017  Procedure: LEFT HEART CATH AND CORONARY ANGIOGRAPHY;  Surgeon: Martinique, Peter M, MD;  Location: [facility];  Service: Cardiovascular;  Laterality: N/A;  . RE-EXCISION OF BREAST CANCER,SUPERIOR MARGINS Left 11/04/2014   Procedure: RE-EXCISION OF LEFT BREAST INFERIOR MARGINS;  Surgeon: Autumn Messing III, MD;  Location: [facility];   Service: General;  Laterality: Left;    Current Outpatient Medications  Medication Sig Dispense Refill  . anastrozole (ARIMIDEX) 1 MG tablet TAKE 1 TABLET BY MOUTH ONCE DAILY 90 tablet 0  . atorvastatin (LIPITOR) 80 MG tablet TAKE 1 TABLET BY MOUTH ONCE DAILY AT  6  PM 90 tablet 2  . Calcium Carb-Cholecalciferol (CALCIUM + D3) 600-800 MG-UNIT TABS Take 1 tablet by mouth daily.     . Cholecalciferol (VITAMIN D-3) 5000 units TABS Take by mouth.    . clopidogrel (PLAVIX) 75 MG tablet Take 1 tablet (75 mg total) by mouth daily. 30 tablet 6  . clotrimazole (MYCELEX) 10 MG troche Take 1 tablet (10 mg total) by mouth 5 (five) times daily. Slowly dissolve in mouth, do not chew or swallow whole. Use ALL lozenges 70 tablet 0  . co-enzyme Q-10 30 MG capsule Take 30 mg by mouth 3 (three) times daily.    Marland Kitchen ELIQUIS 2.5 MG TABS tablet TAKE 1 TABLET BY MOUTH TWICE DAILY 60 tablet 5  . fish oil-omega-3 fatty acids 1000 MG capsule Take 1 g by mouth 2 (two) times daily.     Marland Kitchen losartan (COZAAR) 25 MG tablet Take 1 tablet (25 mg total) by mouth 2 (two) times daily. 60 tablet 6  . magnesium gluconate (MAGONATE) 500 MG tablet Take 500 mg by mouth 2 (two) times daily.    . Multiple Vitamin (MULTIVITAMIN) tablet Take 1 tablet by mouth daily.    Marland Kitchen spironolactone (ALDACTONE) 25 MG tablet TAKE 1 TABLET BY MOUTH ONCE DAILY 30 tablet 5  . furosemide (LASIX) 20 MG tablet Take 1 tablet (20 mg total) by mouth as needed. 90 tablet 3  . nitroGLYCERIN (NITROSTAT) 0.3 MG SL tablet Place 1 tablet (0.3 mg total) under the tongue every 5 (five) minutes as needed for chest pain. (Patient not taking: Reported on 06/05/2018) 90 tablet 12   No current facility-administered medications for this encounter.     Allergies  Allergen Reactions  . Lisinopril Cough    Social History   Socioeconomic History  . Marital status: Married    Spouse name: Not on file  . Number of children: Not on file  . Years of education: Not on file  .  Highest education level: Not on file  Occupational History  . Occupation: retired  Scientific laboratory technician  . Financial resource strain: Not on file  . Food insecurity:    Worry: Not on file    Inability: Not on file  . Transportation needs:    Medical: Not on file    Non-medical: Not on file  Tobacco Use  . Smoking status: Never Smoker  . Smokeless tobacco: Never Used  Substance and Sexual Activity  . Alcohol use: No  . Drug use: No  . Sexual activity: Not on file  Lifestyle  . Physical activity:    Days per week: Not on file    Minutes per session: Not on file  . Stress: Not on file  Relationships  . Social connections:    Talks on phone: Not on file    Gets together: Not on file    Attends religious service: Not on file  Active member of club or organization: Not on file    Attends meetings of clubs or organizations: Not on file    Relationship status: Not on file  . Intimate partner violence:    Fear of current or ex partner: Not on file    Emotionally abused: Not on file    Physically abused: Not on file    Forced sexual activity: Not on file  Other Topics Concern  . Not on file  Social History Narrative  . Not on file    Family History  Problem Relation Age of Onset  . Pancreatic cancer Mother        pancreastic  . Stroke Father   . Hypertension Brother   . Hypertension Sister   . Hypertension Brother   . Heart disease Brother   . Hypertension Sister     ROS- All systems are reviewed and negative except as per the HPI above  Physical Exam: Vitals:   06/05/18 1036  BP: 136/72  Pulse: (!) 105  Weight: 65.3 kg  Height: 5' 8.5" (1.74 m)   Wt Readings from Last 3 Encounters:  06/05/18 65.3 kg  05/20/18 66.2 kg  05/16/18 64.4 kg    Labs: Lab Results  Component Value Date   NA 139 06/05/2018   K 4.1 06/05/2018   CL 109 06/05/2018   CO2 21 (L) 06/05/2018   GLUCOSE 124 (H) 06/05/2018   BUN 37 (H) 06/05/2018   CREATININE 1.56 (H) 06/05/2018   CALCIUM  10.4 (H) 06/05/2018   PHOS 4.7 (H) 07/28/2017   MG 2.3 08/02/2017   Lab Results  Component Value Date   INR 1.04 07/27/2017   Lab Results  Component Value Date   CHOL 156 01/10/2018   HDL 74 01/10/2018   LDLCALC 70 01/10/2018   TRIG 61 01/10/2018     GEN- The patient is well appearing, alert and oriented x 3 today.   Head- normocephalic, atraumatic Eyes-  Sclera clear, conjunctiva pink Ears- hearing intact Oropharynx- clear Neck- supple, no JVP Lymph- no cervical lymphadenopathy Lungs- Clear to ausculation bilaterally, normal work of breathing Heart- irregular rate and rhythm, no murmurs, rubs or gallops, PMI not laterally displaced GI- soft, NT, ND, + BS Extremities- no clubbing, cyanosis, or edema MS- no significant deformity or atrophy Skin- no rash or lesion Psych- euthymic mood, full affect Neuro- strength and sensation are intact  EKG-afib at 105 bpm, qrs int 114 ms, qtc 478 ms, IRBBB, LVH   Assessment and Plan: 1. Afib Persistent now for several weeks So far seems to be tolerating, reasonable rate control Will not start rate control as she has had profound brady in  the past with same She had missed does of DOAC so previous cardioversion was cancelled I did offer her a TEE guided DCCV, but she wished to wait the 3 weeks required of uninterrupted anticoagulation.  She is now ready as she tells me she has not had any missed doses of eliquis since 12/12 Scheduled for cardioversion 1/10 with Dr. Haroldine Laws Cbc/bmet today  F/u with afib clinic one week after cardioversion  Butch Penny C. Graylin Sperling, [facility] [address] [place], [place] [address] [phone]

## 2018-06-09 DIAGNOSIS — N184 Chronic kidney disease, stage 4 (severe): Secondary | ICD-10-CM | POA: Diagnosis not present

## 2018-06-09 DIAGNOSIS — I129 Hypertensive chronic kidney disease with stage 1 through stage 4 chronic kidney disease, or unspecified chronic kidney disease: Secondary | ICD-10-CM | POA: Diagnosis not present

## 2018-06-09 DIAGNOSIS — I4891 Unspecified atrial fibrillation: Secondary | ICD-10-CM | POA: Diagnosis not present

## 2018-06-10 ENCOUNTER — Telehealth: Payer: Self-pay | Admitting: Family Medicine

## 2018-06-10 NOTE — Telephone Encounter (Signed)
MyChart message sent to pt about their appointment on 06/26/18 with Dr Brigitte Pulse

## 2018-06-13 ENCOUNTER — Ambulatory Visit (HOSPITAL_COMMUNITY): Payer: Medicare Other | Admitting: Certified Registered"

## 2018-06-13 ENCOUNTER — Encounter (HOSPITAL_COMMUNITY)
Admission: RE | Disposition: A | Discharge status: Home / Self Care | Payer: Self-pay | Source: Home / Self Care | Attending: Internal Medicine

## 2018-06-13 ENCOUNTER — Ambulatory Visit (HOSPITAL_COMMUNITY)
Admission: RE | Admit: 2018-06-13 | Discharge: 2018-06-13 | Disposition: A | Discharge status: Home / Self Care | Payer: Medicare Other | Attending: Internal Medicine | Admitting: Internal Medicine

## 2018-06-13 ENCOUNTER — Encounter (HOSPITAL_COMMUNITY): Payer: Self-pay

## 2018-06-13 DIAGNOSIS — Z7902 Long term (current) use of antithrombotics/antiplatelets: Secondary | ICD-10-CM | POA: Diagnosis not present

## 2018-06-13 DIAGNOSIS — I10 Essential (primary) hypertension: Secondary | ICD-10-CM | POA: Insufficient documentation

## 2018-06-13 DIAGNOSIS — Z823 Family history of stroke: Secondary | ICD-10-CM | POA: Diagnosis not present

## 2018-06-13 DIAGNOSIS — Z7901 Long term (current) use of anticoagulants: Secondary | ICD-10-CM | POA: Insufficient documentation

## 2018-06-13 DIAGNOSIS — E7800 Pure hypercholesterolemia, unspecified: Secondary | ICD-10-CM | POA: Diagnosis not present

## 2018-06-13 DIAGNOSIS — Z8249 Family history of ischemic heart disease and other diseases of the circulatory system: Secondary | ICD-10-CM | POA: Insufficient documentation

## 2018-06-13 DIAGNOSIS — K219 Gastro-esophageal reflux disease without esophagitis: Secondary | ICD-10-CM | POA: Diagnosis not present

## 2018-06-13 DIAGNOSIS — M810 Age-related osteoporosis without current pathological fracture: Secondary | ICD-10-CM | POA: Diagnosis not present

## 2018-06-13 DIAGNOSIS — I2510 Atherosclerotic heart disease of native coronary artery without angina pectoris: Secondary | ICD-10-CM | POA: Diagnosis not present

## 2018-06-13 DIAGNOSIS — I4891 Unspecified atrial fibrillation: Secondary | ICD-10-CM | POA: Insufficient documentation

## 2018-06-13 DIAGNOSIS — Z955 Presence of coronary angioplasty implant and graft: Secondary | ICD-10-CM | POA: Diagnosis not present

## 2018-06-13 DIAGNOSIS — Z888 Allergy status to other drugs, medicaments and biological substances status: Secondary | ICD-10-CM | POA: Insufficient documentation

## 2018-06-13 DIAGNOSIS — I252 Old myocardial infarction: Secondary | ICD-10-CM | POA: Insufficient documentation

## 2018-06-13 DIAGNOSIS — Z951 Presence of aortocoronary bypass graft: Secondary | ICD-10-CM | POA: Diagnosis not present

## 2018-06-13 DIAGNOSIS — Z79899 Other long term (current) drug therapy: Secondary | ICD-10-CM | POA: Insufficient documentation

## 2018-06-13 HISTORY — PX: CARDIOVERSION: SHX1299

## 2018-06-13 LAB — POCT I-STAT 4, (NA,K, GLUC, HGB,HCT)
Glucose, Bld: 86 mg/dL (ref 70–99)
HCT: 28 % — ABNORMAL LOW (ref 36.0–46.0)
Hemoglobin: 9.5 g/dL — ABNORMAL LOW (ref 12.0–15.0)
Potassium: 4.4 mmol/L (ref 3.5–5.1)
Sodium: 142 mmol/L (ref 135–145)

## 2018-06-13 SURGERY — CARDIOVERSION
Anesthesia: General

## 2018-06-13 MED ORDER — SODIUM CHLORIDE 0.9 % IV SOLN
INTRAVENOUS | Status: DC
  Administered 2018-06-13: 10:00:00 via INTRAVENOUS

## 2018-06-13 MED ORDER — LIDOCAINE HCL (CARDIAC) PF 100 MG/5ML IV SOSY
PREFILLED_SYRINGE | INTRAVENOUS | Status: DC | PRN
  Administered 2018-06-13: 20 mg via INTRAVENOUS

## 2018-06-13 MED ORDER — SODIUM CHLORIDE 0.9 % IV SOLN
INTRAVENOUS | Status: DC | PRN
  Administered 2018-06-13: 11:00:00 via INTRAVENOUS

## 2018-06-13 MED ORDER — PROPOFOL 10 MG/ML IV BOLUS
INTRAVENOUS | Status: DC | PRN
  Administered 2018-06-13: 60 mg via INTRAVENOUS

## 2018-06-13 NOTE — Transfer of Care (Signed)
Immediate Anesthesia Transfer of Care Note  Patient: [name]  Procedure(s) Performed: CARDIOVERSION (N/A )  Patient Location: Endoscopy Unit  Anesthesia Type:General  Level of Consciousness: awake, alert , oriented and drowsy  Airway & Oxygen Therapy: Patient Spontanous Breathing  Post-op Assessment: Report given to RN, Post -op Vital signs reviewed and stable and Patient moving all extremities X 4  Post vital signs: Reviewed and stable  Last Vitals:  Vitals Value Taken Time  BP 103/58 06/13/2018 10:49 AM  Temp    Pulse 86 06/13/2018 10:51 AM  Resp 26 06/13/2018 10:51 AM  SpO2 99 % 06/13/2018 10:51 AM  Vitals shown include unvalidated device data.  Last Pain:  Vitals:   06/13/18 0922  TempSrc: Oral  PainSc: 0-No pain         Complications: No apparent anesthesia complications

## 2018-06-13 NOTE — CV Procedure (Signed)
    DIRECT CURRENT CARDIOVERSION  NAME:  [name]   MRN: [id number] DOB:  1934/07/12   ADMIT DATE: 06/13/2018   INDICATIONS: Atrial fibrillation    PROCEDURE:   Informed consent was obtained prior to the procedure. The risks, benefits and alternatives for the procedure were discussed and the patient comprehended these risks. Once an appropriate time out was taken, the patient had the defibrillator pads placed in the anterior and posterior position. The patient then underwent sedation by the anesthesia service. Once an appropriate level of sedation was achieved, the patient received a single biphasic, synchronized 150J shock with prompt conversion to sinus rhythm. No apparent complications.  Glori Bickers, MD  10:47 AM

## 2018-06-13 NOTE — Progress Notes (Signed)
ISTAT obtained, results did not move over to chart. Results listed below.   ISTAT @ 0940  Na 142 K 4.4 Glu 86 Hct 28 Hb 9.5  MD and MDA made aware of results.

## 2018-06-13 NOTE — Anesthesia Preprocedure Evaluation (Addendum)
Anesthesia Evaluation  Patient identified by MRN, date of birth, ID band Patient awake    Reviewed: Allergy & Precautions, NPO status , Patient's Chart, lab work & pertinent test results  History of Anesthesia Complications (+) PONV  Airway Mallampati: I  TM Distance: >3 FB Neck ROM: Full    Dental  (+) Dental Advisory Given   Pulmonary neg pulmonary ROS,    breath sounds clear to auscultation       Cardiovascular hypertension, Pt. on medications (-) angina+ CAD, + Past MI and + Cardiac Stents  + dysrhythmias Atrial Fibrillation  Rhythm:Irregular Rate:Tachycardia  '19 ECHO: EF 45-50%, mod MR, mod TR   Neuro/Psych Anxiety negative neurological ROS     GI/Hepatic Neg liver ROS, PUD, GERD  Controlled,  Endo/Other    Renal/GU Renal InsufficiencyRenal disease     Musculoskeletal   Abdominal   Peds  Hematology  (+) Blood dyscrasia (Hb 9.3), anemia , eliquis   Anesthesia Other Findings   Reproductive/Obstetrics                            Anesthesia Physical Anesthesia Plan  ASA: III  Anesthesia Plan: General   Post-op Pain Management:    Induction: Intravenous  PONV Risk Score and Plan: Treatment may vary due to age or medical condition  Airway Management Planned: Natural Airway and Mask  Additional Equipment:   Intra-op Plan:   Post-operative Plan:   Informed Consent: I have reviewed the patients History and Physical, chart, labs and discussed the procedure including the risks, benefits and alternatives for the proposed anesthesia with the patient or authorized representative who has indicated his/her understanding and acceptance.   Dental advisory given  Plan Discussed with: CRNA and Surgeon  Anesthesia Plan Comments:        Anesthesia Quick Evaluation

## 2018-06-13 NOTE — Discharge Instructions (Signed)
Electrical Cardioversion, Care After °This sheet gives you information about how to care for yourself after your procedure. Your health care provider may also give you more specific instructions. If you have problems or questions, contact your health care provider. °What can I expect after the procedure? °After the procedure, it is common to have: °· Some redness on the skin where the shocks were given. °Follow these instructions at home: ° °· Do not drive for 24 hours if you were given a medicine to help you relax (sedative). °· Take over-the-counter and prescription medicines only as told by your health care provider. °· Ask your health care provider how to check your pulse. Check it often. °· Rest for 48 hours after the procedure or as told by your health care provider. °· Avoid or limit your caffeine use as told by your health care provider. °Contact a health care provider if: °· You feel like your heart is beating too quickly or your pulse is not regular. °· You have a serious muscle cramp that does not go away. °Get help right away if: ° °· You have discomfort in your chest. °· You are dizzy or you feel faint. °· You have trouble breathing or you are short of breath. °· Your speech is slurred. °· You have trouble moving an arm or leg on one side of your body. °· Your fingers or toes turn cold or blue. °This information is not intended to replace advice given to you by your health care provider. Make sure you discuss any questions you have with your health care provider. °Document Released: 03/11/2013 Document Revised: 12/23/2015 Document Reviewed: 11/25/2015 °Elsevier Interactive Patient Education © 2019 [employer] ° °

## 2018-06-13 NOTE — Anesthesia Postprocedure Evaluation (Signed)
Anesthesia Post Note  Patient: [name]  Procedure(s) Performed: CARDIOVERSION (N/A )     Patient location during evaluation: Endoscopy Anesthesia Type: General Level of consciousness: awake and alert, patient cooperative and oriented Pain management: pain level controlled Vital Signs Assessment: post-procedure vital signs reviewed and stable Respiratory status: spontaneous breathing, nonlabored ventilation and respiratory function stable Cardiovascular status: blood pressure returned to baseline and stable Postop Assessment: no apparent nausea or vomiting Anesthetic complications: no    Last Vitals:  Vitals:   06/13/18 1104 06/13/18 1110  BP: 110/61   Pulse: 85 83  Resp: (!) 22 (!) 27  Temp:    SpO2: 100% 100%    Last Pain:  Vitals:   06/13/18 1104  TempSrc:   PainSc: 0-No pain                 Ritvik Mczeal,E. Fern Canova

## 2018-06-13 NOTE — Interval H&P Note (Signed)
History and Physical Interval Note:  06/13/2018 10:23 AM  [name]  has presented today for surgery, with the diagnosis of AFIB  The various methods of treatment have been discussed with the patient and family. After consideration of risks, benefits and other options for treatment, the patient has consented to  Procedure(s): CARDIOVERSION (N/A) as a surgical intervention .  The patient's history has been reviewed, patient examined, no change in status, stable for surgery.  I have reviewed the patient's chart and labs.  Questions were answered to the patient's satisfaction.     Daniel Bensimhon

## 2018-06-15 ENCOUNTER — Encounter (HOSPITAL_COMMUNITY): Payer: Self-pay | Admitting: Internal Medicine

## 2018-06-19 ENCOUNTER — Encounter (HOSPITAL_COMMUNITY): Payer: Self-pay | Admitting: Nurse Practitioner

## 2018-06-19 ENCOUNTER — Ambulatory Visit (HOSPITAL_COMMUNITY)
Admission: RE | Admit: 2018-06-19 | Discharge: 2018-06-19 | Disposition: A | Discharge status: Home / Self Care | Payer: Medicare Other | Source: Ambulatory Visit | Attending: Nurse Practitioner | Admitting: Nurse Practitioner

## 2018-06-19 VITALS — BP 118/72 | HR 114 | Ht 68.5 in | Wt 141.0 lb

## 2018-06-19 DIAGNOSIS — I4819 Other persistent atrial fibrillation: Secondary | ICD-10-CM

## 2018-06-19 DIAGNOSIS — Z79899 Other long term (current) drug therapy: Secondary | ICD-10-CM | POA: Diagnosis not present

## 2018-06-19 DIAGNOSIS — Z7901 Long term (current) use of anticoagulants: Secondary | ICD-10-CM | POA: Insufficient documentation

## 2018-06-19 DIAGNOSIS — Z8249 Family history of ischemic heart disease and other diseases of the circulatory system: Secondary | ICD-10-CM | POA: Insufficient documentation

## 2018-06-19 DIAGNOSIS — I2510 Atherosclerotic heart disease of native coronary artery without angina pectoris: Secondary | ICD-10-CM | POA: Diagnosis not present

## 2018-06-19 DIAGNOSIS — Z7902 Long term (current) use of antithrombotics/antiplatelets: Secondary | ICD-10-CM | POA: Diagnosis not present

## 2018-06-19 DIAGNOSIS — C50512 Malignant neoplasm of lower-outer quadrant of left female breast: Secondary | ICD-10-CM | POA: Insufficient documentation

## 2018-06-19 DIAGNOSIS — Z8042 Family history of malignant neoplasm of prostate: Secondary | ICD-10-CM | POA: Diagnosis not present

## 2018-06-19 DIAGNOSIS — I10 Essential (primary) hypertension: Secondary | ICD-10-CM | POA: Insufficient documentation

## 2018-06-19 DIAGNOSIS — Z888 Allergy status to other drugs, medicaments and biological substances status: Secondary | ICD-10-CM | POA: Diagnosis not present

## 2018-06-19 DIAGNOSIS — I4510 Unspecified right bundle-branch block: Secondary | ICD-10-CM | POA: Diagnosis not present

## 2018-06-19 DIAGNOSIS — E7800 Pure hypercholesterolemia, unspecified: Secondary | ICD-10-CM | POA: Diagnosis not present

## 2018-06-19 DIAGNOSIS — R9431 Abnormal electrocardiogram [ECG] [EKG]: Secondary | ICD-10-CM | POA: Diagnosis not present

## 2018-06-19 DIAGNOSIS — I252 Old myocardial infarction: Secondary | ICD-10-CM | POA: Diagnosis not present

## 2018-06-19 NOTE — Progress Notes (Signed)
Primary Care Physician: Shawnee Knapp, MD Referring Physician: Dr. Hulan Saas [name] is a 84 y.o. female with a h/o  HTN, osteoporosis, L breast cancer s/p XRT and chemo and definitive surgery, HLD, and pre diabetes.   Admitted to [facility] 07/27/17 with CP.Hospital course was complicated by acute respiratory failure, bradycardic arrest, Afib RVR, and cardiogenic shock in the setting of anterior MI. Found to have acute anterior MI and takenurgently to thecath lab. [place] 07/27/17 as below with 3v CAD, LVEF 35-40%, elevated LVEDP. Underwent PCI with successful stenting of the ramus intermediate artery with DES x 2. Had bradycardic arrest on arrival to ICU with epi/atropine and brief CPR and intubation. As she improved she was extubated on 2/24. An ECHO was completed an showed LVEF 40-45%.   Diuresed with IV lasix and once euvolemic transitioned to po lasix. HF medications optimized. Renal function was followed closely. She had several episodes of bradycardia so beta blocker was stopped. Later developed A fib RVR so amiodarone was loaded and she was started on eliquis. Chemically converted to NSR. She eventually had amiodarone stopped in July due to intolerance.   She saw her PCP last week and was found to be in afib with RVR. She was set up by HF for cardioversion the next day but had missed several doses of her Doac. Cardioversion was cancelled.   She  is now in the afib clinic for further f/u. She has afib at 106 bpm. She is tolerating well.  She feels slightly winded. Because of  profound bradycardia with BB's in the past, will not add rate control.  F/u in afib clinic 06/05/18. She has now been on anticoagulation since 12/12 without any missed doses. Will schedule for cardioversion. She is still fairly  asymptomatic and weight is stable, actually down a couple of lbs.   F/u in afib clinic. 1/17, she was successful cardioverted. However, she is back in afib with RVR today. She is not aware  when she went back into afib. She does not report any improvement in SR following cardioversion.   Today, she denies symptoms of palpitations, chest pain, shortness of breath, orthopnea, PND, lower extremity edema, dizziness, presyncope, syncope, or neurologic sequela.+ for shortness of breath. The patient is tolerating medications without difficulties and is otherwise without complaint today.   Past Medical History:  Diagnosis Date  . Anxiety   . Breast cancer of lower-outer quadrant of left female breast ([place]) 10/11/2014   treated with lumpectomy and radiation  . Coronary artery disease 07/27/2017  . GERD (gastroesophageal reflux disease)   . Hypertension   . Myocardial infarction ([place]) 07/27/2017   STEMI of anterolateral wall. Cath w/ stent and cabg by Dr. Martinique  . Osteoporosis 10/20/2014  . Personal history of radiation therapy 2016   treatment of breast cancer  . PONV (postoperative nausea and vomiting)   . PUD (peptic ulcer disease)   . Pure hypercholesterolemia 04/08/2014  . Stone, kidney    Past Surgical History:  Procedure Laterality Date  . BREAST LUMPECTOMY Left 2016  . BREAST LUMPECTOMY WITH SENTINEL LYMPH NODE BIOPSY Left 10/22/2014   Procedure: BREAST LUMPECTOMY WITH SENTINEL LYMPH NODE BIOPSY;  Surgeon: Autumn Messing III, MD;  Location: [place];  Service: General;  Laterality: Left;  . CARDIOVERSION N/A 06/13/2018   Procedure: CARDIOVERSION;  Surgeon: Jolaine Artist, MD;  Location: [facility] ENDOSCOPY;  Service: Cardiovascular;  Laterality: N/A;  . CORONARY STENT INTERVENTION N/A 07/27/2017   Procedure:  CORONARY STENT INTERVENTION;  Surgeon: Martinique, Peter M, MD;  Location: [place] CV LAB;  Service: Cardiovascular;  Laterality: N/A;  . CORONARY/GRAFT ACUTE MI REVASCULARIZATION N/A 07/27/2017   Procedure: Coronary/Graft Acute MI Revascularization;  Surgeon: Martinique, Peter M, MD;  Location: [place] CV LAB;  Service: Cardiovascular;  Laterality: N/A;  . LEFT  HEART CATH AND CORONARY ANGIOGRAPHY N/A 07/27/2017   Procedure: LEFT HEART CATH AND CORONARY ANGIOGRAPHY;  Surgeon: Martinique, Peter M, MD;  Location: [place] CV LAB;  Service: Cardiovascular;  Laterality: N/A;  . RE-EXCISION OF BREAST CANCER,SUPERIOR MARGINS Left 11/04/2014   Procedure: RE-EXCISION OF LEFT BREAST INFERIOR MARGINS;  Surgeon: Autumn Messing III, MD;  Location: [place];  Service: General;  Laterality: Left;    Current Outpatient Medications  Medication Sig Dispense Refill  . anastrozole (ARIMIDEX) 1 MG tablet TAKE 1 TABLET BY MOUTH ONCE DAILY (Patient taking differently: Take 1 mg by mouth daily. ) 90 tablet 0  . atorvastatin (LIPITOR) 80 MG tablet TAKE 1 TABLET BY MOUTH ONCE DAILY AT  6  PM (Patient taking differently: Take 80 mg by mouth every evening. ) 90 tablet 2  . Calcium Carb-Cholecalciferol (CALCIUM + D3) 600-800 MG-UNIT TABS Take 1 tablet by mouth every evening.     . Cholecalciferol (VITAMIN D-3) 5000 units TABS Take 5,000 Units by mouth 2 (two) times daily.     . clopidogrel (PLAVIX) 75 MG tablet Take 1 tablet (75 mg total) by mouth daily. 30 tablet 6  . clotrimazole (MYCELEX) 10 MG troche Take 1 tablet (10 mg total) by mouth 5 (five) times daily. Slowly dissolve in mouth, do not chew or swallow whole. Use ALL lozenges 70 tablet 0  . co-enzyme Q-10 30 MG capsule Take 30 mg by mouth 3 (three) times daily.    . Cyanocobalamin (B-12) 2500 MCG SUBL Place 15 drops under the tongue 4 (four) times a week. With b-6 & folic Acid    . ELIQUIS 2.5 MG TABS tablet TAKE 1 TABLET BY MOUTH TWICE DAILY (Patient taking differently: Take 2.5 mg by mouth 2 (two) times daily. Morning & Bedtime.) 60 tablet 5  . Fe Bisgly-Vit C-Vit B12-FA (GENTLE IRON PO) Take 1-2 tablets by mouth daily.    . fish oil-omega-3 fatty acids 1000 MG capsule Take 1 g by mouth at bedtime.     . furosemide (LASIX) 20 MG tablet Take 1 tablet (20 mg total) by mouth as needed. (Patient taking differently: Take 20 mg by mouth  daily as needed (shortness of breath/difficulty breathing.). ) 90 tablet 3  . losartan (COZAAR) 25 MG tablet Take 1 tablet (25 mg total) by mouth 2 (two) times daily. 60 tablet 6  . magnesium oxide (MAG-OX) 400 MG tablet Take 400 mg by mouth 2 (two) times daily.    . Multiple Vitamin (MULTIVITAMIN WITH MINERALS) TABS tablet Take 1 tablet by mouth daily.    Marland Kitchen spironolactone (ALDACTONE) 25 MG tablet TAKE 1 TABLET BY MOUTH ONCE DAILY (Patient taking differently: Take 25 mg by mouth daily. ) 30 tablet 5  . nitroGLYCERIN (NITROSTAT) 0.3 MG SL tablet Place 1 tablet (0.3 mg total) under the tongue every 5 (five) minutes as needed for chest pain. (Patient not taking: Reported on 06/19/2018) 90 tablet 12   No current facility-administered medications for this encounter.     Allergies  Allergen Reactions  . Lisinopril Cough    Social History   Socioeconomic History  . Marital status: Married    Spouse name: Not  on file  . Number of children: Not on file  . Years of education: Not on file  . Highest education level: Not on file  Occupational History  . Occupation: retired  Scientific laboratory technician  . Financial resource strain: Not on file  . Food insecurity:    Worry: Not on file    Inability: Not on file  . Transportation needs:    Medical: Not on file    Non-medical: Not on file  Tobacco Use  . Smoking status: Never Smoker  . Smokeless tobacco: Never Used  Substance and Sexual Activity  . Alcohol use: No  . Drug use: No  . Sexual activity: Not on file  Lifestyle  . Physical activity:    Days per week: Not on file    Minutes per session: Not on file  . Stress: Not on file  Relationships  . Social connections:    Talks on phone: Not on file    Gets together: Not on file    Attends religious service: Not on file    Active member of club or organization: Not on file    Attends meetings of clubs or organizations: Not on file    Relationship status: Not on file  . Intimate partner violence:      Fear of current or ex partner: Not on file    Emotionally abused: Not on file    Physically abused: Not on file    Forced sexual activity: Not on file  Other Topics Concern  . Not on file  Social History Narrative  . Not on file    Family History  Problem Relation Age of Onset  . Pancreatic cancer Mother        pancreastic  . Stroke Father   . Hypertension Brother   . Hypertension Sister   . Hypertension Brother   . Heart disease Brother   . Hypertension Sister     ROS- All systems are reviewed and negative except as per the HPI above  Physical Exam: Vitals:   06/19/18 1103  BP: 118/72  Pulse: (!) 114  Weight: 64 kg  Height: 5' 8.5" (1.74 m)   Wt Readings from Last 3 Encounters:  06/19/18 64 kg  06/13/18 65.3 kg  06/05/18 65.3 kg    Labs: Lab Results  Component Value Date   NA 142 06/13/2018   K 4.4 06/13/2018   CL 109 06/05/2018   CO2 21 (L) 06/05/2018   GLUCOSE 86 06/13/2018   BUN 37 (H) 06/05/2018   CREATININE 1.56 (H) 06/05/2018   CALCIUM 10.4 (H) 06/05/2018   PHOS 4.7 (H) 07/28/2017   MG 2.3 08/02/2017   Lab Results  Component Value Date   INR 1.04 07/27/2017   Lab Results  Component Value Date   CHOL 156 01/10/2018   HDL 74 01/10/2018   LDLCALC 70 01/10/2018   TRIG 61 01/10/2018     GEN- The patient is well appearing, alert and oriented x 3 today.   Head- normocephalic, atraumatic Eyes-  Sclera clear, conjunctiva pink Ears- hearing intact Oropharynx- clear Neck- supple, no JVP Lymph- no cervical lymphadenopathy Lungs- Clear to ausculation bilaterally, normal work of breathing Heart- irregular rate and rhythm, no murmurs, rubs or gallops, PMI not laterally displaced GI- soft, NT, ND, + BS Extremities- no clubbing, cyanosis, or edema MS- no significant deformity or atrophy Skin- no rash or lesion Psych- euthymic mood, full affect Neuro- strength and sensation are intact  EKG-afib at 114 bpm, qrs int 114  ms, qtc 482  ms, IRBBB,  LVH   Assessment and Plan: 1. Persistent Afib Successful  cardioversion but with ERAF Pt feels  afib is intermittent, not aware when she returned to afib Will not start rate control as she has had profound brady in  the past with BB's Intolerant of amiodarone in the past If AAD's are used, looks like Tikosyn is her best bet, would avoid sotalol for possible bradycardia Discussed tikosyn admission with the pt today but she seems overwhelmed with the information Will place one week Zio patch  Will review with pt on return in 2 weeks She was encouraged to bring her son or daughter with her on next appointment to listen to info regarding [place] admission She was given an admission sheet for tikosyn today, explaining process so she could check on the price of dofetilide   F/u with afib clinic in 2 weeks, she has f/u in HF clinic on same day  Butch Penny C. Maddisen Vought, [facility] [address] [place], [place] [address] [phone]

## 2018-06-19 NOTE — Patient Instructions (Addendum)
Take the monitor off on 1/23   Check on the price of dofetilide.

## 2018-06-26 ENCOUNTER — Ambulatory Visit: Payer: Medicare Other | Admitting: Family Medicine

## 2018-07-01 DIAGNOSIS — I4819 Other persistent atrial fibrillation: Secondary | ICD-10-CM | POA: Diagnosis not present

## 2018-07-07 ENCOUNTER — Encounter (HOSPITAL_COMMUNITY): Payer: Self-pay

## 2018-07-07 ENCOUNTER — Ambulatory Visit (HOSPITAL_BASED_OUTPATIENT_CLINIC_OR_DEPARTMENT_OTHER)
Admission: RE | Admit: 2018-07-07 | Discharge: 2018-07-07 | Disposition: A | Discharge status: Home / Self Care | Payer: Medicare Other | Source: Ambulatory Visit | Attending: Internal Medicine | Admitting: Internal Medicine

## 2018-07-07 ENCOUNTER — Encounter (HOSPITAL_COMMUNITY): Payer: Self-pay | Admitting: Nurse Practitioner

## 2018-07-07 ENCOUNTER — Ambulatory Visit (HOSPITAL_COMMUNITY)
Admission: RE | Admit: 2018-07-07 | Discharge: 2018-07-07 | Disposition: A | Discharge status: Home / Self Care | Payer: Medicare Other | Source: Ambulatory Visit | Attending: Nurse Practitioner | Admitting: Nurse Practitioner

## 2018-07-07 VITALS — BP 148/72 | HR 119 | Ht 68.5 in | Wt 140.8 lb

## 2018-07-07 VITALS — BP 148/72 | HR 119 | Wt 140.8 lb

## 2018-07-07 DIAGNOSIS — K219 Gastro-esophageal reflux disease without esophagitis: Secondary | ICD-10-CM | POA: Insufficient documentation

## 2018-07-07 DIAGNOSIS — I739 Peripheral vascular disease, unspecified: Secondary | ICD-10-CM | POA: Insufficient documentation

## 2018-07-07 DIAGNOSIS — I4819 Other persistent atrial fibrillation: Secondary | ICD-10-CM

## 2018-07-07 DIAGNOSIS — Z7902 Long term (current) use of antithrombotics/antiplatelets: Secondary | ICD-10-CM | POA: Insufficient documentation

## 2018-07-07 DIAGNOSIS — N183 Chronic kidney disease, stage 3 unspecified: Secondary | ICD-10-CM

## 2018-07-07 DIAGNOSIS — I130 Hypertensive heart and chronic kidney disease with heart failure and stage 1 through stage 4 chronic kidney disease, or unspecified chronic kidney disease: Secondary | ICD-10-CM | POA: Insufficient documentation

## 2018-07-07 DIAGNOSIS — I5022 Chronic systolic (congestive) heart failure: Secondary | ICD-10-CM | POA: Insufficient documentation

## 2018-07-07 DIAGNOSIS — Z923 Personal history of irradiation: Secondary | ICD-10-CM | POA: Diagnosis not present

## 2018-07-07 DIAGNOSIS — I2510 Atherosclerotic heart disease of native coronary artery without angina pectoris: Secondary | ICD-10-CM | POA: Insufficient documentation

## 2018-07-07 DIAGNOSIS — Z7901 Long term (current) use of anticoagulants: Secondary | ICD-10-CM | POA: Diagnosis not present

## 2018-07-07 DIAGNOSIS — Z853 Personal history of malignant neoplasm of breast: Secondary | ICD-10-CM | POA: Diagnosis not present

## 2018-07-07 DIAGNOSIS — Z8249 Family history of ischemic heart disease and other diseases of the circulatory system: Secondary | ICD-10-CM | POA: Diagnosis not present

## 2018-07-07 DIAGNOSIS — E785 Hyperlipidemia, unspecified: Secondary | ICD-10-CM | POA: Insufficient documentation

## 2018-07-07 DIAGNOSIS — R7303 Prediabetes: Secondary | ICD-10-CM | POA: Diagnosis not present

## 2018-07-07 DIAGNOSIS — Z8711 Personal history of peptic ulcer disease: Secondary | ICD-10-CM | POA: Diagnosis not present

## 2018-07-07 DIAGNOSIS — Z79899 Other long term (current) drug therapy: Secondary | ICD-10-CM | POA: Diagnosis not present

## 2018-07-07 DIAGNOSIS — I480 Paroxysmal atrial fibrillation: Secondary | ICD-10-CM | POA: Insufficient documentation

## 2018-07-07 DIAGNOSIS — Z955 Presence of coronary angioplasty implant and graft: Secondary | ICD-10-CM | POA: Diagnosis not present

## 2018-07-07 DIAGNOSIS — M810 Age-related osteoporosis without current pathological fracture: Secondary | ICD-10-CM | POA: Insufficient documentation

## 2018-07-07 DIAGNOSIS — I252 Old myocardial infarction: Secondary | ICD-10-CM | POA: Insufficient documentation

## 2018-07-07 LAB — BASIC METABOLIC PANEL
Anion gap: 9 (ref 5–15)
BUN: 40 mg/dL — ABNORMAL HIGH (ref 8–23)
CO2: 23 mmol/L (ref 22–32)
Calcium: 11 mg/dL — ABNORMAL HIGH (ref 8.9–10.3)
Chloride: 112 mmol/L — ABNORMAL HIGH (ref 98–111)
Creatinine, Ser: 1.77 mg/dL — ABNORMAL HIGH (ref 0.44–1.00)
GFR calc Af Amer: 31 mL/min — ABNORMAL LOW (ref >60)
GFR calc non Af Amer: 26 mL/min — ABNORMAL LOW (ref >60)
Glucose, Bld: 116 mg/dL — ABNORMAL HIGH (ref 70–99)
POTASSIUM: 4.4 mmol/L (ref 3.5–5.1)
Sodium: 144 mmol/L (ref 135–145)

## 2018-07-07 LAB — CBC
HCT: 33.1 % — ABNORMAL LOW (ref 36.0–46.0)
HEMOGLOBIN: 9.8 g/dL — AB (ref 12.0–15.0)
MCH: 29.1 pg (ref 26.0–34.0)
MCHC: 29.6 g/dL — ABNORMAL LOW (ref 30.0–36.0)
MCV: 98.2 fL (ref 80.0–100.0)
Platelets: 157 10*3/uL (ref 150–400)
RBC: 3.37 MIL/uL — ABNORMAL LOW (ref 3.87–5.11)
RDW: 13.5 % (ref 11.5–15.5)
WBC: 5.3 10*3/uL (ref 4.0–10.5)
nRBC: 0 % (ref 0.0–0.2)

## 2018-07-07 LAB — BRAIN NATRIURETIC PEPTIDE: B Natriuretic Peptide: 625.6 pg/mL — ABNORMAL HIGH (ref 0.0–100.0)

## 2018-07-07 MED ORDER — FUROSEMIDE 20 MG PO TABS: ORAL_TABLET | ORAL | 6 refills | Status: DC

## 2018-07-07 MED ORDER — FUROSEMIDE 20 MG PO TABS: ORAL_TABLET | ORAL | 3 refills | Status: DC

## 2018-07-07 NOTE — Patient Instructions (Signed)
Labs done today. We will contact you with any abnormal lab work.  START Furosemide:Take 20mg  (1 tab) every other day alternating with 40mg  (2 tabs) every other day.  Follow up with the Advanced Practice Providers in 2 weeks

## 2018-07-07 NOTE — Progress Notes (Signed)
Advanced Heart Failure Clinic Note  Primary HF Cardiologist: Dr Sharlotte Alamo  EP: Dr Rayann Heman  HPI: [name] is a 84 y.o. female with h/o HTN, osteoporosis, L breast cancer s/p XRT and chemo and definitive surgery, HLD, and pre diabetes.   Admitted to [facility]  ED 07/27/17 with CP. Hospital course was complicated by acute respiratory failure, bradycardic arrest, Afib RVR, and cardiogenic shock in the setting of anterior MI.  Found to have acute anterior MI and taken urgently to the cath lab. [place] 07/27/17 as below with 3v CAD, LVEF 35-40%, elevated LVEDP. Underwent PCI with successful stenting of the ramus intermediate artery with DES x 2. Had bradycardic arrest on arrival to ICU with epi/atropine and brief CPR and intubation. As she improved she was extubated on 2/24. An ECHO was completed an showed LVEF 40-45%.     Diuresed with IV lasix and once euvolemic transitioned to po lasix.  HF medications optimized. Renal function was followed closely. She had several episodes of bradycardia so beta blocker was stopped. Later developed A fib RVR so amiodarone was loaded and she was started on eliquis. Chemically converted to NSR but had some break through A fib. Plan to continue amio 400 mg twice a day for 7 days then cut back to 200 mg twice a day. Continue eliquis 2.5 mg twice a day ( reduced dose for age and creatinine). Discharge 167 pounds.   On 06/13/18 she underwent cardioversion for recurrent A fib. She returned to the A fib clinic on 06/19/18 and was back in A Fib.   Today she returns for HF follow up. Overall feeling fair. Having increased shortness of breath. Taking lasix 20 mg daily for the last 4-5 days. Denies Orthopnea. Appetite ok. No fever or chills. Weight at home 140-142 pounds. No bleeding issues. Taking all medications but did not take lasix today.    ECHO 12/2017 45-50%  ECHO 07/27/2017 EF 40-45% RVSP 50 mm hg   LHC 07/27/2017   Post Atrio lesion is 90% stenosed.  Prox LAD to Dist  LAD lesion is 50% stenosed.  Ost 1st Diag lesion is 95% stenosed.  Ost 2nd Diag to 2nd Diag lesion is 90% stenosed.  Mid Cx lesion is 30% stenosed.  Ost Ramus to Ramus lesion is 100% stenosed.  A drug-eluting stent was successfully placed using a STENT SYNERGY DES 2.75X28.  A drug-eluting stent was successfully placed using a STENT SYNERGY DES 2.5X12.  Post intervention, there is a 0% residual stenosis.  There is moderate to severe left ventricular systolic dysfunction.  LV end diastolic pressure is moderately elevated.  The left ventricular ejection fraction is 35-45% by visual estimate. 1. 3 vessel obstructive CAD - 100% ramus intermediate artery. This is a large vessel and is the culprit lesion. - 95% small first diagonal - 90% small second diagonal  - diffusely diseased LAD - 90% PLOM 2. Moderate to severe LV dysfunction. EF estimated at 35-40%. 3. Elevated LVEDP 4. Successful stenting of the ramus intermediate artery with DES x 2.  Plan; DAPT for one year  Review of systems complete and found to be negative unless listed in HPI.    SH:  Social History   Socioeconomic History  . Marital status: Married    Spouse name: Not on file  . Number of children: Not on file  . Years of education: Not on file  . Highest education level: Not on file  Occupational History  . Occupation: retired  Scientific laboratory technician  .  Financial resource strain: Not on file  . Food insecurity:    Worry: Not on file    Inability: Not on file  . Transportation needs:    Medical: Not on file    Non-medical: Not on file  Tobacco Use  . Smoking status: Never Smoker  . Smokeless tobacco: Never Used  Substance and Sexual Activity  . Alcohol use: No  . Drug use: No  . Sexual activity: Not on file  Lifestyle  . Physical activity:    Days per week: Not on file    Minutes per session: Not on file  . Stress: Not on file  Relationships  . Social connections:    Talks on phone:  Not on file    Gets together: Not on file    Attends religious service: Not on file    Active member of club or organization: Not on file    Attends meetings of clubs or organizations: Not on file    Relationship status: Not on file  . Intimate partner violence:    Fear of current or ex partner: Not on file    Emotionally abused: Not on file    Physically abused: Not on file    Forced sexual activity: Not on file  Other Topics Concern  . Not on file  Social History Narrative  . Not on file    FH:  Family History  Problem Relation Age of Onset  . Pancreatic cancer Mother        pancreastic  . Stroke Father   . Hypertension Brother   . Hypertension Sister   . Hypertension Brother   . Heart disease Brother   . Hypertension Sister     Past Medical History:  Diagnosis Date  . Anxiety   . Breast cancer of lower-outer quadrant of left female breast ([place]) 10/11/2014   treated with lumpectomy and radiation  . Coronary artery disease 07/27/2017  . GERD (gastroesophageal reflux disease)   . Hypertension   . Myocardial infarction ([place]) 07/27/2017   STEMI of anterolateral wall. Cath w/ stent and cabg by Dr. Martinique  . Osteoporosis 10/20/2014  . Personal history of radiation therapy 2016   treatment of breast cancer  . PONV (postoperative nausea and vomiting)   . PUD (peptic ulcer disease)   . Pure hypercholesterolemia 04/08/2014  . Stone, kidney     Current Outpatient Medications  Medication Sig Dispense Refill  . anastrozole (ARIMIDEX) 1 MG tablet TAKE 1 TABLET BY MOUTH ONCE DAILY (Patient taking differently: Take 1 mg by mouth daily. ) 90 tablet 0  . atorvastatin (LIPITOR) 80 MG tablet TAKE 1 TABLET BY MOUTH ONCE DAILY AT  6  PM (Patient taking differently: Take 80 mg by mouth every evening. ) 90 tablet 2  . Calcium Carb-Cholecalciferol (CALCIUM + D3) 600-800 MG-UNIT TABS Take 1 tablet by mouth every evening.     . Cholecalciferol (VITAMIN D-3) 5000 units TABS Take 5,000 Units  by mouth 2 (two) times daily.     . clopidogrel (PLAVIX) 75 MG tablet Take 1 tablet (75 mg total) by mouth daily. 30 tablet 6  . clotrimazole (MYCELEX) 10 MG troche Take 1 tablet (10 mg total) by mouth 5 (five) times daily. Slowly dissolve in mouth, do not chew or swallow whole. Use ALL lozenges 70 tablet 0  . co-enzyme Q-10 30 MG capsule Take 30 mg by mouth 3 (three) times daily.    . Cyanocobalamin (B-12) 2500 MCG SUBL Place 15 drops under the  tongue 4 (four) times a week. With b-6 & folic Acid    . ELIQUIS 2.5 MG TABS tablet TAKE 1 TABLET BY MOUTH TWICE DAILY (Patient taking differently: Take 2.5 mg by mouth 2 (two) times daily. Morning & Bedtime.) 60 tablet 5  . Fe Bisgly-Vit C-Vit B12-FA (GENTLE IRON PO) Take 1-2 tablets by mouth daily.    . fish oil-omega-3 fatty acids 1000 MG capsule Take 1 g by mouth at bedtime.     . furosemide (LASIX) 20 MG tablet Take 1 tablet (20 mg total) by mouth as needed. (Patient taking differently: Take 20 mg by mouth daily as needed (shortness of breath/difficulty breathing.). ) 90 tablet 3  . losartan (COZAAR) 25 MG tablet Take 1 tablet (25 mg total) by mouth 2 (two) times daily. 60 tablet 6  . magnesium oxide (MAG-OX) 400 MG tablet Take 400 mg by mouth 2 (two) times daily.    . Multiple Vitamin (MULTIVITAMIN WITH MINERALS) TABS tablet Take 1 tablet by mouth daily.    . nitroGLYCERIN (NITROSTAT) 0.3 MG SL tablet Place 1 tablet (0.3 mg total) under the tongue every 5 (five) minutes as needed for chest pain. 90 tablet 12  . spironolactone (ALDACTONE) 25 MG tablet TAKE 1 TABLET BY MOUTH ONCE DAILY (Patient taking differently: Take 25 mg by mouth daily. ) 30 tablet 5   No current facility-administered medications for this encounter.    Vitals:   07/07/18 1406  BP: (!) 148/72  Pulse: (!) 119  Weight: 63.9 kg (140 lb 12.8 oz)   ReDS Vest - 07/07/18 1400      ReDS Vest   MR   Moderate    Fitting Posture  Sitting    Height Marker  Tall   station C    Ruler  Value  --   28.5   Center Strip  Aligned    ReDS Value  41         Wt Readings from Last 3 Encounters:  07/07/18 63.9 kg (140 lb 12.8 oz)  07/07/18 63.9 kg (140 lb 12.8 oz)  06/19/18 64 kg (141 lb)    PHYSICAL EXAM:  General:  . No resp difficulty HEENT: normal Neck: supple. JPV 9-10 . Carotids 2+ bilat; no bruits. No lymphadenopathy or thryomegaly appreciated. Cor: PMI nondisplaced. Irregular rate & rhythm. No rubs, gallops or murmurs. Lungs: clear Abdomen: soft, nontender, nondistended. No hepatosplenomegaly. No bruits or masses. Good bowel sounds. Extremities: no cyanosis, clubbing, rash, R and LLE 1+ edema Neuro: alert & orientedx3, cranial nerves grossly intact. moves all 4 extremities w/o difficulty. Affect pleasant   ASSESSMENT & PLAN: 1. CAD - 3V CAD with STEMI 07/2017 S/P DES x2  -No s/s ischemia  - Continue plavix, eliquis, and high dose statin. Off ASA - Continue carvedilol 3.125 bid  2. Chronic Systolic Heart Failure, ICM- ECHO EF 07/2017 EF 40-45%  Echo today EF 55-60%  (Personally reviewed)  Reds reading 41%.  - Volume overloaded in the setting of A fib.  - Increase lasix to 40 mg one day then alternate with 20 mg then next.  - Continue losartan 25 mg twice a day.  - Continue 25 mg spironolactone daily  - Continue coreg 3.125 bid Check BMET    3. PAF S/P DC-CV 06/13/2018 to NSR but back in A fib on 1/16  - Remains in A fib. She was intolerant amio due to nausea and tremors.  - Dr Rayann Heman and Butch Penny are considering Tikosyn.  -- Continue eliquis 2.5  mg BID.   4. HTN - Meds as above.   5. H.O Left Breast Cancer - No change to current plan.    6. CKD III - Creatinine 1.5-1.8.  -Check BMEt   7. Intermittent claudication - Check ABIs    Follow up 2 weeks to assess volume status and Reds Clip.  Greater than 50% of the (total minutes 25) visit spent in counseling/coordination of care regarding medication changes and lab work.   Darrick Grinder, NP  1:53 PM

## 2018-07-07 NOTE — Progress Notes (Signed)
ReDS Vest - 07/07/18 1400      ReDS Vest   MR   Moderate    Fitting Posture  Sitting    Height Marker  Tall   station C    Ruler Value  --   28.5   Center Strip  Aligned    ReDS Value  41

## 2018-07-07 NOTE — Progress Notes (Addendum)
Primary Care Physician: [name] Referring Physician: Dr. Hulan [name] ROSALIA [name] is a 84 y.o. female with a h/o  HTN, osteoporosis, L breast cancer s/p XRT and chemo and definitive surgery, HLD, and pre diabetes.   Admitted to [facility] 07/27/17 with CP.Hospital course was complicated by acute respiratory failure, bradycardic arrest, Afib RVR, and cardiogenic shock in the setting of anterior MI. Found to have acute anterior MI and takenurgently to thecath lab. [place] 07/27/17 as below with 3v CAD, LVEF 35-40%, elevated LVEDP. Underwent PCI with successful stenting of the ramus intermediate artery with DES x 2. Had bradycardic arrest on arrival to ICU with epi/atropine and brief CPR and intubation. As she improved she was extubated on 2/24. An ECHO was completed an showed LVEF 40-45%.   Diuresed with IV lasix and once euvolemic transitioned to po lasix. HF medications optimized. Renal function was followed closely. She had several episodes of bradycardia so beta blocker was stopped. Later developed A fib RVR so amiodarone was loaded and she was started on eliquis. Chemically converted to NSR. She eventually had amiodarone stopped in May due to tremors to the point it was difficult to feed herself and GI symptoms of anorexia and nausea.   She saw her PCP last week and was found to be in afib with RVR. She was set up by HF for cardioversion the next day but had missed several doses of her Doac. Cardioversion was cancelled.   She  was seen initially in  the afib clinic, 12/17 . She has afib at 106 bpm. She was tolerating well.  She felt slightly winded. Because of  profound bradycardia with BB's in the past, did not add rate control.  F/u in afib clinic 06/05/18. She had then been on anticoagulation since 05/15/18 without any missed doses. Scheduled for cardioversion. She was still fairly asymptomatic and weight was stable, actually down a couple of lbs.   F/u in afib clinic. 06/20/17, she had  successful cardioverion. However, she had ERAF. She was not aware when she went back into afib. She does not report feeling any better in SR following cardioversion. On last visit, I discussed antiarrythmic's with pt but she was by herself and did not fully comprehend .   F/u in afib clinic, 07/07/18. She remains in afib with RVR. She returns with her husband and her son to discuss antiarrythmic's. She did check on price of dofetilide and it was quoted at $133 dollars a month. She feels that price would be prohibitive. Her son wanted to re challenge amiodarone but the pt  less so and I feel that the same symptoms would return as she did ok on amiodarone for the first few months. On last visit, pt did not seem to think she was in afib persistently. She did wear a monitor for one week and she it did confirm  afib persistently.  Today, she denies symptoms of palpitations, chest pain,  orthopnea, PND, lower extremity edema,   presyncope, syncope, or neurologic sequela.+ for shortness of breath/fatigue/. Weight is stable. The patient is tolerating medications without difficulties and is otherwise without complaint today.   Past Medical History:  Diagnosis Date  . Anxiety   . Breast cancer of lower-outer quadrant of left female breast ([place]) 10/11/2014   treated with lumpectomy and radiation  . Coronary artery disease 07/27/2017  . GERD (gastroesophageal reflux disease)   . Hypertension   . Myocardial infarction ([place]) 07/27/2017   STEMI of anterolateral  wall. Cath w/ stent and cabg by Dr. Martinique  . Osteoporosis 10/20/2014  . Personal history of radiation therapy 2016   treatment of breast cancer  . PONV (postoperative nausea and vomiting)   . PUD (peptic ulcer disease)   . Pure hypercholesterolemia 04/08/2014  . Stone, kidney    Past Surgical History:  Procedure Laterality Date  . BREAST LUMPECTOMY Left 2016  . BREAST LUMPECTOMY WITH SENTINEL LYMPH NODE BIOPSY Left 10/22/2014   Procedure: BREAST  LUMPECTOMY WITH SENTINEL LYMPH NODE BIOPSY;  Surgeon: [name];  Location: [facility];  Service: General;  Laterality: Left;  . CARDIOVERSION N/A 06/13/2018   Procedure: CARDIOVERSION;  Surgeon: [name];  Location: [facility] ENDOSCOPY;  Service: Cardiovascular;  Laterality: N/A;  . CORONARY STENT INTERVENTION N/A 07/27/2017   Procedure: CORONARY STENT INTERVENTION;  Surgeon: [name];  Location: [facility] CV LAB;  Service: Cardiovascular;  Laterality: N/A;  . CORONARY/GRAFT ACUTE MI REVASCULARIZATION N/A 07/27/2017   Procedure: Coronary/Graft Acute MI Revascularization;  Surgeon: [name];  Location: [facility] CV LAB;  Service: Cardiovascular;  Laterality: N/A;  . LEFT HEART CATH AND CORONARY ANGIOGRAPHY N/A 07/27/2017   Procedure: LEFT HEART CATH AND CORONARY ANGIOGRAPHY;  Surgeon: [name];  Location: [facility] CV LAB;  Service: Cardiovascular;  Laterality: N/A;  . RE-EXCISION OF BREAST CANCER,SUPERIOR MARGINS Left 11/04/2014   Procedure: RE-EXCISION OF LEFT BREAST INFERIOR MARGINS;  Surgeon: [name];  Location: [facility];  Service: General;  Laterality: Left;    Current Outpatient Medications  Medication Sig Dispense Refill  . anastrozole (ARIMIDEX) 1 MG tablet TAKE 1 TABLET BY MOUTH ONCE DAILY (Patient taking differently: Take 1 mg by mouth daily. ) 90 tablet 0  . atorvastatin (LIPITOR) 80 MG tablet TAKE 1 TABLET BY MOUTH ONCE DAILY AT  6  PM (Patient taking differently: Take 80 mg by mouth every evening. ) 90 tablet 2  . Calcium Carb-Cholecalciferol (CALCIUM + D3) 600-800 MG-UNIT TABS Take 1 tablet by mouth every evening.     . Cholecalciferol (VITAMIN D-3) 5000 units TABS Take 5,000 Units by mouth 2 (two) times daily.     . clopidogrel (PLAVIX) 75 MG tablet Take 1 tablet (75 mg total) by mouth daily. 30 tablet 6  . clotrimazole (MYCELEX) 10 MG troche Take 1 tablet (10 mg total) by mouth 5 (five) times daily. Slowly  dissolve in mouth, do not chew or swallow whole. Use ALL lozenges 70 tablet 0  . co-enzyme Q-10 30 MG capsule Take 30 mg by mouth 3 (three) times daily.    . Cyanocobalamin (B-12) 2500 MCG SUBL Place 15 drops under the tongue 4 (four) times a week. With b-6 & folic Acid    . ELIQUIS 2.5 MG TABS tablet TAKE 1 TABLET BY MOUTH TWICE DAILY (Patient taking differently: Take 2.5 mg by mouth 2 (two) times daily. Morning & Bedtime.) 60 tablet 5  . Fe Bisgly-Vit C-Vit B12-FA (GENTLE IRON PO) Take 1-2 tablets by mouth daily.    . fish oil-omega-3 fatty acids 1000 MG capsule Take 1 g by mouth at bedtime.     Marland Kitchen losartan (COZAAR) 25 MG tablet Take 1 tablet (25 mg total) by mouth 2 (two) times daily. 60 tablet 6  . magnesium oxide (MAG-OX) 400 MG tablet Take 400 mg by mouth 2 (two) times daily.    . Multiple Vitamin (MULTIVITAMIN WITH MINERALS) TABS tablet Take 1 tablet by mouth daily.    Marland Kitchen  nitroGLYCERIN (NITROSTAT) 0.3 MG SL tablet Place 1 tablet (0.3 mg total) under the tongue every 5 (five) minutes as needed for chest pain. 90 tablet 12  . spironolactone (ALDACTONE) 25 MG tablet TAKE 1 TABLET BY MOUTH ONCE DAILY (Patient taking differently: Take 25 mg by mouth daily. ) 30 tablet 5  . furosemide (LASIX) 20 MG tablet Take 20mg  (1 tab) every other day alternating with 40mg  (2 tabs) every other day. 45 tablet 6   No current facility-administered medications for this encounter.     Allergies  Allergen Reactions  . Lisinopril Cough    Social History   Socioeconomic History  . Marital status: Married    Spouse name: Not on file  . Number of children: Not on file  . Years of education: Not on file  . Highest education level: Not on file  Occupational History  . Occupation: retired  Scientific laboratory technician  . Financial resource strain: Not on file  . Food insecurity:    Worry: Not on file    Inability: Not on file  . Transportation needs:    Medical: Not on file    Non-medical: Not on file  Tobacco Use  .  Smoking status: Never Smoker  . Smokeless tobacco: Never Used  Substance and Sexual Activity  . Alcohol use: No  . Drug use: No  . Sexual activity: Not on file  Lifestyle  . Physical activity:    Days per week: Not on file    Minutes per session: Not on file  . Stress: Not on file  Relationships  . Social connections:    Talks on phone: Not on file    Gets together: Not on file    Attends religious service: Not on file    Active member of club or organization: Not on file    Attends meetings of clubs or organizations: Not on file    Relationship status: Not on file  . Intimate partner violence:    Fear of current or ex partner: Not on file    Emotionally abused: Not on file    Physically abused: Not on file    Forced sexual activity: Not on file  Other Topics Concern  . Not on file  Social History Narrative  . Not on file    Family History  Problem Relation Age of Onset  . Pancreatic cancer Mother        pancreastic  . Stroke Father   . Hypertension Brother   . Hypertension Sister   . Hypertension Brother   . Heart disease Brother   . Hypertension Sister     ROS- All systems are reviewed and negative except as per the HPI above  Physical Exam: Vitals:   07/07/18 1340  BP: (!) 148/72  Pulse: (!) 119  Weight: 63.9 kg  Height: 5' 8.5" (1.74 [name])   Wt Readings from Last 3 Encounters:  07/07/18 63.9 kg  07/07/18 63.9 kg  06/19/18 64 kg    Labs: Lab Results  Component Value Date   NA 142 06/13/2018   K 4.4 06/13/2018   CL 109 06/05/2018   CO2 21 (L) 06/05/2018   GLUCOSE 86 06/13/2018   BUN 37 (H) 06/05/2018   CREATININE 1.56 (H) 06/05/2018   CALCIUM 10.4 (H) 06/05/2018   PHOS 4.7 (H) 07/28/2017   MG 2.3 08/02/2017   Lab Results  Component Value Date   INR 1.04 07/27/2017   Lab Results  Component Value Date   CHOL 156 01/10/2018  HDL 74 01/10/2018   LDLCALC 70 01/10/2018   TRIG 61 01/10/2018     GEN- The patient is well appearing, alert and  oriented x 3 today.   Head- normocephalic, atraumatic Eyes-  Sclera clear, conjunctiva pink Ears- hearing intact Oropharynx- clear Neck- supple, no JVP Lymph- no cervical lymphadenopathy Lungs- Clear to ausculation bilaterally, normal work of breathing Heart- irregular rate and rhythm, no murmurs, rubs or gallops, PMI not laterally displaced GI- soft, NT, ND, + BS Extremities- no clubbing, cyanosis, or edema MS- no significant deformity or atrophy Skin- no rash or lesion Psych- euthymic mood, full affect Neuro- strength and sensation are intact  EKG-afib at 119 bpm, qrs int 116 ms, qtc 509  ms, IRBBB, LVH   See EKG with brady at 49 bpm 08/15/17 hospitalization   Zio  patch-Atrial Fibrillation occurred continuously (100% burden), ranging from 64- 164 bpm (avg of 105 bpm). Isolated VEs were occasional (1.0%, 10733), VE Couplets were rare (<1.0%, 986), and VE Triplets were rare (<1.0%, 1). Ventricular Bigeminy and Trigeminy were present.  ECHO- 12/2017-Left ventricle: The cavity size was normal. Wall thickness was-  increased in a pattern of moderate LVH. There was severe focal   basal hypertrophy of the septum. Systolic function was mildly   reduced. The estimated ejection fraction was in the range of 45%   to 50%. Wall motion was normal; there were no regional wall   motion abnormalities. The study is not technically sufficient to   allow evaluation of LV diastolic function. - Aortic valve: Trileaflet. Sclerosis without stenosis. There was   trivial regurgitation. - Mitral valve: Mildly thickened leaflets with late systolic   prolapse of the anterior leaflet. There was moderate   regurgitation, posteriorly directed. - Left atrium: Severely dilated. - Tricuspid valve: There was moderate regurgitation. - Pulmonary arteries: PA peak pressure: 50 mm Hg (S). - Inferior vena cava: The vessel was dilated. The respirophasic   diameter changes were blunted (< 50%), consistent with  elevated   central venous pressure.  Impressions:  - Compared to a prior study in 07/2017, the LVEF has improved to   45-50%. There is moderate pulmonary hypertension with an RVSP of   50 mmHg.  Assessment and Plan: 1. Persistent Afib Successful  Cardioversion 1/10 but with ERAF Pt felt  afib was intermittent, not aware when she returned to afib Recent ZIO patch did show persistent afib( slowest 64 bpm, average 105 bpm) She did not tolerate amiodarone in the past with tremors and GI issues several months after being on drug(stopped end of May 2019) I thought previously about Tikosyn but cost prohibitive and Qtc looks on the  long side for Tikosyn, confirmed with Dr. Rayann Heman, plus crcl cal is 24 so she would only be able to take 125 mcg bid of Tikosyn Sotalol is contraindicated with crcl less than 40, plus have the bradycardia history 1c's are out with CAD, reduced EF BB's had to be stopped last spring after Acute MI  for profound bradycardia  She has been taking extra lasix this past week for shortness of breath but weight is stable and I feel the afib is causing the shortness of breath, she has felt more dizzy this week Reminded not to take extra lasix unless she has weight gain to prevent dehydration  She is being seen in HF clinic right after this visit  and labs will be drawn  there I feel it might be best to get her an appointment with Dr. Lovena Le  to discuss AV nodal ablation/ppm or try PPM with rate control drugs.  Son made aware that [name] will call with an appointment   [name]. [name], [facility] [address] [place], [place] [address] [phone]

## 2018-07-11 ENCOUNTER — Inpatient Hospital Stay: Payer: Medicare Other | Attending: Oncology

## 2018-07-11 VITALS — BP 125/74 | HR 99 | Temp 97.6°F | Resp 18

## 2018-07-11 DIAGNOSIS — M818 Other osteoporosis without current pathological fracture: Secondary | ICD-10-CM

## 2018-07-11 DIAGNOSIS — M810 Age-related osteoporosis without current pathological fracture: Secondary | ICD-10-CM | POA: Insufficient documentation

## 2018-07-11 MED ORDER — DENOSUMAB 60 MG/ML ~~LOC~~ SOSY
60.0000 mg | PREFILLED_SYRINGE | Freq: Once | SUBCUTANEOUS | Status: AC
  Administered 2018-07-11: 60 mg via SUBCUTANEOUS

## 2018-07-15 ENCOUNTER — Encounter: Payer: Self-pay | Admitting: Internal Medicine

## 2018-07-15 ENCOUNTER — Ambulatory Visit (INDEPENDENT_AMBULATORY_CARE_PROVIDER_SITE_OTHER): Payer: Medicare Other | Admitting: Internal Medicine

## 2018-07-15 VITALS — BP 124/72 | HR 99 | Ht 68.5 in | Wt 139.0 lb

## 2018-07-15 DIAGNOSIS — I4819 Other persistent atrial fibrillation: Secondary | ICD-10-CM

## 2018-07-15 NOTE — Patient Instructions (Addendum)
Medication Instructions:  Your physician recommends that you continue on your current medications as directed. Please refer to the Current Medication list given to you today.  Labwork: None ordered.  Testing/Procedures: Your physician has recommended that you have a pacemaker inserted. A pacemaker is a small device that is placed under the skin of your chest or abdomen to help control abnormal heart rhythms. This device uses electrical pulses to prompt the heart to beat at a normal rate. Pacemakers are used to treat heart rhythms that are too slow. Wire (leads) are attached to the pacemaker that goes into the chambers of you heart. This is done in the hospital and usually requires and overnight stay. Please see the instruction sheet given to you today for more information.   The following days are available for procedures:  February 13, 17, 20, 24, 26 March 2, 6, 16, 20, 23, 25, 27, 30  If you decide on a day please give me a call:  Bing Neighbors [phone]   Pacemaker Implantation, Adult Pacemaker implantation is a procedure to place a pacemaker inside your chest. A pacemaker is a small computer that sends electrical signals to the heart and helps your heart beat normally. A pacemaker also stores information about your heart rhythms. You may need pacemaker implantation if you:  Have a slow heartbeat (bradycardia).  Faint (syncope).  Have shortness of breath (dyspnea) due to heart problems. The pacemaker attaches to your heart through a wire, called a lead. Sometimes just one lead is needed. Other times, there will be two leads. There are two types of pacemakers:  Transvenous pacemaker. This type is placed under the skin or muscle of your chest. The lead goes through a vein in the chest area to reach the inside of the heart.  Epicardial pacemaker. This type is placed under the skin or muscle of your chest or belly. The lead goes through your chest to the outside of the heart. Tell a  health care provider about:  Any allergies you have.  All medicines you are taking, including vitamins, herbs, eye drops, creams, and over-the-counter medicines.  Any problems you or family members have had with anesthetic medicines.  Any blood or bone disorders you have.  Any surgeries you have had.  Any medical conditions you have.  Whether you are pregnant or may be pregnant. What are the risks? Generally, this is a safe procedure. However, problems may occur, including:  Infection.  Bleeding.  Failure of the pacemaker or the lead.  Collapse of a lung or bleeding into a lung.  Blood clot inside a blood vessel with a lead.  Damage to the heart.  Infection inside the heart (endocarditis).  Allergic reactions to medicines. What happens before the procedure? Staying hydrated Follow instructions from your health care provider about hydration, which may include:  Up to 2 hours before the procedure - you may continue to drink clear liquids, such as water, clear fruit juice, black coffee, and plain tea. Eating and drinking restrictions Follow instructions from your health care provider about eating and drinking, which may include:  8 hours before the procedure - stop eating heavy meals or foods such as meat, fried foods, or fatty foods.  6 hours before the procedure - stop eating light meals or foods, such as toast or cereal.  6 hours before the procedure - stop drinking milk or drinks that contain milk.  2 hours before the procedure - stop drinking clear liquids. Medicines  Ask your health care  provider about: ? Changing or stopping your regular medicines. This is especially important if you are taking diabetes medicines or blood thinners. ? Taking medicines such as aspirin and ibuprofen. These medicines can thin your blood. Do not take these medicines before your procedure if your health care provider instructs you not to.  You may be given antibiotic medicine to  help prevent infection. General instructions  You will have a heart evaluation. This may include an electrocardiogram (ECG), chest X-ray, and heart imaging (echocardiogram,  or echo) tests.  You will have blood tests.  Do not use any products that contain nicotine or tobacco, such as cigarettes and e-cigarettes. If you need help quitting, ask your health care provider.  Plan to have someone take you home from the hospital or clinic.  If you will be going home right after the procedure, plan to have someone with you for 24 hours.  Ask your health care provider how your surgical site will be marked or identified. What happens during the procedure?  To reduce your risk of infection: ? Your health care team will wash or sanitize their hands. ? Your skin will be washed with soap. ? Hair may be removed from the surgical area.  An IV tube will be inserted into one of your veins.  You will be given one or more of the following: ? A medicine to help you relax (sedative). ? A medicine to numb the area (local anesthetic). ? A medicine to make you fall asleep (general anesthetic).  If you are getting a transvenous pacemaker: ? An incision will be made in your upper chest. ? A pocket will be made for the pacemaker. It may be placed under the skin or between layers of muscle. ? The lead will be inserted into a blood vessel that returns to the heart. ? While X-rays are taken by an imaging machine (fluoroscopy), the lead will be advanced through the vein to the inside of your heart. ? The other end of the lead will be tunneled under the skin and attached to the pacemaker.  If you are getting an epicardial pacemaker: ? An incision will be made near your ribs or breastbone (sternum) for the lead. ? The lead will be attached to the outside of your heart. ? Another incision will be made in your chest or upper belly to create a pocket for the pacemaker. ? The free end of the lead will be tunneled  under the skin and attached to the pacemaker.  The transvenous or epicardial pacemaker will be tested. Imaging studies may be done to check the lead position.  The incisions will be closed with stitches (sutures), adhesive strips, or skin glue.  Bandages (dressing) will be placed over the incisions. The procedure may vary among health care providers and hospitals. What happens after the procedure?  Your blood pressure, heart rate, breathing rate, and blood oxygen level will be monitored until the medicines you were given have worn off.  You will be given antibiotics and pain medicine.  ECG and chest x-rays will be done.  You will wear a continuous type of ECG (Holter monitor) to check your heart rhythm.  Your health care provider will program the pacemaker.  Do not drive for 24 hours if you received a sedative. This information is not intended to replace advice given to you by your health care provider. Make sure you discuss any questions you have with your health care provider. Document Released: 05/11/2002 Document Revised: 02/07/2018 Document  Reviewed: 11/02/2015 Elsevier Interactive Patient Education  [employer].

## 2018-07-15 NOTE — Progress Notes (Signed)
HPI [name] is referred today by Roderic Palau for evaluation of uncontrolled atrial fib. She is a pleasant elderly woman with CAD, s/p MI, h/o atrial fib who has had both a RVR and a very slow VR. She was treated with amiodarone but developed problems with appetite, and nausea. The patient has renal dysfunction and would only tolerated low dose dofetilide and her QTC was a little long. The patient has had RVR but had a slow VR on beta blockers previously. She has mild LV dysfunction by echo, EF 40-45%. She denies syncope.  Allergies  Allergen Reactions  . Lisinopril Cough     Current Outpatient Medications  Medication Sig Dispense Refill  . anastrozole (ARIMIDEX) 1 MG tablet TAKE 1 TABLET BY MOUTH ONCE DAILY 90 tablet 0  . atorvastatin (LIPITOR) 80 MG tablet TAKE 1 TABLET BY MOUTH ONCE DAILY AT  6  PM 90 tablet 2  . Calcium Carb-Cholecalciferol (CALCIUM + D3) 600-800 MG-UNIT TABS Take 1 tablet by mouth every evening.     . Cholecalciferol (VITAMIN D-3) 5000 units TABS Take 5,000 Units by mouth 2 (two) times daily.     . clopidogrel (PLAVIX) 75 MG tablet Take 1 tablet (75 mg total) by mouth daily. 30 tablet 6  . clotrimazole (MYCELEX) 10 MG troche Take 1 tablet (10 mg total) by mouth 5 (five) times daily. Slowly dissolve in mouth, do not chew or swallow whole. Use ALL lozenges 70 tablet 0  . co-enzyme Q-10 30 MG capsule Take 30 mg by mouth 3 (three) times daily.    . Cyanocobalamin (B-12) 2500 MCG SUBL Place 15 drops under the tongue 4 (four) times a week. With b-6 & folic Acid    . ELIQUIS 2.5 MG TABS tablet TAKE 1 TABLET BY MOUTH TWICE DAILY 60 tablet 5  . Fe Bisgly-Vit C-Vit B12-FA (GENTLE IRON PO) Take 1-2 tablets by mouth daily.    . fish oil-omega-3 fatty acids 1000 MG capsule Take 1 g by mouth at bedtime.     Marland Kitchen losartan (COZAAR) 25 MG tablet Take 1 tablet (25 mg total) by mouth 2 (two) times daily. 60 tablet 6  . magnesium oxide (MAG-OX) 400 MG tablet Take 400 mg by mouth  2 (two) times daily.    . Multiple Vitamin (MULTIVITAMIN WITH MINERALS) TABS tablet Take 1 tablet by mouth daily.    Marland Kitchen spironolactone (ALDACTONE) 25 MG tablet TAKE 1 TABLET BY MOUTH ONCE DAILY 30 tablet 5  . furosemide (LASIX) 20 MG tablet Take 20 mg by mouth as needed for fluid.    . nitroGLYCERIN (NITROSTAT) 0.3 MG SL tablet Place 1 tablet (0.3 mg total) under the tongue every 5 (five) minutes as needed for chest pain. (Patient not taking: Reported on 07/15/2018) 90 tablet 12   No current facility-administered medications for this visit.      Past Medical History:  Diagnosis Date  . Anxiety   . Breast cancer of lower-outer quadrant of left female breast ([place]) 10/11/2014   treated with lumpectomy and radiation  . Coronary artery disease 07/27/2017  . GERD (gastroesophageal reflux disease)   . Hypertension   . Myocardial infarction ([place]) 07/27/2017   STEMI of anterolateral wall. Cath w/ stent and cabg by Dr. Martinique  . Osteoporosis 10/20/2014  . Personal history of radiation therapy 2016   treatment of breast cancer  . PONV (postoperative nausea and vomiting)   . PUD (peptic ulcer disease)   . Pure hypercholesterolemia 04/08/2014  .  Stone, kidney     ROS:   All systems reviewed and negative except as noted in the HPI.   Past Surgical History:  Procedure Laterality Date  . BREAST LUMPECTOMY Left 2016  . BREAST LUMPECTOMY WITH SENTINEL LYMPH NODE BIOPSY Left 10/22/2014   Procedure: BREAST LUMPECTOMY WITH SENTINEL LYMPH NODE BIOPSY;  Surgeon: Autumn Messing III, MD;  Location: [place];  Service: General;  Laterality: Left;  . CARDIOVERSION N/A 06/13/2018   Procedure: CARDIOVERSION;  Surgeon: Jolaine Artist, MD;  Location: [facility] ENDOSCOPY;  Service: Cardiovascular;  Laterality: N/A;  . CORONARY STENT INTERVENTION N/A 07/27/2017   Procedure: CORONARY STENT INTERVENTION;  Surgeon: Martinique, Peter M, MD;  Location: [place] CV LAB;  Service: Cardiovascular;  Laterality: N/A;   . CORONARY/GRAFT ACUTE MI REVASCULARIZATION N/A 07/27/2017   Procedure: Coronary/Graft Acute MI Revascularization;  Surgeon: Martinique, Peter M, MD;  Location: [place] CV LAB;  Service: Cardiovascular;  Laterality: N/A;  . LEFT HEART CATH AND CORONARY ANGIOGRAPHY N/A 07/27/2017   Procedure: LEFT HEART CATH AND CORONARY ANGIOGRAPHY;  Surgeon: Martinique, Peter M, MD;  Location: [place] CV LAB;  Service: Cardiovascular;  Laterality: N/A;  . RE-EXCISION OF BREAST CANCER,SUPERIOR MARGINS Left 11/04/2014   Procedure: RE-EXCISION OF LEFT BREAST INFERIOR MARGINS;  Surgeon: Autumn Messing III, MD;  Location: [place];  Service: General;  Laterality: Left;     Family History  Problem Relation Age of Onset  . Pancreatic cancer Mother        pancreastic  . Stroke Father   . Hypertension Brother   . Hypertension Sister   . Hypertension Brother   . Heart disease Brother   . Hypertension Sister      Social History   Socioeconomic History  . Marital status: Married    Spouse name: Not on file  . Number of children: Not on file  . Years of education: Not on file  . Highest education level: Not on file  Occupational History  . Occupation: retired  Scientific laboratory technician  . Financial resource strain: Not on file  . Food insecurity:    Worry: Not on file    Inability: Not on file  . Transportation needs:    Medical: Not on file    Non-medical: Not on file  Tobacco Use  . Smoking status: Never Smoker  . Smokeless tobacco: Never Used  Substance and Sexual Activity  . Alcohol use: No  . Drug use: No  . Sexual activity: Not on file  Lifestyle  . Physical activity:    Days per week: Not on file    Minutes per session: Not on file  . Stress: Not on file  Relationships  . Social connections:    Talks on phone: Not on file    Gets together: Not on file    Attends religious service: Not on file    Active member of club or organization: Not on file    Attends meetings of clubs or organizations: Not on file     Relationship status: Not on file  . Intimate partner violence:    Fear of current or ex partner: Not on file    Emotionally abused: Not on file    Physically abused: Not on file    Forced sexual activity: Not on file  Other Topics Concern  . Not on file  Social History Narrative  . Not on file     BP 124/72   Pulse 99   Ht 5' 8.5" (1.74  m)   Wt 139 lb (63 kg)   SpO2 98%   BMI 20.83 kg/m   Physical Exam:  Frail, but Well appearing NAD HEENT: Unremarkable Neck:  6 cm JVD, no thyromegally Lymphatics:  No adenopathy Back:  No CVA tenderness Lungs:  Clear with no wheezes HEART:  Regular rate rhythm, no murmurs, no rubs, no clicks Abd:  soft, positive bowel sounds, no organomegally, no rebound, no guarding Ext:  2 plus pulses, no edema, no cyanosis, no clubbing Skin:  No rashes no nodules Neuro:  CN II through XII intact, motor grossly intact  Assess/Plan: 1. Atrial fib with a RVR - the patient is going 100/min today at rest. I have reviewed her records and recommended she undergo PPM insertion. I would first recommend adding back and uptitrating a beta blocker once she had back up pacing and would recommend using a DDD PM for a LV and RV lead with LV lead in atrial port. 2. Chronic systolic heart failure - her symptoms are well controlled on medical therapy.   Mikle Bosworth.D.

## 2018-07-21 NOTE — Progress Notes (Addendum)
Advanced Heart Failure Clinic Note  Primary HF Cardiologist: Dr Sharlotte Alamo  EP: Dr Rayann Heman  HPI: [name] is a 84 y.o. female with h/o HTN, osteoporosis, L breast cancer s/p XRT and chemo and definitive surgery, HLD, and pre diabetes.   Admitted to [facility]  ED 07/27/17 with CP. Hospital course was complicated by acute respiratory failure, bradycardic arrest, Afib RVR, and cardiogenic shock in the setting of anterior MI.  Found to have acute anterior MI and taken urgently to the cath lab. [place] 07/27/17 as below with 3v CAD, LVEF 35-40%, elevated LVEDP. Underwent PCI with successful stenting of the ramus intermediate artery with DES x 2. Had bradycardic arrest on arrival to ICU with epi/atropine and brief CPR and intubation. As she improved she was extubated on 2/24. An ECHO was completed an showed LVEF 40-45%.     Diuresed with IV lasix and once euvolemic transitioned to po lasix.  HF medications optimized. Renal function was followed closely. She had several episodes of bradycardia so beta blocker was stopped. Later developed A fib RVR so amiodarone was loaded and she was started on eliquis. Chemically converted to NSR but had some break through A fib. Plan to continue amio 400 mg twice a day for 7 days then cut back to 200 mg twice a day. Continue eliquis 2.5 mg twice a day ( reduced dose for age and creatinine). Discharge 167 pounds.   On 06/13/18 she underwent cardioversion for recurrent A fib. She returned to the A fib clinic on 06/19/18 and was back in A Fib.   She returns today for 2 week HF follow up. Last visit lasix was increased. She saw Dr Rayann Heman for recurrent Afib and he is planning on PPM insertion and will then uptitrate BB to help control AF rate. She just needs to call him when she's ready to schedule. Overall doing okay. She is still occasionally SOB with exertion, but is better overall. No orthopnea, PND, or edema. No CP or dizziness. No bleeding problems. She is nervous about PPM and  is still unsure if she wants to go through with it. Limits fluid and salt. Taking all medications. Weights 141-142 lbs at home, which is her baseline. Down 1 lb on our scale. She wants to know if she can do yoga to help with afib.   ECHO 12/2017 45-50%  ECHO 07/27/2017 EF 40-45% RVSP 50 mm hg   LHC 07/27/2017   Post Atrio lesion is 90% stenosed.  Prox LAD to Dist LAD lesion is 50% stenosed.  Ost 1st Diag lesion is 95% stenosed.  Ost 2nd Diag to 2nd Diag lesion is 90% stenosed.  Mid Cx lesion is 30% stenosed.  Ost Ramus to Ramus lesion is 100% stenosed.  A drug-eluting stent was successfully placed using a STENT SYNERGY DES 2.75X28.  A drug-eluting stent was successfully placed using a STENT SYNERGY DES 2.5X12.  Post intervention, there is a 0% residual stenosis.  There is moderate to severe left ventricular systolic dysfunction.  LV end diastolic pressure is moderately elevated.  The left ventricular ejection fraction is 35-45% by visual estimate. 1. 3 vessel obstructive CAD - 100% ramus intermediate artery. This is a large vessel and is the culprit lesion. - 95% small first diagonal - 90% small second diagonal  - diffusely diseased LAD - 90% PLOM 2. Moderate to severe LV dysfunction. EF estimated at 35-40%. 3. Elevated LVEDP 4. Successful stenting of the ramus intermediate artery with DES x 2.  Plan; DAPT for  one year  Review of systems complete and found to be negative unless listed in HPI.   SH:  Social History   Socioeconomic History  . Marital status: Married    Spouse name: Not on file  . Number of children: Not on file  . Years of education: Not on file  . Highest education level: Not on file  Occupational History  . Occupation: retired  Scientific laboratory technician  . Financial resource strain: Not on file  . Food insecurity:    Worry: Not on file    Inability: Not on file  . Transportation needs:    Medical: Not on file    Non-medical: Not on file    Tobacco Use  . Smoking status: Never Smoker  . Smokeless tobacco: Never Used  Substance and Sexual Activity  . Alcohol use: No  . Drug use: No  . Sexual activity: Not on file  Lifestyle  . Physical activity:    Days per week: Not on file    Minutes per session: Not on file  . Stress: Not on file  Relationships  . Social connections:    Talks on phone: Not on file    Gets together: Not on file    Attends religious service: Not on file    Active member of club or organization: Not on file    Attends meetings of clubs or organizations: Not on file    Relationship status: Not on file  . Intimate partner violence:    Fear of current or ex partner: Not on file    Emotionally abused: Not on file    Physically abused: Not on file    Forced sexual activity: Not on file  Other Topics Concern  . Not on file  Social History Narrative  . Not on file    FH:  Family History  Problem Relation Age of Onset  . Pancreatic cancer Mother        pancreastic  . Stroke Father   . Hypertension Brother   . Hypertension Sister   . Hypertension Brother   . Heart disease Brother   . Hypertension Sister     Past Medical History:  Diagnosis Date  . Anxiety   . Breast cancer of lower-outer quadrant of left female breast ([place]) 10/11/2014   treated with lumpectomy and radiation  . Coronary artery disease 07/27/2017  . GERD (gastroesophageal reflux disease)   . Hypertension   . Myocardial infarction ([place]) 07/27/2017   STEMI of anterolateral wall. Cath w/ stent and cabg by Dr. Martinique  . Osteoporosis 10/20/2014  . Personal history of radiation therapy 2016   treatment of breast cancer  . PONV (postoperative nausea and vomiting)   . PUD (peptic ulcer disease)   . Pure hypercholesterolemia 04/08/2014  . Stone, kidney     Current Outpatient Medications  Medication Sig Dispense Refill  . anastrozole (ARIMIDEX) 1 MG tablet TAKE 1 TABLET BY MOUTH ONCE DAILY 90 tablet 0  . atorvastatin (LIPITOR)  80 MG tablet TAKE 1 TABLET BY MOUTH ONCE DAILY AT  6  PM 90 tablet 2  . Calcium Carb-Cholecalciferol (CALCIUM + D3) 600-800 MG-UNIT TABS Take 1 tablet by mouth every evening.     . Cholecalciferol (VITAMIN D-3) 5000 units TABS Take 5,000 Units by mouth 2 (two) times daily.     . clopidogrel (PLAVIX) 75 MG tablet Take 1 tablet (75 mg total) by mouth daily. 30 tablet 6  . clotrimazole (MYCELEX) 10 MG troche Take 1 tablet (  10 mg total) by mouth 5 (five) times daily. Slowly dissolve in mouth, do not chew or swallow whole. Use ALL lozenges 70 tablet 0  . co-enzyme Q-10 30 MG capsule Take 30 mg by mouth 3 (three) times daily.    . Cyanocobalamin (B-12) 2500 MCG SUBL Place 15 drops under the tongue 4 (four) times a week. With b-6 & folic Acid    . ELIQUIS 2.5 MG TABS tablet TAKE 1 TABLET BY MOUTH TWICE DAILY 60 tablet 5  . Fe Bisgly-Vit C-Vit B12-FA (GENTLE IRON PO) Take 1-2 tablets by mouth daily.    . fish oil-omega-3 fatty acids 1000 MG capsule Take 1 g by mouth at bedtime.     . furosemide (LASIX) 20 MG tablet Take 20 mg by mouth as needed for fluid.    Marland Kitchen losartan (COZAAR) 25 MG tablet Take 1 tablet (25 mg total) by mouth 2 (two) times daily. 60 tablet 6  . magnesium oxide (MAG-OX) 400 MG tablet Take 400 mg by mouth 2 (two) times daily.    . Multiple Vitamin (MULTIVITAMIN WITH MINERALS) TABS tablet Take 1 tablet by mouth daily.    . nitroGLYCERIN (NITROSTAT) 0.3 MG SL tablet Place 1 tablet (0.3 mg total) under the tongue every 5 (five) minutes as needed for chest pain. 90 tablet 12  . spironolactone (ALDACTONE) 25 MG tablet TAKE 1 TABLET BY MOUTH ONCE DAILY 30 tablet 5   No current facility-administered medications for this encounter.    Vitals:   07/22/18 1149  BP: 120/72  Pulse: (!) 109  SpO2: 100%  Weight: 63 kg (139 lb)   ReDS Vest - 07/22/18 1100      ReDS Vest   Fitting Posture  Sitting    Height Marker  Tall   station C   Ruler Value  31.5    Center Strip  Aligned    ReDS Value   49        Wt Readings from Last 3 Encounters:  07/22/18 63 kg (139 lb)  07/15/18 63 kg (139 lb)  07/07/18 63.9 kg (140 lb 12.8 oz)    PHYSICAL EXAM:  General:  No resp difficulty. HEENT: Normal Neck: Supple. JVP 5-6. Carotids 2+ bilat; no bruits. No thyromegaly or nodule noted. Cor: PMI nondisplaced. Irregular, tachy, No M/G/R noted Lungs: CTAB, normal effort. Abdomen: Soft, non-tender, non-distended, no HSM. No bruits or masses. +BS  Extremities: No cyanosis, clubbing, or rash. R and LLE no edema.  Neuro: Alert & orientedx3, cranial nerves grossly intact. moves all 4 extremities w/o difficulty. Affect pleasant  EKG: Afib 114 bpm. Personally reviewed.   ASSESSMENT & PLAN: 1. CAD - 3V CAD with STEMI 07/2017 S/P DES x2  - No s/s ischemia.  - Continue plavix, eliquis, and high dose statin. Off ASA - Continue carvedilol 3.125 bid  2. Chronic Systolic Heart Failure, ICM- ECHO EF 07/2017 EF 40-45%  Echo 12/2017 EF 55-60%  - Volume stable on exam. ReDS clip reading has increased from 41 to 49%, but I do not think this is accurate. Weight is down 1 lb.  - Continue lasix 40 mg one day (she takes 20 mg BID) alternating with 20 mg the next. Check BMET and BNP today.  - Continue losartan 25 mg twice a day.  - Continue 25 mg spironolactone daily  - Continue coreg 3.125 bid. She has not tolerated higher doses in the past with bradycardia.  - Discussed limiting fluid and salt intake.   3. PAF S/P DC-CV  06/13/2018 to NSR but back in A fib on 1/16  - Remains in A fib. She was intolerant amio due to nausea and tremors.  - Dr Rayann Heman is planning PPM insertion, then uptitration of BB. She needs to call him to schedule procedure. She is still trying to decide if she wants to do this.  - She asked about yoga to help with afib. I told her it probably would not, but she could try and to be careful with bending over to standing positions to avoid dizziness.  - Followed in AF clinic.  - Continue  eliquis 2.5 mg BID. Denies bleeding.   4. HTN - Meds as above.   5. H.O Left Breast Cancer - No change to current plan.    6. CKD III - Creatinine 1.5-1.8.  - BMET today  7. Intermittent claudication - She had an appointment for ABIs but canceled. She only has symptoms when walking on the track.  BMET, mag, BNP Follow up in 3-4 weeks to make sure volume still okay.   Georgiana Shore, NP  12:09 PM   Greater than 50% of the 25 minute visit was spent in counseling/coordination of care regarding disease state education, salt/fluid restriction, sliding scale diuretics, and medication compliance.

## 2018-07-22 ENCOUNTER — Telehealth (HOSPITAL_COMMUNITY): Payer: Self-pay

## 2018-07-22 ENCOUNTER — Ambulatory Visit (HOSPITAL_COMMUNITY)
Admission: RE | Admit: 2018-07-22 | Discharge: 2018-07-22 | Disposition: A | Discharge status: Home / Self Care | Payer: Medicare Other | Source: Ambulatory Visit | Attending: Cardiology | Admitting: Cardiology

## 2018-07-22 VITALS — BP 120/72 | HR 109 | Wt 139.0 lb

## 2018-07-22 DIAGNOSIS — K219 Gastro-esophageal reflux disease without esophagitis: Secondary | ICD-10-CM | POA: Diagnosis not present

## 2018-07-22 DIAGNOSIS — E785 Hyperlipidemia, unspecified: Secondary | ICD-10-CM | POA: Diagnosis not present

## 2018-07-22 DIAGNOSIS — C50912 Malignant neoplasm of unspecified site of left female breast: Secondary | ICD-10-CM | POA: Insufficient documentation

## 2018-07-22 DIAGNOSIS — Z951 Presence of aortocoronary bypass graft: Secondary | ICD-10-CM | POA: Insufficient documentation

## 2018-07-22 DIAGNOSIS — Z7901 Long term (current) use of anticoagulants: Secondary | ICD-10-CM | POA: Diagnosis not present

## 2018-07-22 DIAGNOSIS — I480 Paroxysmal atrial fibrillation: Secondary | ICD-10-CM | POA: Insufficient documentation

## 2018-07-22 DIAGNOSIS — I5022 Chronic systolic (congestive) heart failure: Secondary | ICD-10-CM | POA: Diagnosis not present

## 2018-07-22 DIAGNOSIS — Z8249 Family history of ischemic heart disease and other diseases of the circulatory system: Secondary | ICD-10-CM | POA: Insufficient documentation

## 2018-07-22 DIAGNOSIS — E7800 Pure hypercholesterolemia, unspecified: Secondary | ICD-10-CM | POA: Diagnosis not present

## 2018-07-22 DIAGNOSIS — N183 Chronic kidney disease, stage 3 (moderate): Secondary | ICD-10-CM | POA: Diagnosis not present

## 2018-07-22 DIAGNOSIS — R7303 Prediabetes: Secondary | ICD-10-CM | POA: Insufficient documentation

## 2018-07-22 DIAGNOSIS — I70213 Atherosclerosis of native arteries of extremities with intermittent claudication, bilateral legs: Secondary | ICD-10-CM

## 2018-07-22 DIAGNOSIS — I739 Peripheral vascular disease, unspecified: Secondary | ICD-10-CM | POA: Insufficient documentation

## 2018-07-22 DIAGNOSIS — Z7902 Long term (current) use of antithrombotics/antiplatelets: Secondary | ICD-10-CM | POA: Diagnosis not present

## 2018-07-22 DIAGNOSIS — I252 Old myocardial infarction: Secondary | ICD-10-CM | POA: Insufficient documentation

## 2018-07-22 DIAGNOSIS — Z79899 Other long term (current) drug therapy: Secondary | ICD-10-CM | POA: Diagnosis not present

## 2018-07-22 DIAGNOSIS — Z955 Presence of coronary angioplasty implant and graft: Secondary | ICD-10-CM | POA: Diagnosis not present

## 2018-07-22 DIAGNOSIS — I130 Hypertensive heart and chronic kidney disease with heart failure and stage 1 through stage 4 chronic kidney disease, or unspecified chronic kidney disease: Secondary | ICD-10-CM | POA: Diagnosis not present

## 2018-07-22 DIAGNOSIS — I2510 Atherosclerotic heart disease of native coronary artery without angina pectoris: Secondary | ICD-10-CM | POA: Diagnosis not present

## 2018-07-22 DIAGNOSIS — I4819 Other persistent atrial fibrillation: Secondary | ICD-10-CM | POA: Diagnosis not present

## 2018-07-22 LAB — BASIC METABOLIC PANEL
ANION GAP: 9 (ref 5–15)
BUN: 35 mg/dL — ABNORMAL HIGH (ref 8–23)
CALCIUM: 10.9 mg/dL — AB (ref 8.9–10.3)
CO2: 21 mmol/L — ABNORMAL LOW (ref 22–32)
Chloride: 108 mmol/L (ref 98–111)
Creatinine, Ser: 1.57 mg/dL — ABNORMAL HIGH (ref 0.44–1.00)
GFR calc Af Amer: 35 mL/min — ABNORMAL LOW (ref >60)
GFR, EST NON AFRICAN AMERICAN: 31 mL/min — AB (ref >60)
Glucose, Bld: 96 mg/dL (ref 70–99)
Potassium: 4.5 mmol/L (ref 3.5–5.1)
Sodium: 138 mmol/L (ref 135–145)

## 2018-07-22 LAB — BRAIN NATRIURETIC PEPTIDE: B Natriuretic Peptide: 631 pg/mL — ABNORMAL HIGH (ref 0.0–100.0)

## 2018-07-22 LAB — MAGNESIUM: Magnesium: 2.5 mg/dL — ABNORMAL HIGH (ref 1.7–2.4)

## 2018-07-22 NOTE — Patient Instructions (Signed)
It was great to see you today! No medication changes are needed at this time.  Labs today We will only contact you if something comes back abnormal or we need to make some changes. Otherwise no news is good news!  Feel free to watch Yoga with Adriene on YouTube  Your physician recommends that you schedule a follow-up appointment in: 3-4 weeks  in the Advanced Practitioners (PA/NP) Clinic    Do the following things EVERYDAY: 1) Weigh yourself in the morning before breakfast. Write it down and keep it in a log. 2) Take your medicines as prescribed 3) Eat low salt foods-Limit salt (sodium) to 2000 mg per day.  4) Stay as active as you can everyday 5) Limit all fluids for the day to less than 2 liters

## 2018-07-22 NOTE — Telephone Encounter (Signed)
Informed pt of results, she was agreeable and verbalized understanding to taking extra Lasix x 3 days

## 2018-07-22 NOTE — Telephone Encounter (Signed)
-----   Message from Georgiana Shore, NP sent at 07/22/2018  1:58 PM EST ----- Renal function on lower side, probably dilutional. BNP remains elevated. Please call and ask her to take lasix 20 mg BID for 3 days in a row, then back to previous dose. Thanks!

## 2018-07-27 ENCOUNTER — Other Ambulatory Visit (HOSPITAL_COMMUNITY): Payer: Self-pay | Admitting: Adult Health

## 2018-07-28 ENCOUNTER — Telehealth: Payer: Self-pay | Admitting: Internal Medicine

## 2018-07-28 NOTE — Telephone Encounter (Signed)
New message:    Patient calling about a pacemaker schedule. Please call patient she also has some question. Patient states that she called this morning no one has returned her call.

## 2018-07-28 NOTE — Telephone Encounter (Signed)
New Message:    Patient calling concerning a pacemaker. Please call patient back.

## 2018-07-30 NOTE — Telephone Encounter (Signed)
See Mychart message.  Attempted outreach to patient on 07/29/2018.  Call rang and then went to busy signal.  Attempted outreach to patient today 07/30/2018.  Call rang and then went to busy signal.  Pt was advised on MyChart that this nurse had attempted outreach.  Pt has read message on MyChart.  Will advise Pt via Mychart that if she is interested in discussing pacemaker/questions she can call office or send message via [place].

## 2018-08-01 ENCOUNTER — Telehealth: Payer: Self-pay | Admitting: Internal Medicine

## 2018-08-01 NOTE — Telephone Encounter (Signed)
Returned call to Pt.  Discussed all risks associated with PPM procedure.  All Pt questions answered to her satisfaction.  At this time Pt prefers to focus on lifestyle modification to control afib.  [facility]    Advised Pt she can call us anytime.

## 2018-08-01 NOTE — Telephone Encounter (Signed)
New Message.          Patient has questions about a Psychologist, forensic, she would like a call by 1:00 if she prefers that you call on Monday.

## 2018-08-05 ENCOUNTER — Telehealth: Payer: Self-pay | Admitting: Family Medicine

## 2018-08-05 NOTE — Telephone Encounter (Signed)
mychart message sent to pt about their appointment with Dr Shaw °

## 2018-08-19 ENCOUNTER — Other Ambulatory Visit: Payer: Self-pay

## 2018-08-19 ENCOUNTER — Encounter (HOSPITAL_COMMUNITY): Payer: Self-pay

## 2018-08-19 ENCOUNTER — Ambulatory Visit (HOSPITAL_COMMUNITY)
Admission: RE | Admit: 2018-08-19 | Discharge: 2018-08-19 | Disposition: A | Discharge status: Home / Self Care | Payer: Medicare Other | Source: Ambulatory Visit | Attending: Cardiology | Admitting: Cardiology

## 2018-08-19 VITALS — BP 118/64 | HR 102 | Wt 136.6 lb

## 2018-08-19 DIAGNOSIS — Z8249 Family history of ischemic heart disease and other diseases of the circulatory system: Secondary | ICD-10-CM | POA: Insufficient documentation

## 2018-08-19 DIAGNOSIS — I480 Paroxysmal atrial fibrillation: Secondary | ICD-10-CM | POA: Insufficient documentation

## 2018-08-19 DIAGNOSIS — I5022 Chronic systolic (congestive) heart failure: Secondary | ICD-10-CM | POA: Diagnosis not present

## 2018-08-19 DIAGNOSIS — I4819 Other persistent atrial fibrillation: Secondary | ICD-10-CM

## 2018-08-19 DIAGNOSIS — I2510 Atherosclerotic heart disease of native coronary artery without angina pectoris: Secondary | ICD-10-CM | POA: Insufficient documentation

## 2018-08-19 DIAGNOSIS — Z7902 Long term (current) use of antithrombotics/antiplatelets: Secondary | ICD-10-CM | POA: Insufficient documentation

## 2018-08-19 DIAGNOSIS — I70213 Atherosclerosis of native arteries of extremities with intermittent claudication, bilateral legs: Secondary | ICD-10-CM

## 2018-08-19 DIAGNOSIS — Z923 Personal history of irradiation: Secondary | ICD-10-CM | POA: Diagnosis not present

## 2018-08-19 DIAGNOSIS — N183 Chronic kidney disease, stage 3 unspecified: Secondary | ICD-10-CM

## 2018-08-19 DIAGNOSIS — R7303 Prediabetes: Secondary | ICD-10-CM | POA: Insufficient documentation

## 2018-08-19 DIAGNOSIS — Z955 Presence of coronary angioplasty implant and graft: Secondary | ICD-10-CM | POA: Diagnosis not present

## 2018-08-19 DIAGNOSIS — E7800 Pure hypercholesterolemia, unspecified: Secondary | ICD-10-CM | POA: Insufficient documentation

## 2018-08-19 DIAGNOSIS — Z79811 Long term (current) use of aromatase inhibitors: Secondary | ICD-10-CM | POA: Insufficient documentation

## 2018-08-19 DIAGNOSIS — Z7901 Long term (current) use of anticoagulants: Secondary | ICD-10-CM | POA: Diagnosis not present

## 2018-08-19 DIAGNOSIS — Z951 Presence of aortocoronary bypass graft: Secondary | ICD-10-CM | POA: Diagnosis not present

## 2018-08-19 DIAGNOSIS — Z853 Personal history of malignant neoplasm of breast: Secondary | ICD-10-CM | POA: Diagnosis not present

## 2018-08-19 DIAGNOSIS — I252 Old myocardial infarction: Secondary | ICD-10-CM | POA: Insufficient documentation

## 2018-08-19 DIAGNOSIS — I130 Hypertensive heart and chronic kidney disease with heart failure and stage 1 through stage 4 chronic kidney disease, or unspecified chronic kidney disease: Secondary | ICD-10-CM | POA: Insufficient documentation

## 2018-08-19 DIAGNOSIS — Z800 Family history of malignant neoplasm of digestive organs: Secondary | ICD-10-CM | POA: Insufficient documentation

## 2018-08-19 DIAGNOSIS — I739 Peripheral vascular disease, unspecified: Secondary | ICD-10-CM | POA: Insufficient documentation

## 2018-08-19 DIAGNOSIS — Z79899 Other long term (current) drug therapy: Secondary | ICD-10-CM | POA: Insufficient documentation

## 2018-08-19 LAB — CBC
HCT: 30.9 % — ABNORMAL LOW (ref 36.0–46.0)
HEMOGLOBIN: 9.6 g/dL — AB (ref 12.0–15.0)
MCH: 29.5 pg (ref 26.0–34.0)
MCHC: 31.1 g/dL (ref 30.0–36.0)
MCV: 95.1 fL (ref 80.0–100.0)
Platelets: 195 10*3/uL (ref 150–400)
RBC: 3.25 MIL/uL — AB (ref 3.87–5.11)
RDW: 13.7 % (ref 11.5–15.5)
WBC: 4.7 10*3/uL (ref 4.0–10.5)
nRBC: 0 % (ref 0.0–0.2)

## 2018-08-19 LAB — BASIC METABOLIC PANEL
ANION GAP: 10 (ref 5–15)
BUN: 36 mg/dL — AB (ref 8–23)
CO2: 22 mmol/L (ref 22–32)
Calcium: 11 mg/dL — ABNORMAL HIGH (ref 8.9–10.3)
Chloride: 107 mmol/L (ref 98–111)
Creatinine, Ser: 1.81 mg/dL — ABNORMAL HIGH (ref 0.44–1.00)
GFR calc Af Amer: 30 mL/min — ABNORMAL LOW (ref >60)
GFR calc non Af Amer: 26 mL/min — ABNORMAL LOW (ref >60)
GLUCOSE: 116 mg/dL — AB (ref 70–99)
Potassium: 4.3 mmol/L (ref 3.5–5.1)
Sodium: 139 mmol/L (ref 135–145)

## 2018-08-19 NOTE — Patient Instructions (Signed)
Lab work done today. We will notify you of any abnormal lab work.  Please follow up with Dr. Haroldine Laws in 6-8 weeks.

## 2018-08-19 NOTE — Progress Notes (Signed)
Advanced Heart Failure Clinic Note  Primary HF Cardiologist: Dr Sharlotte Alamo  EP: Dr Rayann Heman  HPI: [name] is a 84 y.o. female with h/o HTN, osteoporosis, L breast cancer s/p XRT and chemo and definitive surgery, HLD, and pre diabetes.   Admitted to [facility]  ED 07/27/17 with CP. Hospital course was complicated by acute respiratory failure, bradycardic arrest, Afib RVR, and cardiogenic shock in the setting of anterior MI.  Found to have acute anterior MI and taken urgently to the cath lab. [place] 07/27/17 as below with 3v CAD, LVEF 35-40%, elevated LVEDP. Underwent PCI with successful stenting of the ramus intermediate artery with DES x 2. Had bradycardic arrest on arrival to ICU with epi/atropine and brief CPR and intubation. As she improved she was extubated on 2/24. An ECHO was completed an showed LVEF 40-45%.     Diuresed with IV lasix and once euvolemic transitioned to po lasix.  HF medications optimized. Renal function was followed closely. She had several episodes of bradycardia so beta blocker was stopped. Later developed A fib RVR so amiodarone was loaded and she was started on eliquis. Chemically converted to NSR but had some break through A fib. Plan to continue amio 400 mg twice a day for 7 days then cut back to 200 mg twice a day. Continue eliquis 2.5 mg twice a day ( reduced dose for age and creatinine). Discharge 167 pounds.   On 06/13/18 she underwent cardioversion for recurrent A fib. She returned to the A fib clinic on 06/19/18 and was back in A Fib.   She presents today for regular follow up. No changes made at last appointment. She has deferred PPM insertion, with plans to attempt "lifestyle modifications" to help her Afib. She has been told this is unlikely to help. She is feeling good overall today. She denies any SOB with ADLs. She can work around the house for 15-20 minutes prior to getting any SOB. She has deferred PPM consideration for now. Weight stable at home. Her and her  husband feel like the are OK for the next week or two with groceries. They have been ordering groceries online.   ECHO 12/2017 45-50%  ECHO 07/27/2017 EF 40-45% RVSP 50 mm hg   LHC 07/27/2017   Post Atrio lesion is 90% stenosed.  Prox LAD to Dist LAD lesion is 50% stenosed.  Ost 1st Diag lesion is 95% stenosed.  Ost 2nd Diag to 2nd Diag lesion is 90% stenosed.  Mid Cx lesion is 30% stenosed.  Ost Ramus to Ramus lesion is 100% stenosed.  A drug-eluting stent was successfully placed using a STENT SYNERGY DES 2.75X28.  A drug-eluting stent was successfully placed using a STENT SYNERGY DES 2.5X12.  Post intervention, there is a 0% residual stenosis.  There is moderate to severe left ventricular systolic dysfunction.  LV end diastolic pressure is moderately elevated.  The left ventricular ejection fraction is 35-45% by visual estimate. 1. 3 vessel obstructive CAD - 100% ramus intermediate artery. This is a large vessel and is the culprit lesion. - 95% small first diagonal - 90% small second diagonal  - diffusely diseased LAD - 90% PLOM 2. Moderate to severe LV dysfunction. EF estimated at 35-40%. 3. Elevated LVEDP 4. Successful stenting of the ramus intermediate artery with DES x 2.  Plan; DAPT for one year  Review of systems complete and found to be negative unless listed in HPI.    SH:  Social History   Socioeconomic History  . Marital  status: Married    Spouse name: Not on file  . Number of children: Not on file  . Years of education: Not on file  . Highest education level: Not on file  Occupational History  . Occupation: retired  Scientific laboratory technician  . Financial resource strain: Not on file  . Food insecurity:    Worry: Not on file    Inability: Not on file  . Transportation needs:    Medical: Not on file    Non-medical: Not on file  Tobacco Use  . Smoking status: Never Smoker  . Smokeless tobacco: Never Used  Substance and Sexual Activity  .  Alcohol use: No  . Drug use: No  . Sexual activity: Not on file  Lifestyle  . Physical activity:    Days per week: Not on file    Minutes per session: Not on file  . Stress: Not on file  Relationships  . Social connections:    Talks on phone: Not on file    Gets together: Not on file    Attends religious service: Not on file    Active member of club or organization: Not on file    Attends meetings of clubs or organizations: Not on file    Relationship status: Not on file  . Intimate partner violence:    Fear of current or ex partner: Not on file    Emotionally abused: Not on file    Physically abused: Not on file    Forced sexual activity: Not on file  Other Topics Concern  . Not on file  Social History Narrative  . Not on file    FH:  Family History  Problem Relation Age of Onset  . Pancreatic cancer Mother        pancreastic  . Stroke Father   . Hypertension Brother   . Hypertension Sister   . Hypertension Brother   . Heart disease Brother   . Hypertension Sister     Past Medical History:  Diagnosis Date  . Anxiety   . Breast cancer of lower-outer quadrant of left female breast ([place]) 10/11/2014   treated with lumpectomy and radiation  . Coronary artery disease 07/27/2017  . GERD (gastroesophageal reflux disease)   . Hypertension   . Myocardial infarction ([place]) 07/27/2017   STEMI of anterolateral wall. Cath w/ stent and cabg by Dr. Martinique  . Osteoporosis 10/20/2014  . Personal history of radiation therapy 2016   treatment of breast cancer  . PONV (postoperative nausea and vomiting)   . PUD (peptic ulcer disease)   . Pure hypercholesterolemia 04/08/2014  . Stone, kidney     Current Outpatient Medications  Medication Sig Dispense Refill  . anastrozole (ARIMIDEX) 1 MG tablet TAKE 1 TABLET BY MOUTH ONCE DAILY 90 tablet 0  . atorvastatin (LIPITOR) 80 MG tablet TAKE 1 TABLET BY MOUTH ONCE DAILY AT  6  PM 90 tablet 2  . Calcium Carb-Cholecalciferol (CALCIUM + D3)  600-800 MG-UNIT TABS Take 1 tablet by mouth every evening.     . Cholecalciferol (VITAMIN D-3) 5000 units TABS Take 5,000 Units by mouth 2 (two) times daily.     . clopidogrel (PLAVIX) 75 MG tablet TAKE 1 TABLET BY MOUTH ONCE DAILY 90 tablet 1  . clotrimazole (MYCELEX) 10 MG troche Take 1 tablet (10 mg total) by mouth 5 (five) times daily. Slowly dissolve in mouth, do not chew or swallow whole. Use ALL lozenges 70 tablet 0  . co-enzyme Q-10 30 MG capsule  Take 30 mg by mouth 3 (three) times daily.    . Cyanocobalamin (B-12) 2500 MCG SUBL Place 15 drops under the tongue 4 (four) times a week. With b-6 & folic Acid    . ELIQUIS 2.5 MG TABS tablet TAKE 1 TABLET BY MOUTH TWICE DAILY 60 tablet 5  . Fe Bisgly-Vit C-Vit B12-FA (GENTLE IRON PO) Take 1-2 tablets by mouth daily.    . fish oil-omega-3 fatty acids 1000 MG capsule Take 1 g by mouth at bedtime.     . furosemide (LASIX) 20 MG tablet Take 20 mg by mouth as needed for fluid.    Marland Kitchen losartan (COZAAR) 25 MG tablet Take 1 tablet (25 mg total) by mouth 2 (two) times daily. 60 tablet 6  . magnesium oxide (MAG-OX) 400 MG tablet Take 400 mg by mouth 2 (two) times daily.    . Multiple Vitamin (MULTIVITAMIN WITH MINERALS) TABS tablet Take 1 tablet by mouth daily.    . nitroGLYCERIN (NITROSTAT) 0.3 MG SL tablet Place 1 tablet (0.3 mg total) under the tongue every 5 (five) minutes as needed for chest pain. 90 tablet 12  . spironolactone (ALDACTONE) 25 MG tablet TAKE 1 TABLET BY MOUTH ONCE DAILY 30 tablet 5   No current facility-administered medications for this encounter.    Vitals:   08/19/18 1339  BP: 118/64  Pulse: (!) 102  SpO2: 97%  Weight: 62 kg (136 lb 9.6 oz)     Wt Readings from Last 3 Encounters:  08/19/18 62 kg (136 lb 9.6 oz)  07/22/18 63 kg (139 lb)  07/15/18 63 kg (139 lb)    PHYSICAL EXAM:  General: NAD  HEENT: Normal Neck: Supple. JVP 5-6. Carotids 2+ bilat; no bruits. No thyromegaly or nodule noted. Cor: PMI nondisplaced.  Irregular, tachy.  Lungs: CTAB, normal effort. Abdomen: Soft, non-tender, non-distended, no HSM. No bruits or masses. +BS  Extremities: No cyanosis, clubbing, or rash. R and LLE no edema.  Neuro: Alert & orientedx3, cranial nerves grossly intact. moves all 4 extremities w/o difficulty. Affect pleasant   ASSESSMENT & PLAN: 1. CAD - 3V CAD with STEMI 07/2017 S/P DES x2  - No s/s of ischemia.    - Continue plavix, eliquis, and high dose statin. Off ASA - Continue carvedilol 3.125 bid  2. Chronic Systolic Heart Failure, ICM- ECHO EF 07/2017 EF 40-45%  Echo 12/2017 EF 55-60%  - Volume stable stable on exam.   - She is taking lasix 20 mg 2-3 times a week. Hasn't needed in last several days.  - Continue losartan 25 mg twice a day.  - Continue 25 mg spironolactone daily  - Continue coreg 3.125 bid. She has not tolerated higher doses in the past with bradycardia.  - Reinforced fluid restriction to < 2 L daily, sodium restriction to less than 2000 mg daily, and the importance of daily weights.    3. PAF S/P DC-CV 06/13/2018 to NSR but back in A fib on 1/16  - Remains in A fib. She was intolerant amio due to nausea and tremors.  - Dr Rayann Heman was planning PPM insertion, then uptitration of BB. She has deferred this procedure for now.  - She asked about yoga to help with afib. Has been told it probably would not, but she should be careful with bending over to standing positions to avoid dizziness.  - Followed in AF clinic.  - Continue eliquis 2.5 mg BID. Denies bleeding.   4. HTN - Meds as above.   5.  H.O Left Breast Cancer - No change to current plan.    6. CKD III - Creatinine 1.5-1.8.  - BMET today.   7. Intermittent claudication - She had an appointment for ABIs but canceled. She only has symptoms when walking on the track. No change.   Doing well overall. Reviewed social distancing and encouraged her to stay home as much as possible. She knows to call our office with any symptoms, or  difficult getting food to contact our social workers.   Shirley Friar, PA-C  1:45 PM   Greater than 50% of the 25 minute visit was spent in counseling/coordination of care regarding disease state education, salt/fluid restriction, sliding scale diuretics, and medication compliance.

## 2018-08-28 ENCOUNTER — Other Ambulatory Visit: Payer: Self-pay | Admitting: Oncology

## 2018-08-31 ENCOUNTER — Other Ambulatory Visit (HOSPITAL_COMMUNITY): Payer: Self-pay | Admitting: Adult Health

## 2018-09-04 ENCOUNTER — Ambulatory Visit: Payer: Medicare Other | Admitting: Family Medicine

## 2018-09-15 DIAGNOSIS — N184 Chronic kidney disease, stage 4 (severe): Secondary | ICD-10-CM | POA: Diagnosis not present

## 2018-09-15 DIAGNOSIS — I4891 Unspecified atrial fibrillation: Secondary | ICD-10-CM | POA: Diagnosis not present

## 2018-09-15 DIAGNOSIS — I129 Hypertensive chronic kidney disease with stage 1 through stage 4 chronic kidney disease, or unspecified chronic kidney disease: Secondary | ICD-10-CM | POA: Diagnosis not present

## 2018-09-23 ENCOUNTER — Other Ambulatory Visit (HOSPITAL_COMMUNITY): Payer: Self-pay | Admitting: Adult Health

## 2018-09-23 MED ORDER — LOSARTAN POTASSIUM 25 MG PO TABS: 25.0000 mg | ORAL_TABLET | Freq: Two times a day (BID) | ORAL | 3 refills | Status: DC

## 2018-09-23 NOTE — Addendum Note (Signed)
Addended by: Valeda Malm on: 09/23/2018 03:44 PM   Modules accepted: Orders

## 2018-10-09 ENCOUNTER — Ambulatory Visit (HOSPITAL_COMMUNITY)
Admission: RE | Admit: 2018-10-09 | Discharge: 2018-10-09 | Disposition: A | Discharge status: Home / Self Care | Payer: Medicare Other | Source: Ambulatory Visit | Attending: Internal Medicine | Admitting: Internal Medicine

## 2018-10-09 ENCOUNTER — Other Ambulatory Visit (HOSPITAL_COMMUNITY): Payer: Self-pay | Admitting: Internal Medicine

## 2018-10-09 ENCOUNTER — Other Ambulatory Visit: Payer: Self-pay

## 2018-10-09 ENCOUNTER — Encounter (HOSPITAL_COMMUNITY): Payer: Self-pay

## 2018-10-09 DIAGNOSIS — I5022 Chronic systolic (congestive) heart failure: Secondary | ICD-10-CM | POA: Diagnosis not present

## 2018-10-09 DIAGNOSIS — I2510 Atherosclerotic heart disease of native coronary artery without angina pectoris: Secondary | ICD-10-CM | POA: Diagnosis not present

## 2018-10-09 NOTE — Addendum Note (Signed)
Encounter addended by: Marlise Eves, RN on: 10/09/2018 12:10 PM  Actions taken: Clinical Note Signed

## 2018-10-09 NOTE — Progress Notes (Addendum)
Spoke to pt to review after summary review. Pt agreeable and verbalized understanding. Follow up appointments made.

## 2018-10-09 NOTE — Patient Instructions (Addendum)
[facility] will be in contact with you in order to come to your home and do a vest reading on you.  You are scheduled for a lab draw here on May12th at 11:30am.  STOP Plavix  CONTINUE Eliquis  Keep follow up appointment 9/10 with Dr. Haroldine Laws with an echocardiogram.

## 2018-10-09 NOTE — Progress Notes (Signed)
Heart Failure TeleHealth Note  Due to national recommendations of social distancing due to COVID 19, Audio/video telehealth visit is felt to be most appropriate for this patient at this time.  See MyChart message from today for patient consent regarding telehealth for [facility].  Date:  10/09/2018   ID:  [name], DOB 05/30/1935, MRN [id number]  Location: Home  Provider location: [place] [facility] Type of Visit: Established patient  PCP:  Sherren Mocha, MD  Cardiologist:  No primary care provider on file. Primary HF: Cael Worth  Chief Complaint: Heart Failure follow-up   History of Present Illness:  [name] is a 84 y.o. female with h/o HTN, breast cancer, CAD, PAF.   Admitted to [facility]  ED 07/27/17 with CP.Hospital course was complicated by acute respiratory failure, bradycardic arrest, Afib RVR, and cardiogenic shock in the setting of anterior MI. Found to have acute anterior MI and takenurgently to thecath lab. [place] 07/27/17 as below with 3v CAD, LVEF 35-40%, elevated LVEDP. Underwent PCI with successful stenting of the ramus intermediate artery with DES x 2. Had bradycardic arrest on arrival to ICU with epi/atropine and brief CPR and intubation. As she improved she was extubated on 2/24. An ECHO was completed an showed LVEF 40-45%.   Diuresed with IV lasix and once euvolemic transitioned to po lasix. HF medications optimized. Renal function was followed closely. She had several episodes of bradycardia so beta blocker was stopped. Later developed A fib RVR so amiodarone was loaded and she was started on eliquis. Chemically converted to NSR but had some break through A fib. Plan to continue amio 400 mg twice a day for 7 days then cut back to 200 mg twice a day. Continue eliquis 2.5 mg twice a day ( reduced dose for age and creatinine). Discharge 167 pounds.   On 06/13/18 she underwent cardioversion for recurrent A fib. She returned to the A fib  clinic on 06/19/18 and was back in A Fib.   Shepresents via Web designer for a telehealth visit today. Says she feels ok. Has mild SOB but stable. Able to do all her ADLs. No CP or edema. No palpitations. Taking lasix as needed - usually takes daily now. Weight stable. No dizziness. Weight stable 135-136. BP 114/82. HR 80s  ECHO 12/2017 45-50%  ECHO 07/27/2017 EF 40-45% RVSP 50 mm hg   LHC 07/27/2017  Post Atrio lesion is 90% stenosed.  Prox LAD to Dist LAD lesion is 50% stenosed.  Ost 1st Diag lesion is 95% stenosed.  Ost 2nd Diag to 2nd Diag lesion is 90% stenosed.  Mid Cx lesion is 30% stenosed.  Ost Ramus to Ramus lesion is 100% stenosed.  A drug-eluting stent was successfully placed using a STENT SYNERGY DES 2.75X28.  A drug-eluting stent was successfully placed using a STENT SYNERGY DES 2.5X12.  Post intervention, there is a 0% residual stenosis.  There is moderate to severe left ventricular systolic dysfunction.  LV end diastolic pressure is moderately elevated.  The left ventricular ejection fraction is 35-45% by visual estimate. 1. 3 vessel obstructive CAD - 100% ramus intermediate artery. This is a large vessel and is the culprit lesion. - 95% small first diagonal - 90% small second diagonal  - diffusely diseased LAD - 90% PLOM 2. Moderate to severe LV dysfunction. EF estimated at 35-40%. 3. Elevated LVEDP 4. Successful stenting of the ramus intermediate artery with DES x 2.  Plan; DAPT for one year  [name] denies symptoms worrisome for COVID 19.   Past Medical History:  Diagnosis Date  . Anxiety   . Breast cancer of lower-outer quadrant of left female breast ([facility]) 10/11/2014   treated with lumpectomy and radiation  . Coronary artery disease 07/27/2017  . GERD (gastroesophageal reflux disease)   . Hypertension   . Myocardial infarction ([facility]) 07/27/2017   STEMI of anterolateral wall. Cath w/ stent and cabg by Dr.  [place]  . Osteoporosis 10/20/2014  . Personal history of radiation therapy 2016   treatment of breast cancer  . PONV (postoperative nausea and vomiting)   . PUD (peptic ulcer disease)   . Pure hypercholesterolemia 04/08/2014  . Stone, kidney    Past Surgical History:  Procedure Laterality Date  . BREAST LUMPECTOMY Left 2016  . BREAST LUMPECTOMY WITH SENTINEL LYMPH NODE BIOPSY Left 10/22/2014   Procedure: BREAST LUMPECTOMY WITH SENTINEL LYMPH NODE BIOPSY;  Surgeon: Chevis Pretty III, MD;  Location: [facility];  Service: General;  Laterality: Left;  . CARDIOVERSION N/A 06/13/2018   Procedure: CARDIOVERSION;  Surgeon: Dolores Patty, MD;  Location: [facility] ENDOSCOPY;  Service: Cardiovascular;  Laterality: N/A;  . CORONARY STENT INTERVENTION N/A 07/27/2017   Procedure: CORONARY STENT INTERVENTION;  Surgeon: Swaziland, Peter M, MD;  Location: [facility] INVASIVE CV LAB;  Service: Cardiovascular;  Laterality: N/A;  . CORONARY/GRAFT ACUTE MI REVASCULARIZATION N/A 07/27/2017   Procedure: Coronary/Graft Acute MI Revascularization;  Surgeon: Swaziland, Peter M, MD;  Location: [facility] INVASIVE CV LAB;  Service: Cardiovascular;  Laterality: N/A;  . LEFT HEART CATH AND CORONARY ANGIOGRAPHY N/A 07/27/2017   Procedure: LEFT HEART CATH AND CORONARY ANGIOGRAPHY;  Surgeon: Swaziland, Peter M, MD;  Location: [facility] INVASIVE CV LAB;  Service: Cardiovascular;  Laterality: N/A;  . RE-EXCISION OF BREAST CANCER,SUPERIOR MARGINS Left 11/04/2014   Procedure: RE-EXCISION OF LEFT BREAST INFERIOR MARGINS;  Surgeon: Chevis Pretty III, MD;  Location: [facility] OR;  Service: General;  Laterality: Left;     Current Outpatient Medications  Medication Sig Dispense Refill  . anastrozole (ARIMIDEX) 1 MG tablet Take 1 tablet by mouth once daily 90 tablet 0  . atorvastatin (LIPITOR) 80 MG tablet TAKE 1 TABLET BY MOUTH ONCE DAILY AT  6  PM 90 tablet 2  . Calcium Carb-Cholecalciferol (CALCIUM + D3) 600-800 MG-UNIT TABS Take 1 tablet by mouth every evening.      . Cholecalciferol (VITAMIN D-3) 5000 units TABS Take 5,000 Units by mouth 2 (two) times daily.     . clopidogrel (PLAVIX) 75 MG tablet TAKE 1 TABLET BY MOUTH ONCE DAILY 90 tablet 1  . clotrimazole (MYCELEX) 10 MG troche Take 1 tablet (10 mg total) by mouth 5 (five) times daily. Slowly dissolve in mouth, do not chew or swallow whole. Use ALL lozenges 70 tablet 0  . co-enzyme Q-10 30 MG capsule Take 30 mg by mouth 3 (three) times daily.    . Cyanocobalamin (B-12) 2500 MCG SUBL Place 15 drops under the tongue 4 (four) times a week. With b-6 & folic Acid    . ELIQUIS 2.5 MG TABS tablet Take 1 tablet by mouth twice daily 60 tablet 0  . Fe Bisgly-Vit C-Vit B12-FA (GENTLE IRON PO) Take 1-2 tablets by mouth daily.    . fish oil-omega-3 fatty acids 1000 MG capsule Take 1 g by mouth at bedtime.     . furosemide (LASIX) 20 MG tablet Take 20 mg by mouth as needed for fluid.    Marland Kitchen losartan (COZAAR) 25 MG  tablet Take 1 tablet (25 mg total) by mouth 2 (two) times daily. 180 tablet 3  . magnesium oxide (MAG-OX) 400 MG tablet Take 400 mg by mouth 2 (two) times daily.    . Multiple Vitamin (MULTIVITAMIN WITH MINERALS) TABS tablet Take 1 tablet by mouth daily.    . nitroGLYCERIN (NITROSTAT) 0.3 MG SL tablet Place 1 tablet (0.3 mg total) under the tongue every 5 (five) minutes as needed for chest pain. 90 tablet 12  . spironolactone (ALDACTONE) 25 MG tablet Take 1 tablet by mouth once daily 30 tablet 5   No current facility-administered medications for this encounter.     Allergies:   Lisinopril   Social History:  The patient  reports that she has never smoked. She has never used smokeless tobacco. She reports that she does not drink alcohol or use drugs.   Family History:  The patient's family history includes Heart disease in her brother; Hypertension in her brother, brother, sister, and sister; Pancreatic cancer in her mother; Stroke in her father.   ROS:  Please see the history of present illness.   All  other systems are personally reviewed and negative.   Exam:  (Video/Tele Health Call; Exam is subjective and or/visual.) General:  Speaks in full sentences. No resp difficulty. Lungs: Normal respiratory effort with conversation.  Abdomen: Non-distended per patient report Extremities: Pt denies edema. Neuro: Alert & oriented x 3.   Recent Labs: 05/15/2018: ALT 40; TSH 1.230 07/22/2018: B Natriuretic Peptide 631.0; Magnesium 2.5 08/19/2018: BUN 36; Creatinine, Ser 1.81; Hemoglobin 9.6; Platelets 195; Potassium 4.3; Sodium 139  Personally reviewed   Wt Readings from Last 3 Encounters:  08/19/18 62 kg (136 lb 9.6 oz)  07/22/18 63 kg (139 lb)  07/15/18 63 kg (139 lb)      ASSESSMENT AND PLAN:  1. CAD - 3V CAD with STEMI 07/2017 S/P DES x2  - No s/s of ischemia.    - Continue eliquis, and high dose statin. Off ASA - Can stop Plavix as > 1 year from PCI - Continue carvedilol 3.125 bid  2. Chronic Systolic Heart Failure, ICM- ECHO EF 07/2017 EF 40-45%  Echo 12/2017 EF 45-50%  - Volume stable by report but still SOB - She is taking lasix 20 mg PRN but using most days per week  - Continue losartan 25 mg twice a day. - Continue 25 mg spironolactone daily  - Continue coreg 3.125 bid. She has not tolerated higher doses in the past with bradycardia.  - Reinforced fluid restriction to < 2 L daily, sodium restriction to less than 2000 mg daily, and the importance of daily weights.   - Will send [facility] to check ReDS readings labs and Al[facility] tracing   3. Permanent AF -S/P DC-CV 06/13/2018 to NSR but back in A fib on 1/16  - Remains in A fib. She was intolerant amio due to nausea and tremors.  - Dr Johney Frame was planning PPM insertion, then uptitration of BB. She has deferred this procedure for now.  - She asked about yoga to help with afib. Has been told it probably would not, but she should be careful with bending over to standing positions to avoid dizziness.  - Followed in AF  clinic.  - Continue eliquis 2.5 mg BID. Denies bleeding.  - Check AliveCor reading   4. HTN - Blood pressure well controlled. Continue current regimen.  5. H.O Left Breast Cancer - No change to current plan.    6.  CKD III - Creatinine 1.5-1.8.  - Recheck labs  COVID screen The patient does not have any symptoms that suggest any further testing/ screening at this time.  Social distancing reinforced today.  Recommended follow-up:  As above  Relevant cardiac medications were reviewed at length with the patient today.   The patient does not have concerns regarding their medications at this time.   The following changes were made today:  As above  Today, I have spent 21 minutes with the patient with telehealth technology discussing the above issues .    Signed, Arvilla Meres, MD  10/09/2018 11:00 AM  Advanced Heart Failure Clinic [facility] [address] [facility] [place] [address] [phone] (office) [phone] (fax)

## 2018-10-09 NOTE — Addendum Note (Signed)
Encounter addended by: Marlise Eves, RN on: 10/09/2018 2:12 PM  Actions taken: Clinical Note Signed

## 2018-10-10 ENCOUNTER — Other Ambulatory Visit: Payer: Self-pay | Admitting: Oncology

## 2018-10-10 ENCOUNTER — Other Ambulatory Visit (HOSPITAL_COMMUNITY): Payer: Self-pay | Admitting: Adult Health

## 2018-10-13 ENCOUNTER — Other Ambulatory Visit (HOSPITAL_COMMUNITY): Payer: Self-pay | Admitting: Cardiology

## 2018-10-13 MED ORDER — APIXABAN 2.5 MG PO TABS: 2.5000 mg | ORAL_TABLET | Freq: Two times a day (BID) | ORAL | 3 refills | Status: DC

## 2018-10-14 ENCOUNTER — Other Ambulatory Visit (HOSPITAL_COMMUNITY): Payer: Medicare Other

## 2018-10-15 ENCOUNTER — Ambulatory Visit (HOSPITAL_COMMUNITY)
Admission: RE | Admit: 2018-10-15 | Discharge: 2018-10-15 | Disposition: A | Discharge status: Home / Self Care | Payer: Medicare Other | Source: Ambulatory Visit | Attending: Internal Medicine | Admitting: Internal Medicine

## 2018-10-15 ENCOUNTER — Encounter (HOSPITAL_COMMUNITY): Payer: Medicare Other | Admitting: Internal Medicine

## 2018-10-15 ENCOUNTER — Other Ambulatory Visit (HOSPITAL_COMMUNITY): Payer: Self-pay

## 2018-10-15 ENCOUNTER — Other Ambulatory Visit: Payer: Self-pay

## 2018-10-15 DIAGNOSIS — I5022 Chronic systolic (congestive) heart failure: Secondary | ICD-10-CM | POA: Diagnosis not present

## 2018-10-15 LAB — CBC
HCT: 32.6 % — ABNORMAL LOW (ref 36.0–46.0)
Hemoglobin: 10 g/dL — ABNORMAL LOW (ref 12.0–15.0)
MCH: 29.5 pg (ref 26.0–34.0)
MCHC: 30.7 g/dL (ref 30.0–36.0)
MCV: 96.2 fL (ref 80.0–100.0)
Platelets: 199 10*3/uL (ref 150–400)
RBC: 3.39 MIL/uL — ABNORMAL LOW (ref 3.87–5.11)
RDW: 14.4 % (ref 11.5–15.5)
WBC: 3.9 10*3/uL — ABNORMAL LOW (ref 4.0–10.5)
nRBC: 0 % (ref 0.0–0.2)

## 2018-10-15 LAB — BASIC METABOLIC PANEL
Anion gap: 7 (ref 5–15)
BUN: 40 mg/dL — ABNORMAL HIGH (ref 8–23)
CO2: 22 mmol/L (ref 22–32)
Calcium: 10.7 mg/dL — ABNORMAL HIGH (ref 8.9–10.3)
Chloride: 109 mmol/L (ref 98–111)
Creatinine, Ser: 1.67 mg/dL — ABNORMAL HIGH (ref 0.44–1.00)
GFR calc Af Amer: 33 mL/min — ABNORMAL LOW (ref >60)
GFR calc non Af Amer: 28 mL/min — ABNORMAL LOW (ref >60)
Glucose, Bld: 114 mg/dL — ABNORMAL HIGH (ref 70–99)
Potassium: 4.6 mmol/L (ref 3.5–5.1)
Sodium: 138 mmol/L (ref 135–145)

## 2018-10-15 LAB — BRAIN NATRIURETIC PEPTIDE: B Natriuretic Peptide: 690.8 pg/mL — ABNORMAL HIGH (ref 0.0–100.0)

## 2018-10-20 ENCOUNTER — Encounter (HOSPITAL_COMMUNITY): Payer: Self-pay | Admitting: *Deleted

## 2018-11-03 ENCOUNTER — Encounter: Payer: Medicare Other | Admitting: Family Medicine

## 2019-01-12 NOTE — Progress Notes (Signed)
[facility]  Telephone:([phone]) [phone] Fax:(336) [phone]     ID: [name] DOB: 04-28-1935  MR#: [id number]  JYN#:[id number]  Patient Care Team: Sherren Mocha, MD as PCP - General (Family Medicine) Bensimhon, Bevelyn Buckles, MD as PCP - Advanced Heart Failure (Cardiology) [name], MD as Consulting Physician (Gastroenterology) Rula Keniston, [name], MD as Consulting Physician (Oncology) [name], MD as Consulting Physician (Cardiology) OTHER MD:  CHIEF COMPLAINT: Estrogen receptor positive breast cancer  CURRENT TREATMENT: Anastrozole, denosumab/Prolia   INTERVAL HISTORY: [name] returns today for follow-up of her estrogen receptor positive breast cancer.   She continues on anastrozole, with good tolerance. She denies issues with hot flashes. Vaginal dryness is not a major issue.   She receives denosumab/Prolia every 6 months with a dose due today. She tolerates this well without any complications.   Since her last visit, she underwent bilateral diagnostic mammography with tomography at [facility] on 02/14/2018 showing: breast density category B; no evidence of malignancy in either breast; stable large right hamartoma.  Her most recent bone density screening was on 09/28/2016 and showed a T-score of -3.3.  Recall she had a heart attack in February 2019. Her most recent echocardiogram was on 12/23/2017 and showed an ejection fraction of 45-50%.   REVIEW OF SYSTEMS: [name] reports her breathing is overall better. She has had breathing issues since her heart attack last year. She notes it's better with the warmer weather. She lives with her husband and her brother. For grocery shopping, they mostly order online and her husband picks it up. She denies any changes to either breast. A detailed review of systems was otherwise stable.    BREAST CANCER HISTORY: From the original intake note:  [name] noted a palpable mass in the left breast and brought it to her primary  care physician's attention. On 09/27/2014 the patient underwent bilateral diagnostic mammography with tomosynthesis and left breast ultrasonography. The breast density was category B. In the outer left breast area there was an irregularly shaped mass not associated with suspicious microcalcifications or distortion. The previously described large of fibroadenolipoma in the right breast was unchanged. On physical exam there was a firm palpable mass at the 3:30 position in the left breast 2 cm prone the nipple which, on targeted ultrasound, was irregular, hypoechoic, and measured 2.0 cm. Ultrasound of the left axilla was negative.  Biopsy of this mass 10/05/2014 showed ([id number]) an invasive ductal carcinoma him a grade 1, estrogen receptor 90% positive, progesterone receptor 90% positive, both with strong staining intensity, with an MIB-1 of 5%, and no HER-2 amplification, the signals ratio being 1.06 and the number per cell 1.70.  The patient's subsequent history is as detailed below   PAST MEDICAL HISTORY: Past Medical History:  Diagnosis Date  . Anxiety   . Breast cancer of lower-outer quadrant of left female breast (HCC) 10/11/2014   treated with lumpectomy and radiation  . Coronary artery disease 07/27/2017  . GERD (gastroesophageal reflux disease)   . Hypertension   . Myocardial infarction (HCC) 07/27/2017   STEMI of anterolateral wall. Cath w/ stent and cabg by Dr. Swaziland  . Osteoporosis 10/20/2014  . Personal history of radiation therapy 2016   treatment of breast cancer  . PONV (postoperative nausea and vomiting)   . PUD (peptic ulcer disease)   . Pure hypercholesterolemia 04/08/2014  . Stone, kidney     PAST SURGICAL HISTORY: Past Surgical History:  Procedure Laterality Date  . BREAST  LUMPECTOMY Left 2016  . BREAST LUMPECTOMY WITH SENTINEL LYMPH NODE BIOPSY Left 10/22/2014   Procedure: BREAST LUMPECTOMY WITH SENTINEL LYMPH NODE BIOPSY;  Surgeon: Chevis Pretty III, MD;  Location:  [facility];  Service: General;  Laterality: Left;  . CARDIOVERSION N/A 06/13/2018   Procedure: CARDIOVERSION;  Surgeon: Dolores Patty, MD;  Location: [facility] ENDOSCOPY;  Service: Cardiovascular;  Laterality: N/A;  . CORONARY STENT INTERVENTION N/A 07/27/2017   Procedure: CORONARY STENT INTERVENTION;  Surgeon: Swaziland, Peter M, MD;  Location: [facility] INVASIVE CV LAB;  Service: Cardiovascular;  Laterality: N/A;  . CORONARY/GRAFT ACUTE MI REVASCULARIZATION N/A 07/27/2017   Procedure: Coronary/Graft Acute MI Revascularization;  Surgeon: Swaziland, Peter M, MD;  Location: [facility] INVASIVE CV LAB;  Service: Cardiovascular;  Laterality: N/A;  . LEFT HEART CATH AND CORONARY ANGIOGRAPHY N/A 07/27/2017   Procedure: LEFT HEART CATH AND CORONARY ANGIOGRAPHY;  Surgeon: Swaziland, Peter M, MD;  Location: [facility] INVASIVE CV LAB;  Service: Cardiovascular;  Laterality: N/A;  . RE-EXCISION OF BREAST CANCER,SUPERIOR MARGINS Left 11/04/2014   Procedure: RE-EXCISION OF LEFT BREAST INFERIOR MARGINS;  Surgeon: Chevis Pretty III, MD;  Location: [facility] OR;  Service: General;  Laterality: Left;    FAMILY HISTORY Family History  Problem Relation Age of Onset  . Pancreatic cancer Mother        pancreastic  . Stroke Father   . Hypertension Brother   . Hypertension Sister   . Hypertension Brother   . Heart disease Brother   . Hypertension Sister    the patient's father died following a stroke at age 49. The patient's mother died from pancreatic cancer the age of 32. The patient had 5 brothers, 2 sisters. There is no history of breast or ovarian cancer in the family to the patient's knowledge   GYNECOLOGIC HISTORY:  No LMP recorded. Patient is postmenopausal.  menarche age 71, first live birth age 31, the patient is GX P4. She went through the change of life in her late 50s. She took hormone replacement for approximately 3 years. She also took oral contraceptives for a few years remotely, with no complications.   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 Her husband [name] is a Scientist, water quality. Daughter [name] lives in  [place], near [place], where she Museum/gallery exhibitions officer. Son [name] lives in [place] and works in Sales promotion account executive. Son [name] also lives in [place]. He is a Armed forces technical officer with [employer]. Daughter [name] lives in [place] and teaches in [place] [employer] [address] (English and [place]). The patient has 11 grandchildren. She attends a local [employer]     ADVANCED DIRECTIVES:  the patient has named her son [name] as healthcare power of attorney. He can be reached at [phone].    HEALTH MAINTENANCE: Social History   Tobacco Use  . Smoking status: Never Smoker  . Smokeless tobacco: Never Used  Substance Use Topics  . Alcohol use: No  . Drug use: No     Colonoscopy: Due 2019?  PAP:  Bone density:April 2018   Allergies  Allergen Reactions  . Lisinopril Cough    Current Outpatient Medications  Medication Sig Dispense Refill  . anastrozole (ARIMIDEX) 1 MG tablet Take 1 tablet (1 mg total) by mouth daily. 90 tablet 4  . apixaban (ELIQUIS) 2.5 MG TABS tablet Take 1 tablet (2.5 mg total) by mouth 2 (two) times daily. 180 tablet 3  . atorvastatin (LIPITOR) 80 MG tablet TAKE  1 TABLET BY MOUTH ONCE DAILY AT  6PM 90 tablet 0  . Calcium Carb-Cholecalciferol (CALCIUM + D3) 600-800 MG-UNIT TABS Take 1 tablet by mouth every evening.     . Cholecalciferol (VITAMIN D-3) 125 MCG (5000 UT) TABS Take 5,000 Units by mouth every morning. 90 tablet 4  . clopidogrel (PLAVIX) 75 MG tablet TAKE 1 TABLET BY MOUTH ONCE DAILY 90 tablet 1  . clotrimazole (MYCELEX) 10 MG troche Take 1 tablet (10 mg total) by mouth 5 (five) times daily. Slowly dissolve in mouth, do not chew or swallow whole. Use ALL lozenges 70 tablet 0  . co-enzyme Q-10 30 MG capsule Take 30 mg by mouth 3 (three) times daily.    Marland Kitchen Fe Bisgly-Vit C-Vit B12-FA (GENTLE  IRON PO) Take 1-2 tablets by mouth daily.    . fish oil-omega-3 fatty acids 1000 MG capsule Take 1 g by mouth at bedtime.     . furosemide (LASIX) 20 MG tablet Take 20 mg by mouth as needed for fluid.    Marland Kitchen losartan (COZAAR) 25 MG tablet Take 1 tablet (25 mg total) by mouth 2 (two) times daily. 180 tablet 3  . Multiple Vitamin (MULTIVITAMIN WITH MINERALS) TABS tablet Take 1 tablet by mouth daily.    . nitroGLYCERIN (NITROSTAT) 0.3 MG SL tablet Place 1 tablet (0.3 mg total) under the tongue every 5 (five) minutes as needed for chest pain. 90 tablet 12  . spironolactone (ALDACTONE) 25 MG tablet Take 1 tablet by mouth once daily 30 tablet 5   No current facility-administered medications for this visit.     OBJECTIVE: older African-American woman in no acute distress  Vitals:   01/13/19 1404  BP: 119/71  Pulse: 80  Resp: 18  Temp: 98.5 F (36.9 C)  SpO2: 100%     Body mass index is 20.57 kg/m.    ECOG FS:1 - Symptomatic but completely ambulatory  Sclerae unicteric, EOMs intact Wearing a mask No cervical or supraclavicular adenopathy Lungs no rales or rhonchi Heart regular rate and rhythm Abd soft, nontender, positive bowel sounds MSK no focal spinal tenderness, no upper extremity lymphedema Neuro: nonfocal, well oriented, appropriate affect Breasts: As noted before, the right breast is lumpy.  This is unchanged from baseline to my recollection.  The left breast is considerably smaller than the right.  It is otherwise unremarkable.  There is no evidence of disease recurrence.  Both axillae are benign.   LAB RESULTS:  CMP     Component Value Date/Time   NA 138 10/15/2018 0829   NA 146 (H) 05/15/2018 1002   NA 143 05/21/2017 0959   K 4.6 10/15/2018 0829   K 4.0 05/21/2017 0959   CL 109 10/15/2018 0829   CO2 22 10/15/2018 0829   CO2 26 05/21/2017 0959   GLUCOSE 114 (H) 10/15/2018 0829   GLUCOSE 86 05/21/2017 0959   BUN 40 (H) 10/15/2018 0829   BUN 39 (H) 05/15/2018 1002    BUN 16.7 05/21/2017 0959   CREATININE 1.67 (H) 10/15/2018 0829   CREATININE 1.1 05/21/2017 0959   CALCIUM 10.7 (H) 10/15/2018 0829   CALCIUM 10.3 05/21/2017 0959   PROT 7.2 05/15/2018 1002   PROT 6.9 05/21/2017 0959   ALBUMIN 4.7 05/15/2018 1002   ALBUMIN 3.7 05/21/2017 0959   AST 33 05/15/2018 1002   AST 18 05/21/2017 0959   ALT 40 (H) 05/15/2018 1002   ALT 14 05/21/2017 0959   ALKPHOS 46 05/15/2018 1002   ALKPHOS 42 05/21/2017 0959  BILITOT 0.7 05/15/2018 1002   BILITOT 0.61 05/21/2017 0959   GFRNONAA 28 (L) 10/15/2018 0829   GFRNONAA 63 09/09/2014 1150   GFRAA 33 (L) 10/15/2018 0829   GFRAA 72 09/09/2014 1150    INo results found for: SPEP, UPEP  Lab Results  Component Value Date   WBC 5.1 01/13/2019   NEUTROABS 3.8 01/13/2019   HGB 10.2 (L) 01/13/2019   HCT 32.4 (L) 01/13/2019   MCV 95.3 01/13/2019   PLT 185 01/13/2019      Chemistry      Component Value Date/Time   NA 138 10/15/2018 0829   NA 146 (H) 05/15/2018 1002   NA 143 05/21/2017 0959   K 4.6 10/15/2018 0829   K 4.0 05/21/2017 0959   CL 109 10/15/2018 0829   CO2 22 10/15/2018 0829   CO2 26 05/21/2017 0959   BUN 40 (H) 10/15/2018 0829   BUN 39 (H) 05/15/2018 1002   BUN 16.7 05/21/2017 0959   CREATININE 1.67 (H) 10/15/2018 0829   CREATININE 1.1 05/21/2017 0959      Component Value Date/Time   CALCIUM 10.7 (H) 10/15/2018 0829   CALCIUM 10.3 05/21/2017 0959   ALKPHOS 46 05/15/2018 1002   ALKPHOS 42 05/21/2017 0959   AST 33 05/15/2018 1002   AST 18 05/21/2017 0959   ALT 40 (H) 05/15/2018 1002   ALT 14 05/21/2017 0959   BILITOT 0.7 05/15/2018 1002   BILITOT 0.61 05/21/2017 0959       No results found for: LABCA2  No components found for: [id number]  No results for input(s): INR in the last 168 hours.  Urinalysis    Component Value Date/Time   COLORURINE AMBER (A) 09/04/2017 2045   APPEARANCEUR CLOUDY (A) 09/04/2017 2045   LABSPEC 1.009 09/04/2017 2045   PHURINE 6.0 09/04/2017 2045    GLUCOSEU NEGATIVE 09/04/2017 2045   HGBUR LARGE (A) 09/04/2017 2045   BILIRUBINUR negative 05/15/2018 1032   BILIRUBINUR neg 09/14/2014 1308   KETONESUR negative 05/15/2018 1032   KETONESUR NEGATIVE 09/04/2017 2045   PROTEINUR negative 05/15/2018 1032   PROTEINUR 100 (A) 09/04/2017 2045   UROBILINOGEN 0.2 05/15/2018 1032   UROBILINOGEN 0.2 02/01/2011 1158   NITRITE Negative 05/15/2018 1032   NITRITE NEGATIVE 09/04/2017 2045   LEUKOCYTESUR Trace (A) 05/15/2018 1032    STUDIES: No results found.   ASSESSMENT: 84 y.o.  [place] woman status post left breast lower outer quadrant biopsy 10/05/2014 for a clinical T1 N0, stage IA invasive ductal breast cancer, grade 1, estrogen and progesterone receptor positive, HER-2 not amplified, with an MIB-1 of 5%   (1) left lumpectomy and sentinel lymph node sampling 10/22/2014 showed a pT1c pN0, stage IA mixed ductal and mucinous invasive carcinoma, grade 2, repeat HER-2 again negative  (a) positive margins were cleared with subsequent surgery 11/04/2014.  (2) radiation 12/21/2014-01/11/2015: Left Breast / 42.56 Gy in 16 fractions   (3) anastrozole started September 2016  (a) bone density 09/27/2014 shows osteoporosis  (b) denosumab/Prolia started 05/19/2015, repeated every 6 months.  (c) repeat bone density 09/28/2016 showed a T score of -3.3; this was stable  PLAN: Kaydence is now a little over 4 years out from definitive surgery for her breast cancer with no evidence of disease recurrence.  This is favorable.  She is tolerating the anastrozole well and the plan is to continue that an additional year.  She is due for mammography in September and I have entered that order.  I have tried to update her medication  list as best as I could based on her recollection.  She will follow-up with her cardiologist regarding her cardiac meds  She will see me one last time a year from now at which time she will be ready to "graduate".  She knows to call  for any other issue that may develop before then. Jayelle Page, [name], MD  01/13/19 2:30 PM Medical Oncology and Hematology [facility] [address] [place], [place] [address] Tel. [phone]    Fax. [phone]   I, Mickie Bail, am acting as scribe for Dr. [name]. Theone Bowell.  I, Ruthann Cancer MD, have reviewed the above documentation for accuracy and completeness, and I agree with the above.

## 2019-01-13 ENCOUNTER — Inpatient Hospital Stay (HOSPITAL_BASED_OUTPATIENT_CLINIC_OR_DEPARTMENT_OTHER): Payer: Medicare Other | Admitting: Oncology

## 2019-01-13 ENCOUNTER — Ambulatory Visit: Payer: Self-pay

## 2019-01-13 ENCOUNTER — Inpatient Hospital Stay: Payer: Medicare Other | Attending: Oncology

## 2019-01-13 ENCOUNTER — Inpatient Hospital Stay: Payer: Medicare Other

## 2019-01-13 ENCOUNTER — Other Ambulatory Visit: Payer: Self-pay

## 2019-01-13 VITALS — BP 119/71 | HR 80 | Temp 98.5°F | Resp 18 | Ht 68.5 in | Wt 137.3 lb

## 2019-01-13 DIAGNOSIS — C50512 Malignant neoplasm of lower-outer quadrant of left female breast: Secondary | ICD-10-CM | POA: Diagnosis not present

## 2019-01-13 DIAGNOSIS — Z170 Estrogen receptor positive status [ER+]: Secondary | ICD-10-CM

## 2019-01-13 DIAGNOSIS — M810 Age-related osteoporosis without current pathological fracture: Secondary | ICD-10-CM | POA: Insufficient documentation

## 2019-01-13 DIAGNOSIS — M818 Other osteoporosis without current pathological fracture: Secondary | ICD-10-CM

## 2019-01-13 LAB — CBC WITH DIFFERENTIAL/PLATELET
Abs Immature Granulocytes: 0.03 10*3/uL (ref 0.00–0.07)
Basophils Absolute: 0 10*3/uL (ref 0.0–0.1)
Basophils Relative: 1 %
Eosinophils Absolute: 0.1 10*3/uL (ref 0.0–0.5)
Eosinophils Relative: 2 %
HCT: 32.4 % — ABNORMAL LOW (ref 36.0–46.0)
Hemoglobin: 10.2 g/dL — ABNORMAL LOW (ref 12.0–15.0)
Immature Granulocytes: 1 %
Lymphocytes Relative: 13 %
Lymphs Abs: 0.7 10*3/uL (ref 0.7–4.0)
MCH: 30 pg (ref 26.0–34.0)
MCHC: 31.5 g/dL (ref 30.0–36.0)
MCV: 95.3 fL (ref 80.0–100.0)
Monocytes Absolute: 0.5 10*3/uL (ref 0.1–1.0)
Monocytes Relative: 9 %
Neutro Abs: 3.8 10*3/uL (ref 1.7–7.7)
Neutrophils Relative %: 74 %
Platelets: 185 10*3/uL (ref 150–400)
RBC: 3.4 MIL/uL — ABNORMAL LOW (ref 3.87–5.11)
RDW: 13.9 % (ref 11.5–15.5)
WBC: 5.1 10*3/uL (ref 4.0–10.5)
nRBC: 0 % (ref 0.0–0.2)

## 2019-01-13 LAB — COMPREHENSIVE METABOLIC PANEL
ALT: 33 U/L (ref 0–44)
AST: 25 U/L (ref 15–41)
Albumin: 4 g/dL (ref 3.5–5.0)
Alkaline Phosphatase: 44 U/L (ref 38–126)
Anion gap: 9 (ref 5–15)
BUN: 44 mg/dL — ABNORMAL HIGH (ref 8–23)
CO2: 21 mmol/L — ABNORMAL LOW (ref 22–32)
Calcium: 11 mg/dL — ABNORMAL HIGH (ref 8.9–10.3)
Chloride: 108 mmol/L (ref 98–111)
Creatinine, Ser: 1.87 mg/dL — ABNORMAL HIGH (ref 0.44–1.00)
GFR calc Af Amer: 29 mL/min — ABNORMAL LOW (ref >60)
GFR calc non Af Amer: 25 mL/min — ABNORMAL LOW (ref >60)
Glucose, Bld: 121 mg/dL — ABNORMAL HIGH (ref 70–99)
Potassium: 4.7 mmol/L (ref 3.5–5.1)
Sodium: 138 mmol/L (ref 135–145)
Total Bilirubin: 1 mg/dL (ref 0.3–1.2)
Total Protein: 7.3 g/dL (ref 6.5–8.1)

## 2019-01-13 MED ORDER — VITAMIN D-3 125 MCG (5000 UT) PO TABS: 5000.0000 [IU] | ORAL_TABLET | Freq: Every morning | ORAL | 4 refills | Status: DC

## 2019-01-13 MED ORDER — DENOSUMAB 60 MG/ML ~~LOC~~ SOSY
PREFILLED_SYRINGE | SUBCUTANEOUS | Status: AC
  Filled 2019-01-13: qty 1

## 2019-01-13 MED ORDER — ANASTROZOLE 1 MG PO TABS: 1.0000 mg | ORAL_TABLET | Freq: Every day | ORAL | 4 refills | Status: DC

## 2019-01-13 MED ORDER — DENOSUMAB 60 MG/ML ~~LOC~~ SOSY
60.0000 mg | PREFILLED_SYRINGE | Freq: Once | SUBCUTANEOUS | Status: AC
  Administered 2019-01-13: 60 mg via SUBCUTANEOUS

## 2019-01-13 NOTE — Patient Instructions (Signed)
Denosumab injection What is this medicine? DENOSUMAB (den oh sue mab) slows bone breakdown. Prolia is used to treat osteoporosis in women after menopause and in men, and in people who are taking corticosteroids for 6 months or more. Xgeva is used to treat a high calcium level due to cancer and to prevent bone fractures and other bone problems caused by multiple myeloma or cancer bone metastases. Xgeva is also used to treat giant cell tumor of the bone. This medicine may be used for other purposes; ask your health care provider or pharmacist if you have questions. COMMON BRAND NAME(S): Prolia, XGEVA What should I tell my health care provider before I take this medicine? They need to know if you have any of these conditions:  dental disease  having surgery or tooth extraction  infection  kidney disease  low levels of calcium or Vitamin D in the blood  malnutrition  on hemodialysis  skin conditions or sensitivity  thyroid or parathyroid disease  an unusual reaction to denosumab, other medicines, foods, dyes, or preservatives  pregnant or trying to get pregnant  breast-feeding How should I use this medicine? This medicine is for injection under the skin. It is given by a health care professional in a hospital or clinic setting. A special MedGuide will be given to you before each treatment. Be sure to read this information carefully each time. For Prolia, talk to your pediatrician regarding the use of this medicine in children. Special care may be needed. For Xgeva, talk to your pediatrician regarding the use of this medicine in children. While this drug may be prescribed for children as young as 13 years for selected conditions, precautions do apply. Overdosage: If you think you have taken too much of this medicine contact a poison control center or emergency room at once. NOTE: This medicine is only for you. Do not share this medicine with others. What if I miss a dose? It is  important not to miss your dose. Call your doctor or health care professional if you are unable to keep an appointment. What may interact with this medicine? Do not take this medicine with any of the following medications:  other medicines containing denosumab This medicine may also interact with the following medications:  medicines that lower your chance of fighting infection  steroid medicines like prednisone or cortisone This list may not describe all possible interactions. Give your health care provider a list of all the medicines, herbs, non-prescription drugs, or dietary supplements you use. Also tell them if you smoke, drink alcohol, or use illegal drugs. Some items may interact with your medicine. What should I watch for while using this medicine? Visit your doctor or health care professional for regular checks on your progress. Your doctor or health care professional may order blood tests and other tests to see how you are doing. Call your doctor or health care professional for advice if you get a fever, chills or sore throat, or other symptoms of a cold or flu. Do not treat yourself. This drug may decrease your body's ability to fight infection. Try to avoid being around people who are sick. You should make sure you get enough calcium and vitamin D while you are taking this medicine, unless your doctor tells you not to. Discuss the foods you eat and the vitamins you take with your health care professional. See your dentist regularly. Brush and floss your teeth as directed. Before you have any dental work done, tell your dentist you are   receiving this medicine. Do not become pregnant while taking this medicine or for 5 months after stopping it. Talk with your doctor or health care professional about your birth control options while taking this medicine. Women should inform their doctor if they wish to become pregnant or think they might be pregnant. There is a potential for serious side  effects to an unborn child. Talk to your health care professional or pharmacist for more information. What side effects may I notice from receiving this medicine? Side effects that you should report to your doctor or health care professional as soon as possible:  allergic reactions like skin rash, itching or hives, swelling of the face, lips, or tongue  bone pain  breathing problems  dizziness  jaw pain, especially after dental work  redness, blistering, peeling of the skin  signs and symptoms of infection like fever or chills; cough; sore throat; pain or trouble passing urine  signs of low calcium like fast heartbeat, muscle cramps or muscle pain; pain, tingling, numbness in the hands or feet; seizures  unusual bleeding or bruising  unusually weak or tired Side effects that usually do not require medical attention (report to your doctor or health care professional if they continue or are bothersome):  constipation  diarrhea  headache  joint pain  loss of appetite  muscle pain  runny nose  tiredness  upset stomach This list may not describe all possible side effects. Call your doctor for medical advice about side effects. You may report side effects to FDA at 1-800-FDA-1088. Where should I keep my medicine? This medicine is only given in a clinic, doctor's office, or other health care setting and will not be stored at home. NOTE: This sheet is a summary. It may not cover all possible information. If you have questions about this medicine, talk to your doctor, pharmacist, or health care provider.  2020 Elsevier/Gold Standard (2017-09-27 16:10:44)

## 2019-01-14 ENCOUNTER — Telehealth: Payer: Self-pay | Admitting: Oncology

## 2019-01-14 NOTE — Telephone Encounter (Signed)
I could not reach regarding schedule  °

## 2019-01-20 ENCOUNTER — Encounter: Payer: Self-pay | Admitting: Family Medicine

## 2019-01-20 ENCOUNTER — Ambulatory Visit (INDEPENDENT_AMBULATORY_CARE_PROVIDER_SITE_OTHER): Payer: Medicare Other | Admitting: Family Medicine

## 2019-01-20 ENCOUNTER — Other Ambulatory Visit: Payer: Self-pay

## 2019-01-20 VITALS — BP 125/76 | HR 94 | Temp 97.4°F | Resp 16 | Ht 68.5 in | Wt 137.6 lb

## 2019-01-20 DIAGNOSIS — M810 Age-related osteoporosis without current pathological fracture: Secondary | ICD-10-CM

## 2019-01-20 DIAGNOSIS — E2839 Other primary ovarian failure: Secondary | ICD-10-CM | POA: Diagnosis not present

## 2019-01-20 DIAGNOSIS — Z0000 Encounter for general adult medical examination without abnormal findings: Secondary | ICD-10-CM

## 2019-01-20 DIAGNOSIS — Z0001 Encounter for general adult medical examination with abnormal findings: Secondary | ICD-10-CM

## 2019-01-20 NOTE — Patient Instructions (Addendum)
To Do List:  1.  Call the [facility] for Mammogram and Bone Density 2.  Follow up with  Carddiology September 3.  Continue to avoid medications like ibuprofen, alleve or naproxen due to your kidneys   Please contact a local imaging center to schedule your mammogram and bone density.  [facility] ([place]) - [phone] or [phone]      If you have lab work done today you will be contacted with your lab results within the next 2 weeks.  If you have not heard from [place] then please contact us. The fastest way to get your results is to register for My Chart.   IF you received an x-ray today, you will receive an invoice from [facility]. Please contact [facility] at [phone] with questions or concerns regarding your invoice.   IF you received labwork today, you will receive an invoice from [facility]. Please contact [facility] at [phone] with questions or concerns regarding your invoice.   Our billing staff will not be able to assist you with questions regarding bills from these companies.  You will be contacted with the lab results as soon as they are available. The fastest way to get your results is to activate your My Chart account. Instructions are located on the last page of this paperwork. If you have not heard from [place] regarding the results in 2 weeks, please contact this office.     Chronic Kidney Disease, Adult Chronic kidney disease (CKD) occurs when the kidneys become damaged slowly over a long period of time. The kidneys are a pair of organs that do many important jobs in the body, including:  Removing waste and extra fluid from the blood to make urine.  Making hormones that maintain the amount of fluid in tissues and blood vessels.  Maintaining the right amount of fluids and chemicals in the body. A small amount of kidney damage may not cause problems, but a large amount of damage may make it hard or impossible for the  kidneys to work the way they should. If steps are not taken to slow down kidney damage or to stop it from getting worse, the kidneys may stop working permanently (end-stage renal disease or ESRD). Most of the time, CKD does not go away, but it can often be controlled. People who have CKD are usually able to live normal lives. What are the causes? The most common causes of this condition are diabetes and high blood pressure (hypertension). Other causes include:  Heart and blood vessel (cardiovascular) disease.  Kidney diseases, such as: ? Glomerulonephritis. ? Interstitial nephritis. ? Polycystic kidney disease. ? Renal vascular disease.  Diseases that affect the immune system.  Genetic diseases.  Medicines that damage the kidneys, such as anti-inflammatory medicines.  Being around or being in contact with poisonous (toxic) substances.  A kidney or urinary infection that occurs again and again (recurs).  Vasculitis. This is swelling or inflammation of the blood vessels.  A problem with urine flow that may be caused by: ? Cancer. ? Having kidney stones more than one time. ? An enlarged prostate, in males. What increases the risk? You are more likely to develop this condition if you:  Are older than age 57.  Are female.  Are African-American, Hispanic, Asian, [place], or American [place].  Are a current or former smoker.  Are obese.  Have a family history of kidney disease or failure.  Often take medicines that are damaging to the kidneys.  What are the signs or symptoms? Symptoms of this condition include:  Swelling (edema) of the face, legs, ankles, or feet.  Tiredness (lethargy) and having less energy.  Nausea or vomiting.  Confusion or trouble concentrating.  Problems with urination, such as: ? Painful or burning feeling during urination. ? Decreased urine production. ? Frequent urination, especially at night. ? Bloody urine.  Muscle twitches and  cramps, especially in the legs.  Shortness of breath.  Weakness.  Loss of appetite.  Metallic taste in the mouth.  Trouble sleeping.  Dry, itchy skin.  A low blood count (anemia).  Pale lining of the eyelids and surface of the eye (conjunctiva). Symptoms develop slowly and may not be obvious until the kidney damage becomes severe. It is possible to have kidney disease for years without having any symptoms. How is this diagnosed? This condition may be diagnosed based on:  Blood tests.  Urine tests.  Imaging tests, such as an ultrasound or CT scan.  A test in which a sample of tissue is removed from the kidneys to be examined under a microscope (kidney biopsy). These test results will help your health care provider determine how serious the CKD is. How is this treated? There is no cure for most cases of this condition, but treatment usually relieves symptoms and prevents or slows the progression of the disease. Treatment may include:  Making diet changes, which may require you to avoid alcohol, salty foods (sodium), and foods that are high in potassium, calcium, and protein.  Medicines: ? To lower blood pressure. ? To control blood glucose. ? To relieve anemia. ? To relieve swelling. ? To protect your bones. ? To improve the balance of electrolytes in your blood.  Removing toxic waste from the body through types of dialysis, if the kidneys can no longer do their job (kidney failure).  Managing any other conditions that are causing your CKD or making it worse. Follow these instructions at home: Medicines  Take over-the-counter and prescription medicines only as told by your health care provider. The dose of some medicines that you take may need to be adjusted.  Do not take any new medicines unless approved by your health care provider. Many medicines can worsen your kidney damage.  Do not take any vitamin and mineral supplements unless approved by your health care  provider. Many nutritional supplements can worsen your kidney damage. General instructions  Follow your prescribed diet as told by your health care provider.  Do not use any products that contain nicotine or tobacco, such as cigarettes and e-cigarettes. If you need help quitting, ask your health care provider.  Monitor and track your blood pressure at home. Report changes in your blood pressure as told by your health care provider.  If you are being treated for diabetes, monitor and track your blood sugar (blood glucose) levels as told by your health care provider.  Maintain a healthy weight. If you need help with this, ask your health care provider.  Start or continue an exercise plan. Exercise at least 30 minutes a day, 5 days a week.  Keep your immunizations up to date as told by your health care provider.  Keep all follow-up visits as told by your health care provider. This is important. Where to find more information  American Association of Kidney Patients: BombTimer.gl  National Kidney Foundation: www.kidney.[place]: https://mathis.com/  [facility]: www.lifeoptions.org and www.kidneyschool.org Contact a health care provider if:  Your symptoms get worse.  You develop new symptoms. Get help right away if:  You develop symptoms of ESRD, which include: ? Headaches. ? Numbness in the hands or feet. ? Easy bruising. ? Frequent hiccups. ? Chest pain. ? Shortness of breath. ? Lack of menstruation, in women.  You have a fever.  You have decreased urine production.  You have pain or bleeding when you urinate. Summary  Chronic kidney disease (CKD) occurs when the kidneys become damaged slowly over a long period of time.  The most common causes of this condition are diabetes and high blood pressure (hypertension).  There is no cure for most cases of this condition, but treatment usually relieves symptoms and prevents or slows the  progression of the disease. Treatment may include a combination of medicines and lifestyle changes. This information is not intended to replace advice given to you by your health care provider. Make sure you discuss any questions you have with your health care provider. Document Released: 02/28/2008 Document Revised: 05/03/2017 Document Reviewed: 06/28/2016 Elsevier Patient Education  2020 [employer].

## 2019-01-20 NOTE — Progress Notes (Signed)
QUICK REFERENCE INFORMATION: The ABCs of Providing the Annual Wellness Visit  CMS.gov Medicare Learning Network  [facility] Wellness Visit  Subjective:   [name] is a 84 y.o. Female who presents for an Annual Wellness Visit.    Patient Active Problem List   Diagnosis Date Noted  . Chronic idiopathic constipation 12/05/2017  . Asymptomatic microscopic hematuria 12/05/2017  . Unintentional weight loss 12/05/2017  . Chronic kidney disease, stage IV (severe) ([place]) 12/05/2017  . Anemia associated with chronic renal failure 12/05/2017  . Hypercalcemia 12/05/2017  . Acute systolic heart failure ([place])   . A-fib ([place])   . Pressure injury of skin 07/28/2017  . Acute ST elevation myocardial infarction (STEMI) of anterolateral wall ([place]) 07/27/2017  . Osteoporosis 10/20/2014  . Malignant neoplasm of lower-outer quadrant of left breast of female, estrogen receptor positive ([place]) 10/11/2014  . Pure hypercholesterolemia 04/08/2014  . Anxiety   . PUD (peptic ulcer disease)   . HTN (hypertension), benign 06/29/2011  . GERD (gastroesophageal reflux disease) 06/29/2011    Past Medical History:  Diagnosis Date  . Anxiety   . Breast cancer of lower-outer quadrant of left female breast ([place]) 10/11/2014   treated with lumpectomy and radiation  . Coronary artery disease 07/27/2017  . GERD (gastroesophageal reflux disease)   . Hypertension   . Myocardial infarction ([place]) 07/27/2017   STEMI of anterolateral wall. Cath w/ stent and cabg by Dr. Martinique  . Osteoporosis 10/20/2014  . Personal history of radiation therapy 2016   treatment of breast cancer  . PONV (postoperative nausea and vomiting)   . PUD (peptic ulcer disease)   . Pure hypercholesterolemia 04/08/2014  . Stone, kidney      Past Surgical History:  Procedure Laterality Date  . BREAST LUMPECTOMY Left 2016  . BREAST LUMPECTOMY WITH SENTINEL LYMPH NODE BIOPSY Left 10/22/2014   Procedure: BREAST LUMPECTOMY WITH SENTINEL  LYMPH NODE BIOPSY;  Surgeon: Autumn Messing III, MD;  Location: [place];  Service: General;  Laterality: Left;  . CARDIOVERSION N/A 06/13/2018   Procedure: CARDIOVERSION;  Surgeon: Jolaine Artist, MD;  Location: [facility] ENDOSCOPY;  Service: Cardiovascular;  Laterality: N/A;  . CORONARY STENT INTERVENTION N/A 07/27/2017   Procedure: CORONARY STENT INTERVENTION;  Surgeon: Martinique, Peter M, MD;  Location: [place] CV LAB;  Service: Cardiovascular;  Laterality: N/A;  . CORONARY/GRAFT ACUTE MI REVASCULARIZATION N/A 07/27/2017   Procedure: Coronary/Graft Acute MI Revascularization;  Surgeon: Martinique, Peter M, MD;  Location: [place] CV LAB;  Service: Cardiovascular;  Laterality: N/A;  . LEFT HEART CATH AND CORONARY ANGIOGRAPHY N/A 07/27/2017   Procedure: LEFT HEART CATH AND CORONARY ANGIOGRAPHY;  Surgeon: Martinique, Peter M, MD;  Location: [place] CV LAB;  Service: Cardiovascular;  Laterality: N/A;  . RE-EXCISION OF BREAST CANCER,SUPERIOR MARGINS Left 11/04/2014   Procedure: RE-EXCISION OF LEFT BREAST INFERIOR MARGINS;  Surgeon: Autumn Messing III, MD;  Location: [place];  Service: General;  Laterality: Left;     Outpatient Medications Prior to Visit  Medication Sig Dispense Refill  . anastrozole (ARIMIDEX) 1 MG tablet Take 1 tablet (1 mg total) by mouth daily. 90 tablet 4  . apixaban (ELIQUIS) 2.5 MG TABS tablet Take 1 tablet (2.5 mg total) by mouth 2 (two) times daily. 180 tablet 3  . atorvastatin (LIPITOR) 80 MG tablet TAKE 1 TABLET BY MOUTH ONCE DAILY AT  6PM 90 tablet 0  . Calcium Carb-Cholecalciferol (CALCIUM + D3) 600-800 MG-UNIT TABS Take 1 tablet by mouth every evening.     Marland Kitchen  co-enzyme Q-10 30 MG capsule Take 30 mg by mouth 3 (three) times daily.    Marland Kitchen Fe Bisgly-Vit C-Vit B12-FA (GENTLE IRON PO) Take 1-2 tablets by mouth daily.    . fish oil-omega-3 fatty acids 1000 MG capsule Take 1 g by mouth at bedtime.     . furosemide (LASIX) 20 MG tablet Take 20 mg by mouth as needed for fluid.     Marland Kitchen losartan (COZAAR) 25 MG tablet Take 1 tablet (25 mg total) by mouth 2 (two) times daily. 180 tablet 3  . Multiple Vitamin (MULTIVITAMIN WITH MINERALS) TABS tablet Take 1 tablet by mouth daily.    . nitroGLYCERIN (NITROSTAT) 0.3 MG SL tablet Place 1 tablet (0.3 mg total) under the tongue every 5 (five) minutes as needed for chest pain. 90 tablet 12  . clopidogrel (PLAVIX) 75 MG tablet TAKE 1 TABLET BY MOUTH ONCE DAILY 90 tablet 1  . spironolactone (ALDACTONE) 25 MG tablet Take 1 tablet by mouth once daily 30 tablet 5  . Cholecalciferol (VITAMIN D-3) 125 MCG (5000 UT) TABS Take 5,000 Units by mouth every morning. 90 tablet 4  . clotrimazole (MYCELEX) 10 MG troche Take 1 tablet (10 mg total) by mouth 5 (five) times daily. Slowly dissolve in mouth, do not chew or swallow whole. Use ALL lozenges (Patient not taking: Reported on 01/20/2019) 70 tablet 0   No facility-administered medications prior to visit.     Allergies  Allergen Reactions  . Lisinopril Cough     Family History  Problem Relation Age of Onset  . Pancreatic cancer Mother        pancreastic  . Stroke Father   . Hypertension Brother   . Hypertension Sister   . Hypertension Brother   . Heart disease Brother   . Hypertension Sister      Social History   Socioeconomic History  . Marital status: Married    Spouse name: Not on file  . Number of children: Not on file  . Years of education: Not on file  . Highest education level: Not on file  Occupational History  . Occupation: retired  Scientific laboratory technician  . Financial resource strain: Not on file  . Food insecurity    Worry: Not on file    Inability: Not on file  . Transportation needs    Medical: Not on file    Non-medical: Not on file  Tobacco Use  . Smoking status: Never Smoker  . Smokeless tobacco: Never Used  Substance and Sexual Activity  . Alcohol use: No  . Drug use: No  . Sexual activity: Not on file  Lifestyle  . Physical activity    Days per week: Not  on file    Minutes per session: Not on file  . Stress: Not on file  Relationships  . Social Herbalist on phone: Not on file    Gets together: Not on file    Attends religious service: Not on file    Active member of club or organization: Not on file    Attends meetings of clubs or organizations: Not on file    Relationship status: Not on file  Other Topics Concern  . Not on file  Social History Narrative  . Not on file      Recent Hospitalizations? No  Health Habits: Current exercise activities include: walking   Health Risk Assessment: The patient has completed a Health Risk Assessment. This has been reveiwed with them and has been scanned  into the [facility] system as an attached document.  Current Medical Providers and Suppliers: [facility] Patient Care Team: Forrest Moron, MD as PCP - General (Internal Medicine) Bensimhon, Shaune Pascal, MD as PCP - Advanced Heart Failure (Cardiology) Clarene Essex, MD as Consulting Physician (Gastroenterology) Magrinat, Virgie Dad, MD as Consulting Physician (Oncology) Larey Dresser, MD as Consulting Physician (Cardiology) Future Appointments  Date Time Provider [place]  04/23/2019  8:30 AM GI-[facility]G DIAG TOMO 2 GI-BCGMM GI-BREAST CE  04/23/2019  9:00 AM GI-BCG DX DEXA 1 GI-BCGDG GI-BREAST CE  07/21/2019  9:20 AM Forrest Moron, MD PCP-PCP PEC  03/01/2020  1:30 PM [facility] LAB 1 [facility] None  03/01/2020  2:00 PM Magrinat, Virgie Dad, MD [facility] None     Age-appropriate Screening Schedule: The list below includes current immunization status and future screening recommendations based on patient's age. Orders for these recommended tests are listed in the plan section. The patient has been provided with a written plan. Immunization History  Administered Date(s) Administered  . Hepatitis B 07/15/2004, 08/19/2004, 01/12/2005  . Pneumococcal Conjugate-13 11/05/2016  . Pneumococcal Polysaccharide-23 01/10/2018  . Td  07/15/2004, 09/09/2014      Depression Screen-PHQ2/9 completed today  Depression screen [facility] 2/9 01/20/2019 01/17/2018 01/10/2018 12/03/2017 10/14/2017  Decreased Interest 0 0 0 0 0  Down, Depressed, Hopeless 0 0 0 0 0  PHQ - 2 Score 0 0 0 0 0  Some recent data might be hidden       Depression Severity and Treatment Recommendations:  0-4= None  5-9= Mild / Treatment: Support, educate to call if worse; return in one month  10-14= Moderate / Treatment: Support, watchful waiting; Antidepressant or Psycotherapy  15-19= Moderately severe / Treatment: Antidepressant OR Psychotherapy  >= 20 = Major depression, severe / Antidepressant AND Psychotherapy  Functional Status Survey:      Hearing Evaluation: 1. Do you have trouble hearing the television when others do not?   No 2. Do you have to strain to hear/understand conversations? No   Advanced Care Planning: 1. Patient has executed an Advance Directive: Yes 2. If no, patient was given the opportunity to execute an Advance Directive today? No 3. Are the patient's advanced directives in [place]? No 4. This patient has the ability to prepare an Advance Directive: Yes 5. Provider is willing to follow the patient's wishes: Yes  Cognitive Assessment: Does the patient have evidence of cognitive impairment? No The patient does not have any evidence of any cognitive problems and denies any  change in mood/affect, appearance, speech, memory or motor skills.  Identification of Risk Factors: Risk factors include: atrial fibrillation, hyperlipidemia and hypertension  ROS Review of Systems  Constitutional: Negative for activity change, appetite change, chills and fever.  HENT: Negative for congestion, nosebleeds, trouble swallowing and voice change.   Respiratory: Negative for cough, shortness of breath and wheezing.   Gastrointestinal: Negative for diarrhea, nausea and vomiting.  Genitourinary: Negative for difficulty urinating, dysuria, flank  pain and hematuria.  Musculoskeletal: Negative for back pain, joint swelling and neck pain.  Neurological: Negative for dizziness, speech difficulty, light-headedness and numbness.  See HPI. All other review of systems negative.   Objective:   Vitals:   01/20/19 0943  BP: 125/76  Pulse: 94  Resp: 16  Temp: (!) 97.4 F (36.3 C)  TempSrc: Oral  SpO2: 99%  Weight: 137 lb 9.6 oz (62.4 kg)  Height: 5' 8.5" (1.74 m)    Body mass index is 20.62 kg/m.  Physical  Exam  Constitutional: Oriented to person, place, and time. Appears well-developed and well-nourished.  HENT:  Head: Normocephalic and atraumatic.  Eyes: Conjunctivae and EOM are normal.  Cardiovascular: Normal rate, regular rhythm, normal heart sounds and intact distal pulses.  No murmur heard. Pulmonary/Chest: Effort normal and breath sounds normal. No stridor. No respiratory distress. Has no wheezes.  Neurological: Is alert and oriented to person, place, and time.  Skin: Skin is warm. Capillary refill takes less than 2 seconds.  Psychiatric: Has a normal mood and affect. Behavior is normal. Judgment and thought content normal.     Assessment/Plan:   Patient Self-Management and Personalized Health Advice The patient has been provided with information about:  begin progressive daily aerobic exercise program and use calcium 1 gram daily with Vit D  During the course of the visit the patient was educated and counseled about appropriate screening and preventive services including:   return annually or prn     Body mass index is 20.62 kg/m. Discussed the patient's BMI with her. The BMI BMI is in the acceptable range  [name] was seen today for annual exam.  Diagnoses and all orders for this visit:  Medicare annual wellness visit, subsequent- Women's Health Maintenance Plan Advised monthly breast exam and annual mammogram Advised dental exam every six months Discussed stress management    Age-related osteoporosis  without current pathological fracture -     DG Bone Density; Future  Estrogen deficiency -     DG Bone Density; Future      Return in about 6 months (around 07/23/2019) for a. fib and other chronic medical problems.  Future Appointments  Date Time Provider [place]  04/23/2019  8:30 AM GI-BCG DIAG TOMO 2 GI-BCGMM GI-BREAST CE  04/23/2019  9:00 AM GI-BCG DX DEXA 1 GI-BCGDG GI-BREAST CE  07/21/2019  9:20 AM Forrest Moron, MD PCP-PCP PEC  03/01/2020  1:30 PM [facility] LAB 1 [facility] None  03/01/2020  2:00 PM Magrinat, Virgie Dad, MD [facility] None    Patient Instructions   To Do List:  1.  Call the [facility] for Mammogram and Bone Density 2.  Follow up with  Carddiology September 3.  Continue to avoid medications like ibuprofen, alleve or naproxen due to your kidneys   Please contact a local imaging center to schedule your mammogram and bone density.  [facility] ([place]) - [phone] or [phone]      If you have lab work done today you will be contacted with your lab results within the next 2 weeks.  If you have not heard from [place] then please contact us. The fastest way to get your results is to register for My Chart.   IF you received an x-ray today, you will receive an invoice from [facility]. Please contact [facility] at [phone] with questions or concerns regarding your invoice.   IF you received labwork today, you will receive an invoice from [facility]. Please contact [facility] at [phone] with questions or concerns regarding your invoice.   Our billing staff will not be able to assist you with questions regarding bills from these companies.  You will be contacted with the lab results as soon as they are available. The fastest way to get your results is to activate your My Chart account. Instructions are located on the last page of this paperwork. If you have not heard from [place] regarding the results  in 2 weeks, please contact this office.  Chronic Kidney Disease, Adult Chronic kidney disease (CKD) occurs when the kidneys become damaged slowly over a long period of time. The kidneys are a pair of organs that do many important jobs in the body, including:  Removing waste and extra fluid from the blood to make urine.  Making hormones that maintain the amount of fluid in tissues and blood vessels.  Maintaining the right amount of fluids and chemicals in the body. A small amount of kidney damage may not cause problems, but a large amount of damage may make it hard or impossible for the kidneys to work the way they should. If steps are not taken to slow down kidney damage or to stop it from getting worse, the kidneys may stop working permanently (end-stage renal disease or ESRD). Most of the time, CKD does not go away, but it can often be controlled. People who have CKD are usually able to live normal lives. What are the causes? The most common causes of this condition are diabetes and high blood pressure (hypertension). Other causes include:  Heart and blood vessel (cardiovascular) disease.  Kidney diseases, such as: ? Glomerulonephritis. ? Interstitial nephritis. ? Polycystic kidney disease. ? Renal vascular disease.  Diseases that affect the immune system.  Genetic diseases.  Medicines that damage the kidneys, such as anti-inflammatory medicines.  Being around or being in contact with poisonous (toxic) substances.  A kidney or urinary infection that occurs again and again (recurs).  Vasculitis. This is swelling or inflammation of the blood vessels.  A problem with urine flow that may be caused by: ? Cancer. ? Having kidney stones more than one time. ? An enlarged prostate, in males. What increases the risk? You are more likely to develop this condition if you:  Are older than age 25.  Are female.  Are African-American, Hispanic, Asian, [place], or American  [place].  Are a current or former smoker.  Are obese.  Have a family history of kidney disease or failure.  Often take medicines that are damaging to the kidneys. What are the signs or symptoms? Symptoms of this condition include:  Swelling (edema) of the face, legs, ankles, or feet.  Tiredness (lethargy) and having less energy.  Nausea or vomiting.  Confusion or trouble concentrating.  Problems with urination, such as: ? Painful or burning feeling during urination. ? Decreased urine production. ? Frequent urination, especially at night. ? Bloody urine.  Muscle twitches and cramps, especially in the legs.  Shortness of breath.  Weakness.  Loss of appetite.  Metallic taste in the mouth.  Trouble sleeping.  Dry, itchy skin.  A low blood count (anemia).  Pale lining of the eyelids and surface of the eye (conjunctiva). Symptoms develop slowly and may not be obvious until the kidney damage becomes severe. It is possible to have kidney disease for years without having any symptoms. How is this diagnosed? This condition may be diagnosed based on:  Blood tests.  Urine tests.  Imaging tests, such as an ultrasound or CT scan.  A test in which a sample of tissue is removed from the kidneys to be examined under a microscope (kidney biopsy). These test results will help your health care provider determine how serious the CKD is. How is this treated? There is no cure for most cases of this condition, but treatment usually relieves symptoms and prevents or slows the progression of the disease. Treatment may include:  Making diet changes, which may require you to avoid alcohol, salty foods (sodium),  and foods that are high in potassium, calcium, and protein.  Medicines: ? To lower blood pressure. ? To control blood glucose. ? To relieve anemia. ? To relieve swelling. ? To protect your bones. ? To improve the balance of electrolytes in your blood.  Removing toxic  waste from the body through types of dialysis, if the kidneys can no longer do their job (kidney failure).  Managing any other conditions that are causing your CKD or making it worse. Follow these instructions at home: Medicines  Take over-the-counter and prescription medicines only as told by your health care provider. The dose of some medicines that you take may need to be adjusted.  Do not take any new medicines unless approved by your health care provider. Many medicines can worsen your kidney damage.  Do not take any vitamin and mineral supplements unless approved by your health care provider. Many nutritional supplements can worsen your kidney damage. General instructions  Follow your prescribed diet as told by your health care provider.  Do not use any products that contain nicotine or tobacco, such as cigarettes and e-cigarettes. If you need help quitting, ask your health care provider.  Monitor and track your blood pressure at home. Report changes in your blood pressure as told by your health care provider.  If you are being treated for diabetes, monitor and track your blood sugar (blood glucose) levels as told by your health care provider.  Maintain a healthy weight. If you need help with this, ask your health care provider.  Start or continue an exercise plan. Exercise at least 30 minutes a day, 5 days a week.  Keep your immunizations up to date as told by your health care provider.  Keep all follow-up visits as told by your health care provider. This is important. Where to find more information  American Association of Kidney Patients: BombTimer.gl  National Kidney Foundation: www.kidney.[place]: https://mathis.com/  Life Options Rehabilitation Program: www.lifeoptions.org and www.kidneyschool.org Contact a health care provider if:  Your symptoms get worse.  You develop new symptoms. Get help right away if:  You develop symptoms of ESRD, which  include: ? Headaches. ? Numbness in the hands or feet. ? Easy bruising. ? Frequent hiccups. ? Chest pain. ? Shortness of breath. ? Lack of menstruation, in women.  You have a fever.  You have decreased urine production.  You have pain or bleeding when you urinate. Summary  Chronic kidney disease (CKD) occurs when the kidneys become damaged slowly over a long period of time.  The most common causes of this condition are diabetes and high blood pressure (hypertension).  There is no cure for most cases of this condition, but treatment usually relieves symptoms and prevents or slows the progression of the disease. Treatment may include a combination of medicines and lifestyle changes. This information is not intended to replace advice given to you by your health care provider. Make sure you discuss any questions you have with your health care provider. Document Released: 02/28/2008 Document Revised: 05/03/2017 Document Reviewed: 06/28/2016 Elsevier Patient Education  2020 [employer].    An after visit summary with all of these plans was given to the patient.

## 2019-01-21 ENCOUNTER — Telehealth (HOSPITAL_COMMUNITY): Payer: Self-pay | Admitting: *Deleted

## 2019-01-21 MED ORDER — SPIRONOLACTONE 25 MG PO TABS: 25.0000 mg | ORAL_TABLET | Freq: Every day | ORAL | 5 refills | Status: DC

## 2019-01-21 NOTE — Telephone Encounter (Signed)
Refill encounter. Refilled spironolactone to [facility] on [address]

## 2019-02-12 ENCOUNTER — Ambulatory Visit (HOSPITAL_BASED_OUTPATIENT_CLINIC_OR_DEPARTMENT_OTHER)
Admission: RE | Admit: 2019-02-12 | Discharge: 2019-02-12 | Disposition: A | Discharge status: Home / Self Care | Payer: Medicare Other | Source: Ambulatory Visit | Attending: Internal Medicine | Admitting: Internal Medicine

## 2019-02-12 ENCOUNTER — Encounter (HOSPITAL_COMMUNITY): Payer: Self-pay | Admitting: Internal Medicine

## 2019-02-12 ENCOUNTER — Ambulatory Visit (HOSPITAL_COMMUNITY)
Admission: RE | Admit: 2019-02-12 | Discharge: 2019-02-12 | Disposition: A | Discharge status: Home / Self Care | Payer: Medicare Other | Source: Ambulatory Visit | Attending: Internal Medicine | Admitting: Internal Medicine

## 2019-02-12 ENCOUNTER — Ambulatory Visit (HOSPITAL_COMMUNITY)
Admission: RE | Admit: 2019-02-12 | Discharge: 2019-02-12 | Disposition: A | Discharge status: Home / Self Care | Payer: Medicare Other | Source: Ambulatory Visit

## 2019-02-12 ENCOUNTER — Other Ambulatory Visit: Payer: Self-pay

## 2019-02-12 VITALS — BP 122/92 | HR 57 | Wt 138.8 lb

## 2019-02-12 DIAGNOSIS — I252 Old myocardial infarction: Secondary | ICD-10-CM | POA: Diagnosis not present

## 2019-02-12 DIAGNOSIS — Z951 Presence of aortocoronary bypass graft: Secondary | ICD-10-CM | POA: Insufficient documentation

## 2019-02-12 DIAGNOSIS — K219 Gastro-esophageal reflux disease without esophagitis: Secondary | ICD-10-CM | POA: Diagnosis not present

## 2019-02-12 DIAGNOSIS — I340 Nonrheumatic mitral (valve) insufficiency: Secondary | ICD-10-CM | POA: Diagnosis not present

## 2019-02-12 DIAGNOSIS — I4819 Other persistent atrial fibrillation: Secondary | ICD-10-CM | POA: Diagnosis not present

## 2019-02-12 DIAGNOSIS — I5022 Chronic systolic (congestive) heart failure: Secondary | ICD-10-CM

## 2019-02-12 DIAGNOSIS — I4891 Unspecified atrial fibrillation: Secondary | ICD-10-CM

## 2019-02-12 DIAGNOSIS — N183 Chronic kidney disease, stage 3 (moderate): Secondary | ICD-10-CM | POA: Diagnosis not present

## 2019-02-12 DIAGNOSIS — I2510 Atherosclerotic heart disease of native coronary artery without angina pectoris: Secondary | ICD-10-CM | POA: Diagnosis not present

## 2019-02-12 DIAGNOSIS — F419 Anxiety disorder, unspecified: Secondary | ICD-10-CM | POA: Insufficient documentation

## 2019-02-12 DIAGNOSIS — Z955 Presence of coronary angioplasty implant and graft: Secondary | ICD-10-CM | POA: Insufficient documentation

## 2019-02-12 DIAGNOSIS — Z7901 Long term (current) use of anticoagulants: Secondary | ICD-10-CM | POA: Insufficient documentation

## 2019-02-12 DIAGNOSIS — Z79899 Other long term (current) drug therapy: Secondary | ICD-10-CM | POA: Diagnosis not present

## 2019-02-12 DIAGNOSIS — I4821 Permanent atrial fibrillation: Secondary | ICD-10-CM | POA: Diagnosis not present

## 2019-02-12 DIAGNOSIS — Z853 Personal history of malignant neoplasm of breast: Secondary | ICD-10-CM | POA: Diagnosis not present

## 2019-02-12 DIAGNOSIS — I5021 Acute systolic (congestive) heart failure: Secondary | ICD-10-CM | POA: Diagnosis not present

## 2019-02-12 DIAGNOSIS — Z8711 Personal history of peptic ulcer disease: Secondary | ICD-10-CM | POA: Diagnosis not present

## 2019-02-12 DIAGNOSIS — I130 Hypertensive heart and chronic kidney disease with heart failure and stage 1 through stage 4 chronic kidney disease, or unspecified chronic kidney disease: Secondary | ICD-10-CM | POA: Insufficient documentation

## 2019-02-12 DIAGNOSIS — Z7902 Long term (current) use of antithrombotics/antiplatelets: Secondary | ICD-10-CM | POA: Diagnosis not present

## 2019-02-12 LAB — CBC
HCT: 33.6 % — ABNORMAL LOW (ref 36.0–46.0)
Hemoglobin: 10.5 g/dL — ABNORMAL LOW (ref 12.0–15.0)
MCH: 30.3 pg (ref 26.0–34.0)
MCHC: 31.3 g/dL (ref 30.0–36.0)
MCV: 97.1 fL (ref 80.0–100.0)
Platelets: 179 10*3/uL (ref 150–400)
RBC: 3.46 MIL/uL — ABNORMAL LOW (ref 3.87–5.11)
RDW: 13.8 % (ref 11.5–15.5)
WBC: 4.3 10*3/uL (ref 4.0–10.5)
nRBC: 0 % (ref 0.0–0.2)

## 2019-02-12 LAB — COMPREHENSIVE METABOLIC PANEL
ALT: 36 U/L (ref 0–44)
AST: 29 U/L (ref 15–41)
Albumin: 3.8 g/dL (ref 3.5–5.0)
Alkaline Phosphatase: 36 U/L — ABNORMAL LOW (ref 38–126)
Anion gap: 7 (ref 5–15)
BUN: 34 mg/dL — ABNORMAL HIGH (ref 8–23)
CO2: 21 mmol/L — ABNORMAL LOW (ref 22–32)
Calcium: 10.4 mg/dL — ABNORMAL HIGH (ref 8.9–10.3)
Chloride: 111 mmol/L (ref 98–111)
Creatinine, Ser: 1.74 mg/dL — ABNORMAL HIGH (ref 0.44–1.00)
GFR calc Af Amer: 31 mL/min — ABNORMAL LOW (ref >60)
GFR calc non Af Amer: 27 mL/min — ABNORMAL LOW (ref >60)
Glucose, Bld: 83 mg/dL (ref 70–99)
Potassium: 4.6 mmol/L (ref 3.5–5.1)
Sodium: 139 mmol/L (ref 135–145)
Total Bilirubin: 1.2 mg/dL (ref 0.3–1.2)
Total Protein: 6.8 g/dL (ref 6.5–8.1)

## 2019-02-12 LAB — BRAIN NATRIURETIC PEPTIDE: B Natriuretic Peptide: 733.3 pg/mL — ABNORMAL HIGH (ref 0.0–100.0)

## 2019-02-12 LAB — LIPID PANEL
Cholesterol: 133 mg/dL (ref 0–200)
HDL: 66 mg/dL (ref >40)
LDL Cholesterol: 59 mg/dL (ref 0–99)
Total CHOL/HDL Ratio: 2 RATIO
Triglycerides: 39 mg/dL (ref <150)
VLDL: 8 mg/dL (ref 0–40)

## 2019-02-12 NOTE — Progress Notes (Signed)
  Echocardiogram 2D Echocardiogram has been performed.  [name] 02/12/2019, 10:03 AM

## 2019-02-12 NOTE — Patient Instructions (Signed)
Lab work done today. We will notify you of any abnormal lab work. No news is good news!  EKG done today.  STOP Plavix  Your provider has recommended that  you wear a Zio Patch for 7 days.  This monitor will record your heart rhythm for our review.  IF you have any symptoms while wearing the monitor please press the button.  If you have any issues with the patch or you notice a red or orange light on it please call the company at [phone].  Once you remove the patch please mail it back to the company as soon as possible so we can get the results.   Please follow up with the [facility] in 6 months. Please give [place] a call at [phone] option #3 in January to schedule this appointment.   At the [facility], you and your health needs are our priority. As part of our continuing mission to provide you with exceptional heart care, we have created designated Provider Care Teams. These Care Teams include your primary Cardiologist (physician) and Advanced Practice Providers (APPs- Physician Assistants and Nurse Practitioners) who all work together to provide you with the care you need, when you need it.   You may see any of the following providers on your designated Care Team at your next follow up: Marland Kitchen Dr Glori Bickers . Dr Loralie Champagne . Darrick Grinder, NP   Please be sure to bring in all your medications bottles to every appointment.

## 2019-02-12 NOTE — Progress Notes (Addendum)
Advanced Heart Failure Clinic Note   Date:  02/12/2019   ID:  [name], DOB 12/25/1934, MRN [id number]  Location: Home  Provider location: [facility] Type of Visit: Established patient  PCP:  [name], MD  Cardiologist:  No primary care provider on file. Primary HF: [name]  Chief Complaint: Heart Failure follow-up   History of Present Illness:  [name] is a 84 y.o. female with h/o HTN, breast cancer, CAD, PAF.   Admitted to [facility]  ED 07/27/17 with CP.Hospital course was complicated by acute respiratory failure, bradycardic arrest, Afib RVR, and cardiogenic shock in the setting of anterior MI. Found to have acute anterior MI and takenurgently to thecath lab. LHC 07/27/17 as below with 3v CAD, LVEF 35-40%, elevated LVEDP. Underwent PCI with successful stenting of the ramus intermediate artery with DES x 2. Had bradycardic arrest on arrival to ICU with epi/atropine and brief CPR and intubation. As she improved she was extubated on 2/24. An ECHO was completed an showed LVEF 40-45%.   Diuresed with IV lasix and once euvolemic transitioned to po lasix. HF medications optimized. Renal function was followed closely. She had several episodes of bradycardia so beta blocker was stopped. Later developed A fib RVR so amiodarone was loaded and she was started on eliquis. Chemically converted to NSR but had some break through A fib. Plan to continue amio 400 mg twice a day for 7 days then cut back to 200 mg twice a day. Continue eliquis 2.5 mg twice a day ( reduced dose for age and creatinine). Discharge 167 pounds.   On 06/13/18 she underwent cardioversion for recurrent A fib. She returned to the A fib clinic on 06/19/18 and was back in A Fib.   She presents today for routine f/u.  Says she feels good. Gets around the house without much problem. Has been following BPs regularly SBP 110-120. Says HR 70-80s. No CP, orthopnea, PND or edema.  Taking lasix. On Eliquis and Plavix. No bleeding   Echo today EF 40-45% 3+ MR Personally reviewed  ECHO 12/2017 45-50%  ECHO 07/27/2017 EF 40-45% RVSP 50 mm hg   LHC 07/27/2017  Post Atrio lesion is 90% stenosed.  Prox LAD to Dist LAD lesion is 50% stenosed.  Ost 1st Diag lesion is 95% stenosed.  Ost 2nd Diag to 2nd Diag lesion is 90% stenosed.  Mid Cx lesion is 30% stenosed.  Ost Ramus to Ramus lesion is 100% stenosed.  A drug-eluting stent was successfully placed using a STENT SYNERGY DES 2.75X28.  A drug-eluting stent was successfully placed using a STENT SYNERGY DES 2.5X12.  Post intervention, there is a 0% residual stenosis.  There is moderate to severe left ventricular systolic dysfunction.  LV end diastolic pressure is moderately elevated.  The left ventricular ejection fraction is 35-45% by visual estimate. 1. 3 vessel obstructive CAD - 100% ramus intermediate artery. This is a large vessel and is the culprit lesion. - 95% small first diagonal - 90% small second diagonal  - diffusely diseased LAD - 90% PLOM    Past Medical History:  Diagnosis Date  . Anxiety   . Breast cancer of lower-outer quadrant of left female breast (HCC) 10/11/2014   treated with lumpectomy and radiation  . Coronary artery disease 07/27/2017  . GERD (gastroesophageal reflux disease)   . Hypertension   . Myocardial infarction (HCC) 07/27/2017   STEMI of anterolateral wall. Cath w/ stent and cabg by  Dr. Swaziland  . Osteoporosis 10/20/2014  . Personal history of radiation therapy 2016   treatment of breast cancer  . PONV (postoperative nausea and vomiting)   . PUD (peptic ulcer disease)   . Pure hypercholesterolemia 04/08/2014  . Stone, kidney    Past Surgical History:  Procedure Laterality Date  . BREAST LUMPECTOMY Left 2016  . BREAST LUMPECTOMY WITH SENTINEL LYMPH NODE BIOPSY Left 10/22/2014   Procedure: BREAST LUMPECTOMY WITH SENTINEL LYMPH NODE BIOPSY;  Surgeon: Chevis Pretty III, MD;  Location: [facility];  Service: General;  Laterality: Left;  . CARDIOVERSION N/A 06/13/2018   Procedure: CARDIOVERSION;  Surgeon: Dolores Patty, MD;  Location: [facility] ENDOSCOPY;  Service: Cardiovascular;  Laterality: N/A;  . CORONARY STENT INTERVENTION N/A 07/27/2017   Procedure: CORONARY STENT INTERVENTION;  Surgeon: Swaziland, Peter M, MD;  Location: [facility] INVASIVE CV LAB;  Service: Cardiovascular;  Laterality: N/A;  . CORONARY/GRAFT ACUTE MI REVASCULARIZATION N/A 07/27/2017   Procedure: Coronary/Graft Acute MI Revascularization;  Surgeon: Swaziland, Peter M, MD;  Location: [facility] INVASIVE CV LAB;  Service: Cardiovascular;  Laterality: N/A;  . LEFT HEART CATH AND CORONARY ANGIOGRAPHY N/A 07/27/2017   Procedure: LEFT HEART CATH AND CORONARY ANGIOGRAPHY;  Surgeon: Swaziland, Peter M, MD;  Location: [facility] INVASIVE CV LAB;  Service: Cardiovascular;  Laterality: N/A;  . RE-EXCISION OF BREAST CANCER,SUPERIOR MARGINS Left 11/04/2014   Procedure: RE-EXCISION OF LEFT BREAST INFERIOR MARGINS;  Surgeon: Chevis Pretty III, MD;  Location: [facility] OR;  Service: General;  Laterality: Left;     Current Outpatient Medications  Medication Sig Dispense Refill  . anastrozole (ARIMIDEX) 1 MG tablet Take 1 tablet (1 mg total) by mouth daily. 90 tablet 4  . apixaban (ELIQUIS) 2.5 MG TABS tablet Take 1 tablet (2.5 mg total) by mouth 2 (two) times daily. 180 tablet 3  . atorvastatin (LIPITOR) 80 MG tablet TAKE 1 TABLET BY MOUTH ONCE DAILY AT  6PM 90 tablet 0  . Calcium Carb-Cholecalciferol (CALCIUM + D3) 600-800 MG-UNIT TABS Take 1 tablet by mouth every evening.     . Cholecalciferol (VITAMIN D-3) 125 MCG (5000 UT) TABS Take 5,000 Units by mouth every morning. 90 tablet 4  . clopidogrel (PLAVIX) 75 MG tablet TAKE 1 TABLET BY MOUTH ONCE DAILY 90 tablet 1  . co-enzyme Q-10 30 MG capsule Take 30 mg by mouth 3 (three) times daily.    Marland Kitchen Fe Bisgly-Vit C-Vit B12-FA (GENTLE IRON PO) Take 1-2 tablets by mouth daily.    . fish  oil-omega-3 fatty acids 1000 MG capsule Take 1 g by mouth at bedtime.     . furosemide (LASIX) 20 MG tablet Take 20 mg by mouth as needed for fluid.    Marland Kitchen losartan (COZAAR) 25 MG tablet Take 1 tablet (25 mg total) by mouth 2 (two) times daily. 180 tablet 3  . Magnesium Oxide (MAG-CAPS PO) Take by mouth. QD    . Multiple Vitamin (MULTIVITAMIN WITH MINERALS) TABS tablet Take 1 tablet by mouth daily.    . nitroGLYCERIN (NITROSTAT) 0.3 MG SL tablet Place 1 tablet (0.3 mg total) under the tongue every 5 (five) minutes as needed for chest pain. 90 tablet 12  . spironolactone (ALDACTONE) 25 MG tablet Take 1 tablet (25 mg total) by mouth daily. 30 tablet 5   No current facility-administered medications for this encounter.     Allergies:   Lisinopril   Social History:  The patient  reports that she has never smoked. She has never used smokeless tobacco.  She reports that she does not drink alcohol or use drugs.   Family History:  The patient's family history includes Heart disease in her brother; Hypertension in her brother, brother, sister, and sister; Pancreatic cancer in her mother; Stroke in her father.   ROS:  Please see the history of present illness.   All other systems are personally reviewed and negative.   Exam:   General:  Elderly frail  No resp difficulty HEENT: normal Neck: supple. no JVD. Carotids 2+ bilat; no bruits. No lymphadenopathy or thryomegaly appreciated. Cor: PMI nondisplaced. Irregular tachy. 2/6 MR. Lungs: clear Abdomen: soft, nontender, nondistended. No hepatosplenomegaly. No bruits or masses. Good bowel sounds. Extremities: no cyanosis, clubbing, rash, edema Neuro: alert & orientedx3, cranial nerves grossly intact. moves all 4 extremities w/o difficulty. Affect pleasant   Recent Labs: 05/15/2018: TSH 1.230 07/22/2018: Magnesium 2.5 10/15/2018: B Natriuretic Peptide 690.8 01/13/2019: ALT 33; BUN 44; Creatinine, Ser 1.87; Hemoglobin 10.2; Platelets 185; Potassium 4.7;  Sodium 138  Personally reviewed   Wt Readings from Last 3 Encounters:  02/12/19 63 kg (138 lb 12.8 oz)  01/20/19 62.4 kg (137 lb 9.6 oz)  01/13/19 62.3 kg (137 lb 4.8 oz)      ASSESSMENT AND PLAN:  1. CAD - 3V CAD with STEMI 07/2017 S/P DES x2  - No s/s of ischemia  - Continue eliquis, and high dose statin. Off ASA. Stop Plavix  - Continue carvedilol 3.125 bid - check lipids today  2. Chronic Systolic Heart Failure, ICM- ECHO EF 07/2017 EF 40-45%   Echo 12/2017 EF 45-50%  - Echo today EF ~40-45% 3+ MR (await formal read) Personally reviewed - Volume status stable - Continue lasix 20 mg several times per week  - Continue losartan 25 mg twice a day. BP too low to titrate  - Continue 25 mg spironolactone daily  - Continue coreg 3.125 bid. She has not tolerated higher doses in the past with bradycardia.  - Reinforced fluid restriction to < 2 L daily, sodium restriction to less than 2000 mg daily, and the importance of daily weights.   3. Permanent AF -S/P DC-CV 06/13/2018 to NSR but back in A fib on 1/16  - Remains in A fib. She was intolerant amio due to nausea and tremors.  - Dr Johney Frame was planning PPM insertion, then uptitration of BB. She has deferred this procedure for now.  -  Ventricular response is fast (108 today). Will place 3-day Zio to quantify  - Followed in AF clinic.  - Continue eliquis 2.5 mg BID. Denies bleeding.   4. HTN - Blood pressure well controlled. Continue current regimen.  5. H.O Left Breast Cancer - No change to current plan.    6. CKD III - Creatinine 1.5-1.8.  - Recheck BMET  7. Mitral regurgitation - ischemic in nature. 3+ MR on echo today.  - if develops symptoms will need to consider MitraClip   Total time spent 35 minutes. Over half that time spent discussing above.    Signed, Arvilla Meres, MD  02/12/2019 10:49 AM  Advanced Heart Failure Clinic [facility] [address] Heart and Vascular [place] [address]  [phone] (office) [phone] (fax)

## 2019-02-12 NOTE — Addendum Note (Signed)
Encounter addended by: Jolaine Artist, MD on: 02/12/2019 4:41 PM  Actions taken: Clinical Note Signed

## 2019-02-13 NOTE — Addendum Note (Signed)
Encounter addended by: Jolaine Artist, MD on: 02/13/2019 11:56 AM  Actions taken: Clinical Note Signed

## 2019-02-17 ENCOUNTER — Other Ambulatory Visit: Payer: Self-pay

## 2019-03-10 NOTE — Addendum Note (Signed)
Encounter addended by: Micki Riley, RN on: 03/10/2019 12:10 PM  Actions taken: Imaging Exam ended

## 2019-04-06 ENCOUNTER — Other Ambulatory Visit (HOSPITAL_COMMUNITY): Payer: Self-pay | Admitting: Internal Medicine

## 2019-04-23 ENCOUNTER — Other Ambulatory Visit: Payer: Medicare Other

## 2019-05-26 ENCOUNTER — Telehealth (HOSPITAL_COMMUNITY): Payer: Self-pay | Admitting: Pharmacist

## 2019-05-26 NOTE — Telephone Encounter (Signed)
Submitted tier exception for Eliquis in effort to reduce patient copay.   Status is pending. Will continue to follow.  Audry Riles, PharmD, BCPS, BCCP, CPP Heart Failure Clinic Pharmacist [phone]

## 2019-05-27 NOTE — Telephone Encounter (Signed)
Tier exception for Eliquis denied as there are no brand name drugs for the same condition on a lower tier. Communicated denial to patient. She will be out of her coverage gap in January 2021. At that time, will consider applying for manufacturer assistance if copay is still unaffordable.   Audry Riles, PharmD, BCPS, BCCP, CPP Heart Failure Clinic Pharmacist [phone]

## 2019-06-10 ENCOUNTER — Telehealth (HOSPITAL_COMMUNITY): Payer: Self-pay

## 2019-06-10 NOTE — Telephone Encounter (Signed)
Patient called inquiring about the COVID19 vaccine.  I advised patient that we are not giving the vaccine here in office but the providers do recommend that patients get it and to contact the health dept to get an appointment

## 2019-06-12 ENCOUNTER — Other Ambulatory Visit (HOSPITAL_COMMUNITY): Payer: Self-pay | Admitting: Adult Health

## 2019-07-02 ENCOUNTER — Telehealth (HOSPITAL_COMMUNITY): Payer: Self-pay

## 2019-07-02 NOTE — Telephone Encounter (Signed)
Received message on triage line about patient having itching on legs. Returned call, left message to return call to office

## 2019-07-04 ENCOUNTER — Other Ambulatory Visit (HOSPITAL_COMMUNITY): Payer: Self-pay | Admitting: Adult Health

## 2019-07-21 ENCOUNTER — Ambulatory Visit (INDEPENDENT_AMBULATORY_CARE_PROVIDER_SITE_OTHER): Payer: Medicare Other | Admitting: Family Medicine

## 2019-07-21 ENCOUNTER — Encounter: Payer: Self-pay | Admitting: Family Medicine

## 2019-07-21 ENCOUNTER — Other Ambulatory Visit: Payer: Self-pay

## 2019-07-21 VITALS — BP 121/83 | HR 95 | Temp 97.5°F | Resp 18 | Ht 68.0 in | Wt 142.6 lb

## 2019-07-21 DIAGNOSIS — R634 Abnormal weight loss: Secondary | ICD-10-CM | POA: Diagnosis not present

## 2019-07-21 DIAGNOSIS — E210 Primary hyperparathyroidism: Secondary | ICD-10-CM | POA: Diagnosis not present

## 2019-07-21 DIAGNOSIS — E7800 Pure hypercholesterolemia, unspecified: Secondary | ICD-10-CM

## 2019-07-21 DIAGNOSIS — D170 Benign lipomatous neoplasm of skin and subcutaneous tissue of head, face and neck: Secondary | ICD-10-CM

## 2019-07-21 DIAGNOSIS — R202 Paresthesia of skin: Secondary | ICD-10-CM | POA: Diagnosis not present

## 2019-07-21 DIAGNOSIS — D6869 Other thrombophilia: Secondary | ICD-10-CM

## 2019-07-21 NOTE — Progress Notes (Signed)
Established Patient Office Visit  Subjective:  Patient ID: [name], female    DOB: [birth year]  Age: 85 y.o. MRN: [id number]  CC:  Chief Complaint  Patient presents with  . a-fib and chronic medical conditions    6 month f/u.  Pt has questions about vitamin d-3 dosing should she take 2500u in the a.m. and 2500u at bedtime, and should she increase the mg's of her coq10 as she read a higher dosage is more beneficial.    HPI [name] presents for   CAD and A. FIB Patient reports that she read a lot about the damaging effects of the statin She had a heart attack in the past with a CABG She states that she would like to get off the statin   Hypercalcemia due to Hyperparathroidism She exercises by walking around the house She gets tingling in her legs at times She is taking vitamin D  She is taking a calcium supplement She denies constipation  Tingling She states that she has burning and itching from her knee down to her feet and sometimes it feels like tingling.  This started 2 weeks ago and is improved already.    Lab Results  Component Value Date   CREATININE 1.74 (H) 02/12/2019    Depression screen [facility] 2/9 07/21/2019 01/20/2019 01/17/2018 01/10/2018 12/03/2017  Decreased Interest 0 0 0 0 0  Down, Depressed, Hopeless 0 0 0 0 0  PHQ - 2 Score 0 0 0 0 0  Some recent data might be hidden   Lipoma on face She reports that she started having a black head on her face 2-3 years ago Now it is a hard lump She put topical treatments and a black head treatment in the past without resolution It is not painful, red or oozing but because it is on her face and getting bigger she wants it removed.  Past Medical History:  Diagnosis Date  . Anxiety   . Breast cancer of lower-outer quadrant of left female breast ([place]) 10/11/2014   treated with lumpectomy and radiation  . Coronary artery disease 07/27/2017  . GERD (gastroesophageal reflux disease)   . Hypertension   .  Myocardial infarction ([place]) 07/27/2017   STEMI of anterolateral wall. Cath w/ stent and cabg by Dr. Martinique  . Osteoporosis 10/20/2014  . Personal history of radiation therapy 2016   treatment of breast cancer  . PONV (postoperative nausea and vomiting)   . PUD (peptic ulcer disease)   . Pure hypercholesterolemia 04/08/2014  . Stone, kidney     Past Surgical History:  Procedure Laterality Date  . BREAST LUMPECTOMY Left 2016  . BREAST LUMPECTOMY WITH SENTINEL LYMPH NODE BIOPSY Left 10/22/2014   Procedure: BREAST LUMPECTOMY WITH SENTINEL LYMPH NODE BIOPSY;  Surgeon: Autumn Messing III, MD;  Location: [place];  Service: General;  Laterality: Left;  . CARDIOVERSION N/A 06/13/2018   Procedure: CARDIOVERSION;  Surgeon: Jolaine Artist, MD;  Location: [facility] ENDOSCOPY;  Service: Cardiovascular;  Laterality: N/A;  . CORONARY STENT INTERVENTION N/A 07/27/2017   Procedure: CORONARY STENT INTERVENTION;  Surgeon: Martinique, Peter M, MD;  Location: [place] CV LAB;  Service: Cardiovascular;  Laterality: N/A;  . CORONARY/GRAFT ACUTE MI REVASCULARIZATION N/A 07/27/2017   Procedure: Coronary/Graft Acute MI Revascularization;  Surgeon: Martinique, Peter M, MD;  Location: [place] CV LAB;  Service: Cardiovascular;  Laterality: N/A;  . LEFT HEART CATH AND CORONARY ANGIOGRAPHY N/A 07/27/2017   Procedure: LEFT HEART CATH AND CORONARY  ANGIOGRAPHY;  Surgeon: Martinique, Peter M, MD;  Location: [place] CV LAB;  Service: Cardiovascular;  Laterality: N/A;  . RE-EXCISION OF BREAST CANCER,SUPERIOR MARGINS Left 11/04/2014   Procedure: RE-EXCISION OF LEFT BREAST INFERIOR MARGINS;  Surgeon: Autumn Messing III, MD;  Location: [place];  Service: General;  Laterality: Left;    Family History  Problem Relation Age of Onset  . Pancreatic cancer Mother        pancreastic  . Stroke Father   . Hypertension Brother   . Hypertension Sister   . Hypertension Brother   . Heart disease Brother   . Hypertension Sister      Social History   Socioeconomic History  . Marital status: Married    Spouse name: Not on file  . Number of children: Not on file  . Years of education: Not on file  . Highest education level: Not on file  Occupational History  . Occupation: retired  Tobacco Use  . Smoking status: Never Smoker  . Smokeless tobacco: Never Used  Substance and Sexual Activity  . Alcohol use: No  . Drug use: No  . Sexual activity: Not on file  Other Topics Concern  . Not on file  Social History Narrative  . Not on file   Social Determinants of Health   Financial Resource Strain:   . Difficulty of Paying Living Expenses: Not on file  Food Insecurity:   . Worried About Charity fundraiser in the Last Year: Not on file  . Ran Out of Food in the Last Year: Not on file  Transportation Needs:   . Lack of Transportation (Medical): Not on file  . Lack of Transportation (Non-Medical): Not on file  Physical Activity:   . Days of Exercise per Week: Not on file  . Minutes of Exercise per Session: Not on file  Stress:   . Feeling of Stress : Not on file  Social Connections:   . Frequency of Communication with Friends and Family: Not on file  . Frequency of Social Gatherings with Friends and Family: Not on file  . Attends Religious Services: Not on file  . Active Member of Clubs or Organizations: Not on file  . Attends Archivist Meetings: Not on file  . Marital Status: Not on file  Intimate Partner Violence:   . Fear of Current or Ex-Partner: Not on file  . Emotionally Abused: Not on file  . Physically Abused: Not on file  . Sexually Abused: Not on file    Outpatient Medications Prior to Visit  Medication Sig Dispense Refill  . anastrozole (ARIMIDEX) 1 MG tablet Take 1 tablet (1 mg total) by mouth daily. 90 tablet 4  . apixaban (ELIQUIS) 2.5 MG TABS tablet Take 1 tablet (2.5 mg total) by mouth 2 (two) times daily. 180 tablet 3  . atorvastatin (LIPITOR) 80 MG tablet TAKE 1 TABLET  BY MOUTH ONCE DAILY AT  6PM 90 tablet 0  . Calcium Carb-Cholecalciferol (CALCIUM + D3) 600-800 MG-UNIT TABS Take 1 tablet by mouth every evening.     . Cholecalciferol (VITAMIN D-3) 125 MCG (5000 UT) TABS Take 5,000 Units by mouth every morning. 90 tablet 4  . co-enzyme Q-10 30 MG capsule Take 30 mg by mouth 3 (three) times daily.    Marland Kitchen Fe Bisgly-Vit C-Vit B12-FA (GENTLE IRON PO) Take 1-2 tablets by mouth daily.    . fish oil-omega-3 fatty acids 1000 MG capsule Take 1,200 mg by mouth at bedtime.     Marland Kitchen  furosemide (LASIX) 20 MG tablet TAKE ONE TABLET EVERY OTHER DAY ALTERNATING WITH TWO TABLETS EVERY OTHER DAY 135 tablet 0  . losartan (COZAAR) 25 MG tablet Take 1 tablet (25 mg total) by mouth 2 (two) times daily. 180 tablet 3  . Magnesium Oxide (MAG-CAPS PO) Take by mouth. QD    . Multiple Vitamin (MULTIVITAMIN WITH MINERALS) TABS tablet Take 1 tablet by mouth daily.    . nitroGLYCERIN (NITROSTAT) 0.3 MG SL tablet Place 1 tablet (0.3 mg total) under the tongue every 5 (five) minutes as needed for chest pain. 90 tablet 12  . spironolactone (ALDACTONE) 25 MG tablet Take 1 tablet by mouth once daily 90 tablet 0   No facility-administered medications prior to visit.    Allergies  Allergen Reactions  . Lisinopril Cough    ROS Review of Systems Review of Systems  Constitutional: Negative for activity change, appetite change, chills and fever.  HENT: Negative for congestion, nosebleeds, trouble swallowing and voice change.   Respiratory: Negative for cough, shortness of breath and wheezing.   Gastrointestinal: Negative for diarrhea, nausea and vomiting.  Genitourinary: Negative for difficulty urinating, dysuria, flank pain and hematuria.  Musculoskeletal: Negative for back pain, joint swelling and neck pain.  Neurological: Negative for dizziness, speech difficulty, light-headedness and numbness.  See HPI. All other review of systems negative.     Objective:    Physical Exam  BP 121/83 (BP  Location: Left Arm, Patient Position: Sitting, Cuff Size: Normal)   Pulse 95   Temp (!) 97.5 F (36.4 C) (Oral)   Resp 18   Ht '5\' 8"'$  (1.727 m)   Wt 142 lb 9.6 oz (64.7 kg)   SpO2 98%   BMI 21.68 kg/m  Wt Readings from Last 3 Encounters:  07/21/19 142 lb 9.6 oz (64.7 kg)  02/12/19 138 lb 12.8 oz (63 kg)  01/20/19 137 lb 9.6 oz (62.4 kg)   Physical Exam  Constitutional: Oriented to person, place, and time. Appears well-developed and well-nourished.  HENT:  Head: Normocephalic and atraumatic.  Eyes: Conjunctivae and EOM are normal.  Cardiovascular: Normal rate, regular rhythm, normal heart sounds and intact distal pulses.  No murmur heard. Pulmonary/Chest: Effort normal and breath sounds normal. No stridor. No respiratory distress. Has no wheezes.  Neurological: Is alert and oriented to person, place, and time.  Skin: Skin is warm. Capillary refill takes less than 2 seconds.  Psychiatric: Has a normal mood and affect. Behavior is normal. Judgment and thought content normal.    There are no preventive care reminders to display for this patient.  There are no preventive care reminders to display for this patient.  Lab Results  Component Value Date   TSH 1.230 05/15/2018   Lab Results  Component Value Date   WBC 4.3 02/12/2019   HGB 10.5 (L) 02/12/2019   HCT 33.6 (L) 02/12/2019   MCV 97.1 02/12/2019   PLT 179 02/12/2019   Lab Results  Component Value Date   NA 139 02/12/2019   K 4.6 02/12/2019   CHLORIDE 110 (H) 05/21/2017   CO2 21 (L) 02/12/2019   GLUCOSE 83 02/12/2019   BUN 34 (H) 02/12/2019   CREATININE 1.74 (H) 02/12/2019   BILITOT 1.2 02/12/2019   ALKPHOS 36 (L) 02/12/2019   AST 29 02/12/2019   ALT 36 02/12/2019   PROT 6.8 02/12/2019   ALBUMIN 3.8 02/12/2019   CALCIUM 10.4 (H) 02/12/2019   ANIONGAP 7 02/12/2019   EGFR 56 (L) 05/21/2017   Lab Results  Component Value Date   CHOL 133 02/12/2019   Lab Results  Component Value Date   HDL 66 02/12/2019     Lab Results  Component Value Date   LDLCALC 59 02/12/2019   Lab Results  Component Value Date   TRIG 39 02/12/2019   Lab Results  Component Value Date   CHOLHDL 2.0 02/12/2019   Lab Results  Component Value Date   HGBA1C 5.8 (H) 05/15/2018      Assessment & Plan:   Problem List Items Addressed This Visit      Other   Pure hypercholesterolemia (Chronic) - will check liver enzymes   Relevant Orders   Comprehensive metabolic panel   Unintentional weight loss - Primary -  Improved    Hypercalcemia   Relevant Orders   CBC    Other Visit Diagnoses    Acquired thrombophilia ([place])       Primary hyperparathyroidism ([place])    - discussed that her hypercalcemia means that she does not need additional calcium   Relevant Orders   PTH, Intact and Calcium   VITAMIN D 25 Hydroxy (Vit-D Deficiency, Fractures)   Paresthesia of both lower extremities    -  Discussed B12 supplementation   Relevant Orders   Vitamin B12   Lipoma of face    -  Advised pt to follow up with Dermatology   Relevant Orders   Ambulatory referral to Dermatology      No orders of the defined types were placed in this encounter.   Follow-up: Return for will set appt for follow up after labs are back.    Forrest Moron, MD

## 2019-07-21 NOTE — Patient Instructions (Addendum)
Stop taking extra calcium Stop the magnesium Continue B12 supplement and CoQ10 Continue daily Vitamin D   If you have lab work done today you will be contacted with your lab results within the next 2 weeks.  If you have not heard from [place] then please contact us. The fastest way to get your results is to register for My Chart.   IF you received an x-ray today, you will receive an invoice from [facility]. Please contact [facility] at [phone] with questions or concerns regarding your invoice.   IF you received labwork today, you will receive an invoice from [place]. Please contact [facility] at [phone] with questions or concerns regarding your invoice.   Our billing staff will not be able to assist you with questions regarding bills from these companies.  You will be contacted with the lab results as soon as they are available. The fastest way to get your results is to activate your My Chart account. Instructions are located on the last page of this paperwork. If you have not heard from [place] regarding the results in 2 weeks, please contact this office.     Hypercalcemia Hypercalcemia is when the level of calcium in a person's blood is above normal. The body needs calcium to make bones and keep them strong. Calcium also helps the muscles, nerves, brain, and heart work the way they should. Most of the calcium in the body is in the bones. There is also some calcium in the blood. Hypercalcemia can happen when calcium comes out of the bones, or when the kidneys are not able to remove calcium from the blood. Hypercalcemia can be mild or severe. What are the causes? There are many possible causes of hypercalcemia. Common causes of this condition include:  Hyperparathyroidism. This is a condition in which the body produces too much parathyroid hormone. There are four parathyroid glands in your neck. These glands produce a chemical messenger (hormone) that helps the body absorb  calcium from foods and helps your bones release calcium.  Certain kinds of cancer. Less common causes of hypercalcemia include:  Getting too much calcium or vitamin D from your diet.  Kidney failure.  Hyperthyroidism.  Severe dehydration.  Being on bed rest or being inactive for a long time.  Certain medicines.  Infections. What increases the risk? You are more likely to develop this condition if you:  Are female.  Are 83 years of age or older.  Have a family history of hypercalcemia. What are the signs or symptoms? Mild hypercalcemia that starts slowly may not cause symptoms. Severe, sudden hypercalcemia is more likely to cause symptoms, such as:  Being more thirsty than usual.  Needing to urinate more often than usual.  Abdominal pain.  Nausea and vomiting.  Constipation.  Muscle pain, twitching, or weakness.  Feeling very tired. How is this diagnosed?  Hypercalcemia is usually diagnosed with a blood test. You may also have tests to help determine what is causing this condition, such as imaging tests and more blood tests. How is this treated? Treatment for hypercalcemia depends on the cause. Treatment may include:  Receiving fluids through an IV.  Medicines that: ? Keep calcium levels steady after receiving fluids (loop diuretics). ? Keep calcium in your bones (bisphosphonates). ? Lower the calcium level in your blood.  Surgery to remove overactive parathyroid glands.  A procedure that filters your blood to correct calcium levels (hemodialysis). Follow these instructions at home:   Take over-the-counter and prescription medicines only as  told by your health care provider.  Follow instructions from your health care provider about eating or drinking restrictions.  Drink enough fluid to keep your urine pale yellow.  Stay active. Weight-bearing exercise helps to keep calcium in your bones. Follow instructions from your health care provider about what  type and level of exercise is safe for you.  Keep all follow-up visits as told by your health care provider. This is important. Contact a health care provider if you have:  A fever.  A heartbeat that is irregular or very fast.  Changes in mood, memory, or personality. Get help right away if you:  Have severe abdominal pain.  Have chest pain.  Have trouble breathing.  Become very confused and sleepy.  Lose consciousness. Summary  Hypercalcemia is when the level of calcium in a person's blood is above normal. The body needs calcium to make bones and keep them strong. Calcium also helps the muscles, nerves, brain, and heart work the way they should.  There are many possible causes of hypercalcemia, and treatment depends on the cause.  Take over-the-counter and prescription medicines only as told by your health care provider.  Follow instructions from your health care provider about eating or drinking restrictions. This information is not intended to replace advice given to you by your health care provider. Make sure you discuss any questions you have with your health care provider. Document Revised: 06/17/2018 Document Reviewed: 02/24/2018 Elsevier Patient Education  2020 [employer].

## 2019-07-22 LAB — CBC
Hematocrit: 32.9 % — ABNORMAL LOW (ref 34.0–46.6)
Hemoglobin: 10.6 g/dL — ABNORMAL LOW (ref 11.1–15.9)
MCH: 30.4 pg (ref 26.6–33.0)
MCHC: 32.2 g/dL (ref 31.5–35.7)
MCV: 94 fL (ref 79–97)
Platelets: 164 10*3/uL (ref 150–450)
RBC: 3.49 x10E6/uL — ABNORMAL LOW (ref 3.77–5.28)
RDW: 13 % (ref 11.7–15.4)
WBC: 4.2 10*3/uL (ref 3.4–10.8)

## 2019-07-22 LAB — PTH, INTACT AND CALCIUM: PTH: 88 pg/mL — ABNORMAL HIGH (ref 15–65)

## 2019-07-22 LAB — COMPREHENSIVE METABOLIC PANEL
ALT: 29 IU/L (ref 0–32)
AST: 28 IU/L (ref 0–40)
Albumin/Globulin Ratio: 1.9 (ref 1.2–2.2)
Albumin: 4.6 g/dL (ref 3.6–4.6)
Alkaline Phosphatase: 47 IU/L (ref 39–117)
BUN/Creatinine Ratio: 23 (ref 12–28)
BUN: 40 mg/dL — ABNORMAL HIGH (ref 8–27)
Bilirubin Total: 0.9 mg/dL (ref 0.0–1.2)
CO2: 21 mmol/L (ref 20–29)
Calcium: 11.2 mg/dL — ABNORMAL HIGH (ref 8.7–10.3)
Chloride: 102 mmol/L (ref 96–106)
Creatinine, Ser: 1.77 mg/dL — ABNORMAL HIGH (ref 0.57–1.00)
GFR calc Af Amer: 30 mL/min/{1.73_m2} — ABNORMAL LOW (ref >59)
GFR calc non Af Amer: 26 mL/min/{1.73_m2} — ABNORMAL LOW (ref >59)
Globulin, Total: 2.4 g/dL (ref 1.5–4.5)
Glucose: 96 mg/dL (ref 65–99)
Potassium: 5 mmol/L (ref 3.5–5.2)
Sodium: 138 mmol/L (ref 134–144)
Total Protein: 7 g/dL (ref 6.0–8.5)

## 2019-07-22 LAB — VITAMIN B12: Vitamin B-12: 1763 pg/mL — ABNORMAL HIGH (ref 232–1245)

## 2019-07-22 LAB — VITAMIN D 25 HYDROXY (VIT D DEFICIENCY, FRACTURES): Vit D, 25-Hydroxy: 63.7 ng/mL (ref 30.0–100.0)

## 2019-09-07 ENCOUNTER — Other Ambulatory Visit (HOSPITAL_COMMUNITY): Payer: Self-pay | Admitting: Internal Medicine

## 2019-09-08 ENCOUNTER — Other Ambulatory Visit (HOSPITAL_COMMUNITY): Payer: Self-pay | Admitting: *Deleted

## 2019-09-08 MED ORDER — APIXABAN 2.5 MG PO TABS: 2.5000 mg | ORAL_TABLET | Freq: Two times a day (BID) | ORAL | 3 refills | Status: DC

## 2019-10-01 ENCOUNTER — Other Ambulatory Visit (HOSPITAL_COMMUNITY): Payer: Self-pay | Admitting: Adult Health

## 2019-10-03 IMAGING — DX DG ABD PORTABLE 1V
1 series · 1 of 1 positions shown · non-contrast
Comparison: None.

CLINICAL DATA: OG tube placement.

EXAM:
PORTABLE ABDOMEN - 1 VIEW

[abdomen kub]
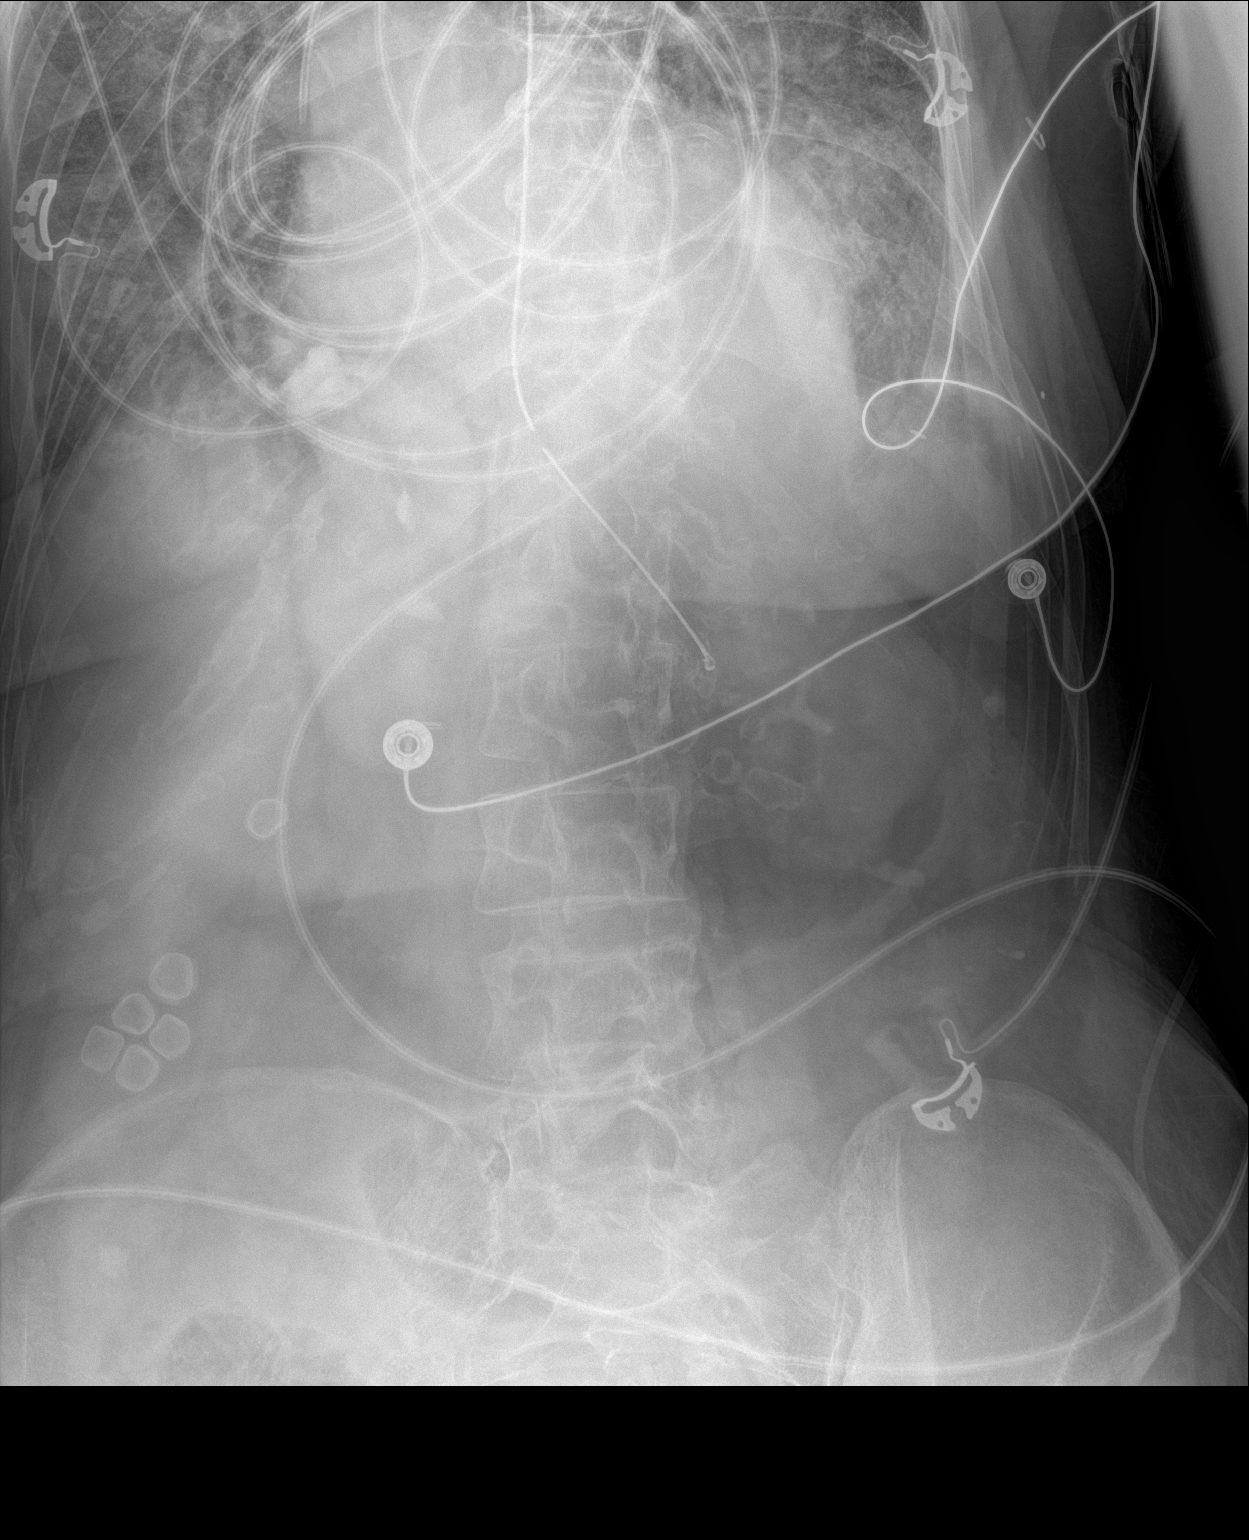

[1 of 1 positions shown; findings below may reference images not displayed]

FINDINGS: An OG tube is noted with tip overlying the proximal-mid stomach.
Side hole lies at the EG junction.

The bowel gas pattern is unremarkable.

LOWER lung airspace disease/consolidation again noted.
IMPRESSION: OG tube with tip overlying the proximal-mid stomach with side hole
at the EG junction. Recommend advancement..

## 2019-10-03 IMAGING — DX DG CHEST 1V PORT SAME DAY
1 series · 1 of 1 positions shown · non-contrast
Comparison: 02/01/2011 chest radiograph

CLINICAL DATA: Pulmonary edema and shortness of breath

EXAM:
PORTABLE CHEST 1 VIEW

[chest ap]
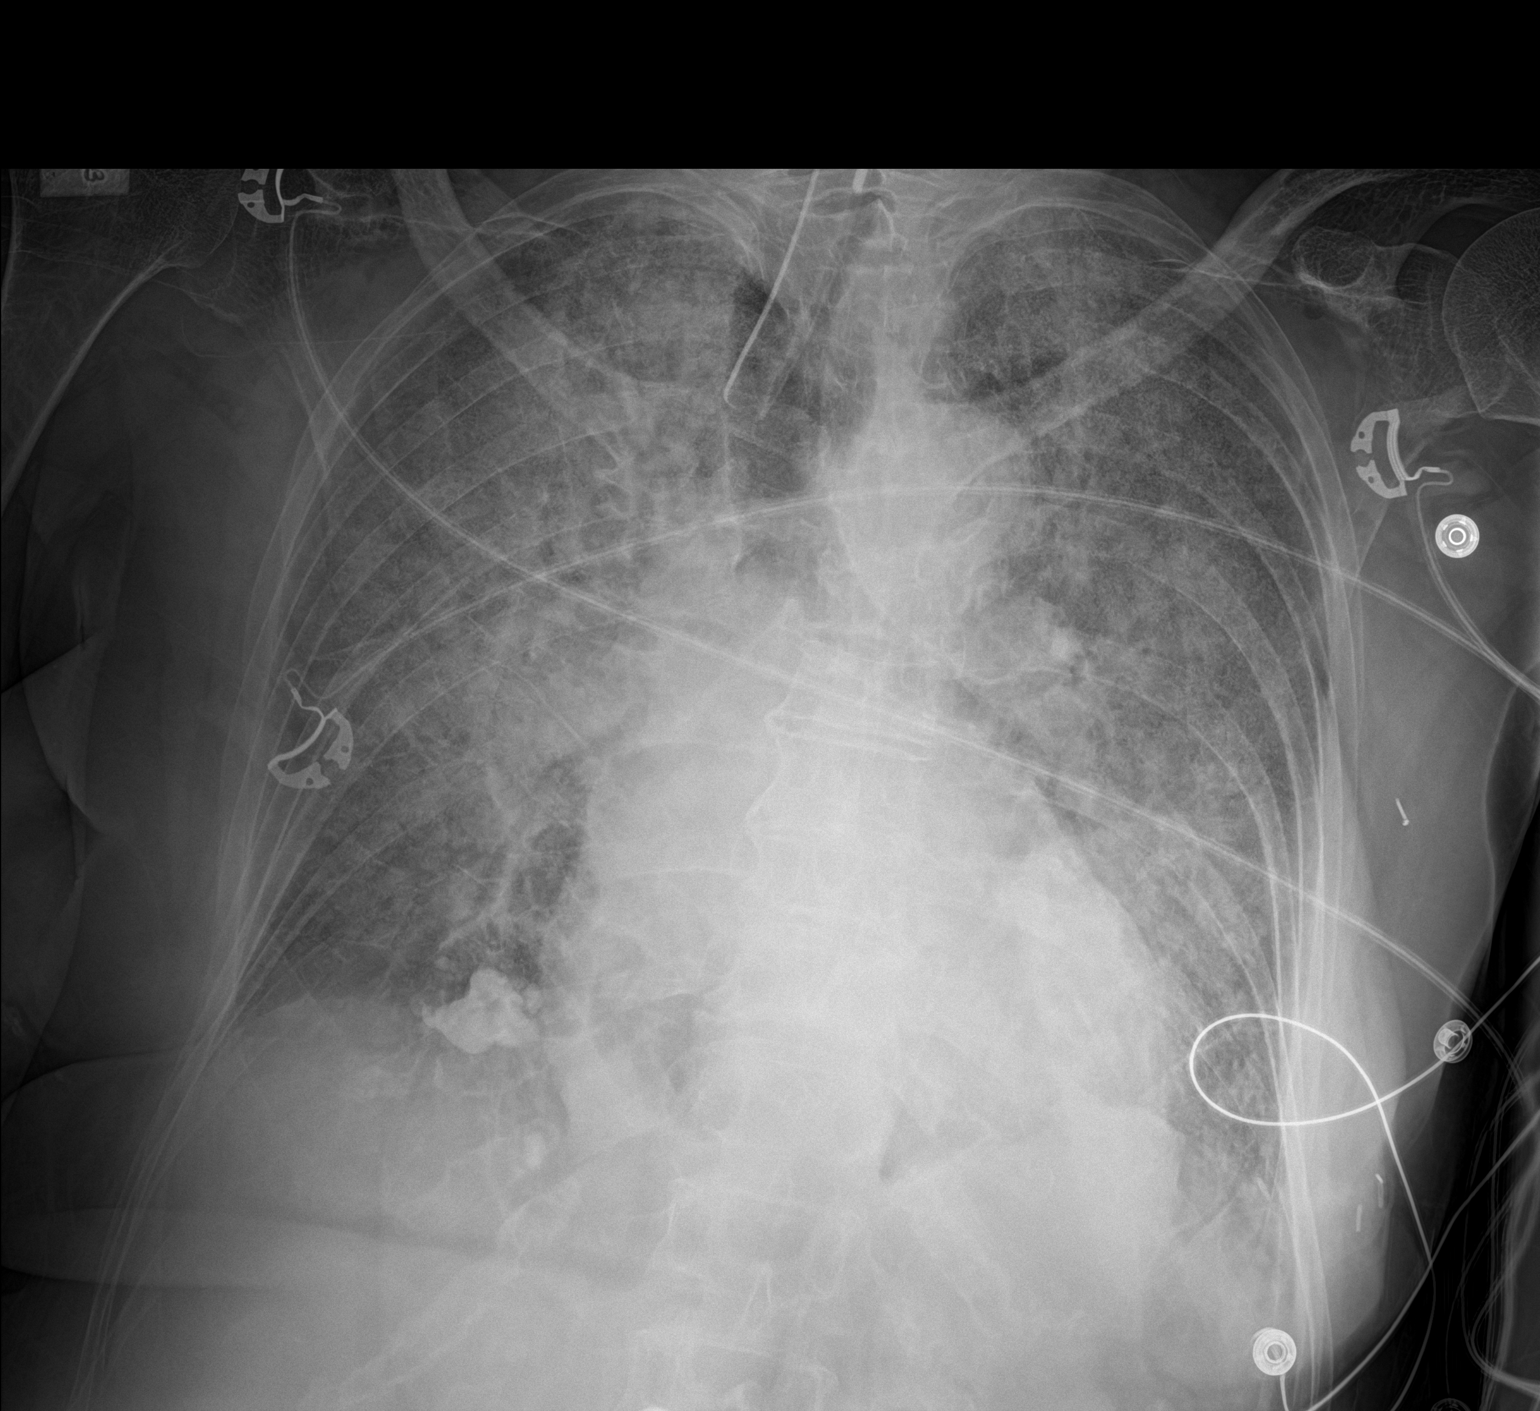

[1 of 1 positions shown; findings below may reference images not displayed]

FINDINGS: An endotracheal tube with tip 4.5 cm above the carina noted.

Cardiomegaly identified.

Diffuse bilateral airspace opacities likely represents edema.

There may be trace bilateral pleural effusions present.

There is no evidence of pneumothorax.
IMPRESSION: Diffuse bilateral airspace opacities likely representing pulmonary
edema.

Cardiomegaly.

## 2019-10-06 DIAGNOSIS — D692 Other nonthrombocytopenic purpura: Secondary | ICD-10-CM | POA: Diagnosis not present

## 2019-10-06 DIAGNOSIS — L720 Epidermal cyst: Secondary | ICD-10-CM | POA: Diagnosis not present

## 2019-10-09 IMAGING — DX DG CHEST 1V PORT
1 series · 1 of 1 positions shown · non-contrast
Comparison: Chest radiograph and chest CT July 31, 2017

CLINICAL DATA: Congestive heart failure

EXAM:
PORTABLE CHEST 1 VIEW

[chest ap]
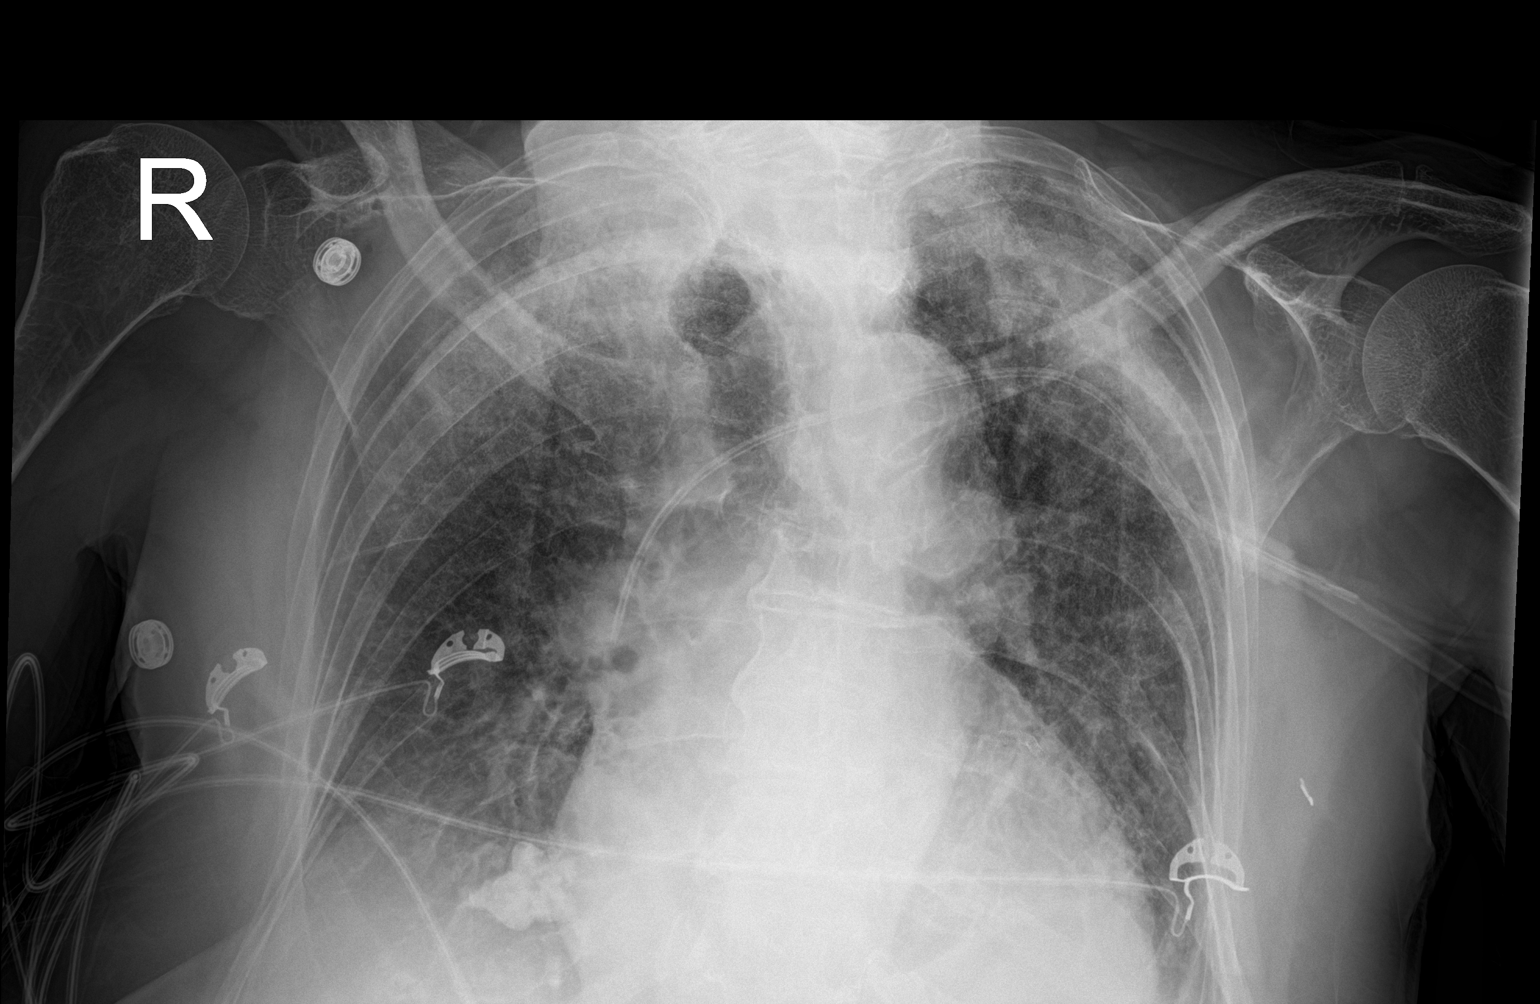

[1 of 1 positions shown; findings below may reference images not displayed]

FINDINGS: Central catheter tip is in the superior vena cava. No pneumothorax.
There is airspace opacity in both upper lobes, slightly increased
from 2 days prior. There is consolidation in the medial right base.
There is patchy atelectasis elsewhere in the lungs.

Heart is enlarged with pulmonary vascularity normal. No adenopathy.
There is aortic atherosclerosis. There remains dystrophic
calcification in the dome of the liver.
IMPRESSION: 1. Increase in airspace opacity in both upper lobes. Suspect
multifocal pneumonia, although atypical distribution of pulmonary
edema is possible.

2. Airspace consolidation concerning for pneumonia medial right
base. Patchy atelectasis throughout both lungs.

3.  Stable cardiomegaly.  Aortic atherosclerosis.

4.  Central catheter tip in superior vena cava.  No pneumothorax.

Aortic Atherosclerosis ([id number]-OS6.6).

## 2019-10-11 IMAGING — DX DG CHEST 1V PORT
1 series · 1 of 1 positions shown · non-contrast
Comparison: Multiple priors, most recently 08/02/2017

CLINICAL DATA: CHF

EXAM:
PORTABLE CHEST 1 VIEW

[chest ap]
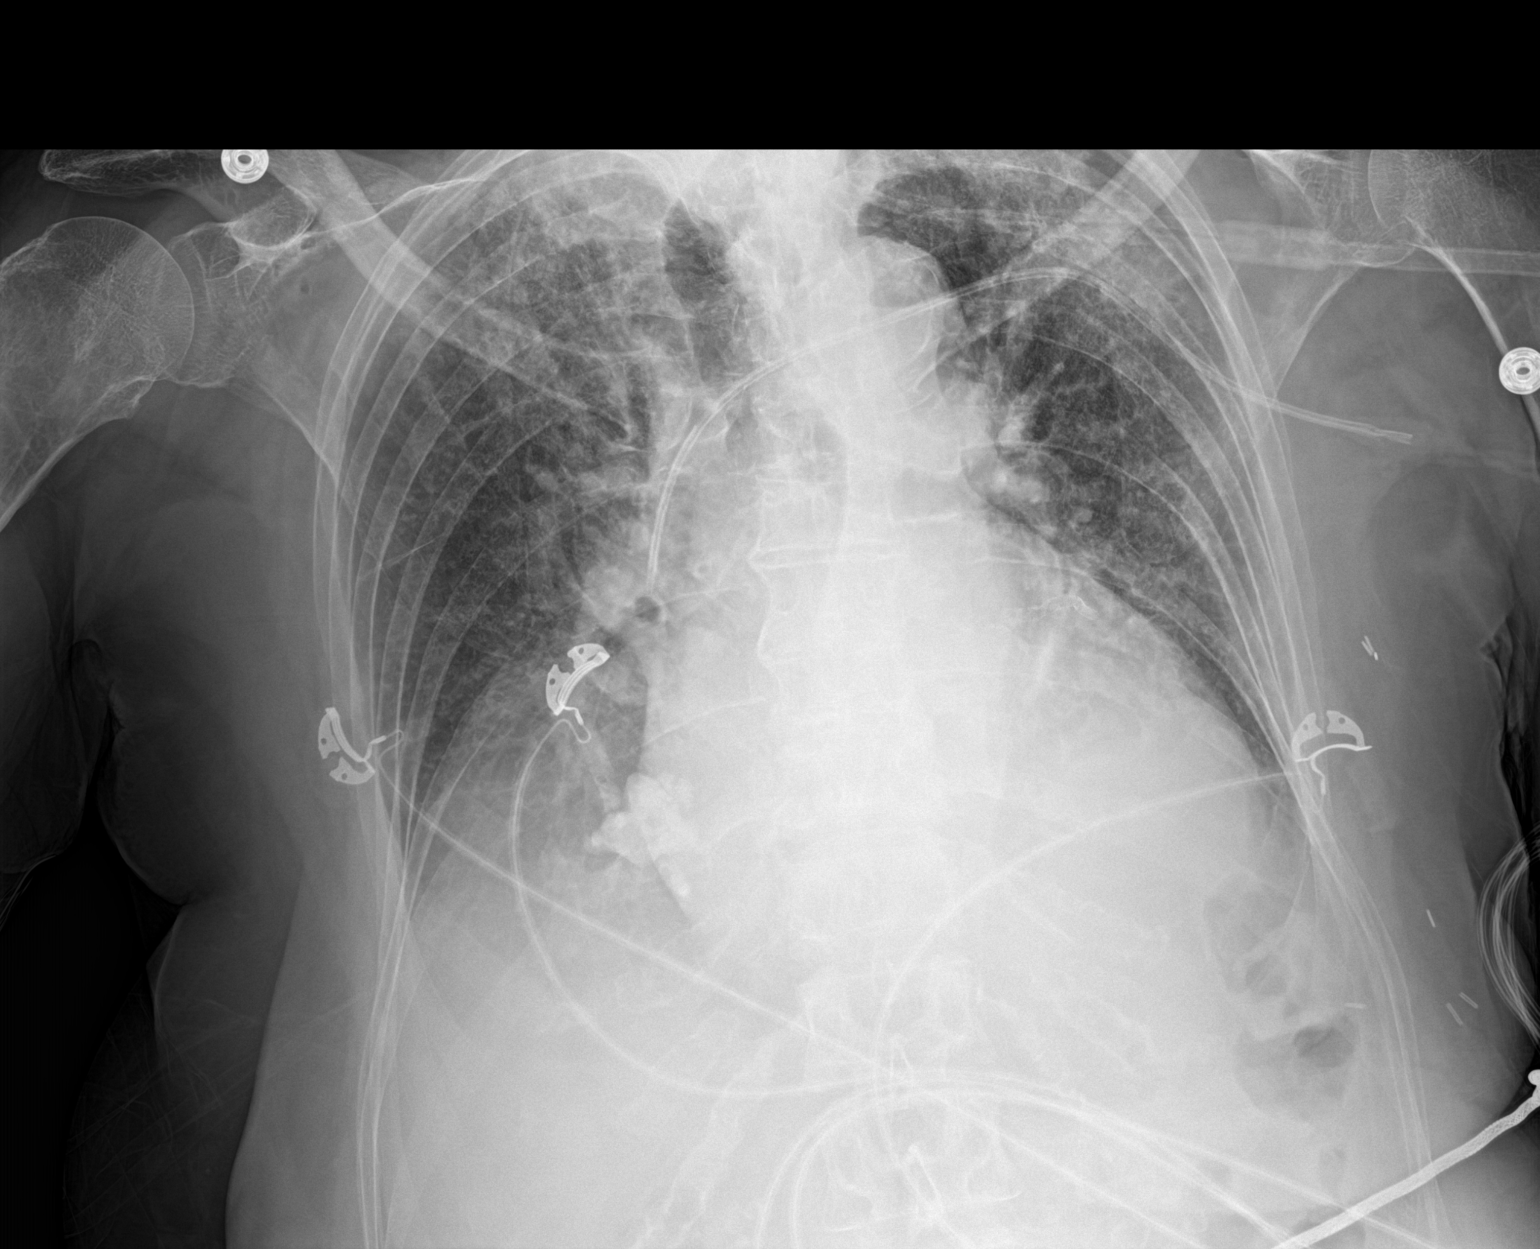

[1 of 1 positions shown; findings below may reference images not displayed]

FINDINGS: Residual patchy opacities in the bilateral upper lobes, less diffuse
than on prior studies. The current distribution is more peripheral
than perihilar, raising concern for underlying infection.

Mild perihilar edema likely persists, although this is significantly
improved. Small bilateral pleural effusions.

Cardiomegaly.

Left subclavian catheter terminating at the cavoatrial junction.
IMPRESSION: Possible patchy peripheral upper lobe opacities, raising concern for
underlying infection.

Cardiomegaly with mild perihilar edema, significantly improved from
priors. Small bilateral pleural effusions.

## 2019-10-24 ENCOUNTER — Other Ambulatory Visit (HOSPITAL_COMMUNITY): Payer: Self-pay | Admitting: Adult Health

## 2019-11-20 ENCOUNTER — Other Ambulatory Visit (HOSPITAL_COMMUNITY): Payer: Self-pay | Admitting: Adult Health

## 2019-11-20 ENCOUNTER — Other Ambulatory Visit (HOSPITAL_COMMUNITY): Payer: Self-pay | Admitting: Internal Medicine

## 2019-12-17 ENCOUNTER — Other Ambulatory Visit (HOSPITAL_COMMUNITY): Payer: Self-pay | Admitting: Internal Medicine

## 2020-01-07 ENCOUNTER — Other Ambulatory Visit (HOSPITAL_COMMUNITY): Payer: Self-pay | Admitting: Internal Medicine

## 2020-01-08 ENCOUNTER — Other Ambulatory Visit (HOSPITAL_COMMUNITY): Payer: Self-pay | Admitting: Adult Health

## 2020-02-01 NOTE — Progress Notes (Signed)
Advanced Heart Failure Clinic Note   Date:  02/01/2020   ID:  [name], DOB 06/25/1934, MRN [id number]  Location: Home  Provider location: [place] [facility] Type of Visit: Established patient  PCP:  [name], MD  Cardiologist:  No primary care provider on file. Primary HF: Clothilde Tippetts  Chief Complaint: Heart Failure follow-up   History of Present Illness:  [name] is a 85 y.o. female with h/o HTN, breast cancer, CAD s/p anterior STEMI 2/19, PAF and systolic HF.   Admitted to [facility]  ED 07/27/17 with CP.Hospital course was complicated by acute respiratory failure, bradycardic arrest, Afib RVR, and cardiogenic shock in the setting of anterior MI. Found to have acute anterior MI and takenurgently to thecath lab. LHC 07/27/17 as below with 3v CAD, LVEF 35-40%, elevated LVEDP. Underwent PCI with successful stenting of the ramus intermediate artery with DES x 2.. As she improved she was extubated on 2/24. An ECHO was completed an showed LVEF 40-45%.   On 06/13/18 she underwent cardioversion for recurrent A fib. She returned to the A fib clinic on 06/19/18 and was back in A Fib.   She presents today for routine f/u. I have not seen her since 9/20. Says she feels good. Gets around the house without much problem but feels dizzy at times. Denies CP. No palpitations. Struggling with occasional dysphagia. Can do ADLS and go to store and do some gardening without too much DOE unless she goes fast. BP at home has been running 116-> 126 with HR 80-90   Echo 9/20 EF 40-45% 3+ MR Personally reviewed  ECHO 12/2017 45-50%  ECHO 07/27/2017 EF 40-45% RVSP 50 mm hg   LHC 07/27/2017  Post Atrio lesion is 90% stenosed.  Prox LAD to Dist LAD lesion is 50% stenosed.  Ost 1st Diag lesion is 95% stenosed.  Ost 2nd Diag to 2nd Diag lesion is 90% stenosed.  Mid Cx lesion is 30% stenosed.  Ost Ramus to Ramus lesion is 100% stenosed.  A drug-eluting stent  was successfully placed using a STENT SYNERGY DES 2.75X28.  A drug-eluting stent was successfully placed using a STENT SYNERGY DES 2.5X12.  Post intervention, there is a 0% residual stenosis.  There is moderate to severe left ventricular systolic dysfunction.  LV end diastolic pressure is moderately elevated.  The left ventricular ejection fraction is 35-45% by visual estimate. 1. 3 vessel obstructive CAD - 100% ramus intermediate artery. This is a large vessel and is the culprit lesion. - 95% small first diagonal - 90% small second diagonal  - diffusely diseased LAD - 90% PLOM    Past Medical History:  Diagnosis Date  . Anxiety   . Breast cancer of lower-outer quadrant of left female breast (HCC) 10/11/2014   treated with lumpectomy and radiation  . Coronary artery disease 07/27/2017  . GERD (gastroesophageal reflux disease)   . Hypertension   . Myocardial infarction (HCC) 07/27/2017   STEMI of anterolateral wall. Cath w/ stent and cabg by Dr. Swaziland  . Osteoporosis 10/20/2014  . Personal history of radiation therapy 2016   treatment of breast cancer  . PONV (postoperative nausea and vomiting)   . PUD (peptic ulcer disease)   . Pure hypercholesterolemia 04/08/2014  . Stone, kidney    Past Surgical History:  Procedure Laterality Date  . BREAST LUMPECTOMY Left 2016  . BREAST LUMPECTOMY WITH SENTINEL LYMPH NODE BIOPSY Left 10/22/2014   Procedure: BREAST LUMPECTOMY WITH SENTINEL  LYMPH NODE BIOPSY;  Surgeon: Chevis Pretty III, MD;  Location: [facility];  Service: General;  Laterality: Left;  . CARDIOVERSION N/A 06/13/2018   Procedure: CARDIOVERSION;  Surgeon: Dolores Patty, MD;  Location: [facility] ENDOSCOPY;  Service: Cardiovascular;  Laterality: N/A;  . CORONARY STENT INTERVENTION N/A 07/27/2017   Procedure: CORONARY STENT INTERVENTION;  Surgeon: Swaziland, Peter M, MD;  Location: [facility] INVASIVE CV LAB;  Service: Cardiovascular;  Laterality: N/A;  .  CORONARY/GRAFT ACUTE MI REVASCULARIZATION N/A 07/27/2017   Procedure: Coronary/Graft Acute MI Revascularization;  Surgeon: Swaziland, Peter M, MD;  Location: [facility] INVASIVE CV LAB;  Service: Cardiovascular;  Laterality: N/A;  . LEFT HEART CATH AND CORONARY ANGIOGRAPHY N/A 07/27/2017   Procedure: LEFT HEART CATH AND CORONARY ANGIOGRAPHY;  Surgeon: Swaziland, Peter M, MD;  Location: [facility] INVASIVE CV LAB;  Service: Cardiovascular;  Laterality: N/A;  . RE-EXCISION OF BREAST CANCER,SUPERIOR MARGINS Left 11/04/2014   Procedure: RE-EXCISION OF LEFT BREAST INFERIOR MARGINS;  Surgeon: Chevis Pretty III, MD;  Location: [facility] OR;  Service: General;  Laterality: Left;     Current Outpatient Medications  Medication Sig Dispense Refill  . anastrozole (ARIMIDEX) 1 MG tablet Take 1 tablet (1 mg total) by mouth daily. 90 tablet 4  . apixaban (ELIQUIS) 2.5 MG TABS tablet Take 1 tablet (2.5 mg total) by mouth 2 (two) times daily. 180 tablet 3  . atorvastatin (LIPITOR) 80 MG tablet TAKE 1 TABLET BY MOUTH ONCE DAILY AT  6PM 90 tablet 0  . Calcium Carb-Cholecalciferol (CALCIUM + D3) 600-800 MG-UNIT TABS Take 1 tablet by mouth every evening.     . Cholecalciferol (VITAMIN D-3) 125 MCG (5000 UT) TABS Take 5,000 Units by mouth every morning. 90 tablet 4  . co-enzyme Q-10 30 MG capsule Take 30 mg by mouth 3 (three) times daily.    Marland Kitchen Fe Bisgly-Vit C-Vit B12-FA (GENTLE IRON PO) Take 1-2 tablets by mouth daily.    . fish oil-omega-3 fatty acids 1000 MG capsule Take 1,200 mg by mouth at bedtime.     . furosemide (LASIX) 20 MG tablet TAKE ONE TABLET BY MOUTH EVERY OTHER DAY ALTERNATING WITH 2 TABLETS EVERY OTHER DAY 135 tablet 0  . losartan (COZAAR) 25 MG tablet Take 1 tablet (25 mg total) by mouth 2 (two) times daily. Must keep pending appt for further refills 180 tablet 0  . Magnesium Oxide (MAG-CAPS PO) Take by mouth. QD    . Multiple Vitamin (MULTIVITAMIN WITH MINERALS) TABS tablet Take 1 tablet by mouth daily.    . nitroGLYCERIN (NITROSTAT)  0.3 MG SL tablet DISSOLVE ONE TABLET UNDER THE TONGUE EVERY 5 MINUTES AS NEEDED FOR CHEST PAIN.  DO NOT EXCEED A TOTAL OF 3 DOSES IN 15 MINUTES NOW 100 tablet 0  . spironolactone (ALDACTONE) 25 MG tablet Take 1 tablet by mouth once daily 90 tablet 0   No current facility-administered medications for this encounter.    Allergies:   Lisinopril   Social History:  The patient  reports that she has never smoked. She has never used smokeless tobacco. She reports that she does not drink alcohol and does not use drugs.   Family History:  The patient's family history includes Heart disease in her brother; Hypertension in her brother, brother, sister, and sister; Pancreatic cancer in her mother; Stroke in her father.   ROS:  Please see the history of present illness.   All other systems are personally reviewed and negative.   Vitals:   02/02/20 0935  BP: Marland Kitchen)  146/76  Pulse: (!) 106  SpO2: 100%  Weight: 62.7 kg (138 lb 4 oz)    Exam:   General:  Well appearing. No resp difficulty HEENT: normal Neck: supple. no JVD. Carotids 2+ bilat; no bruits. No lymphadenopathy or thryomegaly appreciated. Cor: PMI nondisplaced. Irregular tachy 3/6 MR Lungs: clear Abdomen: soft, nontender, nondistended. No hepatosplenomegaly. No bruits or masses. Good bowel sounds. Extremities: no cyanosis, clubbing, rash, edema Neuro: alert & orientedx3, cranial nerves grossly intact. moves all 4 extremities w/o difficulty. Affect pleasant  ECG AF with RVR 103 Personally reviewed   Recent Labs: 02/12/2019: B Natriuretic Peptide 733.3 07/21/2019: ALT 29; BUN 40; Creatinine, Ser 1.77; Hemoglobin 10.6; Platelets 164; Potassium 5.0; Sodium 138  Personally reviewed   Wt Readings from Last 3 Encounters:  07/21/19 64.7 kg (142 lb 9.6 oz)  02/12/19 63 kg (138 lb 12.8 oz)  01/20/19 62.4 kg (137 lb 9.6 oz)      ASSESSMENT AND PLAN:  1. CAD - 3V CAD with STEMI 07/2017 S/P DES x2 to Ramus - No s/s of ischemia - Continue  eliquis, and high dose statin. Off ASA. - Restart carvedilol 3.125 bid   2. Chronic Systolic Heart Failure, ICM- ECHO EF 07/2017 EF 40-45%   Echo 12/2017 EF 45-50%  - Echo 9/20 EF ~40-45% 3+ MR - Volume status stable. Takes lasix 20 3-4x per week.  - Remains NYHA II - Continue losartan 25 mg twice a day. - Continue 25 mg spironolactone daily  - Restart carvedilol 3.125 bid. She has not tolerated well in the past with bradycardia.  - Will see back in 4-6 weeks and attempt to switch losartan to Entresto. Will also get echo to further assess EF and MR -> may benefit from MitraClip  - Cnsider SGLT2i after Entresto - Reinforced fluid restriction to < 2 L daily, sodium restriction to less than 2000 mg daily, and the importance of daily weights.   3. Permanent AF -S/P DC-CV 06/13/2018 to NSR but back in A fib on 1/16  - Remains in A fib. She was intolerant amio due to nausea and tremors.  - Dr Johney Frame was planning PPM insertion, then uptitration of BB. She has deferred this procedure for now.  -  Ventricular response is fast (103 today). Will restart carvedilol at 3.125 bid and place Zio to requantify rates - Previously followed in AF clinic.  - Continue eliquis 2.5 mg BID. No bleeding. Check CBC today  4. HTN - BP ok   5. H.O Left Breast Cancer - No change to current plan.    6. CKD IIIb - Creatinine 1.5-1.8.  - Labs today - Consider SGLT2i in future  7. Mitral regurgitation - ischemic in nature. 3+ MR on echo - plan as above. Repeat echo. consider Clip  Total time spent 35 minutes. Over half that time spent discussing above.    Signed, Arvilla Meres, MD  02/01/2020 10:19 AM  [facility] Failure [facility] [address] Heart [facility] Vascular [place] [address] [phone] (office) [phone] (fax)

## 2020-02-02 ENCOUNTER — Ambulatory Visit (HOSPITAL_COMMUNITY)
Admission: RE | Admit: 2020-02-02 | Discharge: 2020-02-02 | Disposition: A | Discharge status: Home / Self Care | Payer: Medicare Other | Source: Ambulatory Visit | Attending: Internal Medicine | Admitting: Internal Medicine

## 2020-02-02 ENCOUNTER — Other Ambulatory Visit: Payer: Self-pay

## 2020-02-02 VITALS — BP 146/76 | HR 106 | Wt 138.2 lb

## 2020-02-02 DIAGNOSIS — I130 Hypertensive heart and chronic kidney disease with heart failure and stage 1 through stage 4 chronic kidney disease, or unspecified chronic kidney disease: Secondary | ICD-10-CM | POA: Diagnosis not present

## 2020-02-02 DIAGNOSIS — I340 Nonrheumatic mitral (valve) insufficiency: Secondary | ICD-10-CM | POA: Diagnosis not present

## 2020-02-02 DIAGNOSIS — I5022 Chronic systolic (congestive) heart failure: Secondary | ICD-10-CM | POA: Insufficient documentation

## 2020-02-02 DIAGNOSIS — Z955 Presence of coronary angioplasty implant and graft: Secondary | ICD-10-CM | POA: Insufficient documentation

## 2020-02-02 DIAGNOSIS — Z79899 Other long term (current) drug therapy: Secondary | ICD-10-CM | POA: Diagnosis not present

## 2020-02-02 DIAGNOSIS — E7800 Pure hypercholesterolemia, unspecified: Secondary | ICD-10-CM | POA: Insufficient documentation

## 2020-02-02 DIAGNOSIS — I4891 Unspecified atrial fibrillation: Secondary | ICD-10-CM

## 2020-02-02 DIAGNOSIS — Z888 Allergy status to other drugs, medicaments and biological substances status: Secondary | ICD-10-CM | POA: Insufficient documentation

## 2020-02-02 DIAGNOSIS — I255 Ischemic cardiomyopathy: Secondary | ICD-10-CM | POA: Diagnosis not present

## 2020-02-02 DIAGNOSIS — Z8674 Personal history of sudden cardiac arrest: Secondary | ICD-10-CM | POA: Diagnosis not present

## 2020-02-02 DIAGNOSIS — Z8249 Family history of ischemic heart disease and other diseases of the circulatory system: Secondary | ICD-10-CM | POA: Insufficient documentation

## 2020-02-02 DIAGNOSIS — I4821 Permanent atrial fibrillation: Secondary | ICD-10-CM | POA: Insufficient documentation

## 2020-02-02 DIAGNOSIS — R1310 Dysphagia, unspecified: Secondary | ICD-10-CM | POA: Insufficient documentation

## 2020-02-02 DIAGNOSIS — Z7901 Long term (current) use of anticoagulants: Secondary | ICD-10-CM | POA: Insufficient documentation

## 2020-02-02 DIAGNOSIS — K219 Gastro-esophageal reflux disease without esophagitis: Secondary | ICD-10-CM | POA: Diagnosis not present

## 2020-02-02 DIAGNOSIS — Z8711 Personal history of peptic ulcer disease: Secondary | ICD-10-CM | POA: Diagnosis not present

## 2020-02-02 DIAGNOSIS — I252 Old myocardial infarction: Secondary | ICD-10-CM | POA: Diagnosis not present

## 2020-02-02 DIAGNOSIS — N1832 Chronic kidney disease, stage 3b: Secondary | ICD-10-CM | POA: Insufficient documentation

## 2020-02-02 DIAGNOSIS — Z951 Presence of aortocoronary bypass graft: Secondary | ICD-10-CM | POA: Diagnosis not present

## 2020-02-02 DIAGNOSIS — Z923 Personal history of irradiation: Secondary | ICD-10-CM | POA: Insufficient documentation

## 2020-02-02 DIAGNOSIS — Z853 Personal history of malignant neoplasm of breast: Secondary | ICD-10-CM | POA: Insufficient documentation

## 2020-02-02 DIAGNOSIS — I2510 Atherosclerotic heart disease of native coronary artery without angina pectoris: Secondary | ICD-10-CM | POA: Insufficient documentation

## 2020-02-02 DIAGNOSIS — Z87442 Personal history of urinary calculi: Secondary | ICD-10-CM | POA: Diagnosis not present

## 2020-02-02 LAB — BASIC METABOLIC PANEL
Anion gap: 10 (ref 5–15)
BUN: 45 mg/dL — ABNORMAL HIGH (ref 8–23)
CO2: 24 mmol/L (ref 22–32)
Calcium: 11.9 mg/dL — ABNORMAL HIGH (ref 8.9–10.3)
Chloride: 103 mmol/L (ref 98–111)
Creatinine, Ser: 2.16 mg/dL — ABNORMAL HIGH (ref 0.44–1.00)
GFR calc Af Amer: 24 mL/min — ABNORMAL LOW (ref >60)
GFR calc non Af Amer: 21 mL/min — ABNORMAL LOW (ref >60)
Glucose, Bld: 94 mg/dL (ref 70–99)
Potassium: 4.9 mmol/L (ref 3.5–5.1)
Sodium: 137 mmol/L (ref 135–145)

## 2020-02-02 LAB — CBC
HCT: 32.7 % — ABNORMAL LOW (ref 36.0–46.0)
Hemoglobin: 9.8 g/dL — ABNORMAL LOW (ref 12.0–15.0)
MCH: 30.2 pg (ref 26.0–34.0)
MCHC: 30 g/dL (ref 30.0–36.0)
MCV: 100.6 fL — ABNORMAL HIGH (ref 80.0–100.0)
Platelets: 214 10*3/uL (ref 150–400)
RBC: 3.25 MIL/uL — ABNORMAL LOW (ref 3.87–5.11)
RDW: 13.4 % (ref 11.5–15.5)
WBC: 3.6 10*3/uL — ABNORMAL LOW (ref 4.0–10.5)
nRBC: 0 % (ref 0.0–0.2)

## 2020-02-02 LAB — BRAIN NATRIURETIC PEPTIDE: B Natriuretic Peptide: 568.6 pg/mL — ABNORMAL HIGH (ref 0.0–100.0)

## 2020-02-02 MED ORDER — CARVEDILOL 3.125 MG PO TABS: 3.1250 mg | ORAL_TABLET | Freq: Two times a day (BID) | ORAL | 3 refills | Status: DC

## 2020-02-02 NOTE — Addendum Note (Signed)
Encounter addended by: Malena Edman, RN on: 02/02/2020 11:11 AM  Actions taken: Clinical Note Signed

## 2020-02-02 NOTE — Progress Notes (Signed)
Zio patch placed onto patient.  All instructions and information reviewed with patient, they verbalize understanding with no questions. 

## 2020-02-02 NOTE — Addendum Note (Signed)
Encounter addended by: Jolaine Artist, MD on: 02/02/2020 11:05 AM  Actions taken: Level of Service modified, Visit diagnoses modified

## 2020-02-02 NOTE — Patient Instructions (Signed)
START Carvedilol 3.125mg  Twice daily  Labs done today, your results will be available in MyChart, we will contact you for abnormal readings.  Your provider has recommended that  you wear a Zio Patch for 14 days.  This monitor will record your heart rhythm for our review.  IF you have any symptoms while wearing the monitor please press the button.  If you have any issues with the patch or you notice a red or orange light on it please call the company at [phone].  Once you remove the patch please mail it back to the company as soon as possible so we can get the results.  Your physician recommends that you schedule your follow up appointment with an echocardiogram  Your physician has requested that you have an echocardiogram. Echocardiography is a painless test that uses sound waves to create images of your heart. It provides your doctor with information about the size and shape of your heart and how well your heart's chambers and valves are working. This procedure takes approximately one hour. There are no restrictions for this procedure.  If you have any questions or concerns before your next appointment please send [place] a message through [place] or call our office at [phone].    TO LEAVE A MESSAGE FOR THE NURSE SELECT OPTION 2, PLEASE LEAVE A MESSAGE INCLUDING: . YOUR NAME . DATE OF BIRTH . CALL BACK NUMBER . REASON FOR CALL**this is important as we prioritize the call backs  [place] AS LONG AS YOU CALL BEFORE 4:00 PM  At the [facility], you and your health needs are our priority. As part of our continuing mission to provide you with exceptional heart care, we have created designated Provider Care Teams. These Care Teams include your primary Cardiologist (physician) and Advanced Practice Providers (APPs- Physician Assistants and Nurse Practitioners) who all work together to provide you with the care you need, when you need it.   You  may see any of the following providers on your designated Care Team at your next follow up: Marland Kitchen Dr Glori Bickers . Dr Loralie Champagne . Darrick Grinder, NP . Lyda Jester, PA . Audry Riles, PharmD   Please be sure to bring in all your medications bottles to every appointment.

## 2020-02-10 ENCOUNTER — Telehealth (HOSPITAL_COMMUNITY): Payer: Self-pay | Admitting: Cardiology

## 2020-02-10 NOTE — Telephone Encounter (Signed)
Patient left voicemail on triage line reporting she is having issues with zio patch  Attempted to return call- line busy x 2 [phone]

## 2020-02-11 ENCOUNTER — Other Ambulatory Visit (HOSPITAL_COMMUNITY): Payer: Self-pay | Admitting: Cardiology

## 2020-02-11 NOTE — Progress Notes (Signed)
Opened in error

## 2020-02-19 ENCOUNTER — Other Ambulatory Visit: Payer: Self-pay | Admitting: Oncology

## 2020-02-24 NOTE — Addendum Note (Signed)
Encounter addended by: Micki Riley, RN on: 02/24/2020 10:13 AM  Actions taken: Imaging Exam ended

## 2020-03-01 ENCOUNTER — Other Ambulatory Visit: Payer: Self-pay

## 2020-03-01 ENCOUNTER — Inpatient Hospital Stay: Payer: Medicare Other

## 2020-03-01 ENCOUNTER — Inpatient Hospital Stay: Payer: Medicare Other | Attending: Oncology | Admitting: Oncology

## 2020-03-01 ENCOUNTER — Other Ambulatory Visit: Payer: Self-pay | Admitting: *Deleted

## 2020-03-01 VITALS — BP 126/61 | HR 81 | Temp 97.0°F | Resp 17 | Ht 68.0 in | Wt 138.7 lb

## 2020-03-01 DIAGNOSIS — M818 Other osteoporosis without current pathological fracture: Secondary | ICD-10-CM

## 2020-03-01 DIAGNOSIS — Z853 Personal history of malignant neoplasm of breast: Secondary | ICD-10-CM | POA: Insufficient documentation

## 2020-03-01 DIAGNOSIS — Z170 Estrogen receptor positive status [ER+]: Secondary | ICD-10-CM

## 2020-03-01 DIAGNOSIS — C50512 Malignant neoplasm of lower-outer quadrant of left female breast: Secondary | ICD-10-CM

## 2020-03-01 DIAGNOSIS — M810 Age-related osteoporosis without current pathological fracture: Secondary | ICD-10-CM | POA: Insufficient documentation

## 2020-03-01 LAB — CBC WITH DIFFERENTIAL/PLATELET
Abs Immature Granulocytes: 0.01 10*3/uL (ref 0.00–0.07)
Basophils Absolute: 0 10*3/uL (ref 0.0–0.1)
Basophils Relative: 1 %
Eosinophils Absolute: 0 10*3/uL (ref 0.0–0.5)
Eosinophils Relative: 1 %
HCT: 30.7 % — ABNORMAL LOW (ref 36.0–46.0)
Hemoglobin: 9.7 g/dL — ABNORMAL LOW (ref 12.0–15.0)
Immature Granulocytes: 0 %
Lymphocytes Relative: 14 %
Lymphs Abs: 0.6 10*3/uL — ABNORMAL LOW (ref 0.7–4.0)
MCH: 30.8 pg (ref 26.0–34.0)
MCHC: 31.6 g/dL (ref 30.0–36.0)
MCV: 97.5 fL (ref 80.0–100.0)
Monocytes Absolute: 0.5 10*3/uL (ref 0.1–1.0)
Monocytes Relative: 12 %
Neutro Abs: 2.8 10*3/uL (ref 1.7–7.7)
Neutrophils Relative %: 72 %
Platelets: 200 10*3/uL (ref 150–400)
RBC: 3.15 MIL/uL — ABNORMAL LOW (ref 3.87–5.11)
RDW: 12.8 % (ref 11.5–15.5)
WBC: 3.9 10*3/uL — ABNORMAL LOW (ref 4.0–10.5)
nRBC: 0 % (ref 0.0–0.2)

## 2020-03-01 LAB — CMP (CANCER CENTER ONLY)
ALT: 40 U/L (ref 0–44)
AST: 34 U/L (ref 15–41)
Albumin: 3.6 g/dL (ref 3.5–5.0)
Alkaline Phosphatase: 74 U/L (ref 38–126)
Anion gap: 9 (ref 5–15)
BUN: 48 mg/dL — ABNORMAL HIGH (ref 8–23)
CO2: 26 mmol/L (ref 22–32)
Calcium: 12 mg/dL — ABNORMAL HIGH (ref 8.9–10.3)
Chloride: 104 mmol/L (ref 98–111)
Creatinine: 2.52 mg/dL — ABNORMAL HIGH (ref 0.44–1.00)
GFR, Est AFR Am: 20 mL/min — ABNORMAL LOW (ref >60)
GFR, Estimated: 17 mL/min — ABNORMAL LOW (ref >60)
Glucose, Bld: 146 mg/dL — ABNORMAL HIGH (ref 70–99)
Potassium: 4.1 mmol/L (ref 3.5–5.1)
Sodium: 139 mmol/L (ref 135–145)
Total Bilirubin: 0.8 mg/dL (ref 0.3–1.2)
Total Protein: 7.8 g/dL (ref 6.5–8.1)

## 2020-03-01 MED ORDER — DENOSUMAB 60 MG/ML ~~LOC~~ SOSY
PREFILLED_SYRINGE | SUBCUTANEOUS | Status: AC
  Filled 2020-03-01: qty 1

## 2020-03-01 MED ORDER — DENOSUMAB 60 MG/ML ~~LOC~~ SOSY
60.0000 mg | PREFILLED_SYRINGE | Freq: Once | SUBCUTANEOUS | Status: AC
  Administered 2020-03-01: 60 mg via SUBCUTANEOUS

## 2020-03-01 NOTE — Patient Instructions (Signed)
Denosumab injection What is this medicine? DENOSUMAB (den oh sue mab) slows bone breakdown. Prolia is used to treat osteoporosis in women after menopause and in men, and in people who are taking corticosteroids for 6 months or more. Xgeva is used to treat a high calcium level due to cancer and to prevent bone fractures and other bone problems caused by multiple myeloma or cancer bone metastases. Xgeva is also used to treat giant cell tumor of the bone. This medicine may be used for other purposes; ask your health care provider or pharmacist if you have questions. COMMON BRAND NAME(S): Prolia, XGEVA What should I tell my health care provider before I take this medicine? They need to know if you have any of these conditions:  dental disease  having surgery or tooth extraction  infection  kidney disease  low levels of calcium or Vitamin D in the blood  malnutrition  on hemodialysis  skin conditions or sensitivity  thyroid or parathyroid disease  an unusual reaction to denosumab, other medicines, foods, dyes, or preservatives  pregnant or trying to get pregnant  breast-feeding How should I use this medicine? This medicine is for injection under the skin. It is given by a health care professional in a hospital or clinic setting. A special MedGuide will be given to you before each treatment. Be sure to read this information carefully each time. For Prolia, talk to your pediatrician regarding the use of this medicine in children. Special care may be needed. For Xgeva, talk to your pediatrician regarding the use of this medicine in children. While this drug may be prescribed for children as young as 13 years for selected conditions, precautions do apply. Overdosage: If you think you have taken too much of this medicine contact a poison control center or emergency room at once. NOTE: This medicine is only for you. Do not share this medicine with others. What if I miss a dose? It is  important not to miss your dose. Call your doctor or health care professional if you are unable to keep an appointment. What may interact with this medicine? Do not take this medicine with any of the following medications:  other medicines containing denosumab This medicine may also interact with the following medications:  medicines that lower your chance of fighting infection  steroid medicines like prednisone or cortisone This list may not describe all possible interactions. Give your health care provider a list of all the medicines, herbs, non-prescription drugs, or dietary supplements you use. Also tell them if you smoke, drink alcohol, or use illegal drugs. Some items may interact with your medicine. What should I watch for while using this medicine? Visit your doctor or health care professional for regular checks on your progress. Your doctor or health care professional may order blood tests and other tests to see how you are doing. Call your doctor or health care professional for advice if you get a fever, chills or sore throat, or other symptoms of a cold or flu. Do not treat yourself. This drug may decrease your body's ability to fight infection. Try to avoid being around people who are sick. You should make sure you get enough calcium and vitamin D while you are taking this medicine, unless your doctor tells you not to. Discuss the foods you eat and the vitamins you take with your health care professional. See your dentist regularly. Brush and floss your teeth as directed. Before you have any dental work done, tell your dentist you are   receiving this medicine. Do not become pregnant while taking this medicine or for 5 months after stopping it. Talk with your doctor or health care professional about your birth control options while taking this medicine. Women should inform their doctor if they wish to become pregnant or think they might be pregnant. There is a potential for serious side  effects to an unborn child. Talk to your health care professional or pharmacist for more information. What side effects may I notice from receiving this medicine? Side effects that you should report to your doctor or health care professional as soon as possible:  allergic reactions like skin rash, itching or hives, swelling of the face, lips, or tongue  bone pain  breathing problems  dizziness  jaw pain, especially after dental work  redness, blistering, peeling of the skin  signs and symptoms of infection like fever or chills; cough; sore throat; pain or trouble passing urine  signs of low calcium like fast heartbeat, muscle cramps or muscle pain; pain, tingling, numbness in the hands or feet; seizures  unusual bleeding or bruising  unusually weak or tired Side effects that usually do not require medical attention (report to your doctor or health care professional if they continue or are bothersome):  constipation  diarrhea  headache  joint pain  loss of appetite  muscle pain  runny nose  tiredness  upset stomach This list may not describe all possible side effects. Call your doctor for medical advice about side effects. You may report side effects to FDA at 1-800-FDA-1088. Where should I keep my medicine? This medicine is only given in a clinic, doctor's office, or other health care setting and will not be stored at home. NOTE: This sheet is a summary. It may not cover all possible information. If you have questions about this medicine, talk to your doctor, pharmacist, or health care provider.  2020 Elsevier/Gold Standard (2017-09-27 16:10:44)

## 2020-03-01 NOTE — Progress Notes (Signed)
[facility]  [phone] Fax:([phone]     ID: [name] DOB: [birth year]  MR#: [id number]  EXB#:[id number]  Patient Care Team: [name], MD as PCP - General (Internal Medicine) Bensimhon, Bevelyn Buckles, MD as PCP - Advanced Heart Failure (Cardiology) [name], MD as Consulting Physician (Gastroenterology) [name], [name], MD as Consulting Physician (Oncology) [name], MD as Consulting Physician (Cardiology) OTHER MD:  CHIEF COMPLAINT: Estrogen receptor positive breast cancer  CURRENT TREATMENT: Completing 5 years of anastrozole, continuing denosumab/Prolia   INTERVAL HISTORY: [name] returns today for follow-up of her estrogen receptor positive breast cancer.   She continues on anastrozole, with good tolerance. She denies issues with hot flashes. Vaginal dryness is not a major issue.   She received Prolia for 3 doses between August 2019 and August 2020.  This was then interrupted because of the pandemic.  Her most recent bone density screening was on 09/28/2016 and showed a T-score of -3.3.  Repeat has been ordered but not scheduled  Recall she had a heart attack in February 2019. Her most recent echocardiogram was on 02/12/2019 and showed an ejection fraction of 40-45%.  Since her last visit, she has not undergone any additional studies. She is overdue for annual mammography, with her most recent being performed at [facility] on 02/14/2018.   REVIEW OF SYSTEMS: Dmia tells me her husband died just before midnight last night.  He had not been feeling well for several weeks.  He tells me he finally saw his doctor and was set up for a study.  Then he went home, died and the following morning they learn that the study had shown a clot in his lungs.  She herself is on Eliquis, with no bleeding problems.  She is not able to walk up a slope.  There are no stairs in her home.  She has had the Pfizer vaccine x2 and plans to receive the  booster.  A detailed review of systems today is otherwise stable   BREAST CANCER HISTORY: From the original intake note:  [name] noted a palpable mass in the left breast and brought it to her primary care physician's attention. On 09/27/2014 the patient underwent bilateral diagnostic mammography with tomosynthesis and left breast ultrasonography. The breast density was category B. In the outer left breast area there was an irregularly shaped mass not associated with suspicious microcalcifications or distortion. The previously described large of fibroadenolipoma in the right breast was unchanged. On physical exam there was a firm palpable mass at the 3:30 position in the left breast 2 cm prone the nipple which, on targeted ultrasound, was irregular, hypoechoic, and measured 2.0 cm. Ultrasound of the left axilla was negative.  Biopsy of this mass 10/05/2014 showed ([id number]) an invasive ductal carcinoma him a grade 1, estrogen receptor 90% positive, progesterone receptor 90% positive, both with strong staining intensity, with an MIB-1 of 5%, and no HER-2 amplification, the signals ratio being 1.06 and the number per cell 1.70.  The patient's subsequent history is as detailed below   PAST MEDICAL HISTORY: Past Medical History:  Diagnosis Date  . Anxiety   . Breast cancer of lower-outer quadrant of left female breast (HCC) 10/11/2014   treated with lumpectomy and radiation  . Coronary artery disease 07/27/2017  . GERD (gastroesophageal reflux disease)   . Hypertension   . Myocardial infarction (HCC) 07/27/2017   STEMI of anterolateral wall. Cath w/ stent and cabg by Dr. Swaziland  .  Osteoporosis 10/20/2014  . Personal history of radiation therapy 2016   treatment of breast cancer  . PONV (postoperative nausea and vomiting)   . PUD (peptic ulcer disease)   . Pure hypercholesterolemia 04/08/2014  . Stone, kidney     PAST SURGICAL HISTORY: Past Surgical History:  Procedure Laterality Date  .  BREAST LUMPECTOMY Left 2016  . BREAST LUMPECTOMY WITH SENTINEL LYMPH NODE BIOPSY Left 10/22/2014   Procedure: BREAST LUMPECTOMY WITH SENTINEL LYMPH NODE BIOPSY;  Surgeon: Chevis Pretty III, MD;  Location: [facility];  Service: General;  Laterality: Left;  . CARDIOVERSION N/A 06/13/2018   Procedure: CARDIOVERSION;  Surgeon: Dolores Patty, MD;  Location: [name];  Service: Cardiovascular;  Laterality: N/A;  . CORONARY STENT INTERVENTION N/A 07/27/2017   Procedure: CORONARY STENT INTERVENTION;  Surgeon: Swaziland, Peter M, MD;  Location: [facility] INVASIVE CV LAB;  Service: Cardiovascular;  Laterality: N/A;  . CORONARY/GRAFT ACUTE MI REVASCULARIZATION N/A 07/27/2017   Procedure: Coronary/Graft Acute MI Revascularization;  Surgeon: Swaziland, Peter M, MD;  Location: [facility] INVASIVE CV LAB;  Service: Cardiovascular;  Laterality: N/A;  . LEFT HEART CATH AND CORONARY ANGIOGRAPHY N/A 07/27/2017   Procedure: LEFT HEART CATH AND CORONARY ANGIOGRAPHY;  Surgeon: Swaziland, Peter M, MD;  Location: [facility] INVASIVE CV LAB;  Service: Cardiovascular;  Laterality: N/A;  . RE-EXCISION OF BREAST CANCER,SUPERIOR MARGINS Left 11/04/2014   Procedure: RE-EXCISION OF LEFT BREAST INFERIOR MARGINS;  Surgeon: Chevis Pretty III, MD;  Location: [facility] OR;  Service: General;  Laterality: Left;    FAMILY HISTORY Family History  Problem Relation Age of Onset  . Pancreatic cancer Mother        pancreastic  . Stroke Father   . Hypertension Brother   . Hypertension Sister   . Hypertension Brother   . Heart disease Brother   . Hypertension Sister    the patient's father died following a stroke at age 78. The patient's mother died from pancreatic cancer the age of 36. The patient had 5 brothers, 2 sisters. There is no history of breast or ovarian cancer in the family to the patient's knowledge    GYNECOLOGIC HISTORY:  No LMP recorded. Patient is postmenopausal.  menarche age 7, first live birth age 26, the patient is GX P4. She went  through the change of life in her late 81s. She took hormone replacement for approximately 3 years. She also took oral contraceptives for a few years remotely, with no complications.    SOCIAL HISTORY: (Updated September 2021. 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 Her husband Jonny Ruiz was a Scientist, water quality.  He died at home unexpectedly 22-Mar-2020.  Daughter William Hamburger lives in  [place], near [place], where she Museum/gallery exhibitions officer. Son Rodell Perna lives in [place] and works in Sales promotion account executive. Son [name] also lives in [place]. He is a Armed forces technical officer with [employer]. Daughter Hardie Shackleton lives in [place] and teaches in [employer] [address] (English and [place]). The patient has 11 grandchildren. She attends a local [employer]     ADVANCED DIRECTIVES:  the patient has named her son [name] as healthcare power of attorney. He can be reached at [phone].    HEALTH MAINTENANCE: Social History   Tobacco Use  . Smoking status: Never Smoker  . Smokeless tobacco: Never Used  Substance Use Topics  . Alcohol use: No  . Drug use: No     Colonoscopy: Due 2019?  PAP:  Bone density:April 2018  Allergies  Allergen Reactions  . Lisinopril Cough    Current Outpatient Medications  Medication Sig Dispense Refill  . anastrozole (ARIMIDEX) 1 MG tablet Take 1 tablet by mouth once daily 90 tablet 0  . apixaban (ELIQUIS) 2.5 MG TABS tablet Take 1 tablet (2.5 mg total) by mouth 2 (two) times daily. 180 tablet 3  . atorvastatin (LIPITOR) 80 MG tablet TAKE 1 TABLET BY MOUTH ONCE DAILY AT  6PM 90 tablet 0  . Calcium Carb-Cholecalciferol (CALCIUM + D3) 600-800 MG-UNIT TABS Take 1 tablet by mouth every evening.     . carvedilol (COREG) 3.125 MG tablet Take 1 tablet (3.125 mg total) by mouth 2 (two) times daily with a meal. 60 tablet 3  . Cholecalciferol (VITAMIN D-3) 125 MCG (5000 UT) TABS Take 5,000 Units by mouth every morning. 90  tablet 4  . co-enzyme Q-10 30 MG capsule Take 30 mg by mouth 3 (three) times daily.    Marland Kitchen Fe Bisgly-Vit C-Vit B12-FA (GENTLE IRON PO) Take 1-2 tablets by mouth daily.    . fish oil-omega-3 fatty acids 1000 MG capsule Take 1,200 mg by mouth at bedtime.     . furosemide (LASIX) 20 MG tablet Take 20 mg by mouth as needed.    Marland Kitchen losartan (COZAAR) 25 MG tablet Take 1 tablet (25 mg total) by mouth 2 (two) times daily. Must keep pending appt for further refills 180 tablet 0  . Magnesium Oxide (MAG-CAPS PO) Take by mouth. QD    . Multiple Vitamin (MULTIVITAMIN WITH MINERALS) TABS tablet Take 1 tablet by mouth daily.    . nitroGLYCERIN (NITROSTAT) 0.3 MG SL tablet DISSOLVE ONE TABLET UNDER THE TONGUE EVERY 5 MINUTES AS NEEDED FOR CHEST PAIN.  DO NOT EXCEED A TOTAL OF 3 DOSES IN 15 MINUTES NOW 100 tablet 0  . spironolactone (ALDACTONE) 25 MG tablet Take 1 tablet by mouth once daily 90 tablet 0   No current facility-administered medications for this visit.    OBJECTIVE: African-American woman who appears younger than stated age  Vitals:   03/01/20 1402  BP: 126/61  Pulse: 81  Resp: 17  Temp: (!) 97 F (36.1 C)  SpO2: 100%     Body mass index is 21.09 kg/m.    ECOG FS:1 - Symptomatic but completely ambulatory  Sclerae unicteric, EOMs intact Wearing a mask No cervical or supraclavicular adenopathy Lungs no rales or rhonchi Heart regular rate and rhythm Abd soft, nontender, positive bowel sounds MSK no focal spinal tenderness, no upper extremity lymphedema Neuro: nonfocal, well oriented, remarkably even affect Breasts: The right breast continues to be difficult to examine because of lumpiness.  The left breast is unremarkable aside from being smaller than the right.  Both axillae are benign.    LAB RESULTS:  CMP     Component Value Date/Time   NA 137 02/02/2020 1011   NA 138 07/21/2019 1019   NA 143 05/21/2017 0959   K 4.9 02/02/2020 1011   K 4.0 05/21/2017 0959   CL 103 02/02/2020  1011   CO2 24 02/02/2020 1011   CO2 26 05/21/2017 0959   GLUCOSE 94 02/02/2020 1011   GLUCOSE 86 05/21/2017 0959   BUN 45 (H) 02/02/2020 1011   BUN 40 (H) 07/21/2019 1019   BUN 16.7 05/21/2017 0959   CREATININE 2.16 (H) 02/02/2020 1011   CREATININE 1.1 05/21/2017 0959   CALCIUM 11.9 (H) 02/02/2020 1011   CALCIUM 10.3 05/21/2017 0959   PROT 7.0 07/21/2019 1019   PROT  6.9 05/21/2017 0959   ALBUMIN 4.6 07/21/2019 1019   ALBUMIN 3.7 05/21/2017 0959   AST 28 07/21/2019 1019   AST 18 05/21/2017 0959   ALT 29 07/21/2019 1019   ALT 14 05/21/2017 0959   ALKPHOS 47 07/21/2019 1019   ALKPHOS 42 05/21/2017 0959   BILITOT 0.9 07/21/2019 1019   BILITOT 0.61 05/21/2017 0959   GFRNONAA 21 (L) 02/02/2020 1011   GFRNONAA 63 09/09/2014 1150   GFRAA 24 (L) 02/02/2020 1011   GFRAA 72 09/09/2014 1150    INo results found for: SPEP, UPEP  Lab Results  Component Value Date   WBC 3.9 (L) 03/01/2020   NEUTROABS 2.8 03/01/2020   HGB 9.7 (L) 03/01/2020   HCT 30.7 (L) 03/01/2020   MCV 97.5 03/01/2020   PLT 200 03/01/2020      Chemistry      Component Value Date/Time   NA 137 02/02/2020 1011   NA 138 07/21/2019 1019   NA 143 05/21/2017 0959   K 4.9 02/02/2020 1011   K 4.0 05/21/2017 0959   CL 103 02/02/2020 1011   CO2 24 02/02/2020 1011   CO2 26 05/21/2017 0959   BUN 45 (H) 02/02/2020 1011   BUN 40 (H) 07/21/2019 1019   BUN 16.7 05/21/2017 0959   CREATININE 2.16 (H) 02/02/2020 1011   CREATININE 1.1 05/21/2017 0959      Component Value Date/Time   CALCIUM 11.9 (H) 02/02/2020 1011   CALCIUM 10.3 05/21/2017 0959   ALKPHOS 47 07/21/2019 1019   ALKPHOS 42 05/21/2017 0959   AST 28 07/21/2019 1019   AST 18 05/21/2017 0959   ALT 29 07/21/2019 1019   ALT 14 05/21/2017 0959   BILITOT 0.9 07/21/2019 1019   BILITOT 0.61 05/21/2017 0959       No results found for: LABCA2  No components found for: LABCA125  No results for input(s): INR in the last 168 hours.  Urinalysis     Component Value Date/Time   COLORURINE AMBER (A) 09/04/2017 2045   APPEARANCEUR CLOUDY (A) 09/04/2017 2045   LABSPEC 1.009 09/04/2017 2045   PHURINE 6.0 09/04/2017 2045   GLUCOSEU NEGATIVE 09/04/2017 2045   HGBUR LARGE (A) 09/04/2017 2045   BILIRUBINUR negative 05/15/2018 1032   BILIRUBINUR neg 09/14/2014 1308   KETONESUR negative 05/15/2018 1032   KETONESUR NEGATIVE 09/04/2017 2045   PROTEINUR negative 05/15/2018 1032   PROTEINUR 100 (A) 09/04/2017 2045   UROBILINOGEN 0.2 05/15/2018 1032   UROBILINOGEN 0.2 02/01/2011 1158   NITRITE Negative 05/15/2018 1032   NITRITE NEGATIVE 09/04/2017 2045   LEUKOCYTESUR Trace (A) 05/15/2018 1032    STUDIES: LONG TERM MONITOR (3-14 DAYS)  Result Date: 02/28/2020 1.  Atrial Fibrillation occurred continuously (100% burden), ranging from 38-173 bpm (avg of 84 bpm). 2. Two runs nonsustainec Ventricular Tachycardia occurred, the longest lasting 8 beats with an avg rate of 110 bpm. 3. One pause occurred lasting 3 secs (20 bpm). 4. Isolated PVCs were rare 5. Ventricular Bigeminy and Trigeminy were present. Arvilla Meres, MD 11:23 PM   ASSESSMENT: 85 y.o.  [place] woman status post left breast lower outer quadrant biopsy 10/05/2014 for a clinical T1 N0, stage IA invasive ductal breast cancer, grade 1, estrogen and progesterone receptor positive, HER-2 not amplified, with an MIB-1 of 5%   (1) left lumpectomy and sentinel lymph node sampling 10/22/2014 showed a pT1c pN0, stage IA mixed ductal and mucinous invasive carcinoma, grade 2, repeat HER-2 again negative  (a) positive margins were cleared with subsequent  surgery 11/04/2014.  (2) radiation 12/21/2014-01/11/2015: Left Breast / 42.56 Gy in 16 fractions   (3) anastrozole started September 2016, completing 5 years September 2021  (a) bone density 09/27/2014 shows osteoporosis  (b) denosumab/Prolia started 05/19/2015, to be repeated every 6 months.  (c) repeat bone density 09/28/2016 showed a T  score of -3.3; this was stable  (d) bone density October 2021   PLAN: Damyah is now a little over 5 years out from definitive surgery for her breast cancer with no evidence of disease recurrence.  This is very favorable.  She is completing 5 years of anastrozole.  She is stopping that now.  She will have a repeat bone density next week at the time of her next mammogram  She is 2 years behind on her mammography because of the pandemic.  I do not note any change however in her breast exam today  I offered our sympathy for the sudden death of her husband last night  She will receive Prolia today, and then again in 6 and 12 months.  She will see me again in 12 months.  Total encounter time 25 minutes.*   Demarko Zeimet, [name], MD  03/01/20 2:39 PM Medical Oncology and Hematology [facility] [place] [address] [place], [place] [address] Tel. [phone]    Fax. [phone]   I, Mickie Bail, am acting as scribe for Dr. [name]. [facility].  I, Ruthann Cancer MD, have reviewed the above documentation for accuracy and completeness, and I agree with the above.   *Total Encounter Time as defined by the Centers for Medicare and Medicaid Services includes, in addition to the face-to-face time of a patient visit (documented in the note above) non-face-to-face time: obtaining and reviewing outside history, ordering and reviewing medications, tests or procedures, care coordination (communications with other health care professionals or caregivers) and documentation in the medical record.

## 2020-03-02 ENCOUNTER — Telehealth: Payer: Self-pay | Admitting: Oncology

## 2020-03-02 NOTE — Telephone Encounter (Signed)
Scheduled appts per 9/28 los. Left voicemail with appt dates and times.

## 2020-03-12 ENCOUNTER — Other Ambulatory Visit (HOSPITAL_COMMUNITY): Payer: Self-pay | Admitting: Internal Medicine

## 2020-03-18 ENCOUNTER — Ambulatory Visit (HOSPITAL_COMMUNITY)
Admission: RE | Admit: 2020-03-18 | Discharge: 2020-03-18 | Disposition: A | Discharge status: Home / Self Care | Payer: Medicare Other | Source: Ambulatory Visit | Attending: Internal Medicine | Admitting: Internal Medicine

## 2020-03-18 ENCOUNTER — Encounter (HOSPITAL_COMMUNITY): Payer: Self-pay | Admitting: Internal Medicine

## 2020-03-18 ENCOUNTER — Other Ambulatory Visit: Payer: Self-pay

## 2020-03-18 ENCOUNTER — Ambulatory Visit (HOSPITAL_BASED_OUTPATIENT_CLINIC_OR_DEPARTMENT_OTHER)
Admission: RE | Admit: 2020-03-18 | Discharge: 2020-03-18 | Disposition: A | Discharge status: Home / Self Care | Payer: Medicare Other | Source: Ambulatory Visit | Attending: Internal Medicine | Admitting: Internal Medicine

## 2020-03-18 VITALS — BP 100/60 | HR 81 | Ht 68.0 in | Wt 135.2 lb

## 2020-03-18 DIAGNOSIS — E7800 Pure hypercholesterolemia, unspecified: Secondary | ICD-10-CM | POA: Diagnosis not present

## 2020-03-18 DIAGNOSIS — K219 Gastro-esophageal reflux disease without esophagitis: Secondary | ICD-10-CM | POA: Diagnosis not present

## 2020-03-18 DIAGNOSIS — I4891 Unspecified atrial fibrillation: Secondary | ICD-10-CM

## 2020-03-18 DIAGNOSIS — I340 Nonrheumatic mitral (valve) insufficiency: Secondary | ICD-10-CM

## 2020-03-18 DIAGNOSIS — Z7901 Long term (current) use of anticoagulants: Secondary | ICD-10-CM | POA: Diagnosis not present

## 2020-03-18 DIAGNOSIS — N1832 Chronic kidney disease, stage 3b: Secondary | ICD-10-CM

## 2020-03-18 DIAGNOSIS — I130 Hypertensive heart and chronic kidney disease with heart failure and stage 1 through stage 4 chronic kidney disease, or unspecified chronic kidney disease: Secondary | ICD-10-CM | POA: Diagnosis not present

## 2020-03-18 DIAGNOSIS — I4821 Permanent atrial fibrillation: Secondary | ICD-10-CM

## 2020-03-18 DIAGNOSIS — M810 Age-related osteoporosis without current pathological fracture: Secondary | ICD-10-CM | POA: Diagnosis not present

## 2020-03-18 DIAGNOSIS — Z8674 Personal history of sudden cardiac arrest: Secondary | ICD-10-CM | POA: Insufficient documentation

## 2020-03-18 DIAGNOSIS — Z955 Presence of coronary angioplasty implant and graft: Secondary | ICD-10-CM | POA: Insufficient documentation

## 2020-03-18 DIAGNOSIS — Z951 Presence of aortocoronary bypass graft: Secondary | ICD-10-CM | POA: Diagnosis not present

## 2020-03-18 DIAGNOSIS — I5022 Chronic systolic (congestive) heart failure: Secondary | ICD-10-CM

## 2020-03-18 DIAGNOSIS — Z8249 Family history of ischemic heart disease and other diseases of the circulatory system: Secondary | ICD-10-CM | POA: Insufficient documentation

## 2020-03-18 DIAGNOSIS — Z853 Personal history of malignant neoplasm of breast: Secondary | ICD-10-CM | POA: Diagnosis not present

## 2020-03-18 DIAGNOSIS — I252 Old myocardial infarction: Secondary | ICD-10-CM | POA: Diagnosis not present

## 2020-03-18 DIAGNOSIS — I2510 Atherosclerotic heart disease of native coronary artery without angina pectoris: Secondary | ICD-10-CM | POA: Insufficient documentation

## 2020-03-18 DIAGNOSIS — Z79899 Other long term (current) drug therapy: Secondary | ICD-10-CM | POA: Insufficient documentation

## 2020-03-18 DIAGNOSIS — Z923 Personal history of irradiation: Secondary | ICD-10-CM | POA: Diagnosis not present

## 2020-03-18 LAB — ECHOCARDIOGRAM COMPLETE
Calc EF: 44.3 %
MV M vel: 5.06 m/s
MV Peak grad: 102.4 mmHg
MV VTI: 0.38 cm2
P 1/2 time: 360 msec
Radius: 0.89 cm
S' Lateral: 3.4 cm
Single Plane A2C EF: 31.8 %
Single Plane A4C EF: 54.1 %

## 2020-03-18 LAB — COMPREHENSIVE METABOLIC PANEL
ALT: 25 U/L (ref 0–44)
AST: 21 U/L (ref 15–41)
Albumin: 3.5 g/dL (ref 3.5–5.0)
Alkaline Phosphatase: 56 U/L (ref 38–126)
Anion gap: 8 (ref 5–15)
BUN: 42 mg/dL — ABNORMAL HIGH (ref 8–23)
CO2: 19 mmol/L — ABNORMAL LOW (ref 22–32)
Calcium: 9.8 mg/dL (ref 8.9–10.3)
Chloride: 112 mmol/L — ABNORMAL HIGH (ref 98–111)
Creatinine, Ser: 1.62 mg/dL — ABNORMAL HIGH (ref 0.44–1.00)
GFR, Estimated: 29 mL/min — ABNORMAL LOW (ref >60)
Glucose, Bld: 99 mg/dL (ref 70–99)
Potassium: 4.6 mmol/L (ref 3.5–5.1)
Sodium: 139 mmol/L (ref 135–145)
Total Bilirubin: 1 mg/dL (ref 0.3–1.2)
Total Protein: 7 g/dL (ref 6.5–8.1)

## 2020-03-18 LAB — CBC
HCT: 32.2 % — ABNORMAL LOW (ref 36.0–46.0)
Hemoglobin: 10 g/dL — ABNORMAL LOW (ref 12.0–15.0)
MCH: 31.2 pg (ref 26.0–34.0)
MCHC: 31.1 g/dL (ref 30.0–36.0)
MCV: 100.3 fL — ABNORMAL HIGH (ref 80.0–100.0)
Platelets: 193 10*3/uL (ref 150–400)
RBC: 3.21 MIL/uL — ABNORMAL LOW (ref 3.87–5.11)
RDW: 13.4 % (ref 11.5–15.5)
WBC: 4.2 10*3/uL (ref 4.0–10.5)
nRBC: 0 % (ref 0.0–0.2)

## 2020-03-18 LAB — BRAIN NATRIURETIC PEPTIDE: B Natriuretic Peptide: 515 pg/mL — ABNORMAL HIGH (ref 0.0–100.0)

## 2020-03-18 NOTE — Progress Notes (Signed)
      [place] .37%

## 2020-03-18 NOTE — Progress Notes (Signed)
Advanced Heart Failure Clinic Note   Date:  03/18/2020   ID:  [name], DOB 07/29/1934, MRN [id number]  Location: Home  Provider location: [facility] Type of Visit: Established patient  PCP:  [name], MD  Cardiologist:  No primary care provider on file. Primary HF: Azlan Hanway  Chief Complaint: Heart Failure follow-up   History of Present Illness:  [name] is a 85 y.o. female with h/o HTN, breast cancer, CAD s/p anterior STEMI 2/19, PAF and systolic HF.   Admitted to [facility]  ED 07/27/17 with CP.Hospital course was complicated by acute respiratory failure, bradycardic arrest, Afib RVR, and cardiogenic shock in the setting of anterior MI. Found to have acute anterior MI and takenurgently to thecath lab. LHC 07/27/17 as below with 3v CAD, LVEF 35-40%, elevated LVEDP. Underwent PCI with successful stenting of the ramus intermediate artery with DES x 2.. As she improved she was extubated on 2/24. An ECHO was completed an showed LVEF 40-45%.   On 06/13/18 she underwent cardioversion for recurrent A fib. She returned to the A fib clinic on 06/19/18 and was back in A Fib.   At last visit we placed a zio patch to assess AF rate. Zio placed continue AF with average rate 84. (was not on carvedilol at that time)   She presents today for routine f/u with her son. Unfortunately lost her husband since we last saw her. At last visit we started carvedilol 3.12 bid. Has now stopped carvedilol and feels a lot better. No edema, orthopnea or PND.   Echo 9/20 EF 40-45% 3+ MR Personally reviewed  ECHO 12/2017 45-50%  ECHO 07/27/2017 EF 40-45% RVSP 50 mm hg   LHC 07/27/2017  Post Atrio lesion is 90% stenosed.  Prox LAD to Dist LAD lesion is 50% stenosed.  Ost 1st Diag lesion is 95% stenosed.  Ost 2nd Diag to 2nd Diag lesion is 90% stenosed.  Mid Cx lesion is 30% stenosed.  Ost Ramus to Ramus lesion is 100% stenosed.  A drug-eluting stent  was successfully placed using a STENT SYNERGY DES 2.75X28.  A drug-eluting stent was successfully placed using a STENT SYNERGY DES 2.5X12.  Post intervention, there is a 0% residual stenosis.  There is moderate to severe left ventricular systolic dysfunction.  LV end diastolic pressure is moderately elevated.  The left ventricular ejection fraction is 35-45% by visual estimate. 1. 3 vessel obstructive CAD - 100% ramus intermediate artery. This is a large vessel and is the culprit lesion. - 95% small first diagonal - 90% small second diagonal  - diffusely diseased LAD - 90% PLOM    Past Medical History:  Diagnosis Date  . Anxiety   . Breast cancer of lower-outer quadrant of left female breast (HCC) 10/11/2014   treated with lumpectomy and radiation  . Coronary artery disease 07/27/2017  . GERD (gastroesophageal reflux disease)   . Hypertension   . Myocardial infarction (HCC) 07/27/2017   STEMI of anterolateral wall. Cath w/ stent and cabg by Dr. Swaziland  . Osteoporosis 10/20/2014  . Personal history of radiation therapy 2016   treatment of breast cancer  . PONV (postoperative nausea and vomiting)   . PUD (peptic ulcer disease)   . Pure hypercholesterolemia 04/08/2014  . Stone, kidney    Past Surgical History:  Procedure Laterality Date  . BREAST LUMPECTOMY Left 2016  . BREAST LUMPECTOMY WITH SENTINEL LYMPH NODE BIOPSY Left 10/22/2014   Procedure: BREAST LUMPECTOMY  WITH SENTINEL LYMPH NODE BIOPSY;  Surgeon: Chevis Pretty III, MD;  Location: [facility];  Service: General;  Laterality: Left;  . CARDIOVERSION N/A 06/13/2018   Procedure: CARDIOVERSION;  Surgeon: Dolores Patty, MD;  Location: [facility] ENDOSCOPY;  Service: Cardiovascular;  Laterality: N/A;  . CORONARY STENT INTERVENTION N/A 07/27/2017   Procedure: CORONARY STENT INTERVENTION;  Surgeon: Swaziland, Peter M, MD;  Location: [facility] INVASIVE CV LAB;  Service: Cardiovascular;  Laterality: N/A;  .  CORONARY/GRAFT ACUTE MI REVASCULARIZATION N/A 07/27/2017   Procedure: Coronary/Graft Acute MI Revascularization;  Surgeon: Swaziland, Peter M, MD;  Location: [facility] INVASIVE CV LAB;  Service: Cardiovascular;  Laterality: N/A;  . LEFT HEART CATH AND CORONARY ANGIOGRAPHY N/A 07/27/2017   Procedure: LEFT HEART CATH AND CORONARY ANGIOGRAPHY;  Surgeon: Swaziland, Peter M, MD;  Location: [facility] INVASIVE CV LAB;  Service: Cardiovascular;  Laterality: N/A;  . RE-EXCISION OF BREAST CANCER,SUPERIOR MARGINS Left 11/04/2014   Procedure: RE-EXCISION OF LEFT BREAST INFERIOR MARGINS;  Surgeon: Chevis Pretty III, MD;  Location: [facility] OR;  Service: General;  Laterality: Left;     Current Outpatient Medications  Medication Sig Dispense Refill  . apixaban (ELIQUIS) 2.5 MG TABS tablet Take 1 tablet (2.5 mg total) by mouth 2 (two) times daily. 180 tablet 3  . atorvastatin (LIPITOR) 80 MG tablet TAKE 1 TABLET BY MOUTH ONCE DAILY AT  6PM 90 tablet 0  . Cholecalciferol (VITAMIN D-3) 125 MCG (5000 UT) TABS Take 5,000 Units by mouth every morning. 90 tablet 4  . co-enzyme Q-10 30 MG capsule Take 30 mg by mouth 3 (three) times daily.    Marland Kitchen Fe Bisgly-Vit C-Vit B12-FA (GENTLE IRON PO) Take 1-2 tablets by mouth daily.    . fish oil-omega-3 fatty acids 1000 MG capsule Take 1,200 mg by mouth at bedtime.     . furosemide (LASIX) 20 MG tablet TAKE ONE TABLET BY MOUTH EVERY OTHER DAY ALTERNATING WITH 2 TABLETS EVERY OTHER DAY 135 tablet 0  . losartan (COZAAR) 25 MG tablet Take 1 tablet (25 mg total) by mouth 2 (two) times daily. Must keep pending appt for further refills 180 tablet 0  . Multiple Vitamin (MULTIVITAMIN WITH MINERALS) TABS tablet Take 1 tablet by mouth daily.    Marland Kitchen spironolactone (ALDACTONE) 25 MG tablet Take 1 tablet by mouth once daily 90 tablet 0  . Calcium Carb-Cholecalciferol (CALCIUM + D3) 600-800 MG-UNIT TABS Take 1 tablet by mouth every evening.  (Patient not taking: Reported on 03/18/2020)    . carvedilol (COREG) 3.125 MG tablet  Take 1 tablet (3.125 mg total) by mouth 2 (two) times daily with a meal. (Patient not taking: Reported on 03/18/2020) 60 tablet 3  . Magnesium Oxide (MAG-CAPS PO) Take by mouth. QD (Patient not taking: Reported on 03/18/2020)    . nitroGLYCERIN (NITROSTAT) 0.3 MG SL tablet DISSOLVE ONE TABLET UNDER THE TONGUE EVERY 5 MINUTES AS NEEDED FOR CHEST PAIN.  DO NOT EXCEED A TOTAL OF 3 DOSES IN 15 MINUTES NOW (Patient not taking: Reported on 03/18/2020) 100 tablet 0   No current facility-administered medications for this encounter.    Allergies:   Lisinopril   Social History:  The patient  reports that she has never smoked. She has never used smokeless tobacco. She reports that she does not drink alcohol and does not use drugs.   Family History:  The patient's family history includes Heart disease in her brother; Hypertension in her brother, brother, sister, and sister; Pancreatic cancer in her mother; Stroke in  her father.   ROS:  Please see the history of present illness.   All other systems are personally reviewed and negative.   Vitals:   03/18/20 1017  BP: 100/60  Pulse: 81  SpO2: 100%  Weight: 61.3 kg (135 lb 3.2 oz)  Height: 5\' 8"  (1.727 m)    Exam:   General:  Well appearing. No resp difficulty HEENT: normal Neck: supple. no JVD. Carotids 2+ bilat; no bruits. No lymphadenopathy or thryomegaly appreciated. Cor: PMI nondisplaced. Irregular tachy 3/6 MR Lungs: clear Abdomen: soft, nontender, nondistended. No hepatosplenomegaly. No bruits or masses. Good bowel sounds. Extremities: no cyanosis, clubbing, rash, edema Neuro: alert & orientedx3, cranial nerves grossly intact. moves all 4 extremities w/o difficulty. Affect pleasant  ECG AF with RVR 103 Personally reviewed   Recent Labs: 02/02/2020: B Natriuretic Peptide 568.6 03/01/2020: ALT 40; BUN 48; Creatinine 2.52; Hemoglobin 9.7; Platelets 200; Potassium 4.1; Sodium 139  Personally reviewed   Wt Readings from Last 3 Encounters:   03/18/20 61.3 kg (135 lb 3.2 oz)  03/01/20 62.9 kg (138 lb 11.2 oz)  02/02/20 62.7 kg (138 lb 4 oz)      ASSESSMENT AND PLAN:  1. CAD - 3V CAD with STEMI 07/2017 S/P DES x2 to Ramus - No s/s of ischemai - Continue eliquis, and high dose statin. Off ASA. - Intolerant of carvedilol 3.125 bid   2. Chronic Systolic Heart Failure, ICM- ECHO EF 07/2017 EF 40-45%   Echo 12/2017 EF 45-50%  - Echo 9/20 EF ~40-45% 3+ MR - Echo today 03/18/20 EF 40-45% severe MR - Volume status stable. Takes lasix 20mg  3-4x per week.  - Stable NYHA II-III - Continue losartan 25 mg twice a day. - Continue 25 mg spironolactone daily  - Failed carvedilol again due to fatigue - Recheck labs today. May need to stop losartan and spiro if creatinine continues to climb - Reinforced fluid restriction to < 2 L daily, sodium restriction to less than 2000 mg daily, and the importance of daily weights.   3. Permanent AF -S/P DC-CV 06/13/2018 to NSR but back in A fib on 1/16  - Remains in A fib. She was intolerant amio due to nausea and tremors.  - Dr Johney Frame was planning PPM insertion, then uptitration of BB. She has deferred this procedure for now.  - Rate fast here but Zio 9/21 shows avg rate 84bpm. Intolerant of carvedilol 3.125 (failed 2x) - Previously followed in AF clinic.  - Continue eliquis 2.5 mg BID. No bleeding. Check labs today  4. HTN - BP ok   5. H.O Left Breast Cancer - No change to current plan.    6. CKD IIIb - Creatinine 1.5-1.8 at baseline - Recheck 03/01/20 2.5 (GFR 20)  - Recheck labs today. May need to stop losartan and spiro if creatinine continues to climb - Follows with nephrology - May be candidate for SGLT2i  7. Mitral regurgitation - ischemic in nature. 3+ MR on echo - plan as above. Check TEE and RHC - I have reached out to Structural Team to review  Total time spent 35 minutes. Over half that time spent discussing above.      Signed, Arvilla Meres, MD   03/18/2020 11:07 AM  [facility] Failure [facility] [address] Heart and Vascular [place] [address] [phone] (office) [phone] (fax)

## 2020-03-18 NOTE — Progress Notes (Signed)
  Echocardiogram 2D Echocardiogram has been performed.  [name] 03/18/2020, 10:16 AM

## 2020-03-18 NOTE — Patient Instructions (Addendum)
It was great to see you today! No medication changes are needed at this time.  Your physician recommends that you schedule a follow-up appointment in: 4 months with Dr Jeoffrey Massed KAMANI MAGNUSSEN  03/18/2020  You are scheduled for a Cardiac Catheterization on Tuesday, November 2 with Dr. Glori Bickers.  1. Please arrive at the [facility] (Main Entrance A) at [facility]: [address] [place], [place] [address] at 8:30 AM (This time is two hours before your procedure to ensure your preparation). Free valet parking service is available.   Special note: Every effort is made to have your procedure done on time. Please understand that emergencies sometimes delay scheduled procedures.  2. Diet: Do not eat solid foods after midnight.  The patient may have clear liquids until 5am upon the day of the procedure.  3. Labs: PRE PROCEDURE LABS DONE 03/18/2020  You will need a pre procedure COVID test    WHEN: 04/01/20 anytime between 9am-3pm WHERE: COVID Test Site [address] [place], [place] [address]  This is a drive thru testing site, you will remain in your car. Be sure to get in the line FOR PROCEDURES Once you have been swabbed you will need to remain home in quarantine until you return for your procedure.   4. Medication instructions in preparation for your procedure:   Contrast Allergy: No  Hold Eliquis the night before the procedure 04/04/20 and the morning of procedure 04/05/20  Stop taking, Lasix (Furosemide)  Tuesday, November 2,   On the morning of your procedure, take your Aspirin and any morning medicines NOT listed above.  You may use sips of water.  5. Plan for one night stay--bring personal belongings. 6. Bring a current list of your medications and current insurance cards. 7. You MUST have a responsible person to drive you home. 8. Someone MUST be with you the first 24 hours after you arrive home or your discharge will be delayed. 9. Please wear  clothes that are easy to get on and off and wear slip-on shoes.  Thank you for allowing [place] to care for you!   -- [facility]

## 2020-03-18 NOTE — H&P (View-Only) (Signed)
Advanced Heart Failure Clinic Note   Date:  03/18/2020   ID:  TATEUM [name], DOB 07/29/1934, MRN [id number]  Location: Home  Provider location: [facility] Type of Visit: Established patient  PCP:  Mary Bosworth, MD  Cardiologist:  No primary care provider on file. Primary HF: [name]  Chief Complaint: Heart Failure follow-up   History of Present Illness:  [name] is a 85 y.o. female with h/o HTN, breast cancer, CAD s/p anterior STEMI 2/19, PAF and systolic HF.   Admitted to [facility]  ED 07/27/17 with CP.Hospital course was complicated by acute respiratory failure, bradycardic arrest, Afib RVR, and cardiogenic shock in the setting of anterior MI. Found to have acute anterior MI and takenurgently to thecath lab. LHC 07/27/17 as below with 3v CAD, LVEF 35-40%, elevated LVEDP. Underwent PCI with successful stenting of the ramus intermediate artery with DES x 2.. As she improved she was extubated on 2/24. An ECHO was completed an showed LVEF 40-45%.   On 06/13/18 she underwent cardioversion for recurrent A fib. She returned to the A fib clinic on 06/19/18 and was back in A Fib.   At last visit we placed a zio patch to assess AF rate. Zio placed continue AF with average rate 84. (was not on carvedilol at that time)   She presents today for routine f/u with her son. Unfortunately lost her husband since we last saw her. At last visit we started carvedilol 3.12 bid. Has now stopped carvedilol and feels a lot better. No edema, orthopnea or PND.   Echo 9/20 EF 40-45% 3+ MR Personally reviewed  ECHO 12/2017 45-50%  ECHO 07/27/2017 EF 40-45% RVSP 50 mm hg   LHC 07/27/2017  Post Atrio lesion is 90% stenosed.  Prox LAD to Dist LAD lesion is 50% stenosed.  Ost 1st Diag lesion is 95% stenosed.  Ost 2nd Diag to 2nd Diag lesion is 90% stenosed.  Mid Cx lesion is 30% stenosed.  Ost Ramus to Ramus lesion is 100% stenosed.  A drug-eluting stent  was successfully placed using a STENT SYNERGY DES 2.75X28.  A drug-eluting stent was successfully placed using a STENT SYNERGY DES 2.5X12.  Post intervention, there is a 0% residual stenosis.  There is moderate to severe left ventricular systolic dysfunction.  LV end diastolic pressure is moderately elevated.  The left ventricular ejection fraction is 35-45% by visual estimate. 1. 3 vessel obstructive CAD - 100% ramus intermediate artery. This is a large vessel and is the culprit lesion. - 95% small first diagonal - 90% small second diagonal  - diffusely diseased LAD - 90% PLOM    Past Medical History:  Diagnosis Date  . Anxiety   . Breast cancer of lower-outer quadrant of left female breast (HCC) 10/11/2014   treated with lumpectomy and radiation  . Coronary artery disease 07/27/2017  . GERD (gastroesophageal reflux disease)   . Hypertension   . Myocardial infarction (HCC) 07/27/2017   STEMI of anterolateral wall. Cath w/ stent and cabg by Dr. Swaziland  . Osteoporosis 10/20/2014  . Personal history of radiation therapy 2016   treatment of breast cancer  . PONV (postoperative nausea and vomiting)   . PUD (peptic ulcer disease)   . Pure hypercholesterolemia 04/08/2014  . Stone, kidney    Past Surgical History:  Procedure Laterality Date  . BREAST LUMPECTOMY Left 2016  . BREAST LUMPECTOMY WITH SENTINEL LYMPH NODE BIOPSY Left 10/22/2014   Procedure: BREAST LUMPECTOMY  WITH SENTINEL LYMPH NODE BIOPSY;  Surgeon: Mary Pretty III, MD;  Location: [facility];  Service: General;  Laterality: Left;  . CARDIOVERSION N/A 06/13/2018   Procedure: CARDIOVERSION;  Surgeon: Mary Patty, MD;  Location: [facility] ENDOSCOPY;  Service: Cardiovascular;  Laterality: N/A;  . CORONARY STENT INTERVENTION N/A 07/27/2017   Procedure: CORONARY STENT INTERVENTION;  Surgeon: Mary, Peter M, MD;  Location: [facility] INVASIVE CV LAB;  Service: Cardiovascular;  Laterality: N/A;  .  CORONARY/GRAFT ACUTE MI REVASCULARIZATION N/A 07/27/2017   Procedure: Coronary/Graft Acute MI Revascularization;  Surgeon: Mary, Peter M, MD;  Location: [facility] INVASIVE CV LAB;  Service: Cardiovascular;  Laterality: N/A;  . LEFT HEART CATH AND CORONARY ANGIOGRAPHY N/A 07/27/2017   Procedure: LEFT HEART CATH AND CORONARY ANGIOGRAPHY;  Surgeon: Mary, Peter M, MD;  Location: [facility] INVASIVE CV LAB;  Service: Cardiovascular;  Laterality: N/A;  . RE-EXCISION OF BREAST CANCER,SUPERIOR MARGINS Left 11/04/2014   Procedure: RE-EXCISION OF LEFT BREAST INFERIOR MARGINS;  Surgeon: Mary Pretty III, MD;  Location: [facility] OR;  Service: General;  Laterality: Left;     Current Outpatient Medications  Medication Sig Dispense Refill  . apixaban (ELIQUIS) 2.5 MG TABS tablet Take 1 tablet (2.5 mg total) by mouth 2 (two) times daily. 180 tablet 3  . atorvastatin (LIPITOR) 80 MG tablet TAKE 1 TABLET BY MOUTH ONCE DAILY AT  6PM 90 tablet 0  . Cholecalciferol (VITAMIN D-3) 125 MCG (5000 UT) TABS Take 5,000 Units by mouth every morning. 90 tablet 4  . co-enzyme Q-10 30 MG capsule Take 30 mg by mouth 3 (three) times daily.    Marland Kitchen Fe Bisgly-Vit C-Vit B12-FA (GENTLE IRON PO) Take 1-2 tablets by mouth daily.    . fish oil-omega-3 fatty acids 1000 MG capsule Take 1,200 mg by mouth at bedtime.     . furosemide (LASIX) 20 MG tablet TAKE ONE TABLET BY MOUTH EVERY OTHER DAY ALTERNATING WITH 2 TABLETS EVERY OTHER DAY 135 tablet 0  . losartan (COZAAR) 25 MG tablet Take 1 tablet (25 mg total) by mouth 2 (two) times daily. Must keep pending appt for further refills 180 tablet 0  . Multiple Vitamin (MULTIVITAMIN WITH MINERALS) TABS tablet Take 1 tablet by mouth daily.    Marland Kitchen spironolactone (ALDACTONE) 25 MG tablet Take 1 tablet by mouth once daily 90 tablet 0  . Calcium Carb-Cholecalciferol (CALCIUM + D3) 600-800 MG-UNIT TABS Take 1 tablet by mouth every evening.  (Patient not taking: Reported on 03/18/2020)    . carvedilol (COREG) 3.125 MG tablet  Take 1 tablet (3.125 mg total) by mouth 2 (two) times daily with a meal. (Patient not taking: Reported on 03/18/2020) 60 tablet 3  . Magnesium Oxide (MAG-CAPS PO) Take by mouth. QD (Patient not taking: Reported on 03/18/2020)    . nitroGLYCERIN (NITROSTAT) 0.3 MG SL tablet DISSOLVE ONE TABLET UNDER THE TONGUE EVERY 5 MINUTES AS NEEDED FOR CHEST PAIN.  DO NOT EXCEED A TOTAL OF 3 DOSES IN 15 MINUTES NOW (Patient not taking: Reported on 03/18/2020) 100 tablet 0   No current facility-administered medications for this encounter.    Allergies:   Lisinopril   Social History:  The patient  reports that she has never smoked. She has never used smokeless tobacco. She reports that she does not drink alcohol and does not use drugs.   Family History:  The patient's family history includes Heart disease in her brother; Hypertension in her brother, brother, sister, and sister; Pancreatic cancer in her mother; Stroke in  her father.   ROS:  Please see the history of present illness.   All other systems are personally reviewed and negative.   Vitals:   03/18/20 1017  BP: 100/60  Pulse: 81  SpO2: 100%  Weight: 61.3 kg (135 lb 3.2 oz)  Height: 5\' 8"  (1.727 [name])    Exam:   General:  Well appearing. No resp difficulty HEENT: normal Neck: supple. no JVD. Carotids 2+ bilat; no bruits. No lymphadenopathy or thryomegaly appreciated. Cor: PMI nondisplaced. Irregular tachy 3/6 MR Lungs: clear Abdomen: soft, nontender, nondistended. No hepatosplenomegaly. No bruits or masses. Good bowel sounds. Extremities: no cyanosis, clubbing, rash, edema Neuro: alert & orientedx3, cranial nerves grossly intact. moves all 4 extremities w/o difficulty. Affect pleasant  ECG AF with RVR 103 Personally reviewed   Recent Labs: 02/02/2020: B Natriuretic Peptide 568.6 03/01/2020: ALT 40; BUN 48; Creatinine 2.52; Hemoglobin 9.7; Platelets 200; Potassium 4.1; Sodium 139  Personally reviewed   Wt Readings from Last 3 Encounters:   03/18/20 61.3 kg (135 lb 3.2 oz)  03/01/20 62.9 kg (138 lb 11.2 oz)  02/02/20 62.7 kg (138 lb 4 oz)      ASSESSMENT AND PLAN:  1. CAD - 3V CAD with STEMI 07/2017 S/P DES x2 to Ramus - No s/s of ischemai - Continue eliquis, and high dose statin. Off ASA. - Intolerant of carvedilol 3.125 bid   2. Chronic Systolic Heart Failure, ICM- ECHO EF 07/2017 EF 40-45%   Echo 12/2017 EF 45-50%  - Echo 9/20 EF ~40-45% 3+ MR - Echo today 03/18/20 EF 40-45% severe MR - Volume status stable. Takes lasix 20mg  3-4x per week.  - Stable NYHA II-[name] - Continue losartan 25 mg twice a day. - Continue 25 mg spironolactone daily  - Failed carvedilol again due to fatigue - Recheck labs today. May need to stop losartan and spiro if creatinine continues to climb - Reinforced fluid restriction to < 2 L daily, sodium restriction to less than 2000 mg daily, and the importance of daily weights.   3. Permanent AF -S/P DC-CV 06/13/2018 to NSR but back in A fib on 1/16  - Remains in A fib. She was intolerant amio due to nausea and tremors.  - Dr Johney Frame was planning PPM insertion, then uptitration of BB. She has deferred this procedure for now.  - Rate fast here but Zio 9/21 shows avg rate 84bpm. Intolerant of carvedilol 3.125 (failed 2x) - Previously followed in AF clinic.  - Continue eliquis 2.5 mg BID. No bleeding. Check labs today  4. HTN - BP ok   5. H.O Left Breast Cancer - No change to current plan.    6. CKD IIIb - Creatinine 1.5-1.8 at baseline - Recheck 03/01/20 2.5 (GFR 20)  - Recheck labs today. May need to stop losartan and spiro if creatinine continues to climb - Follows with nephrology - May be candidate for SGLT2i  7. Mitral regurgitation - ischemic in nature. 3+ MR on echo - plan as above. Check TEE and RHC - I have reached out to Structural Team to review  Total time spent 35 minutes. Over half that time spent discussing above.      Signed, Arvilla Meres, MD   03/18/2020 11:07 AM  [facility] Failure [facility] [address] [facility] [place] [address] [phone] (office) [phone] (fax)

## 2020-03-22 ENCOUNTER — Other Ambulatory Visit (HOSPITAL_COMMUNITY): Payer: Self-pay

## 2020-03-22 DIAGNOSIS — I340 Nonrheumatic mitral (valve) insufficiency: Secondary | ICD-10-CM

## 2020-03-29 ENCOUNTER — Telehealth (HOSPITAL_COMMUNITY): Payer: Self-pay | Admitting: *Deleted

## 2020-03-29 NOTE — Telephone Encounter (Signed)
Pt said carvedilol makes her tired and a little short of breath. Pt did not take it yesterday and felt better decided to take it this morning and feels tired. Pt advised to hold evening dose until she hears back from provider.   Routed to [place] for advice

## 2020-03-30 NOTE — Telephone Encounter (Signed)
Ok to hold for now

## 2020-03-31 ENCOUNTER — Encounter (HOSPITAL_COMMUNITY): Payer: Self-pay | Admitting: *Deleted

## 2020-03-31 ENCOUNTER — Other Ambulatory Visit (HOSPITAL_COMMUNITY): Payer: Self-pay | Admitting: *Deleted

## 2020-03-31 NOTE — Telephone Encounter (Signed)
Attempted to reach pt by phone no answer. Sent pt mychart message and updated pt medication list.

## 2020-04-01 ENCOUNTER — Other Ambulatory Visit (HOSPITAL_COMMUNITY)
Admission: RE | Admit: 2020-04-01 | Discharge: 2020-04-01 | Disposition: A | Discharge status: Home / Self Care | Payer: Medicare Other | Source: Ambulatory Visit | Attending: Internal Medicine | Admitting: Internal Medicine

## 2020-04-01 DIAGNOSIS — Z20822 Contact with and (suspected) exposure to covid-19: Secondary | ICD-10-CM | POA: Insufficient documentation

## 2020-04-01 DIAGNOSIS — Z01812 Encounter for preprocedural laboratory examination: Secondary | ICD-10-CM | POA: Insufficient documentation

## 2020-04-01 LAB — SARS CORONAVIRUS 2 (TAT 6-24 HRS): SARS Coronavirus 2: NEGATIVE

## 2020-04-05 ENCOUNTER — Encounter (HOSPITAL_COMMUNITY)
Admission: RE | Disposition: A | Discharge status: Home / Self Care | Payer: Self-pay | Source: Home / Self Care | Attending: Internal Medicine

## 2020-04-05 ENCOUNTER — Ambulatory Visit (HOSPITAL_BASED_OUTPATIENT_CLINIC_OR_DEPARTMENT_OTHER): Payer: Medicare Other

## 2020-04-05 ENCOUNTER — Ambulatory Visit (HOSPITAL_COMMUNITY)
Admission: RE | Admit: 2020-04-05 | Discharge: 2020-04-05 | Disposition: A | Discharge status: Home / Self Care | Payer: Medicare Other | Attending: Internal Medicine | Admitting: Internal Medicine

## 2020-04-05 ENCOUNTER — Encounter (HOSPITAL_COMMUNITY): Payer: Self-pay | Admitting: Internal Medicine

## 2020-04-05 DIAGNOSIS — I2510 Atherosclerotic heart disease of native coronary artery without angina pectoris: Secondary | ICD-10-CM | POA: Diagnosis not present

## 2020-04-05 DIAGNOSIS — I130 Hypertensive heart and chronic kidney disease with heart failure and stage 1 through stage 4 chronic kidney disease, or unspecified chronic kidney disease: Secondary | ICD-10-CM | POA: Diagnosis not present

## 2020-04-05 DIAGNOSIS — I4821 Permanent atrial fibrillation: Secondary | ICD-10-CM | POA: Insufficient documentation

## 2020-04-05 DIAGNOSIS — Z888 Allergy status to other drugs, medicaments and biological substances status: Secondary | ICD-10-CM | POA: Diagnosis not present

## 2020-04-05 DIAGNOSIS — I5022 Chronic systolic (congestive) heart failure: Secondary | ICD-10-CM | POA: Diagnosis not present

## 2020-04-05 DIAGNOSIS — N1832 Chronic kidney disease, stage 3b: Secondary | ICD-10-CM | POA: Diagnosis not present

## 2020-04-05 DIAGNOSIS — Z955 Presence of coronary angioplasty implant and graft: Secondary | ICD-10-CM | POA: Diagnosis not present

## 2020-04-05 DIAGNOSIS — Z7901 Long term (current) use of anticoagulants: Secondary | ICD-10-CM | POA: Diagnosis not present

## 2020-04-05 DIAGNOSIS — I340 Nonrheumatic mitral (valve) insufficiency: Secondary | ICD-10-CM

## 2020-04-05 DIAGNOSIS — I252 Old myocardial infarction: Secondary | ICD-10-CM | POA: Diagnosis not present

## 2020-04-05 DIAGNOSIS — I361 Nonrheumatic tricuspid (valve) insufficiency: Secondary | ICD-10-CM | POA: Diagnosis not present

## 2020-04-05 DIAGNOSIS — C50912 Malignant neoplasm of unspecified site of left female breast: Secondary | ICD-10-CM | POA: Diagnosis not present

## 2020-04-05 HISTORY — PX: RIGHT HEART CATH: CATH118263

## 2020-04-05 HISTORY — PX: TEE WITHOUT CARDIOVERSION: SHX5443

## 2020-04-05 LAB — POCT I-STAT EG7
Acid-base deficit: 6 mmol/L — ABNORMAL HIGH (ref 0.0–2.0)
Acid-base deficit: 7 mmol/L — ABNORMAL HIGH (ref 0.0–2.0)
Bicarbonate: 19.4 mmol/L — ABNORMAL LOW (ref 20.0–28.0)
Bicarbonate: 20 mmol/L (ref 20.0–28.0)
Calcium, Ion: 1.39 mmol/L (ref 1.15–1.40)
Calcium, Ion: 1.41 mmol/L — ABNORMAL HIGH (ref 1.15–1.40)
HCT: 29 % — ABNORMAL LOW (ref 36.0–46.0)
HCT: 29 % — ABNORMAL LOW (ref 36.0–46.0)
Hemoglobin: 9.9 g/dL — ABNORMAL LOW (ref 12.0–15.0)
Hemoglobin: 9.9 g/dL — ABNORMAL LOW (ref 12.0–15.0)
O2 Saturation: 65 %
O2 Saturation: 68 %
Potassium: 4.8 mmol/L (ref 3.5–5.1)
Potassium: 4.8 mmol/L (ref 3.5–5.1)
Sodium: 140 mmol/L (ref 135–145)
Sodium: 141 mmol/L (ref 135–145)
TCO2: 21 mmol/L — ABNORMAL LOW (ref 22–32)
TCO2: 21 mmol/L — ABNORMAL LOW (ref 22–32)
pCO2, Ven: 39.7 mmHg — ABNORMAL LOW (ref 44.0–60.0)
pCO2, Ven: 40.5 mmHg — ABNORMAL LOW (ref 44.0–60.0)
pH, Ven: 7.297 (ref 7.250–7.430)
pH, Ven: 7.301 (ref 7.250–7.430)
pO2, Ven: 37 mmHg (ref 32.0–45.0)
pO2, Ven: 39 mmHg (ref 32.0–45.0)

## 2020-04-05 SURGERY — RIGHT HEART CATH
Anesthesia: LOCAL

## 2020-04-05 SURGERY — ECHOCARDIOGRAM, TRANSESOPHAGEAL
Anesthesia: Moderate Sedation

## 2020-04-05 MED ORDER — LIDOCAINE VISCOUS HCL 2 % MT SOLN
OROMUCOSAL | Status: DC | PRN
  Administered 2020-04-05: 1 via OROMUCOSAL

## 2020-04-05 MED ORDER — SODIUM CHLORIDE 0.9 % IV SOLN: INTRAVENOUS | Status: DC

## 2020-04-05 MED ORDER — FENTANYL CITRATE (PF) 100 MCG/2ML IJ SOLN
INTRAMUSCULAR | Status: AC
  Filled 2020-04-05: qty 4

## 2020-04-05 MED ORDER — LIDOCAINE VISCOUS HCL 2 % MT SOLN
OROMUCOSAL | Status: AC
  Filled 2020-04-05: qty 15

## 2020-04-05 MED ORDER — FENTANYL CITRATE (PF) 100 MCG/2ML IJ SOLN
INTRAMUSCULAR | Status: DC | PRN
  Administered 2020-04-05: 50 ug via INTRAVENOUS

## 2020-04-05 MED ORDER — SODIUM CHLORIDE 0.9% FLUSH: 3.0000 mL | Freq: Two times a day (BID) | INTRAVENOUS | Status: DC

## 2020-04-05 MED ORDER — HYDRALAZINE HCL 20 MG/ML IJ SOLN: 10.0000 mg | INTRAMUSCULAR | Status: DC | PRN

## 2020-04-05 MED ORDER — HEPARIN (PORCINE) IN NACL 1000-0.9 UT/500ML-% IV SOLN
INTRAVENOUS | Status: AC
  Filled 2020-04-05: qty 1000

## 2020-04-05 MED ORDER — HEPARIN (PORCINE) IN NACL 1000-0.9 UT/500ML-% IV SOLN
INTRAVENOUS | Status: DC | PRN
  Administered 2020-04-05: 500 mL

## 2020-04-05 MED ORDER — ASPIRIN 81 MG PO CHEW
81.0000 mg | CHEWABLE_TABLET | ORAL | Status: AC
  Administered 2020-04-05: 81 mg via ORAL

## 2020-04-05 MED ORDER — ASPIRIN 81 MG PO CHEW
CHEWABLE_TABLET | ORAL | Status: AC
  Filled 2020-04-05: qty 1

## 2020-04-05 MED ORDER — LABETALOL HCL 5 MG/ML IV SOLN: 10.0000 mg | INTRAVENOUS | Status: DC | PRN

## 2020-04-05 MED ORDER — LIDOCAINE HCL (PF) 1 % IJ SOLN
INTRAMUSCULAR | Status: AC
  Filled 2020-04-05: qty 30

## 2020-04-05 MED ORDER — SODIUM CHLORIDE 0.9 % IV SOLN: 250.0000 mL | INTRAVENOUS | Status: DC | PRN

## 2020-04-05 MED ORDER — SODIUM CHLORIDE 0.9 % IV SOLN
250.0000 mL | INTRAVENOUS | Status: DC | PRN
  Administered 2020-04-05: 250 mL via INTRAVENOUS

## 2020-04-05 MED ORDER — ONDANSETRON HCL 4 MG/2ML IJ SOLN: 4.0000 mg | Freq: Four times a day (QID) | INTRAMUSCULAR | Status: DC | PRN

## 2020-04-05 MED ORDER — ACETAMINOPHEN 325 MG PO TABS: 650.0000 mg | ORAL_TABLET | ORAL | Status: DC | PRN

## 2020-04-05 MED ORDER — LIDOCAINE HCL (PF) 1 % IJ SOLN
INTRAMUSCULAR | Status: DC | PRN
  Administered 2020-04-05: 2 mL

## 2020-04-05 MED ORDER — DIPHENHYDRAMINE HCL 50 MG/ML IJ SOLN
INTRAMUSCULAR | Status: AC
  Filled 2020-04-05: qty 1

## 2020-04-05 MED ORDER — MIDAZOLAM HCL (PF) 5 MG/ML IJ SOLN
INTRAMUSCULAR | Status: AC
  Filled 2020-04-05: qty 2

## 2020-04-05 MED ORDER — SODIUM CHLORIDE 0.9% FLUSH: 3.0000 mL | INTRAVENOUS | Status: DC | PRN

## 2020-04-05 MED ORDER — MIDAZOLAM HCL (PF) 10 MG/2ML IJ SOLN
INTRAMUSCULAR | Status: DC | PRN
  Administered 2020-04-05: 2 mg via INTRAVENOUS
  Administered 2020-04-05: 1 mg via INTRAVENOUS

## 2020-04-05 SURGICAL SUPPLY — 9 items
CATH SWAN GANZ 7F STRAIGHT (CATHETERS) ×1 IMPLANT
GLIDESHEATH SLENDER 7FR .021G (SHEATH) ×1 IMPLANT
GUIDEWIRE .025 260CM (WIRE) ×1 IMPLANT
PACK CARDIAC CATHETERIZATION (CUSTOM PROCEDURE TRAY) ×2 IMPLANT
PROTECTION STATION PRESSURIZED (MISCELLANEOUS) ×2
STATION PROTECTION PRESSURIZED (MISCELLANEOUS) IMPLANT
TRANSDUCER W/STOPCOCK (MISCELLANEOUS) ×2 IMPLANT
TUBING ART PRESS 72  MALE/FEM (TUBING) ×2
TUBING ART PRESS 72 MALE/FEM (TUBING) IMPLANT

## 2020-04-05 NOTE — Interval H&P Note (Signed)
History and Physical Interval Note:  04/05/2020 10:20 AM  [name]  has presented today for surgery, with the diagnosis of mitral valve regurgitation.  The various methods of treatment have been discussed with the patient and family. After consideration of risks, benefits and other options for treatment, the patient has consented to  Procedure(s): RIGHT HEART CATH (N/A) as a surgical intervention.  The patient's history has been reviewed, patient examined, no change in status, stable for surgery.  I have reviewed the patient's chart and labs.  Questions were answered to the patient's satisfaction.     Carzell Saldivar

## 2020-04-05 NOTE — CV Procedure (Addendum)
TRANSESOPHAGEAL ECHOCARDIOGRAM   NAME:  [name]   MRN: [id number] DOB:  1934-11-11   ADMIT DATE: 04/05/2020  INDICATIONS:  Mitral regurgitation  PROCEDURE:   Informed consent was obtained prior to the procedure. The risks, benefits and alternatives for the procedure were discussed and the patient comprehended these risks.  Risks include, but are not limited to, cough, sore throat, vomiting, nausea, somnolence, esophageal and stomach trauma or perforation, bleeding, low blood pressure, aspiration, pneumonia, infection, trauma to the teeth and death.    After a procedural time-out, the patient was given 3 mg versed and 50 mcg fentanyl for moderate sedation.  The oropharynx was anesthetized 10 cc of topical 1% viscous lidocaine.  The transesophageal probe was inserted in the esophagus and stomach without difficulty and multiple views were obtained.    COMPLICATIONS:    There were no immediate complications.  FINDINGS:  LEFT VENTRICLE: EF = 55%. No regional wall motion abnormalities.  RIGHT VENTRICLE: Normal size and function.   LEFT ATRIUM: Severely dilated  LEFT ATRIAL APPENDAGE: No thrombus.   RIGHT ATRIUM: Mildly dilated  AORTIC VALVE:  Trileaflet. Mild calcification mild AI  MITRAL VALVE:    Normal. Mild calcification. Dilated mitral annulus from severe LA dilation. Mild restriction of posterior leaflet. Severe (4+) MR large central and posterior components with flow reversal in pulmonary veins.   TRICUSPID VALVE: Normal. Mild prolapse. Moderate to severe TR  PULMONIC VALVE: Grossly normal. Mild PI  INTERATRIAL SEPTUM: Small PFO with left to right shunting  PERICARDIUM: No effusion  DESCENDING AORTA: Severe plaque   Zarek Relph,MD 10:16 AM

## 2020-04-05 NOTE — Progress Notes (Signed)
  Echocardiogram Echocardiogram Transesophageal has been performed.  Oneal Deputy Renaldo Gornick 04/05/2020, 10:23 AM

## 2020-04-05 NOTE — Interval H&P Note (Signed)
History and Physical Interval Note:  04/05/2020 9:40 AM  [name]  has presented today for surgery, with the diagnosis of MITRAL REGURGITATION.  The various methods of treatment have been discussed with the patient and family. After consideration of risks, benefits and other options for treatment, the patient has consented to  Procedure(s): TRANSESOPHAGEAL ECHOCARDIOGRAM (TEE) (N/A) as a surgical intervention.  The patient's history has been reviewed, patient examined, no change in status, stable for surgery.  I have reviewed the patient's chart and labs.  Questions were answered to the patient's satisfaction.     Klyde Banka

## 2020-04-05 NOTE — Discharge Instructions (Signed)
Brachial Site Care   This sheet gives you information about how to care for yourself after your procedure. Your health care provider may also give you more specific instructions. If you have problems or questions, contact your health care provider. What can I expect after the procedure? After the procedure, it is common to have:  Bruising and tenderness at the catheter insertion area. Follow these instructions at home: Medicines  Take over-the-counter and prescription medicines only as told by your health care provider. Insertion site care 1. Follow instructions from your health care provider about how to take care of your insertion site. Make sure you: ? Wash your hands with soap and water before you change your bandage (dressing). If soap and water are not available, use hand sanitizer. ? Remove your dressing as told by your health care provider. In 24 hours 2. Check your insertion site every day for signs of infection. Check for: ? Redness, swelling, or pain. ? Fluid or blood. ? Pus or a bad smell. ? Warmth. 3. Do not take baths, swim, or use a hot tub until your health care provider approves. 4. You may shower 24-48 hours after the procedure, or as directed by your health care provider. ? Remove the dressing and gently wash the site with plain soap and water. ? Pat the area dry with a clean towel. ? Do not rub the site. That could cause bleeding. 5. Do not apply powder or lotion to the site. Activity   1. For 24 hours after the procedure, or as directed by your health care provider: ? Do not flex or bend the affected arm. ? Do not push or pull heavy objects with the affected arm. ? Do not drive yourself home from the hospital or clinic. You may drive 24 hours after the procedure unless your health care provider tells you not to. ? Do not operate machinery or power tools. 2. Do not lift anything that is heavier than 10 lb (4.5 kg), or the limit that you are told, until your  health care provider says that it is safe.  For 2 days 3. Ask your health care provider when it is okay to: ? Return to work or school. ? Resume usual physical activities or sports. ? Resume sexual activity. General instructions  If the catheter site starts to bleed, raise your arm and put firm pressure on the site.   If you went home on the same day as your procedure, a responsible adult should be with you for the first 24 hours after you arrive home.  Keep all follow-up visits as told by your health care provider. This is important. Contact a health care provider if:  You have a fever.  You have redness, swelling, or yellow drainage around your insertion site. Get help right away if:  You have unusual pain at the brachial site.  The catheter insertion area swells very fast.  The insertion area is bleeding, and the bleeding does not stop when you hold steady pressure on the area. These symptoms may represent a serious problem that is an emergency. Do not wait to see if the symptoms will go away. Get medical help right away. Call your local emergency services (911 in the [place] Do not drive yourself to the hospital. Summary  After the procedure, it is common to have bruising and tenderness at the site.  Follow instructions from your health care provider about how to take care of your radial site wound. Check the  wound every day for signs of infection.  Do not lift anything that is heavier than 10 lb (4.5 kg), or the limit that you are told, until your health care provider says that it is safe. This information is not intended to replace advice given to you by your health care provider. Make sure you discuss any questions you have with your health care provider. Document Revised: 06/26/2017 Document Reviewed: 06/26/2017 Elsevier Patient Education  2020 [employer].

## 2020-04-07 ENCOUNTER — Other Ambulatory Visit (HOSPITAL_COMMUNITY): Payer: Self-pay | Admitting: Internal Medicine

## 2020-04-07 ENCOUNTER — Encounter (HOSPITAL_COMMUNITY): Payer: Self-pay | Admitting: Internal Medicine

## 2020-04-15 ENCOUNTER — Ambulatory Visit: Payer: Medicare Other | Admitting: Cardiovascular Disease

## 2020-04-15 ENCOUNTER — Other Ambulatory Visit (HOSPITAL_COMMUNITY): Payer: Self-pay | Admitting: Internal Medicine

## 2020-04-15 ENCOUNTER — Other Ambulatory Visit: Payer: Self-pay

## 2020-04-15 ENCOUNTER — Encounter: Payer: Self-pay | Admitting: Cardiovascular Disease

## 2020-04-15 VITALS — BP 110/70 | HR 89 | Ht 68.0 in | Wt 135.0 lb

## 2020-04-15 DIAGNOSIS — I340 Nonrheumatic mitral (valve) insufficiency: Secondary | ICD-10-CM | POA: Diagnosis not present

## 2020-04-15 NOTE — Patient Instructions (Signed)
We will call you for next steps! Your tentative date for MitraClip is 12/9.

## 2020-04-15 NOTE — Progress Notes (Signed)
Cardiology Office Note:    Date:  04/15/2020   ID:  TYAISHA [name], DOB 14-Nov-1934, MRN [id number]  PCP:  Forrest Moron, MD  [place] Cardiologist:  No primary care provider on file.  [facility] Electrophysiologist:  None   Referring MD: Forrest Moron, MD   Chief Complaint  Patient presents with  . Shortness of Breath    History of Present Illness:    [name] is a 85 y.o. female referred for evaluation of severe mitral regurgitation, referred by Dr Haroldine Laws.   The patient is recently widowed. She is here with her son today. She has 4 children, 2 who live in [place], one in [place], and one in [place]. 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  The patient suffered an acute myocardial infarction of the anterolateral wall in 2019.  At that time she was noted to have multivessel coronary artery disease, but a large ramus intermedius branch was found to be the culprit vessel.  This vessel was totally occluded and was treated with overlapping drug-eluting stents.  There were recommendations to treat her residual coronary disease medically.  She was noted to have moderately severe LV systolic dysfunction at the time of her presentation with an LVEF of 35 to 40%.  The patient initially was noted to have moderate mitral regurgitation, but on serial echo studies she has been found to have progressive LV dilatation and worsening mitral regurgitation.  Mitral regurgitation was felt to be moderately severe in 2020.  However, her most recent echocardiogram in October 2021 demonstrated mild to moderate LV dysfunction with an LVEF of 40 to 45% with severe hypokinesis of the anterolateral wall and inferolateral walls.  She was felt to have severe secondary mitral regurgitation related to posterior leaflet tethering with an ER O of 0.4 cm, regurgitant fraction of 50%, and regurgitant volume of 60 mL.  She was  also demonstrated to have moderate tricuspid regurgitation and mild aortic valve regurgitation.  From a symptomatic perspective, the patient is functionally independent.  She lives alone and is able to drive an automobile and care for herself.  She still does some light yard work.  She does complain of intermittent dizziness and fatigue.  She is not able to walk very far anymore as she states that she gets generally tired and has to stop and rest.  She has some shortness of breath with activity but her primary limitation is generalized fatigue.  She was suffering from orthopnea but this has resolved.  She has no PND, leg swelling, or abdominal swelling.  She has not had frank syncope.  She has no central chest pain or pressure.  Past Medical History:  Diagnosis Date  . Anxiety   . Breast cancer of lower-outer quadrant of left female breast ([place]) 10/11/2014   treated with lumpectomy and radiation  . Coronary artery disease 07/27/2017  . GERD (gastroesophageal reflux disease)   . Hypertension   . Myocardial infarction ([place]) 07/27/2017   STEMI of anterolateral wall. Cath w/ stent and cabg by Dr. Martinique  . Osteoporosis 10/20/2014  . Personal history of radiation therapy 2016   treatment of breast cancer  . PONV (postoperative nausea and vomiting)   . PUD (peptic ulcer disease)   . Pure hypercholesterolemia 04/08/2014  . Stone, kidney     Past Surgical History:  Procedure Laterality Date  . BREAST LUMPECTOMY Left 2016  . BREAST  LUMPECTOMY WITH SENTINEL LYMPH NODE BIOPSY Left 10/22/2014   Procedure: BREAST LUMPECTOMY WITH SENTINEL LYMPH NODE BIOPSY;  Surgeon: Autumn Messing III, MD;  Location: [place];  Service: General;  Laterality: Left;  . CARDIOVERSION N/A 06/13/2018   Procedure: CARDIOVERSION;  Surgeon: Jolaine Artist, MD;  Location: [facility] ENDOSCOPY;  Service: Cardiovascular;  Laterality: N/A;  . CORONARY STENT INTERVENTION N/A 07/27/2017   Procedure: CORONARY STENT  INTERVENTION;  Surgeon: Martinique, Peter M, MD;  Location: [place] CV LAB;  Service: Cardiovascular;  Laterality: N/A;  . CORONARY/GRAFT ACUTE MI REVASCULARIZATION N/A 07/27/2017   Procedure: Coronary/Graft Acute MI Revascularization;  Surgeon: Martinique, Peter M, MD;  Location: [place] CV LAB;  Service: Cardiovascular;  Laterality: N/A;  . LEFT HEART CATH AND CORONARY ANGIOGRAPHY N/A 07/27/2017   Procedure: LEFT HEART CATH AND CORONARY ANGIOGRAPHY;  Surgeon: Martinique, Peter M, MD;  Location: [place] CV LAB;  Service: Cardiovascular;  Laterality: N/A;  . RE-EXCISION OF BREAST CANCER,SUPERIOR MARGINS Left 11/04/2014   Procedure: RE-EXCISION OF LEFT BREAST INFERIOR MARGINS;  Surgeon: Autumn Messing III, MD;  Location: [place];  Service: General;  Laterality: Left;  . RIGHT HEART CATH N/A 04/05/2020   Procedure: RIGHT HEART CATH;  Surgeon: Jolaine Artist, MD;  Location: [place] CV LAB;  Service: Cardiovascular;  Laterality: N/A;  . TEE WITHOUT CARDIOVERSION N/A 04/05/2020   Procedure: TRANSESOPHAGEAL ECHOCARDIOGRAM (TEE);  Surgeon: Jolaine Artist, MD;  Location: [facility] END[facility]COPY;  Service: Cardiovascular;  Laterality: N/A;    Current Medications: Current Meds  Medication Sig  . acetaminophen (TYLENOL) 500 MG tablet Take 500-1,000 mg by mouth every 6 (six) hours as needed (for pain.).  Marland Kitchen anastrozole (ARIMIDEX) 1 MG tablet Take 1 mg by mouth daily.  Marland Kitchen apixaban (ELIQUIS) 2.5 MG TABS tablet Take 1 tablet (2.5 mg total) by mouth 2 (two) times daily.  Marland Kitchen atorvastatin (LIPITOR) 80 MG tablet TAKE 1 TABLET BY MOUTH ONCE DAILY AT  6PM  . B-12, Methylcobalamin, 1000 MCG SUBL Take 1,000 mcg by mouth daily.  . Cholecalciferol (VITAMIN D-3) 125 MCG (5000 UT) TABS Take 5,000 Units by mouth every morning.  Marland Kitchen co-enzyme Q-10 30 MG capsule Take 30 mg by mouth 3 (three) times daily.  . Ferrous Sulfate (IRON PO) Take 25 mg by mouth daily.  . furosemide (LASIX) 20 MG tablet Take 20 mg by mouth as needed.  Marland Kitchen losartan  (COZAAR) 25 MG tablet TAKE 1 TABLET BY MOUTH TWICE DAILY . APPOINTMENT REQUIRED FOR FUTURE REFILLS  . Multiple Vitamin (MULTIVITAMIN WITH MINERALS) TABS tablet Take 1 tablet by mouth daily.  . nitroGLYCERIN (NITROSTAT) 0.3 MG SL tablet DISSOLVE ONE TABLET UNDER THE TONGUE EVERY 5 MINUTES AS NEEDED FOR CHEST PAIN.  DO NOT EXCEED A TOTAL OF 3 DOSES IN 15 MINUTES NOW  . Omega-3 Fatty Acids (FISH OIL PO) Take 1 capsule by mouth at bedtime.  Marland Kitchen spironolactone (ALDACTONE) 25 MG tablet Take 1 tablet by mouth once daily     Allergies:   Lisinopril   Social History   Socioeconomic History  . Marital status: Widowed    Spouse name: Not on file  . Number of children: Not on file  . Years of education: Not on file  . Highest education level: Not on file  Occupational History  . Occupation: retired  Tobacco Use  . Smoking status: Never Smoker  . Smokeless tobacco: Never Used  Vaping Use  . Vaping Use: Never used  Substance and Sexual Activity  .  Alcohol use: No  . Drug use: No  . Sexual activity: Not on file  Other Topics Concern  . Not on file  Social History Narrative  . Not on file   Social Determinants of Health   Financial Resource Strain:   . Difficulty of Paying Living Expenses: Not on file  Food Insecurity:   . Worried About Charity fundraiser in the Last Year: Not on file  . Ran Out of Food in the Last Year: Not on file  Transportation Needs:   . Lack of Transportation (Medical): Not on file  . Lack of Transportation (Non-Medical): Not on file  Physical Activity:   . Days of Exercise per Week: Not on file  . Minutes of Exercise per Session: Not on file  Stress:   . Feeling of Stress : Not on file  Social Connections:   . Frequency of Communication with Friends and Family: Not on file  . Frequency of Social Gatherings with Friends and Family: Not on file  . Attends Religious Services: Not on file  . Active Member of Clubs or Organizations: Not on file  . Attends English as a second language teacher Meetings: Not on file  . Marital Status: Not on file     Family History: The patient's family history includes Heart disease in her brother; Hypertension in her brother, brother, sister, and sister; Pancreatic cancer in her mother; Stroke in her father.  ROS:   Please see the history of present illness.    All other systems reviewed and are negative.  EKGs/Labs/Other Studies Reviewed:    The following studies were reviewed today: Echo 03-18-2020: 1. Left ventricular ejection fraction, by estimation, is 40 to 45%. The  left ventricle has mild to moderately decreased function. The left  ventricle demonstrates regional wall motion abnormalities (see scoring  diagram/findings for description). There is  mild concentric left ventricular hypertrophy. Left ventricular diastolic  function could not be evaluated. Elevated left atrial pressure. There is  severe hypokinesis of the left ventricular, entire anterolateral wall and  inferolateral wall.  2. Right ventricular systolic function is normal. The right ventricular  size is normal. There is moderately elevated pulmonary artery systolic  pressure. The estimated right ventricular systolic pressure is 87.8 mmHg.  3. Left atrial size was severely dilated.  4. Although there are myxomatous and degenerative changes of the mitral  leaflets, the major mechanism for mitral insufficiency appears to be  ischemic tethering of the posterior leaflet, rather than leaflet prolapse.  Effective regurgitant orifice area is  0.4 cm sq, regurgitant volume 60 mL, regurgitant fraction 50%. 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 Severe mitral valve regurgitation.  5. The tricuspid valve is myxomatous. Tricuspid valve regurgitation is  moderate.  6. The aortic valve is tricuspid. Aortic valve regurgitation is mild.  Mild aortic valve sclerosis is present, with no evidence of aortic valve  stenosis.  7. The inferior vena cava is dilated in  size with <50% respiratory  variability, suggesting right atrial pressure of 15 mmHg.  8. Evidence of atrial level shunting detected by color flow Doppler.  There is a small secundum ASD or patent foramen ovale with predominantly  left to right shunting across the atrial septum.   Comparison(s): A prior study was performed on 02/12/2019. Prior images  reviewed side by side. The anterolateral LV wall motion abnormality was  also seen at that time, but the inferolateral hypokinesis is new.   TEE 04/05/2020: FINDINGS:  LEFT VENTRICLE: EF = 55%. No  regional wall motion abnormalities.  RIGHT VENTRICLE: Normal size and function.   LEFT ATRIUM: Severely dilated  LEFT ATRIAL APPENDAGE: No thrombus.   RIGHT ATRIUM: Mildly dilated  AORTIC VALVE:  Trileaflet. Mild calcification mild AI  MITRAL VALVE:    Normal. Mild calcification. Dilated mitral annulus from severe LA dilation. Mild restriction of posterior leaflet. Severe (4+) MR large central and posterior components with flow reversal in pulmonary veins.   TRICUSPID VALVE: Normal. Mild prolapse. Moderate to severe TR  PULMONIC VALVE: Grossly normal. Mild PI  INTERATRIAL SEPTUM: Small PFO with left to right shunting  PERICARDIUM: No effusion  DESCENDING AORTA: Severe plaque   EKG:    The ekg from 10-15-201 demonstrates atrial fibrillation 101 bpm, incomplete RBBB, lateral infarct age-undetermined, nonspecific ST abnormality  Recent Labs: 03/18/2020: ALT 25; B Natriuretic Peptide 515.0; BUN 42; Creatinine, Ser 1.62; Platelets 193 04/05/2020: Hemoglobin 9.9; Potassium 4.8; Sodium 141  Recent Lipid Panel    Component Value Date/Time   CHOL 133 02/12/2019 1113   CHOL 156 01/10/2018 1000   TRIG 39 02/12/2019 1113   HDL 66 02/12/2019 1113   HDL 74 01/10/2018 1000   CHOLHDL 2.0 02/12/2019 1113   VLDL 8 02/12/2019 1113   LDLCALC 59 02/12/2019 1113   LDLCALC 70 01/10/2018 1000     Risk Assessment/Calculations:      CHA2DS2-VASc Score = 6  This indicates a 9.7% annual risk of stroke. The patient's score is based upon: CHF History: 1 HTN History: 1 Diabetes History: 0 Stroke History: 0 Vascular Disease History: 1 Age Score: 2 Gender Score: 1      Physical Exam:    VS:  BP 110/70   Pulse 89   Ht 5\' 8"  (1.727 m)   Wt 135 lb (61.2 kg)   SpO2 97%   BMI 20.53 kg/m     Wt Readings from Last 3 Encounters:  04/15/20 135 lb (61.2 kg)  04/05/20 135 lb 3.2 oz (61.3 kg)  03/18/20 135 lb 3.2 oz (61.3 kg)     GEN:  Well nourished, well developed very pleasant elderly woman in no acute distress HEENT: Normal NECK: No JVD; No carotid bruits LYMPHATICS: No lymphadenopathy CARDIAC: irregular, 3/6 holosystolic murmur at the apex/LLSB RESPIRATORY:  Clear to auscultation without rales, wheezing or rhonchi  ABDOMEN: Soft, non-tender, non-distended MUSCULOSKELETAL:  No edema; No deformity  SKIN: Warm and dry NEUROLOGIC:  Alert and oriented x 3 PSYCHIATRIC:  Normal affect   STS RIsk Calculator: MItral Valve Repair:  Risk of Mortality: 4.168% Renal Failure: 6.050% Permanent Stroke: 2.585% Prolonged Ventilation: 17.187% DSW Infection: 0.024% Reoperation: 5.822% Morbidity or Mortality: 23.588% Short Length of Stay: 10.171% Long Length of Stay: 14.226%   Mitral Valve Replacement:  Risk of Mortality: 6.391% Renal Failure: 6.248% Permanent Stroke: 1.901% Prolonged Ventilation: 21.276% DSW Infection: 0.046% Reoperation: 7.660% Morbidity or Mortality: 29.865% Short Length of Stay: 7.340% Long Length of Stay: 19.308%   ASSESSMENT:    1. Nonrheumatic mitral valve regurgitation    PLAN:    In order of problems listed above:  1. The patient has severe, stage D1 nonrheumatic mitral insufficiency associated with New York Heart Association functional class II symptoms of chronic systolic heart failure with moderate LV systolic dysfunction.  I have personally reviewed her  echo and cardiac catheterization images and data.  I think that primary etiology of her mitral regurgitation is related to posterior leaflet restriction and annular dilatation (ischemic mitral regurgitation).  She has been followed closely in the advanced heart  failure clinic by Dr. Haroldine Laws.  We have discussed potential treatment options and she has undergone further evaluation with recommendations for transcatheter edge-to-edge mitral valve repair.  I have discussed clinical trial data comparing ongoing medical therapy with transcatheter edge-to-edge mitral valve repair in patients with secondary mitral regurgitation.  She and her son understand that this data supports improvement and functional class, reduction in heart failure hospitalization, and possible reduction in mortality related to congestive heart failure.  I have reviewed potential risks of transcatheter mitral valve repair with the patient today.  We discussed risks vascular injury, infection, bleeding, device embolization, stroke, myocardial infarction, cardiac injury including mitral valve leaflet injury, cardiac tamponade, emergency cardiac surgery, arrhythmia, and death.  They understand the risk of serious complication is low, and a range of approximately 2 to 3%.  In review of her transesophageal echo, I think there is a high likelihood of successful repair with transcatheter edge-to-edge repair using MitraClip.  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  I discussed this specifically with the patient and her son today.  The patient has been followed closely by an advanced heart failure specialist who has participated in her evaluation as part of a multidisciplinary approach to her care.  She has not been able to tolerate medical therapy for chronic systolic heart failure because of low blood pressure and fatigue.  She is currently treated with losartan and spironolactone but was  unable to tolerate carvedilol.  After review of potential risks and benefits, the patient would like to proceed with transcatheter edge-to-edge mitral valve repair.  Full informed consent is obtained.    Shared Decision Making/Informed Consent      Medication Adjustments/Labs and Tests Ordered: Current medicines are reviewed at length with the patient today.  Concerns regarding medicines are outlined above.  No orders of the defined types were placed in this encounter.  No orders of the defined types were placed in this encounter.   Patient Instructions  We will call you for next steps! Your tentative date for MitraClip is 12/9.    Signed, Sherren Mocha, MD  04/15/2020 10:15 AM    [place]

## 2020-04-15 NOTE — H&P (View-Only) (Signed)
Cardiology Office Note:    Date:  04/15/2020   ID:  [name], DOB 1934-08-10, MRN [id number]  PCP:  Forrest Moron, MD  [facility] Cardiologist:  No primary care provider on file.  [facility] Electrophysiologist:  None   Referring MD: Forrest Moron, MD   Chief Complaint  Patient presents with  . Shortness of Breath    History of Present Illness:    [name] is a 85 y.o. female referred for evaluation of severe mitral regurgitation, referred by Dr Haroldine Laws.   The patient is recently widowed. She is here with her son today. She has 4 children, 2 who live in [place], one in [place], and one in [place]. 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  The patient suffered an acute myocardial infarction of the anterolateral wall in 2019.  At that time she was noted to have multivessel coronary artery disease, but a large ramus intermedius branch was found to be the culprit vessel.  This vessel was totally occluded and was treated with overlapping drug-eluting stents.  There were recommendations to treat her residual coronary disease medically.  She was noted to have moderately severe LV systolic dysfunction at the time of her presentation with an LVEF of 35 to 40%.  The patient initially was noted to have moderate mitral regurgitation, but on serial echo studies she has been found to have progressive LV dilatation and worsening mitral regurgitation.  Mitral regurgitation was felt to be moderately severe in 2020.  However, her most recent echocardiogram in October 2021 demonstrated mild to moderate LV dysfunction with an LVEF of 40 to 45% with severe hypokinesis of the anterolateral wall and inferolateral walls.  She was felt to have severe secondary mitral regurgitation related to posterior leaflet tethering with an ER O of 0.4 cm, regurgitant fraction of 50%, and regurgitant volume of 60 mL.  She was  also demonstrated to have moderate tricuspid regurgitation and mild aortic valve regurgitation.  From a symptomatic perspective, the patient is functionally independent.  She lives alone and is able to drive an automobile and care for herself.  She still does some light yard work.  She does complain of intermittent dizziness and fatigue.  She is not able to walk very far anymore as she states that she gets generally tired and has to stop and rest.  She has some shortness of breath with activity but her primary limitation is generalized fatigue.  She was suffering from orthopnea but this has resolved.  She has no PND, leg swelling, or abdominal swelling.  She has not had frank syncope.  She has no central chest pain or pressure.  Past Medical History:  Diagnosis Date  . Anxiety   . Breast cancer of lower-outer quadrant of left female breast ([place]) 10/11/2014   treated with lumpectomy and radiation  . Coronary artery disease 07/27/2017  . GERD (gastroesophageal reflux disease)   . Hypertension   . Myocardial infarction ([place]) 07/27/2017   STEMI of anterolateral wall. Cath w/ stent and cabg by Dr. Martinique  . Osteoporosis 10/20/2014  . Personal history of radiation therapy 2016   treatment of breast cancer  . PONV (postoperative nausea and vomiting)   . PUD (peptic ulcer disease)   . Pure hypercholesterolemia 04/08/2014  . Stone, kidney     Past Surgical History:  Procedure Laterality Date  . BREAST LUMPECTOMY Left 2016  . BREAST  LUMPECTOMY WITH SENTINEL LYMPH NODE BIOPSY Left 10/22/2014   Procedure: BREAST LUMPECTOMY WITH SENTINEL LYMPH NODE BIOPSY;  Surgeon: Autumn Messing III, MD;  Location: [facility];  Service: General;  Laterality: Left;  . CARDIOVERSION N/A 06/13/2018   Procedure: CARDIOVERSION;  Surgeon: Jolaine Artist, MD;  Location: [facility] ENDOSCOPY;  Service: Cardiovascular;  Laterality: N/A;  . CORONARY STENT INTERVENTION N/A 07/27/2017   Procedure: CORONARY STENT  INTERVENTION;  Surgeon: Martinique, Peter M, MD;  Location: [facility] [place] [facility] CV LAB;  Service: Cardiovascular;  Laterality: N/A;  . CORONARY/GRAFT ACUTE MI REVASCULARIZATION N/A 07/27/2017   Procedure: Coronary/Graft Acute MI Revascularization;  Surgeon: Martinique, Peter M, MD;  Location: [place] CV LAB;  Service: Cardiovascular;  Laterality: N/A;  . LEFT HEART CATH AND CORONARY ANGIOGRAPHY N/A 07/27/2017   Procedure: LEFT HEART CATH AND CORONARY ANGIOGRAPHY;  Surgeon: Martinique, Peter M, MD;  Location: [place] CV LAB;  Service: Cardiovascular;  Laterality: N/A;  . RE-EXCISION OF BREAST CANCER,SUPERIOR MARGINS Left 11/04/2014   Procedure: RE-EXCISION OF LEFT BREAST INFERIOR MARGINS;  Surgeon: Autumn Messing III, MD;  Location: [facility];  Service: General;  Laterality: Left;  . RIGHT HEART CATH N/A 04/05/2020   Procedure: RIGHT HEART CATH;  Surgeon: Jolaine Artist, MD;  Location: [place] CV LAB;  Service: Cardiovascular;  Laterality: N/A;  . TEE WITHOUT CARDIOVERSION N/A 04/05/2020   Procedure: TRANSESOPHAGEAL ECHOCARDIOGRAM (TEE);  Surgeon: Jolaine Artist, MD;  Location: [facility] ENDOSCOPY;  Service: Cardiovascular;  Laterality: N/A;    Current Medications: Current Meds  Medication Sig  . acetaminophen (TYLENOL) 500 MG tablet Take 500-1,000 mg by mouth every 6 (six) hours as needed (for pain.).  Marland Kitchen anastrozole (ARIMIDEX) 1 MG tablet Take 1 mg by mouth daily.  Marland Kitchen apixaban (ELIQUIS) 2.5 MG TABS tablet Take 1 tablet (2.5 mg total) by mouth 2 (two) times daily.  Marland Kitchen atorvastatin (LIPITOR) 80 MG tablet TAKE 1 TABLET BY MOUTH ONCE DAILY AT  6PM  . B-12, Methylcobalamin, 1000 MCG SUBL Take 1,000 mcg by mouth daily.  . Cholecalciferol (VITAMIN D-3) 125 MCG (5000 UT) TABS Take 5,000 Units by mouth every morning.  Marland Kitchen co-enzyme Q-10 30 MG capsule Take 30 mg by mouth 3 (three) times daily.  . Ferrous Sulfate (IRON PO) Take 25 mg by mouth daily.  . furosemide (LASIX) 20 MG tablet Take 20 mg by mouth as needed.  Marland Kitchen losartan  (COZAAR) 25 MG tablet TAKE 1 TABLET BY MOUTH TWICE DAILY . APPOINTMENT REQUIRED FOR FUTURE REFILLS  . Multiple Vitamin (MULTIVITAMIN WITH MINERALS) TABS tablet Take 1 tablet by mouth daily.  . nitroGLYCERIN (NITROSTAT) 0.3 MG SL tablet DISSOLVE ONE TABLET UNDER THE TONGUE EVERY 5 MINUTES AS NEEDED FOR CHEST PAIN.  DO NOT EXCEED A TOTAL OF 3 DOSES IN 15 MINUTES NOW  . Omega-3 Fatty Acids (FISH OIL PO) Take 1 capsule by mouth at bedtime.  Marland Kitchen spironolactone (ALDACTONE) 25 MG tablet Take 1 tablet by mouth once daily     Allergies:   Lisinopril   Social History   Socioeconomic History  . Marital status: Widowed    Spouse name: Not on file  . Number of children: Not on file  . Years of education: Not on file  . Highest education level: Not on file  Occupational History  . Occupation: retired  Tobacco Use  . Smoking status: Never Smoker  . Smokeless tobacco: Never Used  Vaping Use  . Vaping Use: Never used  Substance and Sexual Activity  .  Alcohol use: No  . Drug use: No  . Sexual activity: Not on file  Other Topics Concern  . Not on file  Social History Narrative  . Not on file   Social Determinants of Health   Financial Resource Strain:   . Difficulty of Paying Living Expenses: Not on file  Food Insecurity:   . Worried About Charity fundraiser in the Last Year: Not on file  . Ran Out of Food in the Last Year: Not on file  Transportation Needs:   . Lack of Transportation (Medical): Not on file  . Lack of Transportation (Non-Medical): Not on file  Physical Activity:   . Days of Exercise per Week: Not on file  . Minutes of Exercise per Session: Not on file  Stress:   . Feeling of Stress : Not on file  Social Connections:   . Frequency of Communication with Friends and Family: Not on file  . Frequency of Social Gatherings with Friends and Family: Not on file  . Attends Religious Services: Not on file  . Active Member of Clubs or Organizations: Not on file  . Attends English as a second language teacher Meetings: Not on file  . Marital Status: Not on file     Family History: The patient's family history includes Heart disease in her brother; Hypertension in her brother, brother, sister, and sister; Pancreatic cancer in her mother; Stroke in her father.  ROS:   Please see the history of present illness.    All other systems reviewed and are negative.  EKGs/Labs/Other Studies Reviewed:    The following studies were reviewed today: Echo 03-18-2020: 1. Left ventricular ejection fraction, by estimation, is 40 to 45%. The  left ventricle has mild to moderately decreased function. The left  ventricle demonstrates regional wall motion abnormalities (see scoring  diagram/findings for description). There is  mild concentric left ventricular hypertrophy. Left ventricular diastolic  function could not be evaluated. Elevated left atrial pressure. There is  severe hypokinesis of the left ventricular, entire anterolateral wall and  inferolateral wall.  2. Right ventricular systolic function is normal. The right ventricular  size is normal. There is moderately elevated pulmonary artery systolic  pressure. The estimated right ventricular systolic pressure is 20.9 mmHg.  3. Left atrial size was severely dilated.  4. Although there are myxomatous and degenerative changes of the mitral  leaflets, the major mechanism for mitral insufficiency appears to be  ischemic tethering of the posterior leaflet, rather than leaflet prolapse.  Effective regurgitant orifice area is  0.4 cm sq, regurgitant volume 60 mL, regurgitant fraction 50%. 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 Severe mitral valve regurgitation.  5. The tricuspid valve is myxomatous. Tricuspid valve regurgitation is  moderate.  6. The aortic valve is tricuspid. Aortic valve regurgitation is mild.  Mild aortic valve sclerosis is present, with no evidence of aortic valve  stenosis.  7. The inferior vena cava is dilated in  size with <50% respiratory  variability, suggesting right atrial pressure of 15 mmHg.  8. Evidence of atrial level shunting detected by color flow Doppler.  There is a small secundum ASD or patent foramen ovale with predominantly  left to right shunting across the atrial septum.   Comparison(s): A prior study was performed on 02/12/2019. Prior images  reviewed side by side. The anterolateral LV wall motion abnormality was  also seen at that time, but the inferolateral hypokinesis is new.   TEE 04/05/2020: FINDINGS:  LEFT VENTRICLE: EF = 55%. No  regional wall motion abnormalities.  RIGHT VENTRICLE: Normal size and function.   LEFT ATRIUM: Severely dilated  LEFT ATRIAL APPENDAGE: No thrombus.   RIGHT ATRIUM: Mildly dilated  AORTIC VALVE:  Trileaflet. Mild calcification mild AI  MITRAL VALVE:    Normal. Mild calcification. Dilated mitral annulus from severe LA dilation. Mild restriction of posterior leaflet. Severe (4+) MR large central and posterior components with flow reversal in pulmonary veins.   TRICUSPID VALVE: Normal. Mild prolapse. Moderate to severe TR  PULMONIC VALVE: Grossly normal. Mild PI  INTERATRIAL SEPTUM: Small PFO with left to right shunting  PERICARDIUM: No effusion  DESCENDING AORTA: Severe plaque   EKG:    The ekg from 10-15-201 demonstrates atrial fibrillation 101 bpm, incomplete RBBB, lateral infarct age-undetermined, nonspecific ST abnormality  Recent Labs: 03/18/2020: ALT 25; B Natriuretic Peptide 515.0; BUN 42; Creatinine, Ser 1.62; Platelets 193 04/05/2020: Hemoglobin 9.9; Potassium 4.8; Sodium 141  Recent Lipid Panel    Component Value Date/Time   CHOL 133 02/12/2019 1113   CHOL 156 01/10/2018 1000   TRIG 39 02/12/2019 1113   HDL 66 02/12/2019 1113   HDL 74 01/10/2018 1000   CHOLHDL 2.0 02/12/2019 1113   VLDL 8 02/12/2019 1113   LDLCALC 59 02/12/2019 1113   LDLCALC 70 01/10/2018 1000     Risk Assessment/Calculations:      CHA2DS2-VASc Score = 6  This indicates a 9.7% annual risk of stroke. The patient's score is based upon: CHF History: 1 HTN History: 1 Diabetes History: 0 Stroke History: 0 Vascular Disease History: 1 Age Score: 2 Gender Score: 1      Physical Exam:    VS:  BP 110/70   Pulse 89   Ht 5\' 8"  (1.727 m)   Wt 135 lb (61.2 kg)   SpO2 97%   BMI 20.53 kg/m     Wt Readings from Last 3 Encounters:  04/15/20 135 lb (61.2 kg)  04/05/20 135 lb 3.2 oz (61.3 kg)  03/18/20 135 lb 3.2 oz (61.3 kg)     GEN:  Well nourished, well developed very pleasant elderly woman in no acute distress HEENT: Normal NECK: No JVD; No carotid bruits LYMPHATICS: No lymphadenopathy CARDIAC: irregular, 3/6 holosystolic murmur at the apex/LLSB RESPIRATORY:  Clear to auscultation without rales, wheezing or rhonchi  ABDOMEN: Soft, non-tender, non-distended MUSCULOSKELETAL:  No edema; No deformity  SKIN: Warm and dry NEUROLOGIC:  Alert and oriented x 3 PSYCHIATRIC:  Normal affect   STS RIsk Calculator: MItral Valve Repair:  Risk of Mortality: 4.168% Renal Failure: 6.050% Permanent Stroke: 2.585% Prolonged Ventilation: 17.187% DSW Infection: 0.024% Reoperation: 5.822% Morbidity or Mortality: 23.588% Short Length of Stay: 10.171% Long Length of Stay: 14.226%   Mitral Valve Replacement:  Risk of Mortality: 6.391% Renal Failure: 6.248% Permanent Stroke: 1.901% Prolonged Ventilation: 21.276% DSW Infection: 0.046% Reoperation: 7.660% Morbidity or Mortality: 29.865% Short Length of Stay: 7.340% Long Length of Stay: 19.308%   ASSESSMENT:    1. Nonrheumatic mitral valve regurgitation    PLAN:    In order of problems listed above:  1. The patient has severe, stage D1 nonrheumatic mitral insufficiency associated with New York Heart Association functional class II symptoms of chronic systolic heart failure with moderate LV systolic dysfunction.  I have personally reviewed her  echo and cardiac catheterization images and data.  I think that primary etiology of her mitral regurgitation is related to posterior leaflet restriction and annular dilatation (ischemic mitral regurgitation).  She has been followed closely in the advanced heart  failure clinic by Dr. Haroldine Laws.  We have discussed potential treatment options and she has undergone further evaluation with recommendations for transcatheter edge-to-edge mitral valve repair.  I have discussed clinical trial data comparing ongoing medical therapy with transcatheter edge-to-edge mitral valve repair in patients with secondary mitral regurgitation.  She and her son understand that this data supports improvement and functional class, reduction in heart failure hospitalization, and possible reduction in mortality related to congestive heart failure.  I have reviewed potential risks of transcatheter mitral valve repair with the patient today.  We discussed risks vascular injury, infection, bleeding, device embolization, stroke, myocardial infarction, cardiac injury including mitral valve leaflet injury, cardiac tamponade, emergency cardiac surgery, arrhythmia, and death.  They understand the risk of serious complication is low, and a range of approximately 2 to 3%.  In review of her transesophageal echo, I think there is a high likelihood of successful repair with transcatheter edge-to-edge repair using MitraClip.  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  I discussed this specifically with the patient and her son today.  The patient has been followed closely by an advanced heart failure specialist who has participated in her evaluation as part of a multidisciplinary approach to her care.  She has not been able to tolerate medical therapy for chronic systolic heart failure because of low blood pressure and fatigue.  She is currently treated with losartan and spironolactone but was  unable to tolerate carvedilol.  After review of potential risks and benefits, the patient would like to proceed with transcatheter edge-to-edge mitral valve repair.  Full informed consent is obtained.    Shared Decision Making/Informed Consent      Medication Adjustments/Labs and Tests Ordered: Current medicines are reviewed at length with the patient today.  Concerns regarding medicines are outlined above.  No orders of the defined types were placed in this encounter.  No orders of the defined types were placed in this encounter.   Patient Instructions  We will call you for next steps! Your tentative date for MitraClip is 12/9.    Signed, Sherren Mocha, MD  04/15/2020 10:15 AM    [place]

## 2020-04-21 ENCOUNTER — Other Ambulatory Visit (HOSPITAL_COMMUNITY): Payer: Self-pay | Admitting: Internal Medicine

## 2020-04-25 ENCOUNTER — Other Ambulatory Visit: Payer: Self-pay

## 2020-04-25 DIAGNOSIS — I340 Nonrheumatic mitral (valve) insufficiency: Secondary | ICD-10-CM

## 2020-05-06 NOTE — Progress Notes (Signed)
[place], [place] [address] Phone: [phone] Fax: [phone]  [place], [address] [place], [place] [address] Phone: [phone] Fax: [phone]  [place], [place] [address] [place] [address] Phone: [phone] Fax: [phone]      Your procedure is scheduled on 05/12/2020.  Report to [facility] Main Entrance "A" at 10:15 A.M., and check in at the Admitting office.  Call this number if you have problems the morning of surgery:  [phone]  Call [phone] if you have any questions prior to your surgery date Monday-Friday 8am-4pm    Remember:  Do not eat or drink after midnight the night before your surgery    Take these medicines the morning of surgery with A SIP OF WATER  anastrozole (ARIMIDEX)  acetaminophen (TYLENOL) - if needed nitroGLYCERIN (NITROSTAT) -if needed   As of today, STOP taking any Aspirin (unless otherwise instructed by your surgeon) Aleve, Naproxen, Ibuprofen, Motrin, Advil, Goody's, BC's, all herbal medications, fish oil, and all vitamins.  ^^STOP TAKING ELIQUIS ON 05/10/20. YOUR LAST DOSE SHOULD BE 05/09/2020(MONDAY).Marland Kitchen                      Do not wear jewelry, make up, or nail polish            Do not wear lotions, powders, perfumes/colognes, or deodorant.            Do not shave 48 hours prior to surgery.             Do not bring valuables to the hospital.            [facility] is not responsible for any belongings or valuables.  Do NOT Smoke (Tobacco/Vaping) or drink Alcohol 24 hours prior to your procedure If you use a CPAP at night, you may bring all equipment for your overnight stay.   Contacts, glasses, dentures or bridgework may not be worn into surgery.      For patients admitted to the  hospital, discharge time will be determined by your treatment team.   Patients discharged the day of surgery will not be allowed to drive home, and someone needs to stay with them for 24 hours.    Special instructions:   [facility]- Preparing For Surgery  Before surgery, you can play an important role. Because skin is not sterile, your skin needs to be as free of germs as possible. You can reduce the number of germs on your skin by washing with CHG (chlorahexidine gluconate) Soap before surgery.  CHG is an antiseptic cleaner which kills germs and bonds with the skin to continue killing germs even after washing.    Oral Hygiene is also important to reduce your risk of infection.  Remember - BRUSH YOUR TEETH THE MORNING OF SURGERY WITH YOUR REGULAR TOOTHPASTE  Please do not use if you have an allergy to CHG or antibacterial soaps. If your skin becomes reddened/irritated stop using the CHG.  Do not shave (including legs and underarms) for at least 48 hours prior to first CHG shower. It is OK to shave your face.  Please follow these instructions carefully.   1. Shower the NIGHT BEFORE SURGERY and the MORNING OF SURGERY with CHG Soap.   2. If you chose to wash  your hair, wash your hair first as usual with your normal shampoo.  3. After you shampoo, rinse your hair and body thoroughly to remove the shampoo.  4. Use CHG as you would any other liquid soap. You can apply CHG directly to the skin and wash gently with a scrungie or a clean washcloth.   5. Apply the CHG Soap to your body ONLY FROM THE NECK DOWN.  Do not use on open wounds or open sores. Avoid contact with your eyes, ears, mouth and genitals (private parts). Wash Face and genitals (private parts)  with your normal soap.   6. Wash thoroughly, paying special attention to the area where your surgery will be performed.  7. Thoroughly rinse your body with warm water from the neck down.  8. DO NOT shower/wash with your normal soap  after using and rinsing off the CHG Soap.  9. Pat yourself dry with a CLEAN TOWEL.  10. Wear CLEAN PAJAMAS to bed the night before surgery  11. Place CLEAN SHEETS on your bed the night of your first shower and DO NOT SLEEP WITH PETS.   Day of Surgery: Wear Clean/Comfortable clothing the morning of surgery Do not apply any deodorants/lotions.   Remember to brush your teeth WITH YOUR REGULAR TOOTHPASTE.   Please read over the following fact sheets that you were given.

## 2020-05-09 ENCOUNTER — Encounter (HOSPITAL_COMMUNITY): Payer: Self-pay

## 2020-05-09 ENCOUNTER — Ambulatory Visit (HOSPITAL_COMMUNITY)
Admission: RE | Admit: 2020-05-09 | Discharge: 2020-05-09 | Disposition: A | Discharge status: Home / Self Care | Payer: Medicare Other | Source: Ambulatory Visit | Attending: Cardiovascular Disease | Admitting: Cardiovascular Disease

## 2020-05-09 ENCOUNTER — Encounter: Payer: Self-pay | Admitting: Physical Therapy

## 2020-05-09 ENCOUNTER — Other Ambulatory Visit: Payer: Self-pay

## 2020-05-09 ENCOUNTER — Ambulatory Visit: Payer: Medicare Other | Attending: Cardiovascular Disease | Admitting: Physical Therapy

## 2020-05-09 ENCOUNTER — Encounter (HOSPITAL_COMMUNITY)
Admission: RE | Admit: 2020-05-09 | Discharge: 2020-05-09 | Disposition: A | Discharge status: Home / Self Care | Payer: Medicare Other | Source: Ambulatory Visit | Attending: Cardiovascular Disease | Admitting: Cardiovascular Disease

## 2020-05-09 DIAGNOSIS — R2689 Other abnormalities of gait and mobility: Secondary | ICD-10-CM | POA: Diagnosis not present

## 2020-05-09 DIAGNOSIS — I340 Nonrheumatic mitral (valve) insufficiency: Secondary | ICD-10-CM | POA: Insufficient documentation

## 2020-05-09 HISTORY — DX: Unspecified atrial fibrillation: I48.91

## 2020-05-09 HISTORY — DX: Anemia, unspecified: D64.9

## 2020-05-09 HISTORY — DX: Chronic systolic (congestive) heart failure: I50.22

## 2020-05-09 HISTORY — DX: Nonrheumatic mitral (valve) insufficiency: I34.0

## 2020-05-09 HISTORY — DX: Chronic kidney disease, unspecified: N18.9

## 2020-05-09 LAB — HEMOGLOBIN A1C
Hgb A1c MFr Bld: 6.1 % — ABNORMAL HIGH (ref 4.8–5.6)
Mean Plasma Glucose: 128.37 mg/dL

## 2020-05-09 LAB — SURGICAL PCR SCREEN
MRSA, PCR: NEGATIVE
Staphylococcus aureus: NEGATIVE

## 2020-05-09 LAB — URINALYSIS, ROUTINE W REFLEX MICROSCOPIC
Bilirubin Urine: NEGATIVE
Glucose, UA: NEGATIVE mg/dL
Hgb urine dipstick: NEGATIVE
Ketones, ur: NEGATIVE mg/dL
Leukocytes,Ua: NEGATIVE
Nitrite: NEGATIVE
Protein, ur: NEGATIVE mg/dL
Specific Gravity, Urine: 1.012 (ref 1.005–1.030)
pH: 5 (ref 5.0–8.0)

## 2020-05-09 LAB — CBC
HCT: 31.5 % — ABNORMAL LOW (ref 36.0–46.0)
Hemoglobin: 10.1 g/dL — ABNORMAL LOW (ref 12.0–15.0)
MCH: 31.7 pg (ref 26.0–34.0)
MCHC: 32.1 g/dL (ref 30.0–36.0)
MCV: 98.7 fL (ref 80.0–100.0)
Platelets: 196 10*3/uL (ref 150–400)
RBC: 3.19 MIL/uL — ABNORMAL LOW (ref 3.87–5.11)
RDW: 13.3 % (ref 11.5–15.5)
WBC: 4.6 10*3/uL (ref 4.0–10.5)
nRBC: 0 % (ref 0.0–0.2)

## 2020-05-09 LAB — COMPREHENSIVE METABOLIC PANEL
ALT: 20 U/L (ref 0–44)
AST: 23 U/L (ref 15–41)
Albumin: 3.4 g/dL — ABNORMAL LOW (ref 3.5–5.0)
Alkaline Phosphatase: 45 U/L (ref 38–126)
Anion gap: 10 (ref 5–15)
BUN: 46 mg/dL — ABNORMAL HIGH (ref 8–23)
CO2: 18 mmol/L — ABNORMAL LOW (ref 22–32)
Calcium: 11 mg/dL — ABNORMAL HIGH (ref 8.9–10.3)
Chloride: 109 mmol/L (ref 98–111)
Creatinine, Ser: 1.72 mg/dL — ABNORMAL HIGH (ref 0.44–1.00)
GFR, Estimated: 29 mL/min — ABNORMAL LOW (ref >60)
Glucose, Bld: 100 mg/dL — ABNORMAL HIGH (ref 70–99)
Potassium: 4.8 mmol/L (ref 3.5–5.1)
Sodium: 137 mmol/L (ref 135–145)
Total Bilirubin: 1.2 mg/dL (ref 0.3–1.2)
Total Protein: 7.1 g/dL (ref 6.5–8.1)

## 2020-05-09 LAB — BLOOD GAS, ARTERIAL
Acid-base deficit: 5.1 mmol/L — ABNORMAL HIGH (ref 0.0–2.0)
Bicarbonate: 19 mmol/L — ABNORMAL LOW (ref 20.0–28.0)
FIO2: 21
O2 Saturation: 98.5 %
Patient temperature: 37
pCO2 arterial: 32.3 mmHg (ref 32.0–48.0)
pH, Arterial: 7.387 (ref 7.350–7.450)
pO2, Arterial: 122 mmHg — ABNORMAL HIGH (ref 83.0–108.0)

## 2020-05-09 LAB — TYPE AND SCREEN
ABO/RH(D): AB POS
Antibody Screen: NEGATIVE

## 2020-05-09 LAB — BRAIN NATRIURETIC PEPTIDE: B Natriuretic Peptide: 500.8 pg/mL — ABNORMAL HIGH (ref 0.0–100.0)

## 2020-05-09 LAB — PROTIME-INR
INR: 1.4 — ABNORMAL HIGH (ref 0.8–1.2)
Prothrombin Time: 16.9 seconds — ABNORMAL HIGH (ref 11.4–15.2)

## 2020-05-09 LAB — APTT: aPTT: 41 seconds — ABNORMAL HIGH (ref 24–36)

## 2020-05-09 NOTE — Progress Notes (Signed)
PCP - Dr. Delia Chimes Cardiologist - Dr. Haroldine Laws  Chest x-ray - 05/09/20 EKG - 05/09/20 Stress Test - denies ECHO - 03/18/20 Cardiac Cath - 04/05/20  Sleep Study - denies CPAP - denies  Blood Thinner Instructions:Eliquis; hold 12/7. LD 05/09/20. Aspirin Instructions:n/a  COVID TEST- 05/10/20.   Anesthesia review: Yes, heart history.   Patient denies shortness of breath, fever, cough and chest pain at PAT appointment   All instructions explained to the patient, with a verbal understanding of the material. Patient agrees to go over the instructions while at home for a better understanding. Patient also instructed to self quarantine after being tested for COVID-19. The opportunity to ask questions was provided.   Coronavirus Screening  Have you experienced the following symptoms:  Cough yes/no: No Fever (>100.70F)  yes/no: No Runny nose yes/no: No Sore throat yes/no: No Difficulty breathing/shortness of breath  yes/no: No  Have you or a family member traveled in the last 14 days and where? yes/no: No   If the patient indicates "YES" to the above questions, their PAT will be rescheduled to limit the exposure to others and, the surgeon will be notified. THE PATIENT WILL NEED TO BE ASYMPTOMATIC FOR 14 DAYS.   If the patient is not experiencing any of these symptoms, the PAT nurse will instruct them to NOT bring anyone with them to their appointment since they may have these symptoms or traveled as well.   Please remind your patients and families that hospital visitation restrictions are in effect and the importance of the restrictions.

## 2020-05-09 NOTE — Therapy (Signed)
[place], [place], [address] Phone: [phone]   Fax:  [phone]  Physical Therapy Evaluation  Patient Details  Name: [name] MRN: [id number] Date of Birth: 1935/02/04 Referring Provider (PT): Dr Sherren Mocha    Encounter Date: 05/09/2020   PT End of Session - 05/09/20 1229    Visit Number 1    Number of Visits 1    Date for PT Re-Evaluation 05/16/20    PT Start Time 1761    PT Stop Time 1220    PT Time Calculation (min) 35 min    Activity Tolerance Patient tolerated treatment well    Behavior During Therapy [facility] for tasks assessed/performed           Past Medical History:  Diagnosis Date  . Anemia   . Anxiety   . Breast cancer of lower-outer quadrant of left female breast ([place]) 10/11/2014   treated with lumpectomy and radiation  . Coronary artery disease 07/27/2017  . GERD (gastroesophageal reflux disease)   . Hypertension   . Myocardial infarction ([place]) 07/27/2017   STEMI of anterolateral wall. Cath w/ stent and cabg by Dr. Martinique  . Osteoporosis 10/20/2014  . Personal history of radiation therapy 2016   treatment of breast cancer  . PONV (postoperative nausea and vomiting)    pt denies this.  . PUD (peptic ulcer disease)   . Pure hypercholesterolemia 04/08/2014  . Stone, kidney     Past Surgical History:  Procedure Laterality Date  . BREAST LUMPECTOMY Left 2016  . BREAST LUMPECTOMY WITH SENTINEL LYMPH NODE BIOPSY Left 10/22/2014   Procedure: BREAST LUMPECTOMY WITH SENTINEL LYMPH NODE BIOPSY;  Surgeon: Autumn Messing III, MD;  Location: [place];  Service: General;  Laterality: Left;  . CARDIAC CATHETERIZATION    . CARDIOVERSION N/A 06/13/2018   Procedure: CARDIOVERSION;  Surgeon: Jolaine Artist, MD;  Location: [facility] ENDOSCOPY;  Service: Cardiovascular;  Laterality: N/A;  . CORONARY STENT INTERVENTION N/A 07/27/2017   Procedure: CORONARY STENT INTERVENTION;   Surgeon: Martinique, Peter M, MD;  Location: [place] CV LAB;  Service: Cardiovascular;  Laterality: N/A;  . CORONARY/GRAFT ACUTE MI REVASCULARIZATION N/A 07/27/2017   Procedure: Coronary/Graft Acute MI Revascularization;  Surgeon: Martinique, Peter M, MD;  Location: [place] CV LAB;  Service: Cardiovascular;  Laterality: N/A;  . LEFT HEART CATH AND CORONARY ANGIOGRAPHY N/A 07/27/2017   Procedure: LEFT HEART CATH AND CORONARY ANGIOGRAPHY;  Surgeon: Martinique, Peter M, MD;  Location: [place] CV LAB;  Service: Cardiovascular;  Laterality: N/A;  . RE-EXCISION OF BREAST CANCER,SUPERIOR MARGINS Left 11/04/2014   Procedure: RE-EXCISION OF LEFT BREAST INFERIOR MARGINS;  Surgeon: Autumn Messing III, MD;  Location: [place];  Service: General;  Laterality: Left;  . RIGHT HEART CATH N/A 04/05/2020   Procedure: RIGHT HEART CATH;  Surgeon: Jolaine Artist, MD;  Location: [place] CV LAB;  Service: Cardiovascular;  Laterality: N/A;  . TEE WITHOUT CARDIOVERSION N/A 04/05/2020   Procedure: TRANSESOPHAGEAL ECHOCARDIOGRAM (TEE);  Surgeon: Jolaine Artist, MD;  Location: [facility] ENDOSCOPY;  Service: Cardiovascular;  Laterality: N/A;    There were no vitals filed for this visit.    Subjective Assessment - 05/09/20 1148    Subjective Patient reports that she has had shortness of breath with activity since 2019 when she had a stent placement.    Pertinent History anxiety, CAD, cardiac stent, breat cancer with radiation    Limitations Standing;Walking    Currently in Pain? No/denies  [facility] PT Assessment - 05/09/20 0001      Assessment   Medical Diagnosis Severe Mitral valve insuffciency     Referring Provider (PT) Dr Sherren Mocha     Onset Date/Surgical Date --   february 2019   Hand Dominance Right    Next MD Visit Thursday she will have the procedure     Prior Therapy None       Precautions   Precautions None      Restrictions   Weight Bearing Restrictions No      Balance Screen   Has  the patient fallen in the past 6 months No    Has the patient had a decrease in activity level because of a fear of falling?  No    Is the patient reluctant to leave their home because of a fear of falling?  No      Prior Function   Level of Independence Independent      Cognition   Overall Cognitive Status Within Functional Limits for tasks assessed    Attention Focused    Focused Attention Appears intact    Memory Appears intact    Awareness Appears intact    Problem Solving Appears intact    Executive Function Reasoning      Observation/Other Assessments   Focus on Therapeutic Outcomes (FOTO)  Mitral valve       Sensation   Light Touch Appears Intact    Additional Comments denies parathesias       Coordination   Gross Motor Movements are Fluid and Coordinated Yes    Fine Motor Movements are Fluid and Coordinated Yes      ROM / Strength   AROM / PROM / Strength PROM;AROM;Strength      AROM   Overall AROM Comments WNL UE and LE       Strength   Overall Strength Comments 5/5 gross UE/LE     Right/Left hand Right;Left    Right Hand Gross Grasp Impaired    Right Hand Grip (lbs) 45    Left Hand Grip (lbs) 40            [facility] Pre-Surgical Assessment - 05/09/20 0001    5 Meter Walk Test- trial 1 6 sec    5 Meter Walk Test- trial 2 6 sec.     5 Meter Walk Test- trial 3 5 sec.    5 meter walk test average 5.67 sec    4 Stage Balance Test tolerated for:  10 sec.    4 Stage Balance Test Position 4    comment able to maintian single leg stance    Sit To Stand Test- trial 1 5 sec.    Comment needed to use hands 31 seconds     ADL/IADL Independent with: Bathing;Dressing;Meal prep;Finances;Yard work    6 Minute Walk- Baseline yes    BP (mmHg) 106/87    HR (bpm) 89    02 Sat (%RA) 99 %    Modified Borg Scale for Dyspnea 1- Very mild shortness of breath    Perceived Rate of Exertion (Borg) 6-    6 Minute Walk Post Test yes    BP (mmHg) 131/80    HR (bpm) 107    02 Sat  (%RA) 97 %    Modified Borg Scale for Dyspnea 3- Moderate shortness of breath or breathing difficulty    Perceived Rate of Exertion (Borg) 9- very light    Endurance additional comments 4 laps seated rest break at  3:45.97 107  Pateitn able to stand and ambualte another lap and an extra 30'. Dsiablility 25%                     Objective measurements completed on examination: See above findings.                            Plan - 05/09/20 1229    Clinical Impression Statement see below    Stability/Clinical Decision Making Stable/Uncomplicated    Clinical Decision Making Low    Rehab Potential Good    PT Frequency One time visit    PT Treatment/Interventions Patient/family education    Consulted and Agree with Plan of Care Patient          Clinical Impression Statement: Pt is a 85 yo femal presenting to OP PT for evaluation prior to possible Mitraclip surgery due to severe mitral regurgitation. Pt reports onset of dyspnea with activity  approximately 24 months ago. Symptoms are limiting ability to ambualte and perfrom ADL's. Pt presents with normal ROM and strength, normal balance and is not at high fall risk 4 stage balance test, decreased walking speed and decreased aerobic endurance per 6 minute walk test. Pt ambulated 740 feet in 3:45  before requesting a seated rest beak lasting 23 seconds . At time of rest, patient's HR was 107 bpm and O2 was 97 on room air. Pt reported 3/10 shortness of breath on modified scale for dyspnea. Pt able to resume after rest and ambulate an additional 205 feet. Pt ambulated a total of 955 feet in 6 minute walk. B/P increased significantly with 6 minute walk test. Based on the Short Physical Performance Battery, patient has a frailty rating of 8/12 with </= 5/12 considered frail.    Patient will benefit from skilled therapeutic intervention in order to improve the following deficits and impairments:     Visit  Diagnosis: Other abnormalities of gait and mobility     Problem List Patient Active Problem List   Diagnosis Date Noted  . Mitral regurgitation 04/05/2020  . Chronic idiopathic constipation 12/05/2017  . Asymptomatic microscopic hematuria 12/05/2017  . Unintentional weight loss 12/05/2017  . Chronic kidney disease, stage IV (severe) ([place]) 12/05/2017  . Anemia associated with chronic renal failure 12/05/2017  . Hypercalcemia 12/05/2017  . Acute systolic heart failure ([place])   . A-fib ([place])   . Pressure injury of skin 07/28/2017  . Acute ST elevation myocardial infarction (STEMI) of anterolateral wall ([place]) 07/27/2017  . Osteoporosis 10/20/2014  . Malignant neoplasm of lower-outer quadrant of left breast of female, estrogen receptor positive ([place]) 10/11/2014  . Pure hypercholesterolemia 04/08/2014  . Anxiety   . PUD (peptic ulcer disease)   . HTN (hypertension), benign 06/29/2011  . GERD (gastroesophageal reflux disease) 06/29/2011    Carney Living 05/09/2020, 12:31 PM  [facility] [address] [place], [place], [address] Phone: [phone]   Fax:  [phone]  Name: CASSADEE VANZANDT MRN: [id number] Date of Birth: January 14, 1935

## 2020-05-10 ENCOUNTER — Other Ambulatory Visit (HOSPITAL_COMMUNITY)
Admission: RE | Admit: 2020-05-10 | Discharge: 2020-05-10 | Disposition: A | Discharge status: Home / Self Care | Payer: Medicare Other | Source: Ambulatory Visit | Attending: Cardiovascular Disease | Admitting: Cardiovascular Disease

## 2020-05-10 ENCOUNTER — Encounter (HOSPITAL_COMMUNITY): Payer: Self-pay

## 2020-05-10 DIAGNOSIS — Z20822 Contact with and (suspected) exposure to covid-19: Secondary | ICD-10-CM | POA: Insufficient documentation

## 2020-05-10 DIAGNOSIS — Z01812 Encounter for preprocedural laboratory examination: Secondary | ICD-10-CM | POA: Insufficient documentation

## 2020-05-10 LAB — SARS CORONAVIRUS 2 (TAT 6-24 HRS): SARS Coronavirus 2: NEGATIVE

## 2020-05-10 NOTE — Progress Notes (Signed)
Anesthesia Chart Review:  Case: [id number] Date/Time: 05/12/20 1200   Procedures:      MITRAL VALVE REPAIR (N/A )     TRANSESOPHAGEAL ECHOCARDIOGRAM (TEE) (N/A )   Anesthesia type: General   Pre-op diagnosis: Severe Mitral Valve Insufficiency   Location: [facility] CATH LAB 6 / [place] INVASIVE CV LAB   Providers: Sherren Mocha, MD      DISCUSSION: Patient is an 85 year old female scheduled for the above procedure.  History includes never smoker, post-operative N/V (2016, now denies), HTN, CAD (anterolateral STEMI s/p emergent DES x2 to Ramus Int, complicated by post-procedure acute respiratory failure requiring intubation, bradycardic arrest requiring atropine/epi and brief CPR, afib with RVR, cardiogenic shock), ischemic cardiomyopathy (11/5991), systolic CHF (diagnosed 10/7015 in setting of STEMI/ischemic CM), afib (07/2017, recurrent s/p DCCV 06/13/18 with brief success), severe mitral regurgitation, hypercholesterolemia, breast cancer (s/p left breast lumpectomy 10/2014, s/p radiation), CKD (stage III), GERD, anxiety, anemia, osteoporosis.   Her husband died in late 03/23/20.   Last dose of Eliquis 05/09/2020 per cardiology instructions.  05/10/2020 presurgical COVID-19 test in process.  Anesthesia team to evaluate on the day of surgery.   VS: BP 133/82   Pulse 83   Temp 36.6 C (Oral)   Resp 18   Ht 5\' 8"  (1.727 m)   Wt 61.6 kg   SpO2 100%   BMI 20.66 kg/m     PROVIDERS: Forrest Moron, MD is PCP  Glori Bickers, MD is primary/HF cardiologist Magrinat, Sarajane Jews, MD is HEM-ONC   LABS: Labs reviewed: Acceptable for surgery. Cr 1.72, but previously 1.62-2.52 since 10/2018 and primarily ~ 1.70 range. H/H 10.1/31.5 but similar/improved from labs over the past year. (all labs ordered are listed, but only abnormal results are displayed)  Labs Reviewed  APTT - Abnormal; Notable for the following components:      Result Value   aPTT 41 (*)    All other components within normal limits   BLOOD GAS, ARTERIAL - Abnormal; Notable for the following components:   pO2, Arterial 122 (*)    Bicarbonate 19.0 (*)    Acid-base deficit 5.1 (*)    All other components within normal limits  BRAIN NATRIURETIC PEPTIDE - Abnormal; Notable for the following components:   B Natriuretic Peptide 500.8 (*)    All other components within normal limits  CBC - Abnormal; Notable for the following components:   RBC 3.19 (*)    Hemoglobin 10.1 (*)    HCT 31.5 (*)    All other components within normal limits  COMPREHENSIVE METABOLIC PANEL - Abnormal; Notable for the following components:   CO2 18 (*)    Glucose, Bld 100 (*)    BUN 46 (*)    Creatinine, Ser 1.72 (*)    Calcium 11.0 (*)    Albumin 3.4 (*)    GFR, Estimated 29 (*)    All other components within normal limits  HEMOGLOBIN A1C - Abnormal; Notable for the following components:   Hgb A1c MFr Bld 6.1 (*)    All other components within normal limits  PROTIME-INR - Abnormal; Notable for the following components:   Prothrombin Time 16.9 (*)    INR 1.4 (*)    All other components within normal limits  SURGICAL PCR SCREEN  URINALYSIS, ROUTINE W REFLEX MICROSCOPIC  TYPE AND SCREEN     IMAGES: CXR 05/09/20: FINDINGS: - Enlargement of cardiac silhouette with coronary stents noted. - Mediastinal contours and pulmonary vascularity normal. - Atherosclerotic calcification aorta. - Emphysematous  changes without pulmonary infiltrate, pleural effusion, or pneumothorax. - Large calcification within the superior liver again seen. - Prior LEFT breast surgery. - Osseous demineralization with thoracic kyphosis and minimal chronic anterior height loss of a midthoracic vertebra. IMPRESSION: Enlargement of cardiac silhouette. Emphysematous changes without acute infiltrate.   EKG: 05/09/20: Atrial fibrillation Minimal voltage criteria for LVH, may be normal variant ( Cornell product ) Septal infarct , age undetermined ST & Incomplete left  bundle branch block Abnormal ECG No significant change since last tracing Confirmed by End, Harrell Gave [phone]) on 05/09/2020 8:47:36 PM   CV: TEE 04/05/20: FINDINGS: - LEFT VENTRICLE: EF = 55%. No regional wall motion abnormalities. - RIGHT VENTRICLE: Normal size and function.  - LEFT ATRIUM:Severely dilated - LEFT ATRIAL APPENDAGE: No thrombus.  - RIGHT ATRIUM:Mildly dilated - AORTIC VALVE: Trileaflet. Mild calcification mild AI - MITRAL VALVE: Normal. Mild calcification.Dilated mitral annulus from severe LA dilation.Mildrestriction of posteriorleaflet. Severe (4+) MRlarge central and posterior componentswith flow reversal in pulmonary veins. - TRICUSPID VALVE: Normal. Mild prolapse. Moderate to severe TR - PULMONIC VALVE: Grossly normal.Mild PI - INTERATRIAL SEPTUM:Small PFO with left to right shunting - PERICARDIUM: No effusion - DESCENDING AORTA:Severe plaque   RHC 04/05/20: Findings:  RA = 1 RV = 39/1 PA = 39/12 (24) PCW = 21 (v=39) Fick cardiac output/index = 5.3/3.1 PVR = 0.6 WU FA sat = 99% PA sat = 65%, 68%  Assessment: 1. Relatively well-compensated hemodynamics with prominent v-waves in PCPW tracing corresponding to patient's severe MR  Plan/Discussion: Consider MitraClip.    Long term event monitor 02/02/20-02/15/20:  Study Highlights 1.  Atrial Fibrillation occurred continuously (100% burden), ranging from 38-173 bpm (avg of 84 bpm). 2. Two runs nonsustainec Ventricular Tachycardia occurred, the longest lasting 8 beats with an avg rate of 110 bpm.  3. One pause occurred lasting 3 secs (20 bpm). 4. Isolated PVCs were rare 5. Ventricular Bigeminy and Trigeminy were present.    LHC 07/27/17 (in setting of CODE STEMI):  Post Atrio lesion is 90% stenosed.  Prox LAD to Dist LAD lesion is 50% stenosed.  Ost 1st Diag lesion is 95% stenosed.  Ost 2nd Diag to 2nd Diag lesion is 90% stenosed.  Mid Cx lesion is 30% stenosed.  Ost Ramus to  Ramus lesion is 100% stenosed.  A drug-eluting stent was successfully placed using a STENT SYNERGY DES 2.75X28.  A drug-eluting stent was successfully placed using a STENT SYNERGY DES 2.5X12.  Post intervention, there is a 0% residual stenosis.  There is moderate to severe left ventricular systolic dysfunction.  LV end diastolic pressure is moderately elevated.  The left ventricular ejection fraction is 35-45% by visual estimate.   1. 3 vessel obstructive CAD    - 100% ramus intermediate artery. This is a large vessel and is the culprit lesion.    - 95% small first diagonal    - 90% small second diagonal     - diffusely diseased LAD    - 90% PLOM 2. Moderate to severe LV dysfunction. EF estimated at 35-40%. 3. Elevated LVEDP 4. Successful stenting of the ramus intermediate artery with DES x 2.   Plan; DAPT for one year. I would treat her residual disease medically.    Past Medical History:  Diagnosis Date  . Anemia   . Anxiety   . Atrial fibrillation ([place])   . Breast cancer of lower-outer quadrant of left female breast ([place]) 10/11/2014   treated with lumpectomy and radiation  . Chronic systolic CHF (  congestive heart failure) ([place])   . CKD (chronic kidney disease)   . Coronary artery disease 07/27/2017  . GERD (gastroesophageal reflux disease)   . Hypertension   . Mitral regurgitation   . Myocardial infarction ([place]) 07/27/2017   STEMI of anterolateral wall. Cath w/ stent and cabg by Dr. Martinique  . Osteoporosis 10/20/2014  . Personal history of radiation therapy 2016   treatment of breast cancer  . PONV (postoperative nausea and vomiting)    pt denies this.  . PUD (peptic ulcer disease)   . Pure hypercholesterolemia 04/08/2014  . Stone, kidney     Past Surgical History:  Procedure Laterality Date  . BREAST LUMPECTOMY Left 2016  . BREAST LUMPECTOMY WITH SENTINEL LYMPH NODE BIOPSY Left 10/22/2014   Procedure: BREAST LUMPECTOMY WITH SENTINEL LYMPH NODE BIOPSY;  Surgeon:  Autumn Messing III, MD;  Location: [place];  Service: General;  Laterality: Left;  . CARDIAC CATHETERIZATION    . CARDIOVERSION N/A 06/13/2018   Procedure: CARDIOVERSION;  Surgeon: Jolaine Artist, MD;  Location: [facility] ENDOSCOPY;  Service: Cardiovascular;  Laterality: N/A;  . CORONARY STENT INTERVENTION N/A 07/27/2017   Procedure: CORONARY STENT INTERVENTION;  Surgeon: Martinique, Peter M, MD;  Location: [place] CV LAB;  Service: Cardiovascular;  Laterality: N/A;  . CORONARY/GRAFT ACUTE MI REVASCULARIZATION N/A 07/27/2017   Procedure: Coronary/Graft Acute MI Revascularization;  Surgeon: Martinique, Peter M, MD;  Location: [place] CV LAB;  Service: Cardiovascular;  Laterality: N/A;  . LEFT HEART CATH AND CORONARY ANGIOGRAPHY N/A 07/27/2017   Procedure: LEFT HEART CATH AND CORONARY ANGIOGRAPHY;  Surgeon: Martinique, Peter M, MD;  Location: [place] CV LAB;  Service: Cardiovascular;  Laterality: N/A;  . RE-EXCISION OF BREAST CANCER,SUPERIOR MARGINS Left 11/04/2014   Procedure: RE-EXCISION OF LEFT BREAST INFERIOR MARGINS;  Surgeon: Autumn Messing III, MD;  Location: [place];  Service: General;  Laterality: Left;  . RIGHT HEART CATH N/A 04/05/2020   Procedure: RIGHT HEART CATH;  Surgeon: Jolaine Artist, MD;  Location: [place] CV LAB;  Service: Cardiovascular;  Laterality: N/A;  . TEE WITHOUT CARDIOVERSION N/A 04/05/2020   Procedure: TRANSESOPHAGEAL ECHOCARDIOGRAM (TEE);  Surgeon: Jolaine Artist, MD;  Location: [facility] ENDOSCOPY;  Service: Cardiovascular;  Laterality: N/A;    MEDICATIONS: . acetaminophen (TYLENOL) 500 MG tablet  . anastrozole (ARIMIDEX) 1 MG tablet  . apixaban (ELIQUIS) 2.5 MG TABS tablet  . atorvastatin (LIPITOR) 80 MG tablet  . B-12, Methylcobalamin, 1000 MCG SUBL  . Cholecalciferol (VITAMIN D-3) 125 MCG (5000 UT) TABS  . co-enzyme Q-10 30 MG capsule  . furosemide (LASIX) 20 MG tablet  . losartan (COZAAR) 25 MG tablet  . Multiple Vitamin (MULTIVITAMIN WITH MINERALS)  TABS tablet  . nitroGLYCERIN (NITROSTAT) 0.3 MG SL tablet  . Omega-3 Fatty Acids (FISH OIL PO)  . spironolactone (ALDACTONE) 25 MG tablet  . Ubiquinol 100 MG CAPS   No current facility-administered medications for this encounter.    Myra Gianotti, PA-C Surgical Short Stay/Anesthesiology [facility] Phone [phone] [facility] Phone [phone] 05/10/2020 4:09 PM

## 2020-05-12 ENCOUNTER — Other Ambulatory Visit: Payer: Self-pay | Admitting: Physician Assistant

## 2020-05-12 ENCOUNTER — Inpatient Hospital Stay (HOSPITAL_COMMUNITY): Payer: Medicare Other | Admitting: Vascular Surgery

## 2020-05-12 ENCOUNTER — Inpatient Hospital Stay (HOSPITAL_COMMUNITY)
Admission: RE | Admit: 2020-05-12 | Discharge: 2020-05-13 | DRG: 267 | Disposition: A | Discharge status: Home / Self Care | Payer: Medicare Other | Attending: Cardiovascular Disease | Admitting: Cardiovascular Disease

## 2020-05-12 ENCOUNTER — Encounter (HOSPITAL_COMMUNITY)
Admission: RE | Disposition: A | Discharge status: Home / Self Care | Payer: Self-pay | Source: Home / Self Care | Attending: Cardiovascular Disease

## 2020-05-12 ENCOUNTER — Other Ambulatory Visit: Payer: Self-pay

## 2020-05-12 ENCOUNTER — Inpatient Hospital Stay (HOSPITAL_COMMUNITY): Payer: Medicare Other

## 2020-05-12 ENCOUNTER — Inpatient Hospital Stay (HOSPITAL_COMMUNITY): Payer: Medicare Other | Admitting: Anesthesiology

## 2020-05-12 ENCOUNTER — Encounter (HOSPITAL_COMMUNITY): Payer: Self-pay | Admitting: Cardiovascular Disease

## 2020-05-12 DIAGNOSIS — I10 Essential (primary) hypertension: Secondary | ICD-10-CM | POA: Diagnosis present

## 2020-05-12 DIAGNOSIS — N189 Chronic kidney disease, unspecified: Secondary | ICD-10-CM | POA: Diagnosis present

## 2020-05-12 DIAGNOSIS — Z87442 Personal history of urinary calculi: Secondary | ICD-10-CM

## 2020-05-12 DIAGNOSIS — Z951 Presence of aortocoronary bypass graft: Secondary | ICD-10-CM | POA: Diagnosis not present

## 2020-05-12 DIAGNOSIS — I252 Old myocardial infarction: Secondary | ICD-10-CM

## 2020-05-12 DIAGNOSIS — I130 Hypertensive heart and chronic kidney disease with heart failure and stage 1 through stage 4 chronic kidney disease, or unspecified chronic kidney disease: Secondary | ICD-10-CM | POA: Diagnosis present

## 2020-05-12 DIAGNOSIS — N184 Chronic kidney disease, stage 4 (severe): Secondary | ICD-10-CM | POA: Diagnosis present

## 2020-05-12 DIAGNOSIS — Z95818 Presence of other cardiac implants and grafts: Secondary | ICD-10-CM

## 2020-05-12 DIAGNOSIS — I5022 Chronic systolic (congestive) heart failure: Secondary | ICD-10-CM

## 2020-05-12 DIAGNOSIS — Z853 Personal history of malignant neoplasm of breast: Secondary | ICD-10-CM | POA: Diagnosis not present

## 2020-05-12 DIAGNOSIS — Z923 Personal history of irradiation: Secondary | ICD-10-CM

## 2020-05-12 DIAGNOSIS — D631 Anemia in chronic kidney disease: Secondary | ICD-10-CM | POA: Diagnosis present

## 2020-05-12 DIAGNOSIS — Z8711 Personal history of peptic ulcer disease: Secondary | ICD-10-CM

## 2020-05-12 DIAGNOSIS — Z006 Encounter for examination for normal comparison and control in clinical research program: Secondary | ICD-10-CM

## 2020-05-12 DIAGNOSIS — Z955 Presence of coronary angioplasty implant and graft: Secondary | ICD-10-CM

## 2020-05-12 DIAGNOSIS — I351 Nonrheumatic aortic (valve) insufficiency: Secondary | ICD-10-CM | POA: Diagnosis not present

## 2020-05-12 DIAGNOSIS — I4891 Unspecified atrial fibrillation: Secondary | ICD-10-CM | POA: Diagnosis present

## 2020-05-12 DIAGNOSIS — Z9889 Other specified postprocedural states: Secondary | ICD-10-CM

## 2020-05-12 DIAGNOSIS — Z8249 Family history of ischemic heart disease and other diseases of the circulatory system: Secondary | ICD-10-CM

## 2020-05-12 DIAGNOSIS — Z954 Presence of other heart-valve replacement: Secondary | ICD-10-CM | POA: Diagnosis not present

## 2020-05-12 DIAGNOSIS — Z7901 Long term (current) use of anticoagulants: Secondary | ICD-10-CM

## 2020-05-12 DIAGNOSIS — I340 Nonrheumatic mitral (valve) insufficiency: Secondary | ICD-10-CM

## 2020-05-12 DIAGNOSIS — K219 Gastro-esophageal reflux disease without esophagitis: Secondary | ICD-10-CM | POA: Diagnosis present

## 2020-05-12 DIAGNOSIS — I4819 Other persistent atrial fibrillation: Secondary | ICD-10-CM | POA: Diagnosis present

## 2020-05-12 DIAGNOSIS — F419 Anxiety disorder, unspecified: Secondary | ICD-10-CM | POA: Diagnosis present

## 2020-05-12 DIAGNOSIS — M810 Age-related osteoporosis without current pathological fracture: Secondary | ICD-10-CM | POA: Diagnosis present

## 2020-05-12 DIAGNOSIS — E785 Hyperlipidemia, unspecified: Secondary | ICD-10-CM | POA: Diagnosis present

## 2020-05-12 DIAGNOSIS — C50512 Malignant neoplasm of lower-outer quadrant of left female breast: Secondary | ICD-10-CM

## 2020-05-12 DIAGNOSIS — K279 Peptic ulcer, site unspecified, unspecified as acute or chronic, without hemorrhage or perforation: Secondary | ICD-10-CM | POA: Diagnosis present

## 2020-05-12 DIAGNOSIS — I2510 Atherosclerotic heart disease of native coronary artery without angina pectoris: Secondary | ICD-10-CM | POA: Diagnosis present

## 2020-05-12 DIAGNOSIS — Z20822 Contact with and (suspected) exposure to covid-19: Secondary | ICD-10-CM | POA: Diagnosis present

## 2020-05-12 DIAGNOSIS — E7800 Pure hypercholesterolemia, unspecified: Secondary | ICD-10-CM | POA: Diagnosis present

## 2020-05-12 DIAGNOSIS — I361 Nonrheumatic tricuspid (valve) insufficiency: Secondary | ICD-10-CM | POA: Diagnosis not present

## 2020-05-12 HISTORY — PX: MITRAL VALVE REPAIR: CATH118311

## 2020-05-12 HISTORY — DX: Other specified postprocedural states: Z98.890

## 2020-05-12 HISTORY — PX: TEE WITHOUT CARDIOVERSION: SHX5443

## 2020-05-12 LAB — ECHO TEE
MV M vel: 5.32 m/s
MV Peak grad: 113.2 mmHg
Radius: 1.1 cm

## 2020-05-12 LAB — POCT ACTIVATED CLOTTING TIME
Activated Clotting Time: 231 seconds
Activated Clotting Time: 273 seconds
Activated Clotting Time: 285 seconds

## 2020-05-12 SURGERY — MITRAL VALVE REPAIR
Anesthesia: General

## 2020-05-12 MED ORDER — METOPROLOL TARTRATE 5 MG/5ML IV SOLN
INTRAVENOUS | Status: DC | PRN
  Administered 2020-05-12: 5 mg via INTRAVENOUS

## 2020-05-12 MED ORDER — CALCIUM CHLORIDE 10 % IV SOLN
INTRAVENOUS | Status: DC | PRN
  Administered 2020-05-12: 300 mg via INTRAVENOUS

## 2020-05-12 MED ORDER — EPHEDRINE SULFATE-NACL 50-0.9 MG/10ML-% IV SOSY
PREFILLED_SYRINGE | INTRAVENOUS | Status: DC | PRN
  Administered 2020-05-12: 5 mg via INTRAVENOUS

## 2020-05-12 MED ORDER — ONDANSETRON HCL 4 MG/2ML IJ SOLN
INTRAMUSCULAR | Status: DC | PRN
  Administered 2020-05-12: 4 mg via INTRAVENOUS

## 2020-05-12 MED ORDER — SODIUM CHLORIDE 0.9 % IV SOLN
1.5000 g | INTRAVENOUS | Status: AC
  Administered 2020-05-12: 1.5 g via INTRAVENOUS
  Filled 2020-05-12: qty 1.5

## 2020-05-12 MED ORDER — NITROGLYCERIN 0.4 MG SL SUBL: 0.4000 mg | SUBLINGUAL_TABLET | SUBLINGUAL | Status: DC | PRN

## 2020-05-12 MED ORDER — ADULT MULTIVITAMIN W/MINERALS CH
1.0000 | ORAL_TABLET | Freq: Every day | ORAL | Status: DC
  Administered 2020-05-12 – 2020-05-13 (×2): 1 via ORAL
  Filled 2020-05-12 (×2): qty 1

## 2020-05-12 MED ORDER — CHLORHEXIDINE GLUCONATE 0.12 % MT SOLN: 15.0000 mL | Freq: Once | OROMUCOSAL | Status: AC

## 2020-05-12 MED ORDER — LACTATED RINGERS IV SOLN: INTRAVENOUS | Status: DC

## 2020-05-12 MED ORDER — LACTATED RINGERS IV SOLN: INTRAVENOUS | Status: DC | PRN

## 2020-05-12 MED ORDER — LABETALOL HCL 5 MG/ML IV SOLN
INTRAVENOUS | Status: AC
  Filled 2020-05-12: qty 4

## 2020-05-12 MED ORDER — SUGAMMADEX SODIUM 200 MG/2ML IV SOLN
INTRAVENOUS | Status: DC | PRN
  Administered 2020-05-12: 150 mg via INTRAVENOUS

## 2020-05-12 MED ORDER — CHLORHEXIDINE GLUCONATE 0.12 % MT SOLN
OROMUCOSAL | Status: AC
  Administered 2020-05-12: 15 mL via OROMUCOSAL
  Filled 2020-05-12: qty 15

## 2020-05-12 MED ORDER — ASPIRIN 81 MG PO CHEW
81.0000 mg | CHEWABLE_TABLET | Freq: Every day | ORAL | Status: DC
  Administered 2020-05-12 – 2020-05-13 (×2): 81 mg via ORAL
  Filled 2020-05-12 (×2): qty 1

## 2020-05-12 MED ORDER — CHLORHEXIDINE GLUCONATE 4 % EX LIQD: 30.0000 mL | CUTANEOUS | Status: DC

## 2020-05-12 MED ORDER — ANASTROZOLE 1 MG PO TABS
1.0000 mg | ORAL_TABLET | Freq: Every day | ORAL | Status: DC
  Administered 2020-05-13: 1 mg via ORAL
  Filled 2020-05-12: qty 1

## 2020-05-12 MED ORDER — PHENYLEPHRINE HCL-NACL 10-0.9 MG/250ML-% IV SOLN
INTRAVENOUS | Status: DC | PRN
  Administered 2020-05-12: 25 ug/min via INTRAVENOUS

## 2020-05-12 MED ORDER — METOPROLOL TARTRATE 5 MG/5ML IV SOLN
INTRAVENOUS | Status: AC
  Filled 2020-05-12: qty 5

## 2020-05-12 MED ORDER — LIDOCAINE HCL (PF) 1 % IJ SOLN
INTRAMUSCULAR | Status: DC | PRN
  Administered 2020-05-12: 22 mL

## 2020-05-12 MED ORDER — FENTANYL CITRATE (PF) 250 MCG/5ML IJ SOLN
INTRAMUSCULAR | Status: DC | PRN
  Administered 2020-05-12 (×2): 25 ug via INTRAVENOUS

## 2020-05-12 MED ORDER — ATORVASTATIN CALCIUM 80 MG PO TABS
80.0000 mg | ORAL_TABLET | Freq: Every day | ORAL | Status: DC
  Administered 2020-05-12 – 2020-05-13 (×2): 80 mg via ORAL
  Filled 2020-05-12 (×2): qty 1

## 2020-05-12 MED ORDER — ROCURONIUM BROMIDE 10 MG/ML (PF) SYRINGE
PREFILLED_SYRINGE | INTRAVENOUS | Status: DC | PRN
  Administered 2020-05-12: 50 mg via INTRAVENOUS
  Administered 2020-05-12: 20 mg via INTRAVENOUS

## 2020-05-12 MED ORDER — VANCOMYCIN HCL 1250 MG/250ML IV SOLN
1250.0000 mg | INTRAVENOUS | Status: AC
  Administered 2020-05-12: 1250 mg via INTRAVENOUS
  Filled 2020-05-12: qty 250

## 2020-05-12 MED ORDER — PROPOFOL 10 MG/ML IV BOLUS
INTRAVENOUS | Status: DC | PRN
  Administered 2020-05-12: 70 mg via INTRAVENOUS

## 2020-05-12 MED ORDER — HEPARIN (PORCINE) IN NACL 1000-0.9 UT/500ML-% IV SOLN
INTRAVENOUS | Status: DC | PRN
  Administered 2020-05-12: 500 mL

## 2020-05-12 MED ORDER — SPIRONOLACTONE 25 MG PO TABS
25.0000 mg | ORAL_TABLET | Freq: Every day | ORAL | Status: DC
  Administered 2020-05-13: 25 mg via ORAL
  Filled 2020-05-12: qty 1

## 2020-05-12 MED ORDER — CHLORHEXIDINE GLUCONATE 4 % EX LIQD: 60.0000 mL | Freq: Once | CUTANEOUS | Status: DC

## 2020-05-12 MED ORDER — DEXAMETHASONE SODIUM PHOSPHATE 10 MG/ML IJ SOLN
INTRAMUSCULAR | Status: DC | PRN
  Administered 2020-05-12: 10 mg via INTRAVENOUS

## 2020-05-12 MED ORDER — ACETAMINOPHEN 500 MG PO TABS
500.0000 mg | ORAL_TABLET | Freq: Four times a day (QID) | ORAL | Status: DC | PRN
  Administered 2020-05-12: 1000 mg via ORAL
  Filled 2020-05-12 (×2): qty 1

## 2020-05-12 MED ORDER — HEPARIN SODIUM (PORCINE) 1000 UNIT/ML IJ SOLN
INTRAMUSCULAR | Status: DC | PRN
  Administered 2020-05-12: 4000 [IU] via INTRAVENOUS
  Administered 2020-05-12: 7000 [IU] via INTRAVENOUS
  Administered 2020-05-12: 2000 [IU] via INTRAVENOUS

## 2020-05-12 MED ORDER — HEPARIN (PORCINE) IN NACL 2000-0.9 UNIT/L-% IV SOLN
INTRAVENOUS | Status: DC | PRN
  Administered 2020-05-12 (×3): 1000 mL

## 2020-05-12 MED ORDER — SODIUM CHLORIDE 0.9 % IV SOLN: INTRAVENOUS | Status: DC

## 2020-05-12 MED ORDER — APIXABAN 2.5 MG PO TABS
2.5000 mg | ORAL_TABLET | Freq: Two times a day (BID) | ORAL | Status: DC
  Administered 2020-05-13: 2.5 mg via ORAL
  Filled 2020-05-12: qty 1

## 2020-05-12 MED ORDER — SODIUM CHLORIDE 0.9 % IV SOLN: 250.0000 mL | INTRAVENOUS | Status: DC | PRN

## 2020-05-12 MED ORDER — ONDANSETRON HCL 4 MG/2ML IJ SOLN: 4.0000 mg | Freq: Four times a day (QID) | INTRAMUSCULAR | Status: DC | PRN

## 2020-05-12 MED ORDER — SODIUM CHLORIDE 0.9% FLUSH: 3.0000 mL | INTRAVENOUS | Status: DC | PRN

## 2020-05-12 MED ORDER — SODIUM CHLORIDE 0.9% FLUSH
3.0000 mL | Freq: Two times a day (BID) | INTRAVENOUS | Status: DC
  Administered 2020-05-12 – 2020-05-13 (×2): 3 mL via INTRAVENOUS

## 2020-05-12 MED ORDER — CHLORHEXIDINE GLUCONATE CLOTH 2 % EX PADS
6.0000 | MEDICATED_PAD | Freq: Every day | CUTANEOUS | Status: DC
  Administered 2020-05-13: 6 via TOPICAL

## 2020-05-12 MED ORDER — PROTAMINE SULFATE 10 MG/ML IV SOLN
INTRAVENOUS | Status: DC | PRN
  Administered 2020-05-12: 50 mg via INTRAVENOUS

## 2020-05-12 SURGICAL SUPPLY — 16 items
BLANKET WARM UNDERBOD FULL ACC (MISCELLANEOUS) ×2 IMPLANT
CATH MITRA STEERABLE GUIDE (CATHETERS) ×4 IMPLANT
CLIP MITRA G4 DELIVERY SYS NTW (Clip) ×4 IMPLANT
CLOSURE PERCLOSE PROSTYLE (VASCULAR PRODUCTS) ×4 IMPLANT
ELECT DEFIB PAD ADLT CADENCE (PAD) ×2 IMPLANT
KIT DILATOR VASC 18G NDL (KITS) ×2 IMPLANT
KIT HEART LEFT (KITS) ×6 IMPLANT
PACK CARDIAC CATHETERIZATION (CUSTOM PROCEDURE TRAY) ×3 IMPLANT
SHEATH PINNACLE 8F 10CM (SHEATH) ×2 IMPLANT
SHEATH PROBE COVER 6X72 (BAG) ×3 IMPLANT
SHIELD RADPAD SCOOP 12X17 (MISCELLANEOUS) ×2 IMPLANT
STOPCOCK MORSE 400PSI 3WAY (MISCELLANEOUS) ×18 IMPLANT
SYSTEM MITRACLIP G4 (SYSTAGENIX WOUND MANAGEMENT) ×2 IMPLANT
TRANSDUCER W/STOPCOCK (MISCELLANEOUS) ×3 IMPLANT
TUBING ART PRESS 72  MALE/FEM (TUBING) ×6
TUBING ART PRESS 72 MALE/FEM (TUBING) ×1 IMPLANT

## 2020-05-12 NOTE — Progress Notes (Signed)
Echocardiogram Echocardiogram Transesophageal has been performed as part of the MitraClip procedure.  [name] 05/12/2020, 2:17 PM

## 2020-05-12 NOTE — Progress Notes (Signed)
Rt radial arterial line d/c'ed; manual pressure x15 minutes; level 0; palpable rt radial pulse 3+; dressed w/gauze and tegaderm

## 2020-05-12 NOTE — Anesthesia Postprocedure Evaluation (Signed)
Anesthesia Post Note  Patient: Rosey Bath  Procedure(s) Performed: MITRAL VALVE REPAIR (N/A ) TRANSESOPHAGEAL ECHOCARDIOGRAM (TEE) (N/A )     Patient location during evaluation: PACU Anesthesia Type: General Level of consciousness: awake and alert Pain management: pain level controlled Vital Signs Assessment: post-procedure vital signs reviewed and stable Respiratory status: spontaneous breathing, nonlabored ventilation, respiratory function stable and patient connected to nasal cannula oxygen Cardiovascular status: blood pressure returned to baseline and stable Postop Assessment: no apparent nausea or vomiting Anesthetic complications: no   No complications documented.  Last Vitals:  Vitals:   05/12/20 1545 05/12/20 1700  BP: 99/83 129/76  Pulse: 85 90  Resp: 18 (!) 21  Temp: (!) 36.4 C   SpO2: 99% 100%    Last Pain:  Vitals:   05/12/20 1700  TempSrc:   PainSc: 0-No pain                 Effie Berkshire

## 2020-05-12 NOTE — Transfer of Care (Signed)
Immediate Anesthesia Transfer of Care Note  Patient: [name]  Procedure(s) Performed: MITRAL VALVE REPAIR (N/A ) TRANSESOPHAGEAL ECHOCARDIOGRAM (TEE) (N/A )  Patient Location: Cath Lab  Anesthesia Type:General  Level of Consciousness: awake, alert  and patient cooperative  Airway & Oxygen Therapy: Patient Spontanous Breathing and Patient connected to face mask oxygen  Post-op Assessment: Report given to RN, Post -op Vital signs reviewed and stable, Patient moving all extremities and Patient moving all extremities X 4  Post vital signs: Reviewed and stable  Last Vitals:  Vitals Value Taken Time  BP 126/74 05/12/20 1427  Temp 36.4 C 05/12/20 1420  Pulse 73 05/12/20 1429  Resp 14 05/12/20 1429  SpO2 99 % 05/12/20 1429  Vitals shown include unvalidated device data.  Last Pain:  Vitals:   05/12/20 1420  TempSrc: Temporal  PainSc: 0-No pain      Patients Stated Pain Goal: 3 (91/50/56 9794)  Complications: No complications documented.

## 2020-05-12 NOTE — Discharge Instructions (Signed)
Home Care Following Your MitraClip Procedure      If you have any questions or concerns you can call the structural heart office at [phone] during normal business hours 8am-4pm. If you have an urgent need after hours or on the weekend, please call [phone] to talk to the on call provider for general cardiology. If you have an emergency that requires immediate attention, please call 911.   Groin Site Care Refer to this sheet in the next few weeks. These instructions provide you with information on caring for yourself after your procedure. Your caregiver may also give you more specific instructions. Your treatment has been planned according to current medical practices, but problems sometimes occur. Call your caregiver if you have any problems or questions after your procedure. HOME CARE INSTRUCTIONS  You may shower 24 hours after the procedure. Remove the bandage (dressing) and gently wash the site with plain soap and water. Gently pat the site dry.   Do not apply powder or lotion to the site.   Do not sit in a bathtub, swimming pool, or whirlpool for 5 to 7 days.   No bending, squatting, or lifting anything over 10 pounds (4.5 kg) as directed by your caregiver.   Inspect the site at least twice daily.   Do not drive home if you are discharged the same day of the procedure. Have someone else drive you.   You may drive 72 hours after the procedure unless otherwise instructed by your caregiver.  What to expect:  Any bruising will usually fade within 1 to 2 weeks.   Blood that collects in the tissue (hematoma) may be painful to the touch. It should usually decrease in size and tenderness within 1 to 2 weeks.  SEEK IMMEDIATE MEDICAL CARE IF:  You have unusual pain at the groin site or down the affected leg.   You have redness, warmth, swelling, or pain at the groin site.   You have drainage (other than a small amount of blood on the dressing).   You have chills.   You have a  fever or persistent symptoms for more than 72 hours.   You have a fever and your symptoms suddenly get worse.   Your leg becomes pale, cool, tingly, or numb.   You have bleeding from the site. Hold pressure on the site until it subsides.    After MitraClip Checklist  Check  Test Description   Follow up appointment in 1-2 weeks  Most of our patients will see our structural heart physician assistant, Katie Eliodoro Gullett, or your primary cardiologist within 1-2 weeks. Your incision site will be checked and you will be cleared to resume all normal activities if you are doing well.     1 month echo and follow up  You will have an echo to check on your heart valve clip and be seen back in the office by Katie Rhianna Raulerson PA-C.   Follow up with your primary cardiologist You will need to be seen by your primary cardiologist in the following 3-6 months after your 1 month appointment in the valve clinic. Often times your Plavix or Aspirin will be discontinued during this time, but this is decided on a case by case basis.    1 year echo and follow up You will have another echo to check on your heart valve after one year and be seen back in the office by Katie Sahith Nurse. This your last structural heart visit.   Bacterial endocarditis prophylaxis  You will   have to take antibiotics for the rest of your life before all dental procedures (even dental cleanings) to protect your heart valve from potential infection. Antibiotics are also required before some surgeries. Please check with your cardiologist before scheduling any surgeries. Also, please make sure to tell us if you have a penicillin allergy as you will require an alternative antibiotic.    ______________  Your Implant Identification Card Following your procedure, you will receive an Implant Identification Card, which your doctor will fill out and which you must carry with you at all times. Show your Implant Identification Card if you report to an emergency room.  This card identifies you as a patient who has had a MitraClip device implanted. If you require a magnetic resonance imaging (MRI) scan, tell your doctor or MRI technician that you have a MitraClip device implanted. Test results indicate that patients with the MitraClip device can safely undergo MRI scans under certain conditions described on the card.  

## 2020-05-12 NOTE — Anesthesia Procedure Notes (Signed)
Procedure Name: Intubation Date/Time: 05/12/2020 11:39 AM Performed by: Thelma Comp, CRNA Pre-anesthesia Checklist: Patient identified, Emergency Drugs available, Suction available and Patient being monitored Patient Re-evaluated:Patient Re-evaluated prior to induction Oxygen Delivery Method: Circle System Utilized Preoxygenation: Pre-oxygenation with 100% oxygen Induction Type: IV induction Ventilation: Mask ventilation without difficulty Laryngoscope Size: Mac and 3 Grade View: Grade I Tube type: Oral Tube size: 7.0 mm Number of attempts: 1 Airway Equipment and Method: Stylet and Oral airway Placement Confirmation: ETT inserted through vocal cords under direct vision,  positive ETCO2 and breath sounds checked- equal and bilateral Secured at: 22 cm Tube secured with: Tape Dental Injury: Teeth and Oropharynx as per pre-operative assessment

## 2020-05-12 NOTE — Anesthesia Procedure Notes (Signed)
Arterial Line Insertion Start/End12/02/2020 10:30 AM, 05/12/2020 10:35 AM Performed by: Bryson Corona, CRNA, CRNA  Patient location: Pre-op. Preanesthetic checklist: patient identified, IV checked, site marked, risks and benefits discussed, surgical consent, monitors and equipment checked, pre-op evaluation, timeout performed and anesthesia consent Lidocaine 1% used for infiltration Right, radial was placed Catheter size: 20 Fr Hand hygiene performed  and maximum sterile barriers used   Attempts: 1 Procedure performed without using ultrasound guided technique. Following insertion, dressing applied and Biopatch. Post procedure assessment: normal and unchanged  Patient tolerated the procedure well with no immediate complications.

## 2020-05-12 NOTE — Interval H&P Note (Signed)
History and Physical Interval Note:  05/12/2020 11:16 AM  [name]  has presented today for surgery, with the diagnosis of Severe Mitral Valve Insufficiency.  The various methods of treatment have been discussed with the patient and family. After consideration of risks, benefits and other options for treatment, the patient has consented to  Procedure(s): MITRAL VALVE REPAIR (N/A) TRANSESOPHAGEAL ECHOCARDIOGRAM (TEE) (N/A) as a surgical intervention.  The patient's history has been reviewed, patient examined, no change in status, stable for surgery.  I have reviewed the patient's chart and labs.  Questions were answered to the patient's satisfaction.    The patient is evaluated in the short stay area.  She reports stable New York Heart Association functional class II symptoms of exertional dyspnea.  All of her questions were answered.  We will proceed with transcatheter edge-to-edge mitral valve repair as outlined above.  Sherren Mocha

## 2020-05-12 NOTE — Anesthesia Preprocedure Evaluation (Addendum)
Anesthesia Evaluation  Patient identified by MRN, date of birth, ID band Patient awake    Reviewed: Allergy & Precautions, NPO status , Patient's Chart, lab work & pertinent test results  History of Anesthesia Complications (+) PONV and history of anesthetic complications  Airway Mallampati: I       Dental  (+) Teeth Intact, Dental Advisory Given   Pulmonary neg pulmonary ROS,    breath sounds clear to auscultation       Cardiovascular hypertension, + CAD, + Past MI, + Cardiac Stents and +CHF   Rhythm:Regular Rate:Normal + Systolic murmurs    Neuro/Psych Anxiety negative neurological ROS     GI/Hepatic Neg liver ROS, PUD, GERD  ,  Endo/Other  negative endocrine ROS  Renal/GU CRFRenal disease     Musculoskeletal negative musculoskeletal ROS (+)   Abdominal Normal abdominal exam  (+)   Peds  Hematology negative hematology ROS (+)   Anesthesia Other Findings   Reproductive/Obstetrics                            Anesthesia Physical Anesthesia Plan  ASA: IV  Anesthesia Plan: General   Post-op Pain Management:    Induction: Intravenous  PONV Risk Score and Plan: 4 or greater and Ondansetron, Dexamethasone and Treatment may vary due to age or medical condition  Airway Management Planned: Oral ETT  Additional Equipment: Arterial line  Intra-op Plan:   Post-operative Plan: Extubation in OR  Informed Consent: I have reviewed the patients History and Physical, chart, labs and discussed the procedure including the risks, benefits and alternatives for the proposed anesthesia with the patient or authorized representative who has indicated his/her understanding and acceptance.     Dental advisory given  Plan Discussed with: CRNA  Anesthesia Plan Comments: (Echo:  1. Left ventricular ejection fraction, by estimation, is 40 to 45%. The  left ventricle has mild to moderately decreased  function. The left  ventricle demonstrates regional wall motion abnormalities (see scoring  diagram/findings for description). There is  mild concentric left ventricular hypertrophy. Left ventricular diastolic  function could not be evaluated. Elevated left atrial pressure. There is  severe hypokinesis of the left ventricular, entire anterolateral wall and  inferolateral wall.  2. Right ventricular systolic function is normal. The right ventricular  size is normal. There is moderately elevated pulmonary artery systolic  pressure. The estimated right ventricular systolic pressure is 75.6 mmHg.  3. Left atrial size was severely dilated.  4. Although there are myxomatous and degenerative changes of the mitral  leaflets, the major mechanism for mitral insufficiency appears to be  ischemic tethering of the posterior leaflet, rather than leaflet prolapse.  Effective regurgitant orifice area is  0.4 cm sq, regurgitant volume 60 mL, regurgitant fraction 50%. 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 Severe mitral valve regurgitation.  5. The tricuspid valve is myxomatous. Tricuspid valve regurgitation is  moderate.  6. The aortic valve is tricuspid. Aortic valve regurgitation is mild.  Mild aortic valve sclerosis is present, with no evidence of aortic valve  stenosis.  7. The inferior vena cava is dilated in size with <50% respiratory  variability, suggesting right atrial pressure of 15 mmHg.  8. Evidence of atrial level shunting detected by color flow Doppler.  There is a small secundum ASD or patent foramen ovale with predominantly  left to right shunting across the atrial septum. )        Anesthesia Quick Evaluation

## 2020-05-12 NOTE — Plan of Care (Signed)

## 2020-05-13 ENCOUNTER — Encounter (HOSPITAL_COMMUNITY): Payer: Self-pay | Admitting: Cardiovascular Disease

## 2020-05-13 ENCOUNTER — Inpatient Hospital Stay (HOSPITAL_COMMUNITY): Payer: Medicare Other

## 2020-05-13 DIAGNOSIS — I361 Nonrheumatic tricuspid (valve) insufficiency: Secondary | ICD-10-CM

## 2020-05-13 DIAGNOSIS — Z9889 Other specified postprocedural states: Secondary | ICD-10-CM

## 2020-05-13 DIAGNOSIS — Z954 Presence of other heart-valve replacement: Secondary | ICD-10-CM

## 2020-05-13 DIAGNOSIS — I351 Nonrheumatic aortic (valve) insufficiency: Secondary | ICD-10-CM

## 2020-05-13 DIAGNOSIS — I340 Nonrheumatic mitral (valve) insufficiency: Secondary | ICD-10-CM

## 2020-05-13 DIAGNOSIS — Z95818 Presence of other cardiac implants and grafts: Secondary | ICD-10-CM

## 2020-05-13 LAB — CBC
HCT: 29.8 % — ABNORMAL LOW (ref 36.0–46.0)
Hemoglobin: 9.3 g/dL — ABNORMAL LOW (ref 12.0–15.0)
MCH: 30.5 pg (ref 26.0–34.0)
MCHC: 31.2 g/dL (ref 30.0–36.0)
MCV: 97.7 fL (ref 80.0–100.0)
Platelets: 158 10*3/uL (ref 150–400)
RBC: 3.05 MIL/uL — ABNORMAL LOW (ref 3.87–5.11)
RDW: 13.2 % (ref 11.5–15.5)
WBC: 7 10*3/uL (ref 4.0–10.5)
nRBC: 0 % (ref 0.0–0.2)

## 2020-05-13 LAB — BASIC METABOLIC PANEL
Anion gap: 9 (ref 5–15)
BUN: 48 mg/dL — ABNORMAL HIGH (ref 8–23)
CO2: 18 mmol/L — ABNORMAL LOW (ref 22–32)
Calcium: 9.7 mg/dL (ref 8.9–10.3)
Chloride: 109 mmol/L (ref 98–111)
Creatinine, Ser: 1.9 mg/dL — ABNORMAL HIGH (ref 0.44–1.00)
GFR, Estimated: 26 mL/min — ABNORMAL LOW (ref >60)
Glucose, Bld: 185 mg/dL — ABNORMAL HIGH (ref 70–99)
Potassium: 5 mmol/L (ref 3.5–5.1)
Sodium: 136 mmol/L (ref 135–145)

## 2020-05-13 LAB — ABO/RH: ABO/RH(D): AB POS

## 2020-05-13 MED ORDER — ASPIRIN 81 MG PO CHEW: 81.0000 mg | CHEWABLE_TABLET | Freq: Every day | ORAL | 1 refills | Status: DC

## 2020-05-13 NOTE — Progress Notes (Incomplete)
Patient discharge information provided.

## 2020-05-13 NOTE — Progress Notes (Signed)
CARDIAC REHAB PHASE I   PRE:  Rate/Rhythm: 82 Afib     SaO2: 100 RA  MODE:  Ambulation: to chair  POST:  Rate/Rhythm: 121 Afib   Pt assisted from bed to recliner to eat lunch. During transition, pt HR jumped up to 120s, quickly falling with rest. Pt denies pain, SOB, or dizziness. States breathing improved from pre-procedure. Reviewed site care and restrictions. Encouraged ambulation with emphasis on safety. Pt denies further questions or concerns. Bedside table within reach along with call bell and phone. D/c today.  [id number] Rufina Falco, RN BSN 05/13/2020 11:49 AM

## 2020-05-13 NOTE — Progress Notes (Signed)
  Echocardiogram 2D Echocardiogram has been performed.  Jennette Dubin 05/13/2020, 10:46 AM

## 2020-05-13 NOTE — Discharge Summary (Addendum)
[place] VALVE TEAM  Discharge Summary    Patient ID: [name] MRN: [id number]; DOB: [birth year]  Admit date: 05/12/2020 Discharge date: 05/13/2020  Primary Care Provider: Forrest Moron, MD  Primary Cardiologist: Dr. Haroldine Laws   Discharge Diagnoses    Principal Problem:   S/P mitral valve clip implantation Active Problems:   HTN (hypertension), benign   GERD (gastroesophageal reflux disease)   Anxiety   PUD (peptic ulcer disease)   Pure hypercholesterolemia   Malignant neoplasm of lower-outer quadrant of left breast of female, estrogen receptor positive ([place])   A-fib ([employer])   Chronic kidney disease, stage IV (severe) (HCC)   Anemia associated with chronic renal failure   Non-rheumatic mitral regurgitation   Nonrheumatic mitral (valve) insufficiency   Allergies Allergies  Allergen Reactions  . Lisinopril Cough    Diagnostic Studies/Procedures    05/12/20 MITRAL VALVE REPAIR  Conclusion Successful transcatheter edge-to-edge repair using 2 MitraClip NTW devices, positioned on the medial and lateral sides of A2/P2 (first clip medial, second clip lateral), reducing MR severity from 4+ to 2+  Transmitral mean gradient 3 mmHg pre-procedure and 4 mmHg post-procedure  LA pressure V wave 66 mmHg baseline, 27 mmHg post-repair  _____________   Echo 05/14/20: complete but pending formal read at the time of discharge    History of Present Illness     [name] is a 85 y.o. female with a history of HTN, breast cancer, CAD s/p anterior STEMI 2/19, persistent afib on Eliquis, chronic systolic HF, and severe non rheumatic mitral valve regurgitation who presented to [facility] on 05/12/20 for planned TEER.   The patient suffered an acute myocardial infarction of the anterolateral wall in 2019.  At that time she was noted to have multivessel coronary artery disease, but a large ramus intermedius branch was found to be the culprit  vessel.  This vessel was totally occluded and was treated with overlapping drug-eluting stents.  There were recommendations to treat her residual coronary disease medically.  She was noted to have moderately severe LV systolic dysfunction at the time of her presentation with an LVEF of 35 to 40%.  The patient initially was noted to have moderate mitral regurgitation, but on serial echo studies she has been found to have progressive LV dilatation and worsening mitral regurgitation.  Mitral regurgitation was felt to be moderately severe in 2020.  However, her most recent echocardiogram in October 2021 demonstrated mild to moderate LV dysfunction with an LVEF of 40 to 45% with severe hypokinesis of the anterolateral wall and inferolateral walls.  She was felt to have severe secondary mitral regurgitation related to posterior leaflet tethering with an ER O of 0.4 cm, regurgitant fraction of 50%, and regurgitant volume of 60 mL.  She was also demonstrated to have moderate tricuspid regurgitation and mild aortic valve regurgitation.  The patient has been evaluated by the multidisciplinary valve team and felt to have severe, symptomatic mitral regurgitation and a candidate for TEER, which was set up for 05/12/20.   Hospital Course     Consultants: none  Severe non rheumatic mitral regurgitation: s/p successful transcatheter edge-to-edge repair using 2 MitraClip NTW devices, positioned on the medial and lateral sides of A2/P2 (first clip medial, second clip lateral), reducing MR severity from 4+ to 2+. Post op echo completed but pending formal read. She will be resumed on her long term oral anticoagulation (eliquis 2.5mg  BID) in addition to aspirin 81mg  daily x 3 months.  Plan for DC home today with close follow up in the office with me next week.   _____________  Discharge Vitals Blood pressure 106/74, pulse 85, temperature 97.7 F (36.5 C), temperature source Oral, resp. rate 11, height 5\' 8"  (1.727 m),  weight 61.6 kg, SpO2 94 %.  Filed Weights   05/12/20 0956 05/12/20 1545  Weight: 61.6 kg 61.6 kg    GEN: Well nourished, well developed, in no acute distress HEENT: normal Neck: no JVD or masses Cardiac: irreg irreg; no murmurs, rubs, or gallops,no edema  Respiratory:  clear to auscultation bilaterally, normal work of breathing GI: soft, nontender, nondistended, + BS MS: no deformity or atrophy Skin: warm and dry, no rash.  Groin site clear without hematoma or ecchymosis  Neuro:  Alert and Oriented x 3, Strength and sensation are intact Psych: euthymic mood, full affect   Labs & Radiologic Studies    CBC Recent Labs    05/13/20 0014  WBC 7.0  HGB 9.3*  HCT 29.8*  MCV 97.7  PLT 322   Basic Metabolic Panel Recent Labs    05/13/20 0014  NA 136  K 5.0  CL 109  CO2 18*  GLUCOSE 185*  BUN 48*  CREATININE 1.90*  CALCIUM 9.7   Liver Function Tests No results for input(s): AST, ALT, ALKPHOS, BILITOT, PROT, ALBUMIN in the last 72 hours. No results for input(s): LIPASE, AMYLASE in the last 72 hours. Cardiac Enzymes No results for input(s): CKTOTAL, CKMB, CKMBINDEX, TROPONINI in the last 72 hours. BNP Invalid input(s): POCBNP D-Dimer No results for input(s): DDIMER in the last 72 hours. Hemoglobin A1C No results for input(s): HGBA1C in the last 72 hours. Fasting Lipid Panel No results for input(s): CHOL, HDL, LDLCALC, TRIG, CHOLHDL, LDLDIRECT in the last 72 hours. Thyroid Function Tests No results for input(s): TSH, T4TOTAL, T3FREE, THYROIDAB in the last 72 hours.  Invalid input(s): FREET3 _____________  DG Chest 2 View  Result Date: 05/09/2020 CLINICAL DATA:  Preoperative evaluation for mitral valve repair, severe mitral insufficiency, shortness of breath, history breast cancer, hypertension, prior MI and coronary stenting EXAM: CHEST - 2 VIEW COMPARISON:  05/15/2018 FINDINGS: Enlargement of cardiac silhouette with coronary stents noted. Mediastinal contours and  pulmonary vascularity normal. Atherosclerotic calcification aorta. Emphysematous changes without pulmonary infiltrate, pleural effusion, or pneumothorax. Large calcification within the superior liver again seen. Prior LEFT breast surgery. Osseous demineralization with thoracic kyphosis and minimal chronic anterior height loss of a midthoracic vertebra. IMPRESSION: Enlargement of cardiac silhouette. Emphysematous changes without acute infiltrate. Electronically Signed   By: Lavonia Dana M.D.   On: 05/09/2020 16:00   CARDIAC CATHETERIZATION  Result Date: 05/12/2020 Successful transcatheter edge-to-edge repair using 2 MitraClip NTW devices, positioned on the medial and lateral sides of A2/P2 (first clip medial, second clip lateral), reducing MR severity from 4+ to 2+ Transmitral mean gradient 3 mmHg pre-procedure and 4 mmHg post-procedure LA pressure V wave 66 mmHg baseline, 27 mmHg post-repair  ECHO TEE  Result Date: 05/12/2020    TRANSESOPHOGEAL ECHO REPORT   Patient Name:   [name] Date of Exam: 05/12/2020 Medical Rec #:  [id number]      Height:       68.0 in Accession #:    [id number]     Weight:       135.9 lb Date of Birth:  [birth year]     BSA:          1.734 m Patient Age:    85 years  BP:           105/75 mmHg Patient Gender: F              HR:           130 bpm. Exam Location:  Inpatient Procedure: Transesophageal Echo, 3D Echo, Color Doppler and Cardiac Doppler Indications:     Mitral Regurgitation  History:         Patient has prior history of Echocardiogram examinations, most                  recent 04/05/2020. CHF, CAD, Arrythmias:Atrial Fibrillation;                  Risk Factors:Hypertension and Dyslipidemia.  Sonographer:     Raquel Sarna Senior RDCS Referring Phys:  [place] Diagnosing Phys: Ena Dawley MD  Sonographer Comments: MitraClip Procedure with 2 clips implanted PROCEDURE: The transesophogeal probe was passed without difficulty through the esophogus of the patient.  Sedation performed by different physician. The patient's vital signs; including heart rate, blood pressure, and oxygen saturation; remained stable throughout the procedure. The patient developed no complications during the procedure. IMPRESSIONS  1. Type 1 mitral regurgitation with prolapse of predominantly A2 scallop, with a massive eccentric wide jet directed centro-posteriorly of 4+ mitral regurgitation. There was reversal of forward flow in the right sided pulmonary veins, MVA 8 cm2, PISA radius 1.1 cm, ERO 0.49 cm2, Reg Volume 2ml. Two NTW MitraClips were successfully placed on the lateral and medial portion of A2-P2 scallops with improvement of MR from 4+ to 2+. There was resolution of pulmonary vein flow reversal, transmitral gradient  increased from 2 -> 5 mmHg. Post procedure there was a small iatrogenic ASD with predominantly left to right flow. There was no pericardial effusion prior or post procedure.  2. Left ventricular ejection fraction, by estimation, is 40 to 45%. The left ventricle has mildly decreased function. The left ventricle has no regional wall motion abnormalities. Left ventricular diastolic function could not be evaluated.  3. Right ventricular systolic function is normal. The right ventricular size is normal. There is moderately elevated pulmonary artery systolic pressure. The estimated right ventricular systolic pressure is 40.1 mmHg.  4. Left atrial size was severely dilated. No left atrial/left atrial appendage thrombus was detected.  5. Right atrial size was mildly dilated. Cor triatriatum dexter.  6. The mitral valve is myxomatous. severe 4+ mitral valve regurgitation. No evidence of mitral stenosis. There is severe holosystolic prolapse of multiple segments of the anterior leaflet of the mitral valve. The mean mitral valve gradient is 2.0 mmHg.  7. The tricuspid valve is myxomatous. Tricuspid valve regurgitation is severe.  8. The aortic valve is normal in structure. Aortic valve  regurgitation is not visualized. No aortic stenosis is present.  9. The inferior vena cava is normal in size with greater than 50% respiratory variability, suggesting right atrial pressure of 3 mmHg. 10. Evidence of atrial level shunting detected by color flow Doppler. There is a moderately sized patent foramen ovale. Conclusion(s)/Recommendation(s): Normal biventricular function without evidence of hemodynamically significant valvular heart disease. FINDINGS  Left Ventricle: Left ventricular ejection fraction, by estimation, is 40 to 45%. The left ventricle has mildly decreased function. The left ventricle has no regional wall motion abnormalities. The left ventricular internal cavity size was normal in size. There is no left ventricular hypertrophy. Left ventricular diastolic function could not be evaluated. Right Ventricle: The right ventricular size is normal. No increase in right  ventricular wall thickness. Right ventricular systolic function is normal. There is moderately elevated pulmonary artery systolic pressure. The tricuspid regurgitant velocity is 3.49 m/s, and with an assumed right atrial pressure of 3 mmHg, the estimated right ventricular systolic pressure is 55.7 mmHg. Left Atrium: Left atrial size was severely dilated. No left atrial/left atrial appendage thrombus was detected. Right Atrium: Right atrial size was mildly dilated. Prominent Eustachian valve and Cor triatriatum dexter. Pericardium: There is no evidence of pericardial effusion. Mitral Valve: The mitral valve is myxomatous. There is severe holosystolic prolapse of multiple segments of the anterior leaflet of the mitral valve. Severe 4+ mitral valve regurgitation, with posteriorly-directed jet. No evidence of mitral valve stenosis. MV peak gradient, 8.7 mmHg. The mean mitral valve gradient is 2.0 mmHg. Tricuspid Valve: The tricuspid valve is myxomatous. Tricuspid valve regurgitation is severe. No evidence of tricuspid stenosis. Aortic  Valve: The aortic valve is normal in structure. Aortic valve regurgitation is not visualized. No aortic stenosis is present. Pulmonic Valve: The pulmonic valve was normal in structure. Pulmonic valve regurgitation is not visualized. No evidence of pulmonic stenosis. Aorta: The aortic root is normal in size and structure. Venous: The inferior vena cava is normal in size with greater than 50% respiratory variability, suggesting right atrial pressure of 3 mmHg. IAS/Shunts: Evidence of atrial level shunting detected by color flow Doppler. A moderately sized patent foramen ovale is detected.  MITRAL VALVE                 TRICUSPID VALVE MV Peak grad: 8.7 mmHg       TR Peak grad:   48.7 mmHg MV Mean grad: 2.0 mmHg       TR Vmax:        349.00 cm/s MV Vmax:      1.48 m/s MV Vmean:     90.7 cm/s MR Peak grad:    113.2 mmHg MR Mean grad:    73.0 mmHg MR Vmax:         532.00 cm/s MR Vmean:        401.0 cm/s MR PISA:         7.60 cm MR PISA Eff ROA: 49 mm MR PISA Radius:  1.10 cm Ena Dawley MD Electronically signed by Ena Dawley MD Signature Date/Time: 05/12/2020/4:09:04 PM    Final (Updated)    Disposition   Pt is being discharged home today in good condition.  Follow-up Plans & Appointments     Follow-up Information    Eileen Stanford, PA-C. Go on 05/19/2020.   Specialties: Cardiology, Radiology Why: @ 1:30pm. Please arrive at least 10 minutes early.  Contact information: [address] [place] [address] [phone]                Discharge Medications   Allergies as of 05/13/2020      Reactions   Lisinopril Cough      Medication List    TAKE these medications   acetaminophen 500 MG tablet Commonly known as: TYLENOL Take 500-1,000 mg by mouth every 6 (six) hours as needed (for pain.).   anastrozole 1 MG tablet Commonly known as: ARIMIDEX Take 1 mg by mouth daily.   apixaban 2.5 MG Tabs tablet Commonly known as: Eliquis Take 1 tablet (2.5 mg  total) by mouth 2 (two) times daily.   aspirin 81 MG chewable tablet Chew 1 tablet (81 mg total) by mouth daily. Start taking on: May 14, 2020   atorvastatin 80 MG  tablet Commonly known as: LIPITOR TAKE 1 TABLET BY MOUTH ONCE DAILY AT 6 PM What changed: See the new instructions.   B-12 (Methylcobalamin) 1000 MCG Subl Take 1,000 mcg by mouth daily.   co-enzyme Q-10 30 MG capsule Take 30 mg by mouth 3 (three) times daily.   FISH OIL PO Take 1,600 mg by mouth at bedtime.   furosemide 20 MG tablet Commonly known as: LASIX Take 20 mg by mouth daily as needed for fluid.   losartan 25 MG tablet Commonly known as: COZAAR TAKE 1 TABLET BY MOUTH TWICE DAILY . APPOINTMENT REQUIRED FOR FUTURE REFILLS What changed: See the new instructions.   multivitamin with minerals Tabs tablet Take 1 tablet by mouth daily.   nitroGLYCERIN 0.3 MG SL tablet Commonly known as: NITROSTAT DISSOLVE ONE TABLET UNDER THE TONGUE EVERY 5 MINUTES AS NEEDED FOR CHEST PAIN.  DO NOT EXCEED A TOTAL OF 3 DOSES IN 15 MINUTES NOW What changed: See the new instructions.   spironolactone 25 MG tablet Commonly known as: ALDACTONE Take 1 tablet by mouth once daily   Ubiquinol 100 MG Caps Take 100 mg by mouth daily.   Vitamin D-3 125 MCG (5000 UT) Tabs Take 5,000 Units by mouth every morning. What changed: when to take this       Outstanding Labs/Studies   none  Duration of Discharge Encounter   Greater than 30 minutes including physician time.  Mable Fill, PA-C 05/13/2020, 11:46 AM [phone]  Patient seen, examined. Available data reviewed. Agree with findings, assessment, and plan as outlined by Nell Range, PA-C.  The patient is independently interviewed and examined.  She reports that she is already breathing better and notices a difference today.  Lungs are clear, heart is irregularly irregular with a 2/6 systolic murmur at the apex, abdomen soft and nontender, right groin  site is clear, there is no peripheral edema.  Echo reviewed and demonstrates appropriate position of both MitraClip devices.  There is moderate residual mitral regurgitation, marked improvement from baseline.  Patient is stable for discharge today as outlined above.  Follow-up has been arranged.  Postprocedural instructions reviewed with patient.  Sherren Mocha, M.D. 05/13/2020 1:32 PM

## 2020-05-13 NOTE — Progress Notes (Signed)
CARDIAC REHAB PHASE I   Offered to walk with pt. Pt requesting to walk later. Denies any complaints. Will f/u to encourage ambulation.  Rufina Falco, RN BSN 05/13/2020 9:26 AM

## 2020-05-14 LAB — ECHOCARDIOGRAM LIMITED
Area-P 1/2: 1.68 cm2
Height: 68 in
MV M vel: 4.14 m/s
MV Peak grad: 68.6 mmHg
P 1/2 time: 586 msec
Radius: 0.6 cm
Weight: 2172.85 oz

## 2020-05-16 ENCOUNTER — Telehealth: Payer: Self-pay | Admitting: Physician Assistant

## 2020-05-16 NOTE — Telephone Encounter (Signed)
  [facility]   MULTIDISCIPLINARY HEART VALVE TEAM   Patient contacted regarding discharge from [facility] on 12/10  Patient understands to follow up with provider Katie Haylen Shelnutt on 12/16 at [address]  Patient understands discharge instructions? yes Patient understands medications and regimen? yes Patient understands to bring all medications to this visit? yes  Clem Wisenbaker PA-C  [facility]      

## 2020-05-17 NOTE — Progress Notes (Signed)
[place]                                       Cardiology Office Note    Date:  05/19/2020   ID:  [name], DOB 01-Jun-1935, MRN [id number]  PCP:  Forrest Moron, MD  Cardiologist:  Dr. Haroldine Laws  CC: TOC s/p Mitralclip  History of Present Illness:  [name] is a 85 y.o. female with a history of HTN, breast cancer, CAD s/p anterior STEMI 2/19, persistent afib on Eliquis, chronic systolic HF, and severe non rheumatic mitral valve regurgitation s/p TEER (05/12/20) who presents to clinic for follow up.  The patient suffered an acute myocardial infarction of the anterolateral wall in 2019. At that time she was noted to have multivessel coronary artery disease, but a large ramus intermedius branch was found to be the culprit vessel. This vessel was totally occluded and was treated with overlapping drug-eluting stents. There were recommendations to treat her residual coronary disease medically. She was noted to have moderately severe LV systolic dysfunction at the time of her presentation with an LVEF of 35 to 40%. The patient initially was noted to have moderate mitral regurgitation, but on serial echo studies she has been found to have progressive LV dilatation and worsening mitral regurgitation. Mitral regurgitation was felt to be moderately severe in 2020. However, her most recent echocardiogram in October 2021 demonstrated mild to moderate LV dysfunction with an LVEF of 40 to 45% with severe hypokinesis of the anterolateral wall and inferolateral walls. She was felt to have severe secondary mitral regurgitation related to posterior leaflet tethering with an ER O of 0.4 cm, regurgitant fraction of 50%, and regurgitant volume of 60 mL. She was also demonstrated to have moderate tricuspid regurgitation and mild aortic valve regurgitation.  She was evaluated by the multidisciplinary valve team and underwent successful  transcatheter edge-to-edge repair using 2 MitraClip NTW devices, positioned on the medial and lateral sides of A2/P2 (first clip medial, second clip lateral), reducing MR severity from 4+ to 2+. Post op echo showed EF 45-50%, with moderate residual MR and mean gradient of 4.49mm Hg. She was resumed on home Eliquis with the addition of a baby aspirin 81mg  x 3 months daily .  Today he presents to clinic for follow up. No CP or SOB. No LE edema, orthopnea or PND. No dizziness or syncope. No blood in stool or urine. No palpitations. SHe can tell a difference in her breathing since having the clip and has a bit more stamina but does continue to struggle with fatigue.    Past Medical History:  Diagnosis Date  . Anemia   . Anxiety   . Atrial fibrillation ([place])   . Breast cancer of lower-outer quadrant of left female breast ([place]) 10/11/2014   treated with lumpectomy and radiation  . Chronic systolic CHF (congestive heart failure) ([place])   . CKD (chronic kidney disease)   . Coronary artery disease 07/27/2017  . GERD (gastroesophageal reflux disease)   . Hypertension   . Mitral regurgitation   . Myocardial infarction ([place]) 07/27/2017   STEMI of anterolateral wall. Cath w/ stent and cabg by Dr. Martinique  . Osteoporosis 10/20/2014  . Personal history of radiation therapy 2016   treatment of breast cancer  . PONV (postoperative nausea and vomiting)    pt denies this.  Marland Kitchen  PUD (peptic ulcer disease)   . Pure hypercholesterolemia 04/08/2014  . S/P mitral valve clip implantation 05/12/2020   s/p TEER with two NTW Mitraclip devices by Dr. Burt Knack  . Stone, kidney     Past Surgical History:  Procedure Laterality Date  . BREAST LUMPECTOMY Left 2016  . BREAST LUMPECTOMY WITH SENTINEL LYMPH NODE BIOPSY Left 10/22/2014   Procedure: BREAST LUMPECTOMY WITH SENTINEL LYMPH NODE BIOPSY;  Surgeon: Autumn Messing III, MD;  Location: [facility];  Service: General;  Laterality: Left;  . CARDIAC CATHETERIZATION     . CARDIOVERSION N/A 06/13/2018   Procedure: CARDIOVERSION;  Surgeon: Jolaine Artist, MD;  Location: [facility] ENDOSCOPY;  Service: Cardiovascular;  Laterality: N/A;  . CORONARY STENT INTERVENTION N/A 07/27/2017   Procedure: CORONARY STENT INTERVENTION;  Surgeon: Martinique, Peter M, MD;  Location: [facility] CV LAB;  Service: Cardiovascular;  Laterality: N/A;  . CORONARY/GRAFT ACUTE MI REVASCULARIZATION N/A 07/27/2017   Procedure: Coronary/Graft Acute MI Revascularization;  Surgeon: Martinique, Peter M, MD;  Location: [facility] CV LAB;  Service: Cardiovascular;  Laterality: N/A;  . LEFT HEART CATH AND CORONARY ANGIOGRAPHY N/A 07/27/2017   Procedure: LEFT HEART CATH AND CORONARY ANGIOGRAPHY;  Surgeon: Martinique, Peter M, MD;  Location: [facility] CV LAB;  Service: Cardiovascular;  Laterality: N/A;  . MITRAL VALVE REPAIR N/A 05/12/2020   Procedure: MITRAL VALVE REPAIR;  Surgeon: Sherren Mocha, MD;  Location: [facility] CV LAB;  Service: Cardiovascular;  Laterality: N/A;  . RE-EXCISION OF BREAST CANCER,SUPERIOR MARGINS Left 11/04/2014   Procedure: RE-EXCISION OF LEFT BREAST INFERIOR MARGINS;  Surgeon: Autumn Messing III, MD;  Location: [facility];  Service: General;  Laterality: Left;  . RIGHT HEART CATH N/A 04/05/2020   Procedure: RIGHT HEART CATH;  Surgeon: Jolaine Artist, MD;  Location: [facility] LAB;  Service: Cardiovascular;  Laterality: N/A;  . TEE WITHOUT CARDIOVERSION N/A 04/05/2020   Procedure: TRANSESOPHAGEAL ECHOCARDIOGRAM (TEE);  Surgeon: Jolaine Artist, MD;  Location: [facility] ENDOSCOPY;  Service: Cardiovascular;  Laterality: N/A;  . TEE WITHOUT CARDIOVERSION N/A 05/12/2020   Procedure: TRANSESOPHAGEAL ECHOCARDIOGRAM (TEE);  Surgeon: Sherren Mocha, MD;  Location: [facility] CV LAB;  Service: Cardiovascular;  Laterality: N/A;    Current Medications: Outpatient Medications Prior to Visit  Medication Sig Dispense Refill  . acetaminophen (TYLENOL) 500 MG tablet Take 500-1,000 mg by mouth every 6  (six) hours as needed (for pain.).    Marland Kitchen anastrozole (ARIMIDEX) 1 MG tablet Take 1 mg by mouth daily.    Marland Kitchen apixaban (ELIQUIS) 2.5 MG TABS tablet Take 1 tablet (2.5 mg total) by mouth 2 (two) times daily. 180 tablet 3  . aspirin 81 MG chewable tablet Chew 1 tablet (81 mg total) by mouth daily. 90 tablet 1  . atorvastatin (LIPITOR) 80 MG tablet TAKE 1 TABLET BY MOUTH ONCE DAILY AT 6 PM (Patient taking differently: Take 80 mg by mouth daily.) 90 tablet 0  . B-12, Methylcobalamin, 1000 MCG SUBL Take 1,000 mcg by mouth daily.    . Cholecalciferol (VITAMIN D-3) 125 MCG (5000 UT) TABS Take 5,000 Units by mouth every morning. (Patient taking differently: Take 5,000 Units by mouth in the morning and at bedtime.) 90 tablet 4  . co-enzyme Q-10 30 MG capsule Take 30 mg by mouth 3 (three) times daily.    . ferrous sulfate 325 (65 FE) MG EC tablet Take 325 mg by mouth 3 (three) times daily with meals.    . furosemide (LASIX) 20 MG tablet Take 20  mg by mouth daily as needed for fluid.     Marland Kitchen losartan (COZAAR) 25 MG tablet TAKE 1 TABLET BY MOUTH TWICE DAILY . APPOINTMENT REQUIRED FOR FUTURE REFILLS (Patient taking differently: Take 25 mg by mouth in the morning and at bedtime.) 180 tablet 0  . Magnesium 200 MG CHEW Chew 400 mg by mouth daily.    . Multiple Vitamin (MULTIVITAMIN WITH MINERALS) TABS tablet Take 1 tablet by mouth daily.    . nitroGLYCERIN (NITROSTAT) 0.3 MG SL tablet DISSOLVE ONE TABLET UNDER THE TONGUE EVERY 5 MINUTES AS NEEDED FOR CHEST PAIN.  DO NOT EXCEED A TOTAL OF 3 DOSES IN 15 MINUTES NOW (Patient taking differently: Place 0.3 mg under the tongue every 5 (five) minutes as needed for chest pain.) 100 tablet 0  . Omega-3 Fatty Acids (FISH OIL PO) Take 1,600 mg by mouth at bedtime.     Marland Kitchen spironolactone (ALDACTONE) 25 MG tablet Take 1 tablet by mouth once daily (Patient taking differently: Take 25 mg by mouth daily.) 90 tablet 0  . Ubiquinol 100 MG CAPS Take 100 mg by mouth daily.     No  facility-administered medications prior to visit.     Allergies:   Lisinopril   Social History   Socioeconomic History  . Marital status: Widowed    Spouse name: Not on file  . Number of children: Not on file  . Years of education: Not on file  . Highest education level: Not on file  Occupational History  . Occupation: retired  Tobacco Use  . Smoking status: Never Smoker  . Smokeless tobacco: Never Used  Vaping Use  . Vaping Use: Never used  Substance and Sexual Activity  . Alcohol use: No  . Drug use: No  . Sexual activity: Not on file  Other Topics Concern  . Not on file  Social History Narrative  . Not on file   Social Determinants of Health   Financial Resource Strain: Not on file  Food Insecurity: Not on file  Transportation Needs: Not on file  Physical Activity: Not on file  Stress: Not on file  Social Connections: Not on file     Family History:  The patient's family history includes Heart disease in her brother; Hypertension in her brother, brother, sister, and sister; Pancreatic cancer in her mother; Stroke in her father.     ROS:   Please see the history of present illness.    ROS All other systems reviewed and are negative.   PHYSICAL EXAM:   VS:  BP (!) 120/58   Pulse (!) 52   Ht 5\' 8"  (1.727 m)   Wt 139 lb (63 kg)   LMP  (LMP Unknown)   SpO2 98%   BMI 21.13 kg/m    GEN: Well nourished, well developed, in no acute distress HEENT: normal Neck: no JVD or masses Cardiac: irreg irreg; no murmurs, rubs, or gallops,no edema  Respiratory:  clear to auscultation bilaterally, normal work of breathing GI: soft, nontender, nondistended, + BS MS: no deformity or atrophy Skin: warm and dry, no rash.  Groin site clear without hematoma or ecchymosis  Neuro:  Alert and Oriented x 3, Strength and sensation are intact Psych: euthymic mood, full affect   Wt Readings from Last 3 Encounters:  05/19/20 139 lb (63 kg)  05/12/20 135 lb 12.9 oz (61.6 kg)   05/09/20 135 lb 14.4 oz (61.6 kg)      Studies/Labs Reviewed:   EKG:  EKG is not  ordered today.    Recent Labs: 05/09/2020: ALT 20; B Natriuretic Peptide 500.8 05/13/2020: BUN 48; Creatinine, Ser 1.90; Hemoglobin 9.3; Platelets 158; Potassium 5.0; Sodium 136   Lipid Panel    Component Value Date/Time   CHOL 133 02/12/2019 1113   CHOL 156 01/10/2018 1000   TRIG 39 02/12/2019 1113   HDL 66 02/12/2019 1113   HDL 74 01/10/2018 1000   CHOLHDL 2.0 02/12/2019 1113   VLDL 8 02/12/2019 1113   LDLCALC 59 02/12/2019 1113   LDLCALC 70 01/10/2018 1000    Additional studies/ records that were reviewed today include:  05/12/20 MITRAL VALVE REPAIR  Conclusion Successful transcatheter edge-to-edge repair using 2 MitraClip NTW devices, positioned on the medial and lateral sides of A2/P2 (first clip medial, second clip lateral), reducing MR severity from 4+ to 2+  Transmitral mean gradient 3 mmHg pre-procedure and 4 mmHg post-procedure  LA pressure V wave 66 mmHg baseline, 27 mmHg post-repair  _____________________  Echo 05/14/20 IMPRESSIONS  1. Day 1 post 2 Mitraclip placements, there is residual moderate mitral  regurgitation and mean transmitral gradient of 4.5 mmHg at 71 BPM.  2. Left ventricular ejection fraction, by estimation, is 45 to 50%. The  left ventricle has mildly decreased function. The left ventricle has no  regional wall motion abnormalities. Left ventricular diastolic function  could not be evaluated.  3. Right ventricular systolic function is normal. The right ventricular  size is normal. There is moderately elevated pulmonary artery systolic  pressure. The estimated right ventricular systolic pressure is 88.4 mmHg.  4. Left atrial size was severely dilated.  5. Right atrial size was mildly dilated.  6. The mitral valve is normal in structure. Moderate mitral valve  regurgitation. No evidence of mitral stenosis. There is a NTW MitralClip  present in the  mitral position. Procedure Date: 05/12/2020.  7. Tricuspid valve regurgitation is moderate to severe.  8. The aortic valve is normal in structure. Aortic valve regurgitation is  mild. No aortic stenosis is present.  9. The inferior vena cava is dilated in size with <50% respiratory  variability, suggesting right atrial pressure of 15 mmHg.    ASSESSMENT & PLAN:   Severe MR s/p TEER: doing well. Can tell a slight difference in her breathing and stamina since her Mitraclip. Still limited by fatigue. SBE prophylaxis discussed; I have RX'd amoxicillin. Continue on long term oral anticoagulation for afib with the addition of baby aspirin x 3 months. I will see her back next month for follow up echo and office visit.      Medication Adjustments/Labs and Tests Ordered: Current medicines are reviewed at length with the patient today.  Concerns regarding medicines are outlined above.  Medication changes, Labs and Tests ordered today are listed in the Patient Instructions below. Patient Instructions  Medication Instructions:  Your provider discussed the importance of taking an antibiotic prior to all dental visits to prevent damage to the heart valves from infection. You were given a prescription for AMOXIL 2,000 mg to take one hour prior to any dental appointment.   Follow-Up: Please keep your appointments as scheduled!    Signed, Angelena Form, PA-C  05/19/2020 2:22 PM    [facility] [address], [place], [place]  [address] Phone: [phone]; Fax: [phone]

## 2020-05-19 ENCOUNTER — Ambulatory Visit: Payer: Medicare Other | Admitting: Physician Assistant

## 2020-05-19 ENCOUNTER — Encounter: Payer: Self-pay | Admitting: Physician Assistant

## 2020-05-19 ENCOUNTER — Other Ambulatory Visit: Payer: Self-pay

## 2020-05-19 VITALS — BP 120/58 | HR 52 | Ht 68.0 in | Wt 139.0 lb

## 2020-05-19 DIAGNOSIS — Z9889 Other specified postprocedural states: Secondary | ICD-10-CM | POA: Diagnosis not present

## 2020-05-19 DIAGNOSIS — Z95818 Presence of other cardiac implants and grafts: Secondary | ICD-10-CM

## 2020-05-19 MED ORDER — AMOXICILLIN 500 MG PO TABS: ORAL_TABLET | ORAL | 11 refills | Status: AC

## 2020-05-19 NOTE — Patient Instructions (Signed)
Medication Instructions:  Your provider discussed the importance of taking an antibiotic prior to all dental visits to prevent damage to the heart valves from infection. You were given a prescription for AMOXIL 2,000 mg to take one hour prior to any dental appointment.   Follow-Up: Please keep your appointments as scheduled!

## 2020-05-20 ENCOUNTER — Other Ambulatory Visit: Payer: Self-pay | Admitting: Oncology

## 2020-05-24 ENCOUNTER — Other Ambulatory Visit: Payer: Self-pay | Admitting: Oncology

## 2020-05-29 LAB — ECHO TEE
MV M vel: 4.39 m/s
MV Peak grad: 76.9 mmHg
Radius: 0.9 cm

## 2020-06-03 ENCOUNTER — Telehealth: Payer: Self-pay | Admitting: Physician Assistant

## 2020-06-03 NOTE — Telephone Encounter (Signed)
Received a page from the patient's son telling me that the patient had some bleeding in the medial side of one of her eye.  She says she woke up this morning and it was itchy so she scratched the inner corner of her eye.  She subsequently noticed some bleeding in the area.  The size of the bleeding has not increased.  She denies any vision loss, visual compromise, or pain in the area.  She says she did not try to scratch it very hard.  Given lack of pain or vision disturbance, suspicion for globe injury fairly low.  I suspect she may have some subconjunctival hemorrhage due to trauma.  As long as the size of bleeding does not continue to expand, I would recommend she have her PCP to take a look at this on Monday, assume there is no pain or visual changes in the next 2 days.  If there are any of above symptoms, she has been instructed to seek medical attention in the ED or urgent care.

## 2020-06-15 ENCOUNTER — Ambulatory Visit (HOSPITAL_COMMUNITY): Payer: Medicare Other | Attending: Cardiology

## 2020-06-15 ENCOUNTER — Ambulatory Visit (INDEPENDENT_AMBULATORY_CARE_PROVIDER_SITE_OTHER): Payer: Medicare Other | Admitting: Physician Assistant

## 2020-06-15 ENCOUNTER — Other Ambulatory Visit: Payer: Self-pay

## 2020-06-15 ENCOUNTER — Encounter: Payer: Self-pay | Admitting: Physician Assistant

## 2020-06-15 ENCOUNTER — Other Ambulatory Visit (HOSPITAL_COMMUNITY): Payer: Medicare Other

## 2020-06-15 VITALS — BP 138/72 | HR 84 | Wt 137.2 lb

## 2020-06-15 DIAGNOSIS — Z95818 Presence of other cardiac implants and grafts: Secondary | ICD-10-CM

## 2020-06-15 DIAGNOSIS — I340 Nonrheumatic mitral (valve) insufficiency: Secondary | ICD-10-CM | POA: Diagnosis present

## 2020-06-15 DIAGNOSIS — I5022 Chronic systolic (congestive) heart failure: Secondary | ICD-10-CM | POA: Diagnosis not present

## 2020-06-15 DIAGNOSIS — Z954 Presence of other heart-valve replacement: Secondary | ICD-10-CM

## 2020-06-15 DIAGNOSIS — Z9889 Other specified postprocedural states: Secondary | ICD-10-CM

## 2020-06-15 LAB — ECHOCARDIOGRAM COMPLETE
Area-P 1/2: 2.88 cm2
MV VTI: 1.37 cm2
P 1/2 time: 375 msec
S' Lateral: 3.4 cm

## 2020-06-15 MED ORDER — ASPIRIN 81 MG PO CHEW: 81.0000 mg | CHEWABLE_TABLET | Freq: Every day | ORAL | 1 refills | Status: AC

## 2020-06-15 NOTE — Progress Notes (Signed)
[place]                                       Cardiology Office Note    Date:  06/15/2020   ID:  [name], DOB 08-25-1934, MRN [id number]  PCP:  Forrest Moron, MD  Cardiologist:  Dr. Haroldine Laws  CC: 1 month s/p Mitralclip  History of Present Illness:  [name] is a 86 y.o. female with a history of HTN, breast cancer, CAD s/p anterior STEMI 2/19, persistent afib on Eliquis, chronic systolic HF, CKD stage IV, and severe non rheumatic mitral valve regurgitation s/p TEER (05/12/20) who presents to clinic for follow up.  The patient suffered an acute myocardial infarction of the anterolateral wall in 2019. At that time she was noted to have multivessel coronary artery disease, but a large ramus intermedius branch was found to be the culprit vessel. This vessel was totally occluded and was treated with overlapping drug-eluting stents. There were recommendations to treat her residual coronary disease medically. She was noted to have moderately severe LV systolic dysfunction at the time of her presentation with an LVEF of 35 to 40%. The patient initially was noted to have moderate mitral regurgitation, but on serial echo studies she has been found to have progressive LV dilatation and worsening mitral regurgitation. Her most recent echocardiogram in October 2021 demonstrated mild to moderate LV dysfunction with an LVEF of 40 to 45% with severe hypokinesis of the anterolateral wall and inferolateral walls and severe secondary mitral regurgitation related to posterior leaflet tethering with an ER O of 0.4 cm, regurgitant fraction of 50%, and regurgitant volume of 60 mL. She was also demonstrated to have moderate tricuspid regurgitation and mild aortic valve regurgitation.  She was evaluated by the multidisciplinary valve team and underwent successful transcatheter edge-to-edge repair using 2 MitraClip NTW devices, positioned on the  medial and lateral sides of A2/P2 (first clip medial, second clip lateral), reducing MR severity from 4+ to 2+. Post op echo showed EF 45-50%, with moderate residual MR and mean gradient of 4.35mm Hg. She was resumed on home Eliquis with the addition of a baby aspirin 81mg  x 3 months daily. She has been doing well in follow up with some improvement in symptoms but mostly complained of fatigue.   Today he presents to clinic for follow up. Fatigue has improved. No complaints aside from occasional bilateral LE burning. Not related to exertion or movement. No rashes. No CP or SOB. No LE edema, orthopnea or PND. No dizziness or syncope. No blood in stool or urine. No palpitations.    Past Medical History:  Diagnosis Date  . Anemia   . Anxiety   . Atrial fibrillation ([place])   . Breast cancer of lower-outer quadrant of left female breast ([place]) 10/11/2014   treated with lumpectomy and radiation  . Chronic systolic CHF (congestive heart failure) ([place])   . CKD (chronic kidney disease)   . Coronary artery disease 07/27/2017  . GERD (gastroesophageal reflux disease)   . Hypertension   . Mitral regurgitation   . Myocardial infarction ([place]) 07/27/2017   STEMI of anterolateral wall. Cath w/ stent and cabg by Dr. Martinique  . Osteoporosis 10/20/2014  . Personal history of radiation therapy 2016   treatment of breast cancer  . PONV (postoperative nausea and vomiting)    pt denies  this.  . PUD (peptic ulcer disease)   . Pure hypercholesterolemia 04/08/2014  . S/P mitral valve clip implantation 05/12/2020   s/p TEER with two NTW Mitraclip devices by Dr. Burt Knack  . Stone, kidney     Past Surgical History:  Procedure Laterality Date  . BREAST LUMPECTOMY Left 2016  . BREAST LUMPECTOMY WITH SENTINEL LYMPH NODE BIOPSY Left 10/22/2014   Procedure: BREAST LUMPECTOMY WITH SENTINEL LYMPH NODE BIOPSY;  Surgeon: Autumn Messing III, MD;  Location: [facility];  Service: General;  Laterality: Left;  . CARDIAC  CATHETERIZATION    . CARDIOVERSION N/A 06/13/2018   Procedure: CARDIOVERSION;  Surgeon: Jolaine Artist, MD;  Location: [facility] ENDOSCOPY;  Service: Cardiovascular;  Laterality: N/A;  . CORONARY STENT INTERVENTION N/A 07/27/2017   Procedure: CORONARY STENT INTERVENTION;  Surgeon: Martinique, Peter M, MD;  Location: [facility] CV LAB;  Service: Cardiovascular;  Laterality: N/A;  . CORONARY/GRAFT ACUTE MI REVASCULARIZATION N/A 07/27/2017   Procedure: Coronary/Graft Acute MI Revascularization;  Surgeon: Martinique, Peter M, MD;  Location: [facility] CV LAB;  Service: Cardiovascular;  Laterality: N/A;  . LEFT HEART CATH AND CORONARY ANGIOGRAPHY N/A 07/27/2017   Procedure: LEFT HEART CATH AND CORONARY ANGIOGRAPHY;  Surgeon: Martinique, Peter M, MD;  Location: [facility] CV LAB;  Service: Cardiovascular;  Laterality: N/A;  . MITRAL VALVE REPAIR N/A 05/12/2020   Procedure: MITRAL VALVE REPAIR;  Surgeon: Sherren Mocha, MD;  Location: [place] [facility] CV LAB;  Service: Cardiovascular;  Laterality: N/A;  . RE-EXCISION OF BREAST CANCER,SUPERIOR MARGINS Left 11/04/2014   Procedure: RE-EXCISION OF LEFT BREAST INFERIOR MARGINS;  Surgeon: Autumn Messing III, MD;  Location: [facility];  Service: General;  Laterality: Left;  . RIGHT HEART CATH N/A 04/05/2020   Procedure: RIGHT HEART CATH;  Surgeon: Jolaine Artist, MD;  Location: [facility] CV LAB;  Service: Cardiovascular;  Laterality: N/A;  . TEE WITHOUT CARDIOVERSION N/A 04/05/2020   Procedure: TRANSESOPHAGEAL ECHOCARDIOGRAM (TEE);  Surgeon: Jolaine Artist, MD;  Location: [facility] ENDOSCOPY;  Service: Cardiovascular;  Laterality: N/A;  . TEE WITHOUT CARDIOVERSION N/A 05/12/2020   Procedure: TRANSESOPHAGEAL ECHOCARDIOGRAM (TEE);  Surgeon: Sherren Mocha, MD;  Location: [facility] CV LAB;  Service: Cardiovascular;  Laterality: N/A;    Current Medications: Outpatient Medications Prior to Visit  Medication Sig Dispense Refill  . acetaminophen (TYLENOL) 500 MG tablet Take 500-1,000 mg by  mouth every 6 (six) hours as needed (for pain.).    Marland Kitchen amoxicillin (AMOXIL) 500 MG tablet Take 4 tablets (2,000 mg total) 1 hour prior to all dental visits. 8 tablet 11  . apixaban (ELIQUIS) 2.5 MG TABS tablet Take 1 tablet (2.5 mg total) by mouth 2 (two) times daily. 180 tablet 3  . atorvastatin (LIPITOR) 80 MG tablet TAKE 1 TABLET BY MOUTH ONCE DAILY AT 6 PM (Patient taking differently: Take 80 mg by mouth daily.) 90 tablet 0  . B-12, Methylcobalamin, 1000 MCG SUBL Take 1,000 mcg by mouth daily.    . Cholecalciferol (VITAMIN D-3) 125 MCG (5000 UT) TABS Take 5,000 Units by mouth every morning. (Patient taking differently: Take 5,000 Units by mouth in the morning and at bedtime.) 90 tablet 4  . co-enzyme Q-10 30 MG capsule Take 30 mg by mouth 3 (three) times daily.    . ferrous sulfate 325 (65 FE) MG EC tablet Take 325 mg by mouth 3 (three) times daily with meals.    . furosemide (LASIX) 20 MG tablet Take 20 mg by mouth daily as needed for fluid.     Marland Kitchen  losartan (COZAAR) 25 MG tablet TAKE 1 TABLET BY MOUTH TWICE DAILY . APPOINTMENT REQUIRED FOR FUTURE REFILLS (Patient taking differently: Take 25 mg by mouth in the morning and at bedtime.) 180 tablet 0  . Magnesium 200 MG CHEW Chew 400 mg by mouth daily.    . Multiple Vitamin (MULTIVITAMIN WITH MINERALS) TABS tablet Take 1 tablet by mouth daily.    . nitroGLYCERIN (NITROSTAT) 0.3 MG SL tablet DISSOLVE ONE TABLET UNDER THE TONGUE EVERY 5 MINUTES AS NEEDED FOR CHEST PAIN.  DO NOT EXCEED A TOTAL OF 3 DOSES IN 15 MINUTES NOW (Patient taking differently: Place 0.3 mg under the tongue every 5 (five) minutes as needed for chest pain.) 100 tablet 0  . Omega-3 Fatty Acids (FISH OIL PO) Take 1,600 mg by mouth at bedtime.     Marland Kitchen spironolactone (ALDACTONE) 25 MG tablet Take 1 tablet by mouth once daily (Patient taking differently: Take 25 mg by mouth daily.) 90 tablet 0  . Ubiquinol 100 MG CAPS Take 100 mg by mouth daily.    Marland Kitchen aspirin 81 MG chewable tablet Chew 1  tablet (81 mg total) by mouth daily. 90 tablet 1  . anastrozole (ARIMIDEX) 1 MG tablet Take 1 mg by mouth daily. (Patient not taking: Reported on 06/15/2020)     No facility-administered medications prior to visit.     Allergies:   Lisinopril   Social History   Socioeconomic History  . Marital status: Widowed    Spouse name: Not on file  . Number of children: Not on file  . Years of education: Not on file  . Highest education level: Not on file  Occupational History  . Occupation: retired  Tobacco Use  . Smoking status: Never Smoker  . Smokeless tobacco: Never Used  Vaping Use  . Vaping Use: Never used  Substance and Sexual Activity  . Alcohol use: No  . Drug use: No  . Sexual activity: Not on file  Other Topics Concern  . Not on file  Social History Narrative  . Not on file   Social Determinants of Health   Financial Resource Strain: Not on file  Food Insecurity: Not on file  Transportation Needs: Not on file  Physical Activity: Not on file  Stress: Not on file  Social Connections: Not on file     Family History:  The patient's family history includes Heart disease in her brother; Hypertension in her brother, brother, sister, and sister; Pancreatic cancer in her mother; Stroke in her father.     ROS:   Please see the history of present illness.    ROS All other systems reviewed and are negative.   PHYSICAL EXAM:   VS:  BP 138/72   Pulse 84   Wt 137 lb 3.2 oz (62.2 kg)   LMP  (LMP Unknown)   SpO2 96%   BMI 20.86 kg/m    GEN: Well nourished, well developed, in no acute distress HEENT: normal Neck: no JVD or masses Cardiac: irreg irreg; soft murmur @ LUSB. No rubs, or gallops,no edema  Respiratory:  clear to auscultation bilaterally, normal work of breathing GI: soft, nontender, nondistended, + BS MS: no deformity or atrophy Skin: warm and dry, no rash.  Groin site clear without hematoma or ecchymosis  Neuro:  Alert and Oriented x 3, Strength and  sensation are intact Psych: euthymic mood, full affect   Wt Readings from Last 3 Encounters:  06/15/20 137 lb 3.2 oz (62.2 kg)  05/19/20 139 lb (63  kg)  05/12/20 135 lb 12.9 oz (61.6 kg)      Studies/Labs Reviewed:   EKG:  EKG is not ordered today.    Recent Labs: 05/09/2020: ALT 20; B Natriuretic Peptide 500.8 05/13/2020: BUN 48; Creatinine, Ser 1.90; Hemoglobin 9.3; Platelets 158; Potassium 5.0; Sodium 136   Lipid Panel    Component Value Date/Time   CHOL 133 02/12/2019 1113   CHOL 156 01/10/2018 1000   TRIG 39 02/12/2019 1113   HDL 66 02/12/2019 1113   HDL 74 01/10/2018 1000   CHOLHDL 2.0 02/12/2019 1113   VLDL 8 02/12/2019 1113   LDLCALC 59 02/12/2019 1113   LDLCALC 70 01/10/2018 1000    Additional studies/ records that were reviewed today include:  05/12/20 MITRAL VALVE REPAIR  Conclusion Successful transcatheter edge-to-edge repair using 2 MitraClip NTW devices, positioned on the medial and lateral sides of A2/P2 (first clip medial, second clip lateral), reducing MR severity from 4+ to 2+  Transmitral mean gradient 3 mmHg pre-procedure and 4 mmHg post-procedure  LA pressure V wave 66 mmHg baseline, 27 mmHg post-repair  _____________________  Echo 05/14/20 IMPRESSIONS  1. Day 1 post 2 Mitraclip placements, there is residual moderate mitral  regurgitation and mean transmitral gradient of 4.5 mmHg at 71 BPM.  2. Left ventricular ejection fraction, by estimation, is 45 to 50%. The  left ventricle has mildly decreased function. The left ventricle has no  regional wall motion abnormalities. Left ventricular diastolic function  could not be evaluated.  3. Right ventricular systolic function is normal. The right ventricular  size is normal. There is moderately elevated pulmonary artery systolic  pressure. The estimated right ventricular systolic pressure is 77.8 mmHg.  4. Left atrial size was severely dilated.  5. Right atrial size was mildly dilated.   6. The mitral valve is normal in structure. Moderate mitral valve  regurgitation. No evidence of mitral stenosis. There is a NTW MitralClip  present I/n the mitral position. Procedure Date: 05/12/2020.  7. Tricuspid valve regurgitation is moderate to severe.  8. The aortic valve is normal in structure. Aortic valve regurgitation is  mild. No aortic stenosis is present.  9. The inferior vena cava is dilated in size with <50% respiratory  variability, suggesting right atrial pressure of 15 mmHg.   ___________________  Echo 06/16/19 IMPRESSIONS    1. Abnormal septal motion . Left ventricular ejection fraction, by  estimation, is 40 to 45%. The left ventricle has mildly decreased  function. The left ventricle demonstrates global hypokinesis. There is  moderate left ventricular hypertrophy. Left  ventricular diastolic parameters are indeterminate.  2. Right ventricular systolic function is normal. The right ventricular  size is normal.  3. Left atrial size was severely dilated.  4. Persistent left to right shunt across trans septal puncture site .  Shunt.  5. Right atrial size was mildly dilated.  6. Post mitral clip using two NTW devices across A2/P2 mild residual MR  improved compared to previous. No MS mean gradient 3 mmHg MVA PT1/2 2.8  cm2. The mitral valve has been repaired/replaced. Mild mitral valve  regurgitation. No evidence of mitral  stenosis.  7. Tricuspid valve regurgitation is moderate.  8. The aortic valve is tricuspid. Aortic valve regurgitation is mild.  Mild aortic valve sclerosis is present, with no evidence of aortic valve  stenosis.  9. The inferior vena cava is normal in size with greater than 50%  respiratory variability, suggesting right atrial pressure of 3 mmHg.    ASSESSMENT &  PLAN:   Severe MR s/p TEER: doing excellent. Echo today shows EF 40-45%, with mild MR with a mean gradient of 3 mm Hg. Moderate TR. She has NYHA class I symptoms. SBE  prophylaxis discussed; she has amoxicillin. Continue on long term renally dosed Eliquis 2.5 mg BID for afib and aspirin x 12months. She can discontinue aspirin 08/10/20. I will see her back in 1 year with an echo.   Medication Adjustments/Labs and Tests Ordered: Current medicines are reviewed at length with the patient today.  Concerns regarding medicines are outlined above.  Medication changes, Labs and Tests ordered today are listed in the Patient Instructions below. Patient Instructions   Medication Instructions:  No changes - stop aspirin after March 9  *If you need a refill on your cardiac medications before your next appointment, please call your pharmacy*   Lab Work: none If you have labs (blood work) drawn today and your tests are completely normal, you will receive your results only by: Marland Kitchen MyChart Message (if you have MyChart) OR . A paper copy in the mail If you have any lab test that is abnormal or we need to change your treatment, we will call you to review the results.   Testing/Procedures: none   Follow-Up: As scheduled with Dr. Haroldine Laws.  Other Instructions      Signed, Angelena Form, PA-C  06/15/2020 3:34 PM    [facility] [address], [place], [place]  [address] Phone: [phone]; Fax: [phone]

## 2020-06-15 NOTE — Patient Instructions (Addendum)
  Medication Instructions:  No changes - stop aspirin after March 9  *If you need a refill on your cardiac medications before your next appointment, please call your pharmacy*   Lab Work: none If you have labs (blood work) drawn today and your tests are completely normal, you will receive your results only by: Marland Kitchen MyChart Message (if you have MyChart) OR . A paper copy in the mail If you have any lab test that is abnormal or we need to change your treatment, we will call you to review the results.   Testing/Procedures: none   Follow-Up: As scheduled with Dr. Haroldine Laws.  Other Instructions

## 2020-07-08 ENCOUNTER — Other Ambulatory Visit (HOSPITAL_COMMUNITY): Payer: Self-pay | Admitting: Internal Medicine

## 2020-07-18 ENCOUNTER — Other Ambulatory Visit (HOSPITAL_COMMUNITY): Payer: Self-pay | Admitting: Internal Medicine

## 2020-07-20 ENCOUNTER — Ambulatory Visit (HOSPITAL_COMMUNITY)
Admission: RE | Admit: 2020-07-20 | Discharge: 2020-07-20 | Disposition: A | Discharge status: Home / Self Care | Payer: Medicare Other | Source: Ambulatory Visit | Attending: Internal Medicine | Admitting: Internal Medicine

## 2020-07-20 ENCOUNTER — Other Ambulatory Visit: Payer: Self-pay

## 2020-07-20 ENCOUNTER — Encounter (HOSPITAL_COMMUNITY): Payer: Self-pay | Admitting: Internal Medicine

## 2020-07-20 VITALS — BP 122/78 | HR 83 | Wt 141.2 lb

## 2020-07-20 DIAGNOSIS — I5022 Chronic systolic (congestive) heart failure: Secondary | ICD-10-CM | POA: Insufficient documentation

## 2020-07-20 DIAGNOSIS — E7800 Pure hypercholesterolemia, unspecified: Secondary | ICD-10-CM | POA: Diagnosis not present

## 2020-07-20 DIAGNOSIS — Z95818 Presence of other cardiac implants and grafts: Secondary | ICD-10-CM

## 2020-07-20 DIAGNOSIS — Z79899 Other long term (current) drug therapy: Secondary | ICD-10-CM | POA: Insufficient documentation

## 2020-07-20 DIAGNOSIS — C50512 Malignant neoplasm of lower-outer quadrant of left female breast: Secondary | ICD-10-CM

## 2020-07-20 DIAGNOSIS — I340 Nonrheumatic mitral (valve) insufficiency: Secondary | ICD-10-CM | POA: Diagnosis not present

## 2020-07-20 DIAGNOSIS — I130 Hypertensive heart and chronic kidney disease with heart failure and stage 1 through stage 4 chronic kidney disease, or unspecified chronic kidney disease: Secondary | ICD-10-CM | POA: Diagnosis not present

## 2020-07-20 DIAGNOSIS — R110 Nausea: Secondary | ICD-10-CM | POA: Diagnosis not present

## 2020-07-20 DIAGNOSIS — C50912 Malignant neoplasm of unspecified site of left female breast: Secondary | ICD-10-CM | POA: Insufficient documentation

## 2020-07-20 DIAGNOSIS — Z951 Presence of aortocoronary bypass graft: Secondary | ICD-10-CM | POA: Diagnosis not present

## 2020-07-20 DIAGNOSIS — I4819 Other persistent atrial fibrillation: Secondary | ICD-10-CM

## 2020-07-20 DIAGNOSIS — Z8249 Family history of ischemic heart disease and other diseases of the circulatory system: Secondary | ICD-10-CM | POA: Insufficient documentation

## 2020-07-20 DIAGNOSIS — I2510 Atherosclerotic heart disease of native coronary artery without angina pectoris: Secondary | ICD-10-CM | POA: Insufficient documentation

## 2020-07-20 DIAGNOSIS — Z170 Estrogen receptor positive status [ER+]: Secondary | ICD-10-CM

## 2020-07-20 DIAGNOSIS — N1832 Chronic kidney disease, stage 3b: Secondary | ICD-10-CM | POA: Diagnosis not present

## 2020-07-20 DIAGNOSIS — I4821 Permanent atrial fibrillation: Secondary | ICD-10-CM | POA: Insufficient documentation

## 2020-07-20 DIAGNOSIS — Z955 Presence of coronary angioplasty implant and graft: Secondary | ICD-10-CM | POA: Diagnosis not present

## 2020-07-20 DIAGNOSIS — R251 Tremor, unspecified: Secondary | ICD-10-CM | POA: Diagnosis not present

## 2020-07-20 DIAGNOSIS — Z7982 Long term (current) use of aspirin: Secondary | ICD-10-CM | POA: Diagnosis not present

## 2020-07-20 DIAGNOSIS — Z923 Personal history of irradiation: Secondary | ICD-10-CM | POA: Insufficient documentation

## 2020-07-20 DIAGNOSIS — Z7901 Long term (current) use of anticoagulants: Secondary | ICD-10-CM | POA: Insufficient documentation

## 2020-07-20 DIAGNOSIS — I252 Old myocardial infarction: Secondary | ICD-10-CM | POA: Insufficient documentation

## 2020-07-20 DIAGNOSIS — Z888 Allergy status to other drugs, medicaments and biological substances status: Secondary | ICD-10-CM | POA: Diagnosis not present

## 2020-07-20 DIAGNOSIS — Z9889 Other specified postprocedural states: Secondary | ICD-10-CM

## 2020-07-20 DIAGNOSIS — Z853 Personal history of malignant neoplasm of breast: Secondary | ICD-10-CM | POA: Insufficient documentation

## 2020-07-20 LAB — LIPID PANEL
Cholesterol: 149 mg/dL (ref 0–200)
HDL: 80 mg/dL (ref >40)
LDL Cholesterol: 61 mg/dL (ref 0–99)
Total CHOL/HDL Ratio: 1.9 RATIO
Triglycerides: 38 mg/dL (ref <150)
VLDL: 8 mg/dL (ref 0–40)

## 2020-07-20 LAB — COMPREHENSIVE METABOLIC PANEL
ALT: 22 U/L (ref 0–44)
AST: 25 U/L (ref 15–41)
Albumin: 3.6 g/dL (ref 3.5–5.0)
Alkaline Phosphatase: 38 U/L (ref 38–126)
Anion gap: 10 (ref 5–15)
BUN: 36 mg/dL — ABNORMAL HIGH (ref 8–23)
CO2: 23 mmol/L (ref 22–32)
Calcium: 11 mg/dL — ABNORMAL HIGH (ref 8.9–10.3)
Chloride: 106 mmol/L (ref 98–111)
Creatinine, Ser: 1.91 mg/dL — ABNORMAL HIGH (ref 0.44–1.00)
GFR, Estimated: 26 mL/min — ABNORMAL LOW (ref >60)
Glucose, Bld: 71 mg/dL (ref 70–99)
Potassium: 4.8 mmol/L (ref 3.5–5.1)
Sodium: 139 mmol/L (ref 135–145)
Total Bilirubin: 0.9 mg/dL (ref 0.3–1.2)
Total Protein: 7 g/dL (ref 6.5–8.1)

## 2020-07-20 LAB — CBC
HCT: 33 % — ABNORMAL LOW (ref 36.0–46.0)
Hemoglobin: 9.9 g/dL — ABNORMAL LOW (ref 12.0–15.0)
MCH: 30.2 pg (ref 26.0–34.0)
MCHC: 30 g/dL (ref 30.0–36.0)
MCV: 100.6 fL — ABNORMAL HIGH (ref 80.0–100.0)
Platelets: 197 10*3/uL (ref 150–400)
RBC: 3.28 MIL/uL — ABNORMAL LOW (ref 3.87–5.11)
RDW: 12.6 % (ref 11.5–15.5)
WBC: 4.5 10*3/uL (ref 4.0–10.5)
nRBC: 0 % (ref 0.0–0.2)

## 2020-07-20 LAB — BRAIN NATRIURETIC PEPTIDE: B Natriuretic Peptide: 552.9 pg/mL — ABNORMAL HIGH (ref 0.0–100.0)

## 2020-07-20 NOTE — Patient Instructions (Signed)
Labs done today, your results will be available in MyChart, we will contact you for abnormal readings.  Please call our office in August 2022 to schedule your follow up appointment  If you have any questions or concerns before your next appointment please send [place] a message through [place] or call our office at [phone].    TO LEAVE A MESSAGE FOR THE NURSE SELECT OPTION 2, PLEASE LEAVE A MESSAGE INCLUDING: . YOUR NAME . DATE OF BIRTH . CALL BACK NUMBER . REASON FOR CALL**this is important as we prioritize the call backs  YOU WILL RECEIVE A CALL BACK THE SAME DAY AS LONG AS YOU CALL BEFORE 4:00 PM

## 2020-07-20 NOTE — Addendum Note (Signed)
Encounter addended by: Jolaine Artist, MD on: 07/20/2020 11:56 AM  Actions taken: Level of Service modified, Visit diagnoses modified

## 2020-07-20 NOTE — Progress Notes (Addendum)
Advanced Heart Failure Clinic Note   Date:  07/20/2020   ID:  [name], DOB [birth year], MRN [id number]  Location: Home  Provider location: [facility] Type of Visit: Established patient  PCP:  [name], MD  Cardiologist:  No primary care provider on file. Primary HF: [name]  Chief Complaint: Heart Failure follow-up   History of Present Illness:  [name] is a 86 y.o. female with h/o HTN, breast cancer, CAD s/p anterior STEMI 2/19, PAF and systolic HF.   Admitted to [facility]  ED 07/27/17 with CP.Hospital course was complicated by acute respiratory failure, bradycardic arrest, Afib RVR, and cardiogenic shock in the setting of anterior MI. Found to have acute anterior MI and takenurgently to thecath lab. LHC 07/27/17 as below with 3v CAD, LVEF 35-40%, elevated LVEDP. Underwent PCI with successful stenting of the ramus intermediate artery with DES x 2.. As she improved she was extubated on 2/24. An ECHO was completed an showed LVEF 40-45%.   On 06/13/18 she underwent cardioversion for recurrent A fib. She returned to the A fib clinic on 06/19/18 and was back in A Fib. Zio patch to assess AF rate = continuous AF with average rate 84. (was not on carvedilol at that time)   Echo 10/21 EF 40-45% severe MR. Mod TR. Mild AI.   Underwent MitraClip 05/12/20. Post op echo showed EF 45-50%, with moderate residual MR and mean gradient of 4.42mm Hg.  She presents today for routine f/u with her son. Doing great   Cath results:   LHC 07/27/2017  Post Atrio lesion is 90% stenosed.  Prox LAD to Dist LAD lesion is 50% stenosed.  Ost 1st Diag lesion is 95% stenosed.  Ost 2nd Diag to 2nd Diag lesion is 90% stenosed.  Mid Cx lesion is 30% stenosed.  Ost Ramus to Ramus lesion is 100% stenosed.  A drug-eluting stent was successfully placed using a STENT SYNERGY DES 2.75X28.  A drug-eluting stent was successfully placed using a STENT SYNERGY  DES 2.5X12.  Post intervention, there is a 0% residual stenosis.  There is moderate to severe left ventricular systolic dysfunction.  LV end diastolic pressure is moderately elevated.  The left ventricular ejection fraction is 35-45% by visual estimate. 1. 3 vessel obstructive CAD - 100% ramus intermediate artery. This is a large vessel and is the culprit lesion. - 95% small first diagonal - 90% small second diagonal  - diffusely diseased LAD - 90% PLOM    Past Medical History:  Diagnosis Date  . Anemia   . Anxiety   . Atrial fibrillation (HCC)   . Breast cancer of lower-outer quadrant of left female breast (HCC) 10/11/2014   treated with lumpectomy and radiation  . Chronic systolic CHF (congestive heart failure) (HCC)   . CKD (chronic kidney disease)   . Coronary artery disease 07/27/2017  . GERD (gastroesophageal reflux disease)   . Hypertension   . Mitral regurgitation   . Myocardial infarction (HCC) 07/27/2017   STEMI of anterolateral wall. Cath w/ stent and cabg by Dr. Swaziland  . Osteoporosis 10/20/2014  . Personal history of radiation therapy 2016   treatment of breast cancer  . PONV (postoperative nausea and vomiting)    pt denies this.  . PUD (peptic ulcer disease)   . Pure hypercholesterolemia 04/08/2014  . S/P mitral valve clip implantation 05/12/2020   s/p TEER with two NTW Mitraclip devices by Dr. Excell Seltzer  . Stone, kidney  Past Surgical History:  Procedure Laterality Date  . BREAST LUMPECTOMY Left 2016  . BREAST LUMPECTOMY WITH SENTINEL LYMPH NODE BIOPSY Left 10/22/2014   Procedure: BREAST LUMPECTOMY WITH SENTINEL LYMPH NODE BIOPSY;  Surgeon: Chevis Pretty III, MD;  Location: [facility];  Service: General;  Laterality: Left;  . CARDIAC CATHETERIZATION    . CARDIOVERSION N/A 06/13/2018   Procedure: CARDIOVERSION;  Surgeon: Dolores Patty, MD;  Location: [facility] ENDOSCOPY;  Service: Cardiovascular;  Laterality: N/A;  . CORONARY STENT  INTERVENTION N/A 07/27/2017   Procedure: CORONARY STENT INTERVENTION;  Surgeon: Swaziland, Peter M, MD;  Location: [facility] INVASIVE CV LAB;  Service: Cardiovascular;  Laterality: N/A;  . CORONARY/GRAFT ACUTE MI REVASCULARIZATION N/A 07/27/2017   Procedure: Coronary/Graft Acute MI Revascularization;  Surgeon: Swaziland, Peter M, MD;  Location: [facility] INVASIVE CV LAB;  Service: Cardiovascular;  Laterality: N/A;  . LEFT HEART CATH AND CORONARY ANGIOGRAPHY N/A 07/27/2017   Procedure: LEFT HEART CATH AND CORONARY ANGIOGRAPHY;  Surgeon: Swaziland, Peter M, MD;  Location: [facility] INVASIVE CV LAB;  Service: Cardiovascular;  Laterality: N/A;  . MITRAL VALVE REPAIR N/A 05/12/2020   Procedure: MITRAL VALVE REPAIR;  Surgeon: Tonny Bollman, MD;  Location: [facility] INVASIVE CV LAB;  Service: Cardiovascular;  Laterality: N/A;  . RE-EXCISION OF BREAST CANCER,SUPERIOR MARGINS Left 11/04/2014   Procedure: RE-EXCISION OF LEFT BREAST INFERIOR MARGINS;  Surgeon: Chevis Pretty III, MD;  Location: [facility] OR;  Service: General;  Laterality: Left;  . RIGHT HEART CATH N/A 04/05/2020   Procedure: RIGHT HEART CATH;  Surgeon: Dolores Patty, MD;  Location: [facility] INVASIVE CV LAB;  Service: Cardiovascular;  Laterality: N/A;  . TEE WITHOUT CARDIOVERSION N/A 04/05/2020   Procedure: TRANSESOPHAGEAL ECHOCARDIOGRAM (TEE);  Surgeon: Dolores Patty, MD;  Location: [facility] ENDOSCOPY;  Service: Cardiovascular;  Laterality: N/A;  . TEE WITHOUT CARDIOVERSION N/A 05/12/2020   Procedure: TRANSESOPHAGEAL ECHOCARDIOGRAM (TEE);  Surgeon: Tonny Bollman, MD;  Location: [facility] INVASIVE CV LAB;  Service: Cardiovascular;  Laterality: N/A;     Current Outpatient Medications  Medication Sig Dispense Refill  . acetaminophen (TYLENOL) 500 MG tablet Take 500-1,000 mg by mouth every 6 (six) hours as needed (for pain.).    Marland Kitchen amoxicillin (AMOXIL) 500 MG tablet Take 4 tablets (2,000 mg total) 1 hour prior to all dental visits. 8 tablet 11  . apixaban (ELIQUIS) 2.5 MG TABS tablet Take 1 tablet  (2.5 mg total) by mouth 2 (two) times daily. 180 tablet 3  . aspirin 81 MG chewable tablet Chew 1 tablet (81 mg total) by mouth daily. 90 tablet 1  . atorvastatin (LIPITOR) 80 MG tablet TAKE 1 TABLET BY MOUTH ONCE DAILY AT 6 PM 90 tablet 0  . B-12, Methylcobalamin, 1000 MCG SUBL Take 1,000 mcg by mouth daily.    . Cholecalciferol (VITAMIN D) 125 MCG (5000 UT) CAPS Take by mouth in the morning and at bedtime.    Marland Kitchen co-enzyme Q-10 30 MG capsule Take 30 mg by mouth 3 (three) times daily.    . ferrous sulfate 325 (65 FE) MG tablet Take 325 mg by mouth daily.    . furosemide (LASIX) 20 MG tablet Take 20 mg by mouth daily as needed for fluid.     Marland Kitchen losartan (COZAAR) 25 MG tablet TAKE 1 TABLET BY MOUTH TWICE DAILY . APPOINTMENT REQUIRED FOR FUTURE REFILLS 180 tablet 0  . Magnesium 200 MG CHEW Chew 400 mg by mouth daily.    . Multiple Vitamin (MULTIVITAMIN WITH MINERALS) TABS tablet Take 1 tablet by  mouth daily.    . nitroGLYCERIN (NITROSTAT) 0.3 MG SL tablet DISSOLVE ONE TABLET UNDER THE TONGUE EVERY 5 MINUTES AS NEEDED FOR CHEST PAIN.  DO NOT EXCEED A TOTAL OF 3 DOSES IN 15 MINUTES NOW 100 tablet 0  . Omega-3 Fatty Acids (FISH OIL PO) Take 1,600 mg by mouth at bedtime.     Marland Kitchen spironolactone (ALDACTONE) 25 MG tablet Take 1 tablet by mouth once daily 90 tablet 0   No current facility-administered medications for this encounter.    Allergies:   Lisinopril   Social History:  The patient  reports that she has never smoked. She has never used smokeless tobacco. She reports that she does not drink alcohol and does not use drugs.   Family History:  The patient's family history includes Heart disease in her brother; Hypertension in her brother, brother, sister, and sister; Pancreatic cancer in her mother; Stroke in her father.   ROS:  Please see the history of present illness.   All other systems are personally reviewed and negative.   Vitals:   07/20/20 1104  BP: 122/78  Pulse: 83  SpO2: 100%  Weight:  64 kg (141 lb 3.2 oz)    Exam:   General: Looks good. No resp difficulty HEENT: normal Neck: supple. no JVD. Carotids 2+ bilat; no bruits. No lymphadenopathy or thryomegaly appreciated. Cor: PMI nondisplaced. Regular rate & rhythm. No rubs, gallops or murmurs. (I do not here MR) Lungs: clear Abdomen: obese  soft, nontender, nondistended. No hepatosplenomegaly. No bruits or masses. Good bowel sounds. Extremities: no cyanosis, clubbing, rash, no edema Neuro: alert & orientedx3, cranial nerves grossly intact. moves all 4 extremities w/o difficulty. Affect pleasant   Recent Labs: 05/09/2020: ALT 20; B Natriuretic Peptide 500.8 05/13/2020: BUN 48; Creatinine, Ser 1.90; Hemoglobin 9.3; Platelets 158; Potassium 5.0; Sodium 136  Personally reviewed   Wt Readings from Last 3 Encounters:  07/20/20 64 kg (141 lb 3.2 oz)  06/15/20 62.2 kg (137 lb 3.2 oz)  05/19/20 63 kg (139 lb)      ASSESSMENT AND PLAN:  1. CAD - 3V CAD with STEMI 07/2017 S/P DES x2 to Ramus - No s/s ischemia - Continue eliquis, and high dose statin. Off ASA. - Intolerant of carvedilol 3.125 bid   2. Chronic Systolic Heart Failure, ICM- ECHO EF 07/2017 EF 40-45%   Echo 12/2017 EF 45-50%  - Echo 9/20 EF ~40-45% 3+ MR - Echo 03/18/20 EF 40-45% severe MR - Underwent MitraClip 05/12/20. Post op echo showed EF 45-50%, with moderate residual MR and mean gradient of 4.22mm Hg. - Volume status stable. Takes lasix 20mg  3-4x per week.  - Much improved NYHA II - Continue losartan 25 mg twice a day. - Continue 25 mg spironolactone daily  - Failed carvedilol due to fatigue - Check labs - Consider SGLT2i at next visit - Reinforced fluid restriction to < 2 L daily, sodium restriction to less than 2000 mg daily, and the importance of daily weights.  3. Permanent AF -S/P DC-CV 06/13/2018 to NSR but back in A fib on 1/16  - Remains in A fib. She was intolerant amio due to nausea and tremors.  - Dr Johney Frame was planning PPM insertion,  then uptitration of BB. She has deferred this procedure for now.  - Zio 9/21 shows avg rate 84bpm. Intolerant of carvedilol 3.125 (failed 2x) - Previously followed in AF clinic.  - Continue eliquis 2.5 mg BID. No bleeding. Check labs today  4. Mitral regurgitation - ischemic  in nature. 3+ MR on echo - s/p MitraClip 12/21 - Much improved  5. HTN - BP ok   6. H.O Left Breast Cancer - No change to current plan.    7. CKD IIIb - Creatinine 1.5-1.9 at baseline - recheck today - Follows with nephrology - Dr. Kathrene Bongo - May be candidate for SGLT2i  Signed, Arvilla Meres, MD  07/20/2020 11:10 AM  [facility] [address] [facility] [place] [address] [phone] (office) [phone] (fax)

## 2020-07-20 NOTE — Addendum Note (Signed)
Encounter addended by: Jolaine Artist, MD on: 07/20/2020 1:01 PM  Actions taken: Clinical Note Signed

## 2020-08-30 ENCOUNTER — Other Ambulatory Visit: Payer: Self-pay

## 2020-08-30 ENCOUNTER — Inpatient Hospital Stay: Payer: Medicare Other

## 2020-08-30 ENCOUNTER — Inpatient Hospital Stay: Payer: Medicare Other | Attending: Oncology

## 2020-08-30 VITALS — BP 158/67 | HR 95

## 2020-08-30 DIAGNOSIS — M818 Other osteoporosis without current pathological fracture: Secondary | ICD-10-CM

## 2020-08-30 DIAGNOSIS — M810 Age-related osteoporosis without current pathological fracture: Secondary | ICD-10-CM | POA: Insufficient documentation

## 2020-08-30 DIAGNOSIS — Z853 Personal history of malignant neoplasm of breast: Secondary | ICD-10-CM | POA: Diagnosis not present

## 2020-08-30 DIAGNOSIS — C50512 Malignant neoplasm of lower-outer quadrant of left female breast: Secondary | ICD-10-CM

## 2020-08-30 DIAGNOSIS — Z170 Estrogen receptor positive status [ER+]: Secondary | ICD-10-CM

## 2020-08-30 LAB — CMP (CANCER CENTER ONLY)
ALT: 25 U/L (ref 0–44)
AST: 27 U/L (ref 15–41)
Albumin: 4 g/dL (ref 3.5–5.0)
Alkaline Phosphatase: 40 U/L (ref 38–126)
Anion gap: 10 (ref 5–15)
BUN: 42 mg/dL — ABNORMAL HIGH (ref 8–23)
CO2: 25 mmol/L (ref 22–32)
Calcium: 10.5 mg/dL — ABNORMAL HIGH (ref 8.9–10.3)
Chloride: 106 mmol/L (ref 98–111)
Creatinine: 2.18 mg/dL — ABNORMAL HIGH (ref 0.44–1.00)
GFR, Estimated: 22 mL/min — ABNORMAL LOW (ref >60)
Glucose, Bld: 149 mg/dL — ABNORMAL HIGH (ref 70–99)
Potassium: 4.9 mmol/L (ref 3.5–5.1)
Sodium: 141 mmol/L (ref 135–145)
Total Bilirubin: 0.7 mg/dL (ref 0.3–1.2)
Total Protein: 7.4 g/dL (ref 6.5–8.1)

## 2020-08-30 LAB — CBC WITH DIFFERENTIAL/PLATELET
Abs Immature Granulocytes: 0.01 10*3/uL (ref 0.00–0.07)
Basophils Absolute: 0 10*3/uL (ref 0.0–0.1)
Basophils Relative: 1 %
Eosinophils Absolute: 0.1 10*3/uL (ref 0.0–0.5)
Eosinophils Relative: 2 %
HCT: 32.6 % — ABNORMAL LOW (ref 36.0–46.0)
Hemoglobin: 10.3 g/dL — ABNORMAL LOW (ref 12.0–15.0)
Immature Granulocytes: 0 %
Lymphocytes Relative: 15 %
Lymphs Abs: 0.7 10*3/uL (ref 0.7–4.0)
MCH: 31.3 pg (ref 26.0–34.0)
MCHC: 31.6 g/dL (ref 30.0–36.0)
MCV: 99.1 fL (ref 80.0–100.0)
Monocytes Absolute: 0.4 10*3/uL (ref 0.1–1.0)
Monocytes Relative: 8 %
Neutro Abs: 3.3 10*3/uL (ref 1.7–7.7)
Neutrophils Relative %: 74 %
Platelets: 164 10*3/uL (ref 150–400)
RBC: 3.29 MIL/uL — ABNORMAL LOW (ref 3.87–5.11)
RDW: 12.8 % (ref 11.5–15.5)
WBC: 4.5 10*3/uL (ref 4.0–10.5)
nRBC: 0 % (ref 0.0–0.2)

## 2020-08-30 MED ORDER — DENOSUMAB 60 MG/ML ~~LOC~~ SOSY
60.0000 mg | PREFILLED_SYRINGE | Freq: Once | SUBCUTANEOUS | Status: AC
  Administered 2020-08-30: 60 mg via SUBCUTANEOUS

## 2020-08-30 MED ORDER — DENOSUMAB 60 MG/ML ~~LOC~~ SOSY
PREFILLED_SYRINGE | SUBCUTANEOUS | Status: AC
  Filled 2020-08-30: qty 1

## 2020-09-19 ENCOUNTER — Other Ambulatory Visit (HOSPITAL_COMMUNITY): Payer: Self-pay | Admitting: Internal Medicine

## 2020-09-19 ENCOUNTER — Other Ambulatory Visit (HOSPITAL_COMMUNITY): Payer: Self-pay | Admitting: Cardiology

## 2020-10-06 ENCOUNTER — Other Ambulatory Visit (HOSPITAL_COMMUNITY): Payer: Self-pay | Admitting: Internal Medicine

## 2020-10-07 ENCOUNTER — Other Ambulatory Visit (HOSPITAL_COMMUNITY): Payer: Self-pay | Admitting: Internal Medicine

## 2021-01-06 ENCOUNTER — Other Ambulatory Visit (HOSPITAL_COMMUNITY): Payer: Self-pay | Admitting: Internal Medicine

## 2021-03-02 ENCOUNTER — Ambulatory Visit (HOSPITAL_BASED_OUTPATIENT_CLINIC_OR_DEPARTMENT_OTHER): Payer: Medicare Other | Admitting: Oncology

## 2021-03-02 ENCOUNTER — Ambulatory Visit: Payer: Medicare Other

## 2021-03-02 ENCOUNTER — Encounter: Payer: Self-pay | Admitting: Oncology

## 2021-03-02 ENCOUNTER — Other Ambulatory Visit: Payer: Medicare Other

## 2021-03-02 DIAGNOSIS — C50512 Malignant neoplasm of lower-outer quadrant of left female breast: Secondary | ICD-10-CM

## 2021-03-02 DIAGNOSIS — Z170 Estrogen receptor positive status [ER+]: Secondary | ICD-10-CM

## 2021-03-02 NOTE — Progress Notes (Addendum)
[place]  [phone] Fax:([phone]) [phone]     ID: [name] DOB: 25-Sep-1934  MR#: [id number]  XLK#:[id number]  Patient Care Team: Mary Moron, MD as PCP - General (Internal Medicine) Bensimhon, Mary Pascal, MD as PCP - Advanced Heart Failure (Cardiology) Clarene Essex, MD as Consulting Physician (Gastroenterology) Magrinat, Virgie Dad, MD as Consulting Physician (Oncology) Larey Dresser, MD as Consulting Physician (Cardiology) OTHER MD:  CHIEF COMPLAINT: Estrogen receptor positive breast cancer  CURRENT TREATMENT: Completed 5 years of anastrozole   INTERVAL HISTORY: [name] was scheduled today for follow-up of her estrogen receptor positive breast cancer.  However she did not show.  At the last visit we stopped anastrozole.  She would have been discharged from follow-up except we scheduled her for Prolia dose today.  The last mammogram I have on record is from 2019    REVIEW OF SYSTEMS: [place]: [name]   BREAST CANCER HISTORY: From the original intake note:  [name] noted a palpable mass in the left breast and brought it to her primary care physician's attention. On 09/27/2014 the patient underwent bilateral diagnostic mammography with tomosynthesis and left breast ultrasonography. The breast density was category B. In the outer left breast area there was an irregularly shaped mass not associated with suspicious microcalcifications or distortion. The previously described large of fibroadenolipoma in the right breast was unchanged. On physical exam there was a firm palpable mass at the 3:30 position in the left breast 2 cm prone the nipple which, on targeted ultrasound, was irregular, hypoechoic, and measured 2.0 cm. Ultrasound of the left axilla was negative.  Biopsy of this mass 10/05/2014 showed ([id number]) an invasive ductal carcinoma him a grade 1, estrogen receptor 90% positive, progesterone receptor 90%  positive, both with strong staining intensity, with an MIB-1 of 5%, and no HER-2 amplification, the signals ratio being 1.06 and the number per cell 1.70.  The patient's subsequent history is as detailed below   PAST MEDICAL HISTORY: Past Medical History:  Diagnosis Date   Anemia    Anxiety    Atrial fibrillation ([place])    Breast cancer of lower-outer quadrant of left female breast ([place]) 10/11/2014   treated with lumpectomy and radiation   Chronic systolic CHF (congestive heart failure) (H[facility])    CKD (chronic kidney disease)    Coronary artery disease 07/27/2017   GERD (gastroesophageal reflux disease)    Hypertension    Mitral regurgitation    Myocardial infarction ([place]) 07/27/2017   STEMI of anterolateral wall. Cath w/ stent and cabg by Dr. Martinique   Osteoporosis 10/20/2014   Personal history of radiation therapy 2016   treatment of breast cancer   PONV (postoperative nausea and vomiting)    pt denies this.   PUD (peptic ulcer disease)    Pure hypercholesterolemia 04/08/2014   S/P mitral valve clip implantation 05/12/2020   s/p TEER with two NTW Mitraclip devices by Dr. Clare Charon, kidney     PAST SURGICAL HISTORY: Past Surgical History:  Procedure Laterality Date   BREAST LUMPECTOMY Left 2016   BREAST LUMPECTOMY WITH SENTINEL LYMPH NODE BIOPSY Left 10/22/2014   Procedure: BREAST LUMPECTOMY WITH SENTINEL LYMPH NODE BIOPSY;  Surgeon: Autumn Messing III, MD;  Location: [facility];  Service: General;  Laterality: Left;   CARDIAC CATHETERIZATION     CARDIOVERSION N/A 06/13/2018   Procedure: CARDIOVERSION;  Surgeon: Jolaine Artist, MD;  Location: [facility];  Service: Cardiovascular;  Laterality: N/A;   CORONARY STENT INTERVENTION N/A 07/27/2017   Procedure: CORONARY STENT INTERVENTION;  Surgeon: Martinique, Peter M, MD;  Location: [facility];  Service: Cardiovascular;  Laterality: N/A;   CORONARY/GRAFT ACUTE MI REVASCULARIZATION N/A 07/27/2017   Procedure:  Coronary/Graft Acute MI Revascularization;  Surgeon: Martinique, Peter M, MD;  Location: [facility];  Service: Cardiovascular;  Laterality: N/A;   LEFT HEART CATH AND CORONARY ANGIOGRAPHY N/A 07/27/2017   Procedure: LEFT HEART CATH AND CORONARY ANGIOGRAPHY;  Surgeon: Martinique, Peter M, MD;  Location: [facility];  Service: Cardiovascular;  Laterality: N/A;   MITRAL VALVE REPAIR N/A 05/12/2020   Procedure: MITRAL VALVE REPAIR;  Surgeon: Sherren Mocha, MD;  Location: [facility];  Service: Cardiovascular;  Laterality: N/A;   RE-EXCISION OF BREAST CANCER,SUPERIOR MARGINS Left 11/04/2014   Procedure: RE-EXCISION OF LEFT BREAST INFERIOR MARGINS;  Surgeon: Autumn Messing III, MD;  Location: [facility];  Service: General;  Laterality: Left;   RIGHT HEART CATH N/A 04/05/2020   Procedure: RIGHT HEART CATH;  Surgeon: Jolaine Artist, MD;  Location: [facility];  Service: Cardiovascular;  Laterality: N/A;   TEE WITHOUT CARDIOVERSION N/A 04/05/2020   Procedure: TRANSESOPHAGEAL ECHOCARDIOGRAM (TEE);  Surgeon: Jolaine Artist, MD;  Location: [facility];  Service: Cardiovascular;  Laterality: N/A;   TEE WITHOUT CARDIOVERSION N/A 05/12/2020   Procedure: TRANSESOPHAGEAL ECHOCARDIOGRAM (TEE);  Surgeon: Sherren Mocha, MD;  Location: [facility];  Service: Cardiovascular;  Laterality: N/A;    FAMILY HISTORY Family History  Problem Relation Age of Onset   Pancreatic cancer Mother        pancreastic   Stroke Father    Hypertension Brother    Hypertension Sister    Hypertension Brother    Heart disease Brother    Hypertension Sister    the patient's father died following a stroke at age 80. The patient's mother died from pancreatic cancer the age of 48. The patient had 5 brothers, 2 sisters. There is no history of breast or ovarian cancer in the family to the patient's knowledge    GYNECOLOGIC HISTORY:  No LMP recorded (lmp unknown). Patient is postmenopausal.  menarche age 38,  first live birth age 3, the patient is [place] P4. She went through the change of life in her late 31s. She took hormone replacement for approximately 3 years. She also took oral contraceptives for a few years remotely, with no complications.    SOCIAL HISTORY: (Updated September 2021. 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 Her husband [name] was a Horticulturist, commercial.  He died at home unexpectedly 2020-03-20 from blood clots in the lung.  Daughter [name] lives in  [place], near [place], where she Biomedical engineer. Son [name] lives in [place] and works in Building control surveyor. Son [name] also lives in [place]. He is a Environmental education officer with [employer]. Daughter [name] lives in [place] and teaches in [place] The patient has 11 grandchildren. She attends a local [place] DIRECTIVES:  the patient has named her son [name] as healthcare power of attorney. He can be reached at [phone].    HEALTH MAINTENANCE: Social History   Tobacco Use   Smoking status: Never   Smokeless tobacco: Never  Vaping Use   Vaping Use: Never used  Substance Use Topics   Alcohol use: No   Drug use:  No     Colonoscopy: Due 2019?  PAP:  Bone density:April 2018   Allergies  Allergen Reactions   Lisinopril Cough    Current Outpatient Medications  Medication Sig Dispense Refill   acetaminophen (TYLENOL) 500 MG tablet Take 500-1,000 mg by mouth every 6 (six) hours as needed (for pain.).     amoxicillin (AMOXIL) 500 MG tablet Take 4 tablets (2,000 mg total) 1 hour prior to all dental visits. 8 tablet 11   atorvastatin (LIPITOR) 80 MG tablet TAKE 1 TABLET BY MOUTH ONCE DAILY AT  6  PM 90 tablet 0   B-12, Methylcobalamin, 1000 MCG SUBL Take 1,000 mcg by mouth daily.     Cholecalciferol (VITAMIN D) 125 MCG (5000 UT) CAPS Take by mouth in the morning and at bedtime.     co-enzyme Q-10 30  MG capsule Take 30 mg by mouth 3 (three) times daily.     ELIQUIS 2.5 MG TABS tablet Take 1 tablet by mouth twice daily 60 tablet 6   ferrous sulfate 325 (65 FE) MG tablet Take 325 mg by mouth daily.     furosemide (LASIX) 20 MG tablet TAKE ONE TABLET BY MOUTH EVERY OTHER DAY ALTERNATING 2 TABLETS EVERY OTHER DAY 135 tablet 3   losartan (COZAAR) 25 MG tablet Take 1 tablet (25 mg total) by mouth in the morning and at bedtime. 180 tablet 3   Magnesium 200 MG CHEW Chew 400 mg by mouth daily.     Multiple Vitamin (MULTIVITAMIN WITH MINERALS) TABS tablet Take 1 tablet by mouth daily.     nitroGLYCERIN (NITROSTAT) 0.3 MG SL tablet DISSOLVE ONE TABLET UNDER THE TONGUE EVERY 5 MINUTES AS NEEDED FOR CHEST PAIN.  DO NOT EXCEED A TOTAL OF 3 DOSES IN 15 MINUTES NOW 100 tablet 0   Omega-3 Fatty Acids (FISH OIL PO) Take 1,600 mg by mouth at bedtime.      spironolactone (ALDACTONE) 25 MG tablet Take 1 tablet by mouth once daily 90 tablet 3   No current facility-administered medications for this visit.    OBJECTIVE: African-American woman who appears younger than stated age  There were no vitals filed for this visit.    There is no height or weight on file to calculate BMI.    ECOG FS:1 - Symptomatic but completely ambulatory      LAB RESULTS:  CMP     Component Value Date/Time   NA 141 08/30/2020 1239   NA 138 07/21/2019 1019   NA 143 05/21/2017 0959   K 4.9 08/30/2020 1239   K 4.0 05/21/2017 0959   CL 106 08/30/2020 1239   CO2 25 08/30/2020 1239   CO2 26 05/21/2017 0959   GLUCOSE 149 (H) 08/30/2020 1239   GLUCOSE 86 05/21/2017 0959   BUN 42 (H) 08/30/2020 1239   BUN 40 (H) 07/21/2019 1019   BUN 16.7 05/21/2017 0959   CREATININE 2.18 (H) 08/30/2020 1239   CREATININE 1.1 05/21/2017 0959   CALCIUM 10.5 (H) 08/30/2020 1239   CALCIUM 10.3 05/21/2017 0959   PROT 7.4 08/30/2020 1239   PROT 7.0 07/21/2019 1019   PROT 6.9 05/21/2017 0959   ALBUMIN 4.0 08/30/2020 1239   ALBUMIN 4.6  07/21/2019 1019   ALBUMIN 3.7 05/21/2017 0959   AST 27 08/30/2020 1239   AST 18 05/21/2017 0959   ALT 25 08/30/2020 1239   ALT 14 05/21/2017 0959   ALKPHOS 40 08/30/2020 1239   ALKPHOS 42 05/21/2017 0959   BILITOT 0.7 08/30/2020 1239  BILITOT 0.61 05/21/2017 0959   GFRNONAA 22 (L) 08/30/2020 1239   GFRNONAA 63 09/09/2014 1150   GFRAA 20 (L) 03/01/2020 1350   GFRAA 72 09/09/2014 1150    INo results found for: SPEP, UPEP  Lab Results  Component Value Date   WBC 4.5 08/30/2020   NEUTROABS 3.3 08/30/2020   HGB 10.3 (L) 08/30/2020   HCT 32.6 (L) 08/30/2020   MCV 99.1 08/30/2020   PLT 164 08/30/2020      Chemistry      Component Value Date/Time   NA 141 08/30/2020 1239   NA 138 07/21/2019 1019   NA 143 05/21/2017 0959   K 4.9 08/30/2020 1239   K 4.0 05/21/2017 0959   CL 106 08/30/2020 1239   CO2 25 08/30/2020 1239   CO2 26 05/21/2017 0959   BUN 42 (H) 08/30/2020 1239   BUN 40 (H) 07/21/2019 1019   BUN 16.7 05/21/2017 0959   CREATININE 2.18 (H) 08/30/2020 1239   CREATININE 1.1 05/21/2017 0959      Component Value Date/Time   CALCIUM 10.5 (H) 08/30/2020 1239   CALCIUM 10.3 05/21/2017 0959   ALKPHOS 40 08/30/2020 1239   ALKPHOS 42 05/21/2017 0959   AST 27 08/30/2020 1239   AST 18 05/21/2017 0959   ALT 25 08/30/2020 1239   ALT 14 05/21/2017 0959   BILITOT 0.7 08/30/2020 1239   BILITOT 0.61 05/21/2017 0959       No results found for: LABCA2  No components found for: KDXIP382  No results for input(s): INR in the last 168 hours.  Urinalysis    Component Value Date/Time   COLORURINE YELLOW 05/09/2020 1047   APPEARANCEUR CLEAR 05/09/2020 1047   LABSPEC 1.012 05/09/2020 1047   PHURINE 5.0 05/09/2020 1047   GLUCOSEU NEGATIVE 05/09/2020 1047   HGBUR NEGATIVE 05/09/2020 1047   BILIRUBINUR NEGATIVE 05/09/2020 1047   BILIRUBINUR negative 05/15/2018 1032   BILIRUBINUR neg 09/14/2014 1308   KETONESUR NEGATIVE 05/09/2020 1047   PROTEINUR NEGATIVE 05/09/2020  1047   UROBILINOGEN 0.2 05/15/2018 1032   UROBILINOGEN 0.2 02/01/2011 1158   NITRITE NEGATIVE 05/09/2020 1047   LEUKOCYTESUR NEGATIVE 05/09/2020 1047    STUDIES: No results found.   ASSESSMENT: 86 y.o.  [place] woman status post left breast lower outer quadrant biopsy 10/05/2014 for a clinical T1 N0, stage IA invasive ductal breast cancer, grade 1, estrogen and progesterone receptor positive, HER-2 not amplified, with an MIB-1 of 5%   (1) left lumpectomy and sentinel lymph node sampling 10/22/2014 showed a pT1c pN0, stage IA mixed ductal and mucinous invasive carcinoma, grade 2, repeat HER-2 again negative  (a) positive margins were cleared with subsequent surgery 11/04/2014.  (2) radiation  12/21/2014-01/11/2015:   Left Breast / 42.56 Gy in 16 fractions   (3) anastrozole started September 2016, completing 5 years September 2021  (a) bone density 09/27/2014 shows osteoporosis  (b) denosumab/Prolia started 05/19/2015, to be repeated every 6 months.  (c) repeat bone density 09/28/2016 showed a T score of -3.3; this was stable  (d) bone density October 2021 (not done)   PLAN: Yael did not show for her 03/02/2021 visit.  Since she had actually completed her 5 years of anastrozole at the last visit, and this was really only for Prolia, which could be continued through her primary care physician their discretion, we are not sending a f rescheduling letter but merely a letter to let her know that we will be glad to see her again in the future if needed  when the need arises.    Magrinat, Virgie Dad, MD  03/02/21 2:26 PM Medical Oncology and Hematology [facility] [place], [place] [address] Tel. [phone]    Fax. [phone]   I, Wilburn Mylar, am acting as scribe for Dr. Virgie Dad. Magrinat.  I, Lurline Del MD, have reviewed the above documentation for accuracy and completeness, and I agree with the above.   *Total Encounter Time as defined by  the Centers for Medicare and Medicaid Services includes, in addition to the face-to-face time of a patient visit (documented in the note above) non-face-to-face time: obtaining and reviewing outside history, ordering and reviewing medications, tests or procedures, care coordination (communications with other health care professionals or caregivers) and documentation in the medical record.

## 2021-03-03 ENCOUNTER — Telehealth: Payer: Self-pay | Admitting: Oncology

## 2021-03-03 NOTE — Telephone Encounter (Signed)
Sch per 9/29 inbasket,pt aware. 

## 2021-03-07 ENCOUNTER — Inpatient Hospital Stay: Payer: Medicare Other | Admitting: Oncology

## 2021-03-07 ENCOUNTER — Other Ambulatory Visit: Payer: Self-pay

## 2021-03-07 ENCOUNTER — Inpatient Hospital Stay: Payer: Medicare Other

## 2021-03-07 ENCOUNTER — Other Ambulatory Visit: Payer: Self-pay | Admitting: *Deleted

## 2021-03-07 ENCOUNTER — Inpatient Hospital Stay: Payer: Medicare Other | Attending: Oncology

## 2021-03-07 VITALS — BP 136/66 | HR 87 | Temp 97.8°F | Wt 147.5 lb

## 2021-03-07 DIAGNOSIS — C50512 Malignant neoplasm of lower-outer quadrant of left female breast: Secondary | ICD-10-CM

## 2021-03-07 DIAGNOSIS — Z170 Estrogen receptor positive status [ER+]: Secondary | ICD-10-CM

## 2021-03-07 DIAGNOSIS — M818 Other osteoporosis without current pathological fracture: Secondary | ICD-10-CM

## 2021-03-07 DIAGNOSIS — Z853 Personal history of malignant neoplasm of breast: Secondary | ICD-10-CM | POA: Diagnosis not present

## 2021-03-07 DIAGNOSIS — M810 Age-related osteoporosis without current pathological fracture: Secondary | ICD-10-CM | POA: Diagnosis not present

## 2021-03-07 LAB — CBC WITH DIFFERENTIAL/PLATELET
Abs Immature Granulocytes: 0.02 10*3/uL (ref 0.00–0.07)
Basophils Absolute: 0.1 10*3/uL (ref 0.0–0.1)
Basophils Relative: 1 %
Eosinophils Absolute: 0.1 10*3/uL (ref 0.0–0.5)
Eosinophils Relative: 2 %
HCT: 33.2 % — ABNORMAL LOW (ref 36.0–46.0)
Hemoglobin: 10.4 g/dL — ABNORMAL LOW (ref 12.0–15.0)
Immature Granulocytes: 0 %
Lymphocytes Relative: 12 %
Lymphs Abs: 0.6 10*3/uL — ABNORMAL LOW (ref 0.7–4.0)
MCH: 31 pg (ref 26.0–34.0)
MCHC: 31.3 g/dL (ref 30.0–36.0)
MCV: 99.1 fL (ref 80.0–100.0)
Monocytes Absolute: 0.6 10*3/uL (ref 0.1–1.0)
Monocytes Relative: 11 %
Neutro Abs: 3.7 10*3/uL (ref 1.7–7.7)
Neutrophils Relative %: 74 %
Platelets: 201 10*3/uL (ref 150–400)
RBC: 3.35 MIL/uL — ABNORMAL LOW (ref 3.87–5.11)
RDW: 12.9 % (ref 11.5–15.5)
WBC: 5.1 10*3/uL (ref 4.0–10.5)
nRBC: 0 % (ref 0.0–0.2)

## 2021-03-07 LAB — CMP (CANCER CENTER ONLY)
ALT: 18 U/L (ref 0–44)
AST: 24 U/L (ref 15–41)
Albumin: 4 g/dL (ref 3.5–5.0)
Alkaline Phosphatase: 46 U/L (ref 38–126)
Anion gap: 11 (ref 5–15)
BUN: 38 mg/dL — ABNORMAL HIGH (ref 8–23)
CO2: 23 mmol/L (ref 22–32)
Calcium: 11.7 mg/dL — ABNORMAL HIGH (ref 8.9–10.3)
Chloride: 107 mmol/L (ref 98–111)
Creatinine: 2.29 mg/dL — ABNORMAL HIGH (ref 0.44–1.00)
GFR, Estimated: 21 mL/min — ABNORMAL LOW (ref >60)
Glucose, Bld: 86 mg/dL (ref 70–99)
Potassium: 4.8 mmol/L (ref 3.5–5.1)
Sodium: 141 mmol/L (ref 135–145)
Total Bilirubin: 0.8 mg/dL (ref 0.3–1.2)
Total Protein: 7.5 g/dL (ref 6.5–8.1)

## 2021-03-07 MED ORDER — DENOSUMAB 60 MG/ML ~~LOC~~ SOSY: 60.0000 mg | PREFILLED_SYRINGE | Freq: Once | SUBCUTANEOUS | Status: DC

## 2021-03-07 NOTE — Progress Notes (Signed)
[facility]  Telephone:([phone] Fax:([phone]) [phone]     ID: [name] DOB: [birth year]  MR#: [id number]  JYN#:[id number]  Patient Care Team: [name], MD as PCP - General (Internal Medicine) Bensimhon, Bevelyn Buckles, MD as PCP - Advanced Heart Failure (Cardiology) [name], MD as Consulting Physician (Gastroenterology) Lemoyne Nestor, [name], MD as Consulting Physician (Oncology) [name], MD as Consulting Physician (Cardiology) OTHER MD:  CHIEF COMPLAINT: Estrogen receptor positive breast cancer  CURRENT TREATMENT: Completed 5 years of anastrozole   INTERVAL HISTORY: [name] returns today for follow-up of her estrogen receptor positive breast cancer.  She is now under observation.  She received her fifth Prolia dose on 08/30/2020.  She is scheduled for a dose tomorrow.  The last mammogram I have on record is from 2019.    REVIEW OF SYSTEMS: [name] generally feels well.  She walks for exercise and does a lot of gardening.  She denies falls.  She is very involved with her extensive family who are very attentive to her.  A detailed review of systems was otherwise stable   COVID 19 VACCINATION STATUS: Pfizer x4   BREAST CANCER HISTORY: From the original intake note:  [name] noted a palpable mass in the left breast and brought it to her primary care physician's attention. On 09/27/2014 the patient underwent bilateral diagnostic mammography with tomosynthesis and left breast ultrasonography. The breast density was category B. In the outer left breast area there was an irregularly shaped mass not associated with suspicious microcalcifications or distortion. The previously described large of fibroadenolipoma in the right breast was unchanged. On physical exam there was a firm palpable mass at the 3:30 position in the left breast 2 cm prone the nipple which, on targeted ultrasound, was irregular, hypoechoic, and measured 2.0 cm. Ultrasound of the left axilla  was negative.  Biopsy of this mass 10/05/2014 showed ([id number]) an invasive ductal carcinoma him a grade 1, estrogen receptor 90% positive, progesterone receptor 90% positive, both with strong staining intensity, with an MIB-1 of 5%, and no HER-2 amplification, the signals ratio being 1.06 and the number per cell 1.70.  The patient's subsequent history is as detailed below   PAST MEDICAL HISTORY: Past Medical History:  Diagnosis Date   Anemia    Anxiety    Atrial fibrillation (HCC)    Breast cancer of lower-outer quadrant of left female breast (HCC) 10/11/2014   treated with lumpectomy and radiation   Chronic systolic CHF (congestive heart failure) (HCC)    CKD (chronic kidney disease)    Coronary artery disease 07/27/2017   GERD (gastroesophageal reflux disease)    Hypertension    Mitral regurgitation    Myocardial infarction (HCC) 07/27/2017   STEMI of anterolateral wall. Cath w/ stent and cabg by Dr. Swaziland   Osteoporosis 10/20/2014   Personal history of radiation therapy 2016   treatment of breast cancer   PONV (postoperative nausea and vomiting)    pt denies this.   PUD (peptic ulcer disease)    Pure hypercholesterolemia 04/08/2014   S/P mitral valve clip implantation 05/12/2020   s/p TEER with two NTW Mitraclip devices by Dr. Torrie Mayers, kidney     PAST SURGICAL HISTORY: Past Surgical History:  Procedure Laterality Date   BREAST LUMPECTOMY Left 2016   BREAST LUMPECTOMY WITH SENTINEL LYMPH NODE BIOPSY Left 10/22/2014   Procedure: BREAST LUMPECTOMY WITH SENTINEL LYMPH NODE BIOPSY;  Surgeon: Chevis Pretty III, MD;  Location: [facility];  Service: General;  Laterality: Left;   CARDIAC CATHETERIZATION     CARDIOVERSION N/A 06/13/2018   Procedure: CARDIOVERSION;  Surgeon: Dolores Patty, MD;  Location: [facility] ENDOSCOPY;  Service: Cardiovascular;  Laterality: N/A;   CORONARY STENT INTERVENTION N/A 07/27/2017   Procedure: CORONARY STENT INTERVENTION;  Surgeon:  Swaziland, Peter M, MD;  Location: [facility] INVASIVE CV LAB;  Service: Cardiovascular;  Laterality: N/A;   CORONARY/GRAFT ACUTE MI REVASCULARIZATION N/A 07/27/2017   Procedure: Coronary/Graft Acute MI Revascularization;  Surgeon: Swaziland, Peter M, MD;  Location: [facility] INVASIVE CV LAB;  Service: Cardiovascular;  Laterality: N/A;   LEFT HEART CATH AND CORONARY ANGIOGRAPHY N/A 07/27/2017   Procedure: LEFT HEART CATH AND CORONARY ANGIOGRAPHY;  Surgeon: Swaziland, Peter M, MD;  Location: [facility] INVASIVE CV LAB;  Service: Cardiovascular;  Laterality: N/A;   MITRAL VALVE REPAIR N/A 05/12/2020   Procedure: MITRAL VALVE REPAIR;  Surgeon: Tonny Bollman, MD;  Location: [facility] INVASIVE CV LAB;  Service: Cardiovascular;  Laterality: N/A;   RE-EXCISION OF BREAST CANCER,SUPERIOR MARGINS Left 11/04/2014   Procedure: RE-EXCISION OF LEFT BREAST INFERIOR MARGINS;  Surgeon: Chevis Pretty III, MD;  Location: [facility] OR;  Service: General;  Laterality: Left;   RIGHT HEART CATH N/A 04/05/2020   Procedure: RIGHT HEART CATH;  Surgeon: Dolores Patty, MD;  Location: [facility] INVASIVE CV LAB;  Service: Cardiovascular;  Laterality: N/A;   TEE WITHOUT CARDIOVERSION N/A 04/05/2020   Procedure: TRANSESOPHAGEAL ECHOCARDIOGRAM (TEE);  Surgeon: Dolores Patty, MD;  Location: [facility] ENDOSCOPY;  Service: Cardiovascular;  Laterality: N/A;   TEE WITHOUT CARDIOVERSION N/A 05/12/2020   Procedure: TRANSESOPHAGEAL ECHOCARDIOGRAM (TEE);  Surgeon: Tonny Bollman, MD;  Location: [facility] INVASIVE CV LAB;  Service: Cardiovascular;  Laterality: N/A;    FAMILY HISTORY Family History  Problem Relation Age of Onset   Pancreatic cancer Mother        pancreastic   Stroke Father    Hypertension Brother    Hypertension Sister    Hypertension Brother    Heart disease Brother    Hypertension Sister    the patient's father died following a stroke at age 58. The patient's mother died from pancreatic cancer the age of 32. The patient had 5 brothers, 2 sisters. There is no history of breast  or ovarian cancer in the family to the patient's knowledge    GYNECOLOGIC HISTORY:  No LMP recorded (lmp unknown). Patient is postmenopausal.  menarche age 89, first live birth age 60, the patient is GX P4. She went through the change of life in her late 7s. She took hormone replacement for approximately 3 years. She also took oral contraceptives for a few years remotely, with no complications.    SOCIAL HISTORY: (Updated September 2021. 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 Her husband [name] was a Scientist, water quality.  He died at home unexpectedly Mar 14, 2020 from blood clots in the lung.  Daughter [name] lives in  [place], near [place], where she Museum/gallery exhibitions officer. Son [name] lives in [place] and works in Sales promotion account executive. Son [name] also lives in [place]. He is a Armed forces technical officer with [employer]. Daughter [name] lives in [place] and teaches in [place] [employer] [address] ([place] and [place]). The patient has 11 grandchildren. She attends a local [employer]     ADVANCED DIRECTIVES:  the patient has named her son [name] as healthcare power of attorney. He can be reached at [phone].    HEALTH MAINTENANCE: Social History  Tobacco Use   Smoking status: Never   Smokeless tobacco: Never  Vaping Use   Vaping Use: Never used  Substance Use Topics   Alcohol use: No   Drug use: No     Colonoscopy: Due 2019?  PAP:  Bone density:April 2018   Allergies  Allergen Reactions   Lisinopril Cough    Current Outpatient Medications  Medication Sig Dispense Refill   acetaminophen (TYLENOL) 500 MG tablet Take 500-1,000 mg by mouth every 6 (six) hours as needed (for pain.).     amoxicillin (AMOXIL) 500 MG tablet Take 4 tablets (2,000 mg total) 1 hour prior to all dental visits. 8 tablet 11   atorvastatin (LIPITOR) 80 MG tablet TAKE 1 TABLET BY MOUTH ONCE DAILY AT  6  PM 90 tablet 0   B-12, Methylcobalamin, 1000  MCG SUBL Take 1,000 mcg by mouth daily.     Cholecalciferol (VITAMIN D) 125 MCG (5000 UT) CAPS Take by mouth in the morning and at bedtime.     co-enzyme Q-10 30 MG capsule Take 30 mg by mouth 3 (three) times daily.     ELIQUIS 2.5 MG TABS tablet Take 1 tablet by mouth twice daily 60 tablet 6   ferrous sulfate 325 (65 FE) MG tablet Take 325 mg by mouth daily.     furosemide (LASIX) 20 MG tablet TAKE ONE TABLET BY MOUTH EVERY OTHER DAY ALTERNATING 2 TABLETS EVERY OTHER DAY 135 tablet 3   losartan (COZAAR) 25 MG tablet Take 1 tablet (25 mg total) by mouth in the morning and at bedtime. 180 tablet 3   Magnesium 200 MG CHEW Chew 400 mg by mouth daily.     Multiple Vitamin (MULTIVITAMIN WITH MINERALS) TABS tablet Take 1 tablet by mouth daily.     nitroGLYCERIN (NITROSTAT) 0.3 MG SL tablet DISSOLVE ONE TABLET UNDER THE TONGUE EVERY 5 MINUTES AS NEEDED FOR CHEST PAIN.  DO NOT EXCEED A TOTAL OF 3 DOSES IN 15 MINUTES NOW 100 tablet 0   Omega-3 Fatty Acids (FISH OIL PO) Take 1,600 mg by mouth at bedtime.      spironolactone (ALDACTONE) 25 MG tablet Take 1 tablet by mouth once daily 90 tablet 3   No current facility-administered medications for this visit.    OBJECTIVE: African-American woman in no acute distress  Vitals:   03/07/21 1447  BP: 136/66  Pulse: 87  Temp: 97.8 F (36.6 C)  SpO2: 100%      Body mass index is 22.43 kg/m.    ECOG FS:1 - Symptomatic but completely ambulatory  Sclerae unicteric, EOMs intact Oropharynx clear and moist No cervical or supraclavicular adenopathy Lungs no rales or rhonchi Heart regular rate and rhythm Abd soft, nontender, positive bowel sounds MSK no focal spinal tenderness, no upper extremity lymphedema Neuro: nonfocal, well oriented, appropriate affect Breasts: The right breast is benign.  The left breast is status postlumpectomy and radiation.  There is no evidence of local recurrence.  Both axillae are benign.  LAB RESULTS:  CMP     Component  Value Date/Time   NA 141 03/07/2021 1547   NA 138 07/21/2019 1019   NA 143 05/21/2017 0959   K 4.8 03/07/2021 1547   K 4.0 05/21/2017 0959   CL 107 03/07/2021 1547   CO2 23 03/07/2021 1547   CO2 26 05/21/2017 0959   GLUCOSE 86 03/07/2021 1547   GLUCOSE 86 05/21/2017 0959   BUN 38 (H) 03/07/2021 1547   BUN 40 (H) 07/21/2019 1019  BUN 16.7 05/21/2017 0959   CREATININE 2.29 (H) 03/07/2021 1547   CREATININE 1.1 05/21/2017 0959   CALCIUM 11.7 (H) 03/07/2021 1547   CALCIUM 10.3 05/21/2017 0959   PROT 7.5 03/07/2021 1547   PROT 7.0 07/21/2019 1019   PROT 6.9 05/21/2017 0959   ALBUMIN 4.0 03/07/2021 1547   ALBUMIN 4.6 07/21/2019 1019   ALBUMIN 3.7 05/21/2017 0959   AST 24 03/07/2021 1547   AST 18 05/21/2017 0959   ALT 18 03/07/2021 1547   ALT 14 05/21/2017 0959   ALKPHOS 46 03/07/2021 1547   ALKPHOS 42 05/21/2017 0959   BILITOT 0.8 03/07/2021 1547   BILITOT 0.61 05/21/2017 0959   GFRNONAA 21 (L) 03/07/2021 1547   GFRNONAA 63 09/09/2014 1150   GFRAA 20 (L) 03/01/2020 1350   GFRAA 72 09/09/2014 1150    INo results found for: SPEP, UPEP  Lab Results  Component Value Date   WBC 5.1 03/07/2021   NEUTROABS 3.7 03/07/2021   HGB 10.4 (L) 03/07/2021   HCT 33.2 (L) 03/07/2021   MCV 99.1 03/07/2021   PLT 201 03/07/2021      Chemistry      Component Value Date/Time   NA 141 03/07/2021 1547   NA 138 07/21/2019 1019   NA 143 05/21/2017 0959   K 4.8 03/07/2021 1547   K 4.0 05/21/2017 0959   CL 107 03/07/2021 1547   CO2 23 03/07/2021 1547   CO2 26 05/21/2017 0959   BUN 38 (H) 03/07/2021 1547   BUN 40 (H) 07/21/2019 1019   BUN 16.7 05/21/2017 0959   CREATININE 2.29 (H) 03/07/2021 1547   CREATININE 1.1 05/21/2017 0959      Component Value Date/Time   CALCIUM 11.7 (H) 03/07/2021 1547   CALCIUM 10.3 05/21/2017 0959   ALKPHOS 46 03/07/2021 1547   ALKPHOS 42 05/21/2017 0959   AST 24 03/07/2021 1547   AST 18 05/21/2017 0959   ALT 18 03/07/2021 1547   ALT 14 05/21/2017  0959   BILITOT 0.8 03/07/2021 1547   BILITOT 0.61 05/21/2017 0959       No results found for: LABCA2  No components found for: LABCA125  No results for input(s): INR in the last 168 hours.  Urinalysis    Component Value Date/Time   COLORURINE YELLOW 05/09/2020 1047   APPEARANCEUR CLEAR 05/09/2020 1047   LABSPEC 1.012 05/09/2020 1047   PHURINE 5.0 05/09/2020 1047   GLUCOSEU NEGATIVE 05/09/2020 1047   HGBUR NEGATIVE 05/09/2020 1047   BILIRUBINUR NEGATIVE 05/09/2020 1047   BILIRUBINUR negative 05/15/2018 1032   BILIRUBINUR neg 09/14/2014 1308   KETONESUR NEGATIVE 05/09/2020 1047   PROTEINUR NEGATIVE 05/09/2020 1047   UROBILINOGEN 0.2 05/15/2018 1032   UROBILINOGEN 0.2 02/01/2011 1158   NITRITE NEGATIVE 05/09/2020 1047   LEUKOCYTESUR NEGATIVE 05/09/2020 1047    STUDIES: No results found.   ASSESSMENT: 86 y.o.  [place] woman status post left breast lower outer quadrant biopsy 10/05/2014 for a clinical T1 N0, stage IA invasive ductal breast cancer, grade 1, estrogen and progesterone receptor positive, HER-2 not amplified, with an MIB-1 of 5%   (1) left lumpectomy and sentinel lymph node sampling 10/22/2014 showed a pT1c pN0, stage IA mixed ductal and mucinous invasive carcinoma, grade 2, repeat HER-2 again negative  (a) positive margins were cleared with subsequent surgery 11/04/2014.  (2) radiation  12/21/2014-01/11/2015:   Left Breast / 42.56 Gy in 16 fractions   (3) anastrozole started September 2016, completing 5 years September 2021  (a) bone density 09/27/2014  shows osteoporosis  (b) denosumab/Prolia started 05/19/2015, to be repeated every 6 months.  (c) repeat bone density 09/28/2016 showed a T score of -3.3; this was stable    PLAN: [name] is now 6-1/2 years out from definitive surgery for her breast cancer with no evidence of disease recurrence.  This is very favorable.  I would be comfortable releasing her to her primary care physicians.  However she very much  wants to be followed here on a yearly basis.  She is a very good candidate for survivorship and I have entered a survivorship visit year from now.  In addition she is receiving Prolia and will have at least 1 more dose 6 months from now.  I have also entered those orders.  She knows to call for any other issue that may develop before then.  Total encounter time 25 minutes.*   Fantasia Jinkins, [name], MD  03/07/21 5:41 PM Medical Oncology and Hematology [facility] [address] [place], [place] [address] Tel. [phone]    Fax. [phone]   I, Mickie Bail, am acting as scribe for Dr. [name]. Amillia Biffle.  I, Ruthann Cancer MD, have reviewed the above documentation for accuracy and completeness, and I agree with the above.   *Total Encounter Time as defined by the Centers for Medicare and Medicaid Services includes, in addition to the face-to-face time of a patient visit (documented in the note above) non-face-to-face time: obtaining and reviewing outside history, ordering and reviewing medications, tests or procedures, care coordination (communications with other health care professionals or caregivers) and documentation in the medical record.

## 2021-03-07 NOTE — Progress Notes (Signed)
Patient is returning to lab to have cmet drawn and will reschedule her prolia injection

## 2021-03-08 ENCOUNTER — Inpatient Hospital Stay: Payer: Medicare Other

## 2021-03-08 VITALS — BP 123/72 | HR 79 | Temp 98.2°F | Resp 16

## 2021-03-08 DIAGNOSIS — M810 Age-related osteoporosis without current pathological fracture: Secondary | ICD-10-CM | POA: Diagnosis not present

## 2021-03-08 DIAGNOSIS — M818 Other osteoporosis without current pathological fracture: Secondary | ICD-10-CM

## 2021-03-08 MED ORDER — DENOSUMAB 60 MG/ML ~~LOC~~ SOSY
60.0000 mg | PREFILLED_SYRINGE | Freq: Once | SUBCUTANEOUS | Status: AC
  Administered 2021-03-08: 60 mg via SUBCUTANEOUS
  Filled 2021-03-08: qty 1

## 2021-03-10 ENCOUNTER — Telehealth: Payer: Self-pay | Admitting: Oncology

## 2021-03-10 NOTE — Telephone Encounter (Signed)
Scheduled per sch msg. Called and left msg. Mailed printout  

## 2021-03-21 ENCOUNTER — Other Ambulatory Visit: Payer: Self-pay | Admitting: Physician Assistant

## 2021-03-21 DIAGNOSIS — Z95818 Presence of other cardiac implants and grafts: Secondary | ICD-10-CM

## 2021-03-21 DIAGNOSIS — I340 Nonrheumatic mitral (valve) insufficiency: Secondary | ICD-10-CM

## 2021-03-21 DIAGNOSIS — Z9889 Other specified postprocedural states: Secondary | ICD-10-CM

## 2021-04-06 ENCOUNTER — Other Ambulatory Visit (HOSPITAL_COMMUNITY): Payer: Self-pay | Admitting: Internal Medicine

## 2021-04-19 ENCOUNTER — Other Ambulatory Visit (HOSPITAL_COMMUNITY): Payer: Self-pay | Admitting: Internal Medicine

## 2021-04-20 ENCOUNTER — Ambulatory Visit: Payer: Medicare Other | Admitting: Physician Assistant

## 2021-04-20 ENCOUNTER — Encounter: Payer: Self-pay | Admitting: Physician Assistant

## 2021-04-20 ENCOUNTER — Ambulatory Visit (HOSPITAL_COMMUNITY): Payer: Medicare Other | Attending: Cardiology

## 2021-04-20 ENCOUNTER — Other Ambulatory Visit: Payer: Self-pay

## 2021-04-20 VITALS — BP 132/84 | HR 66 | Ht 68.0 in | Wt 148.6 lb

## 2021-04-20 DIAGNOSIS — I340 Nonrheumatic mitral (valve) insufficiency: Secondary | ICD-10-CM | POA: Diagnosis not present

## 2021-04-20 DIAGNOSIS — Z9889 Other specified postprocedural states: Secondary | ICD-10-CM

## 2021-04-20 DIAGNOSIS — Z95818 Presence of other cardiac implants and grafts: Secondary | ICD-10-CM | POA: Diagnosis not present

## 2021-04-20 DIAGNOSIS — I5022 Chronic systolic (congestive) heart failure: Secondary | ICD-10-CM | POA: Diagnosis not present

## 2021-04-20 DIAGNOSIS — I4819 Other persistent atrial fibrillation: Secondary | ICD-10-CM | POA: Diagnosis not present

## 2021-04-20 DIAGNOSIS — N1832 Chronic kidney disease, stage 3b: Secondary | ICD-10-CM | POA: Diagnosis not present

## 2021-04-20 DIAGNOSIS — R5382 Chronic fatigue, unspecified: Secondary | ICD-10-CM

## 2021-04-20 LAB — ECHOCARDIOGRAM COMPLETE
Area-P 1/2: 2.39 cm2
MV M vel: 4.56 m/s
MV Peak grad: 83.2 mmHg
MV VTI: 0.98 cm2
P 1/2 time: 563 msec
Radius: 0.7 cm
S' Lateral: 4.1 cm

## 2021-04-20 NOTE — Progress Notes (Signed)
[facility]                                       Cardiology Office Note    Date:  04/21/2021   ID:  ADRINNE SZE, DOB [birth year], MRN [id number]  PCP:  Forrest Moron, MD  Cardiologist:  Dr. Haroldine Laws  CC: 1 month s/p Mitralclip  History of Present Illness:  ONEISHA AMMONS is a 86 y.o. female with a history of HTN, breast cancer, CAD s/p anterior STEMI 2/19, permanent afib on Eliquis, chronic systolic HF, CKD stage IV, and severe non rheumatic mitral valve regurgitation s/p TEER (05/12/20) who presents to clinic for follow up.   The patient suffered an acute myocardial infarction of the anterolateral wall in 2019. At that time she was noted to have multivessel coronary artery disease, but a large ramus intermedius branch was found to be the culprit vessel.  This vessel was totally occluded and was treated with overlapping drug-eluting stents. There were recommendations to treat her residual coronary disease medically. She was noted to have moderately severe LV systolic dysfunction at the time of her presentation with an LVEF of 35 to 40%.  The patient initially was noted to have moderate mitral regurgitation, but on serial echo studies she has been found to have progressive LV dilatation and worsening mitral regurgitation. Her most recent echocardiogram in October 2021 demonstrated mild to moderate LV dysfunction with an LVEF of 40 to 45% with severe hypokinesis of the anterolateral wall and inferolateral walls and severe secondary mitral regurgitation related to posterior leaflet tethering with an ER O of 0.4 cm, regurgitant fraction of 50%, and regurgitant volume of 60 mL.  She was also demonstrated to have moderate tricuspid regurgitation and mild aortic valve regurgitation.   She was evaluated by the multidisciplinary valve team and underwent successful transcatheter edge-to-edge repair using 2 MitraClip NTW devices, positioned on the  medial and lateral sides of A2/P2 (first clip medial, second clip lateral), reducing MR severity from 4+ to 2+. Post op echo showed EF 45-50%, with moderate residual MR and mean gradient of 4.50mm Hg. She was resumed on home Eliquis with the addition of a baby aspirin 81mg  x 3 months daily. She has been doing well in follow up with some improvement in symptoms but mostly complained of fatigue. 1 month echo showed EF 40-45%, with mild MR with a mean gradient of 3 mm Hg and moderate TR. She has been doing well and last seen by Dr. Haroldine Laws in 07/2020.  Today she presents to clinic for follow up. Here with son. Doing well. Has stayed busy working in her garden and around the house. She has been more limited recently given the cold weather. She likes to watch TV. No CP or SOB. No LE edema, orthopnea or PND. No dizziness or syncope. No blood in stool or urine. No palpitations. Her son notes that she does complain of being tired most afternoons. She reports sleeping well.    Past Medical History:  Diagnosis Date   Anemia    Anxiety    Atrial fibrillation ([place])    Breast cancer of lower-outer quadrant of left female breast ([place]) 10/11/2014   treated with lumpectomy and radiation   Chronic systolic CHF (congestive heart failure) (H[facility])    CKD (chronic kidney disease)    Coronary artery disease 07/27/2017  GERD (gastroesophageal reflux disease)    Hypertension    Mitral regurgitation    Myocardial infarction ([place]) 07/27/2017   STEMI of anterolateral wall. Cath w/ stent and cabg by Dr. Martinique   Osteoporosis 10/20/2014   Personal history of radiation therapy 2016   treatment of breast cancer   PONV (postoperative nausea and vomiting)    pt denies this.   PUD (peptic ulcer disease)    Pure hypercholesterolemia 04/08/2014   S/P mitral valve clip implantation 05/12/2020   s/p TEER with two NTW Mitraclip devices by Dr. Clare Charon, kidney     Past Surgical History:  Procedure Laterality Date    BREAST LUMPECTOMY Left 2016   BREAST LUMPECTOMY WITH SENTINEL LYMPH NODE BIOPSY Left 10/22/2014   Procedure: BREAST LUMPECTOMY WITH SENTINEL LYMPH NODE BIOPSY;  Surgeon: Autumn Messing III, MD;  Location: [facility];  Service: General;  Laterality: Left;   CARDIAC CATHETERIZATION     CARDIOVERSION N/A 06/13/2018   Procedure: CARDIOVERSION;  Surgeon: Jolaine Artist, MD;  Location: [facility] ENDOSCOPY;  Service: Cardiovascular;  Laterality: N/A;   CORONARY STENT INTERVENTION N/A 07/27/2017   Procedure: CORONARY STENT INTERVENTION;  Surgeon: Martinique, Peter M, MD;  Location: [facility] CV LAB;  Service: Cardiovascular;  Laterality: N/A;   CORONARY/GRAFT ACUTE MI REVASCULARIZATION N/A 07/27/2017   Procedure: Coronary/Graft Acute MI Revascularization;  Surgeon: Martinique, Peter M, MD;  Location: [facility];  Service: Cardiovascular;  Laterality: N/A;   LEFT HEART CATH AND CORONARY ANGIOGRAPHY N/A 07/27/2017   Procedure: LEFT HEART CATH AND CORONARY ANGIOGRAPHY;  Surgeon: Martinique, Peter M, MD;  Location: [facility];  Service: Cardiovascular;  Laterality: N/A;   MITRAL VALVE REPAIR N/A 05/12/2020   Procedure: MITRAL VALVE REPAIR;  Surgeon: Sherren Mocha, MD;  Location: [facility];  Service: Cardiovascular;  Laterality: N/A;   RE-EXCISION OF BREAST CANCER,SUPERIOR MARGINS Left 11/04/2014   Procedure: RE-EXCISION OF LEFT BREAST INFERIOR MARGINS;  Surgeon: Autumn Messing III, MD;  Location: [facility];  Service: General;  Laterality: Left;   RIGHT HEART CATH N/A 04/05/2020   Procedure: RIGHT HEART CATH;  Surgeon: Jolaine Artist, MD;  Location: [facility];  Service: Cardiovascular;  Laterality: N/A;   TEE WITHOUT CARDIOVERSION N/A 04/05/2020   Procedure: TRANSESOPHAGEAL ECHOCARDIOGRAM (TEE);  Surgeon: Jolaine Artist, MD;  Location: [facility] ENDOSCOPY;  Service: Cardiovascular;  Laterality: N/A;   TEE WITHOUT CARDIOVERSION N/A 05/12/2020   Procedure: TRANSESOPHAGEAL ECHOCARDIOGRAM (TEE);   Surgeon: Sherren Mocha, MD;  Location: [facility] LAB;  Service: Cardiovascular;  Laterality: N/A;    Current Medications: Outpatient Medications Prior to Visit  Medication Sig Dispense Refill   acetaminophen (TYLENOL) 500 MG tablet Take 500-1,000 mg by mouth every 6 (six) hours as needed (for pain.).     amoxicillin (AMOXIL) 500 MG tablet Take 4 tablets (2,000 mg total) 1 hour prior to all dental visits. 8 tablet 11   atorvastatin (LIPITOR) 80 MG tablet Take 1 tablet (80 mg total) by mouth daily. Needs appt 90 tablet 0   B-12, Methylcobalamin, 1000 MCG SUBL Take 1,000 mcg by mouth daily.     Cholecalciferol (VITAMIN D) 125 MCG (5000 UT) CAPS Take by mouth in the morning and at bedtime.     co-enzyme Q-10 30 MG capsule Take 30 mg by mouth 3 (three) times daily.     ferrous sulfate 325 (65 FE) MG tablet Take 325 mg by mouth daily.     furosemide (  LASIX) 20 MG tablet TAKE ONE TABLET BY MOUTH EVERY OTHER DAY ALTERNATING 2 TABLETS EVERY OTHER DAY 135 tablet 3   losartan (COZAAR) 25 MG tablet Take 1 tablet (25 mg total) by mouth in the morning and at bedtime. 180 tablet 3   Magnesium 200 MG CHEW Chew 400 mg by mouth daily.     Multiple Vitamin (MULTIVITAMIN WITH MINERALS) TABS tablet Take 1 tablet by mouth daily.     nitroGLYCERIN (NITROSTAT) 0.3 MG SL tablet DISSOLVE ONE TABLET UNDER THE TONGUE EVERY 5 MINUTES AS NEEDED FOR CHEST PAIN.  DO NOT EXCEED A TOTAL OF 3 DOSES IN 15 MINUTES NOW 100 tablet 0   Omega-3 Fatty Acids (FISH OIL PO) Take 1,600 mg by mouth at bedtime.      spironolactone (ALDACTONE) 25 MG tablet Take 1 tablet by mouth once daily 90 tablet 3   ELIQUIS 2.5 MG TABS tablet Take 1 tablet by mouth twice daily 60 tablet 6   No facility-administered medications prior to visit.     Allergies:   Lisinopril   Social History   Socioeconomic History   Marital status: Widowed    Spouse name: Not on file   Number of children: Not on file   Years of education: Not on file    Highest education level: Not on file  Occupational History   Occupation: retired  Tobacco Use   Smoking status: Never   Smokeless tobacco: Never  Vaping Use   Vaping Use: Never used  Substance and Sexual Activity   Alcohol use: No   Drug use: No   Sexual activity: Not on file  Other Topics Concern   Not on file  Social History Narrative   Not on file   Social Determinants of Health   Financial Resource Strain: Not on file  Food Insecurity: Not on file  Transportation Needs: Not on file  Physical Activity: Not on file  Stress: Not on file  Social Connections: Not on file     Family History:  The patient's family history includes Heart disease in her brother; Hypertension in her brother, brother, sister, and sister; Pancreatic cancer in her mother; Stroke in her father.     ROS:   Please see the history of present illness.    ROS All other systems reviewed and are negative.   PHYSICAL EXAM:   VS:  BP 132/84   Pulse 66   Ht 5\' 8"  (1.727 m)   Wt 148 lb 9.6 oz (67.4 kg)   LMP  (LMP Unknown)   SpO2 100%   BMI 22.59 kg/m    GEN: Well nourished, well developed, in no acute distress HEENT: normal Neck: no JVD or masses Cardiac: irreg irreg; soft murmur @ LUSB. No rubs, or gallops,no edema  Respiratory:  clear to auscultation bilaterally, normal work of breathing GI: soft, nontender, nondistended, + BS MS: no deformity or atrophy Skin: warm and dry, no rash.  Groin site clear without hematoma or ecchymosis  Neuro:  Alert and Oriented x 3, Strength and sensation are intact Psych: euthymic mood, full affect   Wt Readings from Last 3 Encounters:  04/20/21 148 lb 9.6 oz (67.4 kg)  03/07/21 147 lb 8 oz (66.9 kg)  07/20/20 141 lb 3.2 oz (64 kg)      Studies/Labs Reviewed:   EKG:  EKG is not ordered today.    Recent Labs: 07/20/2020: B Natriuretic Peptide 552.9 03/07/2021: ALT 18; BUN 38; Creatinine 2.29; Hemoglobin 10.4; Platelets 201; Potassium 4.8;  Sodium  141 04/20/2021: TSH 1.620   Lipid Panel    Component Value Date/Time   CHOL 149 07/20/2020 1210   CHOL 156 01/10/2018 1000   TRIG 38 07/20/2020 1210   HDL 80 07/20/2020 1210   HDL 74 01/10/2018 1000   CHOLHDL 1.9 07/20/2020 1210   VLDL 8 07/20/2020 1210   LDLCALC 61 07/20/2020 1210   LDLCALC 70 01/10/2018 1000    Additional studies/ records that were reviewed today include:  05/12/20 MITRAL VALVE REPAIR  Conclusion Successful transcatheter edge-to-edge repair using 2 MitraClip NTW devices, positioned on the medial and lateral sides of A2/P2 (first clip medial, second clip lateral), reducing MR severity from 4+ to 2+   Transmitral mean gradient 3 mmHg pre-procedure and 4 mmHg post-procedure   LA pressure V wave 66 mmHg baseline, 27 mmHg post-repair   _____________________  Echo 05/14/20 IMPRESSIONS   1. Day 1 post 2 Mitraclip placements, there is residual moderate mitral  regurgitation and mean transmitral gradient of 4.5 mmHg at 71 BPM.   2. Left ventricular ejection fraction, by estimation, is 45 to 50%. The  left ventricle has mildly decreased function. The left ventricle has no  regional wall motion abnormalities. Left ventricular diastolic function  could not be evaluated.   3. Right ventricular systolic function is normal. The right ventricular  size is normal. There is moderately elevated pulmonary artery systolic  pressure. The estimated right ventricular systolic pressure is 94.7 mmHg.   4. Left atrial size was severely dilated.   5. Right atrial size was mildly dilated.   6. The mitral valve is normal in structure. Moderate mitral valve  regurgitation. No evidence of mitral stenosis. There is a NTW MitralClip  present I/n the mitral position. Procedure Date: 05/12/2020.   7. Tricuspid valve regurgitation is moderate to severe.   8. The aortic valve is normal in structure. Aortic valve regurgitation is  mild. No aortic stenosis is present.   9. The inferior vena  cava is dilated in size with <50% respiratory  variability, suggesting right atrial pressure of 15 mmHg.   ___________________  Echo 06/16/19 IMPRESSIONS  1. Abnormal septal motion . Left ventricular ejection fraction, by  estimation, is 40 to 45%. The left ventricle has mildly decreased  function. The left ventricle demonstrates global hypokinesis. There is  moderate left ventricular hypertrophy. Left  ventricular diastolic parameters are indeterminate.   2. Right ventricular systolic function is normal. The right ventricular  size is normal.   3. Left atrial size was severely dilated.   4. Persistent left to right shunt across trans septal puncture site .  Shunt.   5. Right atrial size was mildly dilated.   6. Post mitral clip using two NTW devices across A2/P2 mild residual MR  improved compared to previous. No MS mean gradient 3 mmHg MVA PT1/2 2.8  cm2. The mitral valve has been repaired/replaced. Mild mitral valve  regurgitation. No evidence of mitral  stenosis.   7. Tricuspid valve regurgitation is moderate.   8. The aortic valve is tricuspid. Aortic valve regurgitation is mild.  Mild aortic valve sclerosis is present, with no evidence of aortic valve  stenosis.   9. The inferior vena cava is normal in size with greater than 50%  respiratory variability, suggesting right atrial pressure of 3 mmHg.   ________________________  Echo 04/20/21 IMPRESSIONS  1. Left ventricular ejection fraction, by estimation, is 40 to 45%. The left ventricle has mildly decreased function. The left ventricle demonstrates global hypokinesis.  The left ventricular internal cavity size was mildly dilated. There is mild left  ventricular hypertrophy. Left ventricular diastolic parameters are indeterminate.  2. Right ventricular systolic function is mildly reduced. The right ventricular size is normal. There is moderately elevated pulmonary artery systolic pressure. The estimated right ventricular  systolic pressure is 16.1 mmHg.  3. Status post Mitraclip x 2, A2/P2 position. Moderate mitral regurgitation. Mean gradient 5 mmHg.  4. The aortic valve is tricuspid. Aortic valve regurgitation is mild. No aortic stenosis is present.  5. Aortic dilatation noted. There is borderline dilatation of the ascending aorta, measuring 36 mm.  6. Tricuspid valve regurgitation is moderate.  7. Right atrial size was mildly dilated.  8. Left atrial size was severely dilated.  9. Small residual ASD likely from trans-septal puncture. 10. The inferior vena cava is normal in size with greater than 50% respiratory variability, suggesting right atrial pressure of 3 mmHg.  ASSESSMENT & PLAN:   Severe MR s/p TEER: echo today shows EF 40-45%, s/p Mitraclip x 2, A2/P2 position with moderate mitral regurgitation and mean gradient 5 mmHg as well as moderate pulm HTN. She has NYHA class II symptoms, mostly of fatigue. She will continue on Eliquis 2.5mg  BId.   Chronic systolic CHF: appears euvolemic. No changes made.   Permanent afib: on appropriately dosed Eliquis. Rate controlled on no AV nodal blocking agents.    CKD stage IIIb: creat 2.29 last month.   Fatigue: wonders what could be causing periodic fatigue.I did not see any medications that could cause fatigue, BP/HR normal, had recent CBC/BMET, hg 10.4, echo with stable MR/TR/EF 40-45%. I checked a TSH, which came back normal. She is going to try to start some light exercise.    Medication Adjustments/Labs and Tests Ordered: Current medicines are reviewed at length with the patient today.  Concerns regarding medicines are outlined above.  Medication changes, Labs and Tests ordered today are listed in the Patient Instructions below. Patient Instructions  Medication Instructions:  Your physician recommends that you continue on your current medications as directed. Please refer to the Current Medication list given to you today.  *If you need a refill on your  cardiac medications before your next appointment, please call your pharmacy*   Lab Work: Tsh- Today   If you have labs (blood work) drawn today and your tests are completely normal, you will receive your results only by: [place] (if you have MyChart) OR A paper copy in the mail If you have any lab test that is abnormal or we need to change your treatment, we will call you to review the results.   Testing/Procedures: None ordered    Follow-Up: Follow up with Dr. Glori Bickers   Other Instructions None      Signed, Angelena Form, PA-C  04/21/2021 10:08 AM    [facility] [address], [place], [place]  [address] Phone: [phone]; Fax: [phone]

## 2021-04-20 NOTE — Patient Instructions (Signed)
Medication Instructions:  Your physician recommends that you continue on your current medications as directed. Please refer to the Current Medication list given to you today.  *If you need a refill on your cardiac medications before your next appointment, please call your pharmacy*   Lab Work: Tsh- Today   If you have labs (blood work) drawn today and your tests are completely normal, you will receive your results only by: [place] (if you have MyChart) OR A paper copy in the mail If you have any lab test that is abnormal or we need to change your treatment, we will call you to review the results.   Testing/Procedures: None ordered    Follow-Up: Follow up with Dr. Glori Bickers   Other Instructions None

## 2021-04-21 LAB — TSH: TSH: 1.62 u[IU]/mL (ref 0.450–4.500)

## 2021-05-12 ENCOUNTER — Ambulatory Visit
Admission: RE | Admit: 2021-05-12 | Discharge: 2021-05-12 | Disposition: A | Discharge status: Home / Self Care | Payer: Medicare Other | Source: Ambulatory Visit | Attending: Oncology | Admitting: Oncology

## 2021-05-12 DIAGNOSIS — C50512 Malignant neoplasm of lower-outer quadrant of left female breast: Secondary | ICD-10-CM

## 2021-05-12 DIAGNOSIS — Z170 Estrogen receptor positive status [ER+]: Secondary | ICD-10-CM

## 2021-05-12 DIAGNOSIS — M818 Other osteoporosis without current pathological fracture: Secondary | ICD-10-CM

## 2021-05-22 ENCOUNTER — Other Ambulatory Visit (HOSPITAL_COMMUNITY): Payer: Self-pay | Admitting: Internal Medicine

## 2021-06-24 ENCOUNTER — Other Ambulatory Visit (HOSPITAL_COMMUNITY): Payer: Self-pay | Admitting: Internal Medicine

## 2021-06-28 ENCOUNTER — Encounter (HOSPITAL_COMMUNITY): Payer: Self-pay | Admitting: Internal Medicine

## 2021-06-28 ENCOUNTER — Other Ambulatory Visit: Payer: Self-pay

## 2021-06-28 ENCOUNTER — Ambulatory Visit (HOSPITAL_COMMUNITY)
Admission: RE | Admit: 2021-06-28 | Discharge: 2021-06-28 | Disposition: A | Discharge status: Home / Self Care | Payer: Medicare Other | Source: Ambulatory Visit | Attending: Internal Medicine | Admitting: Internal Medicine

## 2021-06-28 VITALS — BP 130/70 | HR 80 | Wt 147.6 lb

## 2021-06-28 DIAGNOSIS — I2510 Atherosclerotic heart disease of native coronary artery without angina pectoris: Secondary | ICD-10-CM | POA: Insufficient documentation

## 2021-06-28 DIAGNOSIS — Z8249 Family history of ischemic heart disease and other diseases of the circulatory system: Secondary | ICD-10-CM | POA: Insufficient documentation

## 2021-06-28 DIAGNOSIS — I4819 Other persistent atrial fibrillation: Secondary | ICD-10-CM

## 2021-06-28 DIAGNOSIS — N1832 Chronic kidney disease, stage 3b: Secondary | ICD-10-CM

## 2021-06-28 DIAGNOSIS — I252 Old myocardial infarction: Secondary | ICD-10-CM | POA: Diagnosis not present

## 2021-06-28 DIAGNOSIS — Z7901 Long term (current) use of anticoagulants: Secondary | ICD-10-CM | POA: Insufficient documentation

## 2021-06-28 DIAGNOSIS — I340 Nonrheumatic mitral (valve) insufficiency: Secondary | ICD-10-CM | POA: Diagnosis not present

## 2021-06-28 DIAGNOSIS — Z79899 Other long term (current) drug therapy: Secondary | ICD-10-CM | POA: Insufficient documentation

## 2021-06-28 DIAGNOSIS — Z95818 Presence of other cardiac implants and grafts: Secondary | ICD-10-CM

## 2021-06-28 DIAGNOSIS — Z955 Presence of coronary angioplasty implant and graft: Secondary | ICD-10-CM | POA: Diagnosis not present

## 2021-06-28 DIAGNOSIS — I4821 Permanent atrial fibrillation: Secondary | ICD-10-CM | POA: Diagnosis not present

## 2021-06-28 DIAGNOSIS — Z9889 Other specified postprocedural states: Secondary | ICD-10-CM | POA: Diagnosis not present

## 2021-06-28 DIAGNOSIS — Z951 Presence of aortocoronary bypass graft: Secondary | ICD-10-CM | POA: Diagnosis not present

## 2021-06-28 DIAGNOSIS — I130 Hypertensive heart and chronic kidney disease with heart failure and stage 1 through stage 4 chronic kidney disease, or unspecified chronic kidney disease: Secondary | ICD-10-CM | POA: Diagnosis not present

## 2021-06-28 DIAGNOSIS — I5022 Chronic systolic (congestive) heart failure: Secondary | ICD-10-CM | POA: Diagnosis not present

## 2021-06-28 LAB — COMPREHENSIVE METABOLIC PANEL
ALT: 26 U/L (ref 0–44)
AST: 30 U/L (ref 15–41)
Albumin: 4.2 g/dL (ref 3.5–5.0)
Alkaline Phosphatase: 41 U/L (ref 38–126)
Anion gap: 12 (ref 5–15)
BUN: 57 mg/dL — ABNORMAL HIGH (ref 8–23)
CO2: 20 mmol/L — ABNORMAL LOW (ref 22–32)
Calcium: 11.1 mg/dL — ABNORMAL HIGH (ref 8.9–10.3)
Chloride: 105 mmol/L (ref 98–111)
Creatinine, Ser: 2.26 mg/dL — ABNORMAL HIGH (ref 0.44–1.00)
GFR, Estimated: 21 mL/min — ABNORMAL LOW (ref >60)
Glucose, Bld: 130 mg/dL — ABNORMAL HIGH (ref 70–99)
Potassium: 4.5 mmol/L (ref 3.5–5.1)
Sodium: 137 mmol/L (ref 135–145)
Total Bilirubin: 0.6 mg/dL (ref 0.3–1.2)
Total Protein: 7.3 g/dL (ref 6.5–8.1)

## 2021-06-28 LAB — CBC
HCT: 33.6 % — ABNORMAL LOW (ref 36.0–46.0)
Hemoglobin: 10.8 g/dL — ABNORMAL LOW (ref 12.0–15.0)
MCH: 31.3 pg (ref 26.0–34.0)
MCHC: 32.1 g/dL (ref 30.0–36.0)
MCV: 97.4 fL (ref 80.0–100.0)
Platelets: 221 10*3/uL (ref 150–400)
RBC: 3.45 MIL/uL — ABNORMAL LOW (ref 3.87–5.11)
RDW: 12.8 % (ref 11.5–15.5)
WBC: 5.1 10*3/uL (ref 4.0–10.5)
nRBC: 0 % (ref 0.0–0.2)

## 2021-06-28 LAB — BRAIN NATRIURETIC PEPTIDE: B Natriuretic Peptide: 375.4 pg/mL — ABNORMAL HIGH (ref 0.0–100.0)

## 2021-06-28 NOTE — Patient Instructions (Signed)
No medication  changes were made today  Labs done today, your results will be available in MyChart, we will contact you for abnormal readings.   Your physician recommends that you schedule a follow-up appointment in: 6 months.Call office in May to schedule an appointment for July  If you have any questions or concerns before your next appointment please send [place] a message through [facility] or call our office at [phone].    TO LEAVE A MESSAGE FOR THE NURSE SELECT OPTION 2, PLEASE LEAVE A MESSAGE INCLUDING: YOUR NAME DATE OF BIRTH CALL BACK NUMBER REASON FOR CALL**this is important as we prioritize the call backs  YOU WILL RECEIVE A CALL BACK THE SAME DAY AS LONG AS YOU CALL BEFORE 4:00 PM  At the [facility], you and your health needs are our priority. As part of our continuing mission to provide you with exceptional heart care, we have created designated Provider Care Teams. These Care Teams include your primary Cardiologist (physician) and Advanced Practice Providers (APPs- Physician Assistants and Nurse Practitioners) who all work together to provide you with the care you need, when you need it.   You may see any of the following providers on your designated Care Team at your next follow up: Dr Glori Bickers Dr Haynes Kerns, NP Lyda Jester, [place] [facility] [place], [place] Audry Riles, PharmD   Please be sure to bring in all your medications bottles to every appointment.   Do the following things EVERYDAY: Weigh yourself in the morning before breakfast. Write it down and keep it in a log. Take your medicines as prescribed Eat low salt foods--Limit salt (sodium) to 2000 mg per day.  Stay as active as you can everyday Limit all fluids for the day to less than 2 liters

## 2021-06-28 NOTE — Progress Notes (Addendum)
Advanced Heart Failure Clinic Note   Date:  06/28/2021   ID:  [name], DOB April 17, 1935, MRN [id number]  Location: Home  Provider location: [facility] Type of Visit: Established patient  PCP:  [name], MD  Cardiologist:  None Primary HF: [name]  Chief Complaint: Heart Failure follow-up   History of Present Illness:  [name] is a 87 y.o. female  with h/o HTN, breast cancer, CAD s/p anterior STEMI 2/19, chronic AF and systolic HF.    Admitted to [facility]  ED 07/27/17 with CP. Hospital course was complicated by acute respiratory failure, bradycardic arrest, Afib RVR, and cardiogenic shock in the setting of anterior MI.  Found to have acute anterior MI and taken urgently to the cath lab. LHC 07/27/17 as below with 3v CAD, LVEF 35-40%, elevated LVEDP. Underwent PCI with successful stenting of the ramus intermediate artery with DES x 2.. As she improved she was extubated on 2/24. An ECHO was completed an showed LVEF 40-45%.      On 06/13/18 she underwent cardioversion for recurrent A fib. She returned to the A fib clinic on 06/19/18 and was back in A Fib. Zio patch to assess AF rate = continuous AF with average rate 84. (was not on carvedilol at that time)   Echo 10/21 EF 40-45% severe MR. Mod TR. Mild AI.   Underwent MitraClip 05/12/20. Post op echo showed EF 45-50%, with moderate residual MR and mean gradient of 4.82mm Hg.   Echo 11/22: EF 40-45%, RV mildly reduced, RVSP 50 mmHg, moderate residual MR with mean gradient 5 mmHg.  Here for follow-up. Says she is more SOB during cold weather. Does all activities without too much problem. No edema, orthopnea or PND. No CP.   Cath results:   [facility] 07/27/2017  Post Atrio lesion is 90% stenosed. Prox LAD to Dist LAD lesion is 50% stenosed. Ost 1st Diag lesion is 95% stenosed. Ost 2nd Diag to 2nd Diag lesion is 90% stenosed. Mid Cx lesion is 30% stenosed. Ost Ramus to Ramus lesion is 100%  stenosed. A drug-eluting stent was successfully placed using a STENT SYNERGY DES 2.75X28. A drug-eluting stent was successfully placed using a STENT SYNERGY DES 2.5X12. Post intervention, there is a 0% residual stenosis. There is moderate to severe left ventricular systolic dysfunction. LV end diastolic pressure is moderately elevated. The left ventricular ejection fraction is 35-45% by visual estimate. 1. 3 vessel obstructive CAD    - 100% ramus intermediate artery. This is a large vessel and is the culprit lesion.    - 95% small first diagonal    - 90% small second diagonal     - diffusely diseased LAD    - 90% PLOM    Past Medical History:  Diagnosis Date   Anemia    Anxiety    Atrial fibrillation (HCC)    Breast cancer of lower-outer quadrant of left female breast (HCC) 10/11/2014   treated with lumpectomy and radiation   Chronic systolic CHF (congestive heart failure) (HCC)    CKD (chronic kidney disease)    Coronary artery disease 07/27/2017   GERD (gastroesophageal reflux disease)    Hypertension    Mitral regurgitation    Myocardial infarction (HCC) 07/27/2017   STEMI of anterolateral wall. Cath w/ stent and cabg by Dr. Swaziland   Osteoporosis 10/20/2014   Personal history of radiation therapy 2016   treatment of breast cancer   PONV (postoperative nausea and  vomiting)    pt denies this.   PUD (peptic ulcer disease)    Pure hypercholesterolemia 04/08/2014   S/P mitral valve clip implantation 05/12/2020   s/p TEER with two NTW Mitraclip devices by Dr. Torrie Mayers, kidney    Past Surgical History:  Procedure Laterality Date   BREAST LUMPECTOMY Left 2016   BREAST LUMPECTOMY WITH SENTINEL LYMPH NODE BIOPSY Left 10/22/2014   Procedure: BREAST LUMPECTOMY WITH SENTINEL LYMPH NODE BIOPSY;  Surgeon: Chevis Pretty III, MD;  Location: [facility];  Service: General;  Laterality: Left;   CARDIAC CATHETERIZATION     CARDIOVERSION N/A 06/13/2018   Procedure:  CARDIOVERSION;  Surgeon: Dolores Patty, MD;  Location: [facility] ENDOSCOPY;  Service: Cardiovascular;  Laterality: N/A;   CORONARY STENT INTERVENTION N/A 07/27/2017   Procedure: CORONARY STENT INTERVENTION;  Surgeon: Swaziland, Peter M, MD;  Location: [facility] INVASIVE CV LAB;  Service: Cardiovascular;  Laterality: N/A;   CORONARY/GRAFT ACUTE MI REVASCULARIZATION N/A 07/27/2017   Procedure: Coronary/Graft Acute MI Revascularization;  Surgeon: Swaziland, Peter M, MD;  Location: [facility] INVASIVE CV LAB;  Service: Cardiovascular;  Laterality: N/A;   LEFT HEART CATH AND CORONARY ANGIOGRAPHY N/A 07/27/2017   Procedure: LEFT HEART CATH AND CORONARY ANGIOGRAPHY;  Surgeon: Swaziland, Peter M, MD;  Location: [facility] INVASIVE CV LAB;  Service: Cardiovascular;  Laterality: N/A;   MITRAL VALVE REPAIR N/A 05/12/2020   Procedure: MITRAL VALVE REPAIR;  Surgeon: Tonny Bollman, MD;  Location: [facility] INVASIVE CV LAB;  Service: Cardiovascular;  Laterality: N/A;   RE-EXCISION OF BREAST CANCER,SUPERIOR MARGINS Left 11/04/2014   Procedure: RE-EXCISION OF LEFT BREAST INFERIOR MARGINS;  Surgeon: Chevis Pretty III, MD;  Location: [facility] OR;  Service: General;  Laterality: Left;   RIGHT HEART CATH N/A 04/05/2020   Procedure: RIGHT HEART CATH;  Surgeon: Dolores Patty, MD;  Location: [facility] INVASIVE CV LAB;  Service: Cardiovascular;  Laterality: N/A;   TEE WITHOUT CARDIOVERSION N/A 04/05/2020   Procedure: TRANSESOPHAGEAL ECHOCARDIOGRAM (TEE);  Surgeon: Dolores Patty, MD;  Location: [facility] ENDOSCOPY;  Service: Cardiovascular;  Laterality: N/A;   TEE WITHOUT CARDIOVERSION N/A 05/12/2020   Procedure: TRANSESOPHAGEAL ECHOCARDIOGRAM (TEE);  Surgeon: Tonny Bollman, MD;  Location: [facility] INVASIVE CV LAB;  Service: Cardiovascular;  Laterality: N/A;     Current Outpatient Medications  Medication Sig Dispense Refill   acetaminophen (TYLENOL) 500 MG tablet Take 500-1,000 mg by mouth every 6 (six) hours as needed (for pain.).     amoxicillin (AMOXIL) 500 MG tablet Take 4  tablets (2,000 mg total) 1 hour prior to all dental visits. 8 tablet 11   atorvastatin (LIPITOR) 80 MG tablet Take 1 tablet (80 mg total) by mouth daily. Needs appt 90 tablet 0   B-12, Methylcobalamin, 1000 MCG SUBL Take 1,000 mcg by mouth daily.     Cholecalciferol (VITAMIN D) 125 MCG (5000 UT) CAPS Take by mouth in the morning and at bedtime.     Co-Enzyme Q10 100 MG CAPS Take by mouth 2 (two) times daily.     ELIQUIS 2.5 MG TABS tablet TAKE 1 TABLET BY MOUTH TWICE DAILY . APPOINTMENT REQUIRED FOR FUTURE REFILLS 30 tablet 0   ferrous sulfate 325 (65 FE) MG tablet Take 325 mg by mouth daily.     furosemide (LASIX) 40 MG tablet Take 40 mg by mouth as needed.     losartan (COZAAR) 25 MG tablet Take 1 tablet (25 mg total) by mouth in the morning and at bedtime. 180 tablet 3   Magnesium 200  MG CHEW Chew 400 mg by mouth daily.     Multiple Vitamin (MULTIVITAMIN WITH MINERALS) TABS tablet Take 1 tablet by mouth daily.     nitroGLYCERIN (NITROSTAT) 0.3 MG SL tablet DISSOLVE ONE TABLET UNDER THE TONGUE EVERY 5 MINUTES AS NEEDED FOR CHEST PAIN.  DO NOT EXCEED A TOTAL OF 3 DOSES IN 15 MINUTES NOW 100 tablet 0   Omega-3 Fatty Acids (FISH OIL PO) Take 1,600 mg by mouth at bedtime.      spironolactone (ALDACTONE) 25 MG tablet Take 1 tablet by mouth once daily 90 tablet 3   No current facility-administered medications for this encounter.    Allergies:   Lisinopril   Social History:  The patient  reports that she has never smoked. She has never used smokeless tobacco. She reports that she does not drink alcohol and does not use drugs.   Family History:  The patient's family history includes Heart disease in her brother; Hypertension in her brother, brother, sister, and sister; Pancreatic cancer in her mother; Stroke in her father.   ROS:  Please see the history of present illness.   All other systems are personally reviewed and negative.   Vitals:   06/28/21 1521  BP: 130/70  Pulse: 80  SpO2: 100%   Weight: 67 kg (147 lb 9.6 oz)    Exam:   General:  Well appearing. No resp difficulty HEENT: normal Neck: supple. no JVD. Carotids 2+ bilat; no bruits. No lymphadenopathy or thryomegaly appreciated. Cor: PMI nondisplaced. Irregular rate & rhythm. 2/6 MR Lungs: clear Abdomen: soft, nontender, nondistended. No hepatosplenomegaly. No bruits or masses. Good bowel sounds. Extremities: no cyanosis, clubbing, rash, edema Neuro: alert & orientedx3, cranial nerves grossly intact. moves all 4 extremities w/o difficulty. Affect pleasant   ECG:  AF 84 LVH with repol Personally reviewed  Recent Labs: 07/20/2020: B Natriuretic Peptide 552.9 03/07/2021: ALT 18; BUN 38; Creatinine 2.29; Hemoglobin 10.4; Platelets 201; Potassium 4.8; Sodium 141 04/20/2021: TSH 1.620  Personally reviewed   Wt Readings from Last 3 Encounters:  06/28/21 67 kg (147 lb 9.6 oz)  04/20/21 67.4 kg (148 lb 9.6 oz)  03/07/21 66.9 kg (147 lb 8 oz)      ASSESSMENT AND PLAN:  1. CAD - 3V CAD with STEMI 07/2017 S/P DES x2 to Ramus - No s/s ischemia  - Continue eliquis, and high dose statin. Off ASA. - Intolerant of carvedilol 3.125 bid    2. Chronic Systolic Heart Failure, ICM- ECHO EF 07/2017 EF 40-45%   Echo 12/2017 EF 45-50%  - Echo 9/20 EF ~40-45% 3+ MR - Echo 03/18/20 EF 40-45% severe MR - Underwent MitraClip 05/12/20. Post op echo showed EF 45-50%, with moderate residual MR and mean gradient of 4.63mm Hg. - Echo 11/22: EF 40-45%, RV mildly reduced, RVSP 50 mmHg, moderate residual MR with mean gradient 5 mmHg. - Volume status stable. Takes lasix 20mg  ~ 2-3X per week.  - Continues to do quite well. Stable NYHA II - Continue losartan 25 mg twice a day. - Continue 25 mg spironolactone daily  - Failed carvedilol due to fatigue - Check labs - Will defer SGLT2i with GFR ~20 - Reinforced fluid restriction to < 2 L daily, sodium restriction to less than 2000 mg daily, and the importance of daily weights.  3. Permanent  AF -S/P DC-CV 06/13/2018 to NSR but back in A fib on 1/16  - Remains in A fib. She was intolerant amio due to nausea and tremors.  - Dr  Allred was planning PPM insertion, then uptitration of BB. She has deferred this procedure for now. No need for PPM currently.  - Zio 9/21 shows avg rate 84bpm. Intolerant of carvedilol 3.125 (failed 2x) - Previously followed in AF clinic.  - Continue eliquis 2.5 mg BID. No bleeding. Labs today  4. Mitral regurgitation - ischemic in nature. 3+ MR on echo - s/p MitraClip 12/21 - Much improved  5. HTN - Blood pressure well controlled. Continue current regimen.   6. H.O Left Breast Cancer - No change to current plan.     7. CKD IIIb - Creatinine 1.5-1.9 at baseline. Most recent Scr 2.29 (GFR 21) - recheck labs today  - Follows with nephrology - Dr. Kathrene Bongo - Will defer SGLT2i with GFR ~20  Arvilla Meres, MD  3:57 PM   [facility] [address] [facility] [place] [address] [phone] (office) [phone] (fax)

## 2021-07-10 ENCOUNTER — Other Ambulatory Visit (HOSPITAL_COMMUNITY): Payer: Self-pay | Admitting: Internal Medicine

## 2021-07-12 ENCOUNTER — Other Ambulatory Visit (HOSPITAL_COMMUNITY): Payer: Self-pay | Admitting: Internal Medicine

## 2021-08-31 NOTE — Progress Notes (Signed)
Prolia PA exp 08/23/21 ?

## 2021-09-06 ENCOUNTER — Other Ambulatory Visit: Payer: Self-pay

## 2021-09-06 DIAGNOSIS — C50512 Malignant neoplasm of lower-outer quadrant of left female breast: Secondary | ICD-10-CM

## 2021-09-07 ENCOUNTER — Inpatient Hospital Stay (HOSPITAL_BASED_OUTPATIENT_CLINIC_OR_DEPARTMENT_OTHER): Payer: Medicare Other | Admitting: Hematology and Oncology

## 2021-09-07 ENCOUNTER — Ambulatory Visit: Payer: Medicare Other

## 2021-09-07 ENCOUNTER — Other Ambulatory Visit: Payer: Medicare Other

## 2021-09-07 ENCOUNTER — Ambulatory Visit: Payer: Medicare Other | Admitting: Hematology and Oncology

## 2021-09-07 ENCOUNTER — Other Ambulatory Visit: Payer: Self-pay

## 2021-09-07 ENCOUNTER — Inpatient Hospital Stay: Payer: Medicare Other | Attending: Hematology and Oncology

## 2021-09-07 ENCOUNTER — Encounter: Payer: Self-pay | Admitting: Hematology and Oncology

## 2021-09-07 ENCOUNTER — Inpatient Hospital Stay: Payer: Medicare Other

## 2021-09-07 VITALS — BP 160/70 | HR 78 | Temp 97.3°F | Resp 17 | Wt 149.9 lb

## 2021-09-07 DIAGNOSIS — C50512 Malignant neoplasm of lower-outer quadrant of left female breast: Secondary | ICD-10-CM | POA: Diagnosis not present

## 2021-09-07 DIAGNOSIS — Z853 Personal history of malignant neoplasm of breast: Secondary | ICD-10-CM | POA: Insufficient documentation

## 2021-09-07 DIAGNOSIS — Z170 Estrogen receptor positive status [ER+]: Secondary | ICD-10-CM | POA: Diagnosis not present

## 2021-09-07 DIAGNOSIS — M810 Age-related osteoporosis without current pathological fracture: Secondary | ICD-10-CM | POA: Insufficient documentation

## 2021-09-07 DIAGNOSIS — M818 Other osteoporosis without current pathological fracture: Secondary | ICD-10-CM | POA: Diagnosis not present

## 2021-09-07 LAB — CBC WITH DIFFERENTIAL (CANCER CENTER ONLY)
Abs Immature Granulocytes: 0.03 10*3/uL (ref 0.00–0.07)
Basophils Absolute: 0.1 10*3/uL (ref 0.0–0.1)
Basophils Relative: 1 %
Eosinophils Absolute: 0.1 10*3/uL (ref 0.0–0.5)
Eosinophils Relative: 2 %
HCT: 31 % — ABNORMAL LOW (ref 36.0–46.0)
Hemoglobin: 10 g/dL — ABNORMAL LOW (ref 12.0–15.0)
Immature Granulocytes: 1 %
Lymphocytes Relative: 14 %
Lymphs Abs: 0.6 10*3/uL — ABNORMAL LOW (ref 0.7–4.0)
MCH: 31.5 pg (ref 26.0–34.0)
MCHC: 32.3 g/dL (ref 30.0–36.0)
MCV: 97.8 fL (ref 80.0–100.0)
Monocytes Absolute: 0.3 10*3/uL (ref 0.1–1.0)
Monocytes Relative: 8 %
Neutro Abs: 3 10*3/uL (ref 1.7–7.7)
Neutrophils Relative %: 74 %
Platelet Count: 167 10*3/uL (ref 150–400)
RBC: 3.17 MIL/uL — ABNORMAL LOW (ref 3.87–5.11)
RDW: 12.8 % (ref 11.5–15.5)
WBC Count: 4.1 10*3/uL (ref 4.0–10.5)
nRBC: 0 % (ref 0.0–0.2)

## 2021-09-07 LAB — CMP (CANCER CENTER ONLY)
ALT: 17 U/L (ref 0–44)
AST: 22 U/L (ref 15–41)
Albumin: 4.3 g/dL (ref 3.5–5.0)
Alkaline Phosphatase: 35 U/L — ABNORMAL LOW (ref 38–126)
Anion gap: 5 (ref 5–15)
BUN: 50 mg/dL — ABNORMAL HIGH (ref 8–23)
CO2: 26 mmol/L (ref 22–32)
Calcium: 11.1 mg/dL — ABNORMAL HIGH (ref 8.9–10.3)
Chloride: 109 mmol/L (ref 98–111)
Creatinine: 2.15 mg/dL — ABNORMAL HIGH (ref 0.44–1.00)
GFR, Estimated: 22 mL/min — ABNORMAL LOW (ref >60)
Glucose, Bld: 130 mg/dL — ABNORMAL HIGH (ref 70–99)
Potassium: 4.5 mmol/L (ref 3.5–5.1)
Sodium: 140 mmol/L (ref 135–145)
Total Bilirubin: 0.8 mg/dL (ref 0.3–1.2)
Total Protein: 7.3 g/dL (ref 6.5–8.1)

## 2021-09-07 MED ORDER — DENOSUMAB 60 MG/ML ~~LOC~~ SOSY
60.0000 mg | PREFILLED_SYRINGE | Freq: Once | SUBCUTANEOUS | Status: AC
  Administered 2021-09-07: 60 mg via SUBCUTANEOUS
  Filled 2021-09-07: qty 1

## 2021-09-07 NOTE — Progress Notes (Signed)
PA Valid 09/04/21 - 09/05/22. G4010 - Prolia ?

## 2021-09-07 NOTE — Patient Instructions (Signed)
Denosumab injection ?What is this medication? ?DENOSUMAB (den oh sue mab) slows bone breakdown. Prolia is used to treat osteoporosis in women after menopause and in men, and in people who are taking corticosteroids for 6 months or more. Xgeva is used to treat a high calcium level due to cancer and to prevent bone fractures and other bone problems caused by multiple myeloma or cancer bone metastases. Xgeva is also used to treat giant cell tumor of the bone. ?This medicine may be used for other purposes; ask your health care provider or pharmacist if you have questions. ?COMMON BRAND NAME(S): Prolia, XGEVA ?What should I tell my care team before I take this medication? ?They need to know if you have any of these conditions: ?dental disease ?having surgery or tooth extraction ?infection ?kidney disease ?low levels of calcium or Vitamin D in the blood ?malnutrition ?on hemodialysis ?skin conditions or sensitivity ?thyroid or parathyroid disease ?an unusual reaction to denosumab, other medicines, foods, dyes, or preservatives ?pregnant or trying to get pregnant ?breast-feeding ?How should I use this medication? ?This medicine is for injection under the skin. It is given by a health care professional in a hospital or clinic setting. ?A special MedGuide will be given to you before each treatment. Be sure to read this information carefully each time. ?For Prolia, talk to your pediatrician regarding the use of this medicine in children. Special care may be needed. For Xgeva, talk to your pediatrician regarding the use of this medicine in children. While this drug may be prescribed for children as young as 13 years for selected conditions, precautions do apply. ?Overdosage: If you think you have taken too much of this medicine contact a poison control center or emergency room at once. ?NOTE: This medicine is only for you. Do not share this medicine with others. ?What if I miss a dose? ?It is important not to miss your dose.  Call your doctor or health care professional if you are unable to keep an appointment. ?What may interact with this medication? ?Do not take this medicine with any of the following medications: ?other medicines containing denosumab ?This medicine may also interact with the following medications: ?medicines that lower your chance of fighting infection ?steroid medicines like prednisone or cortisone ?This list may not describe all possible interactions. Give your health care provider a list of all the medicines, herbs, non-prescription drugs, or dietary supplements you use. Also tell them if you smoke, drink alcohol, or use illegal drugs. Some items may interact with your medicine. ?What should I watch for while using this medication? ?Visit your doctor or health care professional for regular checks on your progress. Your doctor or health care professional may order blood tests and other tests to see how you are doing. ?Call your doctor or health care professional for advice if you get a fever, chills or sore throat, or other symptoms of a cold or flu. Do not treat yourself. This drug may decrease your body's ability to fight infection. Try to avoid being around people who are sick. ?You should make sure you get enough calcium and vitamin D while you are taking this medicine, unless your doctor tells you not to. Discuss the foods you eat and the vitamins you take with your health care professional. ?See your dentist regularly. Brush and floss your teeth as directed. Before you have any dental work done, tell your dentist you are receiving this medicine. ?Do not become pregnant while taking this medicine or for 5 months after   stopping it. Talk with your doctor or health care professional about your birth control options while taking this medicine. Women should inform their doctor if they wish to become pregnant or think they might be pregnant. There is a potential for serious side effects to an unborn child. Talk to  your health care professional or pharmacist for more information. ?What side effects may I notice from receiving this medication? ?Side effects that you should report to your doctor or health care professional as soon as possible: ?allergic reactions like skin rash, itching or hives, swelling of the face, lips, or tongue ?bone pain ?breathing problems ?dizziness ?jaw pain, especially after dental work ?redness, blistering, peeling of the skin ?signs and symptoms of infection like fever or chills; cough; sore throat; pain or trouble passing urine ?signs of low calcium like fast heartbeat, muscle cramps or muscle pain; pain, tingling, numbness in the hands or feet; seizures ?unusual bleeding or bruising ?unusually weak or tired ?Side effects that usually do not require medical attention (report to your doctor or health care professional if they continue or are bothersome): ?constipation ?diarrhea ?headache ?joint pain ?loss of appetite ?muscle pain ?runny nose ?tiredness ?upset stomach ?This list may not describe all possible side effects. Call your doctor for medical advice about side effects. You may report side effects to FDA at 1-800-FDA-1088. ?Where should I keep my medication? ?This medicine is only given in a clinic, doctor's office, or other health care setting and will not be stored at home. ?NOTE: This sheet is a summary. It may not cover all possible information. If you have questions about this medicine, talk to your doctor, pharmacist, or health care provider. ?? 2022 Elsevier/Gold Standard (2017-09-27 00:00:00) ? ?

## 2021-09-07 NOTE — Progress Notes (Signed)
?[place]  ?[phone] [phone]  ? ? ? ?ID: [name] DOB: 1935-01-08  MR#: [id number]  LKT#:[id number] ? ?Patient Care Team: ?Forrest Moron, MD as PCP - General (Internal Medicine) ?Bensimhon, Shaune Pascal, MD as PCP - Advanced Heart Failure (Cardiology) ?Clarene Essex, MD as Consulting Physician (Gastroenterology) ?Magrinat, Virgie Dad, MD (Inactive) as Consulting Physician (Oncology) ?Larey Dresser, MD as Consulting Physician (Cardiology) ?OTHER MD: ? ?CHIEF COMPLAINT: Estrogen receptor positive breast cancer ? ?CURRENT TREATMENT: Completed 5 years of anastrozole ? ? ?INTERVAL HISTORY: ? ?Patient is here for follow-up.  She completed 5 years of antiestrogen therapy but elected to proceed with annual appointments in oncology.  Since last visit, she denies any new health complaints. ?She has not noticed any changes in her breast. ?Mammogram December 2022, no evidence of malignancy. ?She continues to get Prolia for management of osteoporosis.  Last bone density about 5 years ago.  She cannot remember if she had another bone density scan recently.  She takes calcium and vitamin D supplementation. ?Rest of the pertinent 10 point ROS reviewed and negative. ? ? ? COVID 19 VACCINATION STATUS: Pfizer x4 ? ? ?BREAST CANCER HISTORY: ?From the original intake note: ? ?[name] noted a palpable mass in the left breast and brought it to her primary care physician's attention. On 09/27/2014 the patient underwent bilateral diagnostic mammography with tomosynthesis and left breast ultrasonography. The breast density was category B. In the outer left breast area there was an irregularly shaped mass not associated with suspicious microcalcifications or distortion. The previously described large of fibroadenolipoma in the right breast was unchanged. On physical exam there was a firm palpable mass at the 3:30 position in the left breast 2 cm prone the nipple which, on targeted ultrasound, was  irregular, hypoechoic, and measured 2.0 cm. Ultrasound of the left axilla was negative. ? ?Biopsy of this mass 10/05/2014 showed ([id number]) an invasive ductal carcinoma him a grade 1, estrogen receptor 90% positive, progesterone receptor 90% positive, both with strong staining intensity, with an MIB-1 of 5%, and no HER-2 amplification, the signals ratio being 1.06 and the number per cell 1.70. ? ?The patient's subsequent history is as detailed below ? ? ?PAST MEDICAL HISTORY: ?Past Medical History:  ?Diagnosis Date  ? Anemia   ? Anxiety   ? Atrial fibrillation ([place])   ? Breast cancer of lower-outer quadrant of left female breast ([place]) 10/11/2014  ? treated with lumpectomy and radiation  ? Chronic systolic CHF (congestive heart failure) ([place])   ? CKD (chronic kidney disease)   ? Coronary artery disease 07/27/2017  ? GERD (gastroesophageal reflux disease)   ? Hypertension   ? Mitral regurgitation   ? Myocardial infarction ([place]) 07/27/2017  ? STEMI of anterolateral wall. Cath w/ stent and cabg by Dr. Martinique  ? Osteoporosis 10/20/2014  ? Personal history of radiation therapy 2016  ? treatment of breast cancer  ? PONV (postoperative nausea and vomiting)   ? pt denies this.  ? PUD (peptic ulcer disease)   ? Pure hypercholesterolemia 04/08/2014  ? S/P mitral valve clip implantation 05/12/2020  ? s/p TEER with two NTW Mitraclip devices by Dr. Burt Knack  ? Stone, kidney   ? ? ?PAST SURGICAL HISTORY: ?Past Surgical History:  ?Procedure Laterality Date  ? BREAST LUMPECTOMY Left 2016  ? BREAST LUMPECTOMY WITH SENTINEL LYMPH NODE BIOPSY Left 10/22/2014  ? Procedure: BREAST LUMPECTOMY WITH SENTINEL LYMPH NODE BIOPSY;  Surgeon: Autumn Messing III, MD;  Location: [place];  Service: General;  Laterality: Left;  ? CARDIAC CATHETERIZATION    ? CARDIOVERSION N/A 06/13/2018  ? Procedure: CARDIOVERSION;  Surgeon: Jolaine Artist, MD;  Location: [facility] ENDOSCOPY;  Service: Cardiovascular;  Laterality: N/A;  ? CORONARY STENT  INTERVENTION N/A 07/27/2017  ? Procedure: CORONARY STENT INTERVENTION;  Surgeon: Martinique, Peter M, MD;  Location: [place] CV LAB;  Service: Cardiovascular;  Laterality: N/A;  ? CORONARY/GRAFT ACUTE MI REVASCULARIZATION N/A 07/27/2017  ? Procedure: Coronary/Graft Acute MI Revascularization;  Surgeon: Martinique, Peter M, MD;  Location: [place] CV LAB;  Service: Cardiovascular;  Laterality: N/A;  ? LEFT HEART CATH AND CORONARY ANGIOGRAPHY N/A 07/27/2017  ? Procedure: LEFT HEART CATH AND CORONARY ANGIOGRAPHY;  Surgeon: Martinique, Peter M, MD;  Location: [place] CV LAB;  Service: Cardiovascular;  Laterality: N/A;  ? MITRAL VALVE REPAIR N/A 05/12/2020  ? Procedure: MITRAL VALVE REPAIR;  Surgeon: Sherren Mocha, MD;  Location: [place] CV LAB;  Service: Cardiovascular;  Laterality: N/A;  ? RE-EXCISION OF BREAST CANCER,SUPERIOR MARGINS Left 11/04/2014  ? Procedure: RE-EXCISION OF LEFT BREAST INFERIOR MARGINS;  Surgeon: Autumn Messing III, MD;  Location: [place];  Service: General;  Laterality: Left;  ? RIGHT HEART CATH N/A 04/05/2020  ? Procedure: RIGHT HEART CATH;  Surgeon: Jolaine Artist, MD;  Location: [place] CV LAB;  Service: Cardiovascular;  Laterality: N/A;  ? TEE WITHOUT CARDIOVERSION N/A 04/05/2020  ? Procedure: TRANSESOPHAGEAL ECHOCARDIOGRAM (TEE);  Surgeon: Jolaine Artist, MD;  Location: [facility] ENDOSCOPY;  Service: Cardiovascular;  Laterality: N/A;  ? TEE WITHOUT CARDIOVERSION N/A 05/12/2020  ? Procedure: TRANSESOPHAGEAL ECHOCARDIOGRAM (TEE);  Surgeon: Sherren Mocha, MD;  Location: [place] CV LAB;  Service: Cardiovascular;  Laterality: N/A;  ? ? ?FAMILY HISTORY ?Family History  ?Problem Relation Age of Onset  ? Pancreatic cancer Mother   ?     pancreastic  ? Stroke Father   ? Hypertension Brother   ? Hypertension Sister   ? Hypertension Brother   ? Heart disease Brother   ? Hypertension Sister   ? the patient's father died following a stroke at age 23. The patient's mother died from pancreatic cancer the  age of 68. The patient had 5 brothers, 2 sisters. There is no history of breast or ovarian cancer in the family to the patient's knowledge  ? ? ?GYNECOLOGIC HISTORY:  ?No LMP recorded (lmp unknown). Patient is postmenopausal. ? menarche age 39, first live birth age 87, the patient is [place] P4. She went through the change of life in her late 55s. She took hormone replacement for approximately 3 years. She also took oral contraceptives for a few years remotely, with no complications.  ? ? ?SOCIAL HISTORY: (Updated September 2021. 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 Her husband Jenny Reichmann was a Horticulturist, commercial.  He died at home unexpectedly 2020/03/27 from blood clots in the lung.  Daughter [name] lives in  [place], near [place], where she Biomedical engineer. Son [name] lives in [place] and works in Building control surveyor. Son [name] also lives in [place]. He is a Environmental education officer with [employer]. Daughter [name] lives in [place] and teaches in [place] The patient has 11 grandchildren. She attends a local [place]  ?  ? ADVANCED DIRECTIVES:  the patient has named her son [name] as healthcare power of attorney. He can be reached at [phone].  ? ? ?HEALTH MAINTENANCE: ?Social  History  ? ?Tobacco Use  ? Smoking status: Never  ? Smokeless tobacco: Never  ?Vaping Use  ? Vaping Use: Never used  ?Substance Use Topics  ? Alcohol use: No  ? Drug use: No  ? ? ? Colonoscopy: Due 2019? ? PAP: ? Bone density:April 2018 ? ? ?Allergies  ?Allergen Reactions  ? Lisinopril Cough  ? ? ?Current Outpatient Medications  ?Medication Sig Dispense Refill  ? acetaminophen (TYLENOL) 500 MG tablet Take 500-1,000 mg by mouth every 6 (six) hours as needed (for pain.).    ? amoxicillin (AMOXIL) 500 MG tablet Take 4 tablets (2,000 mg total) 1 hour prior to all dental visits. 8 tablet 11  ? apixaban (ELIQUIS) 2.5 MG TABS tablet Take  1 tablet (2.5 mg total) by mouth 2 (two) times daily. 60 tablet 11  ? atorvastatin (LIPITOR) 80 MG tablet Take 1 tablet (80 mg total) by mouth daily. Needs appt 90 tablet 0  ? B-12, Methylcobalamin, 1000 MC

## 2021-09-08 ENCOUNTER — Other Ambulatory Visit: Payer: Medicare Other

## 2021-09-08 ENCOUNTER — Ambulatory Visit: Payer: Medicare Other

## 2021-09-08 ENCOUNTER — Ambulatory Visit: Payer: Medicare Other | Admitting: Hematology and Oncology

## 2021-09-08 ENCOUNTER — Telehealth: Payer: Self-pay | Admitting: Hematology and Oncology

## 2021-09-08 NOTE — Telephone Encounter (Signed)
Scheduled appointment per 04/06 los. Patient aware.  ?

## 2021-10-09 ENCOUNTER — Other Ambulatory Visit (HOSPITAL_COMMUNITY): Payer: Self-pay | Admitting: Internal Medicine

## 2021-11-03 ENCOUNTER — Other Ambulatory Visit (HOSPITAL_COMMUNITY): Payer: Self-pay | Admitting: Internal Medicine

## 2021-11-13 ENCOUNTER — Encounter (HOSPITAL_COMMUNITY): Payer: Self-pay | Admitting: Internal Medicine

## 2021-11-13 ENCOUNTER — Ambulatory Visit (HOSPITAL_COMMUNITY)
Admission: RE | Admit: 2021-11-13 | Discharge: 2021-11-13 | Disposition: A | Discharge status: Home / Self Care | Payer: Medicare Other | Source: Ambulatory Visit | Attending: Internal Medicine | Admitting: Internal Medicine

## 2021-11-13 VITALS — BP 130/72 | HR 82 | Wt 152.4 lb

## 2021-11-13 DIAGNOSIS — Z79899 Other long term (current) drug therapy: Secondary | ICD-10-CM | POA: Diagnosis not present

## 2021-11-13 DIAGNOSIS — I4821 Permanent atrial fibrillation: Secondary | ICD-10-CM | POA: Insufficient documentation

## 2021-11-13 DIAGNOSIS — I340 Nonrheumatic mitral (valve) insufficiency: Secondary | ICD-10-CM | POA: Insufficient documentation

## 2021-11-13 DIAGNOSIS — I252 Old myocardial infarction: Secondary | ICD-10-CM | POA: Insufficient documentation

## 2021-11-13 DIAGNOSIS — N184 Chronic kidney disease, stage 4 (severe): Secondary | ICD-10-CM | POA: Diagnosis not present

## 2021-11-13 DIAGNOSIS — Z853 Personal history of malignant neoplasm of breast: Secondary | ICD-10-CM | POA: Diagnosis not present

## 2021-11-13 DIAGNOSIS — K219 Gastro-esophageal reflux disease without esophagitis: Secondary | ICD-10-CM | POA: Insufficient documentation

## 2021-11-13 DIAGNOSIS — Z8674 Personal history of sudden cardiac arrest: Secondary | ICD-10-CM | POA: Insufficient documentation

## 2021-11-13 DIAGNOSIS — I4819 Other persistent atrial fibrillation: Secondary | ICD-10-CM

## 2021-11-13 DIAGNOSIS — I130 Hypertensive heart and chronic kidney disease with heart failure and stage 1 through stage 4 chronic kidney disease, or unspecified chronic kidney disease: Secondary | ICD-10-CM | POA: Insufficient documentation

## 2021-11-13 DIAGNOSIS — E7800 Pure hypercholesterolemia, unspecified: Secondary | ICD-10-CM

## 2021-11-13 DIAGNOSIS — I5022 Chronic systolic (congestive) heart failure: Secondary | ICD-10-CM | POA: Diagnosis not present

## 2021-11-13 DIAGNOSIS — Z9889 Other specified postprocedural states: Secondary | ICD-10-CM | POA: Diagnosis not present

## 2021-11-13 DIAGNOSIS — Z95818 Presence of other cardiac implants and grafts: Secondary | ICD-10-CM

## 2021-11-13 DIAGNOSIS — I2510 Atherosclerotic heart disease of native coronary artery without angina pectoris: Secondary | ICD-10-CM | POA: Insufficient documentation

## 2021-11-13 DIAGNOSIS — Z7901 Long term (current) use of anticoagulants: Secondary | ICD-10-CM | POA: Diagnosis not present

## 2021-11-13 DIAGNOSIS — Z923 Personal history of irradiation: Secondary | ICD-10-CM | POA: Diagnosis not present

## 2021-11-13 DIAGNOSIS — Z955 Presence of coronary angioplasty implant and graft: Secondary | ICD-10-CM | POA: Insufficient documentation

## 2021-11-13 LAB — COMPREHENSIVE METABOLIC PANEL
ALT: 23 U/L (ref 0–44)
AST: 25 U/L (ref 15–41)
Albumin: 3.9 g/dL (ref 3.5–5.0)
Alkaline Phosphatase: 33 U/L — ABNORMAL LOW (ref 38–126)
Anion gap: 8 (ref 5–15)
BUN: 33 mg/dL — ABNORMAL HIGH (ref 8–23)
CO2: 22 mmol/L (ref 22–32)
Calcium: 10.8 mg/dL — ABNORMAL HIGH (ref 8.9–10.3)
Chloride: 108 mmol/L (ref 98–111)
Creatinine, Ser: 1.9 mg/dL — ABNORMAL HIGH (ref 0.44–1.00)
GFR, Estimated: 26 mL/min — ABNORMAL LOW (ref >60)
Glucose, Bld: 96 mg/dL (ref 70–99)
Potassium: 5.1 mmol/L (ref 3.5–5.1)
Sodium: 138 mmol/L (ref 135–145)
Total Bilirubin: 1.2 mg/dL (ref 0.3–1.2)
Total Protein: 6.8 g/dL (ref 6.5–8.1)

## 2021-11-13 LAB — CBC
HCT: 31.9 % — ABNORMAL LOW (ref 36.0–46.0)
Hemoglobin: 9.9 g/dL — ABNORMAL LOW (ref 12.0–15.0)
MCH: 31 pg (ref 26.0–34.0)
MCHC: 31 g/dL (ref 30.0–36.0)
MCV: 100 fL (ref 80.0–100.0)
Platelets: 185 10*3/uL (ref 150–400)
RBC: 3.19 MIL/uL — ABNORMAL LOW (ref 3.87–5.11)
RDW: 12.7 % (ref 11.5–15.5)
WBC: 3.5 10*3/uL — ABNORMAL LOW (ref 4.0–10.5)
nRBC: 0 % (ref 0.0–0.2)

## 2021-11-13 LAB — LIPID PANEL
Cholesterol: 130 mg/dL (ref 0–200)
HDL: 69 mg/dL (ref >40)
LDL Cholesterol: 56 mg/dL (ref 0–99)
Total CHOL/HDL Ratio: 1.9 RATIO
Triglycerides: 25 mg/dL (ref <150)
VLDL: 5 mg/dL (ref 0–40)

## 2021-11-13 LAB — BRAIN NATRIURETIC PEPTIDE: B Natriuretic Peptide: 474.6 pg/mL — ABNORMAL HIGH (ref 0.0–100.0)

## 2021-11-13 NOTE — Patient Instructions (Signed)
There has been no changes to your medications.  Labs done today, your results will be available in MyChart, we will contact you for abnormal readings.  Your physician has requested that you have an echocardiogram. Echocardiography is a painless test that uses sound waves to create images of your heart. It provides your doctor with information about the size and shape of your heart and how well your heart's chambers and valves are working. This procedure takes approximately one hour. There are no restrictions for this procedure.  Your physician recommends that you schedule a follow-up appointment in: 6 months ( December 2023) ** please call the office in October to arrange your follow up appointment.  If you have any questions or concerns before your next appointment please send [place] a message through [place] or call our office at [phone].    TO LEAVE A MESSAGE FOR THE NURSE SELECT OPTION 2, PLEASE LEAVE A MESSAGE INCLUDING: YOUR NAME DATE OF BIRTH CALL BACK NUMBER REASON FOR CALL**this is important as we prioritize the call backs  YOU WILL RECEIVE A CALL BACK THE SAME DAY AS LONG AS YOU CALL BEFORE 4:00 PM  At the [facility], you and your health needs are our priority. As part of our continuing mission to provide you with exceptional heart care, we have created designated Provider Care Teams. These Care Teams include your primary Cardiologist (physician) and Advanced Practice Providers (APPs- Physician Assistants and Nurse Practitioners) who all work together to provide you with the care you need, when you need it.   You may see any of the following providers on your designated Care Team at your next follow up: Dr Glori Bickers Dr Haynes Kerns, NP Lyda Jester, [place] [facility] [place], [place] Audry Riles, PharmD   Please be sure to bring in all your medications bottles to every appointment.

## 2021-11-13 NOTE — Progress Notes (Signed)
Advanced Heart Failure Clinic Note   Date:  11/13/2021   ID:  [name], DOB 24-Jul-1934, MRN [id number]  Location: Home  Provider location: [facility] Type of Visit: Established patient  PCP:  [name], MD  Cardiologist:  None Primary HF: [name]  Chief Complaint: Heart Failure follow-up   History of Present Illness:  [name] is a 87 y.o. female  with h/o HTN, breast cancer, CAD s/p anterior STEMI 2/19, chronic AF and systolic HF.    Admitted to [facility]  ED 07/27/17 with CP. Hospital course was complicated by acute respiratory failure, bradycardic arrest, Afib RVR, and cardiogenic shock in the setting of anterior MI.  Found to have acute anterior MI and taken urgently to the cath lab. LHC 07/27/17 as below with 3v CAD, LVEF 35-40%, elevated LVEDP. Underwent PCI with successful stenting of the ramus intermediate artery with DES x 2.. As she improved she was extubated on 2/24. An ECHO was completed an showed LVEF 40-45%.      On 06/13/18 she underwent cardioversion for recurrent A fib. She returned to the A fib clinic on 06/19/18 and was back in A Fib. Zio patch to assess AF rate = continuous AF with average rate 84. (was not on carvedilol at that time)   Echo 10/21 EF 40-45% severe MR. Mod TR. Mild AI.   Underwent MitraClip 05/12/20. Post op echo showed EF 45-50%, with moderate residual MR and mean gradient of 4.55mm Hg.   Echo 11/22: EF 40-45%, RV mildly reduced, RVSP 50 mmHg, moderate residual MR with mean gradient 5 mmHg.  Here for follow-up with her son. Working in the garden and going shopping without problem. No edema, orthopnea or PND. Aware of SBE prophylaxis. No CP. Taking lasix as needed.   Cath results:   [facility] 07/27/2017  Post Atrio lesion is 90% stenosed. Prox LAD to Dist LAD lesion is 50% stenosed. Ost 1st Diag lesion is 95% stenosed. Ost 2nd Diag to 2nd Diag lesion is 90% stenosed. Mid Cx lesion is 30% stenosed. Ost  Ramus to Ramus lesion is 100% stenosed. A drug-eluting stent was successfully placed using a STENT SYNERGY DES 2.75X28. A drug-eluting stent was successfully placed using a STENT SYNERGY DES 2.5X12. Post intervention, there is a 0% residual stenosis. There is moderate to severe left ventricular systolic dysfunction. LV end diastolic pressure is moderately elevated. The left ventricular ejection fraction is 35-45% by visual estimate. 1. 3 vessel obstructive CAD    - 100% ramus intermediate artery. This is a large vessel and is the culprit lesion.    - 95% small first diagonal    - 90% small second diagonal     - diffusely diseased LAD    - 90% PLOM    Past Medical History:  Diagnosis Date   Anemia    Anxiety    Atrial fibrillation (HCC)    Breast cancer of lower-outer quadrant of left female breast (H[facility]) 10/11/2014   treated with lumpectomy and radiation   Chronic systolic CHF (congestive heart failure) (H[facility])    CKD (chronic kidney disease)    Coronary artery disease 07/27/2017   GERD (gastroesophageal reflux disease)    Hypertension    Mitral regurgitation    Myocardial infarction ([facility]) 07/27/2017   STEMI of anterolateral wall. Cath w/ stent and cabg by Dr. Swaziland   Osteoporosis 10/20/2014   Personal history of radiation therapy 2016   treatment of breast cancer  PONV (postoperative nausea and vomiting)    pt denies this.   PUD (peptic ulcer disease)    Pure hypercholesterolemia 04/08/2014   S/P mitral valve clip implantation 05/12/2020   s/p TEER with two NTW Mitraclip devices by Dr. Torrie Mayers, kidney    Past Surgical History:  Procedure Laterality Date   BREAST LUMPECTOMY Left 2016   BREAST LUMPECTOMY WITH SENTINEL LYMPH NODE BIOPSY Left 10/22/2014   Procedure: BREAST LUMPECTOMY WITH SENTINEL LYMPH NODE BIOPSY;  Surgeon: Chevis Pretty III, MD;  Location: [facility];  Service: General;  Laterality: Left;   CARDIAC CATHETERIZATION     CARDIOVERSION N/A  06/13/2018   Procedure: CARDIOVERSION;  Surgeon: Dolores Patty, MD;  Location: [facility] ENDOSCOPY;  Service: Cardiovascular;  Laterality: N/A;   CORONARY STENT INTERVENTION N/A 07/27/2017   Procedure: CORONARY STENT INTERVENTION;  Surgeon: Swaziland, Peter M, MD;  Location: [facility] INVASIVE CV LAB;  Service: Cardiovascular;  Laterality: N/A;   CORONARY/GRAFT ACUTE MI REVASCULARIZATION N/A 07/27/2017   Procedure: Coronary/Graft Acute MI Revascularization;  Surgeon: Swaziland, Peter M, MD;  Location: [facility] INVASIVE CV LAB;  Service: Cardiovascular;  Laterality: N/A;   LEFT HEART CATH AND CORONARY ANGIOGRAPHY N/A 07/27/2017   Procedure: LEFT HEART CATH AND CORONARY ANGIOGRAPHY;  Surgeon: Swaziland, Peter M, MD;  Location: [facility] INVASIVE CV LAB;  Service: Cardiovascular;  Laterality: N/A;   MITRAL VALVE REPAIR N/A 05/12/2020   Procedure: MITRAL VALVE REPAIR;  Surgeon: Tonny Bollman, MD;  Location: [facility] INVASIVE CV LAB;  Service: Cardiovascular;  Laterality: N/A;   RE-EXCISION OF BREAST CANCER,SUPERIOR MARGINS Left 11/04/2014   Procedure: RE-EXCISION OF LEFT BREAST INFERIOR MARGINS;  Surgeon: Chevis Pretty III, MD;  Location: [facility] OR;  Service: General;  Laterality: Left;   RIGHT HEART CATH N/A 04/05/2020   Procedure: RIGHT HEART CATH;  Surgeon: Dolores Patty, MD;  Location: [facility] INVASIVE CV LAB;  Service: Cardiovascular;  Laterality: N/A;   TEE WITHOUT CARDIOVERSION N/A 04/05/2020   Procedure: TRANSESOPHAGEAL ECHOCARDIOGRAM (TEE);  Surgeon: Dolores Patty, MD;  Location: [facility] ENDOSCOPY;  Service: Cardiovascular;  Laterality: N/A;   TEE WITHOUT CARDIOVERSION N/A 05/12/2020   Procedure: TRANSESOPHAGEAL ECHOCARDIOGRAM (TEE);  Surgeon: Tonny Bollman, MD;  Location: [facility] INVASIVE CV LAB;  Service: Cardiovascular;  Laterality: N/A;     Current Outpatient Medications  Medication Sig Dispense Refill   acetaminophen (TYLENOL) 500 MG tablet Take 500-1,000 mg by mouth every 6 (six) hours as needed (for pain.).     amoxicillin (AMOXIL)  500 MG tablet Take 4 tablets (2,000 mg total) 1 hour prior to all dental visits. 8 tablet 11   apixaban (ELIQUIS) 2.5 MG TABS tablet Take 1 tablet (2.5 mg total) by mouth 2 (two) times daily. 60 tablet 11   atorvastatin (LIPITOR) 80 MG tablet Take 1 tablet (80 mg total) by mouth daily. Needs appt 90 tablet 0   B-12, Methylcobalamin, 1000 MCG SUBL Take 1,000 mcg by mouth daily.     Cholecalciferol (VITAMIN D) 125 MCG (5000 UT) CAPS Take by mouth in the morning and at bedtime.     Co-Enzyme Q10 100 MG CAPS Take by mouth 2 (two) times daily.     DHA-EPA-Vitamin E (OMEGA-3 COMPLEX PO) Take 1 tablet by mouth 2 (two) times daily.     ferrous sulfate 325 (65 FE) MG tablet Take 325 mg by mouth daily.     furosemide (LASIX) 40 MG tablet Take 40 mg by mouth as needed.     losartan (COZAAR) 25 MG  tablet TAKE 1 TABLET BY MOUTH IN THE MORNING AND AT BEDTIME 180 tablet 4   Magnesium 200 MG CHEW Chew 400 mg by mouth daily.     Multiple Vitamin (MULTIVITAMIN WITH MINERALS) TABS tablet Take 1 tablet by mouth daily.     nitroGLYCERIN (NITROSTAT) 0.3 MG SL tablet DISSOLVE ONE TABLET UNDER THE TONGUE EVERY 5 MINUTES AS NEEDED FOR CHEST PAIN.  DO NOT EXCEED A TOTAL OF 3 DOSES IN 15 MINUTES NOW 100 tablet 0   spironolactone (ALDACTONE) 25 MG tablet Take 1 tablet by mouth once daily 90 tablet 3   No current facility-administered medications for this encounter.    Allergies:   Lisinopril   Social History:  The patient  reports that she has never smoked. She has never used smokeless tobacco. She reports that she does not drink alcohol and does not use drugs.   Family History:  The patient's family history includes Heart disease in her brother; Hypertension in her brother, brother, sister, and sister; Pancreatic cancer in her mother; Stroke in her father.   ROS:  Please see the history of present illness.   All other systems are personally reviewed and negative.   Vitals:   11/13/21 1052  BP: 130/72  Pulse: 82   SpO2: 100%  Weight: 69.1 kg (152 lb 6.4 oz)     Exam:   General:  Well appearing. No resp difficulty HEENT: normal Neck: supple. no JVD. Carotids 2+ bilat; no bruits. No lymphadenopathy or thryomegaly appreciated. Cor: PMI nondisplaced. Mildly irregular rate & rhythm. 2/6 MR Lungs: clear Abdomen: soft, nontender, nondistended. No hepatosplenomegaly. No bruits or masses. Good bowel sounds. Extremities: no cyanosis, clubbing, rash, edema Neuro: alert & orientedx3, cranial nerves grossly intact. moves all 4 extremities w/o difficulty. Affect pleasant  Recent Labs: 04/20/2021: TSH 1.620 06/28/2021: B Natriuretic Peptide 375.4 09/07/2021: ALT 17; BUN 50; Creatinine 2.15; Hemoglobin 10.0; Platelet Count 167; Potassium 4.5; Sodium 140  Personally reviewed   Wt Readings from Last 3 Encounters:  11/13/21 69.1 kg (152 lb 6.4 oz)  09/07/21 68 kg (149 lb 14.4 oz)  06/28/21 67 kg (147 lb 9.6 oz)      ASSESSMENT AND PLAN:  1. CAD - 3V CAD with STEMI 07/2017 S/P DES x2 to Ramus - No s/s ischemia - Continue eliquis, and high dose statin. Off ASA. - Intolerant of carvedilol 3.125 bid  - Check lipids today. Goal LDL < 70   2. Chronic Systolic Heart Failure, ICM- ECHO EF 07/2017 EF 40-45%   Echo 12/2017 EF 45-50%  - Echo 9/20 EF ~40-45% 3+ MR - Echo 03/18/20 EF 40-45% severe MR - Underwent MitraClip 05/12/20. Post op echo showed EF 45-50%, with moderate residual MR and mean gradient of 4.81mm Hg. - Echo 11/22: EF 40-45%, RV mildly reduced, RVSP 50 mmHg, moderate residual MR with mean gradient 5 mmHg. - Volume status looks good. Takes lasix as needed - Continues to do quite well. Stable NYHA II - Continue losartan 25 mg twice a day. If SCr worsening may need to stop  - Continue 25 mg spironolactone daily  - Failed carvedilol due to fatigue - Check labs and repeat echo - Will defer SGLT2i with GFR ~20  3. Permanent AF -S/P DC-CV 06/13/2018 to NSR but back in A fib on 1/16  - Remains in A  fib. She was intolerant amio due to nausea and tremors.  - Dr Johney Frame had discussed PPM insertion, then uptitration of BB. She has deferred this procedure for  now. No need for PPM currently.  - Zio 9/21 shows avg rate 84bpm. Intolerant of carvedilol 3.125 (failed 2x) - Previously followed in AF clinic.  - Continue eliquis 2.5 mg BID. No bleeding  - Labs today  4. Mitral regurgitation - ischemic in nature. 3+ MR on echo - s/p MitraClip 12/21 - Much improved. - Repeat echo as above - Aware of SBE prophylaxis  5. HTN - Blood pressure well controlled. Continue current regimen.   6. H.O Left Breast Cancer - No change to current plan.     7. CKD IV - Creatinine 2.1-2.3 (GFR 21) - recheck labs today  - Follows with nephrology - Dr. Kathrene Bongo - Will defer SGLT2i with GFR ~20  Arvilla Meres, MD  11:06 AM   Advanced Heart Failure Clinic [facility] [address] [facility] [place] [address] [phone] (office) [phone] (fax)

## 2021-12-12 ENCOUNTER — Other Ambulatory Visit (HOSPITAL_COMMUNITY): Payer: Self-pay | Admitting: Internal Medicine

## 2022-01-08 ENCOUNTER — Ambulatory Visit (HOSPITAL_COMMUNITY)
Admission: RE | Admit: 2022-01-08 | Discharge: 2022-01-08 | Disposition: A | Discharge status: Home / Self Care | Payer: Medicare Other | Source: Ambulatory Visit | Attending: Internal Medicine | Admitting: Internal Medicine

## 2022-01-08 DIAGNOSIS — I083 Combined rheumatic disorders of mitral, aortic and tricuspid valves: Secondary | ICD-10-CM | POA: Insufficient documentation

## 2022-01-08 DIAGNOSIS — I4891 Unspecified atrial fibrillation: Secondary | ICD-10-CM | POA: Diagnosis not present

## 2022-01-08 DIAGNOSIS — I2510 Atherosclerotic heart disease of native coronary artery without angina pectoris: Secondary | ICD-10-CM | POA: Insufficient documentation

## 2022-01-08 DIAGNOSIS — I5022 Chronic systolic (congestive) heart failure: Secondary | ICD-10-CM | POA: Insufficient documentation

## 2022-01-08 LAB — ECHOCARDIOGRAM COMPLETE
AR max vel: 2.72 cm2
AV Area VTI: 2.26 cm2
AV Area mean vel: 2.65 cm2
AV Mean grad: 2 mmHg
AV Peak grad: 3.8 mmHg
Ao pk vel: 0.98 m/s
MV VTI: 0.98 cm2
P 1/2 time: 685 msec
S' Lateral: 4.1 cm

## 2022-01-08 NOTE — Progress Notes (Signed)
  Echocardiogram 2D Echocardiogram has been performed.  [name] F 01/08/2022, 12:38 PM

## 2022-03-08 ENCOUNTER — Other Ambulatory Visit: Payer: Self-pay | Admitting: *Deleted

## 2022-03-08 DIAGNOSIS — Z170 Estrogen receptor positive status [ER+]: Secondary | ICD-10-CM

## 2022-03-09 ENCOUNTER — Inpatient Hospital Stay: Payer: Medicare Other

## 2022-03-09 ENCOUNTER — Other Ambulatory Visit (HOSPITAL_COMMUNITY): Payer: Self-pay | Admitting: Internal Medicine

## 2022-03-09 ENCOUNTER — Inpatient Hospital Stay: Payer: Medicare Other | Attending: Hematology and Oncology

## 2022-03-09 VITALS — BP 158/67 | HR 76 | Temp 98.1°F | Resp 16

## 2022-03-09 DIAGNOSIS — M818 Other osteoporosis without current pathological fracture: Secondary | ICD-10-CM

## 2022-03-09 DIAGNOSIS — Z853 Personal history of malignant neoplasm of breast: Secondary | ICD-10-CM | POA: Diagnosis not present

## 2022-03-09 DIAGNOSIS — C50512 Malignant neoplasm of lower-outer quadrant of left female breast: Secondary | ICD-10-CM

## 2022-03-09 DIAGNOSIS — M810 Age-related osteoporosis without current pathological fracture: Secondary | ICD-10-CM | POA: Diagnosis present

## 2022-03-09 LAB — CBC WITH DIFFERENTIAL (CANCER CENTER ONLY)
Abs Immature Granulocytes: 0.01 10*3/uL (ref 0.00–0.07)
Basophils Absolute: 0 10*3/uL (ref 0.0–0.1)
Basophils Relative: 1 %
Eosinophils Absolute: 0.1 10*3/uL (ref 0.0–0.5)
Eosinophils Relative: 2 %
HCT: 30.6 % — ABNORMAL LOW (ref 36.0–46.0)
Hemoglobin: 9.9 g/dL — ABNORMAL LOW (ref 12.0–15.0)
Immature Granulocytes: 0 %
Lymphocytes Relative: 15 %
Lymphs Abs: 0.7 10*3/uL (ref 0.7–4.0)
MCH: 31.8 pg (ref 26.0–34.0)
MCHC: 32.4 g/dL (ref 30.0–36.0)
MCV: 98.4 fL (ref 80.0–100.0)
Monocytes Absolute: 0.5 10*3/uL (ref 0.1–1.0)
Monocytes Relative: 11 %
Neutro Abs: 3.3 10*3/uL (ref 1.7–7.7)
Neutrophils Relative %: 71 %
Platelet Count: 180 10*3/uL (ref 150–400)
RBC: 3.11 MIL/uL — ABNORMAL LOW (ref 3.87–5.11)
RDW: 12.7 % (ref 11.5–15.5)
WBC Count: 4.7 10*3/uL (ref 4.0–10.5)
nRBC: 0 % (ref 0.0–0.2)

## 2022-03-09 LAB — CMP (CANCER CENTER ONLY)
ALT: 17 U/L (ref 0–44)
AST: 21 U/L (ref 15–41)
Albumin: 4.3 g/dL (ref 3.5–5.0)
Alkaline Phosphatase: 43 U/L (ref 38–126)
Anion gap: 4 — ABNORMAL LOW (ref 5–15)
BUN: 34 mg/dL — ABNORMAL HIGH (ref 8–23)
CO2: 28 mmol/L (ref 22–32)
Calcium: 10.9 mg/dL — ABNORMAL HIGH (ref 8.9–10.3)
Chloride: 107 mmol/L (ref 98–111)
Creatinine: 1.93 mg/dL — ABNORMAL HIGH (ref 0.44–1.00)
GFR, Estimated: 25 mL/min — ABNORMAL LOW (ref >60)
Glucose, Bld: 125 mg/dL — ABNORMAL HIGH (ref 70–99)
Potassium: 4.8 mmol/L (ref 3.5–5.1)
Sodium: 139 mmol/L (ref 135–145)
Total Bilirubin: 0.9 mg/dL (ref 0.3–1.2)
Total Protein: 7.5 g/dL (ref 6.5–8.1)

## 2022-03-09 MED ORDER — DENOSUMAB 60 MG/ML ~~LOC~~ SOSY
60.0000 mg | PREFILLED_SYRINGE | Freq: Once | SUBCUTANEOUS | Status: AC
  Administered 2022-03-09: 60 mg via SUBCUTANEOUS
  Filled 2022-03-09: qty 1

## 2022-04-05 ENCOUNTER — Telehealth: Payer: Self-pay | Admitting: Adult Health

## 2022-04-05 ENCOUNTER — Inpatient Hospital Stay: Payer: Medicare Other | Admitting: Adult Health

## 2022-04-05 NOTE — Telephone Encounter (Signed)
Contacted patient to scheduled appointments. Patient is aware of appointments that are scheduled.   

## 2022-04-23 ENCOUNTER — Telehealth: Payer: Self-pay

## 2022-04-23 ENCOUNTER — Inpatient Hospital Stay: Payer: Medicare Other | Attending: Hematology and Oncology | Admitting: Adult Health

## 2022-04-23 VITALS — BP 136/83 | HR 93 | Temp 97.9°F | Resp 18 | Ht 68.0 in | Wt 151.1 lb

## 2022-04-23 DIAGNOSIS — Z170 Estrogen receptor positive status [ER+]: Secondary | ICD-10-CM | POA: Diagnosis not present

## 2022-04-23 DIAGNOSIS — C50512 Malignant neoplasm of lower-outer quadrant of left female breast: Secondary | ICD-10-CM | POA: Diagnosis not present

## 2022-04-23 DIAGNOSIS — Z853 Personal history of malignant neoplasm of breast: Secondary | ICD-10-CM | POA: Insufficient documentation

## 2022-04-23 DIAGNOSIS — Z923 Personal history of irradiation: Secondary | ICD-10-CM | POA: Diagnosis not present

## 2022-04-23 NOTE — Progress Notes (Signed)
Mary [name] Follow up:    Patient, No Pcp Per No address on file   DIAGNOSIS:  [name] Staging  Malignant neoplasm of lower-outer quadrant of left breast of female, estrogen receptor positive ([place]) Staging form: Breast, AJCC 7th Edition - Clinical stage from 10/13/2014: Stage IA (T1c, N0, M0) - Unsigned Staged by: Pathologist and managing physician Laterality: Left Estrogen receptor status: Positive Progesterone receptor status: Positive HER2 status: Negative Stage used in treatment planning: Yes National guidelines used in treatment planning: Yes Type of national guideline used in treatment planning: NCCN Staging comments: Staged in breast conference on 5.11.16 - Pathologic stage from 10/22/2014: Stage IA (T1c, N0, cM0) - Unsigned   SUMMARY OF ONCOLOGIC HISTORY: Oncology History  Malignant neoplasm of lower-outer quadrant of left breast of female, estrogen receptor positive ([place])  09/27/2014 Mammogram   Left breast: new irregularly-shaped mass outer quadrant corresponding to palpable area of concern. 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   09/27/2014 Breast US   Left breast: irregular hypoechoic mass with prominent internal vascular flow at 3:30 position 2 cm from the nipple that measures 2.0 x 1.9 x 1.2 cm.   10/05/2014 Initial Biopsy   Left breast core needle biopsy: Invasive mannary carcinoma with extravasated mucin, grade 1, ER+ (90%), PR+ (90%), HER2/neu negative (ratio 1.06), Ki67 5%.   10/05/2014 Clinical Stage   Stage IA: T1c N0   10/22/2014 Definitive Surgery   Left lumpectomy/SLNB Mary [name]): Invasive mammary carcinoma, mixed mucinous and ductal variant, HER2/neu repeated and remains negative (ratio 1.28), positive margins,  1 LN removed and negative for malignancy.   10/22/2014 Pathologic Stage   Stage IA: pT1c pN0   11/04/2014 Surgery   Re-excision of left breast margins: clear; no evidence of residual tumor.    12/21/2014 - 01/11/2015 Radiation Therapy   Adjuvant RT Mary [name]): Left Breast 42.56 Gy over 16 fractions   02/10/2015 -  Anti-estrogen oral therapy   Anastrozole 1 mg daily.  Planned duration of therapy 5 years    Survivorship   Survivorship care plan completed and mailed to patient in lieu of in person visit.     CURRENT THERAPY: Observation  INTERVAL HISTORY: Mary [name] 87 y.o. female returns for f/u of her h/o breast [name].  Her most recent mammogram occurred on 05/15/2021 and demonstrated no mammographic evidence of malignancy and breast density category B. She is overdue for bone density testing and this has not yet been scheduled.     Patient Active Problem List   Diagnosis Date Noted   Nonrheumatic mitral (valve) insufficiency 05/12/2020   S/P mitral valve clip implantation 05/12/2020   Non-rheumatic mitral regurgitation 04/05/2020   Chronic idiopathic constipation 12/05/2017   Asymptomatic microscopic hematuria 12/05/2017   Unintentional weight loss 12/05/2017   Chronic kidney disease, stage IV (severe) ([place]) 12/05/2017   Anemia associated with chronic renal failure 12/05/2017   Hypercalcemia 12/05/2017   A-fib ([place])    Pressure injury of skin 07/28/2017   Osteoporosis 10/20/2014   Malignant neoplasm of lower-outer quadrant of left breast of female, estrogen receptor positive ([place]) 10/11/2014   Pure hypercholesterolemia 04/08/2014   Anxiety    PUD (peptic ulcer disease)    HTN (hypertension), benign 06/29/2011   GERD (gastroesophageal reflux disease) 06/29/2011    is allergic to lisinopril.  MEDICAL HISTORY: Past Medical History:  Diagnosis Date   Anemia    Anxiety    Atrial fibrillation (HCC)    Breast [name] of lower-outer quadrant of  left female breast ([place]) 10/11/2014   treated with lumpectomy and radiation   Chronic systolic CHF (congestive heart failure) (HCC)    CKD (chronic kidney disease)    Coronary artery disease 07/27/2017   GERD  (gastroesophageal reflux disease)    Hypertension    Mitral regurgitation    Myocardial infarction ([place]) 07/27/2017   STEMI of anterolateral wall. Cath w/ stent and cabg by Dr. Martinique   Osteoporosis 10/20/2014   Personal history of radiation therapy 2016   treatment of breast [name]   PONV (postoperative nausea and vomiting)    pt denies this.   PUD (peptic ulcer disease)    Pure hypercholesterolemia 04/08/2014   S/P mitral valve clip implantation 05/12/2020   s/p TEER with two NTW Mitraclip devices by Dr. Clare Charon, kidney     SURGICAL HISTORY: Past Surgical History:  Procedure Laterality Date   BREAST LUMPECTOMY Left 2016   BREAST LUMPECTOMY WITH SENTINEL LYMPH NODE BIOPSY Left 10/22/2014   Procedure: BREAST LUMPECTOMY WITH SENTINEL LYMPH NODE BIOPSY;  Surgeon: Autumn Messing III, MD;  Location: [facility];  Service: General;  Laterality: Left;   CARDIAC CATHETERIZATION     CARDIOVERSION N/A 06/13/2018   Procedure: CARDIOVERSION;  Surgeon: Jolaine Artist, MD;  Location: [facility] ENDOSCOPY;  Service: Cardiovascular;  Laterality: N/A;   CORONARY STENT INTERVENTION N/A 07/27/2017   Procedure: CORONARY STENT INTERVENTION;  Surgeon: Martinique, Peter M, MD;  Location: [facility] CV LAB;  Service: Cardiovascular;  Laterality: N/A;   CORONARY/GRAFT ACUTE MI REVASCULARIZATION N/A 07/27/2017   Procedure: Coronary/Graft Acute MI Revascularization;  Surgeon: Martinique, Peter M, MD;  Location: [place] CV LAB;  Service: Cardiovascular;  Laterality: N/A;   LEFT HEART CATH AND CORONARY ANGIOGRAPHY N/A 07/27/2017   Procedure: LEFT HEART CATH AND CORONARY ANGIOGRAPHY;  Surgeon: Martinique, Peter M, MD;  Location: [place] CV LAB;  Service: Cardiovascular;  Laterality: N/A;   MITRAL VALVE REPAIR N/A 05/12/2020   Procedure: MITRAL VALVE REPAIR;  Surgeon: Sherren Mocha, MD;  Location: [place] CV LAB;  Service: Cardiovascular;  Laterality: N/A;   RE-EXCISION OF BREAST [name],SUPERIOR MARGINS  Left 11/04/2014   Procedure: RE-EXCISION OF LEFT BREAST INFERIOR MARGINS;  Surgeon: Autumn Messing III, MD;  Location: [facility];  Service: General;  Laterality: Left;   RIGHT HEART CATH N/A 04/05/2020   Procedure: RIGHT HEART CATH;  Surgeon: Jolaine Artist, MD;  Location: [place] CV LAB;  Service: Cardiovascular;  Laterality: N/A;   TEE WITHOUT CARDIOVERSION N/A 04/05/2020   Procedure: TRANSESOPHAGEAL ECHOCARDIOGRAM (TEE);  Surgeon: Jolaine Artist, MD;  Location: [facility] ENDOSCOPY;  Service: Cardiovascular;  Laterality: N/A;   TEE WITHOUT CARDIOVERSION N/A 05/12/2020   Procedure: TRANSESOPHAGEAL ECHOCARDIOGRAM (TEE);  Surgeon: Sherren Mocha, MD;  Location: [place] CV LAB;  Service: Cardiovascular;  Laterality: N/A;    SOCIAL HISTORY: Social History   Socioeconomic History   Marital status: Widowed    Spouse name: Not on file   Number of children: Not on file   Years of education: Not on file   Highest education level: Not on file  Occupational History   Occupation: retired  Tobacco Use   Smoking status: Never   Smokeless tobacco: Never  Vaping Use   Vaping Use: Never used  Substance and Sexual Activity   Alcohol use: No   Drug use: No   Sexual activity: Not on file  Other Topics Concern   Not on file  Social History Narrative   Not on  file   Social Determinants of Health   Financial Resource Strain: Not on file  Food Insecurity: Not on file  Transportation Needs: Not on file  Physical Activity: Not on file  Stress: Not on file  Social Connections: Not on file  Intimate Partner Violence: Not on file    FAMILY HISTORY: Family History  Problem Relation Age of Onset   Pancreatic [name] Mother        pancreastic   Stroke Father    Hypertension Brother    Hypertension Sister    Hypertension Brother    Heart disease Brother    Hypertension Sister     Review of Systems  Constitutional:  Negative for appetite change, chills, fatigue, fever and unexpected weight  change.  HENT:   Negative for hearing loss, lump/mass and trouble swallowing.   Eyes:  Negative for eye problems and icterus.  Respiratory:  Negative for chest tightness, cough and shortness of breath.   Cardiovascular:  Negative for chest pain, leg swelling and palpitations.  Gastrointestinal:  Negative for abdominal distention, abdominal pain, constipation, diarrhea, nausea and vomiting.  Endocrine: Negative for hot flashes.  Genitourinary:  Negative for difficulty urinating.   Musculoskeletal:  Negative for arthralgias.  Skin:  Negative for itching and rash.  Neurological:  Negative for dizziness, extremity weakness, headaches and numbness.  Hematological:  Negative for adenopathy. Does not bruise/bleed easily.  Psychiatric/Behavioral:  Negative for depression. The patient is not nervous/anxious.       PHYSICAL EXAMINATION  ECOG PERFORMANCE STATUS: 1 - Symptomatic but completely ambulatory  Vitals:   04/23/22 1215  BP: 136/83  Pulse: 93  Resp: 18  Temp: 97.9 F (36.6 C)  SpO2: 100%    Physical Exam Constitutional:      General: She is not in acute distress.    Appearance: Normal appearance. She is not toxic-appearing.  HENT:     Head: Normocephalic and atraumatic.  Eyes:     General: No scleral icterus. Cardiovascular:     Rate and Rhythm: Normal rate and regular rhythm.     Pulses: Normal pulses.     Heart sounds: Normal heart sounds.  Pulmonary:     Effort: Pulmonary effort is normal.     Breath sounds: Normal breath sounds.  Chest:     Comments: Left breast status postlumpectomy and radiation no sign of local recurrence right breast is benign. Abdominal:     General: Abdomen is flat. Bowel sounds are normal. There is no distension.     Palpations: Abdomen is soft.     Tenderness: There is no abdominal tenderness.  Musculoskeletal:        General: No swelling.     Cervical back: Neck supple.  Lymphadenopathy:     Cervical: No cervical adenopathy.  Skin:     General: Skin is warm and dry.     Findings: No rash.  Neurological:     General: No focal deficit present.     Mental Status: She is alert.  Psychiatric:        Mood and Affect: Mood normal.        Behavior: Behavior normal.     LABORATORY DATA:  None for this visit  ASSESSMENT and THERAPY PLAN:   Malignant neoplasm of lower-outer quadrant of left breast of female, estrogen receptor positive ([place]) Mary [name] is an 87 year old woman with history of stage Ia ER/PR positive breast [name] diagnosed in May 2016 status post lumpectomy, adjuvant radiation, and antiestrogen therapy with anastrozole for 5  years that she completed in 2021.  Mary [name] is doing well today and has no clinical or radiographic signs of breast [name] recurrence.  We discussed her health maintenance and can help get her bone density testing scheduled.  She will continue to undergo annual mammograms next due in December of this year.  I recommended healthy diet and continued activity. She will return in 1 year for continued f/u.     All questions were answered. The patient knows to call the clinic with any problems, questions or concerns. We can certainly see the patient much sooner if necessary.  Total encounter time:20 minutes*in face-to-face visit time, chart review, lab review, care coordination, order entry, and documentation of the encounter time.    Wilber Bihari, NP 04/27/22 8:16 AM Medical Oncology and Hematology [facility] [place], [place] [address] Tel. [phone]    Fax. [phone]  *Total Encounter Time as defined by the Centers for Medicare and Medicaid Services includes, in addition to the face-to-face time of a patient visit (documented in the note above) non-face-to-face time: obtaining and reviewing outside history, ordering and reviewing medications, tests or procedures, care coordination (communications with other health care professionals or caregivers) and  documentation in the medical record.

## 2022-04-23 NOTE — Telephone Encounter (Signed)
Called [name] to find out her schedule for scheduling MM and DEXA. [name] did not answer. LVM letting [name] know she will need to call [facility] to schedule her MM and DEXA.

## 2022-04-27 ENCOUNTER — Encounter: Payer: Self-pay | Admitting: Oncology

## 2022-04-27 ENCOUNTER — Telehealth: Payer: Self-pay

## 2022-04-27 NOTE — Telephone Encounter (Addendum)
Per NP request, called  [facility] to schedule pt for bone density. [facility] is closed today. Called both contact numbers on file to give pt [facility]'s phone number so she can get scheduled. Pt did not answer and I was unable to leave a VM.

## 2022-04-27 NOTE — Assessment & Plan Note (Signed)
[name] is an 87 year old woman with history of stage Ia ER/PR positive breast cancer diagnosed in May 2016 status post lumpectomy, adjuvant radiation, and antiestrogen therapy with anastrozole for 5 years that she completed in 2021.  [name] is doing well today and has no clinical or radiographic signs of breast cancer recurrence.  We discussed her health maintenance and can help get her bone density testing scheduled.  She will continue to undergo annual mammograms next due in December of this year.  I recommended healthy diet and continued activity. She will return in 1 year for continued f/u.

## 2022-06-30 ENCOUNTER — Other Ambulatory Visit (HOSPITAL_COMMUNITY): Payer: Self-pay | Admitting: Internal Medicine

## 2022-07-05 ENCOUNTER — Other Ambulatory Visit (HOSPITAL_COMMUNITY): Payer: Self-pay | Admitting: Internal Medicine

## 2022-07-12 ENCOUNTER — Telehealth: Payer: Self-pay | Admitting: Internal Medicine

## 2022-07-12 ENCOUNTER — Encounter: Payer: Self-pay | Admitting: Internal Medicine

## 2022-07-12 ENCOUNTER — Ambulatory Visit: Payer: Medicare Other | Attending: Internal Medicine | Admitting: Internal Medicine

## 2022-07-12 ENCOUNTER — Other Ambulatory Visit: Payer: Self-pay | Admitting: Hematology and Oncology

## 2022-07-12 VITALS — BP 150/80 | HR 65 | Ht 68.0 in | Wt 155.0 lb

## 2022-07-12 DIAGNOSIS — I5022 Chronic systolic (congestive) heart failure: Secondary | ICD-10-CM | POA: Diagnosis not present

## 2022-07-12 DIAGNOSIS — N184 Chronic kidney disease, stage 4 (severe): Secondary | ICD-10-CM

## 2022-07-12 DIAGNOSIS — D6869 Other thrombophilia: Secondary | ICD-10-CM

## 2022-07-12 DIAGNOSIS — M818 Other osteoporosis without current pathological fracture: Secondary | ICD-10-CM | POA: Diagnosis not present

## 2022-07-12 DIAGNOSIS — Z7689 Persons encountering health services in other specified circumstances: Secondary | ICD-10-CM

## 2022-07-12 DIAGNOSIS — I2510 Atherosclerotic heart disease of native coronary artery without angina pectoris: Secondary | ICD-10-CM | POA: Diagnosis not present

## 2022-07-12 DIAGNOSIS — I10 Essential (primary) hypertension: Secondary | ICD-10-CM | POA: Diagnosis not present

## 2022-07-12 DIAGNOSIS — D631 Anemia in chronic kidney disease: Secondary | ICD-10-CM

## 2022-07-12 DIAGNOSIS — I480 Paroxysmal atrial fibrillation: Secondary | ICD-10-CM | POA: Diagnosis not present

## 2022-07-12 DIAGNOSIS — Z853 Personal history of malignant neoplasm of breast: Secondary | ICD-10-CM

## 2022-07-12 NOTE — Telephone Encounter (Signed)
Copied from [place]. Topic: Referral - Request for Referral >> Jul 12, 2022  1:50 PM Everette C wrote: Has patient seen PCP for this complaint? Yes.   *If NO, is insurance requiring patient see PCP for this issue before PCP can refer them? Referral for which specialty: Nephrology  Preferred provider/office: Dr. Corliss Parish  Reason for referral: kidney concerns

## 2022-07-12 NOTE — Patient Instructions (Signed)
Please call [facility] to schedule your mammogram and bone density study.  You are overdue for these studies.  Please continue to monitor your blood pressure and record the readings.  Bring those readings with you in 1 month to see our clinical pharmacist.  Our goal for blood pressure is 130/80 or lower.

## 2022-07-12 NOTE — Progress Notes (Signed)
Patient ID: [name], female    DOB: September 11, 1934  MRN: [id number]  CC: Establish Care (Est care / new pt. Neoma Laming received flu vax. )   Subjective: [name] is a 88 y.o. female who presents for new patient visit.  Son Amiee Wiley Her concerns today include:  Patient with history of HTN, CAD (NSTEMI 07/2017 s/p DES Ramus), chronic CHFrEF 40-45%, MV repair with mod residual MR (needs SBE prophylaxis), A-fib on Eliquis, CKD 4 (followed by Dr. Moshe Cipro) , ACD, invasive mammary CA LT breast ER/PR+/HER2 -  Previous PCP became sick.  Last seen 2022.  Does not recall the name.  CAD/HTN/a.fib/CHF: -Patient followed in CHF clinic by Dr. Haroldine Laws.  Last saw him 11/2021.  She had repeat echo in August that revealed EF of 40% with severe asymmetric left ventricular hypertrophy and normal RV systolic function. -Denies any palpitations, PND orthopnea, lower extremity edema, chest pains at rest on exertion.  No increased bruising.  No bleeding.  She is on Eliquis 2.5 mg twice a day, furosemide 40 mg as needed, Cozaar 25 mg daily, spironolactone 25 mg daily, she is off aspirin.  Intolerant of carvedilol which causes fatigue.  She took medicines already for the morning. -She checks blood pressure at least twice a week at home.  Reports her readings have been good with reading yesterday of 134/71.  Reports increased readings sometimes when she comes to the doctor  CKD 4: She was being followed by Dr. Moshe Cipro with [facility].  She states it has been a few years since she was last seen.  Last creatinine was 1.93.  Range over the past 2 years has been 1.9-2.26.  Last GFR was 25 with range being 21-25.  Calcium level has been persistently elevated with last level being 10.9.  She is on Prolia through her oncologist.  Osteoporosis: She is on vitamin D 5000 IU twice a day.  Receives Prolia every 6 months.  She thinks her last shot was in the fall and looks like next 1 is  scheduled for April through her oncologist.  Arlean Hopping for bone density study.  It was ordered by her oncologist last year.  Patient states she needs to call the breast center with [facility] imaging to schedule but has not done so as yet.  Last was in 2018.  ACD: Thought to be due to chronic kidney disease.  She is taking iron supplement once a day over-the-counter.  Last colonoscopy was in 2009. Denies any blood in the stools at this time.  History of breast CVA treated with lumpectomy, XRT and antiestrogen therapy that she completed in 2021.  Overdue for mammogram.  This was ordered by her oncologist.  Patient states she has to call [facility] imaging to schedule and promises to do so.  Patient and her brother lives together.  She is independent in her ADLs including driving. Patient Active Problem List   Diagnosis Date Noted   Chronic systolic heart failure ([place]) 07/12/2022   Acquired thrombophilia ([place]) 07/12/2022   Nonrheumatic mitral (valve) insufficiency 05/12/2020   S/P mitral valve clip implantation 05/12/2020   Non-rheumatic mitral regurgitation 04/05/2020   Chronic idiopathic constipation 12/05/2017   Asymptomatic microscopic hematuria 12/05/2017   Unintentional weight loss 12/05/2017   Chronic kidney disease, stage IV (severe) ([place]) 12/05/2017   Anemia associated with chronic renal failure 12/05/2017   Hypercalcemia 12/05/2017   A-fib ([place])    Pressure injury of skin 07/28/2017   Osteoporosis 10/20/2014   Malignant  neoplasm of lower-outer quadrant of left breast of female, estrogen receptor positive ([place]) 10/11/2014   Pure hypercholesterolemia 04/08/2014   Anxiety    PUD (peptic ulcer disease)    HTN (hypertension), benign 06/29/2011   GERD (gastroesophageal reflux disease) 06/29/2011     Current Outpatient Medications on File Prior to Visit  Medication Sig Dispense Refill   acetaminophen (TYLENOL) 500 MG tablet Take 500-1,000 mg by mouth every 6 (six) hours as needed  (for pain.).     amoxicillin (AMOXIL) 500 MG tablet Take 4 tablets (2,000 mg total) 1 hour prior to all dental visits. 8 tablet 11   atorvastatin (LIPITOR) 80 MG tablet TAKE 1 TABLET BY MOUTH ONCE DAILY. APPOINTMENT REQUIRED FOR FUTURE REFILLS 60 tablet 0   B-12, Methylcobalamin, 1000 MCG SUBL Take 1,000 mcg by mouth daily.     Cholecalciferol (VITAMIN D) 125 MCG (5000 UT) CAPS Take by mouth in the morning and at bedtime.     Co-Enzyme Q10 100 MG CAPS Take by mouth 2 (two) times daily.     DHA-EPA-Vitamin E (OMEGA-3 COMPLEX PO) Take 1 tablet by mouth 2 (two) times daily.     ELIQUIS 2.5 MG TABS tablet Take 1 tablet by mouth twice daily 60 tablet 0   ferrous sulfate 325 (65 FE) MG tablet Take 325 mg by mouth daily.     furosemide (LASIX) 40 MG tablet Take 40 mg by mouth as needed.     losartan (COZAAR) 25 MG tablet TAKE 1 TABLET BY MOUTH IN THE MORNING AND AT BEDTIME 180 tablet 4   Magnesium 200 MG CHEW Chew 400 mg by mouth daily.     Multiple Vitamin (MULTIVITAMIN WITH MINERALS) TABS tablet Take 1 tablet by mouth daily.     nitroGLYCERIN (NITROSTAT) 0.3 MG SL tablet DISSOLVE ONE TABLET UNDER THE TONGUE EVERY 5 MINUTES AS NEEDED FOR CHEST PAIN.  DO NOT EXCEED A TOTAL OF 3 DOSES IN 15 MINUTES NOW 100 tablet 0   spironolactone (ALDACTONE) 25 MG tablet Take 1 tablet by mouth once daily 90 tablet 3   No current facility-administered medications on file prior to visit.    Allergies  Allergen Reactions   Lisinopril Cough    Social History   Socioeconomic History   Marital status: Widowed    Spouse name: Not on file   Number of children: Not on file   Years of education: Not on file   Highest education level: Not on file  Occupational History   Occupation: retired  Tobacco Use   Smoking status: Never   Smokeless tobacco: Never  Vaping Use   Vaping Use: Never used  Substance and Sexual Activity   Alcohol use: No   Drug use: No   Sexual activity: Not on file  Other Topics Concern    Not on file  Social History Narrative   Not on file   Social Determinants of Health   Financial Resource Strain: Not on file  Food Insecurity: Not on file  Transportation Needs: Not on file  Physical Activity: Not on file  Stress: Not on file  Social Connections: Not on file  Intimate Partner Violence: Not on file    Family History  Problem Relation Age of Onset   Pancreatic cancer Mother        pancreastic   Stroke Father    Hypertension Brother    Hypertension Sister    Hypertension Brother    Heart disease Brother    Hypertension Sister  Past Surgical History:  Procedure Laterality Date   BREAST LUMPECTOMY Left 2016   BREAST LUMPECTOMY WITH SENTINEL LYMPH NODE BIOPSY Left 10/22/2014   Procedure: BREAST LUMPECTOMY WITH SENTINEL LYMPH NODE BIOPSY;  Surgeon: Autumn Messing III, MD;  Location: [place];  Service: General;  Laterality: Left;   CARDIAC CATHETERIZATION     CARDIOVERSION N/A 06/13/2018   Procedure: CARDIOVERSION;  Surgeon: Jolaine Artist, MD;  Location: [facility] ENDOSCOPY;  Service: Cardiovascular;  Laterality: N/A;   CORONARY STENT INTERVENTION N/A 07/27/2017   Procedure: CORONARY STENT INTERVENTION;  Surgeon: Martinique, Peter M, MD;  Location: [place] CV LAB;  Service: Cardiovascular;  Laterality: N/A;   CORONARY/GRAFT ACUTE MI REVASCULARIZATION N/A 07/27/2017   Procedure: Coronary/Graft Acute MI Revascularization;  Surgeon: Martinique, Peter M, MD;  Location: [place] CV LAB;  Service: Cardiovascular;  Laterality: N/A;   LEFT HEART CATH AND CORONARY ANGIOGRAPHY N/A 07/27/2017   Procedure: LEFT HEART CATH AND CORONARY ANGIOGRAPHY;  Surgeon: Martinique, Peter M, MD;  Location: [place] CV LAB;  Service: Cardiovascular;  Laterality: N/A;   MITRAL VALVE REPAIR N/A 05/12/2020   Procedure: MITRAL VALVE REPAIR;  Surgeon: Sherren Mocha, MD;  Location: [place] CV LAB;  Service: Cardiovascular;  Laterality: N/A;   RE-EXCISION OF BREAST CANCER,SUPERIOR  MARGINS Left 11/04/2014   Procedure: RE-EXCISION OF LEFT BREAST INFERIOR MARGINS;  Surgeon: Autumn Messing III, MD;  Location: [place];  Service: General;  Laterality: Left;   RIGHT HEART CATH N/A 04/05/2020   Procedure: RIGHT HEART CATH;  Surgeon: Jolaine Artist, MD;  Location: [place] CV LAB;  Service: Cardiovascular;  Laterality: N/A;   TEE WITHOUT CARDIOVERSION N/A 04/05/2020   Procedure: TRANSESOPHAGEAL ECHOCARDIOGRAM (TEE);  Surgeon: Jolaine Artist, MD;  Location: [facility] ENDOSCOPY;  Service: Cardiovascular;  Laterality: N/A;   TEE WITHOUT CARDIOVERSION N/A 05/12/2020   Procedure: TRANSESOPHAGEAL ECHOCARDIOGRAM (TEE);  Surgeon: Sherren Mocha, MD;  Location: [place] CV LAB;  Service: Cardiovascular;  Laterality: N/A;    ROS: Review of Systems Negative except as stated above  PHYSICAL EXAM: BP (!) 150/80   Pulse 65   Ht '5\' 8"'$  (1.727 m)   Wt 155 lb (70.3 kg)   LMP  (LMP Unknown)   SpO2 96%   BMI 23.57 kg/m   Wt Readings from Last 3 Encounters:  07/12/22 155 lb (70.3 kg)  04/23/22 151 lb 1.6 oz (68.5 kg)  11/13/21 152 lb 6.4 oz (69.1 kg)    Physical Exam  General appearance - alert, well appearing, pleasant African-American female and in no distress Mental status - normal mood, behavior, speech, dress, motor activity, and thought processes Neck - supple, no significant adenopathy Chest - clear to auscultation, no wheezes, rales or rhonchi, symmetric air entry Heart -heart rate is irregularly irregular but rate controlled.  No gallops heard Extremities -no lower extremity edema     Latest Ref Rng & Units 03/09/2022   12:57 PM 11/13/2021   11:15 AM 09/07/2021    1:33 PM  CMP  Glucose 70 - 99 mg/dL 125  96  130   BUN 8 - 23 mg/dL 34  33  50   Creatinine 0.44 - 1.00 mg/dL 1.93  1.90  2.15   Sodium 135 - 145 mmol/L 139  138  140   Potassium 3.5 - 5.1 mmol/L 4.8  5.1  4.5   Chloride 98 - 111 mmol/L 107  108  109   CO2 22 - 32 mmol/L 28  22  26  Calcium 8.9 - 10.3 mg/dL  10.9  10.8  11.1   Total Protein 6.5 - 8.1 g/dL 7.5  6.8  7.3   Total Bilirubin 0.3 - 1.2 mg/dL 0.9  1.2  0.8   Alkaline Phos 38 - 126 U/L 43  33  35   AST 15 - 41 U/L '21  25  22   '$ ALT 0 - 44 U/L '17  23  17    '$ Lipid Panel     Component Value Date/Time   CHOL 130 11/13/2021 1115   CHOL 156 01/10/2018 1000   TRIG 25 11/13/2021 1115   HDL 69 11/13/2021 1115   HDL 74 01/10/2018 1000   CHOLHDL 1.9 11/13/2021 1115   VLDL 5 11/13/2021 1115   LDLCALC 56 11/13/2021 1115   LDLCALC 70 01/10/2018 1000    CBC    Component Value Date/Time   WBC 4.7 03/09/2022 1257   WBC 3.5 (L) 11/13/2021 1115   RBC 3.11 (L) 03/09/2022 1257   HGB 9.9 (L) 03/09/2022 1257   HGB 10.6 (L) 07/21/2019 1019   HGB 11.4 (L) 05/21/2017 0959   HCT 30.6 (L) 03/09/2022 1257   HCT 32.9 (L) 07/21/2019 1019   HCT 35.7 05/21/2017 0959   PLT 180 03/09/2022 1257   PLT 164 07/21/2019 1019   MCV 98.4 03/09/2022 1257   MCV 94 07/21/2019 1019   MCV 93.7 05/21/2017 0959   MCH 31.8 03/09/2022 1257   MCHC 32.4 03/09/2022 1257   RDW 12.7 03/09/2022 1257   RDW 13.0 07/21/2019 1019   RDW 12.4 05/21/2017 0959   LYMPHSABS 0.7 03/09/2022 1257   LYMPHSABS 1.1 12/03/2017 1519   LYMPHSABS 1.0 05/21/2017 0959   MONOABS 0.5 03/09/2022 1257   MONOABS 0.4 05/21/2017 0959   EOSABS 0.1 03/09/2022 1257   EOSABS 0.1 12/03/2017 1519   BASOSABS 0.0 03/09/2022 1257   BASOSABS 0.0 12/03/2017 1519   BASOSABS 0.0 05/21/2017 0959    ASSESSMENT AND PLAN: 1. Establishing care with new doctor, encounter for   2. Essential hypertension Not at goal but patient reports good readings at home. Advised that she continues to check blood pressure, write them down and bring them in to see our clinical pharmacist in 1 month.  Please be advised that patient is intolerant of carvedilol. She will continue current dose of Cozaar, spironolactone.  She takes furosemide when needed.  3. Chronic systolic heart failure (HCC) Stable and compensated.   Continue furosemide as needed, spironolactone and Cozaar.  4. Paroxysmal atrial fibrillation ([place]) Patient is rate controlled.  Continue Eliquis.  She has a mild stable anemia.  5. Acquired thrombophilia ([place]) Due to atrial fibs.  She is on Eliquis.  6. Chronic kidney disease, stage IV (severe) (HCC) Stable.  Advised of the importance of avoiding NSAIDs and need for good blood pressure control.  We will get her back in with her nephrologist for routine follow-up.. - Basic Metabolic Panel - Ambulatory referral to Nephrology - Parathyroid hormone, intact (no Ca)  7. Anemia due to stage 4 chronic kidney disease ([place]) Patient on daily iron supplement - Iron, TIBC and Ferritin Panel  8. History of invasive breast cancer Advised patient to call and get her mammogram scheduled.  9. Coronary artery disease involving native coronary artery of native heart without angina pectoris Stable.  Continue atorvastatin.  Not on aspirin.  10. Other osteoporosis without current pathological fracture On Prolia through her oncologist. - VITAMIN D 25 Hydroxy (Vit-D Deficiency, Fractures)    Patient was  given the opportunity to ask questions.  Patient verbalized understanding of the plan and was able to repeat key elements of the plan.   This documentation was completed using Radio producer.  Any transcriptional errors are unintentional.  Orders Placed This Encounter  Procedures   Iron, TIBC and Ferritin Panel   Basic Metabolic Panel   VITAMIN D 25 Hydroxy (Vit-D Deficiency, Fractures)   Parathyroid hormone, intact (no Ca)   Ambulatory referral to Nephrology     Requested Prescriptions    No prescriptions requested or ordered in this encounter    Return in about 4 months (around 11/10/2022) for Appt with [facility] in 2 wks for BP.  Give appt CMA for Medicare Wellness.  Karle Plumber, MD, FACP

## 2022-07-13 ENCOUNTER — Ambulatory Visit: Payer: Medicare Other | Attending: Internal Medicine

## 2022-07-13 DIAGNOSIS — Z0000 Encounter for general adult medical examination without abnormal findings: Secondary | ICD-10-CM

## 2022-07-13 DIAGNOSIS — Z0100 Encounter for examination of eyes and vision without abnormal findings: Secondary | ICD-10-CM

## 2022-07-13 LAB — IRON,TIBC AND FERRITIN PANEL
Ferritin: 121 ng/mL (ref 15–150)
Iron Saturation: 24 % (ref 15–55)
Iron: 77 ug/dL (ref 27–139)
Total Iron Binding Capacity: 327 ug/dL (ref 250–450)
UIBC: 250 ug/dL (ref 118–369)

## 2022-07-13 LAB — PARATHYROID HORMONE, INTACT (NO CA): PTH: 79 pg/mL — ABNORMAL HIGH (ref 15–65)

## 2022-07-13 LAB — BASIC METABOLIC PANEL
BUN/Creatinine Ratio: 17 (ref 12–28)
BUN: 27 mg/dL (ref 8–27)
CO2: 22 mmol/L (ref 20–29)
Calcium: 11.3 mg/dL — ABNORMAL HIGH (ref 8.7–10.3)
Chloride: 107 mmol/L — ABNORMAL HIGH (ref 96–106)
Creatinine, Ser: 1.62 mg/dL — ABNORMAL HIGH (ref 0.57–1.00)
Glucose: 86 mg/dL (ref 70–99)
Potassium: 5 mmol/L (ref 3.5–5.2)
Sodium: 143 mmol/L (ref 134–144)
eGFR: 31 mL/min/{1.73_m2} — ABNORMAL LOW (ref >59)

## 2022-07-13 LAB — VITAMIN D 25 HYDROXY (VIT D DEFICIENCY, FRACTURES): Vit D, 25-Hydroxy: 70.1 ng/mL (ref 30.0–100.0)

## 2022-07-13 NOTE — Patient Instructions (Addendum)
[name] , Thank you for taking time to come for your Medicare Wellness Visit. I appreciate your ongoing commitment to your health goals. Please review the following plan we discussed and let me know if I can assist you in the future.   These are the goals we discussed:  Goals   None     This is a list of the screening recommended for you and due dates:  Health Maintenance  Topic Date Due   DEXA scan (bone density measurement)  09/29/2018   Mammogram  05/12/2022   Zoster (Shingles) Vaccine (1 of 2) 07/24/2022*   COVID-19 Vaccine (7 - 2023-24 season) 09/03/2022*   Medicare Annual Wellness Visit  07/14/2023   DTaP/Tdap/Td vaccine (3 - Tdap) 09/08/2024   Pneumonia Vaccine  Completed   Flu Shot  Completed   HPV Vaccine  Aged Out  *Topic was postponed. The date shown is not the original due date.   Health Maintenance After Age 64 After age 40, you are at a higher risk for certain long-term diseases and infections as well as injuries from falls. Falls are a major cause of broken bones and head injuries in people who are older than age 42. Getting regular preventive care can help to keep you healthy and well. Preventive care includes getting regular testing and making lifestyle changes as recommended by your health care provider. Talk with your health care provider about: Which screenings and tests you should have. A screening is a test that checks for a disease when you have no symptoms. A diet and exercise plan that is right for you. What should I know about screenings and tests to prevent falls? Screening and testing are the best ways to find a health problem early. Early diagnosis and treatment give you the best chance of managing medical conditions that are common after age 80. Certain conditions and lifestyle choices may make you more likely to have a fall. Your health care provider may recommend: Regular vision checks. Poor vision and conditions such as cataracts can make you more  likely to have a fall. If you wear glasses, make sure to get your prescription updated if your vision changes. Medicine review. Work with your health care provider to regularly review all of the medicines you are taking, including over-the-counter medicines. Ask your health care provider about any side effects that may make you more likely to have a fall. Tell your health care provider if any medicines that you take make you feel dizzy or sleepy. Strength and balance checks. Your health care provider may recommend certain tests to check your strength and balance while standing, walking, or changing positions. Foot health exam. Foot pain and numbness, as well as not wearing proper footwear, can make you more likely to have a fall. Screenings, including: Osteoporosis screening. Osteoporosis is a condition that causes the bones to get weaker and break more easily. Blood pressure screening. Blood pressure changes and medicines to control blood pressure can make you feel dizzy. Depression screening. You may be more likely to have a fall if you have a fear of falling, feel depressed, or feel unable to do activities that you used to do. Alcohol use screening. Using too much alcohol can affect your balance and may make you more likely to have a fall. Follow these instructions at home: Lifestyle Do not drink alcohol if: Your health care provider tells you not to drink. If you drink alcohol: Limit how much you have to: 0-1 drink a day for  women. 0-2 drinks a day for men. Know how much alcohol is in your drink. In the [place] one drink equals one 12 oz bottle of beer (355 mL), one 5 oz glass of wine (148 mL), or one 1 oz glass of hard liquor (44 mL). Do not use any products that contain nicotine or tobacco. These products include cigarettes, chewing tobacco, and vaping devices, such as e-cigarettes. If you need help quitting, ask your health care provider. Activity  Follow a regular exercise program to stay  fit. This will help you maintain your balance. Ask your health care provider what types of exercise are appropriate for you. If you need a cane or walker, use it as recommended by your health care provider. Wear supportive shoes that have nonskid soles. Safety  Remove any tripping hazards, such as rugs, cords, and clutter. Install safety equipment such as grab bars in bathrooms and safety rails on stairs. Keep rooms and walkways well-lit. General instructions Talk with your health care provider about your risks for falling. Tell your health care provider if: You fall. Be sure to tell your health care provider about all falls, even ones that seem minor. You feel dizzy, tiredness (fatigue), or off-balance. Take over-the-counter and prescription medicines only as told by your health care provider. These include supplements. Eat a healthy diet and maintain a healthy weight. A healthy diet includes low-fat dairy products, low-fat (lean) meats, and fiber from whole grains, beans, and lots of fruits and vegetables. Stay current with your vaccines. Schedule regular health, dental, and eye exams. Summary Having a healthy lifestyle and getting preventive care can help to protect your health and wellness after age 45. Screening and testing are the best way to find a health problem early and help you avoid having a fall. Early diagnosis and treatment give you the best chance for managing medical conditions that are more common for people who are older than age 79. Falls are a major cause of broken bones and head injuries in people who are older than age 50. Take precautions to prevent a fall at home. Work with your health care provider to learn what changes you can make to improve your health and wellness and to prevent falls. This information is not intended to replace advice given to you by your health care provider. Make sure you discuss any questions you have with your health care provider. Document  Revised: 10/10/2020 Document Reviewed: 10/10/2020 Elsevier Patient Education  [place].

## 2022-07-13 NOTE — Progress Notes (Signed)
Subjective:   [name] is a 88 y.o. female who presents for Medicare Annual (Subsequent) preventive examination.  Review of Systems     I connected with [name] on 07/13/2022 at 10:30 am by telephone and verified that I am speaking with the correct person using two identifiers. I discussed the limitations, risks, security and privacy concerns of performing an evaluation and management service by telephone and the availability of in person appointments. I also discussed with the patient that there may be a patient responsible charge related to this service. The patient expressed understanding and agreed to proceed.   Patient location: Home My Location: [place] on the telephone call: Myself and [place]     Cardiac Risk Factors include: none     Objective:    There were no vitals filed for this visit. There is no height or weight on file to calculate BMI.     07/13/2022   10:40 AM 05/12/2020   10:20 AM 05/09/2020   10:34 AM 04/05/2020    9:10 AM 06/13/2018    9:15 AM 01/10/2018    9:08 AM 08/06/2017   10:07 AM  Advanced Directives  Does Patient Have a Medical Advance Directive? Yes Yes Yes Yes Yes Yes No  Type of Advance Directive  Living will;Healthcare Power of [place];Living will  [place];Living will    Does patient want to make changes to medical advance directive?  No - Guardian declined No - Patient declined      Copy of [place] in Chart?  No - copy requested No - copy requested  No - copy requested    Would patient like information on creating a medical advance directive?       No - Patient declined    Current Medications (verified) Outpatient Encounter Medications as of 07/13/2022  Medication Sig   acetaminophen (TYLENOL) 500 MG tablet Take 500-1,000 mg by mouth every 6 (six) hours as needed (for pain.).   atorvastatin (LIPITOR) 80 MG tablet TAKE 1 TABLET BY MOUTH ONCE  DAILY. APPOINTMENT REQUIRED FOR FUTURE REFILLS   B-12, Methylcobalamin, 1000 MCG SUBL Take 1,000 mcg by mouth daily.   Cholecalciferol (VITAMIN D) 125 MCG (5000 UT) CAPS Take by mouth in the morning and at bedtime.   Co-Enzyme Q10 100 MG CAPS Take by mouth 2 (two) times daily.   DHA-EPA-Vitamin E (OMEGA-3 COMPLEX PO) Take 1 tablet by mouth 2 (two) times daily.   ELIQUIS 2.5 MG TABS tablet Take 1 tablet by mouth twice daily   ferrous sulfate 325 (65 FE) MG tablet Take 325 mg by mouth daily.   furosemide (LASIX) 40 MG tablet Take 40 mg by mouth as needed.   losartan (COZAAR) 25 MG tablet TAKE 1 TABLET BY MOUTH IN THE MORNING AND AT BEDTIME   Magnesium 200 MG CHEW Chew 400 mg by mouth daily.   Multiple Vitamin (MULTIVITAMIN WITH MINERALS) TABS tablet Take 1 tablet by mouth daily.   nitroGLYCERIN (NITROSTAT) 0.3 MG SL tablet DISSOLVE ONE TABLET UNDER THE TONGUE EVERY 5 MINUTES AS NEEDED FOR CHEST PAIN.  DO NOT EXCEED A TOTAL OF 3 DOSES IN 15 MINUTES NOW   spironolactone (ALDACTONE) 25 MG tablet Take 1 tablet by mouth once daily   amoxicillin (AMOXIL) 500 MG tablet Take 4 tablets (2,000 mg total) 1 hour prior to all dental visits. (Patient not taking: Reported on 07/13/2022)   No facility-administered encounter medications on  file as of 07/13/2022.    Allergies (verified) Lisinopril   History: Past Medical History:  Diagnosis Date   Anemia    Anxiety    Atrial fibrillation (HCC)    Breast cancer of lower-outer quadrant of left female breast ([place]) 10/11/2014   treated with lumpectomy and radiation   Chronic systolic CHF (congestive heart failure) (HCC)    CKD (chronic kidney disease)    Coronary artery disease 07/27/2017   GERD (gastroesophageal reflux disease)    Hypertension    Mitral regurgitation    Myocardial infarction ([place]) 07/27/2017   STEMI of anterolateral wall. Cath w/ stent and cabg by Dr. Martinique   Osteoporosis 10/20/2014   Personal history of radiation therapy 2016   treatment  of breast cancer   PONV (postoperative nausea and vomiting)    pt denies this.   PUD (peptic ulcer disease)    Pure hypercholesterolemia 04/08/2014   S/P mitral valve clip implantation 05/12/2020   s/p TEER with two NTW Mitraclip devices by Dr. Clare Charon, kidney    Past Surgical History:  Procedure Laterality Date   BREAST LUMPECTOMY Left 2016   BREAST LUMPECTOMY WITH SENTINEL LYMPH NODE BIOPSY Left 10/22/2014   Procedure: BREAST LUMPECTOMY WITH SENTINEL LYMPH NODE BIOPSY;  Surgeon: Autumn Messing III, MD;  Location: [place];  Service: General;  Laterality: Left;   CARDIAC CATHETERIZATION     CARDIOVERSION N/A 06/13/2018   Procedure: CARDIOVERSION;  Surgeon: Jolaine Artist, MD;  Location: [facility] ENDOSCOPY;  Service: Cardiovascular;  Laterality: N/A;   CORONARY STENT INTERVENTION N/A 07/27/2017   Procedure: CORONARY STENT INTERVENTION;  Surgeon: Martinique, Peter M, MD;  Location: [place] CV LAB;  Service: Cardiovascular;  Laterality: N/A;   CORONARY/GRAFT ACUTE MI REVASCULARIZATION N/A 07/27/2017   Procedure: Coronary/Graft Acute MI Revascularization;  Surgeon: Martinique, Peter M, MD;  Location: [place] CV LAB;  Service: Cardiovascular;  Laterality: N/A;   LEFT HEART CATH AND CORONARY ANGIOGRAPHY N/A 07/27/2017   Procedure: LEFT HEART CATH AND CORONARY ANGIOGRAPHY;  Surgeon: Martinique, Peter M, MD;  Location: [place] CV LAB;  Service: Cardiovascular;  Laterality: N/A;   MITRAL VALVE REPAIR N/A 05/12/2020   Procedure: MITRAL VALVE REPAIR;  Surgeon: Sherren Mocha, MD;  Location: [place] CV LAB;  Service: Cardiovascular;  Laterality: N/A;   RE-EXCISION OF BREAST CANCER,SUPERIOR MARGINS Left 11/04/2014   Procedure: RE-EXCISION OF LEFT BREAST INFERIOR MARGINS;  Surgeon: Autumn Messing III, MD;  Location: [place];  Service: General;  Laterality: Left;   RIGHT HEART CATH N/A 04/05/2020   Procedure: RIGHT HEART CATH;  Surgeon: Jolaine Artist, MD;  Location: [place] CV LAB;   Service: Cardiovascular;  Laterality: N/A;   TEE WITHOUT CARDIOVERSION N/A 04/05/2020   Procedure: TRANSESOPHAGEAL ECHOCARDIOGRAM (TEE);  Surgeon: Jolaine Artist, MD;  Location: [facility] ENDOSCOPY;  Service: Cardiovascular;  Laterality: N/A;   TEE WITHOUT CARDIOVERSION N/A 05/12/2020   Procedure: TRANSESOPHAGEAL ECHOCARDIOGRAM (TEE);  Surgeon: Sherren Mocha, MD;  Location: [facility] CV LAB;  Service: Cardiovascular;  Laterality: N/A;   Family History  Problem Relation Age of Onset   Pancreatic cancer Mother        pancreastic   Stroke Father    Hypertension Brother    Hypertension Sister    Hypertension Brother    Heart disease Brother    Hypertension Sister    Social History   Socioeconomic History   Marital status: Widowed    Spouse name: Not on file   Number  of children: Not on file   Years of education: Not on file   Highest education level: Not on file  Occupational History   Occupation: retired  Tobacco Use   Smoking status: Never   Smokeless tobacco: Never  Vaping Use   Vaping Use: Never used  Substance and Sexual Activity   Alcohol use: No   Drug use: No   Sexual activity: Not on file  Other Topics Concern   Not on file  Social History Narrative   Not on file   Social Determinants of Health   Financial Resource Strain: Low Risk  (07/13/2022)   Overall Financial Resource Strain (CARDIA)    Difficulty of Paying Living Expenses: Not hard at all  Food Insecurity: No Food Insecurity (07/13/2022)   Hunger Vital Sign    Worried About Running Out of Food in the Last Year: Never true    [place] in the Last Year: Never true  Transportation Needs: No Transportation Needs (07/13/2022)   PRAPARE - Hydrologist (Medical): No    Lack of Transportation (Non-Medical): No  Physical Activity: Insufficiently Active (07/13/2022)   Exercise Vital Sign    Days of Exercise per Week: 2 days    Minutes of Exercise per Session: 20 min  Stress: No  Stress Concern Present (07/13/2022)   [place]    Feeling of Stress : Not at all  Social Connections: Moderately Integrated (07/13/2022)   Social Connection and Isolation Panel [NHANES]    Frequency of Communication with Friends and Family: Twice a week    Frequency of Social Gatherings with Friends and Family: Twice a week    Attends Religious Services: 1 to 4 times per year    Active Member of [employer] or Organizations: Yes    Attends Archivist Meetings: 1 to 4 times per year    Marital Status: Widowed    Tobacco Counseling Counseling given: No   Clinical Intake:  Pre-visit preparation completed: No  Pain : No/denies pain     Diabetes: No  How often do you need to have someone help you when you read instructions, pamphlets, or other written materials from your doctor or pharmacy?: 1 - Never  Diabetic?No  Interpreter Needed?: No      Activities of Daily Living    07/13/2022   10:40 AM 07/12/2022   12:18 PM  In your present state of health, do you have any difficulty performing the following activities:  Hearing? 0 0  Vision? 0 0  Difficulty concentrating or making decisions? 0 0  Walking or climbing stairs? 0 0  Dressing or bathing? 0 0  Doing errands, shopping? 0 0  Preparing Food and eating ? N N  Using the Toilet? N N  In the past six months, have you accidently leaked urine? N N  Do you have problems with loss of bowel control? N N  Managing your Medications? N N  Managing your Finances? N N  Housekeeping or managing your Housekeeping? N N    Patient Care Team: Ladell Pier, MD as PCP - General (Internal Medicine) Bensimhon, Shaune Pascal, MD as PCP - Advanced Heart Failure (Cardiology) Clarene Essex, MD as Consulting Physician (Gastroenterology) Larey Dresser, MD as Consulting Physician (Cardiology)  Indicate any recent Medical Services you may have received from other than [facility]  providers in the past year (date may be approximate).     Assessment:  This is a routine wellness examination for [facility].  Hearing/Vision screen No results found.  Dietary issues and exercise activities discussed: Current Exercise Habits: Home exercise routine, Type of exercise: walking, Time (Minutes): 20, Frequency (Times/Week): 2, Weekly Exercise (Minutes/Week): 40, Intensity: Mild, Exercise limited by: None identified   Goals Addressed   None    Depression Screen    07/21/2019    9:45 AM 01/20/2019    9:44 AM 01/17/2018    3:24 PM 01/10/2018    9:06 AM 12/03/2017    2:04 PM 10/14/2017    4:43 PM 07/11/2017    8:35 AM  PHQ 2/9 Scores  PHQ - 2 Score 0 0 0 0 0 0 0    Fall Risk    07/13/2022   10:40 AM 07/12/2022   12:18 PM 07/21/2019    9:45 AM 01/20/2019    9:44 AM 05/15/2018    9:39 AM  [place] in the past year? 0 0 0 0 0  Number falls in past yr: 0  0 0 0  Injury with Fall? 0  0 0 0  Follow up   Falls evaluation completed Falls evaluation completed     FALL RISK PREVENTION PERTAINING TO THE HOME:  Any stairs in or around the home? Yes  If so, are there any without handrails? No  Home free of loose throw rugs in walkways, pet beds, electrical cords, etc? Yes  Adequate lighting in your home to reduce risk of falls? Yes   ASSISTIVE DEVICES UTILIZED TO PREVENT FALLS:  Life alert? No  Use of a cane, walker or w/c? No  Grab bars in the bathroom? Yes  Shower chair or bench in shower? No  Elevated toilet seat or a handicapped toilet? No   TIMED UP AND GO:  Was the test performed? No .  Length of time to ambulate 10 feet: n/a sec.   Gait steady and fast without use of assistive device  Cognitive Function:    07/13/2022   10:46 AM  MMSE - Mini Mental State Exam  Not completed: Unable to complete        07/13/2022   10:46 AM  6CIT Screen  What Year? 0 points  What month? 0 points  What time? 0 points  Count back from 20 0 points  Months in reverse 0  points  Repeat phrase 0 points  Total Score 0 points    Immunizations Immunization History  Administered Date(s) Administered   Fluad Quad(high Dose 65+) 02/20/2022   Hepatitis B 07/15/2004, 08/19/2004, 01/12/2005   PFIZER Comirnaty(Gray Top)Covid-19 Tri-Sucrose Vaccine 02/20/2022   PFIZER(Purple Top)SARS-COV-2 Vaccination 06/26/2019, 07/17/2019, 03/22/2020   Pfizer Covid-19 Vaccine Bivalent Booster 35yr & up 11/03/2020, 04/08/2021   Pneumococcal Conjugate-13 11/05/2016   Pneumococcal Polysaccharide-23 01/10/2018   Td 07/15/2004, 09/09/2014    TDAP status: Up to date  Flu Vaccine status: Up to date  Pneumococcal vaccine status: Up to date  Covid-19 vaccine status: Completed vaccines  Qualifies for Shingles Vaccine? Yes   Zostavax completed No   Shingrix Completed?: No.    Education has been provided regarding the importance of this vaccine. Patient has been advised to call insurance company to determine out of pocket expense if they have not yet received this vaccine. Advised may also receive vaccine at local pharmacy or Health Dept. Verbalized acceptance and understanding.  Screening Tests Health Maintenance  Topic Date Due   DEXA SCAN  09/29/2018   Medicare Annual Wellness (AWV)  01/20/2020  MAMMOGRAM  05/12/2022   Zoster Vaccines- Shingrix (1 of 2) 07/24/2022 (Originally 05/14/1954)   COVID-19 Vaccine (7 - 2023-24 season) 09/03/2022 (Originally 04/17/2022)   DTaP/Tdap/Td (3 - Tdap) 09/08/2024   Pneumonia Vaccine 30+ Years old  Completed   INFLUENZA VACCINE  Completed   HPV VACCINES  Aged Out    Health Maintenance  Health Maintenance Due  Topic Date Due   DEXA SCAN  09/29/2018   Medicare Annual Wellness (AWV)  01/20/2020   MAMMOGRAM  05/12/2022    Colorectal cancer screening: No longer required.   Mammogram status: Ordered for 08/30/2022. Pt provided with contact info and advised to call to schedule appt.   Bone Density status: Ordered for 12/14/2022. Pt  provided with contact info and advised to call to schedule appt.  Lung Cancer Screening: (Low Dose CT Chest recommended if Age 77-80 years, 30 pack-year currently smoking OR have quit w/in 15years.) does not qualify.   Lung Cancer Screening Referral: No  Additional Screening:  Hepatitis C Screening: does not qualify  Vision Screening: Recommended annual ophthalmology exams for early detection of glaucoma and other disorders of the eye. Is the patient up to date with their annual eye exam?  No  Who is the provider or what is the name of the office in which the patient attends annual eye exams? N/a  If pt is not established with a provider, would they like to be referred to a provider to establish care? Yes .   Dental Screening: Recommended annual dental exams for proper oral hygiene  Community Resource Referral / Chronic Care Management: CRR required this visit?  No   CCM required this visit?  No      Plan:     I have personally reviewed and noted the following in the patient's chart:   Medical and social history Use of alcohol, tobacco or illicit drugs  Current medications and supplements including opioid prescriptions. Patient is not currently taking opioid prescriptions. Functional ability and status Nutritional status Physical activity Advanced directives List of other physicians Hospitalizations, surgeries, and ER visits in previous 12 months Vitals Screenings to include cognitive, depression, and falls Referrals and appointments  In addition, I have reviewed and discussed with patient certain preventive protocols, quality metrics, and best practice recommendations. A written personalized care plan for preventive services as well as general preventive health recommendations were provided to patient.     Gomez Cleverly, [place]   07/13/2022   Nurse Notes: I spent 30 minutes on this telephone encounter

## 2022-07-14 ENCOUNTER — Telehealth: Payer: Self-pay | Admitting: Internal Medicine

## 2022-07-14 DIAGNOSIS — E213 Hyperparathyroidism, unspecified: Secondary | ICD-10-CM

## 2022-07-14 NOTE — Telephone Encounter (Signed)
Phone call placed to patient this morning to go over lab results. Patient informed that her calcium level remains elevated.  I think she may have over functioning parathyroid glands.  I explained to her what the parathyroid glands are and what they do.  I recommend referral to an endocrinologist to evaluate further. Kidney function is still not 100% but slightly improved with GFR currently at 31.  Previously in the 48s. Vitamin D level is in the high normal range. All questions were answered.  Patient tells me that she has already called and scheduled appointment for her mammogram and bone density study.

## 2022-07-30 ENCOUNTER — Other Ambulatory Visit (HOSPITAL_COMMUNITY): Payer: Self-pay | Admitting: Internal Medicine

## 2022-08-06 ENCOUNTER — Telehealth: Payer: Self-pay | Admitting: Internal Medicine

## 2022-08-06 NOTE — Telephone Encounter (Signed)
Patient misunderstood the date of her appt. She will be able to keep her appt with me this Friday, 08/10/2022 '@11a'$ .

## 2022-08-06 NOTE — Telephone Encounter (Signed)
Pt is calling to reschedule her BP appt. Please advise CB- [phone]

## 2022-08-09 DIAGNOSIS — E210 Primary hyperparathyroidism: Secondary | ICD-10-CM | POA: Diagnosis not present

## 2022-08-09 DIAGNOSIS — I10 Essential (primary) hypertension: Secondary | ICD-10-CM | POA: Diagnosis not present

## 2022-08-09 DIAGNOSIS — I2510 Atherosclerotic heart disease of native coronary artery without angina pectoris: Secondary | ICD-10-CM | POA: Diagnosis not present

## 2022-08-09 DIAGNOSIS — M810 Age-related osteoporosis without current pathological fracture: Secondary | ICD-10-CM | POA: Diagnosis not present

## 2022-08-09 DIAGNOSIS — N189 Chronic kidney disease, unspecified: Secondary | ICD-10-CM | POA: Diagnosis not present

## 2022-08-09 DIAGNOSIS — I482 Chronic atrial fibrillation, unspecified: Secondary | ICD-10-CM | POA: Diagnosis not present

## 2022-08-10 ENCOUNTER — Ambulatory Visit: Payer: Medicare Other | Attending: Internal Medicine | Admitting: Pharmacist

## 2022-08-10 ENCOUNTER — Encounter: Payer: Self-pay | Admitting: Pharmacist

## 2022-08-10 VITALS — BP 129/73 | HR 72

## 2022-08-10 DIAGNOSIS — I10 Essential (primary) hypertension: Secondary | ICD-10-CM | POA: Diagnosis not present

## 2022-08-10 NOTE — Progress Notes (Signed)
   S:     No chief complaint on file.  88 y.o. female who presents for hypertension evaluation, education, and management. PMH is significant for HTN, A fib, mitral valve regurgitation, systolic heart failure w/ hx of acute anterior MI S/p placement of DES x2 (2019 - LVEF at that time was 35-40%) with most recent EF 40%. Patient was referred and last seen by Primary Care Provider, Dr. Wynetta Emery, on 07/12/2022. BP was 150/80 at that visit. No changes were made to medications. Of note, she saw her Endocrinologist yesterday and her BP was 110/72 mmHg.   Today, patient arrives in good spirits and presents with her son. Denies dizziness, headache, blurred vision, swelling.   Family/Social history:  Fhx: stroke, HTN, heart disease Tobacco: never smoker  Alcohol: none reported  Medication adherence reported. Patient has taken BP medications today.   Current antihypertensives include: losartan 25 mg BID, spironolactone 25 mg. Of note she is intolerant to carvedilol therapy.   Patient reported dietary habits:  -Compliant with salt restriction.  -Denies any excessive caffeine intae  Patient-reported exercise habits:  -None reported  O:  Vitals:   08/10/22 1108  BP: 129/73  Pulse: 72    Last 3 Office BP readings: BP Readings from Last 3 Encounters:  08/10/22 129/73  07/12/22 (!) 150/80  04/23/22 136/83    BMET    Component Value Date/Time   NA 143 07/12/2022 1015   NA 143 05/21/2017 0959   K 5.0 07/12/2022 1015   K 4.0 05/21/2017 0959   CL 107 (H) 07/12/2022 1015   CO2 22 07/12/2022 1015   CO2 26 05/21/2017 0959   GLUCOSE 86 07/12/2022 1015   GLUCOSE 125 (H) 03/09/2022 1257   GLUCOSE 86 05/21/2017 0959   BUN 27 07/12/2022 1015   BUN 16.7 05/21/2017 0959   CREATININE 1.62 (H) 07/12/2022 1015   CREATININE 1.93 (H) 03/09/2022 1257   CREATININE 1.1 05/21/2017 0959   CALCIUM 11.3 (H) 07/12/2022 1015   CALCIUM 10.3 05/21/2017 0959   GFRNONAA 25 (L) 03/09/2022 1257   GFRNONAA 63  09/09/2014 1150   GFRAA 20 (L) 03/01/2020 1350   GFRAA 72 09/09/2014 1150    Renal function: CrCl cannot be calculated (Patient's most recent lab result is older than the maximum 21 days allowed.).  Clinical ASCVD: Yes  The ASCVD Risk score (Arnett DK, et al., 2019) failed to calculate for the following reasons:   The 2019 ASCVD risk score is only valid for ages 61 to 29  A/P: Hypertension longstanding currently at [place] on current medications. BP goal < 130/80 mmHg. Medication adherence appears to be optimal.  -Continued current regimen.  -F/u labs ordered - none today -Counseled on lifestyle modifications for blood pressure control including reduced dietary sodium, increased exercise, adequate sleep. -Encouraged patient to check BP at home and bring log of readings to next visit. Counseled on proper use of home BP cuff.   Results reviewed and written information provided.    Written patient instructions provided. Patient verbalized understanding of treatment plan.  Total time in face to face counseling 15 minutes.    Follow-up:  Pharmacist prn. PCP clinic visit in June.   Benard Halsted, PharmD, [place], [place] [phone]

## 2022-08-20 DIAGNOSIS — E210 Primary hyperparathyroidism: Secondary | ICD-10-CM | POA: Diagnosis not present

## 2022-08-30 ENCOUNTER — Ambulatory Visit
Admission: RE | Admit: 2022-08-30 | Discharge: 2022-08-30 | Disposition: A | Discharge status: Home / Self Care | Payer: Medicare Other | Source: Ambulatory Visit | Attending: Hematology and Oncology | Admitting: Hematology and Oncology

## 2022-08-30 DIAGNOSIS — Z170 Estrogen receptor positive status [ER+]: Secondary | ICD-10-CM

## 2022-08-30 DIAGNOSIS — Z1231 Encounter for screening mammogram for malignant neoplasm of breast: Secondary | ICD-10-CM | POA: Diagnosis not present

## 2022-08-30 DIAGNOSIS — M818 Other osteoporosis without current pathological fracture: Secondary | ICD-10-CM

## 2022-09-03 ENCOUNTER — Other Ambulatory Visit (HOSPITAL_COMMUNITY): Payer: Self-pay | Admitting: Internal Medicine

## 2022-09-06 ENCOUNTER — Other Ambulatory Visit (HOSPITAL_COMMUNITY): Payer: Self-pay | Admitting: Internal Medicine

## 2022-09-06 ENCOUNTER — Other Ambulatory Visit: Payer: Self-pay

## 2022-09-06 DIAGNOSIS — Z170 Estrogen receptor positive status [ER+]: Secondary | ICD-10-CM

## 2022-09-10 ENCOUNTER — Encounter: Payer: Self-pay | Admitting: Hematology and Oncology

## 2022-09-10 ENCOUNTER — Inpatient Hospital Stay: Payer: Medicare Other

## 2022-09-10 ENCOUNTER — Inpatient Hospital Stay: Payer: Medicare Other | Attending: Hematology and Oncology | Admitting: Hematology and Oncology

## 2022-09-10 VITALS — BP 145/74 | HR 79 | Temp 97.7°F | Resp 16 | Ht 68.0 in | Wt 155.8 lb

## 2022-09-10 DIAGNOSIS — M818 Other osteoporosis without current pathological fracture: Secondary | ICD-10-CM

## 2022-09-10 DIAGNOSIS — Z170 Estrogen receptor positive status [ER+]: Secondary | ICD-10-CM

## 2022-09-10 DIAGNOSIS — M810 Age-related osteoporosis without current pathological fracture: Secondary | ICD-10-CM | POA: Insufficient documentation

## 2022-09-10 DIAGNOSIS — Z853 Personal history of malignant neoplasm of breast: Secondary | ICD-10-CM | POA: Insufficient documentation

## 2022-09-10 DIAGNOSIS — C50512 Malignant neoplasm of lower-outer quadrant of left female breast: Secondary | ICD-10-CM

## 2022-09-10 LAB — CBC WITH DIFFERENTIAL (CANCER CENTER ONLY)
Abs Immature Granulocytes: 0.01 10*3/uL (ref 0.00–0.07)
Basophils Absolute: 0 10*3/uL (ref 0.0–0.1)
Basophils Relative: 1 %
Eosinophils Absolute: 0.1 10*3/uL (ref 0.0–0.5)
Eosinophils Relative: 2 %
HCT: 31.6 % — ABNORMAL LOW (ref 36.0–46.0)
Hemoglobin: 10 g/dL — ABNORMAL LOW (ref 12.0–15.0)
Immature Granulocytes: 0 %
Lymphocytes Relative: 13 %
Lymphs Abs: 0.6 10*3/uL — ABNORMAL LOW (ref 0.7–4.0)
MCH: 31.3 pg (ref 26.0–34.0)
MCHC: 31.6 g/dL (ref 30.0–36.0)
MCV: 98.8 fL (ref 80.0–100.0)
Monocytes Absolute: 0.4 10*3/uL (ref 0.1–1.0)
Monocytes Relative: 10 %
Neutro Abs: 3.3 10*3/uL (ref 1.7–7.7)
Neutrophils Relative %: 74 %
Platelet Count: 200 10*3/uL (ref 150–400)
RBC: 3.2 MIL/uL — ABNORMAL LOW (ref 3.87–5.11)
RDW: 12.7 % (ref 11.5–15.5)
WBC Count: 4.4 10*3/uL (ref 4.0–10.5)
nRBC: 0 % (ref 0.0–0.2)

## 2022-09-10 LAB — CMP (CANCER CENTER ONLY)
ALT: 16 U/L (ref 0–44)
AST: 19 U/L (ref 15–41)
Albumin: 4.3 g/dL (ref 3.5–5.0)
Alkaline Phosphatase: 34 U/L — ABNORMAL LOW (ref 38–126)
Anion gap: 6 (ref 5–15)
BUN: 35 mg/dL — ABNORMAL HIGH (ref 8–23)
CO2: 25 mmol/L (ref 22–32)
Calcium: 11.2 mg/dL — ABNORMAL HIGH (ref 8.9–10.3)
Chloride: 110 mmol/L (ref 98–111)
Creatinine: 1.47 mg/dL — ABNORMAL HIGH (ref 0.44–1.00)
GFR, Estimated: 35 mL/min — ABNORMAL LOW (ref >60)
Glucose, Bld: 117 mg/dL — ABNORMAL HIGH (ref 70–99)
Potassium: 4.6 mmol/L (ref 3.5–5.1)
Sodium: 141 mmol/L (ref 135–145)
Total Bilirubin: 0.7 mg/dL (ref 0.3–1.2)
Total Protein: 7 g/dL (ref 6.5–8.1)

## 2022-09-10 MED ORDER — DENOSUMAB 60 MG/ML ~~LOC~~ SOSY
60.0000 mg | PREFILLED_SYRINGE | Freq: Once | SUBCUTANEOUS | Status: AC
  Administered 2022-09-10: 60 mg via SUBCUTANEOUS
  Filled 2022-09-10: qty 1

## 2022-09-10 NOTE — Assessment & Plan Note (Addendum)
ASSESSMENT: 88 y.o.  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   (2) radiation  12/21/2014-01/11/2015:   Left Breast / 42.56 Gy in 16 fractions    (3) anastrozole started September 2016, completing 5 years September 2021             (a) bone density 09/27/2014 shows osteoporosis             (b) denosumab/Prolia started 05/19/2015, to be repeated every 6 months.             (c) repeat bone density 09/28/2016 showed a T score of -3.3; this was stable     PLAN:  [name] has completed 5 years of antiestrogen therapy but elected to continue with oncology once a year.  No concerning review of systems today.  Physical examination without any concern for recurrence. Most recent mammogram without any abnormal findings.  No concerns on physical exam today. No new dental concerns, okay to proceed with Prolia if labs are within parameters.  She will return to clinic in 6 months for labs and Prolia and follow-up in 1 year.  Mammogram ordered for March 2025. She will return to clinic in 1 year.

## 2022-09-10 NOTE — Progress Notes (Signed)
[facility] Cancer Follow up:    Mary Matar, MD [address] [place] [address] [place] [address]   DIAGNOSIS:  Cancer Staging  Malignant neoplasm of lower-outer quadrant of left breast of female, estrogen receptor positive Staging form: Breast, AJCC 7th Edition - Clinical stage from 10/13/2014: Stage IA (T1c, N0, M0) - Unsigned Staged by: Pathologist and managing physician Laterality: Left Estrogen receptor status: Positive Progesterone receptor status: Positive HER2 status: Negative Stage used in treatment planning: Yes National guidelines used in treatment planning: Yes Type of national guideline used in treatment planning: NCCN Staging comments: Staged in breast conference on 5.11.16 - Pathologic stage from 10/22/2014: Stage IA (T1c, N0, cM0) - Unsigned   SUMMARY OF ONCOLOGIC HISTORY: Oncology History  Malignant neoplasm of lower-outer quadrant of left breast of female, estrogen receptor positive  09/27/2014 Mammogram   Left breast: new irregularly-shaped mass outer quadrant corresponding to palpable area of concern. 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   09/27/2014 Breast US   Left breast: irregular hypoechoic mass with prominent internal vascular flow at 3:30 position 2 cm from the nipple that measures 2.0 x 1.9 x 1.2 cm.   10/05/2014 Initial Biopsy   Left breast core needle biopsy: Invasive mannary carcinoma with extravasated mucin, grade 1, ER+ (90%), PR+ (90%), HER2/neu negative (ratio 1.06), Ki67 5%.   10/05/2014 Clinical Stage   Stage IA: T1c N0   10/22/2014 Definitive Surgery   Left lumpectomy/[facility] [name]): Invasive mammary carcinoma, mixed mucinous and ductal variant, HER2/neu repeated and remains negative (ratio 1.28), positive margins,  1 LN removed and negative for malignancy.   10/22/2014 Pathologic Stage   Stage IA: pT1c pN0   11/04/2014 Surgery   Re-excision of left breast margins: clear; no evidence of  residual tumor.   12/21/2014 - 01/11/2015 Radiation Therapy   Adjuvant RT Basilio Cairo): Left Breast 42.56 Gy over 16 fractions   02/10/2015 -  Anti-estrogen oral therapy   Anastrozole 1 mg daily.  Planned duration of therapy 5 years    Survivorship   Survivorship care plan completed and mailed to patient in lieu of in person visit.     CURRENT THERAPY: Observation  INTERVAL HISTORY:  [name] 88 y.o. female returns for f/u of her h/o breast cancer.  Last mammogram done on August 30, 2022 with no mammographic evidence of malignancy. Since her last visit here, she denies any new health issues.  No new dental concerns.  She gets Prolia every 6 months.  Her next bone density scheduled for July 2024.  Rest of the pertinent 10 point ROS reviewed and negative  Patient Active Problem List   Diagnosis Date Noted   Chronic systolic heart failure 07/12/2022   Acquired thrombophilia 07/12/2022   Nonrheumatic mitral (valve) insufficiency 05/12/2020   S/P mitral valve clip implantation 05/12/2020   Non-rheumatic mitral regurgitation 04/05/2020   Chronic idiopathic constipation 12/05/2017   Asymptomatic microscopic hematuria 12/05/2017   Unintentional weight loss 12/05/2017   Chronic kidney disease, stage IV (severe) 12/05/2017   Anemia associated with chronic renal failure 12/05/2017   Hypercalcemia 12/05/2017   A-fib    Pressure injury of skin 07/28/2017   Osteoporosis 10/20/2014   Malignant neoplasm of lower-outer quadrant of left breast of female, estrogen receptor positive 10/11/2014   Pure hypercholesterolemia 04/08/2014   Anxiety    PUD (peptic ulcer disease)    HTN (hypertension), benign 06/29/2011   GERD (gastroesophageal reflux disease) 06/29/2011    is allergic  to lisinopril.  MEDICAL HISTORY: Past Medical History:  Diagnosis Date   Anemia    Anxiety    Atrial fibrillation    Breast cancer of lower-outer quadrant of left female breast 10/11/2014   treated with lumpectomy  and radiation   Chronic systolic CHF (congestive heart failure)    CKD (chronic kidney disease)    Coronary artery disease 07/27/2017   GERD (gastroesophageal reflux disease)    Hypertension    Mitral regurgitation    Myocardial infarction 07/27/2017   STEMI of anterolateral wall. Cath w/ stent and cabg by Dr. Swaziland   Osteoporosis 10/20/2014   Personal history of radiation therapy 2016   treatment of breast cancer   PONV (postoperative nausea and vomiting)    pt denies this.   PUD (peptic ulcer disease)    Pure hypercholesterolemia 04/08/2014   S/P mitral valve clip implantation 05/12/2020   s/p TEER with two NTW Mitraclip devices by Dr. Torrie Mayers, kidney     SURGICAL HISTORY: Past Surgical History:  Procedure Laterality Date   BREAST LUMPECTOMY Left 2016   BREAST LUMPECTOMY WITH SENTINEL LYMPH NODE BIOPSY Left 10/22/2014   Procedure: BREAST LUMPECTOMY WITH SENTINEL LYMPH NODE BIOPSY;  Surgeon: Chevis Pretty III, MD;  Location: [facility];  Service: General;  Laterality: Left;   CARDIAC CATHETERIZATION     CARDIOVERSION N/A 06/13/2018   Procedure: CARDIOVERSION;  Surgeon: Dolores Patty, MD;  Location: [facility] ENDOSCOPY;  Service: Cardiovascular;  Laterality: N/A;   CORONARY STENT INTERVENTION N/A 07/27/2017   Procedure: CORONARY STENT INTERVENTION;  Surgeon: Swaziland, Peter M, MD;  Location: [facility] INVASIVE CV LAB;  Service: Cardiovascular;  Laterality: N/A;   CORONARY/GRAFT ACUTE MI REVASCULARIZATION N/A 07/27/2017   Procedure: Coronary/Graft Acute MI Revascularization;  Surgeon: Swaziland, Peter M, MD;  Location: [facility] INVASIVE CV LAB;  Service: Cardiovascular;  Laterality: N/A;   LEFT HEART CATH AND CORONARY ANGIOGRAPHY N/A 07/27/2017   Procedure: LEFT HEART CATH AND CORONARY ANGIOGRAPHY;  Surgeon: Swaziland, Peter M, MD;  Location: [facility] INVASIVE CV LAB;  Service: Cardiovascular;  Laterality: N/A;   MITRAL VALVE REPAIR N/A 05/12/2020   Procedure: MITRAL VALVE REPAIR;  Surgeon:  Tonny Bollman, MD;  Location: [facility] INVASIVE CV LAB;  Service: Cardiovascular;  Laterality: N/A;   RE-EXCISION OF BREAST CANCER,SUPERIOR MARGINS Left 11/04/2014   Procedure: RE-EXCISION OF LEFT BREAST INFERIOR MARGINS;  Surgeon: Chevis Pretty III, MD;  Location: [facility] OR;  Service: General;  Laterality: Left;   RIGHT HEART CATH N/A 04/05/2020   Procedure: RIGHT HEART CATH;  Surgeon: Dolores Patty, MD;  Location: [facility] INVASIVE CV LAB;  Service: Cardiovascular;  Laterality: N/A;   TEE WITHOUT CARDIOVERSION N/A 04/05/2020   Procedure: TRANSESOPHAGEAL ECHOCARDIOGRAM (TEE);  Surgeon: Dolores Patty, MD;  Location: [facility] ENDOSCOPY;  Service: Cardiovascular;  Laterality: N/A;   TEE WITHOUT CARDIOVERSION N/A 05/12/2020   Procedure: TRANSESOPHAGEAL ECHOCARDIOGRAM (TEE);  Surgeon: Tonny Bollman, MD;  Location: [facility] INVASIVE CV LAB;  Service: Cardiovascular;  Laterality: N/A;    SOCIAL HISTORY: Social History   Socioeconomic History   Marital status: Widowed    Spouse name: Not on file   Number of children: Not on file   Years of education: Not on file   Highest education level: Not on file  Occupational History   Occupation: retired  Tobacco Use   Smoking status: Never   Smokeless tobacco: Never  Vaping Use   Vaping Use: Never used  Substance and Sexual Activity   Alcohol use: No  Drug use: No   Sexual activity: Not on file  Other Topics Concern   Not on file  Social History Narrative   Not on file   Social Determinants of Health   Financial Resource Strain: Low Risk  (08/10/2022)   Overall Financial Resource Strain (CARDIA)    Difficulty of Paying Living Expenses: Not hard at all  Food Insecurity: No Food Insecurity (08/10/2022)   Hunger Vital Sign    Worried About Running Out of Food in the Last Year: Never true    Ran Out of Food in the Last Year: Never true  Transportation Needs: No Transportation Needs (08/10/2022)   PRAPARE - Administrator, Civil Service (Medical): No     Lack of Transportation (Non-Medical): No  Physical Activity: Insufficiently Active (08/10/2022)   Exercise Vital Sign    Days of Exercise per Week: 2 days    Minutes of Exercise per Session: 20 min  Stress: No Stress Concern Present (08/10/2022)   [employer] of Occupational Health - Occupational Stress Questionnaire    Feeling of Stress : Not at all  Social Connections: Moderately Integrated (08/10/2022)   Social Connection and Isolation Panel [NHANES]    Frequency of Communication with Friends and Family: Twice a week    Frequency of Social Gatherings with Friends and Family: Twice a week    Attends Religious Services: 1 to 4 times per year    Active Member of [employer] or Organizations: Yes    Attends Banker Meetings: 1 to 4 times per year    Marital Status: Widowed  Intimate Partner Violence: Not At Risk (08/10/2022)   Humiliation, Afraid, Rape, and Kick questionnaire    Fear of Current or Ex-Partner: No    Emotionally Abused: No    Physically Abused: No    Sexually Abused: No    FAMILY HISTORY: Family History  Problem Relation Age of Onset   Pancreatic cancer Mother        pancreastic   Stroke Father    Hypertension Brother    Hypertension Sister    Hypertension Brother    Heart disease Brother    Hypertension Sister     Review of Systems  Constitutional:  Negative for appetite change, chills, fatigue, fever and unexpected weight change.  HENT:   Negative for hearing loss, lump/mass and trouble swallowing.   Eyes:  Negative for eye problems and icterus.  Respiratory:  Negative for chest tightness, cough and shortness of breath.   Cardiovascular:  Negative for chest pain, leg swelling and palpitations.  Gastrointestinal:  Negative for abdominal distention, abdominal pain, constipation, diarrhea, nausea and vomiting.  Endocrine: Negative for hot flashes.  Genitourinary:  Negative for difficulty urinating.   Musculoskeletal:  Negative for arthralgias.  Skin:   Negative for itching and rash.  Neurological:  Negative for dizziness, extremity weakness, headaches and numbness.  Hematological:  Negative for adenopathy. Does not bruise/bleed easily.  Psychiatric/Behavioral:  Negative for depression. The patient is not nervous/anxious.       PHYSICAL EXAMINATION  ECOG PERFORMANCE STATUS: 1 - Symptomatic but completely ambulatory  Vitals:   09/10/22 1302  BP: (!) 145/74  Pulse: 79  Resp: 16  Temp: 97.7 F (36.5 C)  SpO2: 100%    Physical Exam Constitutional:      General: She is not in acute distress.    Appearance: Normal appearance. She is not toxic-appearing.  HENT:     Head: Normocephalic and atraumatic.  Eyes:  General: No scleral icterus. Cardiovascular:     Rate and Rhythm: Normal rate and regular rhythm.     Pulses: Normal pulses.     Heart sounds: Normal heart sounds.  Pulmonary:     Effort: Pulmonary effort is normal.     Breath sounds: Normal breath sounds.  Chest:     Comments: Left breast status postlumpectomy and radiation no sign of local recurrence right breast is benign. Abdominal:     General: Abdomen is flat. Bowel sounds are normal. There is no distension.     Palpations: Abdomen is soft.     Tenderness: There is no abdominal tenderness.  Musculoskeletal:        General: No swelling.     Cervical back: Neck supple.  Lymphadenopathy:     Cervical: No cervical adenopathy.  Skin:    General: Skin is warm and dry.     Findings: No rash.  Neurological:     General: No focal deficit present.     Mental Status: She is alert.  Psychiatric:        Mood and Affect: Mood normal.        Behavior: Behavior normal.     LABORATORY DATA:  None for this visit  ASSESSMENT and THERAPY PLAN:   Malignant neoplasm of lower-outer quadrant of left breast of female, estrogen receptor positive (HCC) ASSESSMENT: 88 y.o.  [place] woman status post left breast lower outer quadrant biopsy 10/05/2014 for a clinical T1  N0, stage IA invasive ductal breast cancer, grade 1, estrogen and progesterone receptor positive, HER-2 not amplified, with an MIB-1 of 5%    (1) left lumpectomy and sentinel lymph node sampling 10/22/2014 showed a pT1c pN0, stage IA mixed ductal and mucinous invasive carcinoma, grade 2, repeat HER-2 again negative             (a) positive margins were cleared with subsequent surgery 11/04/2014.   (2) radiation  12/21/2014-01/11/2015:   Left Breast / 42.56 Gy in 16 fractions    (3) anastrozole started September 2016, completing 5 years September 2021             (a) bone density 09/27/2014 shows osteoporosis             (b) denosumab/Prolia started 05/19/2015, to be repeated every 6 months.             (c) repeat bone density 09/28/2016 showed a T score of -3.3; this was stable     PLAN:  [name] has completed 5 years of antiestrogen therapy but elected to continue with oncology once a year.  No concerning review of systems today.  Physical examination without any concern for recurrence. Most recent mammogram without any abnormal findings.  No concerns on physical exam today. No new dental concerns, okay to proceed with Prolia if labs are within parameters.  She will return to clinic in 6 months for labs and Prolia and follow-up in 1 year.  Mammogram ordered for March 2025. She will return to clinic in 1 year.   All questions were answered. The patient knows to call the clinic with any problems, questions or concerns. We can certainly see the patient much sooner if necessary.  Total encounter time:20 minutes*in face-to-face visit time, chart review, lab review, care coordination, order entry, and documentation of the encounter time. *Total Encounter Time as defined by the Centers for Medicare and Medicaid Services includes, in addition to the face-to-face time of a patient visit (documented in the note  above) non-face-to-face time: obtaining and reviewing outside history, ordering and reviewing  medications, tests or procedures, care coordination (communications with other health care professionals or caregivers) and documentation in the medical record.

## 2022-09-11 ENCOUNTER — Telehealth: Payer: Self-pay | Admitting: Hematology and Oncology

## 2022-09-11 ENCOUNTER — Telehealth: Payer: Self-pay | Admitting: Medical Oncology

## 2022-09-11 NOTE — Telephone Encounter (Signed)
Left patient a vm regarding upcoming appointment  

## 2022-09-11 NOTE — Telephone Encounter (Signed)
Pt returned a call.

## 2022-10-04 ENCOUNTER — Other Ambulatory Visit (HOSPITAL_COMMUNITY): Payer: Self-pay | Admitting: Internal Medicine

## 2022-10-09 ENCOUNTER — Other Ambulatory Visit: Payer: Self-pay | Admitting: Nephrology

## 2022-10-09 DIAGNOSIS — N184 Chronic kidney disease, stage 4 (severe): Secondary | ICD-10-CM

## 2022-10-09 DIAGNOSIS — I4891 Unspecified atrial fibrillation: Secondary | ICD-10-CM | POA: Diagnosis not present

## 2022-10-09 DIAGNOSIS — Z853 Personal history of malignant neoplasm of breast: Secondary | ICD-10-CM | POA: Diagnosis not present

## 2022-10-09 DIAGNOSIS — D631 Anemia in chronic kidney disease: Secondary | ICD-10-CM | POA: Diagnosis not present

## 2022-10-09 DIAGNOSIS — N189 Chronic kidney disease, unspecified: Secondary | ICD-10-CM | POA: Diagnosis not present

## 2022-10-09 DIAGNOSIS — I129 Hypertensive chronic kidney disease with stage 1 through stage 4 chronic kidney disease, or unspecified chronic kidney disease: Secondary | ICD-10-CM | POA: Diagnosis not present

## 2022-10-10 LAB — LAB REPORT - SCANNED
Creatinine, POC: 96.4 mg/dL
EGFR: 29

## 2022-10-24 ENCOUNTER — Encounter: Payer: Self-pay | Admitting: Internal Medicine

## 2022-10-24 NOTE — Progress Notes (Signed)
Pt was seen by nephrologist Dr. Ronalee Belts on 10/09/2022. Diagnoses: CKD stage IV.  GFR 29/creatinine 1.71/calcium 11.2/vitamin D 73.3 Anemia associated with chronic renal failure: H/H 11/33.9, MCV 96, iron 70/iron saturation 21%/ferritin 117/TIBC 326 Hypercalcemia, hypophosphatemia and inappropriately elevated PTH 113: Concerning for primary hyperthyroidism.  She has had hypercalcemia for a long time.  Patient declined doing a scan and does not want to do any invasive or surgical procedure.  She is willing to stop vitamins D and calcium supplement.  Plan to monitor lab.  She is supposed to get Prolia injection with her PCP which should help.

## 2022-10-30 ENCOUNTER — Other Ambulatory Visit (HOSPITAL_COMMUNITY): Payer: Self-pay | Admitting: Internal Medicine

## 2022-11-03 ENCOUNTER — Other Ambulatory Visit (HOSPITAL_COMMUNITY): Payer: Self-pay | Admitting: Internal Medicine

## 2022-11-12 ENCOUNTER — Encounter: Payer: Self-pay | Admitting: Internal Medicine

## 2022-11-12 ENCOUNTER — Ambulatory Visit: Payer: Medicare Other | Attending: Internal Medicine | Admitting: Internal Medicine

## 2022-11-12 VITALS — BP 129/76 | HR 66 | Temp 97.8°F | Ht 68.0 in | Wt 153.0 lb

## 2022-11-12 DIAGNOSIS — I480 Paroxysmal atrial fibrillation: Secondary | ICD-10-CM

## 2022-11-12 DIAGNOSIS — I10 Essential (primary) hypertension: Secondary | ICD-10-CM | POA: Diagnosis not present

## 2022-11-12 DIAGNOSIS — N184 Chronic kidney disease, stage 4 (severe): Secondary | ICD-10-CM | POA: Diagnosis not present

## 2022-11-12 DIAGNOSIS — I5022 Chronic systolic (congestive) heart failure: Secondary | ICD-10-CM | POA: Diagnosis not present

## 2022-11-12 DIAGNOSIS — M818 Other osteoporosis without current pathological fracture: Secondary | ICD-10-CM | POA: Diagnosis not present

## 2022-11-12 DIAGNOSIS — E210 Primary hyperparathyroidism: Secondary | ICD-10-CM | POA: Insufficient documentation

## 2022-11-12 DIAGNOSIS — I2510 Atherosclerotic heart disease of native coronary artery without angina pectoris: Secondary | ICD-10-CM | POA: Diagnosis not present

## 2022-11-12 DIAGNOSIS — D631 Anemia in chronic kidney disease: Secondary | ICD-10-CM

## 2022-11-12 NOTE — Progress Notes (Signed)
Patient ID: [name], female    DOB: [birth year]  MRN: [id number]  CC: Follow-up (Follow up. /No questions/ concerns/No shingles vax.)   Subjective: [name] is a 88 y.o. female who presents for chronic ds management Her concerns today include:  Patient with history of HTN, CAD (NSTEMI 07/2017 s/p DES Ramus), chronic CHFrEF 40-45%, MV repair with mod residual MR (needs SBE prophylaxis), A-fib on Eliquis, CKD 4 (followed by Dr. Kathrene Bongo) , ACD, invasive mammary CA LT breast ER/PR+/HER2 -   CKD 4:  Pt was seen by nephrologist Dr. Ronalee Belts on 10/09/2022. Diagnoses: CKD stage IV.  GFR 29/creatinine 1.71/calcium 11.2/vitamin D 73.3 Anemia associated with chronic renal failure: H/H 11/33.9, MCV 96, iron 70/iron saturation 21%/ferritin 117/TIBC 326 Hypercalcemia, hypophosphatemia and inappropriately elevated PTH 113: Concerning for primary hyperparathyroidism.  She has had hypercalcemia for a long time.  Patient declined doing a scan and does not want to do any invasive or surgical procedure.  She is willing to stop vitamins D and calcium supplement.  Plan to monitor lab.  -pt states she stopped Ca. She has decreased Vit D 5000 IU to once a day.   -I referred her to endocrinology.  Did see Dr. Talmage Nap for the hyperparathyroid since last visit with me.  I have not received Dr. Willeen Cass note.  CAD/HTN/a.fib/CHF:  Denies any palpitations, PND orthopnea, lower extremity edema, chest pains at rest on exertion.  No increased bruising.  No bleeding.  She is on Eliquis 2.5 mg twice a day, furosemide 40 mg as needed, Cozaar 25 mg daily, spironolactone 25 mg daily, she is off aspirin. Should be on Lipitor 80 mg but reports she self decreased to only 1/2 tab daily. Denies any cramps/muscle aches.  Last LDL 56.   Intolerant of carvedilol which causes fatigue.  She took medicines already for the morning.   Saw Dr. Al Pimple 09/10/2022.  No signs of recurrence of breast cancer.  Received Prolia injection for  osteoporosis.  She remains independent in her ADLs.  Reports good appetite.  She is sleeping well. Patient Active Problem List   Diagnosis Date Noted   Chronic systolic heart failure (HCC) 07/12/2022   Acquired thrombophilia (HCC) 07/12/2022   Nonrheumatic mitral (valve) insufficiency 05/12/2020   S/P mitral valve clip implantation 05/12/2020   Non-rheumatic mitral regurgitation 04/05/2020   Chronic idiopathic constipation 12/05/2017   Asymptomatic microscopic hematuria 12/05/2017   Unintentional weight loss 12/05/2017   Chronic kidney disease, stage IV (severe) (HCC) 12/05/2017   Anemia associated with chronic renal failure 12/05/2017   Hypercalcemia 12/05/2017   A-fib (HCC)    Pressure injury of skin 07/28/2017   Osteoporosis 10/20/2014   Malignant neoplasm of lower-outer quadrant of left breast of female, estrogen receptor positive (HCC) 10/11/2014   Pure hypercholesterolemia 04/08/2014   Anxiety    PUD (peptic ulcer disease)    HTN (hypertension), benign 06/29/2011   GERD (gastroesophageal reflux disease) 06/29/2011     Current Outpatient Medications on File Prior to Visit  Medication Sig Dispense Refill   acetaminophen (TYLENOL) 500 MG tablet Take 500-1,000 mg by mouth every 6 (six) hours as needed (for pain.).     amoxicillin (AMOXIL) 500 MG tablet Take 4 tablets (2,000 mg total) 1 hour prior to all dental visits. 8 tablet 11   apixaban (ELIQUIS) 2.5 MG TABS tablet Take 1 tablet (2.5 mg total) by mouth 2 (two) times daily. NEEDS FOLLOW UP APPOINTMENT FOR ANYMORE REFILLS 180 tablet 0   atorvastatin (LIPITOR) 80 MG  tablet TAKE 1 TABLET BY MOUTH ONCE DAILY. APPOINTMENT REQUIRED FOR FUTURE REFILLS 60 tablet 0   B-12, Methylcobalamin, 1000 MCG SUBL Take 1,000 mcg by mouth daily.     Cholecalciferol (VITAMIN D) 125 MCG (5000 UT) CAPS Take by mouth in the morning and at bedtime.     Co-Enzyme Q10 100 MG CAPS Take by mouth 2 (two) times daily.     DHA-EPA-Vitamin E (OMEGA-3 COMPLEX  PO) Take 1 tablet by mouth 2 (two) times daily.     ferrous sulfate 325 (65 FE) MG tablet Take 325 mg by mouth daily.     furosemide (LASIX) 40 MG tablet Take 40 mg by mouth as needed.     losartan (COZAAR) 25 MG tablet TAKE 1 TABLET BY MOUTH IN THE MORNING AND AT BEDTIME 180 tablet 4   Magnesium 200 MG CHEW Chew 400 mg by mouth daily.     Multiple Vitamin (MULTIVITAMIN WITH MINERALS) TABS tablet Take 1 tablet by mouth daily.     nitroGLYCERIN (NITROSTAT) 0.3 MG SL tablet DISSOLVE ONE TABLET UNDER THE TONGUE EVERY 5 MINUTES AS NEEDED FOR CHEST PAIN.  DO NOT EXCEED A TOTAL OF 3 DOSES IN 15 MINUTES NOW 100 tablet 0   spironolactone (ALDACTONE) 25 MG tablet Take 1 tablet by mouth once daily 90 tablet 0   No current facility-administered medications on file prior to visit.    Allergies  Allergen Reactions   Lisinopril Cough    Social History   Socioeconomic History   Marital status: Widowed    Spouse name: Not on file   Number of children: Not on file   Years of education: Not on file   Highest education level: Some college, no degree  Occupational History   Occupation: retired  Tobacco Use   Smoking status: Never   Smokeless tobacco: Never  Vaping Use   Vaping Use: Never used  Substance and Sexual Activity   Alcohol use: No   Drug use: No   Sexual activity: Not on file  Other Topics Concern   Not on file  Social History Narrative   Not on file   Social Determinants of Health   Financial Resource Strain: Patient Declined (11/11/2022)   Overall Financial Resource Strain (CARDIA)    Difficulty of Paying Living Expenses: Patient declined  Food Insecurity: Patient Declined (11/11/2022)   Hunger Vital Sign    Worried About Running Out of Food in the Last Year: Patient declined    Ran Out of Food in the Last Year: Patient declined  Transportation Needs: No Transportation Needs (11/11/2022)   PRAPARE - Administrator, Civil Service (Medical): No    Lack of  Transportation (Non-Medical): No  Physical Activity: Inactive (11/11/2022)   Exercise Vital Sign    Days of Exercise per Week: 0 days    Minutes of Exercise per Session: 20 min  Stress: No Stress Concern Present (11/11/2022)   [employer] of Occupational Health - Occupational Stress Questionnaire    Feeling of Stress : Not at all  Social Connections: Moderately Integrated (11/11/2022)   Social Connection and Isolation Panel [NHANES]    Frequency of Communication with Friends and Family: More than three times a week    Frequency of Social Gatherings with Friends and Family: Once a week    Attends Religious Services: More than 4 times per year    Active Member of [employer] or Organizations: Yes    Attends Banker Meetings: 1 to 4 times  per year    Marital Status: Widowed  Intimate Partner Violence: Not At Risk (08/10/2022)   Humiliation, Afraid, Rape, and Kick questionnaire    Fear of Current or Ex-Partner: No    Emotionally Abused: No    Physically Abused: No    Sexually Abused: No    Family History  Problem Relation Age of Onset   Pancreatic cancer Mother        pancreastic   Stroke Father    Hypertension Brother    Hypertension Sister    Hypertension Brother    Heart disease Brother    Hypertension Sister     Past Surgical History:  Procedure Laterality Date   BREAST LUMPECTOMY Left 2016   BREAST LUMPECTOMY WITH SENTINEL LYMPH NODE BIOPSY Left 10/22/2014   Procedure: BREAST LUMPECTOMY WITH SENTINEL LYMPH NODE BIOPSY;  Surgeon: Chevis Pretty III, MD;  Location: [facility];  Service: General;  Laterality: Left;   CARDIAC CATHETERIZATION     CARDIOVERSION N/A 06/13/2018   Procedure: CARDIOVERSION;  Surgeon: Dolores Patty, MD;  Location: [facility] ENDOSCOPY;  Service: Cardiovascular;  Laterality: N/A;   CORONARY STENT INTERVENTION N/A 07/27/2017   Procedure: CORONARY STENT INTERVENTION;  Surgeon: Swaziland, Peter M, MD;  Location: [facility] INVASIVE CV LAB;  Service:  Cardiovascular;  Laterality: N/A;   CORONARY/GRAFT ACUTE MI REVASCULARIZATION N/A 07/27/2017   Procedure: Coronary/Graft Acute MI Revascularization;  Surgeon: Swaziland, Peter M, MD;  Location: [facility] INVASIVE CV LAB;  Service: Cardiovascular;  Laterality: N/A;   LEFT HEART CATH AND CORONARY ANGIOGRAPHY N/A 07/27/2017   Procedure: LEFT HEART CATH AND CORONARY ANGIOGRAPHY;  Surgeon: Swaziland, Peter M, MD;  Location: [facility] INVASIVE CV LAB;  Service: Cardiovascular;  Laterality: N/A;   MITRAL VALVE REPAIR N/A 05/12/2020   Procedure: MITRAL VALVE REPAIR;  Surgeon: Tonny Bollman, MD;  Location: [facility] INVASIVE CV LAB;  Service: Cardiovascular;  Laterality: N/A;   RE-EXCISION OF BREAST CANCER,SUPERIOR MARGINS Left 11/04/2014   Procedure: RE-EXCISION OF LEFT BREAST INFERIOR MARGINS;  Surgeon: Chevis Pretty III, MD;  Location: [facility] OR;  Service: General;  Laterality: Left;   RIGHT HEART CATH N/A 04/05/2020   Procedure: RIGHT HEART CATH;  Surgeon: Dolores Patty, MD;  Location: [facility] INVASIVE CV LAB;  Service: Cardiovascular;  Laterality: N/A;   TEE WITHOUT CARDIOVERSION N/A 04/05/2020   Procedure: TRANSESOPHAGEAL ECHOCARDIOGRAM (TEE);  Surgeon: Dolores Patty, MD;  Location: [facility] ENDOSCOPY;  Service: Cardiovascular;  Laterality: N/A;   TEE WITHOUT CARDIOVERSION N/A 05/12/2020   Procedure: TRANSESOPHAGEAL ECHOCARDIOGRAM (TEE);  Surgeon: Tonny Bollman, MD;  Location: [facility] INVASIVE CV LAB;  Service: Cardiovascular;  Laterality: N/A;    ROS: Review of Systems Negative except as stated above  PHYSICAL EXAM: BP 129/76 (BP Location: Right Arm, Patient Position: Sitting, Cuff Size: Small)   Pulse 66   Temp 97.8 F (36.6 C) (Oral)   Ht 5\' 8"  (1.727 m)   Wt 153 lb (69.4 kg)   LMP  (LMP Unknown)   SpO2 100%   BMI 23.26 kg/m   Physical Exam  General appearance - alert, well appearing, pleasant elderly African-American female and in no distress Mental status - normal mood, behavior, speech, dress, motor activity, and thought  processes Neck - supple, no significant adenopathy Chest - clear to auscultation, no wheezes, rales or rhonchi, symmetric air entry Heart - normal rate, regular rhythm, normal S1, S2, no murmurs, rubs, clicks or gallops Extremities - peripheral pulses normal, no pedal edema, no clubbing or cyanosis  Latest Ref Rng & Units 09/10/2022   12:42 PM 07/12/2022   10:15 AM 03/09/2022   12:57 PM  CMP  Glucose 70 - 99 mg/dL 161  86  096   BUN 8 - 23 mg/dL 35  27  34   Creatinine 0.44 - 1.00 mg/dL 0.45  4.09  8.11   Sodium 135 - 145 mmol/L 141  143  139   Potassium 3.5 - 5.1 mmol/L 4.6  5.0  4.8   Chloride 98 - 111 mmol/L 110  107  107   CO2 22 - 32 mmol/L 25  22  28    Calcium 8.9 - 10.3 mg/dL 91.4  78.2  95.6   Total Protein 6.5 - 8.1 g/dL 7.0   7.5   Total Bilirubin 0.3 - 1.2 mg/dL 0.7   0.9   Alkaline Phos 38 - 126 U/L 34   43   AST 15 - 41 U/L 19   21   ALT 0 - 44 U/L 16   17    Lipid Panel     Component Value Date/Time   CHOL 130 11/13/2021 1115   CHOL 156 01/10/2018 1000   TRIG 25 11/13/2021 1115   HDL 69 11/13/2021 1115   HDL 74 01/10/2018 1000   CHOLHDL 1.9 11/13/2021 1115   VLDL 5 11/13/2021 1115   LDLCALC 56 11/13/2021 1115   LDLCALC 70 01/10/2018 1000    CBC    Component Value Date/Time   WBC 4.4 09/10/2022 1242   WBC 3.5 (L) 11/13/2021 1115   RBC 3.20 (L) 09/10/2022 1242   HGB 10.0 (L) 09/10/2022 1242   HGB 10.6 (L) 07/21/2019 1019   HGB 11.4 (L) 05/21/2017 0959   HCT 31.6 (L) 09/10/2022 1242   HCT 32.9 (L) 07/21/2019 1019   HCT 35.7 05/21/2017 0959   PLT 200 09/10/2022 1242   PLT 164 07/21/2019 1019   MCV 98.8 09/10/2022 1242   MCV 94 07/21/2019 1019   MCV 93.7 05/21/2017 0959   MCH 31.3 09/10/2022 1242   MCHC 31.6 09/10/2022 1242   RDW 12.7 09/10/2022 1242   RDW 13.0 07/21/2019 1019   RDW 12.4 05/21/2017 0959   LYMPHSABS 0.6 (L) 09/10/2022 1242   LYMPHSABS 1.1 12/03/2017 1519   LYMPHSABS 1.0 05/21/2017 0959   MONOABS 0.4 09/10/2022 1242   MONOABS  0.4 05/21/2017 0959   EOSABS 0.1 09/10/2022 1242   EOSABS 0.1 12/03/2017 1519   BASOSABS 0.0 09/10/2022 1242   BASOSABS 0.0 12/03/2017 1519   BASOSABS 0.0 05/21/2017 0959    ASSESSMENT AND PLAN: 1. Essential hypertension At goal.  Continue Cozaar 25 mg daily, spironolactone 25 mg daily  2. Chronic systolic heart failure (HCC) Stable and compensated.  Continue Cozaar 25 mg daily, spironolactone 25 mg daily and furosemide as needed  3. Primary hyperparathyroidism (HCC) I will have my CMA call Dr. Willeen Cass office to get her note.  Patient is not interested in any surgical options.  4. Paroxysmal atrial fibrillation (HCC) Stable.  She is on Eliquis.  5. Chronic kidney disease, stage IV (severe) (HCC) Stable.  Followed by nephrology.  Not on any NSAIDs.  6. Anemia due to stage 4 chronic kidney disease (HCC) Stable.  Patient is on iron supplement  7. Coronary artery disease involving native coronary artery of native heart without angina pectoris Intolerant of carvedilol. Continue atorvastatin.  We will check lipid profile today. - Lipid panel  8. Other osteoporosis without current pathological fracture Lloyd Huger through her oncologist.  Has  bone density study scheduled for next month.  There are no diagnoses linked to this encounter.  Patient was given the opportunity to ask questions.  Patient verbalized understanding of the plan and was able to repeat key elements of the plan.   This documentation was completed using Paediatric nurse.  Any transcriptional errors are unintentional.  No orders of the defined types were placed in this encounter.    Requested Prescriptions    No prescriptions requested or ordered in this encounter    No follow-ups on file.  Jonah Blue, MD, FACP

## 2022-11-12 NOTE — Progress Notes (Signed)
129/76  

## 2022-11-13 ENCOUNTER — Other Ambulatory Visit (HOSPITAL_COMMUNITY): Payer: Self-pay | Admitting: Internal Medicine

## 2022-11-13 LAB — LIPID PANEL
Chol/HDL Ratio: 2 ratio (ref 0.0–4.4)
Cholesterol, Total: 154 mg/dL (ref 100–199)
HDL: 78 mg/dL (ref >39)
LDL Chol Calc (NIH): 66 mg/dL (ref 0–99)
Triglycerides: 46 mg/dL (ref 0–149)
VLDL Cholesterol Cal: 10 mg/dL (ref 5–40)

## 2022-11-14 ENCOUNTER — Other Ambulatory Visit (HOSPITAL_COMMUNITY): Payer: Self-pay | Admitting: Internal Medicine

## 2022-12-05 ENCOUNTER — Ambulatory Visit: Payer: Self-pay

## 2022-12-05 NOTE — Patient Outreach (Signed)
  Care Coordination   Initial Visit Note   12/05/2022 Name: [name] MRN: [id number] DOB: 07/02/1934  [name] is a 88 y.o. year old female who sees Mary Matar, MD for primary care. I spoke with  [name] by phone today.  What matters to the patients health and wellness today?  The patient is interested in speaking with RN Care Manager regarding chronic kidney disease.    Goals Addressed             This Visit's Progress    COMPLETED: Care Coordination Activities       Care Coordination Interventions: SDoH screening performed - no resource challenges identified at this time Determined the patient does not have concerns with medication costs and/or adherence Educated the patient on the role of the care coordination team - future appointment scheduled with RN Care Manager Advised the patient to contact her primary care providers office as needed Collaboration with RN Care Manager [name] to advise of patients enrollment into care coordination services         SDOH assessments and interventions completed:  Yes  SDOH Interventions Today    Flowsheet Row Most Recent Value  SDOH Interventions   Food Insecurity Interventions Intervention Not Indicated  Housing Interventions Intervention Not Indicated  Transportation Interventions Intervention Not Indicated  Utilities Interventions Intervention Not Indicated        Care Coordination Interventions:  Yes, provided   Interventions Today    Flowsheet Row Most Recent Value  Chronic Disease   Chronic disease during today's visit Chronic Kidney Disease/End Stage Renal Disease (ESRD)  General Interventions   General Interventions Discussed/Reviewed General Interventions Discussed, Doctor Visits, Communication with, Referral to Nurse  Doctor Visits Discussed/Reviewed Doctor Visits Reviewed  Communication with RN  Education Interventions   Education Provided Provided Education  [role of the care  coordination team]        Follow up plan: Follow up call scheduled for 7/26 with RN Care Manager [name].    Encounter Outcome:  Pt. Visit Completed   [name], BSW, CDP Social Worker, Certified Dementia Practitioner [facility] Care Management  Care Coordination [phone]

## 2022-12-05 NOTE — Patient Instructions (Signed)
Visit Information  Thank you for taking time to visit with me today. Please don't hesitate to contact me if I can be of assistance to you.   Following are the goals we discussed today:   - Engage with RN Care Manager during upcomming visit  - Contact your primary care provider as needed  Your next appointment is by telephone on 7/26 at 1:00 pm with RN Care Manager [name]  Please call the care guide team at [phone] if you need to cancel or reschedule your appointment.   If you are experiencing a Mental Health or Behavioral Health Crisis or need someone to talk to, please go to [facility] [address] [place] [phone]) call [phone]  Patient verbalizes understanding of instructions and care plan provided today and agrees to view in MyChart. Active MyChart status and patient understanding of how to access instructions and care plan via MyChart confirmed with patient.     No further follow up required: Please contact me as needed.  Bevelyn Ngo, BSW, CDP Social Worker, Certified Dementia Practitioner [facility] Care Management  Care Coordination [phone]

## 2022-12-14 ENCOUNTER — Ambulatory Visit
Admission: RE | Admit: 2022-12-14 | Discharge: 2022-12-14 | Disposition: A | Discharge status: Home / Self Care | Payer: Medicare Other | Source: Ambulatory Visit | Attending: Hematology and Oncology | Admitting: Hematology and Oncology

## 2022-12-14 DIAGNOSIS — M8588 Other specified disorders of bone density and structure, other site: Secondary | ICD-10-CM | POA: Diagnosis not present

## 2022-12-14 DIAGNOSIS — Z853 Personal history of malignant neoplasm of breast: Secondary | ICD-10-CM | POA: Diagnosis not present

## 2022-12-14 DIAGNOSIS — N186 End stage renal disease: Secondary | ICD-10-CM | POA: Diagnosis not present

## 2022-12-14 DIAGNOSIS — M818 Other osteoporosis without current pathological fracture: Secondary | ICD-10-CM

## 2022-12-14 DIAGNOSIS — E349 Endocrine disorder, unspecified: Secondary | ICD-10-CM | POA: Diagnosis not present

## 2022-12-18 ENCOUNTER — Telehealth: Payer: Self-pay | Admitting: Hematology and Oncology

## 2022-12-18 NOTE — Telephone Encounter (Signed)
Called patient to inform about upcoming appointment. Received no answer unable to leave a vm mailbox not set up

## 2022-12-19 ENCOUNTER — Ambulatory Visit: Payer: Medicare Other | Attending: Internal Medicine | Admitting: Pharmacist

## 2022-12-19 ENCOUNTER — Encounter: Payer: Self-pay | Admitting: Pharmacist

## 2022-12-19 ENCOUNTER — Other Ambulatory Visit: Payer: Self-pay | Admitting: Pharmacist

## 2022-12-19 DIAGNOSIS — Z79899 Other long term (current) drug therapy: Secondary | ICD-10-CM

## 2022-12-19 MED ORDER — ATORVASTATIN CALCIUM 40 MG PO TABS: 40.0000 mg | ORAL_TABLET | Freq: Every day | ORAL | 1 refills | Status: DC

## 2022-12-19 NOTE — Progress Notes (Signed)
12/19/2022 Name: [name] MRN: [id number] DOB: 02-18-1935  No chief complaint on file.   [name] is a 88 y.o. year old female who presented for a telephone visit.   They were referred to the pharmacist by a quality report for assistance in managing medication access. She is at risk for failing her MAC (adherence to statin) measure.    Subjective:  Care Team: Primary Care Provider: [name], MD ; Next Scheduled Visit: not currently scheduled  Medication Access/Adherence  Current Pharmacy:  [facility] [address] [place] - [address] [place] [address] Phone: [phone] Fax: [phone]  Patient reports affordability concerns with their medications: No  Patient reports access/transportation concerns to their pharmacy: No  Patient reports adherence concerns with their medications:  No  - of note, she has been taking 1/2 of the atorvastatin 80 mg tablet. I reviewed her last PCP visit notes from 11/12/2022 and this is mentioned there as well.  Her LDL was 66 at that visit. We can still achieve high intensity with the 40 mg tablet. She is interested in receiving the 40mg  tablet so she does not have to cut the tablet.  Objective:  Lab Results  Component Value Date   HGBA1C 6.1 (H) 05/09/2020    Lab Results  Component Value Date   CREATININE 1.47 (H) 09/10/2022   BUN 35 (H) 09/10/2022   NA 141 09/10/2022   K 4.6 09/10/2022   CL 110 09/10/2022   CO2 25 09/10/2022    Lab Results  Component Value Date   CHOL 154 11/12/2022   HDL 78 11/12/2022   LDLCALC 66 11/12/2022   TRIG 46 11/12/2022   CHOLHDL 2.0 11/12/2022    Medications Reviewed Today     Reviewed by Bevelyn Ngo (Social Worker) on 12/05/22 at 1513  Med List Status: <None>   Medication Order Taking? Sig Documenting Provider Last Dose Status Informant  acetaminophen (TYLENOL) 500 MG tablet [id number] No Take 500-1,000 mg by mouth  every 6 (six) hours as needed (for pain.). [provider] Taking Active Self  amoxicillin (AMOXIL) 500 MG tablet [id number] No Take 4 tablets (2,000 mg total) 1 hour prior to all dental visits. Janetta Hora, PA-C Taking Active   apixaban (ELIQUIS) 2.5 MG TABS tablet [id number] No Take 1 tablet (2.5 mg total) by mouth 2 (two) times daily. NEEDS FOLLOW UP APPOINTMENT FOR ANYMORE REFILLS Bensimhon, Bevelyn Buckles, MD Taking Active   atorvastatin (LIPITOR) 80 MG tablet [id number] No TAKE 1 TABLET BY MOUTH ONCE DAILY. APPOINTMENT REQUIRED FOR FUTURE REFILLS Bensimhon, Bevelyn Buckles, MD Taking Active   B-12, Methylcobalamin, 1000 MCG SUBL [id number] No Take 1,000 mcg by mouth daily. [provider] Taking Active Self  Cholecalciferol (VITAMIN D) 125 MCG (5000 UT) CAPS [id number] No Take by mouth in the morning and at bedtime. [provider] Taking Active   Co-Enzyme Q10 100 MG CAPS [id number] No Take by mouth 2 (two) times daily. [provider] Taking Active   DHA-EPA-Vitamin E (OMEGA-3 COMPLEX PO) [id number] No Take 1 tablet by mouth 2 (two) times daily. [provider] Taking Active   ferrous sulfate 325 (65 FE) MG tablet [id number] No Take 325 mg by mouth daily. [provider] Taking Active   furosemide (LASIX) 40 MG tablet [id number] No Take 40 mg by mouth as needed. [provider] Taking Active   losartan (COZAAR) 25 MG tablet [id number] No TAKE 1 TABLET BY  MOUTH IN THE MORNING AND AT BEDTIME Bensimhon, Bevelyn Buckles, MD Taking Active   Magnesium 200 MG CHEW [id number] No Chew 400 mg by mouth daily. [provider] Taking Active   Multiple Vitamin (MULTIVITAMIN WITH MINERALS) TABS tablet [id number] No Take 1 tablet by mouth daily. [provider] Taking Active Self  nitroGLYCERIN (NITROSTAT) 0.3 MG SL tablet [id number] No DISSOLVE ONE TABLET UNDER THE TONGUE EVERY 5 MINUTES AS NEEDED FOR CHEST PAIN.  DO NOT EXCEED A TOTAL OF 3 DOSES IN 15  MINUTES NOW Bensimhon, Bevelyn Buckles, MD Taking Active   spironolactone (ALDACTONE) 25 MG tablet [id number]  Take 1 tablet by mouth once daily Bensimhon, Bevelyn Buckles, MD  Active               Assessment/Plan:   Medication Management: - Currently strategy sufficient to maintain appropriate adherence to prescribed medication regimen - Suggested use of weekly pill box to organize medications. -Resent rxn for atorvastatin 40mg  daily. Pt verbalizes understanding to stop the 80mg  tablet and proceed with 1 full tablet of 40mg  daily.     Follow Up Plan: with PCP.    Butch Penny, PharmD, Patsy Baltimore, CPP Clinical Pharmacist [facility] [phone]

## 2022-12-27 ENCOUNTER — Inpatient Hospital Stay: Payer: Medicare Other | Attending: Adult Health | Admitting: Adult Health

## 2022-12-27 ENCOUNTER — Telehealth: Payer: Self-pay | Admitting: Adult Health

## 2022-12-27 DIAGNOSIS — Z170 Estrogen receptor positive status [ER+]: Secondary | ICD-10-CM

## 2022-12-27 DIAGNOSIS — C50512 Malignant neoplasm of lower-outer quadrant of left female breast: Secondary | ICD-10-CM

## 2022-12-27 DIAGNOSIS — M816 Localized osteoporosis [Lequesne]: Secondary | ICD-10-CM

## 2022-12-27 NOTE — Telephone Encounter (Signed)
Called to see if patient would like to change 7/25 appointment to a phone visit. Will try reaching out again.

## 2022-12-27 NOTE — Progress Notes (Signed)
Called patient x 2 to review bone density results.  Unable to reach patient.  Recommend rescheduling and asked that she call us back to do so.    Lillard Anes, NP 12/27/22 12:49 PM Medical Oncology and Hematology [facility] [address] [place], [place] [address] Tel. [phone]    Fax. [phone]

## 2023-01-02 ENCOUNTER — Other Ambulatory Visit (HOSPITAL_COMMUNITY): Payer: Self-pay | Admitting: Internal Medicine

## 2023-01-08 ENCOUNTER — Ambulatory Visit: Payer: Self-pay

## 2023-01-08 NOTE — Patient Instructions (Signed)
Visit Information  Thank you for taking time to visit with me today. Please don't hesitate to contact me if I can be of assistance to you.   Following are the goals we discussed today:   Goals Addressed             This Visit's Progress    To have less fatigue       Care Coordination Interventions: Evaluation of current treatment plan related to fatigue  and patient's adherence to plan as established by provider Discussed with patient she feels fatigue most days and this makes it difficult for her to complete her daily tasks, although she does continue to be able to independently complete her daily routines Educated patient about the importance of balancing her activity with rest, and stay well hydrated with water, aiming for 48-64 ounces daily unless otherwise directed  Counseled on the importance of exercise goals with target of 150 minutes per week as tolerated Educated patient re: the PREP program and patient would like to participate Sent in basket message to PCP requesting PREP referral      To improve kidney function       Care Coordination Interventions: Assessed the Patient understanding of chronic kidney disease    Evaluation of current treatment plan related to chronic kidney disease self management and patient's adherence to plan as established by provider      Reviewed prescribed diet including daily water intake of 48-64 oz daily unless otherwise directed  Provided education on kidney disease progression    Last practice recorded BP readings:  BP Readings from Last 3 Encounters:  11/12/22 129/76  09/10/22 (!) 145/74  08/10/22 129/73   Most recent eGFR/CrCl:  Lab Results  Component Value Date   EGFR 29.0 10/09/2022    No components found for: "CRCL"          Our next appointment is by telephone on 01/22/23 at 09:30 AM  Please call the care guide team at [phone] if you need to cancel or reschedule your appointment.   If you are experiencing a Mental  Health or Behavioral Health Crisis or need someone to talk to, please call 1-800-273-TALK (toll free, 24 hour hotline)  Patient verbalizes understanding of instructions and care plan provided today and agrees to view in MyChart. Active MyChart status and patient understanding of how to access instructions and care plan via MyChart confirmed with patient.     Delsa Sale, RN, BSN, CCM Care Management Coordinator [facility] Care Management  Direct Phone: [phone]

## 2023-01-08 NOTE — Patient Outreach (Signed)
  Care Coordination   Initial Visit Note   01/08/2023 Name: [name] MRN: [id number] DOB: January 15, 1935  [name] is a 88 y.o. year old female who sees [name], MD for primary care. I spoke with  [name] by phone today.  What matters to the patients health and wellness today?  Patient would like to have less fatigue and improve her kidney function.     Goals Addressed             This Visit's Progress    To have less fatigue       Care Coordination Interventions: Evaluation of current treatment plan related to fatigue  and patient's adherence to plan as established by provider Discussed with patient she feels fatigue most days and this makes it difficult for her to complete her daily tasks, although she does continue to be able to independently complete her daily routines Educated patient about the importance of balancing her activity with rest, and stay well hydrated with water, aiming for 48-64 ounces daily unless otherwise directed  Counseled on the importance of exercise goals with target of 150 minutes per week as tolerated Educated patient re: the PREP program and patient would like to participate Sent in basket message to PCP requesting PREP referral      To improve kidney function       Care Coordination Interventions: Assessed the Patient understanding of chronic kidney disease    Evaluation of current treatment plan related to chronic kidney disease self management and patient's adherence to plan as established by provider      Reviewed prescribed diet including daily water intake of 48-64 oz daily unless otherwise directed  Provided education on kidney disease progression    Last practice recorded BP readings:  BP Readings from Last 3 Encounters:  11/12/22 129/76  09/10/22 (!) 145/74  08/10/22 129/73   Most recent eGFR/CrCl:  Lab Results  Component Value Date   EGFR 29.0 10/09/2022    No components found for: "CRCL"    Interventions  Today    Flowsheet Row Most Recent Value  Chronic Disease   Chronic disease during today's visit Chronic Kidney Disease/End Stage Renal Disease (ESRD), Other  [fatigue]  General Interventions   General Interventions Discussed/Reviewed General Interventions Reviewed, General Interventions Discussed, Doctor Visits, Labs, Communication with  Labs Kidney Function  Doctor Visits Discussed/Reviewed Doctor Visits Discussed, Doctor Visits Reviewed, PCP  PCP/Specialist Visits Contact provider for referral to  [facility], Dr. Jonah Blue, via in basket message re: PREP referral]  Contacted provider for referral to PCP  Exercise Interventions   Exercise Discussed/Reviewed Physical Activity, Exercise Reviewed, Exercise Discussed  Physical Activity Discussed/Reviewed Physical Activity Reviewed, Types of exercise, Physical Activity Discussed, PREP  Education Interventions   Education Provided Provided Education  Provided Verbal Education On When to see the doctor, Exercise, Labs, Nutrition  Labs Reviewed Kidney Function  Nutrition Interventions   Nutrition Discussed/Reviewed Fluid intake, Nutrition Discussed, Nutrition Reviewed  Pharmacy Interventions   Pharmacy Dicussed/Reviewed Pharmacy Topics Discussed, Pharmacy Topics Reviewed, Affording Medications          SDOH assessments and interventions completed:  Yes  SDOH Interventions Today    Flowsheet Row Most Recent Value  SDOH Interventions   Physical Activity Interventions PREP Program        Care Coordination Interventions:  Yes, provided   Follow up plan: Follow up call scheduled for 01/22/23 @09 :30 AM    Encounter Outcome:  Pt. Visit Completed

## 2023-01-09 ENCOUNTER — Telehealth: Payer: Self-pay | Admitting: Internal Medicine

## 2023-01-09 DIAGNOSIS — I5022 Chronic systolic (congestive) heart failure: Secondary | ICD-10-CM

## 2023-01-09 DIAGNOSIS — I2510 Atherosclerotic heart disease of native coronary artery without angina pectoris: Secondary | ICD-10-CM

## 2023-01-09 NOTE — Telephone Encounter (Signed)
-----   Message from Nurse Karma Lew sent at 01/08/2023 12:32 PM EDT ----- Regarding: referral needed Hello Dr. Laural Benes,   I am working with Ms. Meins for support with chronic disease management and care coordination. She would like to participate in the PREP program (provider referral exercise program). If you approve, can you please place the referral in EPIC? If you need instructions on doing so, please let me know and I can email you the instructions.   [place],  Delsa Sale, RN, BSN, CCM Care Management Coordinator [facility] Care Management Direct Phone: [phone]

## 2023-01-10 ENCOUNTER — Telehealth: Payer: Self-pay | Admitting: Adult Health

## 2023-01-10 NOTE — Telephone Encounter (Signed)
Patient was unable to come in person due to weather conditions; patient is aware that in person visit has been changed to My Chart Video visit

## 2023-01-11 ENCOUNTER — Inpatient Hospital Stay: Payer: Medicare Other | Attending: Adult Health | Admitting: Adult Health

## 2023-01-11 DIAGNOSIS — Z170 Estrogen receptor positive status [ER+]: Secondary | ICD-10-CM

## 2023-01-11 DIAGNOSIS — C50512 Malignant neoplasm of lower-outer quadrant of left female breast: Secondary | ICD-10-CM

## 2023-01-11 NOTE — Progress Notes (Signed)
I attempted to connect with [facility] on 3-4 different occasions today via video.  There was no sound.  I attempted and my nurse attempted on 2-3 occasions to call her on both her home and mobile #s and could not connect.  We will get her appointment rescheduled.    Lillard Anes, NP 01/11/23 4:45 PM Medical Oncology and Hematology [facility] [address] [place], [place] [address] Tel. [phone]    Fax. [phone]

## 2023-01-22 ENCOUNTER — Ambulatory Visit: Payer: Self-pay

## 2023-01-22 NOTE — Patient Outreach (Addendum)
  Care Coordination   Follow Up Visit Note   01/22/2023 Name: [name] MRN: [id number] DOB: 06-Dec-1934  [name] is a 88 y.o. year old female who sees [name], MD for primary care. I spoke with  [name] by phone today.  What matters to the patients health and wellness today?  Patient would like to participate in the PREP program.     Goals Addressed             This Visit's Progress    To have less fatigue       Care Coordination Interventions: Evaluation of current treatment plan related to fatigue  and patient's adherence to plan as established by provider Reviewed with patient a PREP referral was placed by Dr. Laural Benes on 01/09/23, educated patient on next steps with what to expect Sent email to the PREP team with notification a PREP referral has been placed and patient is awaiting a call to get signed up    Interventions Today    Flowsheet Row Most Recent Value  General Interventions   General Interventions Discussed/Reviewed General Interventions Discussed, General Interventions Reviewed, Communication with  Communication with RN  [PREP team]  Exercise Interventions   Exercise Discussed/Reviewed Physical Activity, Exercise Reviewed, Exercise Discussed  Physical Activity Discussed/Reviewed Physical Activity Reviewed, Physical Activity Discussed, PREP  Education Interventions   Provided Verbal Education On Exercise          SDOH assessments and interventions completed:  No     Care Coordination Interventions:  Yes, provided   Follow up plan: Follow up call scheduled for 03/05/23 @9 :30 AM    Encounter Outcome:  Pt. Visit Completed

## 2023-01-22 NOTE — Patient Instructions (Addendum)
Visit Information  Thank you for taking time to visit with me today. Please don't hesitate to contact me if I can be of assistance to you.   Following are the goals we discussed today:   Goals Addressed             This Visit's Progress    To have less fatigue       Care Coordination Interventions: Evaluation of current treatment plan related to fatigue  and patient's adherence to plan as established by provider Reviewed with patient a PREP referral was placed by Dr. Laural Benes on 01/09/23, educated patient on next steps with what to expect Sent email to the PREP team with notification a PREP referral has been placed and patient is awaiting a call to get signed up        Our next appointment is by telephone on 03/05/23 at 9:30 AM  Please call the care guide team at [phone] if you need to cancel or reschedule your appointment.   If you are experiencing a Mental Health or Behavioral Health Crisis or need someone to talk to, please call 1-800-273-TALK (toll free, 24 hour hotline)  Patient verbalizes understanding of instructions and care plan provided today and agrees to view in MyChart. Active MyChart status and patient understanding of how to access instructions and care plan via MyChart confirmed with patient.     Delsa Sale, RN, BSN, CCM Care Management Coordinator [facility] Care Management  Direct Phone: [phone]

## 2023-01-25 ENCOUNTER — Telehealth: Payer: Self-pay

## 2023-01-25 NOTE — Telephone Encounter (Signed)
Call to pt reference PREP referral.  Unable to leave a message VMF  Called cell and left message [phone]

## 2023-01-30 DIAGNOSIS — H2513 Age-related nuclear cataract, bilateral: Secondary | ICD-10-CM | POA: Diagnosis not present

## 2023-01-30 DIAGNOSIS — H401130 Primary open-angle glaucoma, bilateral, stage unspecified: Secondary | ICD-10-CM | POA: Diagnosis not present

## 2023-02-15 DIAGNOSIS — H2513 Age-related nuclear cataract, bilateral: Secondary | ICD-10-CM | POA: Diagnosis not present

## 2023-02-15 DIAGNOSIS — H401133 Primary open-angle glaucoma, bilateral, severe stage: Secondary | ICD-10-CM | POA: Diagnosis not present

## 2023-02-18 ENCOUNTER — Other Ambulatory Visit (HOSPITAL_COMMUNITY): Payer: Self-pay

## 2023-02-18 MED ORDER — APIXABAN 2.5 MG PO TABS: 2.5000 mg | ORAL_TABLET | Freq: Two times a day (BID) | ORAL | 0 refills | Status: DC

## 2023-02-18 MED ORDER — LOSARTAN POTASSIUM 25 MG PO TABS: 25.0000 mg | ORAL_TABLET | Freq: Every day | ORAL | 0 refills | Status: DC

## 2023-02-18 NOTE — Telephone Encounter (Signed)
Meds ordered this encounter  Medications   losartan (COZAAR) 25 MG tablet    Sig: Take 1 tablet (25 mg total) by mouth daily.    Dispense:  90 tablet    Refill:  0   apixaban (ELIQUIS) 2.5 MG TABS tablet    Sig: Take 1 tablet (2.5 mg total) by mouth 2 (two) times daily.    Dispense:  180 tablet    Refill:  0

## 2023-03-05 ENCOUNTER — Ambulatory Visit: Payer: Self-pay

## 2023-03-05 ENCOUNTER — Telehealth: Payer: Self-pay

## 2023-03-05 DIAGNOSIS — H401130 Primary open-angle glaucoma, bilateral, stage unspecified: Secondary | ICD-10-CM | POA: Diagnosis not present

## 2023-03-05 NOTE — Patient Instructions (Signed)
Visit Information  Thank you for taking time to visit with me today. Please don't hesitate to contact me if I can be of assistance to you.   Following are the goals we discussed today:   Goals Addressed             This Visit's Progress    To have less fatigue       Care Coordination Interventions: Evaluation of current treatment plan related to fatigue  and patient's adherence to plan as established by provider Reviewed and discussed with patient she missed a call from Viann Fish RN who oversee's the PREP program and was calling patient to get her signed up Provided patient with the contact number for Pam and discussed patient will call when ready to get signed up for the program        To reschedule lab and office visit with Dr. Talmage Nap       Care Coordination Interventions: Evaluation of current treatment plan related to parathyroid dysfunction and patient's adherence to plan as established by provider Reviewed and discussed with patient she showed up for a lab visit to Dr. Willeen Cass office, unfortunately the labs were not completed due to patient states the doctor did not place the order for the labs needed and the lab was unable to reach the doctor, patient did not keep the scheduled in person appointment due labs not being completed Reviewed chart and informed patient the labs needed per Dr. Talmage Nap have been ordered and patient should be able to complete her lab visit Provided patient with the contact number for Dr. Willeen Cass office in order to call and reschedule her lab and in person visits and patient verbalizes understanding           Our next appointment is by telephone on 04/16/23 at 10:30 AM  Please call the care guide team at [phone] if you need to cancel or reschedule your appointment.   If you are experiencing a Mental Health or Behavioral Health Crisis or need someone to talk to, please call 1-800-273-TALK (toll free, 24 hour hotline)  Patient verbalizes  understanding of instructions and care plan provided today and agrees to view in MyChart. Active MyChart status and patient understanding of how to access instructions and care plan via MyChart confirmed with patient.     Delsa Sale RN BSN CCM [place]  [facility], [facility] Health Nurse Care Coordinator  Direct Dial: [phone] Website: [email or web address]

## 2023-03-05 NOTE — Telephone Encounter (Signed)
Received vmf pt this am reference PREP class.  Did not receive left on cell number. Prefers using landline [phone]  Called pt on landline vm is full and unable to leave a message. Will retry

## 2023-03-05 NOTE — Patient Outreach (Signed)
Care Coordination   Follow Up Visit Note   03/05/2023 Name: [name] MRN: [id number] DOB: [birth year]  [name] is a 88 y.o. year old female who sees [name], MD for primary care. I spoke with  [name] by phone today.  What matters to the patients health and wellness today?  Patient would like to participate in the PREP program. She will reschedule her lab and in person visit with Endocrinology.     Goals Addressed             This Visit's Progress    To have less fatigue       Care Coordination Interventions: Evaluation of current treatment plan related to fatigue  and patient's adherence to plan as established by provider Reviewed and discussed with patient she missed a call from Viann Fish RN who oversee's the PREP program and was calling patient to get her signed up Provided patient with the contact number for Pam and discussed patient will call when ready to get signed up for the program      To reschedule lab and office visit with Dr. Talmage Nap       Care Coordination Interventions: Evaluation of current treatment plan related to parathyroid dysfunction and patient's adherence to plan as established by provider Reviewed and discussed with patient she showed up for a lab visit to Dr. Willeen Cass office, unfortunately the labs were not completed due to patient states the doctor did not place the order for the labs needed and the lab was unable to reach the doctor, patient did not keep the scheduled in person appointment due labs not being completed Reviewed chart and informed patient the labs needed per Dr. Talmage Nap have been ordered and patient should be able to complete her lab visit Provided patient with the contact number for Dr. Willeen Cass office in order to call and reschedule her lab and in person visits and patient verbalizes understanding     Interventions Today    Flowsheet Row Most Recent Value  Chronic Disease   Chronic disease during today's visit  Other  [thyroid disease,  glaucoma]  General Interventions   General Interventions Discussed/Reviewed General Interventions Discussed, General Interventions Reviewed, Doctor Visits, Labs  Doctor Visits Discussed/Reviewed Doctor Visits Discussed, Doctor Visits Reviewed, Specialist  Education Interventions   Education Provided Provided Education  Provided Verbal Education On Eye Care, Labs, When to see the doctor, Medication  Pharmacy Interventions   Pharmacy Dicussed/Reviewed Pharmacy Topics Reviewed, Pharmacy Topics Discussed, Medications and their functions          SDOH assessments and interventions completed:  No     Care Coordination Interventions:  Yes, provided   Follow up plan: Follow up call scheduled for 04/16/23 @10 :30 AM    Encounter Outcome:  Patient Visit Completed

## 2023-03-06 ENCOUNTER — Telehealth: Payer: Self-pay

## 2023-03-06 NOTE — Telephone Encounter (Signed)
Call from pt. Is interested in PREP. Explained program to pt. Can do 1p-215p MW starting on 03/25/23 Intake scheduled for 03/18/23 at 1pm at [facility]. Will meet her in the lobby.  Has my cell for contact.

## 2023-03-07 ENCOUNTER — Ambulatory Visit (HOSPITAL_COMMUNITY)
Admission: RE | Admit: 2023-03-07 | Discharge: 2023-03-07 | Disposition: A | Discharge status: Home / Self Care | Payer: Medicare Other | Source: Ambulatory Visit | Attending: Cardiology | Admitting: Cardiology

## 2023-03-07 ENCOUNTER — Encounter (HOSPITAL_COMMUNITY): Payer: Self-pay

## 2023-03-07 VITALS — BP 138/72 | HR 76 | Wt 155.4 lb

## 2023-03-07 DIAGNOSIS — I5022 Chronic systolic (congestive) heart failure: Secondary | ICD-10-CM | POA: Insufficient documentation

## 2023-03-07 DIAGNOSIS — I340 Nonrheumatic mitral (valve) insufficiency: Secondary | ICD-10-CM | POA: Diagnosis not present

## 2023-03-07 DIAGNOSIS — Z853 Personal history of malignant neoplasm of breast: Secondary | ICD-10-CM | POA: Insufficient documentation

## 2023-03-07 DIAGNOSIS — I130 Hypertensive heart and chronic kidney disease with heart failure and stage 1 through stage 4 chronic kidney disease, or unspecified chronic kidney disease: Secondary | ICD-10-CM | POA: Diagnosis not present

## 2023-03-07 DIAGNOSIS — Z923 Personal history of irradiation: Secondary | ICD-10-CM | POA: Insufficient documentation

## 2023-03-07 DIAGNOSIS — N184 Chronic kidney disease, stage 4 (severe): Secondary | ICD-10-CM | POA: Insufficient documentation

## 2023-03-07 DIAGNOSIS — Z79899 Other long term (current) drug therapy: Secondary | ICD-10-CM | POA: Insufficient documentation

## 2023-03-07 DIAGNOSIS — I2510 Atherosclerotic heart disease of native coronary artery without angina pectoris: Secondary | ICD-10-CM | POA: Insufficient documentation

## 2023-03-07 DIAGNOSIS — I252 Old myocardial infarction: Secondary | ICD-10-CM | POA: Diagnosis not present

## 2023-03-07 DIAGNOSIS — Z7901 Long term (current) use of anticoagulants: Secondary | ICD-10-CM | POA: Insufficient documentation

## 2023-03-07 DIAGNOSIS — I4821 Permanent atrial fibrillation: Secondary | ICD-10-CM | POA: Insufficient documentation

## 2023-03-07 LAB — COMPREHENSIVE METABOLIC PANEL
ALT: 23 U/L (ref 0–44)
AST: 25 U/L (ref 15–41)
Albumin: 3.7 g/dL (ref 3.5–5.0)
Alkaline Phosphatase: 36 U/L — ABNORMAL LOW (ref 38–126)
Anion gap: 7 (ref 5–15)
BUN: 38 mg/dL — ABNORMAL HIGH (ref 8–23)
CO2: 23 mmol/L (ref 22–32)
Calcium: 10.7 mg/dL — ABNORMAL HIGH (ref 8.9–10.3)
Chloride: 108 mmol/L (ref 98–111)
Creatinine, Ser: 1.69 mg/dL — ABNORMAL HIGH (ref 0.44–1.00)
GFR, Estimated: 29 mL/min — ABNORMAL LOW (ref >60)
Glucose, Bld: 102 mg/dL — ABNORMAL HIGH (ref 70–99)
Potassium: 4.4 mmol/L (ref 3.5–5.1)
Sodium: 138 mmol/L (ref 135–145)
Total Bilirubin: 1 mg/dL (ref 0.3–1.2)
Total Protein: 7.2 g/dL (ref 6.5–8.1)

## 2023-03-07 LAB — CBC
HCT: 33.3 % — ABNORMAL LOW (ref 36.0–46.0)
Hemoglobin: 10.8 g/dL — ABNORMAL LOW (ref 12.0–15.0)
MCH: 32 pg (ref 26.0–34.0)
MCHC: 32.4 g/dL (ref 30.0–36.0)
MCV: 98.8 fL (ref 80.0–100.0)
Platelets: 247 10*3/uL (ref 150–400)
RBC: 3.37 MIL/uL — ABNORMAL LOW (ref 3.87–5.11)
RDW: 12.5 % (ref 11.5–15.5)
WBC: 4.2 10*3/uL (ref 4.0–10.5)
nRBC: 0 % (ref 0.0–0.2)

## 2023-03-07 LAB — MAGNESIUM: Magnesium: 2.1 mg/dL (ref 1.7–2.4)

## 2023-03-07 NOTE — Progress Notes (Signed)
Advanced Heart Failure Clinic Note   Date:  03/07/2023   ID:  [name], DOB 1934/10/01, MRN [id number]  Location: Home  Provider location: [place] [facility] Type of Visit: Established patient  PCP:  [name], MD  Cardiologist:  None Primary HF: B[place]imhon  Chief Complaint: Heart Failure follow-up   History of Present Illness:  [name] is a 88 y.o. female  with h/o HTN, breast cancer, CAD s/p anterior STEMI 2/19, chronic AF and systolic HF.    Admitted to [facility] ED 07/27/17 with CP. Hospital course was complicated by acute respiratory failure, bradycardic arrest, Afib RVR, and cardiogenic shock in the setting of anterior MI.  Found to have acute anterior MI and taken urgently to the cath lab. LHC 07/27/17 as below with 3v CAD, LVEF 35-40%, elevated LVEDP. Underwent PCI with successful stenting of the ramus intermediate artery with DES x 2.. As she improved she was extubated on 2/24. An ECHO was completed an showed LVEF 40-45%.      On 06/13/18 she underwent cardioversion for recurrent A fib. She returned to the A fib clinic on 06/19/18 and was back in A Fib. Zio patch to assess AF rate = continuous AF with average rate 84. (was not on carvedilol at that time)   Echo 10/21 EF 40-45% severe MR. Mod TR. Mild AI.   Underwent MitraClip 05/12/20. Post op echo showed EF 45-50%, with moderate residual MR and mean gradient of 4.56mm Hg.   Echo 11/22: EF 40-45%, RV mildly reduced, RVSP 50 mmHg, moderate residual MR with mean gradient 5 mmHg.  Echo 8/23: EF 40%, RV normal, mod MR, mild-mod TR and severe asymmetric left ventricular hypertrophy of the septal segment.   Of note, she has a h/o hypercalcemia, hypophosphatemia and inappropriately elevated PTH 113: Concerning for primary hyperparathyroidism.  She has had hypercalcemia for a long time. Per PCP documentation, patient declined doing a scan and does not want to do any invasive or surgical procedures.    She presents today for routine f/u. Here w/ her son. Reports doing fairly well. Walked in clinic today w/o any assistance. Denies resting dyspnea. Able to go basic ADLs ok but feels that she is limited more by exertional fatigue than dyspnea. Denies CP.  No LEE or wt gain. Compliant w/ meds. BP 138/72. Had 1 episode in the last month when she felt dizzy w/ burled vision while sitting. Denies associated syncope/ near syncope. Had f/u w/ her opthalmologic was was told eye exam was benign. No recurrent symptoms since then. EKG today shows chronic atrial fibrillation 71 bpm and LVH     Cath results:   LHC 07/27/2017  Post Atrio lesion is 90% stenosed. Prox LAD to Dist LAD lesion is 50% stenosed. Ost 1st Diag lesion is 95% stenosed. Ost 2nd Diag to 2nd Diag lesion is 90% stenosed. Mid Cx lesion is 30% stenosed. Ost Ramus to Ramus lesion is 100% stenosed. A drug-eluting stent was successfully placed using a STENT SYNERGY DES 2.75X28. A drug-eluting stent was successfully placed using a STENT SYNERGY DES 2.5X12. Post intervention, there is a 0% residual stenosis. There is moderate to severe left ventricular systolic dysfunction. LV end diastolic pressure is moderately elevated. The left ventricular ejection fraction is 35-45% by visual estimate. 1. 3 vessel obstructive CAD    - 100% ramus intermediate artery. This is a large vessel and is the culprit lesion.    - 95% small first diagonal    -  90% small second diagonal     - diffusely diseased LAD    - 90% PLOM    Past Medical History:  Diagnosis Date   Anemia    Anxiety    Atrial fibrillation (HCC)    Breast cancer of lower-outer quadrant of left female breast (HCC) 10/11/2014   treated with lumpectomy and radiation   Chronic systolic CHF (congestive heart failure) (HCC)    CKD (chronic kidney disease)    Coronary artery disease 07/27/2017   GERD (gastroesophageal reflux disease)    Hypertension    Mitral regurgitation     Myocardial infarction (HCC) 07/27/2017   STEMI of anterolateral wall. Cath w/ stent and cabg by Dr. Swaziland   Osteoporosis 10/20/2014   Personal history of radiation therapy 2016   treatment of breast cancer   PONV (postoperative nausea and vomiting)    pt denies this.   PUD (peptic ulcer disease)    Pure hypercholesterolemia 04/08/2014   S/P mitral valve clip implantation 05/12/2020   s/p TEER with two NTW Mitraclip devices by Dr. Torrie Mayers, kidney    Past Surgical History:  Procedure Laterality Date   BREAST LUMPECTOMY Left 2016   BREAST LUMPECTOMY WITH SENTINEL LYMPH NODE BIOPSY Left 10/22/2014   Procedure: BREAST LUMPECTOMY WITH SENTINEL LYMPH NODE BIOPSY;  Surgeon: Chevis Pretty III, MD;  Location: [facility];  Service: General;  Laterality: Left;   CARDIAC CATHETERIZATION     CARDIOVERSION N/A 06/13/2018   Procedure: CARDIOVERSION;  Surgeon: Dolores Patty, MD;  Location: [facility] ENDOSCOPY;  Service: Cardiovascular;  Laterality: N/A;   CORONARY STENT INTERVENTION N/A 07/27/2017   Procedure: CORONARY STENT INTERVENTION;  Surgeon: Swaziland, Peter M, MD;  Location: [facility] INVASIVE CV LAB;  Service: Cardiovascular;  Laterality: N/A;   CORONARY/GRAFT ACUTE MI REVASCULARIZATION N/A 07/27/2017   Procedure: Coronary/Graft Acute MI Revascularization;  Surgeon: Swaziland, Peter M, MD;  Location: [facility] INVASIVE CV LAB;  Service: Cardiovascular;  Laterality: N/A;   LEFT HEART CATH AND CORONARY ANGIOGRAPHY N/A 07/27/2017   Procedure: LEFT HEART CATH AND CORONARY ANGIOGRAPHY;  Surgeon: Swaziland, Peter M, MD;  Location: [facility] INVASIVE CV LAB;  Service: Cardiovascular;  Laterality: N/A;   MITRAL VALVE REPAIR N/A 05/12/2020   Procedure: MITRAL VALVE REPAIR;  Surgeon: Tonny Bollman, MD;  Location: [facility] INVASIVE CV LAB;  Service: Cardiovascular;  Laterality: N/A;   RE-EXCISION OF BREAST CANCER,SUPERIOR MARGINS Left 11/04/2014   Procedure: RE-EXCISION OF LEFT BREAST INFERIOR MARGINS;  Surgeon: Chevis Pretty III, MD;   Location: [facility] OR;  Service: General;  Laterality: Left;   RIGHT HEART CATH N/A 04/05/2020   Procedure: RIGHT HEART CATH;  Surgeon: Dolores Patty, MD;  Location: [facility] INVASIVE CV LAB;  Service: Cardiovascular;  Laterality: N/A;   TEE WITHOUT CARDIOVERSION N/A 04/05/2020   Procedure: TRANSESOPHAGEAL ECHOCARDIOGRAM (TEE);  Surgeon: Dolores Patty, MD;  Location: [facility] ENDOSCOPY;  Service: Cardiovascular;  Laterality: N/A;   TEE WITHOUT CARDIOVERSION N/A 05/12/2020   Procedure: TRANSESOPHAGEAL ECHOCARDIOGRAM (TEE);  Surgeon: Tonny Bollman, MD;  Location: [facility] INVASIVE CV LAB;  Service: Cardiovascular;  Laterality: N/A;     Current Outpatient Medications  Medication Sig Dispense Refill   acetaminophen (TYLENOL) 500 MG tablet Take 500-1,000 mg by mouth every 6 (six) hours as needed (for pain.).     amoxicillin (AMOXIL) 500 MG tablet Take 4 tablets (2,000 mg total) 1 hour prior to all dental visits. 8 tablet 11   apixaban (ELIQUIS) 2.5 MG TABS tablet Take 1 tablet (2.5 mg  total) by mouth 2 (two) times daily. 180 tablet 0   atorvastatin (LIPITOR) 40 MG tablet Take 1 tablet (40 mg total) by mouth daily. 90 tablet 1   B-12, Methylcobalamin, 1000 MCG SUBL Take 1,000 mcg by mouth daily.     BLACK CURRANT SEED OIL PO Patient takes 1 teaspoon by mouth every night.     Cholecalciferol (VITAMIN D) 125 MCG (5000 UT) CAPS Take by mouth in the morning and at bedtime.     Co-Enzyme Q10 100 MG CAPS Take by mouth 2 (two) times daily.     DHA-EPA-Vitamin E (OMEGA-3 COMPLEX PO) Take 1 tablet by mouth 2 (two) times daily.     ferrous sulfate 325 (65 FE) MG tablet Take 325 mg by mouth daily.     furosemide (LASIX) 40 MG tablet Take 40 mg by mouth as needed.     losartan (COZAAR) 25 MG tablet Take 1 tablet (25 mg total) by mouth daily. (Patient taking differently: Take 25 mg by mouth 2 (two) times daily.) 90 tablet 0   Magnesium 200 MG CHEW Chew 400 mg by mouth daily.     Multiple Vitamin (MULTIVITAMIN WITH  MINERALS) TABS tablet Take 1 tablet by mouth daily.     nitroGLYCERIN (NITROSTAT) 0.3 MG SL tablet DISSOLVE ONE TABLET UNDER THE TONGUE EVERY 5 MINUTES AS NEEDED FOR CHEST PAIN.  DO NOT EXCEED A TOTAL OF 3 DOSES IN 15 MINUTES NOW 100 tablet 0   spironolactone (ALDACTONE) 25 MG tablet Take 1 tablet by mouth once daily 90 tablet 0   No current facility-administered medications for this encounter.    Allergies:   Lisinopril   Social History:  The patient  reports that she has never smoked. She has never used smokeless tobacco. She reports that she does not drink alcohol and does not use drugs.   Family History:  The patient's family history includes Heart disease in her brother; Hypertension in her brother, brother, sister, and sister; Pancreatic cancer in her mother; Stroke in her father.   ROS:  Please see the history of present illness.   All other systems are personally reviewed and negative.   Vitals:   03/07/23 1020  BP: 138/72  Pulse: 76  SpO2: 100%  Weight: 70.5 kg (155 lb 6.4 oz)   EKG: Atrial fibrillation (chronic) 71 bpm, LVH (personally reviewed)   PHYSICAL EXAM: General:  Well appearing. No respiratory difficulty HEENT: normal Neck: supple. no JVD. Carotids 2+ bilat; no bruits. No lymphadenopathy or thyromegaly appreciated. Cor: PMI nondisplaced. Irregularlly irregular rhythm and rate. No rubs, gallops or murmurs. Lungs: clear Abdomen: soft, nontender, nondistended. No hepatosplenomegaly. No bruits or masses. Good bowel sounds. Extremities: no cyanosis, clubbing, rash, edema Neuro: alert & oriented x 3, cranial nerves grossly intact. moves all 4 extremities w/o difficulty. Affect pleasant.   Recent Labs: 03/07/2023: ALT 23; BUN 38; Creatinine, Ser 1.69; Hemoglobin 10.8; Magnesium 2.1; Platelets 247; Potassium 4.4; Sodium 138  Personally reviewed   Wt Readings from Last 3 Encounters:  03/07/23 70.5 kg (155 lb 6.4 oz)  11/12/22 69.4 kg (153 lb)  09/10/22 70.7 kg (155  lb 12.8 oz)      ASSESSMENT AND PLAN:  1. CAD - 3V CAD with STEMI 07/2017 S/P DES x2 to Ramus - no anginal symptomology  - no ASA w/ Eliquis use  - Intolerant of carvedilol 3.125 bid  - on atorvastatin 40 mg, LDL good at 66 (6/24)    2. Chronic Systolic Heart Failure, ICM  Echo 12/2017 EF 45-50%  - Echo 9/20 EF ~40-45% 3+ MR - Echo 03/18/20 EF 40-45% severe MR - Underwent MitraClip 05/12/20. Post op echo showed EF 45-50%, with moderate residual MR and mean gradient of 4.46mm Hg. - Echo 11/22: EF 40-45%, RV mildly reduced, RVSP 50 mmHg, moderate residual MR with mean gradient 5 mmHg. - Echo 8/23: EF 40%, RV normal, mod MR w/ mG 5 mmHg, mild-mod TR + severe asymmetric left ventricular hypertrophy of the septal segment.  - Ischemic CM but ? Infiltrative process such as amyloid also given severe LVH on echo, chronic afib, CKD and chronic hypercalcemia. Per PCP documentation, patient has had hypercalcemia for a long time and declined doing a scan and does not want to do any invasive or surgical procedures. Discussed further cardiac testing today w/ patient and son. She is agreeable to check labs but will decide later on further imaging (cMRI and PYP). Depending on w/u may need gene testing  - Euvolemic on exam, NYHA Class II, confounded by age  - Takes Lasix 40 mg PRN  - Continue losartan 25 mg once daily  - Continue 25 mg spironolactone daily  - Failed carvedilol due to fatigue - SGLT2i previously deferred given GFR ~20  - Check BMP today   3. Permanent AF -S/P DC-CV 06/13/2018 to NSR but back in A fib on 1/16  - Remains in A fib. She was intolerant amio due to nausea and tremors.  - Dr Johney Frame had discussed PPM insertion, then uptitration of BB. She has deferred this procedure for now. No need for PPM currently.  - Zio 9/21 shows avg rate 84bpm. Intolerant of carvedilol 3.125 (failed 2x) - Previously followed in AF clinic.  - Continue eliquis 2.5 mg BID. denies bleeding. Check CBC today    4. Mitral regurgitation - ischemic in nature. 3+ MR on echo - s/p MitraClip 12/21 - most recent echo 8/23: mod MR w/ mG 5 mmHg - Aware of SBE prophylaxis  5. HTN - Blood pressure well controlled.  - Continue current regimen.   6. H.O Left Breast Cancer - No change to current plan.     7. CKD IV - Creatinine baseline 2.1-2.3 (GFR 21) - recheck labs today  - Follows with nephrology, [facility]  F/u in 2 months    Robbie Lis, PA-C  4:21 PM   Advanced Heart Failure Clinic [facility] [address] Heart and Vascular [place] [address] [phone] (office) [phone] (fax)

## 2023-03-07 NOTE — Patient Instructions (Signed)
Medication Changes:  No Changes In Medications at this time.   Lab Work:  Labs done today, your results will be available in MyChart, we will contact you for abnormal readings.  Follow-Up in: 3 months PLEASE CALL OUR OFFICE AROUND November 2024 TO GET SCHEDULED FOR YOUR APPOINTMENT. PHONE NUMBER IS [phone] OPTION 2   At the Advanced Heart Failure Clinic, you and your health needs are our priority. We have a designated team specialized in the treatment of Heart Failure. This Care Team includes your primary Heart Failure Specialized Cardiologist (physician), Advanced Practice Providers (APPs- Physician Assistants and Nurse Practitioners), and Pharmacist who all work together to provide you with the care you need, when you need it.   You may see any of the following providers on your designated Care Team at your next follow up:  Dr. Arvilla Meres Dr. Marca Ancona Dr. Dorthula Nettles Dr. Theresia Bough Tonye Becket, NP Robbie Lis, [place] [facility] [place], [place] Brynda Peon, NP [place] Lee, NP Karle Plumber, PharmD   Please be sure to bring in all your medications bottles to every appointment.   Need to Contact us:  If you have any questions or concerns before your next appointment please send [place] a message through [place] or call our office at [phone].    TO LEAVE A MESSAGE FOR THE NURSE SELECT OPTION 2, PLEASE LEAVE A MESSAGE INCLUDING: YOUR NAME DATE OF BIRTH CALL BACK NUMBER REASON FOR CALL**this is important as we prioritize the call backs  YOU WILL RECEIVE A CALL BACK THE SAME DAY AS LONG AS YOU CALL BEFORE 4:00 PM

## 2023-03-10 LAB — PROTEIN ELECTROPHORESIS, SERUM
A/G Ratio: 1.2 (ref 0.7–1.7)
Albumin ELP: 3.7 g/dL (ref 2.9–4.4)
Alpha-1-Globulin: 0.3 g/dL (ref 0.0–0.4)
Alpha-2-Globulin: 0.8 g/dL (ref 0.4–1.0)
Beta Globulin: 1 g/dL (ref 0.7–1.3)
Gamma Globulin: 0.9 g/dL (ref 0.4–1.8)
Globulin, Total: 3.1 g/dL (ref 2.2–3.9)
Total Protein ELP: 6.8 g/dL (ref 6.0–8.5)

## 2023-03-10 LAB — MULTIPLE MYELOMA PANEL, SERUM
Albumin SerPl Elph-Mcnc: 3.9 g/dL (ref 2.9–4.4)
Albumin/Glob SerPl: 1.4 (ref 0.7–1.7)
Alpha 1: 0.3 g/dL (ref 0.0–0.4)
Alpha2 Glob SerPl Elph-Mcnc: 0.8 g/dL (ref 0.4–1.0)
B-Globulin SerPl Elph-Mcnc: 1 g/dL (ref 0.7–1.3)
Gamma Glob SerPl Elph-Mcnc: 0.9 g/dL (ref 0.4–1.8)
Globulin, Total: 3 g/dL (ref 2.2–3.9)
IgA: 130 mg/dL (ref 64–422)
IgG (Immunoglobin G), Serum: 1105 mg/dL (ref 586–1602)
IgM (Immunoglobulin M), Srm: 55 mg/dL (ref 26–217)
Total Protein ELP: 6.9 g/dL (ref 6.0–8.5)

## 2023-03-11 ENCOUNTER — Other Ambulatory Visit (HOSPITAL_COMMUNITY): Payer: Self-pay | Admitting: Pharmacist

## 2023-03-11 MED ORDER — LOSARTAN POTASSIUM 25 MG PO TABS: 25.0000 mg | ORAL_TABLET | Freq: Two times a day (BID) | ORAL | 3 refills | Status: DC

## 2023-03-11 NOTE — Progress Notes (Signed)
Received message from patient. She was concerned because she thought she was supposed to be taking losartan 25 mg BID, but most recent prescription from the pharmacy was for losartan 25 mg daily. She was wondering what dose is correct.   After looking through her chart, it appears the losartan was for 25 mg BID, and when the prescription was resent to the pharmacy via an automatic refill request, it was incorrectly changed to 25 mg daily. I cannot see any notes from providers where the dose was changed to 25 mg daily. It appears to be a mistake. Patient has been stable on the losartan 25 mg BID dose. Will resend updated prescription to her pharmacy. Patient expressed understanding.   Karle Plumber, PharmD, BCPS, BCCP, CPP Heart Failure Clinic Pharmacist [phone]

## 2023-03-12 ENCOUNTER — Other Ambulatory Visit: Payer: Self-pay | Admitting: Hematology and Oncology

## 2023-03-12 DIAGNOSIS — C50512 Malignant neoplasm of lower-outer quadrant of left female breast: Secondary | ICD-10-CM

## 2023-03-13 ENCOUNTER — Inpatient Hospital Stay: Payer: Medicare Other

## 2023-03-13 ENCOUNTER — Inpatient Hospital Stay: Payer: Medicare Other | Attending: Adult Health

## 2023-03-13 VITALS — BP 153/76 | HR 70 | Temp 97.8°F | Resp 16

## 2023-03-13 DIAGNOSIS — Z853 Personal history of malignant neoplasm of breast: Secondary | ICD-10-CM | POA: Insufficient documentation

## 2023-03-13 DIAGNOSIS — M818 Other osteoporosis without current pathological fracture: Secondary | ICD-10-CM

## 2023-03-13 DIAGNOSIS — M810 Age-related osteoporosis without current pathological fracture: Secondary | ICD-10-CM | POA: Diagnosis not present

## 2023-03-13 DIAGNOSIS — C50512 Malignant neoplasm of lower-outer quadrant of left female breast: Secondary | ICD-10-CM

## 2023-03-13 LAB — CMP (CANCER CENTER ONLY)
ALT: 20 U/L (ref 0–44)
AST: 23 U/L (ref 15–41)
Albumin: 4.4 g/dL (ref 3.5–5.0)
Alkaline Phosphatase: 37 U/L — ABNORMAL LOW (ref 38–126)
Anion gap: 5 (ref 5–15)
BUN: 37 mg/dL — ABNORMAL HIGH (ref 8–23)
CO2: 26 mmol/L (ref 22–32)
Calcium: 11.7 mg/dL — ABNORMAL HIGH (ref 8.9–10.3)
Chloride: 110 mmol/L (ref 98–111)
Creatinine: 1.63 mg/dL — ABNORMAL HIGH (ref 0.44–1.00)
GFR, Estimated: 31 mL/min — ABNORMAL LOW (ref >60)
Glucose, Bld: 115 mg/dL — ABNORMAL HIGH (ref 70–99)
Potassium: 4.5 mmol/L (ref 3.5–5.1)
Sodium: 141 mmol/L (ref 135–145)
Total Bilirubin: 0.7 mg/dL (ref 0.3–1.2)
Total Protein: 7.5 g/dL (ref 6.5–8.1)

## 2023-03-13 LAB — CBC WITH DIFFERENTIAL (CANCER CENTER ONLY)
Abs Immature Granulocytes: 0.01 10*3/uL (ref 0.00–0.07)
Basophils Absolute: 0.1 10*3/uL (ref 0.0–0.1)
Basophils Relative: 1 %
Eosinophils Absolute: 0.1 10*3/uL (ref 0.0–0.5)
Eosinophils Relative: 1 %
HCT: 32.8 % — ABNORMAL LOW (ref 36.0–46.0)
Hemoglobin: 10.4 g/dL — ABNORMAL LOW (ref 12.0–15.0)
Immature Granulocytes: 0 %
Lymphocytes Relative: 14 %
Lymphs Abs: 0.7 10*3/uL (ref 0.7–4.0)
MCH: 31.3 pg (ref 26.0–34.0)
MCHC: 31.7 g/dL (ref 30.0–36.0)
MCV: 98.8 fL (ref 80.0–100.0)
Monocytes Absolute: 0.4 10*3/uL (ref 0.1–1.0)
Monocytes Relative: 9 %
Neutro Abs: 3.4 10*3/uL (ref 1.7–7.7)
Neutrophils Relative %: 75 %
Platelet Count: 242 10*3/uL (ref 150–400)
RBC: 3.32 MIL/uL — ABNORMAL LOW (ref 3.87–5.11)
RDW: 12.9 % (ref 11.5–15.5)
WBC Count: 4.6 10*3/uL (ref 4.0–10.5)
nRBC: 0 % (ref 0.0–0.2)

## 2023-03-13 MED ORDER — DENOSUMAB 60 MG/ML ~~LOC~~ SOSY
60.0000 mg | PREFILLED_SYRINGE | Freq: Once | SUBCUTANEOUS | Status: AC
  Administered 2023-03-13: 60 mg via SUBCUTANEOUS
  Filled 2023-03-13: qty 1

## 2023-03-18 NOTE — Progress Notes (Signed)
[facility] PREP Evaluation  Patient Details  Name: [name] MRN: [id number] Date of Birth: 1935-05-07 Age: 88 y.o. PCP: [name], MD  Vitals:   03/18/23 1328  BP: 138/70  Pulse: 70  SpO2: 99%  Weight: 154 lb 6.4 oz (70 kg)     [facility] Eval - 03/18/23 1300       [facility] "PREP" Location   [facility] "PREP" Location [facility]      Referral    Referring Provider Johnson    Reason for referral Heart Failure;High Cholesterol;Hypertension;Inactivity    Program Start Date 03/25/23   MW 1p-215p x 12 wks     Measurement   Waist Circumference 35.5 inches    Hip Circumference 40 inches    Body fat --   not calculated     Information for Trainer   Goals feel better, more energy, be able to do housework without fatigue    Current Exercise none-has bike at home    Orthopedic Concerns none    Pertinent Medical History HTN, CHF, Afib, hx of stent, hx of breast cancer    Current Barriers not being able to get to class    Medications that affect exercise Medication causing dizziness/drowsiness      Timed Up and Go (TUGS)   Timed Up and Go Moderate risk 10-12 seconds   loses balance when rising from chair     Mobility and Daily Activities   I find it easy to walk up or down two or more flights of stairs. 1    I have no trouble taking out the trash. 4    I do housework such as vacuuming and dusting on my own without difficulty. 4    I can easily lift a gallon of milk (8lbs). 4    I can easily walk a mile. 2    I have no trouble reaching into high cupboards or reaching down to pick up something from the floor. 4    I do not have trouble doing out-door work such as Loss adjuster, chartered, raking leaves, or gardening. 1      Mobility and Daily Activities   I feel younger than my age. 2    I feel independent. 4    I feel energetic. 2    I live an active life.  4    I feel strong. 4    I feel healthy. 4    I feel active as other people my age. 4      How fit and strong are you.    Fit and Strong Total Score 44            Past Medical History:  Diagnosis Date   Anemia    Anxiety    Atrial fibrillation (HCC)    Breast cancer of lower-outer quadrant of left female breast (HCC) 10/11/2014   treated with lumpectomy and radiation   Chronic systolic CHF (congestive heart failure) (HCC)    CKD (chronic kidney disease)    Coronary artery disease 07/27/2017   GERD (gastroesophageal reflux disease)    Hypertension    Mitral regurgitation    Myocardial infarction (HCC) 07/27/2017   STEMI of anterolateral wall. Cath w/ stent and cabg by Dr. Sw[place]   Osteoporosis 10/20/2014   Personal history of radiation therapy 2016   treatment of breast cancer   PONV (postoperative nausea and vomiting)    pt denies this.   PUD (peptic ulcer disease)    Pure hypercholesterolemia  04/08/2014   S/P mitral valve clip implantation 05/12/2020   s/p TEER with two NTW Mitraclip devices by Dr. Torrie Mayers, kidney    Past Surgical History:  Procedure Laterality Date   BREAST LUMPECTOMY Left 2016   BREAST LUMPECTOMY WITH SENTINEL LYMPH NODE BIOPSY Left 10/22/2014   Procedure: BREAST LUMPECTOMY WITH SENTINEL LYMPH NODE BIOPSY;  Surgeon: Chevis Pretty III, MD;  Location: [facility];  Service: General;  Laterality: Left;   CARDIAC CATHETERIZATION     CARDIOVERSION N/A 06/13/2018   Procedure: CARDIOVERSION;  Surgeon: Dolores Patty, MD;  Location: [facility] ENDOSCOPY;  Service: Cardiovascular;  Laterality: N/A;   CORONARY STENT INTERVENTION N/A 07/27/2017   Procedure: CORONARY STENT INTERVENTION;  Surgeon: Swazil[place], Peter M, MD;  Location: [facility] INVASIVE CV LAB;  Service: Cardiovascular;  Laterality: N/A;   CORONARY/GRAFT ACUTE MI REVASCULARIZATION N/A 07/27/2017   Procedure: Coronary/Graft Acute MI Revascularization;  Surgeon: [place]il[place], Peter M, MD;  Location: [facility] INVASIVE CV LAB;  Service: Cardiovascular;  Laterality: N/A;   LEFT HEART CATH AND CORONARY ANGIOGRAPHY N/A 07/27/2017   Procedure:  LEFT HEART CATH AND CORONARY ANGIOGRAPHY;  Surgeon: [place]azil[place], Peter M, MD;  Location: [facility] INVASIVE CV LAB;  Service: Cardiovascular;  Laterality: N/A;   MITRAL VALVE REPAIR N/A 05/12/2020   Procedure: MITRAL VALVE REPAIR;  Surgeon: Tonny Bollman, MD;  Location: [facility] INVASIVE CV LAB;  Service: Cardiovascular;  Laterality: N/A;   RE-EXCISION OF BREAST CANCER,SUPERIOR MARGINS Left 11/04/2014   Procedure: RE-EXCISION OF LEFT BREAST INFERIOR MARGINS;  Surgeon: Chevis Pretty III, MD;  Location: [facility] OR;  Service: General;  Laterality: Left;   RIGHT HEART CATH N/A 04/05/2020   Procedure: RIGHT HEART CATH;  Surgeon: Dolores Patty, MD;  Location: [facility] INVASIVE CV LAB;  Service: Cardiovascular;  Laterality: N/A;   TEE WITHOUT CARDIOVERSION N/A 04/05/2020   Procedure: TRANSESOPHAGEAL ECHOCARDIOGRAM (TEE);  Surgeon: Dolores Patty, MD;  Location: [facility] ENDOSCOPY;  Service: Cardiovascular;  Laterality: N/A;   TEE WITHOUT CARDIOVERSION N/A 05/12/2020   Procedure: TRANSESOPHAGEAL ECHOCARDIOGRAM (TEE);  Surgeon: Tonny Bollman, MD;  Location: [facility] INVASIVE CV LAB;  Service: Cardiovascular;  Laterality: N/A;   Social History   Tobacco Use  Smoking Status Never  Smokeless Tobacco Never    Bonnye Fava 03/18/2023, 1:34 PM

## 2023-03-20 ENCOUNTER — Telehealth (HOSPITAL_COMMUNITY): Payer: Self-pay

## 2023-03-20 NOTE — Telephone Encounter (Signed)
Advanced Heart Failure Patient Advocate Encounter  Returned patient call regarding Eliquis. Discussed 3% criteria for BMS application. Patient is going to obtain an expense report from the pharmacy and contact me with the OOP total for this year so we can determine if application should be sent, or if samples may be needed.  Burnell Blanks, CPhT Rx Patient Advocate Phone: [phone]

## 2023-03-27 NOTE — Progress Notes (Signed)
[facility] PREP Weekly Session  Patient Details  Name: [name] MRN: [id number] Date of Birth: [birth year] Age: 88 y.o. PCP: [name], MD  There were no vitals filed for this visit.   [facility] Weekly seesion - 03/27/23 1500       [facility] "PREP" Location   [facility] "PREP" Location [facility]      Weekly Session   Topic Discussed Goal setting and welcome to the program   fit testing done   Classes attended to date 2             Bonnye Fava 03/27/2023, 3:34 PM

## 2023-04-01 NOTE — Progress Notes (Signed)
[facility] PREP Weekly Session  Patient Details  Name: [name] MRN: [id number] Date of Birth: [birth year] Age: 88 y.o. PCP: [name], MD  Vitals:   04/01/23 1452  Weight: 155 lb 9.6 oz (70.6 kg)     [facility] Weekly seesion - 04/01/23 1400       [facility] "PREP" Location   [facility] "PREP" Location [facility]      Weekly Session   Topic Discussed Other ways to be active;Importance of resistance training   Scale of perceived exertion   Classes attended to date 3             Bonnye Fava 04/01/2023, 2:55 PM

## 2023-04-05 DIAGNOSIS — H2513 Age-related nuclear cataract, bilateral: Secondary | ICD-10-CM | POA: Diagnosis not present

## 2023-04-05 DIAGNOSIS — H401133 Primary open-angle glaucoma, bilateral, severe stage: Secondary | ICD-10-CM | POA: Diagnosis not present

## 2023-04-15 NOTE — Progress Notes (Signed)
[facility] PREP Weekly Session  Patient Details  Name: [name] MRN: [id number] Date of Birth: April 13, 1935 Age: 88 y.o. PCP: [name], MD  Vitals:   04/15/23 1300  Weight: 157 lb (71.2 kg)     [facility] Weekly seesion - 04/15/23 1400       [facility] "PREP" Location   [facility] "PREP" [facility]      Weekly Session   Topic Discussed Healthy eating tips    Minutes exercised this week --   not reported   Classes attended to date 6             Bonnye Fava 04/15/2023, 2:45 PM

## 2023-04-16 ENCOUNTER — Ambulatory Visit: Payer: Self-pay

## 2023-04-16 NOTE — Patient Outreach (Signed)
  Care Coordination   Follow Up Visit Note   04/16/2023 Name: [name] MRN: [id number] DOB: 07-11-1934  [name] is a 88 y.o. year old female who sees [name], MD for primary care. I spoke with  [name] by phone today.  What matters to the patients health and wellness today?  Patient would like to self manage her CHF, she will adhere to daily weights and report concerns to her Cardiologist.     Goals Addressed             This Visit's Progress    To have less fatigue   On track    Care Coordination Interventions: Evaluation of current treatment plan related to fatigue  and patient's adherence to plan as established by provider Discussed with patient she is currently participating in the PREP program and finding it to be effective Positive reinforcement given to patient for making efforts to improve her overall health      To self-manage CHF       Care Coordination Interventions: Basic overview and discussion of pathophysiology of Heart Failure reviewed Reviewed Heart Failure Action Plan in depth and provided written copy Assessed need for readable accurate scales in home Provided education about placing scale on hard, flat surface Advised patient to weigh each morning after emptying bladder Discussed importance of daily weight and advised patient to weigh and record daily     Interventions Today    Flowsheet Row Most Recent Value  Chronic Disease   Chronic disease during today's visit Congestive Heart Failure (CHF), Other  [toe fungus]  General Interventions   General Interventions Discussed/Reviewed General Interventions Discussed, General Interventions Reviewed, Doctor Visits, Annual Foot Exam, Communication with  Doctor Visits Discussed/Reviewed Doctor Visits Discussed, Doctor Visits Reviewed, Specialist, PCP  Communication with PCP/Specialists  [Dr. Laural Benes  Exercise Interventions   Exercise Discussed/Reviewed Physical Activity, Exercise  Reviewed, Exercise Discussed  Physical Activity Discussed/Reviewed Physical Activity Discussed, Physical Activity Reviewed, PREP  Education Interventions   Education Provided Provided Education  Provided Verbal Education On When to see the doctor, Exercise  Pharmacy Interventions   Pharmacy Dicussed/Reviewed Pharmacy Topics Reviewed          SDOH assessments and interventions completed:  No     Care Coordination Interventions:  Yes, provided   Follow up plan: Follow up call scheduled for 04/30/23 @1 :30 PM    Encounter Outcome:  Patient Visit Completed

## 2023-04-16 NOTE — Patient Instructions (Signed)
Visit Information  Thank you for taking time to visit with me today. Please don't hesitate to contact me if I can be of assistance to you.   Following are the goals we discussed today:   Goals Addressed             This Visit's Progress    To have less fatigue   On track    Care Coordination Interventions: Evaluation of current treatment plan related to fatigue  and patient's adherence to plan as established by provider Discussed with patient she is currently participating in the PREP program and finding it to be effective Positive reinforcement given to patient for making efforts to improve her overall health      To self-manage CHF       Care Coordination Interventions: Basic overview and discussion of pathophysiology of Heart Failure reviewed Reviewed Heart Failure Action Plan in depth and provided written copy Assessed need for readable accurate scales in home Provided education about placing scale on hard, flat surface Advised patient to weigh each morning after emptying bladder Discussed importance of daily weight and advised patient to weigh and record daily        Our next appointment is by telephone on 04/30/23 at 1:30 PM  Please call the care guide team at [phone] if you need to cancel or reschedule your appointment.   If you are experiencing a Mental Health or Behavioral Health Crisis or need someone to talk to, please call 1-800-273-TALK (toll free, 24 hour hotline)  Patient verbalizes understanding of instructions and care plan provided today and agrees to view in MyChart. Active MyChart status and patient understanding of how to access instructions and care plan via MyChart confirmed with patient.     Delsa Sale RN BSN CCM [place]  [facility], [facility] Health Nurse Care Coordinator  Direct Dial: [phone] Website: [email or web address]

## 2023-04-16 NOTE — Patient Outreach (Signed)
  Care Coordination   04/16/2023 Name: [name] MRN: [id number] DOB: March 22, 1935   Care Coordination Outreach Attempts:  An unsuccessful telephone outreach was attempted for a scheduled appointment today.  Follow Up Plan:  Additional outreach attempts will be made to offer the patient care coordination information and services.   Encounter Outcome:  No Answer   Care Coordination Interventions:  No, not indicated    Delsa Sale RN BSN CCM [place]  [facility], [facility] Nurse Care Coordinator  Direct Dial: [phone] Website: [email or web address]

## 2023-04-22 NOTE — Progress Notes (Signed)
[employer] PREP Weekly Session  Patient Details  Name: [name] MRN: [id number] Date of Birth: Oct 29, 1934 Age: 88 y.o. PCP: [name], MD  Vitals:   04/22/23 1447  Weight: 155 lb 9.6 oz (70.6 kg)     [facility] Weekly seesion - 04/22/23 1400       [facility] "PREP" Location   [facility]" [facility]      Weekly Session   Topic Discussed Health habits    Minutes exercised this week 25 minutes    Classes attended to date 7             Bonnye Fava 04/22/2023, 2:48 PM

## 2023-04-29 DIAGNOSIS — H2513 Age-related nuclear cataract, bilateral: Secondary | ICD-10-CM | POA: Diagnosis not present

## 2023-04-29 DIAGNOSIS — H401133 Primary open-angle glaucoma, bilateral, severe stage: Secondary | ICD-10-CM | POA: Diagnosis not present

## 2023-04-30 ENCOUNTER — Ambulatory Visit: Payer: Self-pay

## 2023-04-30 NOTE — Patient Outreach (Signed)
  Care Coordination   04/30/2023 Name: [name] MRN: [id number] DOB: 12-Dec-1934   Care Coordination Outreach Attempts:  An unsuccessful telephone outreach was attempted for a scheduled appointment today.  Follow Up Plan:  Additional outreach attempts will be made to offer the patient care coordination information and services.   Encounter Outcome:  No Answer   Care Coordination Interventions:  No, not indicated    Delsa Sale RN BSN CCM [place]  [facility], [facility] Health Nurse Care Coordinator  Direct Dial: [phone] Website: [email or web address]

## 2023-05-09 ENCOUNTER — Ambulatory Visit: Payer: Medicare Other | Admitting: Podiatry

## 2023-05-17 ENCOUNTER — Other Ambulatory Visit (HOSPITAL_COMMUNITY): Payer: Self-pay | Admitting: Internal Medicine

## 2023-05-20 ENCOUNTER — Telehealth: Payer: Self-pay

## 2023-05-20 NOTE — Telephone Encounter (Signed)
Received a call from pt today in response to my text about returning to PREP Does want to continue with program. Will not be coming today due to weather.  Has attended 7 of 15 classes so far.

## 2023-05-23 ENCOUNTER — Ambulatory Visit: Payer: Medicare Other | Admitting: Podiatry

## 2023-05-23 ENCOUNTER — Encounter: Payer: Self-pay | Admitting: Podiatry

## 2023-05-23 DIAGNOSIS — B351 Tinea unguium: Secondary | ICD-10-CM | POA: Diagnosis not present

## 2023-05-23 NOTE — Progress Notes (Signed)
Subjective:  Patient ID: [name], female    DOB: 03/03/1935,  MRN: [id number]  [name] presents to clinic today for:  Chief Complaint  Patient presents with   Nail Problem    Patient states she has nail fungus on her left hallux  , this has been going on for a few years patient states . No medication for pain . No pain , patient will like to know what she should do . She says her right hallux toe nail came off and now her left hallux may fall off also.  Patient presents with above-stated complaint.  She presents with chronic fungal toenail infection affecting the left great toenail.  It has become thickened, elongated, dystrophic with discoloration and is tender with shoes.  She is unable to maintain herself.  She had similar appearance of the right hallux nail in the past, this fell off on its own and healthy nail grew in.  Denies diabetes.  PCP is [name], MD.  Allergies  Allergen Reactions   Lisinopril Cough    Review of Systems: Negative except as noted in the HPI.  Objective:  There were no vitals filed for this visit.  [name] is a pleasant 88 y.o. female in NAD. AAO x 3.  Vascular Examination: Capillary refill time is 3 seconds to toes bilateral. Palpable pedal pulses b/l LE. Digital hair diminished b/l. No pedal edema b/l. Skin temperature gradient WNL b/l. No varicosities b/l. No cyanosis or clubbing noted b/l.   Dermatological Examination: Onychomycosis present to the left hallux nail plate that was thickened, elongated, dystrophic with subungual debris and yellow discoloration.  Associated odor present suggestive of mycotic infection.  Tenderness with direct dorsal palpation.  Neurological Examination: Protective sensation intact with Semmes-Weinstein 10 gram monofilament b/l LE. Vibratory sensation intact b/l LE.      No data to display           Assessment/Plan: 1. Onychomycosis of left great toe     No orders of the  defined types were placed in this encounter.   Discussed patient's condition today.  Discussed treatment options including palliative debridement, medication management and removal of the nail plate.  Patient elected to proceed with removal of the nail plate.  After obtaining patient consent, the left hallux was anesthetized with a 50:50 mixture of 1% lidocaine plain and 0.5% bupivacaine plain for a total of 3cc's administered.  Upon confirmation of anesthesia, a freer elevator was utilized to free the left hallux nail plate from the nail bed.  The entirety of the nail plate was freed proximal to the eponychium and removed with a hemostat.  The area was inspected for any remaining spicules.  No chemical matrixectomy was performed today.  Antibiotic ointment and a DSD were applied, followed by a Coban dressing.  Patient tolerated the anesthetic and procedure well and will f/u in 2-3 weeks for recheck.  Patient given post-procedure instructions for daily 15-minute Epsom salt soaks, antibiotic ointment and daily use of Bandaids until toe starts to dry / form eschar.    Return in about 2 weeks (around 06/06/2023) for Nail Check.   Bronwen Betters, DPM, AACFAS [facility]     [address]  [place], [place] [address]                Office [phone]  Fax [phone]

## 2023-05-23 NOTE — Patient Instructions (Signed)

## 2023-05-31 ENCOUNTER — Other Ambulatory Visit (HOSPITAL_COMMUNITY): Payer: Self-pay | Admitting: Internal Medicine

## 2023-06-06 ENCOUNTER — Encounter: Payer: Self-pay | Admitting: Podiatry

## 2023-06-06 ENCOUNTER — Ambulatory Visit: Payer: Medicare Other | Admitting: Podiatry

## 2023-06-06 VITALS — Ht 68.0 in | Wt 155.6 lb

## 2023-06-06 DIAGNOSIS — B351 Tinea unguium: Secondary | ICD-10-CM | POA: Diagnosis not present

## 2023-06-06 NOTE — Progress Notes (Signed)
       Subjective:  Patient ID: [name], female    DOB: 07-16-1934,  MRN: [id number]  Chief Complaint  Patient presents with   Nail Problem    She reports the nail is doing well and no concerns today.     [name] presents to clinic today for f/u of total nail avulsion to the left hallux.  Doing well.  Denies any pain.  Compliant with aftercare instructions.  PCP is Vicci Barnie NOVAK, MD.  Allergies  Allergen Reactions   Lisinopril  Cough    Objective:  There were no vitals filed for this visit.  Vascular Examination: Capillary refill time is 3 seconds to toes bilateral. Palpable pedal pulses b/l LE. Digital hair present b/l. No pedal edema b/l. Skin temperature gradient WNL b/l. No varicosities b/l. No cyanosis or clubbing noted b/l.   Dermatological Examination: Upon inspection of the toenail avulsion site, there are no clinical signs of infection.  No purulence, no necrosis, no malodor present.  No erythema present.  Mild maceration to nail bed with eschar beginning to form. Minimal to no pain on palpation of area.   Assessment/Plan: 1. Onychomycosis of left great toe     No orders of the defined types were placed in this encounter.  Total nail avulsion site healing well - May continue soaking and applying Band-Aid until dry stable eschar forms No signs of infection at this point - Follow-up in about 2 weeks for routine footcare for other remaining nails which do show signs of onychomycosis.  Return in about 10 weeks (around 08/15/2023), or if symptoms worsen or fail to improve, for Routine Foot Care.   Ethan Saddler, DPM, AACFAS [facility]     [address] [place], [place] [address]                Office [phone]  Fax [phone]

## 2023-06-07 ENCOUNTER — Encounter: Payer: Self-pay | Admitting: Podiatry

## 2023-06-25 ENCOUNTER — Other Ambulatory Visit (HOSPITAL_COMMUNITY): Payer: Self-pay

## 2023-06-25 ENCOUNTER — Ambulatory Visit: Payer: Self-pay

## 2023-06-25 ENCOUNTER — Encounter: Payer: Self-pay | Admitting: Oncology

## 2023-06-25 DIAGNOSIS — I10 Essential (primary) hypertension: Secondary | ICD-10-CM

## 2023-06-25 DIAGNOSIS — I5022 Chronic systolic (congestive) heart failure: Secondary | ICD-10-CM

## 2023-06-25 NOTE — Patient Instructions (Signed)
Visit Information  Thank you for taking time to visit with me today. Please don't hesitate to contact me if I can be of assistance to you.   Following are the goals we discussed today:   Goals Addressed             This Visit's Progress    To continue self-monitoring blood pressures       Care Coordination Interventions: Evaluation of current treatment plan related to hypertension self management and patient's adherence to plan as established by provider Reviewed and discussed with patient her home BP readings, averaging 130/80's or less than Advised patient, providing education and rationale, to monitor blood pressure daily and record, calling PCP for findings outside established parameters Assessed social determinant of health barriers Last practice recorded BP readings:  BP Readings from Last 3 Encounters:  03/18/23 138/70  03/13/23 (!) 153/76  03/07/23 138/72   Most recent eGFR/CrCl:  Lab Results  Component Value Date   EGFR 29.0 10/09/2022    No components found for: "CRCL"     COMPLETED: To have less fatigue       Care Coordination Interventions: Evaluation of current treatment plan related to fatigue  and patient's adherence to plan as established by provider Discussed with patient she is currently participating in the PREP program and finding it to be effective Positive reinforcement given to patient for making efforts to improve her overall health      To self-manage CHF   On track    Care Coordination Interventions: Basic overview and discussion of pathophysiology of Heart Failure reviewed Reviewed Heart Failure Action Plan in depth and provided written copy Assessed need for readable accurate scales in home Provided education about placing scale on hard, flat surface Advised patient to weigh each morning after emptying bladder Discussed importance of daily weight and advised patient to weigh and record daily Patient will weigh herself daily and record her  weights Patient will follow the heart failure action plan and contact her Cardiologist to report symptoms or concerns Patient will contact [facility] to schedule a follow up visit  Patient will work with the pharmacist to discuss resources for cost of Eliquis         Our next appointment is by telephone on 08/23/23 at 2:30 PM  Please call the care guide team at [phone] if you need to cancel or reschedule your appointment.   If you are experiencing a Mental Health or Behavioral Health Crisis or need someone to talk to, please call 1-800-273-TALK (toll free, 24 hour hotline)  Patient verbalizes understanding of instructions and care plan provided today and agrees to view in MyChart. Active MyChart status and patient understanding of how to access instructions and care plan via MyChart confirmed with patient.     Delsa Sale RN BSN CCM   [facility], [facility] Health Nurse Care Coordinator  Direct Dial: [phone] Website: [email or web address]

## 2023-06-25 NOTE — Patient Outreach (Addendum)
Care Coordination   Follow Up Visit Note   06/25/2023 Name: [name] MRN: [id number] DOB: 1934-07-31  [name] is a 89 y.o. year old female who sees [name], MD for primary care. I spoke with  [name] by phone today.  What matters to the patients health and wellness today?  Patient will continue to monitor her BP and daily weights. She will call the heart clinic to schedule her follow up visit.     Goals Addressed             This Visit's Progress    To continue self-monitoring blood pressures       Care Coordination Interventions: Evaluation of current treatment plan related to hypertension self management and patient's adherence to plan as established by provider Reviewed and discussed with patient her home BP readings, averaging 130/80's or less than Advised patient, providing education and rationale, to monitor blood pressure daily and record, calling PCP for findings outside established parameters Assessed social determinant of health barriers Last practice recorded BP readings:  BP Readings from Last 3 Encounters:  03/18/23 138/70  03/13/23 (!) 153/76  03/07/23 138/72   Most recent eGFR/CrCl:  Lab Results  Component Value Date   EGFR 29.0 10/09/2022    No components found for: "CRCL"      COMPLETED: To have less fatigue       Care Coordination Interventions: Evaluation of current treatment plan related to fatigue  and patient's adherence to plan as established by provider Discussed with patient she is currently participating in the PREP program and finding it to be effective Positive reinforcement given to patient for making efforts to improve her overall health      To self-manage CHF   On track    Care Coordination Interventions: Basic overview and discussion of pathophysiology of Heart Failure reviewed Reviewed Heart Failure Action Plan in depth and provided written copy Assessed need for readable accurate scales in home Provided  education about placing scale on hard, flat surface Advised patient to weigh each morning after emptying bladder Discussed importance of daily weight and advised patient to weigh and record daily Patient will weigh herself daily and record her weights Patient will follow the heart failure action plan and contact her Cardiologist to report symptoms or concerns Patient will contact [facility] to schedule a follow up visit  Patient will work with the pharmacist to discuss resources for cost of Eliquis    Interventions Today    Flowsheet Row Most Recent Value  Chronic Disease   Chronic disease during today's visit Congestive Heart Failure (CHF), Other  [fatigue]  General Interventions   General Interventions Discussed/Reviewed General Interventions Discussed, General Interventions Reviewed, Doctor Visits, Labs, Durable Medical Equipment (DME)  Doctor Visits Discussed/Reviewed Doctor Visits Discussed, Doctor Visits Reviewed, PCP, Specialist  Durable Medical Equipment (DME) BP Cuff  Exercise Interventions   Exercise Discussed/Reviewed Exercise Discussed, Exercise Reviewed, Physical Activity  Physical Activity Discussed/Reviewed Physical Activity Reviewed, Physical Activity Discussed, PREP  Education Interventions   Education Provided Provided Education  Provided Verbal Education On When to see the doctor, Exercise, Medication, Nutrition  Nutrition Interventions   Nutrition Discussed/Reviewed Nutrition Discussed, Nutrition Reviewed, Fluid intake  Pharmacy Interventions   Pharmacy Dicussed/Reviewed Pharmacy Topics Discussed, Pharmacy Topics Reviewed, Medications and their functions, Affording Medications, Referral to Pharmacist  Referral to Pharmacist Cannot afford medications  [Eliquis]       SDOH assessments and interventions completed:  Yes  SDOH Interventions Today    Flowsheet Row Most Recent Value  SDOH Interventions   Food Insecurity Interventions  Intervention Not Indicated  Housing Interventions Intervention Not Indicated  Transportation Interventions Intervention Not Indicated  Utilities Interventions AMB Referral        Care Coordination Interventions:  Yes, provided   Follow up plan: Referral made to [id number] referral sent for help with utilities; sent pharmacy referral to [facility] Ausdall RPH-CPP to assist with cost of Eliquis  Follow up call scheduled for 08/23/23 @2 :30 PM     Encounter Outcome:  Patient Visit Completed

## 2023-06-26 ENCOUNTER — Telehealth: Payer: Self-pay | Admitting: *Deleted

## 2023-06-26 ENCOUNTER — Telehealth (HOSPITAL_COMMUNITY): Payer: Self-pay

## 2023-06-26 ENCOUNTER — Other Ambulatory Visit (HOSPITAL_COMMUNITY): Payer: Self-pay

## 2023-06-26 NOTE — Telephone Encounter (Signed)
Advanced Heart Failure Patient Advocate Encounter  Returned pt call regarding cost of Eliquis.  Spoke with patient by phone on 06/25/23, she does have a deductible for 2025 that has not yet been met, and has been informed that her current refill for Eliquis is apx $300. Test billing indicates that refills will have copays of $47 for 30 days, or $141 for 90 days.  I've informed pt about deductible, the cost of copays after deductible, and the 3% out of pocket requirement for BMS assistance. Pt expressed understanding, and will reach out to me with further questions or concerns.  Burnell Blanks, CPhT Rx Patient Advocate Phone: [phone]

## 2023-06-26 NOTE — Progress Notes (Signed)
Complex Care Management Care Guide Note  06/26/2023 Name: [name] MRN: [id number] DOB: 26-May-1935  [name] is a 89 y.o. year old female who is a primary care patient of [name], MD and is actively engaged with the care management team. I reached out to [name] by phone today to assist with scheduling  with the BSW.  Follow up plan: Telephone appointment with complex care management team member scheduled for:  07/12/23  Gwenevere Ghazi  [facility]  [facility], [facility] Guide  Direct Dial: [phone]  Fax [phone]

## 2023-07-03 ENCOUNTER — Encounter: Payer: Self-pay | Admitting: *Deleted

## 2023-07-03 NOTE — Progress Notes (Signed)
[name] attended 8 of 24 education/exercise classes throughout the 12 week program. She did not attend final FIT testing of come for a final assessment with the RN health coach. PREP incomplete.

## 2023-07-12 ENCOUNTER — Encounter: Payer: Self-pay | Admitting: Licensed Clinical Social Worker

## 2023-07-16 ENCOUNTER — Encounter: Payer: Self-pay | Admitting: Licensed Clinical Social Worker

## 2023-07-16 ENCOUNTER — Ambulatory Visit: Payer: Medicare Other | Attending: Internal Medicine

## 2023-07-16 VITALS — Ht 68.0 in | Wt 155.0 lb

## 2023-07-16 DIAGNOSIS — Z0000 Encounter for general adult medical examination without abnormal findings: Secondary | ICD-10-CM | POA: Diagnosis not present

## 2023-07-16 NOTE — Progress Notes (Signed)
Subjective:   [name] is a 89 y.o. female who presents for Medicare Annual (Subsequent) preventive examination.  Visit Complete: Virtual I connected with  [name] on 07/16/23 by a audio enabled telemedicine application and verified that I am speaking with the correct person using two identifiers.  Patient Location: Home  Provider Location: Office/Clinic  I discussed the limitations of evaluation and management by telemedicine. The patient expressed understanding and agreed to proceed.  Vital Signs: Because this visit was a virtual/telehealth visit, some criteria may be missing or patient reported. Any vitals not documented were not able to be obtained and vitals that have been documented are patient reported.  This patient declined Interactive audio and Acupuncturist. Therefore the visit was completed with audio only.  Cardiac Risk Factors include: advanced age (>16men, >27 women);hypertension;dyslipidemia;family history of premature cardiovascular disease     Objective:    Today's Vitals   07/16/23 1002  Weight: 155 lb (70.3 kg)  Height: 5\' 8"  (1.727 m)  PainSc: 0-No pain   Body mass index is 23.57 kg/m.     07/16/2023   10:04 AM 07/13/2022   10:40 AM 05/12/2020   10:20 AM 05/09/2020   10:34 AM 04/05/2020    9:10 AM 06/13/2018    9:15 AM 01/10/2018    9:08 AM  Advanced Directives  Does Patient Have a Medical Advance Directive? No Yes Yes Yes Yes Yes Yes  Type of Advance Directive   Living will;Healthcare Power of [employer] Power of [place];Living will  Healthcare Power of [place];Living will   Does patient want to make changes to medical advance directive?   No - Guardian declined No - Patient declined     Copy of Healthcare Power of Attorney in Chart?   No - copy requested No - copy requested  No - copy requested   Would patient like information on creating a medical advance directive? No - Patient declined          Current Medications  (verified) Outpatient Encounter Medications as of 07/16/2023  Medication Sig   acetaminophen (TYLENOL) 500 MG tablet Take 500-1,000 mg by mouth every 6 (six) hours as needed (for pain.).   BLACK CURRANT SEED OIL PO Patient takes 1 teaspoon by mouth every night.   Cholecalciferol (VITAMIN D) 125 MCG (5000 UT) CAPS Take by mouth in the morning and at bedtime.   Co-Enzyme Q10 100 MG CAPS Take by mouth 2 (two) times daily.   DHA-EPA-Vitamin E (OMEGA-3 COMPLEX PO) Take 1 tablet by mouth 2 (two) times daily.   ELIQUIS 2.5 MG TABS tablet Take 1 tablet by mouth twice daily   ferrous sulfate 325 (65 FE) MG tablet Take 325 mg by mouth daily.   GENERIC EXTERNAL MEDICATION daily. Advanced Cholesterol Solution by Dr. Mel Almond (orders on line)   losartan (COZAAR) 25 MG tablet Take 1 tablet (25 mg total) by mouth 2 (two) times daily.   Magnesium 200 MG CHEW Chew 400 mg by mouth daily.   spironolactone (ALDACTONE) 25 MG tablet TAKE 1 TABLET BY MOUTH ONCE DAILY . APPOINTMENT REQUIRED FOR FUTURE REFILLS WITH  THE  HEART  FAILURE  CLINIC   amoxicillin (AMOXIL) 500 MG tablet Take 4 tablets (2,000 mg total) 1 hour prior to all dental visits. (Patient not taking: Reported on 06/25/2023)   atorvastatin (LIPITOR) 40 MG tablet Take 1 tablet (40 mg total) by mouth daily. (Patient not taking: Reported on 06/25/2023)   B-12, Methylcobalamin, 1000 MCG SUBL Take  1,000 mcg by mouth daily.   furosemide (LASIX) 40 MG tablet Take 40 mg by mouth as needed. (Patient not taking: Reported on 06/25/2023)   latanoprost (XALATAN) 0.005 % ophthalmic solution Place 1 drop into both eyes at bedtime. (Patient not taking: Reported on 06/25/2023)   Multiple Vitamin (MULTIVITAMIN WITH MINERALS) TABS tablet Take 1 tablet by mouth daily. (Patient not taking: Reported on 06/25/2023)   nitroGLYCERIN (NITROSTAT) 0.3 MG SL tablet DISSOLVE ONE TABLET UNDER THE TONGUE EVERY 5 MINUTES AS NEEDED FOR CHEST PAIN.  DO NOT EXCEED A TOTAL OF 3 DOSES IN 15 MINUTES  NOW (Patient not taking: Reported on 06/25/2023)   No facility-administered encounter medications on file as of 07/16/2023.    Allergies (verified) Lisinopril   History: Past Medical History:  Diagnosis Date   Anemia    Anxiety    Atrial fibrillation (HCC)    Breast cancer of lower-outer quadrant of left female breast (HCC) 10/11/2014   treated with lumpectomy and radiation   Chronic systolic CHF (congestive heart failure) (HCC)    CKD (chronic kidney disease)    Coronary artery disease 07/27/2017   GERD (gastroesophageal reflux disease)    Hypertension    Mitral regurgitation    Myocardial infarction (HCC) 07/27/2017   STEMI of anterolateral wall. Cath w/ stent and cabg by Dr. Swaziland   Osteoporosis 10/20/2014   Personal history of radiation therapy 2016   treatment of breast cancer   PONV (postoperative nausea and vomiting)    pt denies this.   PUD (peptic ulcer disease)    Pure hypercholesterolemia 04/08/2014   S/P mitral valve clip implantation 05/12/2020   s/p TEER with two NTW Mitraclip devices by Dr. Torrie Mayers, kidney    Past Surgical History:  Procedure Laterality Date   BREAST LUMPECTOMY Left 2016   BREAST LUMPECTOMY WITH SENTINEL LYMPH NODE BIOPSY Left 10/22/2014   Procedure: BREAST LUMPECTOMY WITH SENTINEL LYMPH NODE BIOPSY;  Surgeon: Chevis Pretty III, MD;  Location: [facility];  Service: General;  Laterality: Left;   CARDIAC CATHETERIZATION     CARDIOVERSION N/A 06/13/2018   Procedure: CARDIOVERSION;  Surgeon: Dolores Patty, MD;  Location[facility] ENDOSCOPY;  Service: Cardiovascular;  Laterality: N/A;   CORONARY STENT INTERVENTION N/A 07/27/2017   Procedure: CORONARY STENT INTERVENTION;  Surgeon: Swaziland, Peter M, MD;  Location: [facility] INVASIVE CV LAB;  Service: Cardiovascular;  Laterality: N/A;   CORONARY/GRAFT ACUTE MI REVASCULARIZATION N/A 07/27/2017   Procedure: Coronary/Graft Acute MI Revascularization;  Surgeon: Swaziland, Peter M, MD;  Location: [facility]  INVASIVE CV LAB;  Service: Cardiovascular;  Laterality: N/A;   LEFT HEART CATH AND CORONARY ANGIOGRAPHY N/A 07/27/2017   Procedure: LEFT HEART CATH AND CORONARY ANGIOGRAPHY;  Surgeon: Swaziland, Peter M, MD;  Location: [facility] INVASIVE CV LAB;  Service: Cardiovascular;  Laterality: N/A;   RE-EXCISION OF BREAST CANCER,SUPERIOR MARGINS Left 11/04/2014   Procedure: RE-EXCISION OF LEFT BREAST INFERIOR MARGINS;  Surgeon: Chevis Pretty III, MD;  Location: [facility] OR;  Service: General;  Laterality: Left;   RIGHT HEART CATH N/A 04/05/2020   Procedure: RIGHT HEART CATH;  Surgeon: Dolores Patty, MD;  Location: [facility] INVASIVE CV LAB;  Service: Cardiovascular;  Laterality: N/A;   TEE WITHOUT CARDIOVERSION N/A 04/05/2020   Procedure: TRANSESOPHAGEAL ECHOCARDIOGRAM (TEE);  Surgeon: Dolores Patty, MD;  Location: [facility] ENDOSCOPY;  Service: Cardiovascular;  Laterality: N/A;   TEE WITHOUT CARDIOVERSION N/A 05/12/2020   Procedure: TRANSESOPHAGEAL ECHOCARDIOGRAM (TEE);  Surgeon: Tonny Bollman, MD;  Location: [facility] INVASIVE  CV LAB;  Service: Cardiovascular;  Laterality: N/A;   TRANSCATHETER MITRAL EDGE TO EDGE REPAIR N/A 05/12/2020   Procedure: MITRAL VALVE REPAIR;  Surgeon: Tonny Bollman, MD;  Location: [facility] INVASIVE CV LAB;  Service: Cardiovascular;  Laterality: N/A;   Family History  Problem Relation Age of Onset   Pancreatic cancer Mother        pancreastic   Stroke Father    Hypertension Brother    Hypertension Sister    Hypertension Brother    Heart disease Brother    Hypertension Sister    Social History   Socioeconomic History   Marital status: Widowed    Spouse name: Not on file   Number of children: Not on file   Years of education: Not on file   Highest education level: Some college, no degree  Occupational History   Occupation: retired  Tobacco Use   Smoking status: Never   Smokeless tobacco: Never  Vaping Use   Vaping status: Never Used  Substance and Sexual Activity   Alcohol use: No   Drug use: No    Sexual activity: Not on file  Other Topics Concern   Not on file  Social History Narrative   Not on file   Social Drivers of Health   Financial Resource Strain: Low Risk  (07/16/2023)   Overall Financial Resource Strain (CARDIA)    Difficulty of Paying Living Expenses: Not very hard  Food Insecurity: No Food Insecurity (07/16/2023)   Hunger Vital Sign    Worried About Running Out of Food in the Last Year: Never true    Ran Out of Food in the Last Year: Never true  Transportation Needs: No Transportation Needs (07/16/2023)   PRAPARE - Administrator, Civil Service (Medical): No    Lack of Transportation (Non-Medical): No  Physical Activity: Insufficiently Active (07/16/2023)   Exercise Vital Sign    Days of Exercise per Week: 3 days    Minutes of Exercise per Session: 20 min  Stress: No Stress Concern Present (07/16/2023)   [employer] of Occupational Health - Occupational Stress Questionnaire    Feeling of Stress : Not at all  Social Connections: Moderately Integrated (07/16/2023)   Social Connection and Isolation Panel [NHANES]    Frequency of Communication with Friends and Family: More than three times a week    Frequency of Social Gatherings with Friends and Family: Once a week    Attends Religious Services: More than 4 times per year    Active Member of [employer] or Organizations: Yes    Attends Banker Meetings: 1 to 4 times per year    Marital Status: Widowed    Tobacco Counseling Counseling given: Not Answered   Clinical Intake:  Pre-visit preparation completed: Yes  Pain : No/denies pain Pain Score: 0-No pain     BMI - recorded: 23.57 Nutritional Risks: None Diabetes: No  How often do you need to have someone help you when you read instructions, pamphlets, or other written materials from your doctor or pharmacy?: 1 - Never What is the last grade level you completed in school?: SOME COLLEGE  Interpreter Needed?: No  Information  entered by :: Ramaya Guile N. Odyssey Vasbinder, LPN.   Activities of Daily Living    07/16/2023   10:08 AM  In your present state of health, do you have any difficulty performing the following activities:  Hearing? 0  Vision? 0  Difficulty concentrating or making decisions? 0  Walking or climbing stairs? 0  Dressing or bathing? 0  Doing errands, shopping? 0  Preparing Food and eating ? N  Using the Toilet? N  In the past six months, have you accidently leaked urine? N  Do you have problems with loss of bowel control? N  Managing your Medications? N  Managing your Finances? N  Housekeeping or managing your Housekeeping? N    Patient Care Team: [name], MD as PCP - General (Internal Medicine) Bensimhon, Bevelyn Buckles, MD as PCP - Advanced Heart Failure (Cardiology) [name], MD as Consulting Physician (Gastroenterology) [name], MD as Consulting Physician (Cardiology) Clarene Duke, Karma Lew, RN as [facility] as Therapist, music (Optometry)  Indicate any recent Medical Services you may have received from other than [facility] providers in the past year (date may be approximate).     Assessment:   This is a routine wellness examination for [facility].  Hearing/Vision screen Hearing Screening - Comments:: Denies hearing difficulties. No hearing aids. Uses Debrox to clean out ears.  Vision Screening - Comments:: Wears rx glasses - up to date with routine eye exams with [facility]    Goals Addressed             This Visit's Progress    To remain indepedent, increase physical activity and socially involved.        Depression Screen    07/16/2023   10:09 AM 11/12/2022   10:14 AM 11/12/2022   10:13 AM 08/10/2022   12:00 PM 07/21/2019    9:45 AM 01/20/2019    9:44 AM 01/17/2018    3:24 PM  PHQ 2/9 Scores  PHQ - 2 Score 0 0 0 0 0 0 0  PHQ- 9 Score 1 0         Fall Risk    07/16/2023   10:06 AM 11/12/2022   10:09 AM 07/13/2022    10:40 AM 07/12/2022   12:18 PM 07/21/2019    9:45 AM  Fall Risk   Falls in the past year? 0 0 0 0 0  Number falls in past yr: 0 0 0  0  Injury with Fall? 0 0 0  0  Risk for fall due to : No Fall Risks No Fall Risks     Follow up Falls prevention discussed;Falls evaluation completed    Falls evaluation completed    MEDICARE RISK AT HOME: Medicare Risk at Home Any stairs in or around the home?: Yes (front porch only) If so, are there any without handrails?: No Home free of loose throw rugs in walkways, pet beds, electrical cords, etc?: Yes Adequate lighting in your home to reduce risk of falls?: Yes Life alert?: No Use of a cane, walker or w/c?: No Grab bars in the bathroom?: No Shower chair or bench in shower?: Yes Elevated toilet seat or a handicapped toilet?: No  TIMED UP AND GO:  Was the test performed?  No    Cognitive Function:    07/16/2023   10:08 AM 07/13/2022   10:46 AM  MMSE - Mini Mental State Exam  Not completed: Unable to complete Unable to complete        07/16/2023   10:08 AM 07/13/2022   10:46 AM  6CIT Screen  What Year? 0 points 0 points  What month? 0 points 0 points  What time? 0 points 0 points  Count back from 20 0 points 0 points  Months in reverse 0 points 0 points  Repeat phrase  0 points 0 points  Total Score 0 points 0 points    Immunizations Immunization History  Administered Date(s) Administered   Fluad Quad(high Dose 65+) 02/20/2022   Hepatitis B 07/15/2004, 08/19/2004, 01/12/2005   Influenza-Unspecified 04/24/2023   PFIZER Comirnaty(Gray Top)Covid-19 Tri-Sucrose Vaccine 02/20/2022   PFIZER(Purple Top)SARS-COV-2 Vaccination 06/26/2019, 07/17/2019, 03/22/2020   Pfizer Covid-19 Vaccine Bivalent Booster 30yrs & up 11/03/2020, 04/08/2021   Pfizer(Comirnaty)Fall Seasonal Vaccine 12 years and older 04/24/2023   Pneumococcal Conjugate-13 11/05/2016   Pneumococcal Polysaccharide-23 01/10/2018   Td 07/15/2004, 09/09/2014    TDAP status: Up  to date  Flu Vaccine status: Up to date  Pneumococcal vaccine status: Up to date  Covid-19 vaccine status: Completed vaccines  Qualifies for Shingles Vaccine? Yes   Zostavax completed No   Shingrix Completed?: No.    Education has been provided regarding the importance of this vaccine. Patient has been advised to call insurance company to determine out of pocket expense if they have not yet received this vaccine. Advised may also receive vaccine at local pharmacy or Health Dept. Verbalized acceptance and understanding.  Screening Tests Health Maintenance  Topic Date Due   Zoster Vaccines- Shingrix (1 of 2) Never done   COVID-19 Vaccine (8 - 2024-25 season) 06/19/2023   MAMMOGRAM  08/30/2023   Medicare Annual Wellness (AWV)  07/15/2024   DTaP/Tdap/Td (3 - Tdap) 09/08/2024   DEXA SCAN  12/13/2024   Pneumonia Vaccine 51+ Years old  Completed   INFLUENZA VACCINE  Completed   HPV VACCINES  Aged Out    Health Maintenance  Health Maintenance Due  Topic Date Due   Zoster Vaccines- Shingrix (1 of 2) Never done   COVID-19 Vaccine (8 - 2024-25 season) 06/19/2023    Colorectal cancer screening: No longer required.   Mammogram status: Completed 08/30/2022. Repeat every year  Bone Density status: Completed 12/14/2022. Results reflect: Bone density results: OSTEOPOROSIS. Repeat every 2 years.  Lung Cancer Screening: (Low Dose CT Chest recommended if Age 31-80 years, 20 pack-year currently smoking OR have quit w/in 15years.) does not qualify.   Lung Cancer Screening Referral: no  Additional Screening:  Hepatitis C Screening: does not qualify; Completed: No  Vision Screening: Recommended annual ophthalmology exams for early detection of glaucoma and other disorders of the eye. Is the patient up to date with their annual eye exam?  Yes  Who is the provider or what is the name of the office in which the patient attends annual eye exams? [facility] If pt is not  established with a provider, would they like to be referred to a provider to establish care? No .   Dental Screening: Recommended annual dental exams for proper oral hygiene.  Community Resource Referral / Chronic Care Management: CRR required this visit?  No   CCM required this visit?  No     Plan:     I have personally reviewed and noted the following in the patient's chart:   Medical and social history Use of alcohol, tobacco or illicit drugs  Current medications and supplements including opioid prescriptions. Patient is not currently taking opioid prescriptions. Functional ability and status Nutritional status Physical activity Advanced directives List of other physicians Hospitalizations, surgeries, and ER visits in previous 12 months Vitals Screenings to include cognitive, depression, and falls Referrals and appointments  In addition, I have reviewed and discussed with patient certain preventive protocols, quality metrics, and best practice recommendations. A written personalized care plan for preventive services as well as general  preventive health recommendations were provided to patient.     Mickeal Needy, LPN   06/22/1476   After Visit Summary: (MyChart) Due to this being a telephonic visit, the after visit summary with patients personalized plan was offered to patient via MyChart   Nurse Notes: Patient is due for Shingrix vaccine.

## 2023-07-16 NOTE — Patient Instructions (Signed)
[name] , Thank you for taking time to come for your Medicare Wellness Visit. I appreciate your ongoing commitment to your health goals. Please review the following plan we discussed and let me know if I can assist you in the future.   Referrals/Orders/Follow-Ups/Clinician Recommendations: Yes; Keep maintaining your health by keeping your appointments with Dr. Laural Benes and any specialists that you may see.  Call us if you need anything.  Have a great year!!!!  This is a list of the screening recommended for you and due dates:  Health Maintenance  Topic Date Due   Zoster (Shingles) Vaccine (1 of 2) Never done   COVID-19 Vaccine (8 - 2024-25 season) 06/19/2023   Mammogram  08/30/2023   Medicare Annual Wellness Visit  07/15/2024   DTaP/Tdap/Td vaccine (3 - Tdap) 09/08/2024   DEXA scan (bone density measurement)  12/13/2024   Pneumonia Vaccine  Completed   Flu Shot  Completed   HPV Vaccine  Aged Out    Advanced directives: (Declined) Advance directive discussed with you today. Even though you declined this today, please call our office should you change your mind, and we can give you the proper paperwork for you to fill out.  Next Medicare Annual Wellness Visit scheduled for next year: Yes

## 2023-07-17 ENCOUNTER — Telehealth (HOSPITAL_COMMUNITY): Payer: Self-pay | Admitting: Internal Medicine

## 2023-07-22 ENCOUNTER — Telehealth: Payer: Self-pay | Admitting: *Deleted

## 2023-07-22 ENCOUNTER — Encounter: Payer: Self-pay | Admitting: Licensed Clinical Social Worker

## 2023-07-22 NOTE — Progress Notes (Signed)
Complex Care Management Care Guide Note  07/22/2023 Name: EDILIA GHUMAN MRN: [id number] DOB: 1934/09/08  DERIONNA SALVADOR is a 89 y.o. year old female who is a primary care patient of [name], MD and is actively engaged with the care management team. I reached out to [name] by phone today to assist with re-scheduling  with the BSW.  Follow up plan: Patient declines to reschedule with BSW.   Gwenevere Ghazi  [facility]  Value-Based Care Institute, [facility] Guide  Direct Dial: [phone]  Fax [phone]

## 2023-07-22 NOTE — Progress Notes (Signed)
Complex Care Management Care Guide Note  07/22/2023 Name: [name] MRN: [id number] DOB: 04/22/1935  [name] is a 89 y.o. year old female who is a primary care patient of [name], MD and is actively engaged with the care management team. I reached out to [name] by phone today to assist with re-scheduling  with the BSW.  Follow up plan: Unsuccessful telephone outreach attempt made. A HIPAA compliant phone message was left for the patient providing contact information and requesting a return call.  [name]  [facility]  Value-Based Care Institute, [facility] Guide  Direct Dial: [phone]  Fax [phone]

## 2023-08-05 ENCOUNTER — Encounter (HOSPITAL_COMMUNITY): Payer: Medicare Other | Admitting: Cardiology

## 2023-08-29 ENCOUNTER — Other Ambulatory Visit (HOSPITAL_COMMUNITY): Payer: Self-pay | Admitting: Internal Medicine

## 2023-09-04 ENCOUNTER — Other Ambulatory Visit (HOSPITAL_COMMUNITY): Payer: Self-pay | Admitting: Internal Medicine

## 2023-09-06 ENCOUNTER — Ambulatory Visit: Payer: Self-pay

## 2023-09-06 NOTE — Patient Instructions (Signed)
 Visit Information  Thank you for taking time to visit with me today. Please don't hesitate to contact me if I can be of assistance to you.   Following are the goals we discussed today:   Goals Addressed             This Visit's Progress    To self-manage CHF   On track    Care Coordination Interventions: Basic overview and discussion of pathophysiology of Heart Failure reviewed Re-educated patient reg: Heart Failure Action Plan in depth and mailed a printed copy Assessed need for readable accurate scales in home Provided education about placing scale on hard, flat surface Advised patient to weigh each morning after emptying bladder Discussed importance of daily weight and advised patient to weigh and record daily Counseled patient on the importance of keeping all scheduled MD follow up appointments, she will call her Cardiologist today in order to reschedule her missed appointment  Discussed plans with patient for ongoing care coordination follow up and provided patient with direct contact information for nurse care coordinator Scheduled nurse follow up call for 10/07/23 @10 :30 AM Patient will weigh herself daily and record her weights Patient will follow the heart failure action plan and contact her Cardiologist to report symptoms or concerns Patient will contact [facility] to schedule a follow up visit        Our next appointment is by telephone on 10/07/23 at 10:30 AM  Please call the care guide team at [phone] if you need to cancel or reschedule your appointment.   If you are experiencing a Mental Health or Behavioral Health Crisis or need someone to talk to, please call 1-800-273-TALK (toll free, 24 hour hotline)  Patient verbalizes understanding of instructions and care plan provided today and agrees to view in MyChart. Active MyChart status and patient understanding of how to access instructions and care plan via MyChart confirmed with  patient.     Delsa Sale RN BSN CCM [place]  [facility], [facility] Nurse Care Coordinator  Direct Dial: [phone] Website: [email or web address]

## 2023-09-06 NOTE — Patient Outreach (Signed)
 Care Coordination   Follow Up Visit Note   09/06/2023 Name: [name] MRN: [id number] DOB: [birth year]  [name] is a 89 y.o. year old female who sees [name], MD for primary care. I spoke with  [name] by phone today.  What matters to the patients health and wellness today?  Patient would like to eat out less in order to help reduce her Sodium intake. She would like to reschedule her follow up with her Cardiologist.     Goals Addressed             This Visit's Progress    To self-manage CHF   On track    Care Coordination Interventions: Basic overview and discussion of pathophysiology of Heart Failure reviewed Re-educated patient reg: Heart Failure Action Plan in depth and mailed a printed copy Assessed need for readable accurate scales in home Provided education about placing scale on hard, flat surface Advised patient to weigh each morning after emptying bladder Discussed importance of daily weight and advised patient to weigh and record daily Counseled patient on the importance of keeping all scheduled MD follow up appointments, she will call her Cardiologist today in order to reschedule her missed appointment  Discussed plans with patient for ongoing care coordination follow up and provided patient with direct contact information for nurse care coordinator Scheduled nurse follow up call for 10/07/23 @10 :30 AM Patient will weigh herself daily and record her weights Patient will follow the heart failure action plan and contact her Cardiologist to report symptoms or concerns Patient will contact [facility] to schedule a follow up visit    Interventions Today    Flowsheet Row Most Recent Value  Chronic Disease   Chronic disease during today's visit Congestive Heart Failure (CHF)  General Interventions   General Interventions Discussed/Reviewed General Interventions Discussed, General Interventions Reviewed, Doctor Visits,  Labs  Doctor Visits Discussed/Reviewed Doctor Visits Discussed, Doctor Visits Reviewed, Specialist  Education Interventions   Education Provided Provided Education  Provided Verbal Education On When to see the doctor, Medication, Nutrition  Nutrition Interventions   Nutrition Discussed/Reviewed Nutrition Reviewed, Nutrition Discussed, Decreasing salt  Pharmacy Interventions   Pharmacy Dicussed/Reviewed Pharmacy Topics Discussed, Pharmacy Topics Reviewed, Medications and their functions          SDOH assessments and interventions completed:  No     Care Coordination Interventions:  Yes, provided   Follow up plan: Follow up call scheduled for 10/07/23 @10 :30 AM    Encounter Outcome:  Patient Visit Completed

## 2023-09-12 ENCOUNTER — Inpatient Hospital Stay: Payer: Medicare Other | Attending: Hematology and Oncology | Admitting: Hematology and Oncology

## 2023-09-12 VITALS — BP 139/79 | HR 83 | Temp 97.3°F | Resp 16 | Wt 151.7 lb

## 2023-09-12 DIAGNOSIS — C50512 Malignant neoplasm of lower-outer quadrant of left female breast: Secondary | ICD-10-CM | POA: Diagnosis not present

## 2023-09-12 DIAGNOSIS — Z170 Estrogen receptor positive status [ER+]: Secondary | ICD-10-CM

## 2023-09-12 DIAGNOSIS — Z853 Personal history of malignant neoplasm of breast: Secondary | ICD-10-CM | POA: Insufficient documentation

## 2023-09-12 DIAGNOSIS — M818 Other osteoporosis without current pathological fracture: Secondary | ICD-10-CM

## 2023-09-12 DIAGNOSIS — M810 Age-related osteoporosis without current pathological fracture: Secondary | ICD-10-CM | POA: Insufficient documentation

## 2023-09-12 NOTE — Progress Notes (Signed)
 [facility] Cancer Follow up:    [name], MD [address] [place] [address] [place] [address]   DIAGNOSIS:  Cancer Staging  Malignant neoplasm of lower-outer quadrant of left breast of female, estrogen receptor positive (HCC) Staging form: Breast, AJCC 7th Edition - Clinical stage from 10/13/2014: Stage IA (T1c, N0, M0) - Signed by Rachel Moulds, MD on 09/12/2023 Staged by: Pathologist and managing physician Laterality: Left Estrogen receptor status: Positive Progesterone receptor status: Positive HER2 status: Negative Stage used in treatment planning: Yes National guidelines used in treatment planning: Yes Type of national guideline used in treatment planning: NCCN Staging comments: Staged in breast conference on 5.11.16   SUMMARY OF ONCOLOGIC HISTORY: Oncology History  Malignant neoplasm of lower-outer quadrant of left breast of female, estrogen receptor positive (HCC)  09/27/2014 Mammogram   Left breast: new irregularly-shaped mass outer quadrant corresponding to palpable area of concern. 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   09/27/2014 Breast US   Left breast: irregular hypoechoic mass with prominent internal vascular flow at 3:30 position 2 cm from the nipple that measures 2.0 x 1.9 x 1.2 cm.   10/05/2014 Initial Biopsy   Left breast core needle biopsy: Invasive mannary carcinoma with extravasated mucin, grade 1, ER+ (90%), PR+ (90%), HER2/neu negative (ratio 1.06), Ki67 5%.   10/05/2014 Clinical Stage   Stage IA: T1c N0   10/13/2014 Cancer Staging   Staging form: Breast, AJCC 7th Edition - Clinical stage from 10/13/2014: Stage IA (T1c, N0, M0) - Signed by Rachel Moulds, MD on 09/12/2023 Staged by: Pathologist and managing physician Laterality: Left Estrogen receptor status: Positive Progesterone receptor status: Positive HER2 status: Negative Stage used in treatment planning: Yes National guidelines used in  treatment planning: Yes Type of national guideline used in treatment planning: NCCN Staging comments: Staged in breast conference on 5.11.16   10/22/2014 Definitive Surgery   Left lumpectomy/SLNB Carolynne Edouard): Invasive mammary carcinoma, mixed mucinous and ductal variant, HER2/neu repeated and remains negative (ratio 1.28), positive margins,  1 LN removed and negative for malignancy.   10/22/2014 Pathologic Stage   Stage IA: pT1c pN0   11/04/2014 Surgery   Re-excision of left breast margins: clear; no evidence of residual tumor.   12/21/2014 - 01/11/2015 Radiation Therapy   Adjuvant RT Basilio Cairo): Left Breast 42.56 Gy over 16 fractions   02/10/2015 -  Anti-estrogen oral therapy   Anastrozole 1 mg daily.  Planned duration of therapy 5 years    Survivorship   Survivorship care plan completed and mailed to patient in lieu of in person visit.     CURRENT THERAPY: Observation  INTERVAL HISTORY:  [name] 89 y.o. female returns for f/u of her h/o breast cancer.  Last mammogram done on August 30, 2022 with no mammographic evidence of malignancy.  Discussed the use of AI scribe software for clinical note transcription with the patient, who gave verbal consent to proceed.  History of Present Illness NAKOTA ACKERT is an 89 year old female who presents for routine follow-up and management of osteoporosis.  She has not received her Prolia injection for osteoporosis since October 2024 and is due for another. She typically receives this injection during her visits and prefers to schedule it at a convenient time due to travel plans.  She is due for a mammogram, as the previous order expired on August 30, 2023. No changes or issues in her breasts have been noted.  She experiences changes in breathing  when exposed to cold temperatures, which normalize upon warming up. She does not have asthma.  She had a toenail removed due to an infection but has not consulted other doctors for specific reasons.  No  new dental problems or swelling in her legs.   Patient Active Problem List   Diagnosis Date Noted   Primary hyperparathyroidism (HCC) 11/12/2022   Chronic systolic heart failure (HCC) 07/12/2022   Acquired thrombophilia (HCC) 07/12/2022   Nonrheumatic mitral (valve) insufficiency 05/12/2020   S/P mitral valve clip implantation 05/12/2020   Non-rheumatic mitral regurgitation 04/05/2020   Chronic idiopathic constipation 12/05/2017   Asymptomatic microscopic hematuria 12/05/2017   Unintentional weight loss 12/05/2017   Chronic kidney disease, stage IV (severe) (HCC) 12/05/2017   Anemia associated with chronic renal failure 12/05/2017   Hypercalcemia 12/05/2017   A-fib (HCC)    Pressure injury of skin 07/28/2017   Osteoporosis 10/20/2014   Malignant neoplasm of lower-outer quadrant of left breast of female, estrogen receptor positive (HCC) 10/11/2014   Pure hypercholesterolemia 04/08/2014   Anxiety    PUD (peptic ulcer disease)    HTN (hypertension), benign 06/29/2011   GERD (gastroesophageal reflux disease) 06/29/2011    is allergic to lisinopril.  MEDICAL HISTORY: Past Medical History:  Diagnosis Date   Anemia    Anxiety    Atrial fibrillation (HCC)    Breast cancer of lower-outer quadrant of left female breast (HCC) 10/11/2014   treated with lumpectomy and radiation   Chronic systolic CHF (congestive heart failure) (HCC)    CKD (chronic kidney disease)    Coronary artery disease 07/27/2017   GERD (gastroesophageal reflux disease)    Hypertension    Mitral regurgitation    Myocardial infarction (HCC) 07/27/2017   STEMI of anterolateral wall. Cath w/ stent and cabg by Dr. Swaziland   Osteoporosis 10/20/2014   Personal history of radiation therapy 2016   treatment of breast cancer   PONV (postoperative nausea and vomiting)    pt denies this.   PUD (peptic ulcer disease)    Pure hypercholesterolemia 04/08/2014   S/P mitral valve clip implantation 05/12/2020   s/p TEER with  two NTW Mitraclip devices by Dr. Torrie Mayers, kidney     SURGICAL HISTORY: Past Surgical History:  Procedure Laterality Date   BREAST LUMPECTOMY Left 2016   BREAST LUMPECTOMY WITH SENTINEL LYMPH NODE BIOPSY Left 10/22/2014   Procedure: BREAST LUMPECTOMY WITH SENTINEL LYMPH NODE BIOPSY;  Surgeon: Chevis Pretty III, MD;  Location: [facility];  Service: General;  Laterality: Left;   CARDIAC CATHETERIZATION     CARDIOVERSION N/A 06/13/2018   Procedure: CARDIOVERSION;  Surgeon: Dolores Patty, MD;  Location: [facility] ENDOSCOPY;  Service: Cardiovascular;  Laterality: N/A;   CORONARY STENT INTERVENTION N/A 07/27/2017   Procedure: CORONARY STENT INTERVENTION;  Surgeon: Swaziland, Peter M, MD;  Location: [facility] INVASIVE CV LAB;  Service: Cardiovascular;  Laterality: N/A;   CORONARY/GRAFT ACUTE MI REVASCULARIZATION N/A 07/27/2017   Procedure: Coronary/Graft Acute MI Revascularization;  Surgeon: Swaziland, Peter M, MD;  Location: [facility] INVASIVE CV LAB;  Service: Cardiovascular;  Laterality: N/A;   LEFT HEART CATH AND CORONARY ANGIOGRAPHY N/A 07/27/2017   Procedure: LEFT HEART CATH AND CORONARY ANGIOGRAPHY;  Surgeon: Swaziland, Peter M, MD;  Location: [facility] INVASIVE CV LAB;  Service: Cardiovascular;  Laterality: N/A;   RE-EXCISION OF BREAST CANCER,SUPERIOR MARGINS Left 11/04/2014   Procedure: RE-EXCISION OF LEFT BREAST INFERIOR MARGINS;  Surgeon: Chevis Pretty III, MD;  Location: [facility] OR;  Service: General;  Laterality:  Left;   RIGHT HEART CATH N/A 04/05/2020   Procedure: RIGHT HEART CATH;  Surgeon: Dolores Patty, MD;  Location: [facility] INVASIVE CV LAB;  Service: Cardiovascular;  Laterality: N/A;   TEE WITHOUT CARDIOVERSION N/A 04/05/2020   Procedure: TRANSESOPHAGEAL ECHOCARDIOGRAM (TEE);  Surgeon: Dolores Patty, MD;  Location: [facility] ENDOSCOPY;  Service: Cardiovascular;  Laterality: N/A;   TEE WITHOUT CARDIOVERSION N/A 05/12/2020   Procedure: TRANSESOPHAGEAL ECHOCARDIOGRAM (TEE);  Surgeon: Tonny Bollman, MD;   Location: [facility] INVASIVE CV LAB;  Service: Cardiovascular;  Laterality: N/A;   TRANSCATHETER MITRAL EDGE TO EDGE REPAIR N/A 05/12/2020   Procedure: MITRAL VALVE REPAIR;  Surgeon: Tonny Bollman, MD;  Location[facility] INVASIVE CV LAB;  Service: Cardiovascular;  Laterality: N/A;    SOCIAL HISTORY: Social History   Socioeconomic History   Marital status: Widowed    Spouse name: Not on file   Number of children: Not on file   Years of education: Not on file   Highest education level: Some college, no degree  Occupational History   Occupation: retired  Tobacco Use   Smoking status: Never   Smokeless tobacco: Never  Vaping Use   Vaping status: Never Used  Substance and Sexual Activity   Alcohol use: No   Drug use: No   Sexual activity: Not on file  Other Topics Concern   Not on file  Social History Narrative   Not on file   Social Drivers of Health   Financial Resource Strain: Low Risk  (07/16/2023)   Overall Financial Resource Strain (CARDIA)    Difficulty of Paying Living Expenses: Not very hard  Food Insecurity: No Food Insecurity (07/16/2023)   Hunger Vital Sign    Worried About Running Out of Food in the Last Year: Never true    Ran Out of Food in the Last Year: Never true  Transportation Needs: No Transportation Needs (07/16/2023)   PRAPARE - Administrator, Civil Service (Medical): No    Lack of Transportation (Non-Medical): No  Physical Activity: Insufficiently Active (07/16/2023)   Exercise Vital Sign    Days of Exercise per Week: 3 days    Minutes of Exercise per Session: 20 min  Stress: No Stress Concern Present (07/16/2023)   [employer] of Occupational Health - Occupational Stress Questionnaire    Feeling of Stress : Not at all  Social Connections: Moderately Integrated (07/16/2023)   Social Connection and Isolation Panel [NHANES]    Frequency of Communication with Friends and Family: More than three times a week    Frequency of Social Gatherings with  Friends and Family: Once a week    Attends Religious Services: More than 4 times per year    Active Member of [employer] or Organizations: Yes    Attends Banker Meetings: 1 to 4 times per year    Marital Status: Widowed  Intimate Partner Violence: Not At Risk (07/16/2023)   Humiliation, Afraid, Rape, and Kick questionnaire    Fear of Current or Ex-Partner: No    Emotionally Abused: No    Physically Abused: No    Sexually Abused: No    FAMILY HISTORY: Family History  Problem Relation Age of Onset   Pancreatic cancer Mother        pancreastic   Stroke Father    Hypertension Brother    Hypertension Sister    Hypertension Brother    Heart disease Brother    Hypertension Sister     Review of Systems  Constitutional:  Negative  for appetite change, chills, fatigue, fever and unexpected weight change.  HENT:   Negative for hearing loss, lump/mass and trouble swallowing.   Eyes:  Negative for eye problems and icterus.  Respiratory:  Negative for chest tightness, cough and shortness of breath.   Cardiovascular:  Negative for chest pain, leg swelling and palpitations.  Gastrointestinal:  Negative for abdominal distention, abdominal pain, constipation, diarrhea, nausea and vomiting.  Endocrine: Negative for hot flashes.  Genitourinary:  Negative for difficulty urinating.   Musculoskeletal:  Negative for arthralgias.  Skin:  Negative for itching and rash.  Neurological:  Negative for dizziness, extremity weakness, headaches and numbness.  Hematological:  Negative for adenopathy. Does not bruise/bleed easily.  Psychiatric/Behavioral:  Negative for depression. The patient is not nervous/anxious.       PHYSICAL EXAMINATION  ECOG PERFORMANCE STATUS: 1 - Symptomatic but completely ambulatory  Vitals:   09/12/23 1205  BP: 139/79  Pulse: 83  Resp: 16  Temp: (!) 97.3 F (36.3 C)  SpO2: 100%    Physical Exam Constitutional:      General: She is not in acute distress.     Appearance: Normal appearance. She is not toxic-appearing.  HENT:     Head: Normocephalic and atraumatic.  Eyes:     General: No scleral icterus. Cardiovascular:     Rate and Rhythm: Normal rate and regular rhythm.     Pulses: Normal pulses.     Heart sounds: Normal heart sounds.  Pulmonary:     Effort: Pulmonary effort is normal.     Breath sounds: Normal breath sounds.  Chest:     Comments: Left breast status postlumpectomy and radiation no sign of local recurrence right breast is benign. Abdominal:     General: Abdomen is flat. Bowel sounds are normal. There is no distension.     Palpations: Abdomen is soft.     Tenderness: There is no abdominal tenderness.  Musculoskeletal:        General: No swelling.     Cervical back: Neck supple.  Lymphadenopathy:     Cervical: No cervical adenopathy.  Skin:    General: Skin is warm and dry.     Findings: No rash.  Neurological:     General: No focal deficit present.     Mental Status: She is alert.  Psychiatric:        Mood and Affect: Mood normal.        Behavior: Behavior normal.     LABORATORY DATA:  None for this visit  ASSESSMENT and THERAPY PLAN:   Malignant neoplasm of lower-outer quadrant of left breast of female, estrogen receptor positive (HCC) ASSESSMENT: 89 y.o.  [place] woman status post left breast lower outer quadrant biopsy 10/05/2014 for a clinical T1 N0, stage IA invasive ductal breast cancer, grade 1, estrogen and progesterone receptor positive, HER-2 not amplified, with an MIB-1 of 5%    (1) left lumpectomy and sentinel lymph node sampling 10/22/2014 showed a pT1c pN0, stage IA mixed ductal and mucinous invasive carcinoma, grade 2, repeat HER-2 again negative             (a) positive margins were cleared with subsequent surgery 11/04/2014.   (2) radiation  12/21/2014-01/11/2015:   Left Breast / 42.56 Gy in 16 fractions    (3) anastrozole started September 2016, completing 5 years September 2021              (a) bone density 09/27/2014 shows osteoporosis             (  b) denosumab/Prolia started 05/19/2015, to be repeated every 6 months.             (c) repeat bone density 09/28/2016 showed a T score of -3.3; this was stable     PLAN:  Ms. Hortencia has completed 5 years of antiestrogen therapy but elected to continue with oncology once a year.   Assessment & Plan Osteoporosis Due for Prolia injection. - Schedule Prolia injection in May at a convenient time.  Mammogram screening Due for mammogram. Previous order expired. No new breast symptoms. Dense breasts noted. - Order mammogram. - Provide phone number to schedule mammogram. - Follow up in one year if mammogram is normal.     All questions were answered. The patient knows to call the clinic with any problems, questions or concerns. We can certainly see the patient much sooner if necessary.  Total encounter time:20 minutes*in face-to-face visit time, chart review, lab review, care coordination, order entry, and documentation of the encounter time. *Total Encounter Time as defined by the Centers for Medicare and Medicaid Services includes, in addition to the face-to-face time of a patient visit (documented in the note above) non-face-to-face time: obtaining and reviewing outside history, ordering and reviewing medications, tests or procedures, care coordination (communications with other health care professionals or caregivers) and documentation in the medical record.

## 2023-09-12 NOTE — Assessment & Plan Note (Addendum)
 ASSESSMENT: 89 y.o.  [place] woman status post left breast lower outer quadrant biopsy 10/05/2014 for a clinical T1 N0, stage IA invasive ductal breast cancer, grade 1, estrogen and progesterone receptor positive, HER-2 not amplified, with an MIB-1 of 5%    (1) left lumpectomy and sentinel lymph node sampling 10/22/2014 showed a pT1c pN0, stage IA mixed ductal and mucinous invasive carcinoma, grade 2, repeat HER-2 again negative             (a) positive margins were cleared with subsequent surgery 11/04/2014.   (2) radiation  12/21/2014-01/11/2015:   Left Breast / 42.56 Gy in 16 fractions    (3) anastrozole started September 2016, completing 5 years September 2021             (a) bone density 09/27/2014 shows osteoporosis             (b) denosumab/Prolia started 05/19/2015, to be repeated every 6 months.             (c) repeat bone density 09/28/2016 showed a T score of -3.3; this was stable     PLAN:  [name] has completed 5 years of antiestrogen therapy but elected to continue with oncology once a year.   Assessment & Plan Osteoporosis Due for Prolia injection. - Schedule Prolia injection in May at a convenient time.  Mammogram screening Due for mammogram. Previous order expired. No new breast symptoms. Dense breasts noted. - Order mammogram. - Provide phone number to schedule mammogram. - Follow up in one year if mammogram is normal.

## 2023-09-20 ENCOUNTER — Telehealth: Payer: Self-pay | Admitting: *Deleted

## 2023-09-20 ENCOUNTER — Inpatient Hospital Stay

## 2023-09-20 ENCOUNTER — Other Ambulatory Visit: Payer: Self-pay | Admitting: *Deleted

## 2023-09-20 VITALS — BP 134/74 | HR 69 | Temp 97.8°F | Resp 17

## 2023-09-20 DIAGNOSIS — M810 Age-related osteoporosis without current pathological fracture: Secondary | ICD-10-CM | POA: Diagnosis not present

## 2023-09-20 DIAGNOSIS — M816 Localized osteoporosis [Lequesne]: Secondary | ICD-10-CM

## 2023-09-20 DIAGNOSIS — Z853 Personal history of malignant neoplasm of breast: Secondary | ICD-10-CM | POA: Diagnosis not present

## 2023-09-20 DIAGNOSIS — C50512 Malignant neoplasm of lower-outer quadrant of left female breast: Secondary | ICD-10-CM

## 2023-09-20 DIAGNOSIS — M818 Other osteoporosis without current pathological fracture: Secondary | ICD-10-CM

## 2023-09-20 LAB — CBC WITH DIFFERENTIAL (CANCER CENTER ONLY)
Abs Immature Granulocytes: 0.01 10*3/uL (ref 0.00–0.07)
Basophils Absolute: 0 10*3/uL (ref 0.0–0.1)
Basophils Relative: 1 %
Eosinophils Absolute: 0.1 10*3/uL (ref 0.0–0.5)
Eosinophils Relative: 2 %
HCT: 36 % (ref 36.0–46.0)
Hemoglobin: 11.5 g/dL — ABNORMAL LOW (ref 12.0–15.0)
Immature Granulocytes: 0 %
Lymphocytes Relative: 12 %
Lymphs Abs: 0.5 10*3/uL — ABNORMAL LOW (ref 0.7–4.0)
MCH: 30.6 pg (ref 26.0–34.0)
MCHC: 31.9 g/dL (ref 30.0–36.0)
MCV: 95.7 fL (ref 80.0–100.0)
Monocytes Absolute: 0.4 10*3/uL (ref 0.1–1.0)
Monocytes Relative: 9 %
Neutro Abs: 3.1 10*3/uL (ref 1.7–7.7)
Neutrophils Relative %: 76 %
Platelet Count: 209 10*3/uL (ref 150–400)
RBC: 3.76 MIL/uL — ABNORMAL LOW (ref 3.87–5.11)
RDW: 12.3 % (ref 11.5–15.5)
WBC Count: 4.1 10*3/uL (ref 4.0–10.5)
nRBC: 0 % (ref 0.0–0.2)

## 2023-09-20 LAB — CMP (CANCER CENTER ONLY)
ALT: 15 U/L (ref 0–44)
AST: 21 U/L (ref 15–41)
Albumin: 4.6 g/dL (ref 3.5–5.0)
Alkaline Phosphatase: 34 U/L — ABNORMAL LOW (ref 38–126)
Anion gap: 6 (ref 5–15)
BUN: 52 mg/dL — ABNORMAL HIGH (ref 8–23)
CO2: 26 mmol/L (ref 22–32)
Calcium: 11.5 mg/dL — ABNORMAL HIGH (ref 8.9–10.3)
Chloride: 108 mmol/L (ref 98–111)
Creatinine: 1.71 mg/dL — ABNORMAL HIGH (ref 0.44–1.00)
GFR, Estimated: 29 mL/min — ABNORMAL LOW (ref >60)
Glucose, Bld: 98 mg/dL (ref 70–99)
Potassium: 5.2 mmol/L — ABNORMAL HIGH (ref 3.5–5.1)
Sodium: 140 mmol/L (ref 135–145)
Total Bilirubin: 0.6 mg/dL (ref 0.0–1.2)
Total Protein: 7.7 g/dL (ref 6.5–8.1)

## 2023-09-20 MED ORDER — DENOSUMAB 60 MG/ML ~~LOC~~ SOSY
60.0000 mg | PREFILLED_SYRINGE | Freq: Once | SUBCUTANEOUS | Status: AC
  Administered 2023-09-20: 60 mg via SUBCUTANEOUS
  Filled 2023-09-20: qty 1

## 2023-09-20 NOTE — Telephone Encounter (Signed)
 Called and left below message on pt personal cell below. Advised pt if there are any concerns to call the office

## 2023-09-20 NOTE — Telephone Encounter (Signed)
-----   Message from [place] sent at 09/20/2023  3:02 PM EDT ----- BUN and creatinine are worse compared to 6 months ago, but stable compared to a yr ago. Please ask pt to follow up with PCP.

## 2023-09-23 ENCOUNTER — Other Ambulatory Visit: Payer: Self-pay | Admitting: Pharmacist

## 2023-10-07 ENCOUNTER — Ambulatory Visit: Payer: Self-pay

## 2023-10-07 NOTE — Patient Instructions (Signed)
 Visit Information  Thank you for taking time to visit with me today.   Following is a copy of your care plan:   Goals Addressed             This Visit's Progress    COMPLETED: To continue self-monitoring blood pressures       Care Coordination Interventions: Evaluation of current treatment plan related to hypertension self management and patient's adherence to plan as established by provider Reviewed and discussed with patient her home BP readings, averaging 130/80's or less than Advised patient, providing education and rationale, to monitor blood pressure daily and record, calling PCP for findings outside established parameters Hypertension Interventions: Last practice recorded BP readings:  BP Readings from Last 3 Encounters:  10/07/23 (!) 141/75  09/20/23 134/74  09/12/23 139/79   Most recent eGFR/CrCl:  Lab Results  Component Value Date   EGFR 29.0 10/09/2022    No components found for: "CRCL"     COMPLETED: To improve kidney function       Care Coordination Interventions: Assessed the Patient understanding of chronic kidney disease       Re-educated patient about the importance of staying well hydrated with water, aiming for 48-64 oz daily unless otherwise directed  Assessed for signs and symptoms of orthostatic hypotension Advised patient, providing education and rationale, to monitor blood pressure daily and record, calling PCP for findings outside established parameters    Screening for signs and symptoms of depression related to chronic disease state      Assessed social determinant of health barriers    Provided education on kidney disease progression    Last practice recorded BP readings:  BP Readings from Last 3 Encounters:  10/07/23 (!) 141/75  09/20/23 134/74  09/12/23 139/79   Most recent eGFR/CrCl:  Lab Results  Component Value Date   EGFR 29.0 10/09/2022    No components found for: "CRCL"      COMPLETED: To reschedule lab and office visit with Dr.  Ronelle Coffee       Care Coordination Interventions: Evaluation of current treatment plan related to parathyroid  dysfunction and patient's adherence to plan as established by provider Educated patient regarding the basic disease process related to parathyroidism Counseled patient about the importance of keeping all scheduled lab and follow up visits as directed Instructed patient to keep her doctor informed of new symptoms or concerns       COMPLETED: To self-manage CHF       Care Coordination Interventions: Evaluation of current treatment plan related to CHF and patient's adherence to plan as established by provider Determined patient reports having no current symptoms suggestive of CHF at this time Discussed with patient she is doing daily weights and BP checks Reviewed medications with patient and discussed importance of medication adherence, completed medication reconciliation with no discrepancies noted  Instructed patient to keep her doctor informed of new symptoms or concerns  Advised patient of nurse case closure due to patient is managing her chronic health conditions and staying health           Please call 1-800-273-TALK (toll free, 24 hour hotline) if you are experiencing a Mental Health or Behavioral Health Crisis or need someone to talk to.  Patient verbalizes understanding of instructions and care plan provided today and agrees to view in MyChart. Active MyChart status and patient understanding of how to access instructions and care plan via MyChart confirmed with patient.     Louanne Roussel RN BSN CCM   [facility] Nurse Care Coordinator  Direct Dial: [phone] Website: [email or web address]

## 2023-10-07 NOTE — Patient Outreach (Signed)
 Complex Care Management   Visit Note  10/07/2023  Name:  [name] MRN: [id number] DOB: July 08, 1934  Situation: Referral received for Complex Care Management related to Heart Failure, Atrial Fibrillation, and Hypertension.  I obtained verbal consent from Patient.  Visit completed with patient on the phone.  Background:   Past Medical History:  Diagnosis Date   Anemia    Anxiety    Atrial fibrillation (HCC)    Breast cancer of lower-outer quadrant of left female breast (HCC) 10/11/2014   treated with lumpectomy and radiation   Chronic systolic CHF (congestive heart failure) (HCC)    CKD (chronic kidney disease)    Coronary artery disease 07/27/2017   GERD (gastroesophageal reflux disease)    Hypertension    Mitral regurgitation    Myocardial infarction (HCC) 07/27/2017   STEMI of anterolateral wall. Cath w/ stent and cabg by Dr. Swaziland   Osteoporosis 10/20/2014   Personal history of radiation therapy 2016   treatment of breast cancer   PONV (postoperative nausea and vomiting)    pt denies this.   PUD (peptic ulcer disease)    Pure hypercholesterolemia 04/08/2014   S/P mitral valve clip implantation 05/12/2020   s/p TEER with two NTW Mitraclip devices by Dr. Learta Prost, kidney     Assessment: Patient Reported Symptoms:  Cognitive Cognitive Status: Alert and oriented to person, place, and time Cognitive/Intellectual Conditions Management [RPT]: None reported or documented in medical history or problem list   Health Maintenance Behaviors: Annual physical exam, Healthy diet, Immunizations, Sleep adequate, Hobbies, Social activities, Spiritual practice(s) Healing Pattern: Fast Health Facilitated by: Prayer/meditation, Healthy diet, Rest  Neurological Neurological Review of Symptoms: No symptoms reported    HEENT HEENT Symptoms Reported: No symptoms reported      Cardiovascular Cardiovascular Symptoms Reported: No symptoms reported Does patient have uncontrolled  Hypertension?: No Cardiovascular Conditions: Hypertension, Heart failure, Dysrhythmia Cardiovascular Management Strategies: Medication therapy, Routine screening Weight: 155 lb (70.3 kg) Cardiovascular Self-Management Outcome: 4 (good)  Respiratory Respiratory Symptoms Reported: No symptoms reported    Endocrine Patient reports the following symptoms related to hypoglycemia or hyperglycemia : No symptoms reported Is patient diabetic?: No Endocrine Conditions: Parathyroid  disorder, Osteoporosis Endocrine Management Strategies: Routine screening, Medication therapy Endocrine Self-Management Outcome: 4 (good)  Gastrointestinal Gastrointestinal Symptoms Reported: No symptoms reported   Nutrition Risk Screen (CP): No indicators present  Genitourinary Genitourinary Symptoms Reported: No symptoms reported    Integumentary Integumentary Symptoms Reported: No symptoms reported    Musculoskeletal Musculoskelatal Symptoms Reviewed: No symptoms reported   Falls in the past year?: No Number of falls in past year: 1 or less Was there an injury with Fall?: No Fall Risk Category Calculator: 0 Patient Fall Risk Level: Low Fall Risk Patient at Risk for Falls Due to: No Fall Risks Fall risk Follow up: Falls evaluation completed  Psychosocial Psychosocial Symptoms Reported: No symptoms reported   Major Change/Loss/Stressor/Fears (CP): Denies Techniques to Cope with Loss/Stress/Change: Spiritual practice(s) Quality of Family Relationships: involved, helpful, supportive Do you feel physically threatened by others?: No      10/07/2023   11:20 AM  Depression screen PHQ 2/9  Decreased Interest 0  Down, Depressed, Hopeless 0  PHQ - 2 Score 0    Vitals:   10/07/23 1044  BP: (!) 141/75  Pulse: 68    Medications Reviewed Today     Reviewed by Kaylene Pascal, RN (Registered Nurse) on 10/07/23 at 1104  Med List Status: <None>   Medication Order  Taking? Sig Documenting Provider Last Dose Status  Informant  acetaminophen  (TYLENOL ) 500 MG tablet [id number] Yes Take 500-1,000 mg by mouth every 6 (six) hours as needed (for pain.). [provider] Taking Active Self  amoxicillin  (AMOXIL ) 500 MG tablet [id number] No Take 4 tablets (2,000 mg total) 1 hour prior to all dental visits.  Patient not taking: Reported on 10/07/2023   Ardia Kraft, PA-C Not Taking Active   atorvastatin  (LIPITOR ) 40 MG tablet [id number] No Take 1 tablet (40 mg total) by mouth daily.  Patient not taking: Reported on 10/07/2023   Lawrance Presume, MD Not Taking Active   B-12, Methylcobalamin, 1000 MCG SUBL [id number] Yes Take 1,000 mcg by mouth daily. [provider] Taking Active Self           Med Note (Rosaleigh Brazzel L   Mon Oct 07, 2023 10:56 AM)    BLACK CURRANT SEED OIL PO [id number] Yes Patient takes 1 teaspoon by mouth every night. [provider] Taking Active   Cholecalciferol (VITAMIN D ) 125 MCG (5000 UT) CAPS [id number] Yes Take by mouth in the morning and at bedtime. [provider] Taking Active   Co-Enzyme Q10 100 MG CAPS [id number] Yes Take by mouth 2 (two) times daily. [provider] Taking Active   DHA-EPA-Vitamin E (OMEGA-3 COMPLEX PO) [id number] Yes Take 1 tablet by mouth 2 (two) times daily. [provider] Taking Active   ELIQUIS  2.5 MG TABS tablet [id number] Yes TAKE 1 TABLET BY MOUTH TWICE DAILY . APPOINTMENT REQUIRED FOR FUTURE REFILLS Bensimhon, Rheta Celestine, MD Taking Active   ferrous sulfate 325 (65 FE) MG tablet [id number] Yes Take 325 mg by mouth daily. [provider] Taking Active   furosemide  (LASIX ) 40 MG tablet [id number] No Take 40 mg by mouth as needed.  Patient not taking: Reported on 10/07/2023   [provider] Not Taking Active   [facility] EXTERNAL MEDICATION [id number] Yes daily. Advanced Cholesterol Solution by Dr. Sinatra (orders on line) [provider] Taking Active   latanoprost (XALATAN) 0.005 % ophthalmic  solution [id number] Yes Place 1 drop into both eyes at bedtime. [provider] Taking Active   losartan  (COZAAR ) 25 MG tablet [id number] Yes Take 1 tablet (25 mg total) by mouth 2 (two) times daily. Bensimhon, Rheta Celestine, MD Taking Active   Magnesium  200 MG CHEW [id number] Yes Chew 400 mg by mouth daily. [provider] Taking Consider Medication Status and Discontinue (Change in therapy)   Multiple Vitamin (MULTIVITAMIN WITH MINERALS) TABS tablet [id number] Yes Take 1 tablet by mouth daily. [provider] Taking Active Self           Med Note (Jolee Critcher L   Mon Oct 07, 2023 11:01 AM)    nitroGLYCERIN  (NITROSTAT ) 0.3 MG SL tablet [id number] No DISSOLVE ONE TABLET UNDER THE TONGUE EVERY 5 MINUTES AS NEEDED FOR CHEST PAIN.  DO NOT EXCEED A TOTAL OF 3 DOSES IN 15 MINUTES NOW  Patient not taking: Reported on 10/07/2023   Bensimhon, Rheta Celestine, MD Not Taking Active   OVER THE COUNTER MEDICATION [id number] Yes Take 500 mg by mouth daily at 6 (six) AM. Advanced Cholesterol Solution contains Bergamot 500 mg and Vitamin C  50 mg  Patient orders online [provider] Taking Active   spironolactone  (ALDACTONE ) 25 MG tablet [id number] Yes TAKE 1 TABLET BY MOUTH ONCE DAILY . APPOINTMENT REQUIRED FOR FUTURE REFILLS WITH  THE  HEART  CLINIC Bensimhon, Rheta Celestine, MD Taking Active  Recommendation:   PCP Follow-up  Follow Up Plan:   Closing From:  Complex Care Management  Louanne Roussel RN BSN CCM [facility]  [facility], [facility] Nurse Care Coordinator  Direct Dial: [phone] Website: [email or web address]

## 2023-12-07 ENCOUNTER — Other Ambulatory Visit (HOSPITAL_COMMUNITY): Payer: Self-pay | Admitting: Internal Medicine

## 2023-12-14 ENCOUNTER — Other Ambulatory Visit (HOSPITAL_COMMUNITY): Payer: Self-pay | Admitting: Internal Medicine

## 2023-12-18 ENCOUNTER — Telehealth (HOSPITAL_COMMUNITY): Payer: Self-pay

## 2023-12-18 NOTE — Telephone Encounter (Signed)
 Called to confirm/remind patient of their appointment at the [facility] on 12/19/2023 9:00.   Appointment:   [] Confirmed  [x] Left mess   [] No answer/No voice mail  [] VM Full/unable to leave message  [] Phone not in service  Patient reminded to bring all medications and/or complete list.  Confirmed patient has transportation. Gave directions, instructed to utilize valet parking.

## 2023-12-18 NOTE — Progress Notes (Signed)
 [facility] Note   Date:  12/19/2023   ID:  [name], DOB April 10, 1935, MRN [id number]  Location: Home  Provider location: [place] [facility] Type of Visit: Established patient  PCP:  Loretha Ash, MD  Cardiologist:  None Primary HF: Bensimhon  Chief Complaint: Heart Failure  History of Present Illness:  [name] is a 89 y.o. female  with h/o HTN, breast cancer, CAD s/p anterior STEMI 2/19, chronic AF and systolic HF.    Admitted to [facility] ED 07/27/17 with CP. Hospital course was complicated by acute respiratory failure, bradycardic arrest, Afib RVR, and cardiogenic shock in the setting of anterior MI.  Found to have acute anterior MI and taken urgently to the cath lab. LHC 07/27/17 as below with 3v CAD, LVEF 35-40%, elevated LVEDP. Underwent PCI with successful stenting of the ramus intermediate artery with DES x 2.. As she improved she was extubated on 2/24. An ECHO was completed an showed LVEF 40-45%.      On 06/13/18 she underwent cardioversion for recurrent A fib. She returned to the A fib clinic on 06/19/18 and was back in A Fib. Zio patch to assess AF rate = continuous AF with average rate 84. (was not on carvedilol  at that time)   Echo 10/21 EF 40-45% severe MR. Mod TR. Mild AI.   Underwent MitraClip 05/12/20. Post op echo showed EF 45-50%, with moderate residual MR and mean gradient of 4.56mm Hg.   Echo 11/22: EF 40-45%, RV mildly reduced, RVSP 50 mmHg, moderate residual MR with mean gradient 5 mmHg.  Echo 8/23: EF 40%, RV normal, mod MR, mild-mod TR and severe asymmetric left ventricular hypertrophy of the septal segment.   Of note, she has a h/o hypercalcemia, hypophosphatemia and inappropriately elevated PTH 113: Concerning for primary hyperparathyroidism.  She has had hypercalcemia for a long time. Per PCP documentation, patient declined doing a scan and does not want to do any invasive or surgical procedures.   Today she  returns for HF follow up with her son. Overall feeling fine. Denies SOB/PND/Orthopnea. Appetite ok. No fever or chills. Taking all medications. Rarely takes lasix .   Cath results:  [facility] 07/27/2017  Post Atrio lesion is 90% stenosed. Prox LAD to Dist LAD lesion is 50% stenosed. Ost 1st Diag lesion is 95% stenosed. Ost 2nd Diag to 2nd Diag lesion is 90% stenosed. Mid Cx lesion is 30% stenosed. Ost Ramus to Ramus lesion is 100% stenosed. A drug-eluting stent was successfully placed using a STENT SYNERGY DES 2.75X28. A drug-eluting stent was successfully placed using a STENT SYNERGY DES 2.5X12. Post intervention, there is a 0% residual stenosis. There is moderate to severe left ventricular systolic dysfunction. LV end diastolic pressure is moderately elevated. The left ventricular ejection fraction is 35-45% by visual estimate. 1. 3 vessel obstructive CAD    - 100% ramus intermediate artery. This is a large vessel and is the culprit lesion.    - 95% small first diagonal    - 90% small second diagonal     - diffusely diseased LAD    - 90% PLOM    Past Medical History:  Diagnosis Date   Anemia    Anxiety    Atrial fibrillation (HCC)    Breast cancer of lower-outer quadrant of left female breast (HCC) 10/11/2014   treated with lumpectomy and radiation   Chronic systolic CHF (congestive heart failure) (HCC)    CKD (chronic kidney disease)  Coronary artery disease 07/27/2017   GERD (gastroesophageal reflux disease)    Hypertension    Mitral regurgitation    Myocardial infarction (HCC) 07/27/2017   STEMI of anterolateral wall. Cath w/ stent and cabg by Dr. Swaziland   Osteoporosis 10/20/2014   Personal history of radiation therapy 2016   treatment of breast cancer   PONV (postoperative nausea and vomiting)    pt denies this.   PUD (peptic ulcer disease)    Pure hypercholesterolemia 04/08/2014   S/P mitral valve clip implantation 05/12/2020   s/p TEER with two NTW Mitraclip devices by  Dr. Wonda Dustman, kidney    Past Surgical History:  Procedure Laterality Date   BREAST LUMPECTOMY Left 2016   BREAST LUMPECTOMY WITH SENTINEL LYMPH NODE BIOPSY Left 10/22/2014   Procedure: BREAST LUMPECTOMY WITH SENTINEL LYMPH NODE BIOPSY;  Surgeon: Deward Null III, MD;  Location: [facility];  Service: General;  Laterality: Left;   CARDIAC CATHETERIZATION     CARDIOVERSION N/A 06/13/2018   Procedure: CARDIOVERSION;  Surgeon: Cherrie Toribio SAUNDERS, MD;  Location: [facility] ENDOSCOPY;  Service: Cardiovascular;  Laterality: N/A;   CORONARY STENT INTERVENTION N/A 07/27/2017   Procedure: CORONARY STENT INTERVENTION;  Surgeon: Swaziland, Peter M, MD;  Location: [facility] INVASIVE CV LAB;  Service: Cardiovascular;  Laterality: N/A;   CORONARY/GRAFT ACUTE MI REVASCULARIZATION N/A 07/27/2017   Procedure: Coronary/Graft Acute MI Revascularization;  Surgeon: Swaziland, Peter M, MD;  Location[facility] INVASIVE CV LAB;  Service: Cardiovascular;  Laterality: N/A;   LEFT HEART CATH AND CORONARY ANGIOGRAPHY N/A 07/27/2017   Procedure: LEFT HEART CATH AND CORONARY ANGIOGRAPHY;  Surgeon: Swaziland, Peter M, MD;  Location: [facility] INVASIVE CV LAB;  Service: Cardiovascular;  Laterality: N/A;   RE-EXCISION OF BREAST CANCER,SUPERIOR MARGINS Left 11/04/2014   Procedure: RE-EXCISION OF LEFT BREAST INFERIOR MARGINS;  Surgeon: Deward Null III, MD;  Location: [facility] OR;  Service: General;  Laterality: Left;   RIGHT HEART CATH N/A 04/05/2020   Procedure: RIGHT HEART CATH;  Surgeon: Cherrie Toribio SAUNDERS, MD;  Location: [facility] INVASIVE CV LAB;  Service: Cardiovascular;  Laterality: N/A;   TEE WITHOUT CARDIOVERSION N/A 04/05/2020   Procedure: TRANSESOPHAGEAL ECHOCARDIOGRAM (TEE);  Surgeon: Cherrie Toribio SAUNDERS, MD;  Location: [facility] ENDOSCOPY;  Service: Cardiovascular;  Laterality: N/A;   TEE WITHOUT CARDIOVERSION N/A 05/12/2020   Procedure: TRANSESOPHAGEAL ECHOCARDIOGRAM (TEE);  Surgeon: Wonda Sharper, MD;  Location: [facility] INVASIVE CV LAB;  Service: Cardiovascular;   Laterality: N/A;   TRANSCATHETER MITRAL EDGE TO EDGE REPAIR N/A 05/12/2020   Procedure: MITRAL VALVE REPAIR;  Surgeon: Wonda Sharper, MD;  Location: [facility] INVASIVE CV LAB;  Service: Cardiovascular;  Laterality: N/A;     Current Outpatient Medications  Medication Sig Dispense Refill   acetaminophen  (TYLENOL ) 500 MG tablet Take 500-1,000 mg by mouth every 6 (six) hours as needed (for pain.).     amoxicillin  (AMOXIL ) 500 MG tablet Take 4 tablets (2,000 mg total) 1 hour prior to all dental visits. 8 tablet 11   B-12, Methylcobalamin, 1000 MCG SUBL Take 1,000 mcg by mouth daily.     BLACK CURRANT SEED OIL PO Patient takes 1 teaspoon by mouth every night.     Cholecalciferol (VITAMIN D ) 125 MCG (5000 UT) CAPS Take by mouth in the morning and at bedtime.     Co-Enzyme Q10 100 MG CAPS Take by mouth 2 (two) times daily.     DHA-EPA-Vitamin E (OMEGA-3 COMPLEX PO) Take 1 tablet by mouth 2 (two) times daily.     ELIQUIS   2.5 MG TABS tablet TAKE 1 TABLET BY MOUTH TWICE DAILY. APPOINTMENT REQUIRED FOR FUTURE REFILLS. 180 tablet 0   ferrous sulfate 325 (65 FE) MG tablet Take 325 mg by mouth daily.     furosemide  (LASIX ) 40 MG tablet Take 40 mg by mouth as needed.     GENERIC EXTERNAL MEDICATION daily. Advanced Cholesterol Solution by Dr. Sinatra (orders on line)     latanoprost (XALATAN) 0.005 % ophthalmic solution Place 1 drop into both eyes at bedtime.     losartan  (COZAAR ) 25 MG tablet Take 1 tablet (25 mg total) by mouth 2 (two) times daily. 180 tablet 3   Magnesium  200 MG TABS Take 2 tablets by mouth daily.     Multiple Vitamin (MULTIVITAMIN WITH MINERALS) TABS tablet Take 1 tablet by mouth daily.     nitroGLYCERIN  (NITROSTAT ) 0.3 MG SL tablet DISSOLVE ONE TABLET UNDER THE TONGUE EVERY 5 MINUTES AS NEEDED FOR CHEST PAIN.  DO NOT EXCEED A TOTAL OF 3 DOSES IN 15 MINUTES NOW 100 tablet 0   OVER THE COUNTER MEDICATION Take 500 mg by mouth daily at 6 (six) AM. Advanced Cholesterol Solution contains Bergamot  500 mg and Vitamin C  50 mg  Patient orders online     spironolactone  (ALDACTONE ) 25 MG tablet TAKE 1 TABLET BY MOUTH ONCE DAILY . APPOINTMENT REQUIRED FOR FUTURE REFILLS WITH  THE  HEART  CLINIC 90 tablet 3   atorvastatin  (LIPITOR ) 40 MG tablet Take 1 tablet (40 mg total) by mouth daily. (Patient not taking: Reported on 12/19/2023) 90 tablet 1   No current facility-administered medications for this encounter.    Allergies:   Lisinopril    Social History:  The patient  reports that she has never smoked. She has never used smokeless tobacco. She reports that she does not drink alcohol and does not use drugs.   Family History:  The patient's family history includes Heart disease in her brother; Hypertension in her brother, brother, sister, and sister; Pancreatic cancer in her mother; Stroke in her father.   ROS:  Please see the history of present illness.   All other systems are personally reviewed and negative.   Vitals:   12/19/23 0914  BP: 128/70  Pulse: 74  SpO2: 100%  Weight: 69.3 kg (152 lb 12.8 oz)   Wt Readings from Last 3 Encounters:  12/19/23 69.3 kg (152 lb 12.8 oz)  10/07/23 70.3 kg (155 lb)  09/12/23 68.8 kg (151 lb 11.2 oz)      Physical Exam  General:   Walked in the clinic.  Neck: supple. no JVD.  Cor: Irregular rate & rhythm. Lungs: clear Abdomen: soft, nontender, nondistended.  Extremities: no  edema Neuro: alert & oriented x3  EKG:  A fib 90 bpm  Recent Labs: 03/07/2023: Magnesium  2.1 09/20/2023: ALT 15; BUN 52; Creatinine 1.71; Hemoglobin 11.5; Platelet Count 209; Potassium 5.2; Sodium 140  Personally reviewed   Wt Readings from Last 3 Encounters:  12/19/23 69.3 kg (152 lb 12.8 oz)  10/07/23 70.3 kg (155 lb)  09/12/23 68.8 kg (151 lb 11.2 oz)      ASSESSMENT AND PLAN:  1. CAD - 3V CAD with STEMI 07/2017 S/P DES x2 to Ramus - No chest pain.  - no ASA w/ Eliquis  use  - Intolerant lowest dose of coreg . - on atorvastatin  40 mg. Refill today.      2. Chronic Systolic Heart Failure, ICM   Echo 12/2017 EF 45-50%  - Echo 9/20 EF ~40-45% 3+ MR -  Echo 03/18/20 EF 40-45% severe MR - Underwent MitraClip 05/12/20. Post op echo showed EF 45-50%, with moderate residual MR and mean gradient of 4.3mm Hg. - Echo 11/22: EF 40-45%, RV mildly reduced, RVSP 50 mmHg, moderate residual MR with mean gradient 5 mmHg. - Echo 8/23: EF 40%, RV normal, mod MR w/ mG 5 mmHg, mild-mod TR + severe asymmetric left ventricular hypertrophy of the septal segment.  - Ischemic CM but ? Infiltrative process such as amyloid also given severe LVH on echo, chronic afib, CKD and chronic hypercalcemia. Per PCP documentation, patient has had hypercalcemia for a long time and declined doing a scan and does not want to do any invasive or surgical procedures.  Myeloma panel negative.  -NYHA I. Appears euvolemic. Continue lasix  as needed.  - Volume status  - Takes Lasix  40 mg PRN . Refill today  - Continue losartan  25 mg twice a day.  Refill today.  - Continue 25 mg spironolactone  daily . Refill today. - Failed carvedilol  due to fatigue - SGLT2i previously deferred given GFR ~20  -Check bmet   3. Permanent AF -S/P DC-CV 06/13/2018 to NSR but back in A fib on 1/16  - Remains in A fib. She was intolerant amio due to nausea and tremors.  - Dr Kelsie had discussed PPM insertion, then uptitration of BB. She has deferred this procedure for now. No need for PPM currently.  - Zio 9/21 shows avg rate 84bpm. Intolerant of carvedilol  3.125 (failed 2x) - Continue eliquis  2.5 mg BID. Refill eliquis .   4. Mitral regurgitation - s/p MitraClip 12/21 - most recent echo 8/23: mod MR w/ mG 5 mmHg - Aware of SBE prophylaxis  5. HTN -Stable.    6.. CKD IV - Creatinine baseline 2.1-2.3 (GFR 21) - Follows with nephrology, [facility]   Follow up in 1 year with Dr Cherrie.     Greig Mosses, NP  9:34 AM   [facility] [address] [facility]  [place] [address] [phone] (office) [phone] (fax)

## 2023-12-19 ENCOUNTER — Ambulatory Visit (HOSPITAL_COMMUNITY)
Admission: RE | Admit: 2023-12-19 | Discharge: 2023-12-19 | Disposition: A | Discharge status: Home / Self Care | Source: Ambulatory Visit | Attending: Adult Health | Admitting: Adult Health

## 2023-12-19 ENCOUNTER — Ambulatory Visit (HOSPITAL_COMMUNITY): Payer: Self-pay | Admitting: Adult Health

## 2023-12-19 ENCOUNTER — Encounter (HOSPITAL_COMMUNITY): Payer: Self-pay

## 2023-12-19 VITALS — BP 128/70 | HR 74 | Wt 152.8 lb

## 2023-12-19 DIAGNOSIS — I130 Hypertensive heart and chronic kidney disease with heart failure and stage 1 through stage 4 chronic kidney disease, or unspecified chronic kidney disease: Secondary | ICD-10-CM | POA: Insufficient documentation

## 2023-12-19 DIAGNOSIS — I340 Nonrheumatic mitral (valve) insufficiency: Secondary | ICD-10-CM | POA: Insufficient documentation

## 2023-12-19 DIAGNOSIS — I4821 Permanent atrial fibrillation: Secondary | ICD-10-CM | POA: Insufficient documentation

## 2023-12-19 DIAGNOSIS — I482 Chronic atrial fibrillation, unspecified: Secondary | ICD-10-CM | POA: Diagnosis not present

## 2023-12-19 DIAGNOSIS — Z853 Personal history of malignant neoplasm of breast: Secondary | ICD-10-CM | POA: Diagnosis not present

## 2023-12-19 DIAGNOSIS — I252 Old myocardial infarction: Secondary | ICD-10-CM | POA: Insufficient documentation

## 2023-12-19 DIAGNOSIS — I10 Essential (primary) hypertension: Secondary | ICD-10-CM | POA: Diagnosis not present

## 2023-12-19 DIAGNOSIS — I255 Ischemic cardiomyopathy: Secondary | ICD-10-CM | POA: Insufficient documentation

## 2023-12-19 DIAGNOSIS — Z955 Presence of coronary angioplasty implant and graft: Secondary | ICD-10-CM | POA: Insufficient documentation

## 2023-12-19 DIAGNOSIS — N184 Chronic kidney disease, stage 4 (severe): Secondary | ICD-10-CM | POA: Diagnosis not present

## 2023-12-19 DIAGNOSIS — I5022 Chronic systolic (congestive) heart failure: Secondary | ICD-10-CM | POA: Diagnosis not present

## 2023-12-19 DIAGNOSIS — I2510 Atherosclerotic heart disease of native coronary artery without angina pectoris: Secondary | ICD-10-CM | POA: Diagnosis not present

## 2023-12-19 DIAGNOSIS — I110 Hypertensive heart disease with heart failure: Secondary | ICD-10-CM | POA: Diagnosis present

## 2023-12-19 MED ORDER — APIXABAN 2.5 MG PO TABS: 2.5000 mg | ORAL_TABLET | Freq: Two times a day (BID) | ORAL | 3 refills | Status: AC

## 2023-12-19 MED ORDER — SPIRONOLACTONE 25 MG PO TABS: 25.0000 mg | ORAL_TABLET | Freq: Every day | ORAL | 3 refills | Status: AC

## 2023-12-19 MED ORDER — LOSARTAN POTASSIUM 25 MG PO TABS: 25.0000 mg | ORAL_TABLET | Freq: Two times a day (BID) | ORAL | 3 refills | Status: AC

## 2023-12-19 MED ORDER — ATORVASTATIN CALCIUM 40 MG PO TABS: 40.0000 mg | ORAL_TABLET | Freq: Every day | ORAL | 3 refills | Status: AC

## 2023-12-19 NOTE — Patient Instructions (Signed)
 Medication Changes:  MEDICATIONS REFILLED   Follow-Up in: 1 YEAR WITH DR. CHERRIE PLEASE CALL OUR OFFICE AROUND JUNE OF 2026 TO GET SCHEDULED FOR YOUR APPOINTMENT. PHONE NUMBER IS [phone] OPTION 2   At the [facility], you and your health needs are our priority. We have a designated team specialized in the treatment of Heart Failure. This Care Team includes your primary Heart Failure Specialized Cardiologist (physician), Advanced Practice Providers (APPs- Physician Assistants and Nurse Practitioners), and Pharmacist who all work together to provide you with the care you need, when you need it.   You may see any of the following providers on your designated Care Team at your next follow up:  Dr. Toribio CHERRIE Dr. Ezra Shuck Dr. Ria Commander Dr. Odis Brownie Greig Mosses, NP Caffie Shed, [place] [facility] [place], [place] Beckey Coe, NP [place] Lee, NP Tinnie Redman, PharmD   Please be sure to bring in all your medications bottles to every appointment.   Need to Contact Us :  If you have any questions or concerns before your next appointment please send us  a message through [place] or call our office at [phone].    TO LEAVE A MESSAGE FOR THE NURSE SELECT OPTION 2, PLEASE LEAVE A MESSAGE INCLUDING: YOUR NAME DATE OF BIRTH CALL BACK NUMBER REASON FOR CALL**this is important as we prioritize the call backs  YOU WILL RECEIVE A CALL BACK THE SAME DAY AS LONG AS YOU CALL BEFORE 4:00 PM

## 2024-01-10 ENCOUNTER — Telehealth (HOSPITAL_COMMUNITY): Payer: Self-pay | Admitting: Cardiology

## 2024-01-10 NOTE — Telephone Encounter (Signed)
Pt aware.

## 2024-01-10 NOTE — Telephone Encounter (Signed)
 Patient called to make sure its ok to use vibration machine with HF.  Reports she ordered a machine on TV and wanted to be sure it ok for heart.  Unable to describe usage or device, or details.   -please advise

## 2024-01-24 ENCOUNTER — Other Ambulatory Visit (HOSPITAL_COMMUNITY): Payer: Self-pay | Admitting: Pharmacist

## 2024-01-24 MED ORDER — FUROSEMIDE 40 MG PO TABS: 40.0000 mg | ORAL_TABLET | Freq: Every day | ORAL | 3 refills | Status: AC | PRN

## 2024-03-13 ENCOUNTER — Inpatient Hospital Stay

## 2024-03-19 ENCOUNTER — Inpatient Hospital Stay

## 2024-03-20 ENCOUNTER — Inpatient Hospital Stay

## 2024-03-20 ENCOUNTER — Other Ambulatory Visit: Payer: Self-pay | Admitting: *Deleted

## 2024-03-20 DIAGNOSIS — C50512 Malignant neoplasm of lower-outer quadrant of left female breast: Secondary | ICD-10-CM

## 2024-03-20 DIAGNOSIS — M816 Localized osteoporosis [Lequesne]: Secondary | ICD-10-CM

## 2024-03-23 ENCOUNTER — Other Ambulatory Visit: Payer: Self-pay | Admitting: Hematology and Oncology

## 2024-03-23 ENCOUNTER — Inpatient Hospital Stay

## 2024-03-23 ENCOUNTER — Inpatient Hospital Stay: Attending: Hematology and Oncology

## 2024-03-23 VITALS — BP 157/89 | HR 84 | Temp 97.8°F | Resp 16

## 2024-03-23 DIAGNOSIS — M818 Other osteoporosis without current pathological fracture: Secondary | ICD-10-CM

## 2024-03-23 DIAGNOSIS — Z853 Personal history of malignant neoplasm of breast: Secondary | ICD-10-CM | POA: Diagnosis not present

## 2024-03-23 DIAGNOSIS — M810 Age-related osteoporosis without current pathological fracture: Secondary | ICD-10-CM | POA: Diagnosis present

## 2024-03-23 DIAGNOSIS — Z170 Estrogen receptor positive status [ER+]: Secondary | ICD-10-CM

## 2024-03-23 DIAGNOSIS — M816 Localized osteoporosis [Lequesne]: Secondary | ICD-10-CM

## 2024-03-23 LAB — CBC WITH DIFFERENTIAL (CANCER CENTER ONLY)
Abs Immature Granulocytes: 0.01 K/uL (ref 0.00–0.07)
Basophils Absolute: 0.1 K/uL (ref 0.0–0.1)
Basophils Relative: 1 %
Eosinophils Absolute: 0.1 K/uL (ref 0.0–0.5)
Eosinophils Relative: 2 %
HCT: 35.8 % — ABNORMAL LOW (ref 36.0–46.0)
Hemoglobin: 11.5 g/dL — ABNORMAL LOW (ref 12.0–15.0)
Immature Granulocytes: 0 %
Lymphocytes Relative: 15 %
Lymphs Abs: 0.7 K/uL (ref 0.7–4.0)
MCH: 30.7 pg (ref 26.0–34.0)
MCHC: 32.1 g/dL (ref 30.0–36.0)
MCV: 95.5 fL (ref 80.0–100.0)
Monocytes Absolute: 0.4 K/uL (ref 0.1–1.0)
Monocytes Relative: 8 %
Neutro Abs: 3.2 K/uL (ref 1.7–7.7)
Neutrophils Relative %: 74 %
Platelet Count: 198 K/uL (ref 150–400)
RBC: 3.75 MIL/uL — ABNORMAL LOW (ref 3.87–5.11)
RDW: 13.2 % (ref 11.5–15.5)
WBC Count: 4.4 K/uL (ref 4.0–10.5)
nRBC: 0 % (ref 0.0–0.2)

## 2024-03-23 LAB — CMP (CANCER CENTER ONLY)
ALT: 17 U/L (ref 0–44)
AST: 22 U/L (ref 15–41)
Albumin: 4.6 g/dL (ref 3.5–5.0)
Alkaline Phosphatase: 36 U/L — ABNORMAL LOW (ref 38–126)
Anion gap: 6 (ref 5–15)
BUN: 33 mg/dL — ABNORMAL HIGH (ref 8–23)
CO2: 27 mmol/L (ref 22–32)
Calcium: 12 mg/dL — ABNORMAL HIGH (ref 8.9–10.3)
Chloride: 106 mmol/L (ref 98–111)
Creatinine: 1.63 mg/dL — ABNORMAL HIGH (ref 0.44–1.00)
GFR, Estimated: 31 mL/min — ABNORMAL LOW (ref >60)
Glucose, Bld: 93 mg/dL (ref 70–99)
Potassium: 5.1 mmol/L (ref 3.5–5.1)
Sodium: 139 mmol/L (ref 135–145)
Total Bilirubin: 0.9 mg/dL (ref 0.0–1.2)
Total Protein: 7.8 g/dL (ref 6.5–8.1)

## 2024-03-23 MED ORDER — DENOSUMAB 60 MG/ML ~~LOC~~ SOSY
60.0000 mg | PREFILLED_SYRINGE | Freq: Once | SUBCUTANEOUS | Status: AC
  Administered 2024-03-23: 60 mg via SUBCUTANEOUS
  Filled 2024-03-23: qty 1

## 2024-07-21 ENCOUNTER — Ambulatory Visit: Payer: Medicare Other

## 2024-09-11 ENCOUNTER — Inpatient Hospital Stay: Admitting: Hematology and Oncology
